# Patient Record
Sex: Male | Born: 1935 | Race: White | Hispanic: No | Smoking: Never smoker
Health system: Southern US, Community
[De-identification: ages and names within clinical notes are randomized; demographics above are authoritative.]

## PROBLEM LIST (undated history)

## (undated) DIAGNOSIS — R0600 Dyspnea, unspecified: Secondary | ICD-10-CM

## (undated) DIAGNOSIS — M169 Osteoarthritis of hip, unspecified: Secondary | ICD-10-CM

## (undated) DIAGNOSIS — N200 Calculus of kidney: Secondary | ICD-10-CM

## (undated) DIAGNOSIS — Z9289 Personal history of other medical treatment: Secondary | ICD-10-CM

## (undated) DIAGNOSIS — B029 Zoster without complications: Secondary | ICD-10-CM

## (undated) DIAGNOSIS — T8859XA Other complications of anesthesia, initial encounter: Secondary | ICD-10-CM

## (undated) DIAGNOSIS — M545 Low back pain, unspecified: Secondary | ICD-10-CM

## (undated) DIAGNOSIS — S32010A Wedge compression fracture of first lumbar vertebra, initial encounter for closed fracture: Secondary | ICD-10-CM

## (undated) DIAGNOSIS — E119 Type 2 diabetes mellitus without complications: Secondary | ICD-10-CM

## (undated) DIAGNOSIS — K56600 Partial intestinal obstruction, unspecified as to cause: Secondary | ICD-10-CM

## (undated) DIAGNOSIS — K5190 Ulcerative colitis, unspecified, without complications: Secondary | ICD-10-CM

## (undated) DIAGNOSIS — Z933 Colostomy status: Secondary | ICD-10-CM

## (undated) DIAGNOSIS — I480 Paroxysmal atrial fibrillation: Secondary | ICD-10-CM

## (undated) DIAGNOSIS — I5032 Chronic diastolic (congestive) heart failure: Secondary | ICD-10-CM

## (undated) DIAGNOSIS — M8580 Other specified disorders of bone density and structure, unspecified site: Secondary | ICD-10-CM

## (undated) DIAGNOSIS — N289 Disorder of kidney and ureter, unspecified: Secondary | ICD-10-CM

## (undated) DIAGNOSIS — K259 Gastric ulcer, unspecified as acute or chronic, without hemorrhage or perforation: Secondary | ICD-10-CM

## (undated) DIAGNOSIS — D649 Anemia, unspecified: Secondary | ICD-10-CM

## (undated) DIAGNOSIS — N183 Chronic kidney disease, stage 3 unspecified: Secondary | ICD-10-CM

## (undated) DIAGNOSIS — E669 Obesity, unspecified: Secondary | ICD-10-CM

## (undated) DIAGNOSIS — T7840XA Allergy, unspecified, initial encounter: Secondary | ICD-10-CM

## (undated) DIAGNOSIS — I10 Essential (primary) hypertension: Secondary | ICD-10-CM

## (undated) DIAGNOSIS — I4892 Unspecified atrial flutter: Secondary | ICD-10-CM

## (undated) DIAGNOSIS — Z87448 Personal history of other diseases of urinary system: Secondary | ICD-10-CM

## (undated) DIAGNOSIS — T4145XA Adverse effect of unspecified anesthetic, initial encounter: Secondary | ICD-10-CM

## (undated) DIAGNOSIS — K219 Gastro-esophageal reflux disease without esophagitis: Secondary | ICD-10-CM

## (undated) DIAGNOSIS — I493 Ventricular premature depolarization: Secondary | ICD-10-CM

## (undated) DIAGNOSIS — E785 Hyperlipidemia, unspecified: Secondary | ICD-10-CM

## (undated) DIAGNOSIS — Z87442 Personal history of urinary calculi: Secondary | ICD-10-CM

## (undated) DIAGNOSIS — I420 Dilated cardiomyopathy: Secondary | ICD-10-CM

## (undated) DIAGNOSIS — Z8711 Personal history of peptic ulcer disease: Secondary | ICD-10-CM

## (undated) DIAGNOSIS — H3530 Unspecified macular degeneration: Secondary | ICD-10-CM

## (undated) DIAGNOSIS — I2510 Atherosclerotic heart disease of native coronary artery without angina pectoris: Secondary | ICD-10-CM

## (undated) HISTORY — DX: Atherosclerotic heart disease of native coronary artery without angina pectoris: I25.10

## (undated) HISTORY — PX: TONSILLECTOMY: SUR1361

## (undated) HISTORY — DX: Ulcerative colitis, unspecified, without complications: K51.90

## (undated) HISTORY — DX: Anemia, unspecified: D64.9

## (undated) HISTORY — PX: COLON RESECTION: SHX5231

## (undated) HISTORY — DX: Calculus of kidney: N20.0

## (undated) HISTORY — DX: Personal history of other diseases of urinary system: Z87.448

## (undated) HISTORY — DX: Gastric ulcer, unspecified as acute or chronic, without hemorrhage or perforation: K25.9

## (undated) HISTORY — DX: Obesity, unspecified: E66.9

## (undated) HISTORY — DX: Low back pain, unspecified: M54.50

## (undated) HISTORY — PX: BACK SURGERY: SHX140

## (undated) HISTORY — DX: Paroxysmal atrial fibrillation: I48.0

## (undated) HISTORY — DX: Dilated cardiomyopathy: I42.0

## (undated) HISTORY — DX: Gastro-esophageal reflux disease without esophagitis: K21.9

## (undated) HISTORY — DX: Type 2 diabetes mellitus without complications: E11.9

## (undated) HISTORY — DX: Chronic kidney disease, stage 3 unspecified: N18.30

## (undated) HISTORY — DX: Unspecified macular degeneration: H35.30

## (undated) HISTORY — DX: Osteoarthritis of hip, unspecified: M16.9

## (undated) HISTORY — DX: Wedge compression fracture of first lumbar vertebra, initial encounter for closed fracture: S32.010A

## (undated) HISTORY — DX: Partial intestinal obstruction, unspecified as to cause: K56.600

## (undated) HISTORY — PX: COLOSTOMY: SHX63

## (undated) HISTORY — DX: Personal history of peptic ulcer disease: Z87.11

## (undated) HISTORY — PX: CORONARY ANGIOPLASTY: SHX604

## (undated) HISTORY — DX: Unspecified atrial flutter: I48.92

## (undated) HISTORY — PX: HERNIA REPAIR: SHX51

## (undated) HISTORY — DX: Zoster without complications: B02.9

## (undated) HISTORY — DX: Low back pain: M54.5

## (undated) HISTORY — DX: Other specified disorders of bone density and structure, unspecified site: M85.80

## (undated) HISTORY — DX: Chronic kidney disease, stage 3 (moderate): N18.3

## (undated) HISTORY — DX: Hyperlipidemia, unspecified: E78.5

## (undated) HISTORY — DX: Essential (primary) hypertension: I10

## (undated) HISTORY — DX: Ventricular premature depolarization: I49.3

## (undated) HISTORY — DX: Chronic diastolic (congestive) heart failure: I50.32

## (undated) HISTORY — DX: Disorder of kidney and ureter, unspecified: N28.9

---

## 2000-05-14 ENCOUNTER — Inpatient Hospital Stay (HOSPITAL_COMMUNITY): Admission: EM | Admit: 2000-05-14 | Discharge: 2000-05-16 | Payer: Self-pay | Admitting: *Deleted

## 2003-10-28 ENCOUNTER — Emergency Department (HOSPITAL_COMMUNITY): Admission: AD | Admit: 2003-10-28 | Discharge: 2003-10-28 | Payer: Self-pay | Admitting: Family Medicine

## 2007-10-18 DIAGNOSIS — K56600 Partial intestinal obstruction, unspecified as to cause: Secondary | ICD-10-CM

## 2007-10-18 HISTORY — DX: Partial intestinal obstruction, unspecified as to cause: K56.600

## 2008-02-15 DIAGNOSIS — N200 Calculus of kidney: Secondary | ICD-10-CM

## 2008-02-15 HISTORY — DX: Calculus of kidney: N20.0

## 2008-03-02 ENCOUNTER — Ambulatory Visit: Payer: Self-pay | Admitting: Internal Medicine

## 2008-03-02 ENCOUNTER — Inpatient Hospital Stay (HOSPITAL_COMMUNITY): Admission: EM | Admit: 2008-03-02 | Discharge: 2008-03-05 | Payer: Self-pay | Admitting: Emergency Medicine

## 2008-03-04 ENCOUNTER — Encounter: Payer: Self-pay | Admitting: Cardiology

## 2008-03-05 ENCOUNTER — Encounter (INDEPENDENT_AMBULATORY_CARE_PROVIDER_SITE_OTHER): Payer: Self-pay | Admitting: Urology

## 2008-05-22 ENCOUNTER — Inpatient Hospital Stay (HOSPITAL_COMMUNITY): Admission: EM | Admit: 2008-05-22 | Discharge: 2008-05-28 | Payer: Self-pay | Admitting: *Deleted

## 2008-05-22 ENCOUNTER — Ambulatory Visit: Payer: Self-pay | Admitting: Internal Medicine

## 2008-05-28 ENCOUNTER — Encounter: Payer: Self-pay | Admitting: Internal Medicine

## 2008-06-18 ENCOUNTER — Ambulatory Visit: Payer: Self-pay | Admitting: Internal Medicine

## 2008-06-18 LAB — CONVERTED CEMR LAB
BUN: 31 mg/dL — ABNORMAL HIGH (ref 6–23)
Basophils Absolute: 0.1 10*3/uL (ref 0.0–0.1)
Basophils Relative: 1.5 % (ref 0.0–3.0)
CO2: 26 meq/L (ref 19–32)
Calcium: 9.2 mg/dL (ref 8.4–10.5)
Chloride: 110 meq/L (ref 96–112)
Creatinine, Ser: 1.4 mg/dL (ref 0.4–1.5)
Eosinophils Absolute: 0.5 10*3/uL (ref 0.0–0.7)
Eosinophils Relative: 5.9 % — ABNORMAL HIGH (ref 0.0–5.0)
GFR calc Af Amer: 64 mL/min
GFR calc non Af Amer: 53 mL/min
Glucose, Bld: 78 mg/dL (ref 70–99)
HCT: 34.3 % — ABNORMAL LOW (ref 39.0–52.0)
Hemoglobin: 11.8 g/dL — ABNORMAL LOW (ref 13.0–17.0)
INR: 2 — ABNORMAL HIGH (ref 0.8–1.0)
Lymphocytes Relative: 17.7 % (ref 12.0–46.0)
MCHC: 34.3 g/dL (ref 30.0–36.0)
MCV: 88.6 fL (ref 78.0–100.0)
Monocytes Absolute: 0.7 10*3/uL (ref 0.1–1.0)
Monocytes Relative: 8.1 % (ref 3.0–12.0)
Neutro Abs: 5.4 10*3/uL (ref 1.4–7.7)
Neutrophils Relative %: 66.8 % (ref 43.0–77.0)
Platelets: 339 10*3/uL (ref 150–400)
Potassium: 4.7 meq/L (ref 3.5–5.1)
Prothrombin Time: 22.4 s — ABNORMAL HIGH (ref 10.9–13.3)
RBC: 3.87 M/uL — ABNORMAL LOW (ref 4.22–5.81)
RDW: 15.1 % — ABNORMAL HIGH (ref 11.5–14.6)
Sodium: 138 meq/L (ref 135–145)
WBC: 8.2 10*3/uL (ref 4.5–10.5)
aPTT: 48.6 s — ABNORMAL HIGH (ref 21.7–29.8)

## 2008-06-26 ENCOUNTER — Ambulatory Visit: Payer: Self-pay | Admitting: Internal Medicine

## 2008-06-26 ENCOUNTER — Inpatient Hospital Stay (HOSPITAL_COMMUNITY): Admission: AD | Admit: 2008-06-26 | Discharge: 2008-06-27 | Payer: Self-pay | Admitting: Internal Medicine

## 2008-08-07 ENCOUNTER — Ambulatory Visit: Payer: Self-pay | Admitting: Internal Medicine

## 2008-10-05 ENCOUNTER — Encounter: Admission: RE | Admit: 2008-10-05 | Discharge: 2008-10-05 | Payer: Self-pay | Admitting: Internal Medicine

## 2008-10-15 ENCOUNTER — Encounter: Admission: RE | Admit: 2008-10-15 | Discharge: 2008-10-15 | Payer: Self-pay | Admitting: Internal Medicine

## 2008-10-23 ENCOUNTER — Encounter: Admission: RE | Admit: 2008-10-23 | Discharge: 2008-10-23 | Payer: Self-pay | Admitting: Internal Medicine

## 2010-01-02 ENCOUNTER — Inpatient Hospital Stay (HOSPITAL_COMMUNITY): Admission: EM | Admit: 2010-01-02 | Discharge: 2010-01-07 | Payer: Self-pay | Admitting: Pulmonary Disease

## 2010-01-02 ENCOUNTER — Encounter: Payer: Self-pay | Admitting: Emergency Medicine

## 2010-01-02 ENCOUNTER — Ambulatory Visit: Payer: Self-pay | Admitting: Emergency Medicine

## 2010-04-05 ENCOUNTER — Encounter: Admission: RE | Admit: 2010-04-05 | Discharge: 2010-04-05 | Payer: Self-pay | Admitting: Internal Medicine

## 2010-11-16 NOTE — Procedures (Signed)
Summary: Endoscopy   EGD  Procedure date:  05/27/2008  Findings:      Location: [facility]    Patient Name: [name] [name], Study MRN: [id number] Procedure Procedures: Panendoscopy (EGD) CPT: 07615.  Personnel: Endoscopist: Gatha Mayer, MD, [facility].  Exam Location: Exam performed in Endoscopy Suite.  Patient Consent: Procedure, Alternatives, Risks and Benefits discussed, consent obtained, from patient. Consent was obtained by the RN.  Indications  Evaluation of: Anemia,  with low iron saturation. Positive fecal occult blood test  History  Current Medications: Patient is currently taking Coumadin. AC Plan: Continue Coumadin.  Comments: Heparin held Is being anticoagulated for Afib/Flutter Pre-Exam Physical: Performed May 27, 2008  Cardio-pulmonary exam abnormal. HEENT exam, Abdominal exam, Mental status exam WNL. Abnormal PE findings include: Afib/fluter.  Comments: Pt. history reviewed/updated, physical exam performed prior to initiation of sedation? YES Exam Exam Info: Maximum depth of insertion Duodenum, intended Duodenum. Patient position: on left side. Gastric retroflexion performed. Images taken. ASA Classification: III.  Sedation Meds: Patient assessed and found to be appropriate for moderate (conscious) sedation. Fentanyl 50 mcg. given IV. Versed 4 mg. given IV. Cetacaine Spray 2 sprays given aerosolized.  Monitoring: BP and pulse monitoring done. Oximetry used. Supplemental O2 given  Fluoroscopy: Fluoroscopy was not used.  Findings - Normal: Proximal Esophagus to Duodenal 2nd Portion.   Assessment  Comments: NORMAL EGD NO CAUSE OF HEME + STOOL OR ANEMIA SEEN SUSPECT ANEMIA MULTIFACTORIAL, CHRONIC DISEASE AND IRON DEFICIENCY Events  Unplanned Intervention: No unplanned interventions were required.  Plans Comments: IRON SUPPLEMENTS Disposition: After procedure patient sent to recovery.  Scheduling: Follow-up prn.  Comments: IF THERE  IS EVIDENCE OF GI HEMORRHAGE OR FAILS TO RESPOND TO IRON A CAPSULE ENDOSCOPY OF SMALL BOWEL COULD BE NECESSARY BUT WOULD NOT DO AT THIS TIME   cc: [name], MD [facility]     [name], MD   This report was created from the original endoscopy report, which was reviewed and signed by the above listed endoscopist.

## 2011-01-07 LAB — CULTURE, BLOOD (ROUTINE X 2)
Culture: NO GROWTH
Culture: NO GROWTH

## 2011-01-07 LAB — DIFFERENTIAL
Basophils Absolute: 0 10*3/uL (ref 0.0–0.1)
Basophils Relative: 0 % (ref 0–1)
Eosinophils Absolute: 1.1 10*3/uL — ABNORMAL HIGH (ref 0.0–0.7)
Eosinophils Relative: 7 % — ABNORMAL HIGH (ref 0–5)
Lymphocytes Relative: 10 % — ABNORMAL LOW (ref 12–46)
Lymphs Abs: 1.7 10*3/uL (ref 0.7–4.0)
Monocytes Absolute: 1.2 10*3/uL — ABNORMAL HIGH (ref 0.1–1.0)
Monocytes Relative: 7 % (ref 3–12)
Neutro Abs: 12.4 10*3/uL — ABNORMAL HIGH (ref 1.7–7.7)
Neutrophils Relative %: 76 % (ref 43–77)

## 2011-01-07 LAB — BLOOD GAS, ARTERIAL
Acid-base deficit: 12.7 mmol/L — ABNORMAL HIGH (ref 0.0–2.0)
Bicarbonate: 12.9 mEq/L — ABNORMAL LOW (ref 20.0–24.0)
O2 Content: 2 L/min
O2 Saturation: 98 %
Patient temperature: 98.6
TCO2: 12.2 mmol/L (ref 0–100)
pCO2 arterial: 29.8 mmHg — ABNORMAL LOW (ref 35.0–45.0)
pH, Arterial: 7.259 — ABNORMAL LOW (ref 7.350–7.450)
pO2, Arterial: 115 mmHg — ABNORMAL HIGH (ref 80.0–100.0)

## 2011-01-07 LAB — POCT CARDIAC MARKERS
CKMB, poc: 1.1 ng/mL (ref 1.0–8.0)
CKMB, poc: 1.2 ng/mL (ref 1.0–8.0)
Myoglobin, poc: >500 ng/mL (ref 12–200)
Myoglobin, poc: >500 ng/mL (ref 12–200)
Troponin i, poc: <0.05 ng/mL (ref 0.00–0.09)
Troponin i, poc: <0.05 ng/mL (ref 0.00–0.09)

## 2011-01-07 LAB — BASIC METABOLIC PANEL
BUN: 83 mg/dL — ABNORMAL HIGH (ref 6–23)
CO2: 16 mEq/L — ABNORMAL LOW (ref 19–32)
Calcium: 9 mg/dL (ref 8.4–10.5)
Chloride: 109 mEq/L (ref 96–112)
Creatinine, Ser: 8.53 mg/dL — ABNORMAL HIGH (ref 0.4–1.5)
GFR calc Af Amer: 7 mL/min — ABNORMAL LOW (ref >60)
GFR calc non Af Amer: 6 mL/min — ABNORMAL LOW (ref >60)
Glucose, Bld: 103 mg/dL — ABNORMAL HIGH (ref 70–99)
Potassium: 5.9 mEq/L — ABNORMAL HIGH (ref 3.5–5.1)
Sodium: 135 mEq/L (ref 135–145)

## 2011-01-07 LAB — URINE CULTURE
Colony Count: NO GROWTH
Culture: NO GROWTH

## 2011-01-07 LAB — CBC
HCT: 38.1 % — ABNORMAL LOW (ref 39.0–52.0)
Hemoglobin: 12.5 g/dL — ABNORMAL LOW (ref 13.0–17.0)
MCHC: 32.9 g/dL (ref 30.0–36.0)
MCV: 88.2 fL (ref 78.0–100.0)
Platelets: 404 10*3/uL — ABNORMAL HIGH (ref 150–400)
RBC: 4.32 MIL/uL (ref 4.22–5.81)
RDW: 14.3 % (ref 11.5–15.5)
WBC: 16.4 10*3/uL — ABNORMAL HIGH (ref 4.0–10.5)

## 2011-01-07 LAB — POCT I-STAT, CHEM 8
BUN: 92 mg/dL — ABNORMAL HIGH (ref 6–23)
Calcium, Ion: 1.25 mmol/L (ref 1.12–1.32)
Chloride: 112 mEq/L (ref 96–112)
Creatinine, Ser: 9.5 mg/dL — ABNORMAL HIGH (ref 0.4–1.5)
Glucose, Bld: 212 mg/dL — ABNORMAL HIGH (ref 70–99)
HCT: 41 % (ref 39.0–52.0)
Hemoglobin: 13.9 g/dL (ref 13.0–17.0)
Potassium: 6.2 mEq/L — ABNORMAL HIGH (ref 3.5–5.1)
Sodium: 132 mEq/L — ABNORMAL LOW (ref 135–145)
TCO2: 15 mmol/L (ref 0–100)

## 2011-01-07 LAB — HEPATIC FUNCTION PANEL
ALT: 12 U/L (ref 0–53)
AST: 17 U/L (ref 0–37)
Albumin: 3.4 g/dL — ABNORMAL LOW (ref 3.5–5.2)
Alkaline Phosphatase: 102 U/L (ref 39–117)
Bilirubin, Direct: 0.1 mg/dL (ref 0.0–0.3)
Indirect Bilirubin: 0.4 mg/dL (ref 0.3–0.9)
Total Bilirubin: 0.5 mg/dL (ref 0.3–1.2)
Total Protein: 8.1 g/dL (ref 6.0–8.3)

## 2011-01-07 LAB — URINALYSIS, ROUTINE W REFLEX MICROSCOPIC
Glucose, UA: 100 mg/dL — AB
Nitrite: NEGATIVE
Protein, ur: >300 mg/dL — AB
Specific Gravity, Urine: 1.023 (ref 1.005–1.030)
Urobilinogen, UA: 0.2 mg/dL (ref 0.0–1.0)
pH: 5.5 (ref 5.0–8.0)

## 2011-01-07 LAB — TYPE AND SCREEN
ABO/RH(D): A POS
Antibody Screen: NEGATIVE

## 2011-01-07 LAB — GLUCOSE, CAPILLARY
Glucose-Capillary: 104 mg/dL — ABNORMAL HIGH (ref 70–99)
Glucose-Capillary: 223 mg/dL — ABNORMAL HIGH (ref 70–99)

## 2011-01-07 LAB — PROTIME-INR
INR: 1.18 (ref 0.00–1.49)
Prothrombin Time: 14.9 seconds (ref 11.6–15.2)

## 2011-01-07 LAB — LACTIC ACID, PLASMA
Lactic Acid, Venous: 3.2 mmol/L — ABNORMAL HIGH (ref 0.5–2.2)
Lactic Acid, Venous: 3.3 mmol/L — ABNORMAL HIGH (ref 0.5–2.2)

## 2011-01-07 LAB — MAGNESIUM: Magnesium: 2.5 mg/dL (ref 1.5–2.5)

## 2011-01-07 LAB — URINE MICROSCOPIC-ADD ON

## 2011-01-07 LAB — ABO/RH: ABO/RH(D): A POS

## 2011-01-07 LAB — POTASSIUM: Potassium: 6.3 mEq/L (ref 3.5–5.1)

## 2011-01-09 LAB — GLUCOSE, CAPILLARY
Glucose-Capillary: 103 mg/dL — ABNORMAL HIGH (ref 70–99)
Glucose-Capillary: 103 mg/dL — ABNORMAL HIGH (ref 70–99)
Glucose-Capillary: 105 mg/dL — ABNORMAL HIGH (ref 70–99)
Glucose-Capillary: 111 mg/dL — ABNORMAL HIGH (ref 70–99)
Glucose-Capillary: 123 mg/dL — ABNORMAL HIGH (ref 70–99)
Glucose-Capillary: 133 mg/dL — ABNORMAL HIGH (ref 70–99)
Glucose-Capillary: 134 mg/dL — ABNORMAL HIGH (ref 70–99)
Glucose-Capillary: 142 mg/dL — ABNORMAL HIGH (ref 70–99)
Glucose-Capillary: 150 mg/dL — ABNORMAL HIGH (ref 70–99)
Glucose-Capillary: 156 mg/dL — ABNORMAL HIGH (ref 70–99)
Glucose-Capillary: 173 mg/dL — ABNORMAL HIGH (ref 70–99)
Glucose-Capillary: 173 mg/dL — ABNORMAL HIGH (ref 70–99)
Glucose-Capillary: 182 mg/dL — ABNORMAL HIGH (ref 70–99)
Glucose-Capillary: 190 mg/dL — ABNORMAL HIGH (ref 70–99)
Glucose-Capillary: 192 mg/dL — ABNORMAL HIGH (ref 70–99)
Glucose-Capillary: 211 mg/dL — ABNORMAL HIGH (ref 70–99)
Glucose-Capillary: 214 mg/dL — ABNORMAL HIGH (ref 70–99)
Glucose-Capillary: 242 mg/dL — ABNORMAL HIGH (ref 70–99)
Glucose-Capillary: 353 mg/dL — ABNORMAL HIGH (ref 70–99)
Glucose-Capillary: 50 mg/dL — ABNORMAL LOW (ref 70–99)
Glucose-Capillary: 83 mg/dL (ref 70–99)

## 2011-01-09 LAB — POCT I-STAT 3, ART BLOOD GAS (G3+)
Acid-Base Excess: 4 mmol/L — ABNORMAL HIGH (ref 0.0–2.0)
Acid-Base Excess: 7 mmol/L — ABNORMAL HIGH (ref 0.0–2.0)
Acid-base deficit: 12 mmol/L — ABNORMAL HIGH (ref 0.0–2.0)
Bicarbonate: 14.4 mEq/L — ABNORMAL LOW (ref 20.0–24.0)
Bicarbonate: 24 mEq/L (ref 20.0–24.0)
Bicarbonate: 28 mEq/L — ABNORMAL HIGH (ref 20.0–24.0)
Bicarbonate: 30.2 mEq/L — ABNORMAL HIGH (ref 20.0–24.0)
O2 Saturation: 96 %
O2 Saturation: 96 %
O2 Saturation: 96 %
O2 Saturation: 98 %
Patient temperature: 97.5
Patient temperature: 98.1
Patient temperature: 98.6
TCO2: 15 mmol/L (ref 0–100)
TCO2: 25 mmol/L (ref 0–100)
TCO2: 29 mmol/L (ref 0–100)
TCO2: 31 mmol/L (ref 0–100)
pCO2 arterial: 33.5 mmHg — ABNORMAL LOW (ref 35.0–45.0)
pCO2 arterial: 35.1 mmHg (ref 35.0–45.0)
pCO2 arterial: 35.3 mmHg (ref 35.0–45.0)
pCO2 arterial: 36.9 mmHg (ref 35.0–45.0)
pH, Arterial: 7.242 — ABNORMAL LOW (ref 7.350–7.450)
pH, Arterial: 7.44 (ref 7.350–7.450)
pH, Arterial: 7.487 — ABNORMAL HIGH (ref 7.350–7.450)
pH, Arterial: 7.54 — ABNORMAL HIGH (ref 7.350–7.450)
pO2, Arterial: 125 mmHg — ABNORMAL HIGH (ref 80.0–100.0)
pO2, Arterial: 72 mmHg — ABNORMAL LOW (ref 80.0–100.0)
pO2, Arterial: 75 mmHg — ABNORMAL LOW (ref 80.0–100.0)
pO2, Arterial: 78 mmHg — ABNORMAL LOW (ref 80.0–100.0)

## 2011-01-09 LAB — CARDIAC PANEL(CRET KIN+CKTOT+MB+TROPI)
CK, MB: 1.4 ng/mL (ref 0.3–4.0)
Relative Index: INVALID (ref 0.0–2.5)
Total CK: 72 U/L (ref 7–232)
Troponin I: 0.02 ng/mL (ref 0.00–0.06)

## 2011-01-09 LAB — CBC
HCT: 27.2 % — ABNORMAL LOW (ref 39.0–52.0)
HCT: 28 % — ABNORMAL LOW (ref 39.0–52.0)
HCT: 29.9 % — ABNORMAL LOW (ref 39.0–52.0)
HCT: 30.9 % — ABNORMAL LOW (ref 39.0–52.0)
HCT: 32.6 % — ABNORMAL LOW (ref 39.0–52.0)
Hemoglobin: 10.3 g/dL — ABNORMAL LOW (ref 13.0–17.0)
Hemoglobin: 10.6 g/dL — ABNORMAL LOW (ref 13.0–17.0)
Hemoglobin: 11 g/dL — ABNORMAL LOW (ref 13.0–17.0)
Hemoglobin: 9.1 g/dL — ABNORMAL LOW (ref 13.0–17.0)
Hemoglobin: 9.5 g/dL — ABNORMAL LOW (ref 13.0–17.0)
MCHC: 33.5 g/dL (ref 30.0–36.0)
MCHC: 33.7 g/dL (ref 30.0–36.0)
MCHC: 34 g/dL (ref 30.0–36.0)
MCHC: 34.2 g/dL (ref 30.0–36.0)
MCHC: 34.4 g/dL (ref 30.0–36.0)
MCV: 87.4 fL (ref 78.0–100.0)
MCV: 87.4 fL (ref 78.0–100.0)
MCV: 87.4 fL (ref 78.0–100.0)
MCV: 88.3 fL (ref 78.0–100.0)
MCV: 88.5 fL (ref 78.0–100.0)
Platelets: 169 10*3/uL (ref 150–400)
Platelets: 186 10*3/uL (ref 150–400)
Platelets: 198 10*3/uL (ref 150–400)
Platelets: 211 10*3/uL (ref 150–400)
Platelets: 228 10*3/uL (ref 150–400)
RBC: 3.11 MIL/uL — ABNORMAL LOW (ref 4.22–5.81)
RBC: 3.21 MIL/uL — ABNORMAL LOW (ref 4.22–5.81)
RBC: 3.43 MIL/uL — ABNORMAL LOW (ref 4.22–5.81)
RBC: 3.5 MIL/uL — ABNORMAL LOW (ref 4.22–5.81)
RBC: 3.69 MIL/uL — ABNORMAL LOW (ref 4.22–5.81)
RDW: 13.6 % (ref 11.5–15.5)
RDW: 13.8 % (ref 11.5–15.5)
RDW: 13.9 % (ref 11.5–15.5)
RDW: 13.9 % (ref 11.5–15.5)
RDW: 14 % (ref 11.5–15.5)
WBC: 11.6 10*3/uL — ABNORMAL HIGH (ref 4.0–10.5)
WBC: 12.3 10*3/uL — ABNORMAL HIGH (ref 4.0–10.5)
WBC: 14.2 10*3/uL — ABNORMAL HIGH (ref 4.0–10.5)
WBC: 14.6 10*3/uL — ABNORMAL HIGH (ref 4.0–10.5)
WBC: 8.8 10*3/uL (ref 4.0–10.5)

## 2011-01-09 LAB — RENAL FUNCTION PANEL
Albumin: 2 g/dL — ABNORMAL LOW (ref 3.5–5.2)
Albumin: 2.1 g/dL — ABNORMAL LOW (ref 3.5–5.2)
Albumin: 2.3 g/dL — ABNORMAL LOW (ref 3.5–5.2)
Albumin: 2.4 g/dL — ABNORMAL LOW (ref 3.5–5.2)
Albumin: 2.5 g/dL — ABNORMAL LOW (ref 3.5–5.2)
BUN: 19 mg/dL (ref 6–23)
BUN: 23 mg/dL (ref 6–23)
BUN: 36 mg/dL — ABNORMAL HIGH (ref 6–23)
BUN: 48 mg/dL — ABNORMAL HIGH (ref 6–23)
BUN: 51 mg/dL — ABNORMAL HIGH (ref 6–23)
CO2: 22 mEq/L (ref 19–32)
CO2: 22 mEq/L (ref 19–32)
CO2: 25 mEq/L (ref 19–32)
CO2: 29 mEq/L (ref 19–32)
CO2: 30 mEq/L (ref 19–32)
Calcium: 6.7 mg/dL — ABNORMAL LOW (ref 8.4–10.5)
Calcium: 6.7 mg/dL — ABNORMAL LOW (ref 8.4–10.5)
Calcium: 6.8 mg/dL — ABNORMAL LOW (ref 8.4–10.5)
Calcium: 7 mg/dL — ABNORMAL LOW (ref 8.4–10.5)
Calcium: 7.2 mg/dL — ABNORMAL LOW (ref 8.4–10.5)
Chloride: 104 mEq/L (ref 96–112)
Chloride: 106 mEq/L (ref 96–112)
Chloride: 108 mEq/L (ref 96–112)
Chloride: 93 mEq/L — ABNORMAL LOW (ref 96–112)
Chloride: 97 mEq/L (ref 96–112)
Creatinine, Ser: 1.89 mg/dL — ABNORMAL HIGH (ref 0.4–1.5)
Creatinine, Ser: 2.26 mg/dL — ABNORMAL HIGH (ref 0.4–1.5)
Creatinine, Ser: 3.67 mg/dL — ABNORMAL HIGH (ref 0.4–1.5)
Creatinine, Ser: 5.92 mg/dL — ABNORMAL HIGH (ref 0.4–1.5)
Creatinine, Ser: 6.26 mg/dL — ABNORMAL HIGH (ref 0.4–1.5)
GFR calc Af Amer: 11 mL/min — ABNORMAL LOW (ref >60)
GFR calc Af Amer: 11 mL/min — ABNORMAL LOW (ref >60)
GFR calc Af Amer: 20 mL/min — ABNORMAL LOW (ref >60)
GFR calc Af Amer: 34 mL/min — ABNORMAL LOW (ref >60)
GFR calc Af Amer: 42 mL/min — ABNORMAL LOW (ref >60)
GFR calc non Af Amer: 16 mL/min — ABNORMAL LOW (ref >60)
GFR calc non Af Amer: 29 mL/min — ABNORMAL LOW (ref >60)
GFR calc non Af Amer: 35 mL/min — ABNORMAL LOW (ref >60)
GFR calc non Af Amer: 9 mL/min — ABNORMAL LOW (ref >60)
GFR calc non Af Amer: 9 mL/min — ABNORMAL LOW (ref >60)
Glucose, Bld: 126 mg/dL — ABNORMAL HIGH (ref 70–99)
Glucose, Bld: 127 mg/dL — ABNORMAL HIGH (ref 70–99)
Glucose, Bld: 137 mg/dL — ABNORMAL HIGH (ref 70–99)
Glucose, Bld: 143 mg/dL — ABNORMAL HIGH (ref 70–99)
Glucose, Bld: 205 mg/dL — ABNORMAL HIGH (ref 70–99)
Phosphorus: 2.8 mg/dL (ref 2.3–4.6)
Phosphorus: 2.8 mg/dL (ref 2.3–4.6)
Phosphorus: 3 mg/dL (ref 2.3–4.6)
Phosphorus: 3.7 mg/dL (ref 2.3–4.6)
Phosphorus: 3.8 mg/dL (ref 2.3–4.6)
Potassium: 3.9 mEq/L (ref 3.5–5.1)
Potassium: 4 mEq/L (ref 3.5–5.1)
Potassium: 4.1 mEq/L (ref 3.5–5.1)
Potassium: 4.3 mEq/L (ref 3.5–5.1)
Potassium: 4.5 mEq/L (ref 3.5–5.1)
Sodium: 134 mEq/L — ABNORMAL LOW (ref 135–145)
Sodium: 136 mEq/L (ref 135–145)
Sodium: 137 mEq/L (ref 135–145)
Sodium: 138 mEq/L (ref 135–145)
Sodium: 139 mEq/L (ref 135–145)

## 2011-01-09 LAB — BASIC METABOLIC PANEL
BUN: 43 mg/dL — ABNORMAL HIGH (ref 6–23)
BUN: 48 mg/dL — ABNORMAL HIGH (ref 6–23)
BUN: 59 mg/dL — ABNORMAL HIGH (ref 6–23)
BUN: 77 mg/dL — ABNORMAL HIGH (ref 6–23)
CO2: 16 mEq/L — ABNORMAL LOW (ref 19–32)
CO2: 24 mEq/L (ref 19–32)
CO2: 27 mEq/L (ref 19–32)
CO2: 32 mEq/L (ref 19–32)
Calcium: 6.8 mg/dL — ABNORMAL LOW (ref 8.4–10.5)
Calcium: 6.9 mg/dL — ABNORMAL LOW (ref 8.4–10.5)
Calcium: 7.2 mg/dL — ABNORMAL LOW (ref 8.4–10.5)
Calcium: 8.4 mg/dL (ref 8.4–10.5)
Chloride: 103 mEq/L (ref 96–112)
Chloride: 108 mEq/L (ref 96–112)
Chloride: 93 mEq/L — ABNORMAL LOW (ref 96–112)
Chloride: 97 mEq/L (ref 96–112)
Creatinine, Ser: 5.06 mg/dL — ABNORMAL HIGH (ref 0.4–1.5)
Creatinine, Ser: 6 mg/dL — ABNORMAL HIGH (ref 0.4–1.5)
Creatinine, Ser: 6.66 mg/dL — ABNORMAL HIGH (ref 0.4–1.5)
Creatinine, Ser: 8.58 mg/dL — ABNORMAL HIGH (ref 0.4–1.5)
GFR calc Af Amer: 10 mL/min — ABNORMAL LOW (ref >60)
GFR calc Af Amer: 11 mL/min — ABNORMAL LOW (ref >60)
GFR calc Af Amer: 14 mL/min — ABNORMAL LOW (ref >60)
GFR calc Af Amer: 7 mL/min — ABNORMAL LOW (ref >60)
GFR calc non Af Amer: 11 mL/min — ABNORMAL LOW (ref >60)
GFR calc non Af Amer: 6 mL/min — ABNORMAL LOW (ref >60)
GFR calc non Af Amer: 8 mL/min — ABNORMAL LOW (ref >60)
GFR calc non Af Amer: 9 mL/min — ABNORMAL LOW (ref >60)
Glucose, Bld: 105 mg/dL — ABNORMAL HIGH (ref 70–99)
Glucose, Bld: 172 mg/dL — ABNORMAL HIGH (ref 70–99)
Glucose, Bld: 81 mg/dL (ref 70–99)
Glucose, Bld: 92 mg/dL (ref 70–99)
Potassium: 3.6 mEq/L (ref 3.5–5.1)
Potassium: 4 mEq/L (ref 3.5–5.1)
Potassium: 4.1 mEq/L (ref 3.5–5.1)
Potassium: 6 mEq/L — ABNORMAL HIGH (ref 3.5–5.1)
Sodium: 136 mEq/L (ref 135–145)
Sodium: 137 mEq/L (ref 135–145)
Sodium: 137 mEq/L (ref 135–145)
Sodium: 139 mEq/L (ref 135–145)

## 2011-01-09 LAB — BRAIN NATRIURETIC PEPTIDE: Pro B Natriuretic peptide (BNP): 583 pg/mL — ABNORMAL HIGH (ref 0.0–100.0)

## 2011-01-09 LAB — MAGNESIUM
Magnesium: 1.2 mg/dL — ABNORMAL LOW (ref 1.5–2.5)
Magnesium: 1.4 mg/dL — ABNORMAL LOW (ref 1.5–2.5)
Magnesium: 1.5 mg/dL (ref 1.5–2.5)

## 2011-01-09 LAB — CLOSTRIDIUM DIFFICILE EIA: C difficile Toxins A+B, EIA: NEGATIVE

## 2011-01-09 LAB — BLOOD GAS, ARTERIAL
Acid-base deficit: 12.5 mmol/L — ABNORMAL HIGH (ref 0.0–2.0)
Bicarbonate: 13.8 mEq/L — ABNORMAL LOW (ref 20.0–24.0)
O2 Content: 2 L/min
O2 Saturation: 97.2 %
Patient temperature: 98.6
TCO2: 14.9 mmol/L (ref 0–100)
pCO2 arterial: 35.8 mmHg (ref 35.0–45.0)
pH, Arterial: 7.211 — ABNORMAL LOW (ref 7.350–7.450)
pO2, Arterial: 101 mmHg — ABNORMAL HIGH (ref 80.0–100.0)

## 2011-01-09 LAB — HEPATIC FUNCTION PANEL
ALT: 9 U/L (ref 0–53)
AST: 13 U/L (ref 0–37)
Albumin: 2.2 g/dL — ABNORMAL LOW (ref 3.5–5.2)
Alkaline Phosphatase: 76 U/L (ref 39–117)
Bilirubin, Direct: 0.2 mg/dL (ref 0.0–0.3)
Indirect Bilirubin: 0.3 mg/dL (ref 0.3–0.9)
Total Bilirubin: 0.5 mg/dL (ref 0.3–1.2)
Total Protein: 5.2 g/dL — ABNORMAL LOW (ref 6.0–8.3)

## 2011-01-09 LAB — APTT
aPTT: 100 seconds — ABNORMAL HIGH (ref 24–37)
aPTT: 48 seconds — ABNORMAL HIGH (ref 24–37)
aPTT: 57 seconds — ABNORMAL HIGH (ref 24–37)

## 2011-01-09 LAB — LACTIC ACID, PLASMA
Lactic Acid, Venous: 1.5 mmol/L (ref 0.5–2.2)
Lactic Acid, Venous: 3.1 mmol/L — ABNORMAL HIGH (ref 0.5–2.2)
Lactic Acid, Venous: 3.6 mmol/L — ABNORMAL HIGH (ref 0.5–2.2)

## 2011-01-09 LAB — MRSA PCR SCREENING: MRSA by PCR: POSITIVE — AB

## 2011-03-01 NOTE — Consult Note (Signed)
NAME:  [name] [name], [name] [name] NO.:  [id number]   MEDICAL RECORD NO.:  [id number]          PATIENT TYPE:  EMS   LOCATION:  MAJO                         FACILITY:  [place]   PHYSICIAN:  Mare Loan, MD      DATE OF BIRTH:  Jun 26, 1933   DATE OF CONSULTATION:  03/01/2008  DATE OF DISCHARGE:                                 CONSULTATION   REQUESTING PHYSICIAN:  [name] [name], [place]:  Abdominal pain, partial small-bowel  obstruction.   HISTORY OF PRESENT ILLNESS:  [name] [name] is a 77 year old male who has had  onset of generalized abdominal pain around 11 o'clock today.  He states  this is over his entire abdomen.  He had no aggravating or alleviating  factors.  He has been receiving medicines in the emergency room.  He had  no increase of pain with ambulation.  He did eat lunch today, and then  began having abdominal discomfort.  No emesis, but he did have nausea at  home.  He describes having one previous episode of obstructive-type  symptoms in the past; however, they alleviated themselves over on their  own accord.  He also is noted by his sister, who is present in the room,  to have some abdominal distention.  He also is stating to have pain in  the suprapubic region, in his bilateral groin area.  A KUB was done in  the emergency department, which revealed several small bowel loops,  which were dilated with air-fluid levels.  He reported not having any  gas or melanotic stools in ostomy bag from earlier today.   PAST MEDICAL HISTORY:  Significant for diabetes, gastroesophageal reflux  disease, coronary artery disease, history of gastritis, and history of  ulcerative colitis.   SURGICAL HISTORY:  He has had 2 surgeries due to his colon in the past.  Last one was in 1995 by Dr. Johnathan Hausen.   MEDICATIONS:  Include Actos, lisinopril, simvastatin, glyburide, and  metformin.   ALLERGIES:  Include PENICILLIN and SULFA.   REVIEW OF SYSTEMS:  Per  the patient's chart, no history of recent chest  pain, chest discomfort, or shortness of breath.  He is having no more  difficulty with urination, although he has a slow stream.  Neurological  system is negative.  No known chronic kidney problems.   SOCIAL HISTORY:  No alcohol, no drug abuse, and no tobacco use.  He  lives and takes care of his wife who is dependent on his care.   PHYSICAL EXAMINATION:  GENERAL:  He is obese, appears in his stated age  lying in bed.  VITAL SIGNS:  Temperature 97.1, blood pressure 148/73, and pulse 57.  HEENT:  Extraocular muscles are intact.  No scleral icterus is evident.  CHEST:  Clear to auscultation bilaterally.  CARDIOVASCULAR:  Regular rate and rhythm.  ABDOMEN:  Severely obese and soft.  It is difficult to ascertain,  whether or not there is distention.  His incision is without evidence of  a hernia.  The ostomy is pink and viable in  the right side of the  abdomen.  I did digitally manipulate the ostomy at the bedside with my  fifth digit.  This was rather tight, I was unable to reach the passage  due to the patient's body habitus.  No peritoneal signs on palpation and  he has had mild amount of stool in the ostomy bag.  There is abundant  adipose tissues in the groin region, but no evident hernia.   LABORATORY DATA:  White count 11.0, hemoglobin and hematocrit 11.9 and  75, BUN and creatinine 33 and 1.7, and potassium is 4.8.   ASSESSMENT/PLAN:  Partial small bowel obstruction with some air-fluid  level as noted on KUB.  Currently, there is no acute surgical issue.  I  will recommend medicine and admission with continuation of a surgical  consult.  If he does continue to have emesis or nausea, I will recommend  NG-tube for decompression.  Ambulate as much as possible.  His  hyperglycemia and diabetes needs to be controlled.  There is an issue of  possible chronic renal insufficiency due to increase in creatinine  level.  He is not aware of  any problems in the past with this.  We will  continue to follow with abdominal exam, and hopefully this will resolve  of its own accord.      Mare Loan, MD  Electronically Signed     ALA/MEDQ  D:  03/01/2008  T:  03/02/2008  Job:  [id number]   cc:   [name], MD  Isabel Caprice Hassell Done, MD

## 2011-03-01 NOTE — Assessment & Plan Note (Signed)
[name] [name]   [name] [name], [name] [name]                        MRN:          [id number]  DATE:08/07/2008                            DOB:          1933-03-01    [name] is seen following catheter ablation of atrial flutter.  Since  that time, he has had much improved sleeping and much less dyspnea on  exertion, although this is still somewhat limited.   His medications are notable for the intercurrent discontinuation of  Cardizem as well as warfarin.   PHYSICAL EXAMINATION:  VITAL SIGNS:  Today, his blood pressure is  100/60.  His pulse is 63.  His weight is 248.  LUNGS:  Clear.  HEART:  Sounds were regular.  EXTREMITIES:  Without edema.   Electrocardiogram demonstrated sinus rhythm with a rare PVC.  There is  an incomplete right bundle-branch block with 0.17/0.10/0.44.  The axis  was leftward at -20.   IMPRESSION:  1. Atrial flutter, status post ablation.  2. Normal left ventricular function with diastolic heart failure -      improved.  3. Diabetes.   [name] is stable.  I will plan to see him again at Dr. Theodosia Blender  request.     Deboraha Sprang, MD, [facility]  Electronically Signed    SCK/MedQ  DD: 08/07/2008  DT: 08/08/2008  Job #: [id number]

## 2011-03-01 NOTE — H&P (Signed)
NAME:  [name] [name], [name] [name] NO.:  [id number]   MEDICAL RECORD NO.:  [id number]          PATIENT TYPE:  EMS   LOCATION:  MAJO                         FACILITY:  [facility]   PHYSICIAN:  Cindie Laroche, MD      DATE OF BIRTH:  June 24, 1933   DATE OF ADMISSION:  03/01/2008  DATE OF DISCHARGE:                              HISTORY & PHYSICAL   PRIMARY CARE PHYSICIAN:  [name], MD   CHIEF COMPLAINT:  Abdominal pain, nausea.   HISTORY OF PRESENT ILLNESS:  Mr. Bonsall is 77 year old gentleman.  He  lives with his wife.  He has a history of ulcerative colitis status post  colectomy and colostomy bag placement about 25 years ago.  He started  having abdominal pain diffusely located over the upper and lower part of  the abdomen since this morning after he ate popcorn for breakfast.  His  abdominal pain is deep and feels like bloating in the abdomen.  Abdominal pain does not get worse or relieved with any factors like food  intake or bowel movements.  He also feels like he does not have any  bowel movements or any fluid collection in colostomy bag since this  morning.  He did not feel passing gas since this morning.  He has  associated nausea since this morning with abdominal pain.  He did not  have any vomiting or diarrhea.  He denied any fever, chills, shortness  of breath, chest pain, sputum or cough production and urinary  complaints.  He has a history of her small bowel obstruction many years  ago, about three to four episodes but no recent episodes in the last few  years.   PAST MEDICAL HISTORY:  1. Hypertension.  2. Diabetes mellitus type 2.  3. Gastroesophageal reflux disease.  4. Gastritis.  5. Coronary artery disease.  6. Colectomy  7. Colostomy bag.  8. Herniorrhaphy x3.  9. Back surgery.  10.Obesity.  11.Tonsillectomy.   SURGICAL HISTORY:  As per past medical history above.   SOCIAL HISTORY:  The patient lives in [place] with his wife.  No  recent alcohol,  drug, tobacco use.   FAMILY HISTORY:  Positive for coronary artery disease and diabetes  mellitus.   HOME MEDICATIONS:  1. Glyburide 10 mg b.i.d.  2. Metformin 1000 mg b.i.d.  3 . Simvastatin 20 mg daily.  1. Lisinopril 5 mg daily.  2. Actos 45 mg daily.   ALLERGIES:  UNKNOWN ALLERGY TO PENICILLIN, SULFUR DRUGS.   REVIEW OF SYSTEMS:  Positive as per HPI, otherwise negative.  Review of  systems done for 14 systems.   PHYSICAL EXAMINATION:  VITAL SIGNS:  Blood pressure 160/82, pulse 71,  respirations 16, temperature 97.4, oxygen saturation 97% on room air.  GENERAL:  Alert, oriented x3, laying in bed, not in acute distress.  HEENT:  Head normocephalic, nontraumatic.  Eyes: Pupils equally reactive  to light and accommodation.  Oral cavity, oral mucosa dry.  No thrush  noted.  NECK:  No thyromegaly or JVD.  CARDIOVASCULAR:  S1-S2 regular.  No murmur, rub, gallop.  RESPIRATORY SYSTEM:  Clear to auscultation bilaterally.  No wheezing,  rhonchi, crackles.  ABDOMEN:  Reduced bowel sounds present all over the abdomen.  Deep  tenderness present.  No superficial tenderness present.  No guarding, no  rigidity, no organomegaly noted.  Colostomy bag in the center which is  empty without any fluid or gas distention.  EXTREMITIES: No clubbing, cyanosis or edema.  Pulses palpable in all  four extremities.  Soft incisional hernia in the right lower quadrant  present.  NEUROLOGICAL:  Exam shows intact cranial nerves.  Muscular strength,  sensation.  SKIN:  No rash or bruits.  PSYCHIATRIC:  Exam shows alert, oriented person.  No depression noted.   LABORATORY DATA:  Comprehensive metabolic panel showing glucose 156, BUN  29, creatinine 1.66, albumin 3.20, GFR 41.  Abdominal x-ray with chest x-  ray shows increased caliber of small bowel loops with air-fluid levels.  Cannot rule out partial small bowel obstruction.  CBC with differential  shows WBC 11.1, hemoglobin 11.2, platelets 281.    ASSESSMENT AND PLAN:  1. Partial small-bowel obstruction.  2. Uncontrolled hypertension.  3. Diabetes mellitus uncontrolled.  4. Obesity.  5. Gastroesophageal reflux disease.  6. History of coronary artery disease.  7. History of colectomy and colostomy bag.  8. History of hernia.   PLAN:  Will admit the patient on medicine bed under Dr. [name]  with diagnosis of partial small-bowel obstruction and acute renal  insufficiency.  The patient is full code at this time.  The patient will  be n.p.o. except medications, ice chips and water.  Will check CBC, CMP,  magnesium, phosphate and abdominal x-ray two views in the morning.  Will  start IV fluid normal saline at 75 cc/hour.  We will check urine lytes,  urine creatinine and urinalysis now.  Will start IV sliding scale  insulin with a.c/h.s. scale one.  Will start IV Nexium and Lovenox for  GI and DVT prophylaxis, respectively.  With will start IV morphine 2  grams q. 2 h p.r.n. for pain along with Zofran 4 mg IV q. 4 h for nausea  and vomiting, along with Phenergan.  Will consult General Surgery, Dr.  Luetta Nutting Allen's  group in the morning.  ER physician, Dr. Rita Ohara, has consulted  General Surgery.  In the ER, Dr. Ronnald Collum has evaluated the patient  and thinks that the patient will be appropriately managed by the  medicine service.  If needed, we will put NG tube in the morning.  Further plan according to the workup and consult pending.      Cindie Laroche, MD  Electronically Signed     TVP/MEDQ  D:  03/02/2008  T:  03/02/2008  Job:  [id number]

## 2011-03-01 NOTE — Op Note (Signed)
NAME:  [name] [name], [name] [name] NO.:  [id number]   MEDICAL RECORD NO.:  [id number]          PATIENT TYPE:  AMB   LOCATION:  DAY                          FACILITY:  [facility]   PHYSICIAN:  Raynelle Bring, MD      DATE OF BIRTH:  11/18/1932   DATE OF PROCEDURE:  03/04/2008  DATE OF DISCHARGE:                               OPERATIVE REPORT   PREOPERATIVE DIAGNOSES:  1. Right hydronephrosis.  2. Hematuria.   POSTOPERATIVE DIAGNOSES:  1. Right hydronephrosis.  2. Hematuria.   PROCEDURES:  1. Cystoscopy.  2. Bilateral retrograde pyelography with interpretation.  3. Right ureteroscopy.  4. Right ureteral stent placement.   SURGEON:  Raynelle Bring, M.D.   ANESTHESIA:  General.   COMPLICATIONS:  None.   INDICATIONS:  Mr. Bonebrake is a 77 year old gentleman who presented to the  hospital 2 days ago with diffuse abdominal pain.  He underwent a CT scan  which demonstrated incidentally detected right-sided hydronephrosis  along with significant retroperitoneal and perinephric stranding.  He  also was noted to have significant microscopic hematuria on urinalysis.  In addition, he was noted to have an elevated creatinine of 1.7.  After  discussion regarding these findings with the patient, it was decided to  proceed with the above procedures.  Potential risks, complications, and  alternative options were discussed in detail, and informed consent was  obtained.   DESCRIPTION OF PROCEDURE:  The patient was taken to the operating room,  and a general anesthetic was administered.  At the time of anesthesia  induction, the patient was noted to be tachycardic, with a heart rate in  the 140s.  His heart rate was irregular and consistent with probable  atrial fibrillation.  This was managed medically, and he was  administered preoperative antibiotics, he was placed in the dorsal  lithotomy position, and prepped and draped in the usual sterile fashion.  A preoperative time-out was  performed.  Next, cystourethroscopy was  performed which demonstrated a normal anterior and posterior urethra.  The bladder was entered and examined systematically.  There was noted to  be some flecks of stone material within the bladder.  The ureteral  orifices were in their normal anatomic position.  The remainder of the  bladder was free of any bladder tumors, stones, or other mucosal  pathology.  Attention was then turned to the left ureteral orifice which  was intubated with a 6-French ureteral catheter.  Contrast was injected,  and there was no evidence of any ureteral or renal pelvic filling  defects.  No dilation or other abnormalities were noted.  Attention was  then turned to the right ureteral orifice which was intubated with a 6-  [place] ureteral catheter, and contrast was again injected.  The ureter  appeared of normal caliber.  There was some mild dilation of the  proximal ureter and renal pelvis.  No filling defects or extravasation  was noted.  A 0.038 sensor guidewire was then advanced into the renal  pelvis under fluoroscopic guidance.  A second 0.038 Glidewire was then  inserted up into the renal  pelvis as well.  The Flex-X Storz  ureteroscope was advanced over the Glidewire into the renal pelvis  without difficulty, and ureterorenoscopy was performed.  No  abnormalities were noted within the renal pelvis, and no abnormalities  were noted within the entire length of the ureter.  The ureteroscope was  then removed, and the remaining working sensor guidewire was backloaded  over the cystoscope.  A 6 x 24 double-J ureteral stent was advanced over  the wire and up into the renal pelvis under fluoroscopic guidance.  The  wire was removed, with a good curl noted in the renal pelvis as well as  in the bladder.  The patient's bladder was then emptied, and the  procedure was ended.  The patient remained hemodynamically stable  throughout the procedure, although did remain  tachycardic, which was  managed medically.  The patient was subsequently transferred to the  recovery unit in stable condition.  He was placed on a Cardizem drip,  and a cardiology consultation was obtained.  In addition, Dr. Lysle Rubens was  notified of the patient's status, and it was decided to have the patient  admitted to the Step-Down Intensive Care Unit at [facility].      Raynelle Bring, MD  Electronically Signed     LB/MEDQ  D:  03/04/2008  T:  03/04/2008  Job:  [id number]

## 2011-03-01 NOTE — Discharge Summary (Signed)
NAMEIZAYA, NETHERTON NO.:  [id number]   MEDICAL RECORD NO.:  [id number]          PATIENT TYPE:  INP   LOCATION:  3846                         FACILITY:  [facility]   PHYSICIAN:  Deboraha Sprang, MD, FACCDATE OF BIRTH:  04-Apr-1933   DATE OF ADMISSION:  06/26/2008  DATE OF DISCHARGE:  06/27/2008                               DISCHARGE SUMMARY   ALLERGIES:  PENICILLIN and SULFA.   TIME OF THIS DICTATION:  Greater than 35 minutes.   FINAL DIAGNOSES:  1. Atrial fibrillation/flutter with flutter predominance.      a.     Symptoms, dyspnea.  2. Discharged in day 1 status post electrophysiology study with      creation of bidirectional cavo-tricuspid isthmus block.   SECONDARY DIAGNOSES:  1. History of small-bowel obstruction/colostomy.  2. Diabetes.  3. Question of obstructive sleep apnea.   PROCEDURE:  June 26, 2008 electrophysiology study, radiofrequency  catheter ablation of counterclockwise cavo-tricuspid isthmus dependent  atrial flutter, Dr. Thompson Grayer.   BRIEF HISTORY:  Mr. Crevier is a 77 year old male referred by Dr. [name] for atrial flutter.  He had a history of both atrial fib and  flutter.  This occurred postoperatively after a small bowel obstruction.  It is thought that if the atrial flutter is ablated, atrial fibrillation  would be recessive.   HOSPITAL COURSE:  The patient presents electively, June 26, 2008.  He underwent electrophysiology study with radiofrequency catheter  ablation of a cavo-tricuspid isthmus atrial flutter with creation of  bidirectional block, Dr. Thompson Grayer.  The patient had no  postprocedural complications.  Discharged on postprocedure day #1.   Medications at discharge have been adjusted:  1. Fenex  150 mg twice daily.  2. Metoprolol 50 mg 1-1-1/2 tablet twice daily.  3. Lisinopril 5 mg daily.  4. Potassium citrate 5 mEq daily.  5. Glyburide/metformin 5/500 two tablets twice daily.  6. Enteric-coated  aspirin 81 mg daily.  7. Simvastatin 20 mg daily at bedtime.  8. Actos 45 mg daily.   The patient was asked to stop Coumadin and to stop diltiazem.  He  follows up with [facility], [address], Thursday,  August 07, 2008 at 1:45 p.m.   Laboratory studies this admission were drawn on June 18, 2008.  White cells 8.2, hemoglobin 11.8, hematocrit 34.3, and platelets of 339.  The protime is 22.4, INR is 2.  Sodium was 138, potassium 4.7, chloride  110, carbonate 26, glucose 78, BUN is 31, creatinine 1.4.      Sueanne Margarita, [place]      Deboraha Sprang, MD, [facility]  Electronically Signed    GM/MEDQ  D:  06/27/2008  T:  06/28/2008  Job:  [id number]   cc:   Deboraha Sprang, MD, [facility]  [name], M.D.

## 2011-03-01 NOTE — Consult Note (Signed)
NAMEDAVY, WESTMORELAND NO.:  [id number]   MEDICAL RECORD NO.:  [id number]          PATIENT TYPE:  INP   LOCATION:  2010                         FACILITY:  [place]   PHYSICIAN:  Champ Mungo. Lovena Le, MD    DATE OF BIRTH:  1933/03/15   DATE OF CONSULTATION:  05/23/2008  DATE OF DISCHARGE:                                 CONSULTATION   The consultation is requested by Dr. [name].   INDICATION FOR CONSULTATION:  Evaluation of atrial flutter.   HISTORY OF PRESENT ILLNESS:  The patient is a 77 year old man with a  history of atrial flutter who was admitted to the hospital with  shortness of breath and a rapid ventricular response.  He does not feel  palpitations.  His history of atrial arrhythmias actually began several  months ago when he was undergoing a urologic procedure and developed  what is described as atrial fibrillation, though I do not have strips on  this.  This apparently converted on its own and the patient has been  stable until representation 2 days ago with shortness of breath.  He did  not feel palpitations.  He denied chest pain.  On EKG, he was found to  be in a narrow QRS tachycardia at 154 beats per minute.  Adenosine was  given and demonstrated clear-cut atrial flutter.  The patient was  started on IV Cardizem.  He spontaneously returned to sinus rhythm.  He  is referred now for additional evaluation.  He has never had frank  syncope.   MEDICATIONS:  Include:  1. Aspirin 81 daily.  2. Metoprolol 50 mg twice daily.  3. Insulin as directed.  4. Warfarin as directed.  5. Furosemide 40 mg daily.  6. Actos 45 a day.  7. Simvastatin 20 a day.  8. Metformin 1 g daily.  9. Lisinopril 5 mg daily.  10.Diltiazem 60 mg every 6 hours.  11.He is also on IV heparin.  12.He was started on Coumadin.   FAMILY HISTORY:  Notable for mother died of colon cancer and father of  complications of the abdominal surgery and spleen rupture.   ADDITIONAL PAST  MEDICAL HISTORY:  Notable for ulcerative colitis dating  back to the 1980s with colectomy in 1985 with revision 2 years following  this.  The patient has diabetes dating back 20 years.  It has been  relatively well controlled despite his obesity.   ALLERGIES:  He has a history of allergy to sulfa and penicillin.   SOCIAL HISTORY:  The patient is married.  He gives primary care for his  wife who has lateral sclerosis.  He has a history of tobacco abuse, but  denies smoking cigarettes now and denies drinking alcohol.  He drives a  dump truck.   REVIEW OF SYSTEMS:  Negative for any obvious bright red blood in his  stools or black tarry stools in his colostomy bag.  He denies cough or  upper respiratory symptoms.  His weight has not changed particularly.   OBJECTIVE:  Rest of his review of systems was negative.   PHYSICAL  EXAMINATION:  GENERAL:  He is a pleasant, obese, and middle-  aged man in no acute distress.  VITAL SIGNS:  Blood pressure is 95/65, the pulse is 60 and regular,  respirations 20, and the temperature is 98.  HEENT:  Normocephalic and atraumatic.  Pupils are equal and round.  Oropharynx is moist.  Sclerae anicteric.  NECK:  Revealed no jugular distention.  There are no thyromegaly.  Trachea is midline.  The carotids are 2+ and symmetric.  LUNGS:  Clear bilaterally to auscultation.  No wheezes, rales, or  rhonchi are present.  There is no increased work of breathing.  CARDIOVASCULAR:  Exam is distant with a regular rate and rhythm with  normal S1-S2.  I do not appreciate any murmurs.  ABDOMEN:  Exam is obese, nontender, and nondistended.  There is no  organomegaly.  Bowel sounds are present.  There is no rebound or  guarding.  EXTREMITIES:  Demonstrated no cyanosis, clubbing, or edema.  The pulses  are 2+ and symmetric.  NEUROLOGIC:  Alert and oriented x2.  His cranial nerves are intact.   IMAGING:  His EKG demonstrates probable typical atrial flutter with a  rapid  ventricular response.  There are no obvious atrial fibrillation on  the present set of EKGs.   IMPRESSION:  1. Symptomatic atrial flutter.  2. History of atrial fibrillation.  3. Ulcerative colitis.  4. Anemia with heme-positive stools   DISCUSSION:  The patient has a clear indication for long-term Coumadin  therapy, although if he is actively GI bleeding, this might be called  into question.  The patient has both atrial flutter and fibrillation  period for now because he has reverted back to sinus rhythm  spontaneously.  I would recommend calcium channel blockers and beta-  blockers in an attempt to control his atrial arrhythmias with  medications.  If he had recurrence of atrial flutter however, catheter  ablation would be recommended, though his indication for Coumadin (I am  not sure he is a Coumadin candidate) would still be present long-  standing.  I would like to see the patient back in the office in several  weeks for additional followup.        Champ Mungo. Lovena Le, MD  Electronically Signed     GWT/MEDQ  D:  05/23/2008  T:  05/24/2008  Job:  [id number]   cc:   [name], MD

## 2011-03-01 NOTE — Op Note (Signed)
NAME:  [name] [name], [name] [name] NO.:  [id number]   MEDICAL RECORD NO.:  [id number]          PATIENT TYPE:  OIB   LOCATION:  3742                         FACILITY:  [place]   PHYSICIAN:  Thompson Grayer, MD       DATE OF BIRTH:  27-Jun-1933   DATE OF PROCEDURE:  DATE OF DISCHARGE:                               OPERATIVE REPORT   EP STUDY NOTE   PRIMARY SURGEON:  Deboraha Sprang, MD, [facility]   FIRST ASSISTANT:  Thompson Grayer, MD   PREPROCEDURE DIAGNOSIS:  Isthmus-dependent right atrial flutter.   POSTPROCEDURE DIAGNOSIS:  Isthmus-dependent right atrial flutter.   PROCEDURES:  1. Diagnostic electrophysiology study.  2. Coronary sinus pacing and recording.  3. Radiofrequency ablation of supraventricular tachycardia.   DESCRIPTION OF THE PROCEDURE:  Informed written consent was obtained and  the patient was brought to the electrophysiology lab in a fasting state.  He was adequately sedated with intravenous midazolam and fentanyl as  outlined in the nursing report.  The patient's right and left groins  were prepped and draped in the usual sterile fashion by the EP lab  staff.  Using a percutaneous Seldinger technique one 7-French and two 8-  [place] hemostasis sheaths were placed in the right common femoral vein.  A 6-French hemostasis sheath was placed in the left common femoral vein.  A [employer]. [employer] medical 5-French CRD catheter was introduced through the left  common femoral vein and advanced into the His bundle location.  A 6-  French decapolar Pleurx catheter was introduced in the right common  femoral vein and advanced into the coronary sinus for pacing and  recording from this location.  A 7-French [employer] duodecapolar  Halo catheter was introduced in the right common femoral vein and was  positioned around the tricuspid valve annulus.  A 7-French [employer] II XP 8 mm ablation catheter was introduced into the  right common femoral vein and advanced into  the right atrium.  The  patient presented to the electrophysiology lab in normal sinus rhythm.  The RR interval measured 1033 msec with an PR interval of 178 msec, a  QRS with 119 msec, and QT interval of 443 msec.  The AH interval  measured 82 msec with an HV interval of 41 msec.  Pacing was performed  from the distal coronary sinus and the activation pattern recorded along  the tricuspid valve annulus suggested conduction through the  cavotricuspid isthmus at baseline.  The ablation catheter therefore  positioned along the usual cavotricuspid isthmus and a series of  radiofrequency applications were delivered between the tricuspid valve  annulus and the inferior vena cava along the usual flutter isthmus.  The  total RF time was 5 minutes 19 seconds with a target temperature of 60  degrees at 60 volts power.  During ablation, the activation sequence  along the tricuspid valve annulus converted suggesting bidirectional  isthmus block.  When pacing from the distal coronary sinus the earliest  atrial electrogram recorded across the cavotricuspid isthmus measured  183 msec.  Differential atrial pacing was then  performed from the low  lateral right atrium, which confirmed complete bidirectional isthmus  block.  The patient was observed for 15 minutes without return of  conduction through the usual flutter isthmus.  Rapid atrial pacing was  then performed, which revealed an AV Wenckebach cycle length 350 msec  with no inducible SVT or evidence of PR great than RR.  Atrial  extrastimulus testing was performed which revealed an AV nodal ERP of  600/260 msec with no AH jumps, echo beats, or tachycardiac induced.  Rapid ventricular pacing was performed which revealed VA dissociation  when pacing at 600 msec from the right ventricular apex.  The procedure  was therefore considered completed.  All catheter were removed and the  sheaths were aspirated and flushed.  The sheaths were removed and   hemostasis was assured.  There were no early apparent complications.   CONCLUSIONS:  1. Sinus rhythm upon presentation.  2. History of isthmus-dependent right atrial flutter.  3. Successful radiofrequency ablation of atrial flutter along the      usual cavotricuspid isthmus with complete bidirectional isthmus      block achieved.  4. No early apparent complications.  5. No evidence of dual AV nodal physiology or accessory pathways.      Thompson Grayer, MD  Electronically Signed     JA/MEDQ  D:  06/26/2008  T:  06/27/2008  Job:  [id number]   cc:   [name], M.D.  [name], MD

## 2011-03-01 NOTE — Discharge Summary (Signed)
NAMEROWAN, [name] NO.:  [id number]   MEDICAL RECORD NO.:  [id number]          PATIENT TYPE:  INP   LOCATION:  1237                         FACILITY:  [facility]   PHYSICIAN:  [name], MD      DATE OF BIRTH:  26-Jan-1933   DATE OF ADMISSION:  03/04/2008  DATE OF DISCHARGE:  03/05/2008                               DISCHARGE SUMMARY   DISCHARGE DIAGNOSIS:  1. Partial small-bowel obstruction, resolved, with conservative      management.  2. History of ostomy with total colectomy secondary to ulcerative      colitis.  3. Small peristomal hernia.  4. Right hydronephrosis and hydroureter secondary to multiple kidney      stones.  5. Status post stent placement.  6. Acute renal insufficiency resolved.  7. Atrial fibrillation with rapid ventricular response postop,      resolved.  8. Type 2 diabetes.  9. Hypertension.   MEDICATION ON DISCHARGE:  1. Actos 45 mg daily.  2. Glucovance 5/500 two tablets twice a day.  3. Lisinopril 5 mg daily.  4. Simvastatin 20 mg daily.  5. Lopressor 25 mg b.i.d.  6. Cipro 500 b.i.d. for 10 days.   FOLLOWUP:  With the urology, Dr. Alinda Money.  Follow with [facility].  The patient will see me in 1 week at the office.   CONSULTATIONS:  General surgery, urology, cardiology, and radiology.   CT SCAN:  Of the abdomen and pelvis showed right hydronephrosis and  right hydroureter with multiple kidney stones, bilateral nonobstructing  kidney stones.  No evidence of any mechanical obstruction.   CHEST X-RAY:  Mild cardiomegaly.   LAB DATA:  Initial creatinine 1.66, creatinine at discharge 1.09,  potassium 3.8, BUN of 9 with sodium 136.  Cardiac markers x3 negative.  White count 10.1, hemoglobin 10.7.  LFTs were normal.  TSH 1.2.   PROCEDURES:  Status post right stent placement with cystoscopy.   HOSPITAL COURSE:  2. A 77 year old male with a history of ulcerative colitis with total      colectomy, many years ago, had ostomy.   Admitted with abdominal      pain and found to have, on the x-rays, early partial small-bowel      obstruction symptoms.  The patient was admitted to [facility], initially, n.p.o. and IV fluids and pain medications.      Surgery was consulted.  Continued to have conservative management.      He started having improvement, but abdominal pain continued.  The      eventually had a CT scan of the abdomen and pelvis done which      showed right hydronephrosis and hydroureter with multiple stones      and urology was consulted.  The patient underwent a stent      placement, under cystoscopy, at [facility].  His pain      improved significantly.  He continues to have good ostomy output.      Postop after stent placement, the patient went into afebrile with  rapid ventricular response, hemodynamically stable.  Ruled out for      an MI.  Cardiology was consulted.  He converted back to normal      sinus rhythm within a few hours, and remained in normal sinus      rhythm.  He had no underlying cardiac disease.  The patient will      have an outpatient follow with cardiology.   1. History of afebrile __________ response.  Postop resolved      spontaneously converted to normal sinus rhythm.  Echocardiogram was      performed.  His lab work including cardiac markers was negative.      TSH was normal.  Chest x-ray mild cardiomegaly.  No evidence of      CHF.  The patient will have cardiac monitor as an outpatient to      evaluate for any recurrent arrhythmias.  He does not need long-term      Coumadin at this point, and follow up on the echo as an outpatient      with [facility].   1. As far as his right obstructive uropathy, his renal functions      improved after stent placement on the right ureter.  He will follow      up with urologist, Dr. Alinda Money, to have that removed.  I will put      him on Cipro to prevent infection for 10 days.  Continue IV fluids.       Continue p.o. hydration and encourage fluids.   1. As far as diabetes, his metformin and Glucotrol was held initially.      We will restart that back.  His blood sugar has been stable.   1. Hypertension.  Continue on lisinopril, Lopressor was added 25 mg      b.i.d. for his rate control.  Follow up as an outpatient and see      whether he will need that after he has the monitor placement and      cardiology follow up.  I will see him back in 1 week.      [name], MD  Electronically Signed     KH/MEDQ  D:  03/05/2008  T:  03/05/2008  Job:  [id number]

## 2011-03-01 NOTE — Consult Note (Signed)
NAMEBRITON, SELLMAN NO.:  [id number]   MEDICAL RECORD NO.:  [id number]          PATIENT TYPE:  INP   LOCATION:  1237                         FACILITY:  [facility]   PHYSICIAN:  [name], M.D.     DATE OF BIRTH:  Oct 18, 1932   DATE OF CONSULTATION:  03/04/2008  DATE OF DISCHARGE:                                 CONSULTATION   CHIEF COMPLAINT:  New onset rapid atrial fibrillation.   HISTORY OF PRESENT ILLNESS:  This is a 77 year old white male with no  known history of coronary disease, who is just postop from genitourinary  surgery and was found be in rapid AFib.  Preop EKG showed sinus rhythm  at 85 with an interventricular conduction delay.  Apparently it was  stated that the patient was in normal sinus rhythm when he came in and  then converted to atrial fibrillation, but was taken to the OR.  The  surgery went well.  The patient was completely asymptomatic with  systolic blood pressures in the 130s.  Postop, he is still in AFib with  RVR and Cardizem 20 mg per hour was started.  Initially the patient was  admitted to [facility] with a partial small-bowel obstruction with  renal insufficiency.  The CT showed a right-sided obstructive uropathy  with hydronephrosis, perinephric and  periureteral fluid stranding, thus prompting the transfer to [facility].  The patient states he had a stress test years ago.  It was  reported to him to be negative in 2004.  He states that he has had an  irregular heartbeat his whole life, but knows nothing about ever having  atrial fibrillation and has never been on Coumadin.  He denies any chest  pain, shortness of breath, PND, orthopnea, lower extremity edema.  No  dizziness, presyncope or syncope.  He is currently asymptomatic and  cannot tell he is in AFib.   ALLERGIES:  1. PENICILLIN.  2. SULFA.   MEDICATIONS:  1. Cipro IV 400 mg a day.  2. Indigo carmine.  3. Zofran.  4. Cardizem IV at 20 mg per hour.   SOCIAL  HISTORY:  He denies any tobacco or alcohol use.  No IV drugs.  He  is married.   FAMILY HISTORY:  Significant for coronary disease and CHF with his  father.   PAST MEDICAL HISTORY:  1. Hypertension.  2. Diabetes.  3. Gastroesophageal reflux.  4. Gastritis.  5. Obesity.   PAST SURGICAL HISTORY:  1. History of colectomy with colostomy.  2. Back surgery.   REVIEW OF SYSTEMS:  Other than what is stated in the HPI.  He denies any  nausea, vomiting, cough, wheezing, depression, headaches, chest pain,  shortness of breath or lower extremity edema.   PHYSICAL EXAMINATION:  VITAL SIGNS:  Stable.  HEENT:  Benign.  NECK:  Supple without lymphadenopathy.  Carotid upstrokes are +2  bilaterally, no bruits.  LUNGS:  Clear to auscultation throughout anteriorly.  HEART:  Tachycardic and irregular.  No murmurs, rubs or gallops.  ABDOMEN:  Benign.  EXTREMITIES:  No edema.  NEUROLOGIC:  He is alert and oriented x3.   LABORATORY DATA:  Sodium 137, potassium 3.9, chloride 109, bicarb 22,  BUN 10, creatinine 1.1, glucose 138.  LFTs are normal.  Phosphorus 4.6,  magnesium 1.6, white cell count 11.4, hemoglobin 10.3, hematocrit 30.3,  platelet count 239.   DIAGNOSTICS:  1. Chest x-ray shows stable cardiomegaly, mild bibasilar atelectasis      and mild stable bibasilar interstitial accentuation.  2. EKG this morning before surgery showed sinus rhythm with      interventricular conduction delay 85.  This evening during consult,      atrial fibrillation with rapid ventricular response at 153 beats      per minute.   ASSESSMENT:  1. Atrial fibrillation with rapid ventricular response.  2. Hypertension.  3. Diabetes mellitus.  4. Right hydronephrosis with hematuria, status post stenting.   PLAN:  We will check cardiac enzymes x3.  Check a TSH, magnesium,  electrolytes including LFTs.  We will check a 2-D echocardiogram once  rate controlled and also get an EKG if the patient converts back to   sinus rhythm.  We will continue IV Cardizem at 20 mg per hour until rate  controlled.  We will also give 5 mg IV Lopressor now and start on 25 mg  of Lopressor b.i.d.  Given that the patient has never had any known AFib  prior and was in sinus rhythm when he came in, if patient converts while  in the hospital, we will discharge home not on any anticoagulation, but  will get an outpatient CardioNet monitor to evaluate for sign of  arrhythmias over the next 30 days.  If he has no other evidence of AFib,  we will not anticoagulate long-term.   FOLLOW UP:  He will follow up with me in the office after discharge.      [name], M.D.  Electronically Signed     TT/MEDQ  D:  03/05/2008  T:  03/05/2008  Job:  [id number]   cc:   [name], MD  Fax: [phone]

## 2011-03-01 NOTE — Letter (Signed)
June 18, 2008    [name], M.D.  [address] [place]  [place], [place] [address]   RE:  [name], [name]  MRN:  [id number]  /  DOB:  1933/02/11   Dear Tressia Miners,   [name] [name] comes in, having been seen in the hospital for his atrial  flutter.  As you recall, the deteriorated atrial fibrillation and his  presentation was with dyspnea.   His only other arrhythmia occurred when you saw him in May  postoperatively for his small bowel obstruction.   His thromboembolic risk factors are notable for diabetes, given the  chest score of one and he is on Coumadin.   His other medications include metoprolol 50 b.i.d. and diltiazem 240 for  rate control, lisinopril 5 for renal protection, warfarin, Actos, fish  oil, and iron supplementation.   On examination, his blood pressure was 130/62, his pulse was 64.  His  neck veins were flat.  His lungs were clear.  His heart sounds were  regular with an S4.  The abdomen was soft with colostomy bag.  Extremities had no edema.  His weight was 245 pounds.   Electrocardiogram today demonstrated sinus rhythm at 66 with intervals  of 0.17/0.10/0.42 with an axis of zero.  There was an occasional PVC.   IMPRESSION:  1. Atrial flutter with subsequent atrial fibrillation, as well as an      episode of postoperative atrial fibrillation.  2. Dyspnea associated with atrial flutter and rapid ventricular      response.  3. Thromboembolic risk factors notable for diabetes with the chest      score of 1.  4. Obesity, question sleep apnea.   [name] [name], [name] [name], has atrial flutter with postoperative atrial  fibrillation which may have been related simply to that and atrial  fibrillation deteriorating from his atrial flutter.  I think it is a  reasonable undertaking to consider ablation of his atrial flutter with  the hopes that atrial fibrillation does not recur for some number of  years and hence his anticoagulation will be recently discontinued.   We  would also ask to discontinue his rate controlling medications.   I have discussed with him the potential benefits, as well as the  potential risks including, but not limited to death, perforation, heart  block requiring pacemaker implantation, as well as possibility of atrial  flutter recurring.  He understands these risks and would like to  proceed.    Sincerely,      Deboraha Sprang, MD, [facility]  Electronically Signed    SCK/MedQ  DD: 06/18/2008  DT: 06/19/2008  Job #: [id number]   CC:    [name], MD

## 2011-03-01 NOTE — H&P (Signed)
[name] [name], [name] [name] NO.:  [id number]   MEDICAL RECORD NO.:  [id number]          PATIENT TYPE:  INP   LOCATION:  2010                         FACILITY:  [facility]   PHYSICIAN:  Darlin Coco, M.D. DATE OF BIRTH:  02/21/1933   DATE OF ADMISSION:  05/22/2008  DATE OF DISCHARGE:                              HISTORY & PHYSICAL   CHIEF COMPLAINT:  Tachycardia and dyspnea.   HISTORY:  This is a 77 year old Caucasian male admitted to the emergency  room with atrial flutter and rapid ventricular response.  The patient  has had recent increased dyspnea worse last night and came to the  emergency room.  He has been experiencing orthopnea.  He denies any  chest pain.  On arrival in the emergency room, he was found to be in  tachycardia with ventricular response of 154 and narrow QRS.  Trial of  Benicar resulted in visualization of atrial flutter waves, but he did  not convert.  The patient states that in April 2009, he was hospitalized  for kidney stones by Dr. Alinda Money and underwent urologic surgery.  Apparently, intraoperatively or postoperatively he developed tachycardia  and Dr. [name] saw him.  He had a subsequent adenosine Cardiolite  at Dr. Theodosia Blender office and was told that they found no blockage but that  his heart muscle was weak.   HOME MEDICATIONS:  1. Actos 45 mg daily.  2. Lisinopril 5 mg daily.  3. Simvastatin 20 mg daily.  4. Metformin 500 mg two in the morning and one at night.  5. Metoprolol 50 mg b.i.d.  6. Aspirin 81 mg daily.  7. Zantac 75 mg b.i.d.   He has had a recent trial of Ambien, but it caused him to be confused  and is presently on hold.   PAST MEDICAL HISTORY:  He has had a history of ulcerative colitis since  the 33s and underwent a colectomy in 1985.  He has a history of a  ureteral stone in April 2009 requiring stenting by Dr. Alinda Money.  He has  been diabetic for about 20 years.  He reports that his A1cs have been  reasonably  good.   FAMILY HISTORY:  Mother died of colon cancer.  Father died  postoperatively of complications following abdominal surgery and  inadvertent rupture of the spleen with hemorrhage.   SOCIAL HISTORY:  The patient is married.  His wife is an involute and  has primary lateral sclerosis and the patient is a caregiver for his  wife at night and they have a hired caregiver during the day from 9-5.  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  The patient does not smoke cigarettes and does not drink alcohol.   ALLERGIES:  He is allergic to SULFA and PENICILLIN.   REVIEW OF SYSTEMS:  History of remote ulcers which were not bleeding in  1962.  He denies any dysuria.  He denies any cough or sputum production.  Remainder of review of systems negative in detail.   PHYSICAL EXAMINATION:  VITAL SIGNS:  His blood pressure is  108/76, pulse  is 156, and rhythm is atrial flutter with 2:1 block.  HEAD AND NECK:  Pupils equal and reactive.  Color is sallow.  The  sclerae are nonicteric.  The mouth and pharynx normal.  NECK:  Carotids normal.  Jugular venous pressure normal.  Thyroid  normal.  CHEST:  Clear.  HEART:  No murmur, gallop, rub or click.  ABDOMEN:  Obese.  There is a stoma from his colostomy or ileostomy in  the right lower quadrant.  The abdomen is nontender.  EXTREMITIES:  No phlebitis and trace edema.  NEUROLOGIC:  Physiologic.  The patient is alert and is in no acute  distress.   PERTINENT ADMISSION LABORATORY DATA:  Sodium 135, potassium 4.5, BUN 38,  and creatinine 1.42.  B-natriuretic peptide 392, CK-MB 1.3, troponin  0.05.  Hemoglobin 9.0 and white count normal.  Chest x-ray shows  cardiomegaly and CHF.  EKG shows atrial flutter with variable block and  incomplete right bundle branch block with left anterior hemiblock.   IMPRESSION:  1. New onset of atrial flutter with 2:1 block.  2. Congestive heart failure.  3. Diabetes mellitus.  4. Status  post colectomy for ulcerative colitis.  5. Anemia.  6. Renal insufficiency.   DISPOSITION:  We are admitting to Dr. [name] to telemetry.  We  will treat with IV heparin and Coumadin.  We will use IV Cardizem and  beta blockers for rate control.  We will monitor his diabetes and use  sliding scale.  We will evaluate his anemia.  We will treat heart  failure with oxygen and Lasix.  Further measures as per Dr. [name].           ______________________________  Darlin Coco, M.D.     TB/MEDQ  D:  05/22/2008  T:  05/22/2008  Job:  [id number]   cc:   [name], MD

## 2011-03-01 NOTE — Consult Note (Signed)
NAMEMarland Kitchen  [name], [name] NO.:  [id number]   MEDICAL RECORD NO.:  [id number]          PATIENT TYPE:  INP   LOCATION:  1237                         FACILITY:  [facility]   PHYSICIAN:  Raynelle Bring, MD      DATE OF BIRTH:  29-Nov-1932   DATE OF CONSULTATION:  03/04/2008  DATE OF DISCHARGE:                                 CONSULTATION   REASON FOR CONSULTATION:  Right hydronephrosis.   HISTORY:  [name] [name] is a 77 year old gentleman with a history of  ulcerative colitis status post a colectomy and creation of a right  abdominal colostomy approximately 25 years ago.  He presented about 2  days ago with diffuse abdominal pain.  He denies significant nausea or  vomiting.  He has had decreased output from his ostomy.  He no longer  has a rectum.  The patient was admitted to the hospital and was being  managed conservatively for a partial small-bowel obstruction.  He  underwent a CT scan yesterday, which demonstrated right-sided  hydronephrosis with significant perinephric and periureteral stranding  in the retroperitoneum.  He was noted to have an 8-mm nonobstructing  left renal calculus and a small nonobstructing lower pole right renal  calculus, but no ureteral calculi that would be causing obstruction.  He  states that he has been voiding well and denies any significant voiding  or storage urinary symptoms except for occasional nocturia.  He denies a  history of gross hematuria, history of urinary tract infections,  urolithiasis, GU malignancy, trauma, or surgery.  He has also been found  to have an elevated serum creatinine.  It was 1.66 on admission and is  currently 1.7, indicating that it is not necessarily rising.  It is  unsure what his baseline creatinine is.   PAST MEDICAL HISTORY:  1. Hypertension.  2. Diabetes.  3. Gastroesophageal reflux disease.  4. Coronary artery disease.  5. Ulcerative colitis   PAST SURGICAL HISTORY:  1. Total colectomy.  2. Creation of  colostomy.  3. Hernia repair x3.  4. Back surgery.  5. Tonsillectomy.   HOME MEDICATIONS:  1. Glyburide.  2. Metformin.  3. Simvastatin.  4. Lisinopril.  5. Actos.   ALLERGIES:  The patient reportedly has an allergy to PENICILLIN and  SULFA.   FAMILY HISTORY:  Positive for coronary artery disease and diabetes.  There is no history of GU malignancy.   SOCIAL HISTORY:  The patient currently lives in [place] with his  wife.  There is no history of alcohol or tobacco use.   REVIEW OF SYSTEMS:  A complete review of systems was performed.  Pertinent positives include abdominal pain and decreased output from his  colostomy.  Pertinent negatives include no fever, nausea or vomiting, or  flank pain.   PHYSICAL EXAMINATION:  VITALS:  Temperature 97.9, heart rate 88,  respirations 18, blood pressure 118/58, blood glucose 168.  CONSTITUTIONAL:  Alert and oriented in no acute distress.  HEENT:  Normocephalic, atraumatic, oropharynx is clear.  NECK:  No JVD, supple, no lymphadenopathy.  CARDIOVASCULAR:  Pulse is regular.  RESPIRATORY:  Normal respiratory effort.  ABDOMEN:  Obese with a right-sided abdominal colostomy with no output  within the bag.  His ostomy appears pink and patent.  His abdominal exam  is soft and nondistended.  He is not particularly tender and certainly  has no peritoneal signs including no rebound or guarding on exam this  morning.  He does have mild tenderness in the left upper quadrant.  BACK:  No left CVA tenderness.  He does have mild right CVA tenderness  on palpation.  GU:  Normal male phallus with a normal urethral meatus and normal penile  shaft.  He has a solitary right testis.  DRE unable to be performed as the patient does not have a rectum.  EXTREMITIES:  No edema.  NEUROLOGIC:  Grossly intact.  PSYCHIATRIC:  Normal mood and affect   LABORATORY DATA:  Serum creatinine 1.74.  Serum electrolytes are for the  most part within normal limits.   Urinalysis demonstrates 0-2 white blood  cells and too numerous to count red blood cells.   RADIOLOGIC IMAGING:  The patient's abdominal and pelvic CT scan was  independently reviewed.  This demonstrates findings as dictated above.   IMPRESSION:  1. Right hydronephrosis.  2. Microscopic hematuria.  3. Renal insufficiency.   RECOMMENDATION:  The patient does have right-sided hydronephrosis along  with a mildly elevated creatinine.  It is unsure whether his renal  insufficiency is chronic and it certainly does not appear to be  increasing over the last few days.  I am unsure how much his ureteral  obstruction may be contributing to either his abdominal pain or his  serum creatinine, but I have informed the patient that I think the most  prudent approach would be to consider cystoscopy with retrograde  pyelography, possible ureteroscopy, and right ureteral stent placement.  This can be performed on a non-urgent basis based on the patient's  current clinical status.  We have discussed the risks, potential  benefits, and alternative options.  I have also recommended that he  undergo bilateral retrograde pyelography  considering his history of microscopic hematuria for further evaluation,  although this may be explained by his nonobstructing nephrolithiasis.   Thank you very much for this consultation.      Raynelle Bring, MD  Electronically Signed     LB/MEDQ  D:  03/04/2008  T:  03/04/2008  Job:  [id number]   cc:   [name], MD

## 2011-03-04 NOTE — Discharge Summary (Signed)
NAMEALIKA, SALADIN NO.:  [id number]   MEDICAL RECORD NO.:  [id number]          PATIENT TYPE:  INP   LOCATION:  2010                         FACILITY:  [facility]   PHYSICIAN:  [name], M.D.     DATE OF BIRTH:  1933-02-04   DATE OF ADMISSION:  05/22/2008  DATE OF DISCHARGE:  05/28/2008                               DISCHARGE SUMMARY   DISCHARGE DIAGNOSES:  1. Paroxysmal atrial flutter in sinus rhythm on discharge while on      calcium channel blocker and beta-blocker.  2. Diabetes mellitus.  3. Chronic renal insufficiency.  4. Acute diastolic congestive heart failure secondary to rapid atrial      flutter, resolved.  5. Chronic anemia with no obvious gastrointestinal sores.  6. Long-term medication use.   HOSPITAL COURSE:  Mr. Mazur is a 77 year old male patient who was  admitted to [facility] on May 22, 2008, with atrial flutter.  Prior  to that time, he noticed dyspnea that had worsened.  Recent orthopnea.  He decided to come to the emergency room where he was found to be in  atrial flutter with ventricular response at 154 beats per minute.   Of note, he was hospitalized in April 2009 and had tachycardia during  urologic surgery.  He apparently saw Dr. Radford Pax postoperatively and had  an adenosine Cardiolite at her office and the patient tells [place] that  there was no blockage.  He also said that he was told his heart muscle  was weak.  Please note that the patient had a 2D echo in May 2009 at  [facility].  This showed his EF was 55%.   The patient was admitted to [facility] and was started on IV heparin,  Coumadin, and IV Cardizem.  Ultimately, an EP consult was obtained.  Dr.  Cristopher Peru saw the patient and felt that he would try calcium channel  blockers and Coumadin for now.  He had recurrent atrial flutter.  He  felt that flutter ablation would be warranted.  Ultimately, the patient  converted back to normal sinus rhythm on calcium channel  blockers and  beta blockers and was discharged to home.   The patient was anemic and ultimately during this hospitalization  required an EGD that was normal.  There was no source of anemia noted.  No obvious source.  Apparently, during the patient's hospitalization, he  had hit his head secondary to what seems to be a fall from what I can  tell.  A CT of the head was performed that showed no acute intracranial  findings and no skull fracture.   Ultimately, the patient was discharged to home in stable condition.   Lab work is as follows:  Sodium 138, potassium 4.7, BUN 31, creatinine  1.4, hemoglobin 11.8, hematocrit 34.3, white count 8.2, platelets 339.  PT 22.4, INR 2.0.   DISCHARGE MEDICATIONS:  1. Actos 45 mg a day.  2. Simvastatin 20 mg a day.  3. Metformin 500 mg 2 tablets in the morning and 1 tablet in the  evening.  4. Baby aspirin daily.  5. Zantac 75 mg twice a day.  6. Metoprolol 50 mg 1-1/2 tablets twice a day.  7. Diltiazem CD 240 mg a day.  8. Niferex 150 mg one p.o. b.i.d.   Stop Ambien.   Increase activity slowly.  Follow up with Dr. Cristopher Peru in 2-3 weeks.  The patient is to call for an appointment.  The patient has some point  with Dr. Radford Pax on May 30, 2008, at 2:30 p.m.      Joesphine Bare, P.A.      [name], M.D.  Electronically Signed    LB/MEDQ  D:  07/12/2008  T:  07/13/2008  Job:  [id number]   cc:   Champ Mungo. Lovena Le, MD

## 2011-03-04 NOTE — H&P (Signed)
[facility]  Patient:    [name] [name], [name] [name]                        MRN: [id number] Adm. Date:  [id number] Attending:  Charisse Klinefelter                         History and Physical  CHIEF COMPLAINT:  Leg pain and swelling and rash.  HISTORY OF PRESENT ILLNESS:  This is a 77 year old diabetic with worsening pain and swelling in left lower extremity.  Patient seen in my office four days prior to this day of admission and he was begun on outpatient therapy including intramuscular ceftriaxone.  Despite this management he worsened and for this he presents to the emergency room this day.  The patient does endorse fever, chills, nausea, vomiting, diarrhea, dysuria, light head, headache. Today patient developed a rash on his left lower extremity.  Patient denies syncope, chest pain, shortness of breath, dysuria, bowel abnormalities.  PAST MEDICAL HISTORY:  Non-insulin dependent diabetes mellitus, GERD, gastritis, CAD.  PAST SURGICAL HISTORY:  Colectomy and colostomy placement, hernia repair, herniorrhaphy x 3, back surgery.  FAMILY HISTORY:  Diabetes mellitus, brother with myocardial infarction.  SOCIAL HISTORY:  No ethanol, no tobacco.  ALLERGIES:  PENICILLIN and SULFA.  MEDICATIONS: 1. _________ 5 plus 500 b.i.d. 2. ECASA 81 mg p.o. q.d. 3. Zantac 75 mg p.o. q.d.  REVIEW OF SYSTEMS:  No blurred vision, no visual loss, no skin breakdown, no history of trauma.  See HPI.  DATA:  CBC, BMET pending.  PHYSICAL EXAMINATION:  GENERAL:  Nontoxic and no acute distress.  HEENT:  Pupils equal, round, and reactive to light.  Extraocular movements intact.  Mucous membranes are moist.  Oropharynx is without lesions.  NECK:  Supple.  Lymph node surveys negative.  HEART:  Regular rate and rhythm with no murmur, rub, or gallop.  LUNGS:  Clear to auscultation, good symmetric air movement.  ABDOMEN:  Soft, no hepatosplenomegaly, no mass, no rebound, no  guarding, normal bowel sounds.  EXTREMITIES:  No clubbing, no cyanosis.  Left lower extremity is with pitting edema to knee.  Popliteal tenderness, no cords or Homans noted.  NEUROLOGIC:  Distal sensation is normal to pinprick and light touch. Vibratory sense is diminished with symmetric ecchymosis and intact to distribution over left lower extremity.  VITAL SIGNS:  Blood pressure 130/70, temperature 98, heart rate 96, respiratory rate 12.  ASSESSMENT/PLAN: 1. Ciprofloxacin 400 IV b.i.d. 2. Diabetes mellitus:  Hold Glucophage, use Amaryl 8 mg q.d. and sliding scale    Regular insulin. 3. Gastroesophageal reflux disease:  Zantac 75 q.d. 4. Coronary artery disease:  Aspirin 81 one p.o. q.d. DD:  05/14/00 TD:  05/15/00 Job: [id number]

## 2011-03-04 NOTE — Discharge Summary (Signed)
[facility]  Patient:    [name] [name]                         MRN: [id number] Adm. Date:  [id number] Disc. Date: [id number] Attending:  Charisse Klinefelter                           Discharge Summary  DISCHARGE DIAGNOSES: 1. Cellulitis of leg. 2. Non-insulin-dependent diabetes mellitus, 250.00. 3. Gastroesophageal reflux disease. 4. Coronary artery disease. 5. Gastritis.  REASON FOR HOSPITALIZATION:  Cellulitis that was worsening in the face of maximal outpatient therapy.  HOSPITAL COURSE:  The patient was admitted to a general medical bed and was begun on intravenous ciprofloxacin antibiotic therapy.  The patients Glucophage was held, and he was begun on Amaryl and sliding scale insulin for diabetic control.  The patient responded rapidly to intravenous therapy and by July 31, the patient was felt ready for discharge home.  CONDITION ON DISCHARGE:  The patient was well appearing, ambulating with ease. His leg was less swollen.  The redness had retreated.  DISCHARGE MEDICATIONS: 1. Tequin 400 q.d. for 5 days. 2. GlucoVance 5/500 b.i.d. 3. Aspirin 81 mg q.d. 4. Zantac 75 mg q.d.  FOLLOWUP:  With Dr. Huey Bienenstock in two weeks following discharge. DD:  05/25/00 TD:  05/27/00 Job: [id number]

## 2011-07-13 LAB — COMPREHENSIVE METABOLIC PANEL
ALT: 11
ALT: 13
ALT: 14
AST: 15
AST: 18
AST: 20
Albumin: 2.4 — ABNORMAL LOW
Albumin: 2.8 — ABNORMAL LOW
Albumin: 3.2 — ABNORMAL LOW
Alkaline Phosphatase: 58
Alkaline Phosphatase: 53
Alkaline Phosphatase: 61
BUN: 9
BUN: 26 — ABNORMAL HIGH
BUN: 29 — ABNORMAL HIGH
CO2: 25
CO2: 19
CO2: 21
Calcium: 8 — ABNORMAL LOW
Calcium: 8.8
Calcium: 9.3
Chloride: 105
Chloride: 109
Chloride: 111
Creatinine, Ser: 1.09
Creatinine, Ser: 1.66 — ABNORMAL HIGH
Creatinine, Ser: 1.68 — ABNORMAL HIGH
GFR calc Af Amer: >60
GFR calc Af Amer: 49 — ABNORMAL LOW
GFR calc Af Amer: 50 — ABNORMAL LOW
GFR calc non Af Amer: >60
GFR calc non Af Amer: 40 — ABNORMAL LOW
GFR calc non Af Amer: 41 — ABNORMAL LOW
Glucose, Bld: 151 — ABNORMAL HIGH
Glucose, Bld: 121 — ABNORMAL HIGH
Glucose, Bld: 156 — ABNORMAL HIGH
Potassium: 3.8
Potassium: 4.8
Potassium: 4.8
Sodium: 136
Sodium: 139
Sodium: 139
Total Bilirubin: 1
Total Bilirubin: 0.5
Total Bilirubin: 0.7
Total Protein: 6
Total Protein: 6.2
Total Protein: 6.7

## 2011-07-13 LAB — MAGNESIUM
Magnesium: 1.3 — ABNORMAL LOW
Magnesium: 1.6

## 2011-07-13 LAB — CBC
HCT: 31.3 — ABNORMAL LOW
HCT: 30.3 — ABNORMAL LOW
HCT: 33 — ABNORMAL LOW
Hemoglobin: 10.7 — ABNORMAL LOW
Hemoglobin: 10.3 — ABNORMAL LOW
Hemoglobin: 11.2 — ABNORMAL LOW
MCHC: 34.2
MCHC: 33.9
MCHC: 34
MCV: 86.2
MCV: 87.6
MCV: 88
Platelets: 202
Platelets: 239
Platelets: 281
RBC: 3.64 — ABNORMAL LOW
RBC: 3.46 — ABNORMAL LOW
RBC: 3.74 — ABNORMAL LOW
RDW: 14.6
RDW: 14.3
RDW: 14.4
WBC: 10.1
WBC: 11 — ABNORMAL HIGH
WBC: 11.4 — ABNORMAL HIGH

## 2011-07-13 LAB — POCT I-STAT, CHEM 8
BUN: 33 — ABNORMAL HIGH
Calcium, Ion: 1.19
Chloride: 109
Creatinine, Ser: 1.7 — ABNORMAL HIGH
Glucose, Bld: 158 — ABNORMAL HIGH
HCT: 35 — ABNORMAL LOW
Hemoglobin: 11.9 — ABNORMAL LOW
Potassium: 4.8
Sodium: 139
TCO2: 19

## 2011-07-13 LAB — DIFFERENTIAL
Basophils Absolute: 0
Basophils Relative: 0
Eosinophils Absolute: 0.1
Eosinophils Relative: 1
Lymphocytes Relative: 11 — ABNORMAL LOW
Lymphs Abs: 1.2
Monocytes Absolute: 0.6
Monocytes Relative: 5
Neutro Abs: 9.1 — ABNORMAL HIGH
Neutrophils Relative %: 83 — ABNORMAL HIGH

## 2011-07-13 LAB — URINALYSIS, ROUTINE W REFLEX MICROSCOPIC
Bilirubin Urine: NEGATIVE
Glucose, UA: 250 — AB
Ketones, ur: NEGATIVE
Nitrite: NEGATIVE
Protein, ur: NEGATIVE
Specific Gravity, Urine: 1.021
Urobilinogen, UA: 0.2
pH: 5

## 2011-07-13 LAB — SODIUM, URINE, RANDOM: Sodium, Ur: 172

## 2011-07-13 LAB — BASIC METABOLIC PANEL
BUN: 10
BUN: 16
CO2: 22
CO2: 22
Calcium: 8.5
Calcium: 8.6
Chloride: 106
Chloride: 109
Creatinine, Ser: 1.1
Creatinine, Ser: 1.74 — ABNORMAL HIGH
GFR calc Af Amer: 47 — ABNORMAL LOW
GFR calc Af Amer: >60
GFR calc non Af Amer: 39 — ABNORMAL LOW
GFR calc non Af Amer: >60
Glucose, Bld: 138 — ABNORMAL HIGH
Glucose, Bld: 153 — ABNORMAL HIGH
Potassium: 3.9
Potassium: 4.4
Sodium: 134 — ABNORMAL LOW
Sodium: 137

## 2011-07-13 LAB — CARDIAC PANEL(CRET KIN+CKTOT+MB+TROPI)
CK, MB: 1.4
CK, MB: 1.5
CK, MB: 1.6
Relative Index: INVALID
Relative Index: INVALID
Relative Index: INVALID
Total CK: 53
Total CK: 53
Total CK: 60
Troponin I: 0.02
Troponin I: 0.03
Troponin I: 0.03

## 2011-07-13 LAB — CREATININE, SERUM
Creatinine, Ser: 1.66 — ABNORMAL HIGH
GFR calc Af Amer: 50 — ABNORMAL LOW
GFR calc non Af Amer: 41 — ABNORMAL LOW

## 2011-07-13 LAB — URINE MICROSCOPIC-ADD ON

## 2011-07-13 LAB — TSH: TSH: 1.284

## 2011-07-13 LAB — PHOSPHORUS: Phosphorus: 4.6

## 2011-07-15 LAB — BASIC METABOLIC PANEL
BUN: 25 — ABNORMAL HIGH
BUN: 26 — ABNORMAL HIGH
BUN: 27 — ABNORMAL HIGH
BUN: 38 — ABNORMAL HIGH
BUN: 44 — ABNORMAL HIGH
BUN: 46 — ABNORMAL HIGH
CO2: 20
CO2: 20
CO2: 21
CO2: 22
CO2: 22
CO2: 23
Calcium: 8.6
Calcium: 8.7
Calcium: 8.8
Calcium: 8.8
Calcium: 8.8
Calcium: 9.9
Chloride: 104
Chloride: 105
Chloride: 105
Chloride: 106
Chloride: 107
Chloride: 107
Creatinine, Ser: 1.33
Creatinine, Ser: 1.37
Creatinine, Ser: 1.42
Creatinine, Ser: 1.47
Creatinine, Ser: 1.94 — ABNORMAL HIGH
Creatinine, Ser: 2.3 — ABNORMAL HIGH
GFR calc Af Amer: 34 — ABNORMAL LOW
GFR calc Af Amer: 41 — ABNORMAL LOW
GFR calc Af Amer: 57 — ABNORMAL LOW
GFR calc Af Amer: 59 — ABNORMAL LOW
GFR calc Af Amer: >60
GFR calc Af Amer: >60
GFR calc non Af Amer: 28 — ABNORMAL LOW
GFR calc non Af Amer: 34 — ABNORMAL LOW
GFR calc non Af Amer: 47 — ABNORMAL LOW
GFR calc non Af Amer: 49 — ABNORMAL LOW
GFR calc non Af Amer: 51 — ABNORMAL LOW
GFR calc non Af Amer: 53 — ABNORMAL LOW
Glucose, Bld: 110 — ABNORMAL HIGH
Glucose, Bld: 113 — ABNORMAL HIGH
Glucose, Bld: 114 — ABNORMAL HIGH
Glucose, Bld: 133 — ABNORMAL HIGH
Glucose, Bld: 136 — ABNORMAL HIGH
Glucose, Bld: 147 — ABNORMAL HIGH
Potassium: 4.3
Potassium: 4.4
Potassium: 4.5
Potassium: 4.5
Potassium: 4.8
Potassium: 4.9
Sodium: 134 — ABNORMAL LOW
Sodium: 135
Sodium: 135
Sodium: 136
Sodium: 137
Sodium: 137

## 2011-07-15 LAB — PROTIME-INR
INR: 1.1
INR: 1.2
INR: 1.3
INR: 1.3
INR: 1.4
INR: 1.6 — ABNORMAL HIGH
INR: 1.8 — ABNORMAL HIGH
Prothrombin Time: 15
Prothrombin Time: 15.8 — ABNORMAL HIGH
Prothrombin Time: 16.6 — ABNORMAL HIGH
Prothrombin Time: 16.7 — ABNORMAL HIGH
Prothrombin Time: 18.2 — ABNORMAL HIGH
Prothrombin Time: 20.1 — ABNORMAL HIGH
Prothrombin Time: 21.8 — ABNORMAL HIGH

## 2011-07-15 LAB — POCT I-STAT, CHEM 8
BUN: 37 — ABNORMAL HIGH
Calcium, Ion: 1.28
Chloride: 108
Creatinine, Ser: 1.5
Glucose, Bld: 145 — ABNORMAL HIGH
HCT: 51
Hemoglobin: 17.3 — ABNORMAL HIGH
Potassium: 4.6
Sodium: 137
TCO2: 21

## 2011-07-15 LAB — URINE MICROSCOPIC-ADD ON

## 2011-07-15 LAB — COMPREHENSIVE METABOLIC PANEL
ALT: 18
AST: 18
Albumin: 3.4 — ABNORMAL LOW
Alkaline Phosphatase: 88
BUN: 36 — ABNORMAL HIGH
CO2: 22
Calcium: 9.6
Chloride: 104
Creatinine, Ser: 1.5
GFR calc Af Amer: 56 — ABNORMAL LOW
GFR calc non Af Amer: 46 — ABNORMAL LOW
Glucose, Bld: 145 — ABNORMAL HIGH
Potassium: 4.8
Sodium: 135
Total Bilirubin: 0.9
Total Protein: 7.1

## 2011-07-15 LAB — CBC
HCT: 26.7 — ABNORMAL LOW
HCT: 29.1 — ABNORMAL LOW
HCT: 29.4 — ABNORMAL LOW
HCT: 29.7 — ABNORMAL LOW
HCT: 30.3 — ABNORMAL LOW
HCT: 31.1 — ABNORMAL LOW
HCT: 31.5 — ABNORMAL LOW
Hemoglobin: 10 — ABNORMAL LOW
Hemoglobin: 10.2 — ABNORMAL LOW
Hemoglobin: 10.4 — ABNORMAL LOW
Hemoglobin: 10.5 — ABNORMAL LOW
Hemoglobin: 9 — ABNORMAL LOW
Hemoglobin: 9.6 — ABNORMAL LOW
Hemoglobin: 9.8 — ABNORMAL LOW
MCHC: 33.1
MCHC: 33.3
MCHC: 33.4
MCHC: 33.5
MCHC: 33.8
MCHC: 33.8
MCHC: 33.8
MCV: 87.7
MCV: 87.9
MCV: 88
MCV: 88.3
MCV: 88.6
MCV: 89.4
MCV: 89.6
Platelets: 226
Platelets: 229
Platelets: 234
Platelets: 246
Platelets: 252
Platelets: 260
Platelets: 320
RBC: 3.02 — ABNORMAL LOW
RBC: 3.25 — ABNORMAL LOW
RBC: 3.29 — ABNORMAL LOW
RBC: 3.39 — ABNORMAL LOW
RBC: 3.44 — ABNORMAL LOW
RBC: 3.52 — ABNORMAL LOW
RBC: 3.58 — ABNORMAL LOW
RDW: 15
RDW: 15
RDW: 15.1
RDW: 15.1
RDW: 15.1
RDW: 15.3
RDW: 15.5
WBC: 10.1
WBC: 10.1
WBC: 8.6
WBC: 9.1
WBC: 9.3
WBC: 9.3
WBC: 9.6

## 2011-07-15 LAB — FERRITIN: Ferritin: 75 (ref 22–322)

## 2011-07-15 LAB — GLUCOSE, CAPILLARY
Glucose-Capillary: 100 — ABNORMAL HIGH
Glucose-Capillary: 120 — ABNORMAL HIGH
Glucose-Capillary: 123 — ABNORMAL HIGH
Glucose-Capillary: 137 — ABNORMAL HIGH
Glucose-Capillary: 160 — ABNORMAL HIGH
Glucose-Capillary: 174 — ABNORMAL HIGH
Glucose-Capillary: 215 — ABNORMAL HIGH
Glucose-Capillary: 89

## 2011-07-15 LAB — HEPARIN LEVEL (UNFRACTIONATED)
Heparin Unfractionated: 0.24 — ABNORMAL LOW
Heparin Unfractionated: 0.4
Heparin Unfractionated: 0.45
Heparin Unfractionated: 0.45
Heparin Unfractionated: 0.59
Heparin Unfractionated: 0.91 — ABNORMAL HIGH
Heparin Unfractionated: 1.03 — ABNORMAL HIGH
Heparin Unfractionated: 1.37 — ABNORMAL HIGH
Heparin Unfractionated: <0.1 — ABNORMAL LOW

## 2011-07-15 LAB — DIFFERENTIAL
Basophils Absolute: 0.1
Basophils Relative: 1
Eosinophils Absolute: 0.1
Eosinophils Relative: 1
Lymphocytes Relative: 16
Lymphs Abs: 1.6
Monocytes Absolute: 0.8
Monocytes Relative: 8
Neutro Abs: 7.5
Neutrophils Relative %: 74

## 2011-07-15 LAB — LIPID PANEL
Cholesterol: 135
HDL: 38 — ABNORMAL LOW
LDL Cholesterol: 72
Total CHOL/HDL Ratio: 3.6
Triglycerides: 127
VLDL: 25

## 2011-07-15 LAB — B-NATRIURETIC PEPTIDE (CONVERTED LAB)
Pro B Natriuretic peptide (BNP): 1060 — ABNORMAL HIGH
Pro B Natriuretic peptide (BNP): 203 — ABNORMAL HIGH
Pro B Natriuretic peptide (BNP): 246 — ABNORMAL HIGH
Pro B Natriuretic peptide (BNP): 392 — ABNORMAL HIGH
Pro B Natriuretic peptide (BNP): 476 — ABNORMAL HIGH
Pro B Natriuretic peptide (BNP): 641 — ABNORMAL HIGH

## 2011-07-15 LAB — APTT: aPTT: 36

## 2011-07-15 LAB — IRON AND TIBC
Iron: 51
Saturation Ratios: 14 — ABNORMAL LOW
TIBC: 359
UIBC: 308

## 2011-07-15 LAB — OCCULT BLOOD X 1 CARD TO LAB, STOOL
Fecal Occult Bld: NEGATIVE
Fecal Occult Bld: POSITIVE
Fecal Occult Bld: POSITIVE
Fecal Occult Bld: POSITIVE
Fecal Occult Bld: POSITIVE
Fecal Occult Bld: POSITIVE
Fecal Occult Bld: POSITIVE
Fecal Occult Bld: POSITIVE

## 2011-07-15 LAB — URINALYSIS, ROUTINE W REFLEX MICROSCOPIC
Bilirubin Urine: NEGATIVE
Glucose, UA: NEGATIVE
Ketones, ur: NEGATIVE
Leukocytes, UA: NEGATIVE
Nitrite: NEGATIVE
Protein, ur: NEGATIVE
Specific Gravity, Urine: 1.024
Urobilinogen, UA: 0.2
pH: 5

## 2011-07-15 LAB — POCT CARDIAC MARKERS
CKMB, poc: 1.3
Myoglobin, poc: 78
Troponin i, poc: <0.05

## 2011-07-15 LAB — RETICULOCYTES
RBC.: 3.81 — ABNORMAL LOW
Retic Count, Absolute: 68.6
Retic Ct Pct: 1.8

## 2011-07-15 LAB — FOLATE: Folate: 10.4

## 2011-07-15 LAB — VITAMIN B12: Vitamin B-12: 228 (ref 211–911)

## 2011-07-20 LAB — GLUCOSE, CAPILLARY
Glucose-Capillary: 116 — ABNORMAL HIGH
Glucose-Capillary: 181 — ABNORMAL HIGH
Glucose-Capillary: 193 — ABNORMAL HIGH
Glucose-Capillary: 35 — CL
Glucose-Capillary: 82
Glucose-Capillary: 94

## 2011-10-24 ENCOUNTER — Other Ambulatory Visit: Payer: Self-pay | Admitting: Internal Medicine

## 2011-10-27 DIAGNOSIS — N182 Chronic kidney disease, stage 2 (mild): Secondary | ICD-10-CM | POA: Diagnosis not present

## 2011-10-27 DIAGNOSIS — K5190 Ulcerative colitis, unspecified, without complications: Secondary | ICD-10-CM | POA: Diagnosis not present

## 2011-10-27 DIAGNOSIS — Z125 Encounter for screening for malignant neoplasm of prostate: Secondary | ICD-10-CM | POA: Diagnosis not present

## 2011-10-27 DIAGNOSIS — D509 Iron deficiency anemia, unspecified: Secondary | ICD-10-CM | POA: Diagnosis not present

## 2011-10-27 DIAGNOSIS — E782 Mixed hyperlipidemia: Secondary | ICD-10-CM | POA: Diagnosis not present

## 2011-10-27 DIAGNOSIS — I10 Essential (primary) hypertension: Secondary | ICD-10-CM | POA: Diagnosis not present

## 2011-10-27 DIAGNOSIS — Z0000 Encounter for general adult medical examination without abnormal findings: Secondary | ICD-10-CM | POA: Diagnosis not present

## 2011-10-27 DIAGNOSIS — E1129 Type 2 diabetes mellitus with other diabetic kidney complication: Secondary | ICD-10-CM | POA: Diagnosis not present

## 2012-01-09 DIAGNOSIS — H35369 Drusen (degenerative) of macula, unspecified eye: Secondary | ICD-10-CM | POA: Diagnosis not present

## 2012-01-09 DIAGNOSIS — E1139 Type 2 diabetes mellitus with other diabetic ophthalmic complication: Secondary | ICD-10-CM | POA: Diagnosis not present

## 2012-01-09 DIAGNOSIS — E11329 Type 2 diabetes mellitus with mild nonproliferative diabetic retinopathy without macular edema: Secondary | ICD-10-CM | POA: Diagnosis not present

## 2012-01-09 DIAGNOSIS — H35049 Retinal micro-aneurysms, unspecified, unspecified eye: Secondary | ICD-10-CM | POA: Diagnosis not present

## 2012-03-05 DIAGNOSIS — E782 Mixed hyperlipidemia: Secondary | ICD-10-CM | POA: Diagnosis not present

## 2012-03-05 DIAGNOSIS — I10 Essential (primary) hypertension: Secondary | ICD-10-CM | POA: Diagnosis not present

## 2012-03-05 DIAGNOSIS — E1129 Type 2 diabetes mellitus with other diabetic kidney complication: Secondary | ICD-10-CM | POA: Diagnosis not present

## 2012-03-05 DIAGNOSIS — K219 Gastro-esophageal reflux disease without esophagitis: Secondary | ICD-10-CM | POA: Diagnosis not present

## 2012-03-05 DIAGNOSIS — N183 Chronic kidney disease, stage 3 unspecified: Secondary | ICD-10-CM | POA: Diagnosis not present

## 2012-03-05 DIAGNOSIS — E669 Obesity, unspecified: Secondary | ICD-10-CM | POA: Diagnosis not present

## 2012-07-09 DIAGNOSIS — I10 Essential (primary) hypertension: Secondary | ICD-10-CM | POA: Diagnosis not present

## 2012-07-09 DIAGNOSIS — K5190 Ulcerative colitis, unspecified, without complications: Secondary | ICD-10-CM | POA: Diagnosis not present

## 2012-07-09 DIAGNOSIS — E782 Mixed hyperlipidemia: Secondary | ICD-10-CM | POA: Diagnosis not present

## 2012-07-09 DIAGNOSIS — E669 Obesity, unspecified: Secondary | ICD-10-CM | POA: Diagnosis not present

## 2012-07-09 DIAGNOSIS — N183 Chronic kidney disease, stage 3 unspecified: Secondary | ICD-10-CM | POA: Diagnosis not present

## 2012-07-09 DIAGNOSIS — E1129 Type 2 diabetes mellitus with other diabetic kidney complication: Secondary | ICD-10-CM | POA: Diagnosis not present

## 2012-07-09 DIAGNOSIS — Z23 Encounter for immunization: Secondary | ICD-10-CM | POA: Diagnosis not present

## 2012-10-08 DIAGNOSIS — I119 Hypertensive heart disease without heart failure: Secondary | ICD-10-CM | POA: Diagnosis not present

## 2012-10-08 DIAGNOSIS — R0602 Shortness of breath: Secondary | ICD-10-CM | POA: Diagnosis not present

## 2012-10-08 DIAGNOSIS — I4891 Unspecified atrial fibrillation: Secondary | ICD-10-CM | POA: Diagnosis not present

## 2012-10-16 DIAGNOSIS — I119 Hypertensive heart disease without heart failure: Secondary | ICD-10-CM | POA: Diagnosis not present

## 2012-11-16 DIAGNOSIS — I10 Essential (primary) hypertension: Secondary | ICD-10-CM | POA: Diagnosis not present

## 2012-11-16 DIAGNOSIS — Z0000 Encounter for general adult medical examination without abnormal findings: Secondary | ICD-10-CM | POA: Diagnosis not present

## 2012-11-16 DIAGNOSIS — Z1331 Encounter for screening for depression: Secondary | ICD-10-CM | POA: Diagnosis not present

## 2012-11-16 DIAGNOSIS — Z125 Encounter for screening for malignant neoplasm of prostate: Secondary | ICD-10-CM | POA: Diagnosis not present

## 2012-11-16 DIAGNOSIS — K5190 Ulcerative colitis, unspecified, without complications: Secondary | ICD-10-CM | POA: Diagnosis not present

## 2012-11-16 DIAGNOSIS — N183 Chronic kidney disease, stage 3 unspecified: Secondary | ICD-10-CM | POA: Diagnosis not present

## 2012-11-16 DIAGNOSIS — E782 Mixed hyperlipidemia: Secondary | ICD-10-CM | POA: Diagnosis not present

## 2012-11-16 DIAGNOSIS — E669 Obesity, unspecified: Secondary | ICD-10-CM | POA: Diagnosis not present

## 2012-11-16 DIAGNOSIS — E1129 Type 2 diabetes mellitus with other diabetic kidney complication: Secondary | ICD-10-CM | POA: Diagnosis not present

## 2013-01-24 DIAGNOSIS — H353190 Nonexudative age-related macular degeneration, unspecified eye, stage unspecified: Secondary | ICD-10-CM | POA: Diagnosis not present

## 2013-01-24 DIAGNOSIS — E11329 Type 2 diabetes mellitus with mild nonproliferative diabetic retinopathy without macular edema: Secondary | ICD-10-CM | POA: Diagnosis not present

## 2013-01-24 DIAGNOSIS — E1139 Type 2 diabetes mellitus with other diabetic ophthalmic complication: Secondary | ICD-10-CM | POA: Diagnosis not present

## 2013-03-08 DIAGNOSIS — I10 Essential (primary) hypertension: Secondary | ICD-10-CM | POA: Diagnosis not present

## 2013-03-08 DIAGNOSIS — K219 Gastro-esophageal reflux disease without esophagitis: Secondary | ICD-10-CM | POA: Diagnosis not present

## 2013-03-08 DIAGNOSIS — E782 Mixed hyperlipidemia: Secondary | ICD-10-CM | POA: Diagnosis not present

## 2013-03-08 DIAGNOSIS — N183 Chronic kidney disease, stage 3 unspecified: Secondary | ICD-10-CM | POA: Diagnosis not present

## 2013-03-08 DIAGNOSIS — E669 Obesity, unspecified: Secondary | ICD-10-CM | POA: Diagnosis not present

## 2013-03-08 DIAGNOSIS — E1129 Type 2 diabetes mellitus with other diabetic kidney complication: Secondary | ICD-10-CM | POA: Diagnosis not present

## 2013-06-05 ENCOUNTER — Other Ambulatory Visit: Payer: Self-pay | Admitting: Internal Medicine

## 2013-06-05 ENCOUNTER — Ambulatory Visit
Admission: RE | Admit: 2013-06-05 | Discharge: 2013-06-05 | Disposition: A | Discharge status: Home / Self Care | Payer: Medicare Other | Source: Ambulatory Visit | Attending: Internal Medicine | Admitting: Internal Medicine

## 2013-06-05 DIAGNOSIS — M79609 Pain in unspecified limb: Secondary | ICD-10-CM | POA: Diagnosis not present

## 2013-06-05 DIAGNOSIS — M79605 Pain in left leg: Secondary | ICD-10-CM

## 2013-06-05 DIAGNOSIS — M7989 Other specified soft tissue disorders: Secondary | ICD-10-CM | POA: Diagnosis not present

## 2013-06-14 DIAGNOSIS — M25579 Pain in unspecified ankle and joints of unspecified foot: Secondary | ICD-10-CM | POA: Diagnosis not present

## 2013-06-18 DIAGNOSIS — M25569 Pain in unspecified knee: Secondary | ICD-10-CM | POA: Diagnosis not present

## 2013-06-18 DIAGNOSIS — M25579 Pain in unspecified ankle and joints of unspecified foot: Secondary | ICD-10-CM | POA: Diagnosis not present

## 2013-06-18 DIAGNOSIS — M25559 Pain in unspecified hip: Secondary | ICD-10-CM | POA: Diagnosis not present

## 2013-06-20 ENCOUNTER — Other Ambulatory Visit (HOSPITAL_COMMUNITY): Payer: Self-pay | Admitting: Orthopedic Surgery

## 2013-06-20 DIAGNOSIS — M7989 Other specified soft tissue disorders: Secondary | ICD-10-CM

## 2013-06-20 DIAGNOSIS — M25562 Pain in left knee: Secondary | ICD-10-CM

## 2013-06-24 ENCOUNTER — Ambulatory Visit (HOSPITAL_COMMUNITY)
Admission: RE | Admit: 2013-06-24 | Discharge: 2013-06-24 | Disposition: A | Discharge status: Home / Self Care | Payer: Medicare Other | Source: Ambulatory Visit | Attending: Orthopedic Surgery | Admitting: Orthopedic Surgery

## 2013-06-24 DIAGNOSIS — R29898 Other symptoms and signs involving the musculoskeletal system: Secondary | ICD-10-CM | POA: Diagnosis not present

## 2013-06-24 DIAGNOSIS — N289 Disorder of kidney and ureter, unspecified: Secondary | ICD-10-CM | POA: Insufficient documentation

## 2013-06-24 DIAGNOSIS — E119 Type 2 diabetes mellitus without complications: Secondary | ICD-10-CM | POA: Insufficient documentation

## 2013-06-24 DIAGNOSIS — M7989 Other specified soft tissue disorders: Secondary | ICD-10-CM

## 2013-06-24 DIAGNOSIS — M25562 Pain in left knee: Secondary | ICD-10-CM

## 2013-06-24 DIAGNOSIS — M79609 Pain in unspecified limb: Secondary | ICD-10-CM | POA: Insufficient documentation

## 2013-06-24 NOTE — Progress Notes (Signed)
VASCULAR LAB PRELIMINARY  ARTERIAL  ABI completed:    RIGHT    LEFT    PRESSURE WAVEFORM  PRESSURE WAVEFORM  BRACHIAL 149 Triphasic BRACHIAL 147 Biphasic  DP 165 Biphasic DP 182 Biphasic  PT 177 Biphasic PT 198 Biphasic    RIGHT LEFT  ABI 1.19 1.33   ABI on the right indicates normal arterial flow. Left ABI indicates a very early evidence of calcification. Doppler waveforms are within normal limits bilaterally   [name] [name], RVS 06/24/2013, 11:27 AM

## 2013-07-04 DIAGNOSIS — M818 Other osteoporosis without current pathological fracture: Secondary | ICD-10-CM | POA: Diagnosis not present

## 2013-07-04 DIAGNOSIS — M48061 Spinal stenosis, lumbar region without neurogenic claudication: Secondary | ICD-10-CM | POA: Diagnosis not present

## 2013-07-10 DIAGNOSIS — E782 Mixed hyperlipidemia: Secondary | ICD-10-CM | POA: Diagnosis not present

## 2013-07-10 DIAGNOSIS — E1129 Type 2 diabetes mellitus with other diabetic kidney complication: Secondary | ICD-10-CM | POA: Diagnosis not present

## 2013-07-10 DIAGNOSIS — I10 Essential (primary) hypertension: Secondary | ICD-10-CM | POA: Diagnosis not present

## 2013-07-10 DIAGNOSIS — K5190 Ulcerative colitis, unspecified, without complications: Secondary | ICD-10-CM | POA: Diagnosis not present

## 2013-07-10 DIAGNOSIS — N183 Chronic kidney disease, stage 3 unspecified: Secondary | ICD-10-CM | POA: Diagnosis not present

## 2013-07-10 DIAGNOSIS — R29898 Other symptoms and signs involving the musculoskeletal system: Secondary | ICD-10-CM | POA: Diagnosis not present

## 2013-07-18 DIAGNOSIS — M47817 Spondylosis without myelopathy or radiculopathy, lumbosacral region: Secondary | ICD-10-CM | POA: Diagnosis not present

## 2013-07-23 DIAGNOSIS — M48061 Spinal stenosis, lumbar region without neurogenic claudication: Secondary | ICD-10-CM | POA: Diagnosis not present

## 2013-07-23 DIAGNOSIS — IMO0002 Reserved for concepts with insufficient information to code with codable children: Secondary | ICD-10-CM | POA: Diagnosis not present

## 2013-07-29 DIAGNOSIS — M48061 Spinal stenosis, lumbar region without neurogenic claudication: Secondary | ICD-10-CM | POA: Diagnosis not present

## 2013-07-29 DIAGNOSIS — IMO0002 Reserved for concepts with insufficient information to code with codable children: Secondary | ICD-10-CM | POA: Diagnosis not present

## 2013-08-05 DIAGNOSIS — IMO0002 Reserved for concepts with insufficient information to code with codable children: Secondary | ICD-10-CM | POA: Diagnosis not present

## 2013-08-05 DIAGNOSIS — M48061 Spinal stenosis, lumbar region without neurogenic claudication: Secondary | ICD-10-CM | POA: Diagnosis not present

## 2013-08-07 DIAGNOSIS — Z23 Encounter for immunization: Secondary | ICD-10-CM | POA: Diagnosis not present

## 2013-08-07 DIAGNOSIS — M48061 Spinal stenosis, lumbar region without neurogenic claudication: Secondary | ICD-10-CM | POA: Diagnosis not present

## 2013-08-07 DIAGNOSIS — IMO0002 Reserved for concepts with insufficient information to code with codable children: Secondary | ICD-10-CM | POA: Diagnosis not present

## 2013-08-12 DIAGNOSIS — IMO0002 Reserved for concepts with insufficient information to code with codable children: Secondary | ICD-10-CM | POA: Diagnosis not present

## 2013-08-12 DIAGNOSIS — M48061 Spinal stenosis, lumbar region without neurogenic claudication: Secondary | ICD-10-CM | POA: Diagnosis not present

## 2013-08-14 DIAGNOSIS — M48061 Spinal stenosis, lumbar region without neurogenic claudication: Secondary | ICD-10-CM | POA: Diagnosis not present

## 2013-08-14 DIAGNOSIS — IMO0002 Reserved for concepts with insufficient information to code with codable children: Secondary | ICD-10-CM | POA: Diagnosis not present

## 2013-08-20 DIAGNOSIS — M48061 Spinal stenosis, lumbar region without neurogenic claudication: Secondary | ICD-10-CM | POA: Diagnosis not present

## 2013-08-22 DIAGNOSIS — IMO0002 Reserved for concepts with insufficient information to code with codable children: Secondary | ICD-10-CM | POA: Diagnosis not present

## 2013-08-22 DIAGNOSIS — M48061 Spinal stenosis, lumbar region without neurogenic claudication: Secondary | ICD-10-CM | POA: Diagnosis not present

## 2013-08-27 DIAGNOSIS — IMO0002 Reserved for concepts with insufficient information to code with codable children: Secondary | ICD-10-CM | POA: Diagnosis not present

## 2013-08-27 DIAGNOSIS — M48061 Spinal stenosis, lumbar region without neurogenic claudication: Secondary | ICD-10-CM | POA: Diagnosis not present

## 2013-08-29 DIAGNOSIS — IMO0002 Reserved for concepts with insufficient information to code with codable children: Secondary | ICD-10-CM | POA: Diagnosis not present

## 2013-08-29 DIAGNOSIS — M48061 Spinal stenosis, lumbar region without neurogenic claudication: Secondary | ICD-10-CM | POA: Diagnosis not present

## 2013-09-02 DIAGNOSIS — M48061 Spinal stenosis, lumbar region without neurogenic claudication: Secondary | ICD-10-CM | POA: Diagnosis not present

## 2013-09-02 DIAGNOSIS — IMO0002 Reserved for concepts with insufficient information to code with codable children: Secondary | ICD-10-CM | POA: Diagnosis not present

## 2013-09-03 DIAGNOSIS — M48061 Spinal stenosis, lumbar region without neurogenic claudication: Secondary | ICD-10-CM | POA: Diagnosis not present

## 2013-10-02 ENCOUNTER — Encounter: Payer: Self-pay | Admitting: Cardiology

## 2013-10-02 ENCOUNTER — Encounter: Payer: Self-pay | Admitting: *Deleted

## 2013-10-02 ENCOUNTER — Encounter: Payer: Self-pay | Admitting: General Surgery

## 2013-10-02 DIAGNOSIS — I4891 Unspecified atrial fibrillation: Secondary | ICD-10-CM

## 2013-10-02 DIAGNOSIS — I119 Hypertensive heart disease without heart failure: Secondary | ICD-10-CM

## 2013-10-02 DIAGNOSIS — R0602 Shortness of breath: Secondary | ICD-10-CM | POA: Insufficient documentation

## 2013-10-02 DIAGNOSIS — K5190 Ulcerative colitis, unspecified, without complications: Secondary | ICD-10-CM | POA: Insufficient documentation

## 2013-10-03 ENCOUNTER — Encounter (INDEPENDENT_AMBULATORY_CARE_PROVIDER_SITE_OTHER): Payer: Self-pay

## 2013-10-03 ENCOUNTER — Ambulatory Visit (INDEPENDENT_AMBULATORY_CARE_PROVIDER_SITE_OTHER): Payer: Medicare Other | Admitting: Cardiology

## 2013-10-03 ENCOUNTER — Encounter: Payer: Self-pay | Admitting: Cardiology

## 2013-10-03 VITALS — BP 151/61 | HR 62 | Ht 66.0 in | Wt 267.0 lb

## 2013-10-03 DIAGNOSIS — I119 Hypertensive heart disease without heart failure: Secondary | ICD-10-CM

## 2013-10-03 DIAGNOSIS — I4891 Unspecified atrial fibrillation: Secondary | ICD-10-CM

## 2013-10-03 NOTE — Patient Instructions (Signed)
Your physician recommends that you continue on your current medications as directed. Please refer to the Current Medication list given to you today.  Your physician wants you to follow-up in: 6 Months with Dr Turner You will receive a reminder letter in the mail two months in advance. If you don't receive a letter, please call our office to schedule the follow-up appointment.  

## 2013-10-03 NOTE — Progress Notes (Signed)
[facility], [place], [place]  [address] Phone: [phone] Fax:  [phone]  Date:  10/03/2013   ID:  [name] [name], DOB 02-01-1933, MRN [id number]  PCP:  [name] Low, MD  Cardiologist:  [name] Him, MD     History of Present Illness: [name] [name] is a 79 y.o. male with a history of PAF, HTN and chronic SOB.  He is doing well.  He denies any chest pain, LE edema, dizziness, palpitations or syncope.  He has chronic SOB which is stable.   Wt Readings from Last 3 Encounters:  10/03/13 267 lb (121.11 kg)  10/02/13 263 lb 6.4 oz (119.477 kg)     Past Medical History  Diagnosis Date  . Diabetes   . Obesity   . CAD (coronary artery disease)   . Ulcerative colitis   . HTN (hypertension)   . Dyslipidemia   . GERD (gastroesophageal reflux disease)   . History of peptic ulcer disease   . Ulcerative colitis     s/p total colectomy  . Anemia   . Macular degeneration   . Kidney stone may 2009    Right hydronephrosis, S/P stone removal  . Small bowel obstruction, partial 2009    Episode  . Shingles     episode  . Compression fracture of L1 lumbar vertebra   . Osteopenia   . Lower back pain   . OA (osteoarthritis) of hip   . CKD (chronic kidney disease), stage III   . Hx of acute renal failure 01/02/10-3/24-11    due to hypovolemic shock, gastroenteritis and dehydration,hospitalized . Did requre a few days of dialysis. Cr at discharge was 1.8  . A-fib     s/p ablation, NSR  . Asymptomatic PVCs     Current Outpatient Prescriptions  Medication Sig Dispense Refill  . aspirin 81 MG tablet Take 81 mg by mouth daily.      . calcium carbonate (OS-CAL) 600 MG TABS tablet Take 600 mg by mouth daily with breakfast.      . metoprolol (LOPRESSOR) 50 MG tablet Take 75 mg by mouth 2 (two) times daily.      . Omega-3 Fatty Acids (FISH OIL) 1000 MG CAPS Take 1 capsule by mouth daily.      . pioglitazone (ACTOS) 30 MG tablet Take 30 mg by mouth daily.      . ranitidine  (ZANTAC) 75 MG tablet Take 75 mg by mouth daily.      . simvastatin (ZOCOR) 20 MG tablet Take 20 mg by mouth daily.      . sitaGLIPtin (JANUVIA) 100 MG tablet Take 100 mg by mouth daily.       No current facility-administered medications for this visit.    Allergies:    Allergies  Allergen Reactions  . Penicillins Rash  . Sulfa Antibiotics Rash    Social History:  The patient  reports that he has never smoked. He does not have any smokeless tobacco history on file. He reports that he does not drink alcohol or use illicit drugs.   Family History:  The patient's family history includes Colon cancer in his mother.   ROS:  Please see the history of present illness.      All other systems reviewed and negative.   PHYSICAL EXAM: VS:  BP 151/61  Pulse 62  Ht 5' 6"  (1.676 m)  Wt 267 lb (121.11 kg)  BMI 43.12 kg/m2 Well nourished, well developed, in no acute distress HEENT: normal  Neck: no JVD Cardiac:  normal S1, S2; RRR; no murmur Lungs:  clear to auscultation bilaterally, no wheezing, rhonchi or rales Abd: soft, nontender, no hepatomegaly Ext: trace edema Skin: warm and dry Neuro:  CNs 2-12 intact, no focal abnormalities noted  EKG:  NSR with PVC's , LAFB, LVH   ASSESSMENT AND PLAN:  1. PAF maintaining NSR s/p ablation  - continue ASA/metoprolol 2. HTN - mildly elevated today - he has not taken his meds yet today  - I have asked [name] to check his BP a few times at the drug store and call with results 3. Obesity 4. Chronic SOB  Followup with me in 6 months  Signed, [name] Him, MD 10/03/2013 9:37 AM

## 2013-11-07 DIAGNOSIS — E1129 Type 2 diabetes mellitus with other diabetic kidney complication: Secondary | ICD-10-CM | POA: Diagnosis not present

## 2013-11-07 DIAGNOSIS — N183 Chronic kidney disease, stage 3 unspecified: Secondary | ICD-10-CM | POA: Diagnosis not present

## 2013-11-07 DIAGNOSIS — E782 Mixed hyperlipidemia: Secondary | ICD-10-CM | POA: Diagnosis not present

## 2013-11-07 DIAGNOSIS — Z0000 Encounter for general adult medical examination without abnormal findings: Secondary | ICD-10-CM | POA: Diagnosis not present

## 2013-11-07 DIAGNOSIS — K219 Gastro-esophageal reflux disease without esophagitis: Secondary | ICD-10-CM | POA: Diagnosis not present

## 2013-11-07 DIAGNOSIS — Z1331 Encounter for screening for depression: Secondary | ICD-10-CM | POA: Diagnosis not present

## 2013-11-07 DIAGNOSIS — I10 Essential (primary) hypertension: Secondary | ICD-10-CM | POA: Diagnosis not present

## 2013-12-02 DIAGNOSIS — E785 Hyperlipidemia, unspecified: Secondary | ICD-10-CM | POA: Diagnosis not present

## 2013-12-02 DIAGNOSIS — Z79899 Other long term (current) drug therapy: Secondary | ICD-10-CM | POA: Diagnosis not present

## 2014-01-23 DIAGNOSIS — E11329 Type 2 diabetes mellitus with mild nonproliferative diabetic retinopathy without macular edema: Secondary | ICD-10-CM | POA: Diagnosis not present

## 2014-01-23 DIAGNOSIS — H353190 Nonexudative age-related macular degeneration, unspecified eye, stage unspecified: Secondary | ICD-10-CM | POA: Diagnosis not present

## 2014-01-23 DIAGNOSIS — E1139 Type 2 diabetes mellitus with other diabetic ophthalmic complication: Secondary | ICD-10-CM | POA: Diagnosis not present

## 2014-05-02 ENCOUNTER — Telehealth: Payer: Self-pay

## 2014-05-02 NOTE — Telephone Encounter (Signed)
Not on pts med list. PCP has filled in the past.

## 2014-05-06 ENCOUNTER — Other Ambulatory Visit: Payer: Self-pay

## 2014-05-06 MED ORDER — METOPROLOL TARTRATE 50 MG PO TABS: 75.0000 mg | ORAL_TABLET | Freq: Two times a day (BID) | ORAL | Status: DC

## 2014-05-08 ENCOUNTER — Telehealth: Payer: Self-pay

## 2014-05-08 ENCOUNTER — Other Ambulatory Visit: Payer: Self-pay

## 2014-05-08 MED ORDER — AMLODIPINE BESYLATE 5 MG PO TABS: 5.0000 mg | ORAL_TABLET | Freq: Every day | ORAL | Status: DC

## 2014-05-08 NOTE — Telephone Encounter (Signed)
Pt should be on amlodipine 5 MG ok to refill 90 day supply with 3 refills.

## 2014-05-09 ENCOUNTER — Other Ambulatory Visit: Payer: Self-pay

## 2014-05-09 MED ORDER — AMLODIPINE BESYLATE 5 MG PO TABS: 5.0000 mg | ORAL_TABLET | Freq: Every day | ORAL | Status: DC

## 2014-05-15 ENCOUNTER — Encounter: Payer: Self-pay | Admitting: Cardiology

## 2014-05-15 ENCOUNTER — Ambulatory Visit (INDEPENDENT_AMBULATORY_CARE_PROVIDER_SITE_OTHER): Payer: Medicare Other | Admitting: Cardiology

## 2014-05-15 VITALS — BP 140/74 | HR 68 | Ht 70.0 in | Wt 262.0 lb

## 2014-05-15 DIAGNOSIS — R0602 Shortness of breath: Secondary | ICD-10-CM

## 2014-05-15 DIAGNOSIS — I4891 Unspecified atrial fibrillation: Secondary | ICD-10-CM | POA: Diagnosis not present

## 2014-05-15 DIAGNOSIS — E669 Obesity, unspecified: Secondary | ICD-10-CM

## 2014-05-15 DIAGNOSIS — I480 Paroxysmal atrial fibrillation: Secondary | ICD-10-CM

## 2014-05-15 DIAGNOSIS — I10 Essential (primary) hypertension: Secondary | ICD-10-CM | POA: Diagnosis not present

## 2014-05-15 NOTE — Patient Instructions (Signed)
Your physician recommends that you continue on your current medications as directed. Please refer to the Current Medication list given to you today.  Your physician has requested that you have a lexiscan myoview. For further information please visit [email or web address]. Please follow instruction sheet, as given.  Your physician has requested that you have an echocardiogram. Echocardiography is a painless test that uses sound waves to create images of your heart. It provides your doctor with information about the size and shape of your heart and how well your heart's chambers and valves are working. This procedure takes approximately one hour. There are no restrictions for this procedure.  Your physician wants you to follow-up in: 6 months with Dr Mallie Snooks will receive a reminder letter in the mail two months in advance. If you don't receive a letter, please call our office to schedule the follow-up appointment.

## 2014-05-15 NOTE — Progress Notes (Signed)
[place], [address] [place], [place]  [address] Phone: [phone] Fax:  [phone]  Date:  05/15/2014   ID:  [name] [name], DOB Jan 22, 1933, MRN [id number]  PCP:  [name], MD  Cardiologist:  [name], MD     History of Present Illness: [name] [name] is a 80 y.o. male with a history of PAF, HTN and chronic SOB. He is doing well. He denies any chest pain, LE edema, dizziness, palpitations or syncope. He has chronic SOB which significantly limits is activities.    Wt Readings from Last 3 Encounters:  05/15/14 262 lb (118.842 kg)  10/03/13 267 lb (121.11 kg)  10/02/13 263 lb 6.4 oz (119.477 kg)     Past Medical History  Diagnosis Date  . Diabetes   . Obesity   . CAD (coronary artery disease)   . Ulcerative colitis   . HTN (hypertension)   . Dyslipidemia   . GERD (gastroesophageal reflux disease)   . History of peptic ulcer disease   . Ulcerative colitis     s/p total colectomy  . Anemia   . Macular degeneration   . Kidney stone may 2009    Right hydronephrosis, S/P stone removal  . Small bowel obstruction, partial 2009    Episode  . Shingles     episode  . Compression fracture of L1 lumbar vertebra   . Osteopenia   . Lower back pain   . OA (osteoarthritis) of hip   . CKD (chronic kidney disease), stage III   . Hx of acute renal failure 01/02/10-3/24-11    due to hypovolemic shock, gastroenteritis and dehydration,hospitalized . Did requre a few days of dialysis. Cr at discharge was 1.8  . A-fib     s/p ablation, NSR  . Asymptomatic PVCs     Current Outpatient Prescriptions  Medication Sig Dispense Refill  . amLODipine (NORVASC) 5 MG tablet Take 1 tablet (5 mg total) by mouth daily.  90 tablet  3  . aspirin 81 MG tablet Take 81 mg by mouth daily.      . calcium carbonate (OS-CAL) 600 MG TABS tablet Take 600 mg by mouth daily with breakfast.      . glyBURIDE-metformin (GLUCOVANCE) 5-500 MG per tablet       . metoprolol (LOPRESSOR) 50 MG tablet  Take 1.5 tablets (75 mg total) by mouth 2 (two) times daily.  90 tablet  6  . Omega-3 Fatty Acids (FISH OIL) 1000 MG CAPS Take 1 capsule by mouth daily.      . ONGLYZA 5 MG TABS tablet       . pioglitazone (ACTOS) 30 MG tablet Take 30 mg by mouth daily.      . ranitidine (ZANTAC) 75 MG tablet Take 75 mg by mouth daily.      . simvastatin (ZOCOR) 20 MG tablet Take 20 mg by mouth daily.      . sitaGLIPtin (JANUVIA) 100 MG tablet Take 100 mg by mouth daily.       No current facility-administered medications for this visit.    Allergies:    Allergies  Allergen Reactions  . Penicillins Rash  . Sulfa Antibiotics Rash    Social History:  The patient  reports that he has never smoked. He does not have any smokeless tobacco history on file. He reports that he does not drink alcohol or use illicit drugs.   Family History:  The patient's family history includes Colon cancer in his mother.   ROS:  Please see the history of present illness.      All other systems reviewed and negative.   PHYSICAL EXAM: VS:  BP 140/74  Pulse 68  Ht 5' 10"  (1.778 m)  Wt 262 lb (118.842 kg)  BMI 37.59 kg/m2 Well nourished, well developed, in no acute distress HEENT: normal Neck: no JVD Cardiac:  normal S1, S2; RRR; no murmur Lungs:  clear to auscultation bilaterally, no wheezing, rhonchi or rales Abd: soft, nontender, no hepatomegaly Ext: no edema Skin: warm and dry Neuro:  CNs 2-12 intact, no focal abnormalities noted       ASSESSMENT AND PLAN:  1. PAF maintaining NSR s/p ablation - continue ASA/metoprolol  3. HTN - controlled - continue Amlodipine/metoprolol 4. Obesity 5. Chronic SOB - he is not able to do a lot of things he wants to do.  I will get 2D echo to assess LVF and Lexiscan myoview to assess for ischemia.    Followup with me in 6 months  Signed, [name], MD 05/15/2014 9:05 AM

## 2014-05-28 ENCOUNTER — Ambulatory Visit (HOSPITAL_COMMUNITY): Payer: Medicare Other | Attending: Cardiovascular Disease

## 2014-05-28 ENCOUNTER — Ambulatory Visit (HOSPITAL_BASED_OUTPATIENT_CLINIC_OR_DEPARTMENT_OTHER): Payer: Medicare Other | Admitting: Radiology

## 2014-05-28 VITALS — BP 143/70 | HR 60 | Ht 66.0 in | Wt 258.0 lb

## 2014-05-28 DIAGNOSIS — I2510 Atherosclerotic heart disease of native coronary artery without angina pectoris: Secondary | ICD-10-CM | POA: Diagnosis not present

## 2014-05-28 DIAGNOSIS — R079 Chest pain, unspecified: Secondary | ICD-10-CM

## 2014-05-28 DIAGNOSIS — R0602 Shortness of breath: Secondary | ICD-10-CM | POA: Insufficient documentation

## 2014-05-28 MED ORDER — TECHNETIUM TC 99M SESTAMIBI GENERIC - CARDIOLITE
33.0000 | Freq: Once | INTRAVENOUS | Status: AC | PRN
  Administered 2014-05-28: 33 via INTRAVENOUS

## 2014-05-28 MED ORDER — REGADENOSON 0.4 MG/5ML IV SOLN
0.4000 mg | Freq: Once | INTRAVENOUS | Status: AC
  Administered 2014-05-28: 0.4 mg via INTRAVENOUS

## 2014-05-28 MED ORDER — TECHNETIUM TC 99M SESTAMIBI GENERIC - CARDIOLITE
11.0000 | Freq: Once | INTRAVENOUS | Status: AC | PRN
  Administered 2014-05-28: 11 via INTRAVENOUS

## 2014-05-28 NOTE — Progress Notes (Signed)
2D Echo completed. 05/28/2014

## 2014-05-28 NOTE — Progress Notes (Signed)
[place] [facility] [address] [place], [place] [address] [phone]    Cardiology Nuclear Med Study  [name] [name] is a 80 y.o. male     MRN : [id number]     DOB: [birth year]  Procedure Date: 05/28/2014  Nuclear Med Background Indication for Stress Test:  Evaluation for Ischemia History:  CAD, Afib, TEE 2009 EF 50-55% Cardiac Risk Factors: Hypertension, Lipids and NIDDM  Symptoms:  DOE and SOB   Nuclear Pre-Procedure Caffeine/Decaff Intake:  None NPO After: 6 pm   Lungs:  clear O2 Sat: 94% on room air. IV 0.9% NS with Angio Cath:  22g  IV Site: L Hand  IV Started by:  Crissie Figures, RN  Chest Size (in):  50+ Cup Size: n/a  Height: 5' 6"  (1.676 m)  Weight:  258 lb (117.028 kg)  BMI:  Body mass index is 41.66 kg/(m^2). Tech Comments:  N/A    Nuclear Med Study 1 or 2 day study: 1 day  Stress Test Type:  Lexiscan  Reading MD: N/A  Order Authorizing Provider:  [name], MD  Resting Radionuclide: Technetium 18mSestamibi  Resting Radionuclide Dose: 11.0 mCi   Stress Radionuclide:  Technetium 912mestamibi  Stress Radionuclide Dose: 33.0 mCi           Stress Protocol Rest HR: 60 Stress HR: 71  Rest BP: 143/70 Stress BP: 131/64  Exercise Time (min): n/a METS: n/a           Dose of Adenosine (mg):  n/a Dose of Lexiscan: 0.4 mg  Dose of Atropine (mg): n/a Dose of Dobutamine: n/a mcg/kg/min (at max HR)  Stress Test Technologist: ShGlade LloydBS-ES  Nuclear Technologist:  ToAnnye RuskCNMT     Rest Procedure:  Myocardial perfusion imaging was performed at rest 45 minutes following the intravenous administration of Technetium 9943mstamibi. Rest ECG:NSR, LAFB  Stress Procedure:  The patient received IV Lexiscan 0.4 mg over 15-seconds.  Technetium 86m62mtamibi injected at 30-seconds.  Quantitative spect images were obtained after a 45 minute delay.  During the infusion of Lexiscan the patient complained of SOB that resolved in  recovery.  Stress ECG: No significant change from baseline ECG  QPS Raw Data Images:  There is interference from nuclear activity from structures below the diaphragm. This does not affect the ability to read the study. Stress Images:  There is decreased uptake in the basal to apical anterior and inefrior walls.  Rest Images:  There is decreased uptake in the basal to apical inferior wall.  Subtraction (SDS):  There are two defects - medium size, mild severity reversible defect in the anterior wall (SDS 3) and a small fixed defect in the apical inferior wall with reversible defect in the basal and mid inferior walls (SDS 3).  Transient Ischemic Dilatation (Normal <1.22):  1.11 Lung/Heart Ratio (Normal <0.45):  0.37  Quantitative Gated Spect Images QGS EDV:  183 ml QGS ESV:  96 ml  Impression Exercise Capacity:  Lexiscan with no exercise. BP Response:  Normal blood pressure response. Clinical Symptoms:  There is dyspnea. ECG Impression:  No significant ST segment change suggestive of ischemia. Comparison with Prior Nuclear Study: No previous nuclear study performed  Overall Impression:  Intermediate risk stress nuclear study with medium size mild severity ischemia in the LAD territory and a small scar with periinfarct ischemia in the PDA territory (total SDS 6). .  LV Ejection Fraction: 48%.  LV Wall Motion:  Mild  global hypokinesis.    Dorothy Spark 05/28/2014

## 2014-05-30 ENCOUNTER — Telehealth: Payer: Self-pay | Admitting: Cardiology

## 2014-05-30 DIAGNOSIS — I272 Pulmonary hypertension, unspecified: Secondary | ICD-10-CM

## 2014-05-30 DIAGNOSIS — J810 Acute pulmonary edema: Secondary | ICD-10-CM

## 2014-05-30 NOTE — Telephone Encounter (Signed)
New message     Want stress test results

## 2014-05-30 NOTE — Telephone Encounter (Signed)
Informed pt of echo results and Dr Landis Gandy recommendations.   Per Dr Radford Pax echo showed normal LVF with mildly thickened heart muscle and mildly elevated PA pressures - repeat echo in 1 year to follow pulmonary HTN.  Also per Dr Radford Pax, informed the pt that he needs PFTs and chest CT angio to rule out PE due to pulmonary HTN.   Also per Dr Radford Pax the pt needs to be set up for a split night PSG to rule out OSA as an etiology of pulmonary HTN.   Pt states he would like a call back from [facility], Dr Landis Gandy CMA to go over in depth Dr Theodosia Blender orders.   Pt states he's not wanting to do a split night study unless its absolutely necessary.   Informed pt that with the chest CT angio he will need to come in for lab work (bmet)  Informed pt that I will route this message to [facility] CMA to follow-up with the pt and have tests arranged.   I will place orders in epic accordingly.   Will send [facility] a message to set up and arrange.

## 2014-05-30 NOTE — Telephone Encounter (Signed)
F/u    Pt calling previous message

## 2014-06-02 ENCOUNTER — Ambulatory Visit (INDEPENDENT_AMBULATORY_CARE_PROVIDER_SITE_OTHER): Payer: Medicare Other | Admitting: *Deleted

## 2014-06-02 DIAGNOSIS — I4891 Unspecified atrial fibrillation: Secondary | ICD-10-CM | POA: Diagnosis not present

## 2014-06-02 DIAGNOSIS — I10 Essential (primary) hypertension: Secondary | ICD-10-CM

## 2014-06-02 DIAGNOSIS — I480 Paroxysmal atrial fibrillation: Secondary | ICD-10-CM

## 2014-06-02 LAB — BASIC METABOLIC PANEL
BUN: 25 mg/dL — ABNORMAL HIGH (ref 6–23)
CO2: 23 mEq/L (ref 19–32)
Calcium: 9 mg/dL (ref 8.4–10.5)
Chloride: 104 mEq/L (ref 96–112)
Creatinine, Ser: 1.3 mg/dL (ref 0.4–1.5)
GFR: 57.08 mL/min — ABNORMAL LOW (ref >60.00)
Glucose, Bld: 217 mg/dL — ABNORMAL HIGH (ref 70–99)
Potassium: 4.3 mEq/L (ref 3.5–5.1)
Sodium: 137 mEq/L (ref 135–145)

## 2014-06-02 NOTE — Telephone Encounter (Signed)
Please let patient know that he may not have any symptoms but because he has high blood pressure in the blood vessels in his lungs we need to do the study.  If he has sleep apnea that is untreated this high BP can lead to right sided CHF-

## 2014-06-02 NOTE — Telephone Encounter (Signed)
Dr Radford Pax pt is very confused. I keep trying to explain why we are doing these test and that he has to have these test done, but he does not seem to understand. He keeps calling back and asking what's wrong with his lungs. He once again explained how he does not think he needs the sleep study. Pt walked in after labs today to talk to me about testing again. I am with Dr Acie Fredrickson today and was unable to speak to him , but he refused and said he needs someone to explain what's wrong with his lungs. He wants to talk to Dr Radford Pax so he can understand why all of this testing needs to be done. Winifred Olive and I have attempted to make him understand. Please contact pt. Also if possible let me know once you have to let me know if he will allow this sleep study to be scheduled. To Dr Radford Pax

## 2014-06-02 NOTE — Telephone Encounter (Signed)
Pt is aware and ok to scheduled PFT. CT ango has already been scheduled for tomorrow the 18th. Pt will come in for BMET today prior to study. Pt declines Sleep study he said he does not have any problems with snoring, pauses in his sleep, or going to sleep. He says he does wake up but only because he has to go to the bathroom  To Dr Radford Pax to make aware.

## 2014-06-03 ENCOUNTER — Ambulatory Visit (INDEPENDENT_AMBULATORY_CARE_PROVIDER_SITE_OTHER)
Admission: RE | Admit: 2014-06-03 | Discharge: 2014-06-03 | Disposition: A | Discharge status: Home / Self Care | Payer: Medicare Other | Source: Ambulatory Visit | Attending: Cardiology | Admitting: Cardiology

## 2014-06-03 ENCOUNTER — Encounter: Payer: Self-pay | Admitting: General Surgery

## 2014-06-03 DIAGNOSIS — J810 Acute pulmonary edema: Secondary | ICD-10-CM

## 2014-06-03 DIAGNOSIS — R0602 Shortness of breath: Secondary | ICD-10-CM | POA: Diagnosis not present

## 2014-06-03 DIAGNOSIS — I2789 Other specified pulmonary heart diseases: Secondary | ICD-10-CM

## 2014-06-03 DIAGNOSIS — I272 Pulmonary hypertension, unspecified: Secondary | ICD-10-CM

## 2014-06-03 MED ORDER — IOHEXOL 350 MG/ML SOLN
80.0000 mL | Freq: Once | INTRAVENOUS | Status: AC | PRN
  Administered 2014-06-03: 80 mL via INTRAVENOUS

## 2014-06-03 NOTE — Telephone Encounter (Signed)
Pt agreed to Sleep Study. Will put order in For sleep study to be done at [facility] sleep center.

## 2014-06-03 NOTE — Telephone Encounter (Signed)
Explained to the patient the importance of getting the sleep study to rule out OSA as a cause of pulmonary HTN.  He agrees to proceed with the test. Please set up for split night PSG

## 2014-06-05 ENCOUNTER — Ambulatory Visit (INDEPENDENT_AMBULATORY_CARE_PROVIDER_SITE_OTHER): Payer: Medicare Other | Admitting: Internal Medicine

## 2014-06-05 ENCOUNTER — Telehealth: Payer: Self-pay | Admitting: Cardiology

## 2014-06-05 DIAGNOSIS — E782 Mixed hyperlipidemia: Secondary | ICD-10-CM | POA: Diagnosis not present

## 2014-06-05 DIAGNOSIS — K219 Gastro-esophageal reflux disease without esophagitis: Secondary | ICD-10-CM | POA: Diagnosis not present

## 2014-06-05 DIAGNOSIS — Z1331 Encounter for screening for depression: Secondary | ICD-10-CM | POA: Diagnosis not present

## 2014-06-05 DIAGNOSIS — E1129 Type 2 diabetes mellitus with other diabetic kidney complication: Secondary | ICD-10-CM | POA: Diagnosis not present

## 2014-06-05 DIAGNOSIS — N182 Chronic kidney disease, stage 2 (mild): Secondary | ICD-10-CM | POA: Diagnosis not present

## 2014-06-05 DIAGNOSIS — I272 Pulmonary hypertension, unspecified: Secondary | ICD-10-CM

## 2014-06-05 DIAGNOSIS — E669 Obesity, unspecified: Secondary | ICD-10-CM | POA: Diagnosis not present

## 2014-06-05 DIAGNOSIS — I2789 Other specified pulmonary heart diseases: Secondary | ICD-10-CM | POA: Diagnosis not present

## 2014-06-05 DIAGNOSIS — Z6841 Body Mass Index (BMI) 40.0 and over, adult: Secondary | ICD-10-CM | POA: Diagnosis not present

## 2014-06-05 DIAGNOSIS — I10 Essential (primary) hypertension: Secondary | ICD-10-CM | POA: Diagnosis not present

## 2014-06-05 DIAGNOSIS — J810 Acute pulmonary edema: Secondary | ICD-10-CM

## 2014-06-05 LAB — PULMONARY FUNCTION TEST
DL/VA % pred: 107 %
DL/VA: 4.68 ml/min/mmHg/L
DLCO unc % pred: 51 %
DLCO unc: 14.26 ml/min/mmHg
FEF 25-75 Post: 3.2 L/sec
FEF 25-75 Pre: 2.62 L/sec
FEF2575-%Change-Post: 21 %
FEF2575-%Pred-Post: 188 %
FEF2575-%Pred-Pre: 154 %
FEV1-%Change-Post: 3 %
FEV1-%Pred-Post: 80 %
FEV1-%Pred-Pre: 77 %
FEV1-Post: 1.99 L
FEV1-Pre: 1.92 L
FEV1FVC-%Change-Post: 2 %
FEV1FVC-%Pred-Pre: 121 %
FEV6-%Change-Post: 1 %
FEV6-%Pred-Post: 68 %
FEV6-%Pred-Pre: 67 %
FEV6-Post: 2.22 L
FEV6-Pre: 2.19 L
FEV6FVC-%Pred-Post: 107 %
FEV6FVC-%Pred-Pre: 107 %
FVC-%Change-Post: 1 %
FVC-%Pred-Post: 63 %
FVC-%Pred-Pre: 62 %
FVC-Post: 2.22 L
FVC-Pre: 2.19 L
Post FEV1/FVC ratio: 90 %
Post FEV6/FVC ratio: 100 %
Pre FEV1/FVC ratio: 87 %
Pre FEV6/FVC Ratio: 100 %
RV % pred: 58 %
RV: 1.43 L
TLC % pred: 55 %
TLC: 3.49 L

## 2014-06-05 NOTE — Telephone Encounter (Signed)
Please let patient know that his stress test was abnormal showing an Intermediate risk stress nuclear study with medium size mild severity ischemia in the LAD territory and a small scar with periinfarct ischemia in the PDA territory (total SDS 6). LV Ejection Fraction: 48%. LV Wall Motion: Mild global hypokinesis. Given evidence of coronary artery calcifications on chest CT I recommend that we proceed with cardiac catheterization to further evaluate coronary anatomy.  Please set him up for heart cath with me when I am Reader A.  He will need to be seen by PA prior to cath for H&P and labs

## 2014-06-05 NOTE — Progress Notes (Signed)
PFT done today. 

## 2014-06-06 ENCOUNTER — Telehealth: Payer: Self-pay | Admitting: Cardiology

## 2014-06-06 ENCOUNTER — Other Ambulatory Visit: Payer: Self-pay | Admitting: General Surgery

## 2014-06-06 DIAGNOSIS — R942 Abnormal results of pulmonary function studies: Secondary | ICD-10-CM

## 2014-06-06 NOTE — Telephone Encounter (Signed)
Also is pt aware of what a Heart cath would be? Or would I be explaining to the pt why we would be doing the cath?

## 2014-06-06 NOTE — Telephone Encounter (Signed)
New message ° ° ° ° ° °Returning a nurses call °

## 2014-06-06 NOTE — Telephone Encounter (Signed)
Dr Radford Pax I looked thru the schedule with the NP and PAs. No one has availability until 9/1 is it ok to wait until September to cath?

## 2014-06-06 NOTE — Telephone Encounter (Signed)
Spoke with pt and gave results of CT and PFT

## 2014-06-10 NOTE — Telephone Encounter (Signed)
You have a opening on 9/1 do you want me to have him come back in to discuss with you the cath and also set up cath then?

## 2014-06-16 NOTE — Telephone Encounter (Signed)
Pt set up to see [place] on 9/2... Will call pt to make aware of appt.

## 2014-06-16 NOTE — Telephone Encounter (Signed)
Pt is aware and agreed to appt time.

## 2014-06-16 NOTE — Telephone Encounter (Signed)
yes

## 2014-06-17 ENCOUNTER — Ambulatory Visit (INDEPENDENT_AMBULATORY_CARE_PROVIDER_SITE_OTHER): Payer: Medicare Other | Admitting: Internal Medicine

## 2014-06-17 ENCOUNTER — Encounter: Payer: Self-pay | Admitting: Internal Medicine

## 2014-06-17 ENCOUNTER — Other Ambulatory Visit (INDEPENDENT_AMBULATORY_CARE_PROVIDER_SITE_OTHER): Payer: Medicare Other

## 2014-06-17 VITALS — BP 120/70 | HR 71 | Temp 97.0°F | Ht 66.0 in | Wt 263.0 lb

## 2014-06-17 DIAGNOSIS — I2510 Atherosclerotic heart disease of native coronary artery without angina pectoris: Secondary | ICD-10-CM

## 2014-06-17 DIAGNOSIS — I272 Pulmonary hypertension, unspecified: Secondary | ICD-10-CM

## 2014-06-17 DIAGNOSIS — R0602 Shortness of breath: Secondary | ICD-10-CM | POA: Diagnosis not present

## 2014-06-17 DIAGNOSIS — I2789 Other specified pulmonary heart diseases: Secondary | ICD-10-CM | POA: Diagnosis not present

## 2014-06-17 DIAGNOSIS — I4891 Unspecified atrial fibrillation: Secondary | ICD-10-CM | POA: Diagnosis not present

## 2014-06-17 DIAGNOSIS — I482 Chronic atrial fibrillation, unspecified: Secondary | ICD-10-CM

## 2014-06-17 HISTORY — DX: Atherosclerotic heart disease of native coronary artery without angina pectoris: I25.10

## 2014-06-17 LAB — CBC WITH DIFFERENTIAL/PLATELET
Basophils Absolute: 0 10*3/uL (ref 0.0–0.1)
Basophils Relative: 0.5 % (ref 0.0–3.0)
Eosinophils Absolute: 0.2 10*3/uL (ref 0.0–0.7)
Eosinophils Relative: 1.9 % (ref 0.0–5.0)
HCT: 38.1 % — ABNORMAL LOW (ref 39.0–52.0)
Hemoglobin: 12.7 g/dL — ABNORMAL LOW (ref 13.0–17.0)
Lymphocytes Relative: 15.6 % (ref 12.0–46.0)
Lymphs Abs: 1.4 10*3/uL (ref 0.7–4.0)
MCHC: 33.3 g/dL (ref 30.0–36.0)
MCV: 87.4 fl (ref 78.0–100.0)
Monocytes Absolute: 0.7 10*3/uL (ref 0.1–1.0)
Monocytes Relative: 7.4 % (ref 3.0–12.0)
Neutro Abs: 6.9 10*3/uL (ref 1.4–7.7)
Neutrophils Relative %: 74.6 % (ref 43.0–77.0)
Platelets: 284 10*3/uL (ref 150.0–400.0)
RBC: 4.36 Mil/uL (ref 4.22–5.81)
RDW: 14.3 % (ref 11.5–15.5)
WBC: 9.3 10*3/uL (ref 4.0–10.5)

## 2014-06-17 LAB — BASIC METABOLIC PANEL
BUN: 26 mg/dL — ABNORMAL HIGH (ref 6–23)
CO2: 26 mEq/L (ref 19–32)
Calcium: 9.2 mg/dL (ref 8.4–10.5)
Chloride: 103 mEq/L (ref 96–112)
Creatinine, Ser: 1.4 mg/dL (ref 0.4–1.5)
GFR: 51.09 mL/min — ABNORMAL LOW (ref >60.00)
Glucose, Bld: 199 mg/dL — ABNORMAL HIGH (ref 70–99)
Potassium: 4.7 mEq/L (ref 3.5–5.1)
Sodium: 135 mEq/L (ref 135–145)

## 2014-06-17 LAB — BRAIN NATRIURETIC PEPTIDE: Pro B Natriuretic peptide (BNP): 315 pg/mL — ABNORMAL HIGH (ref 0.0–100.0)

## 2014-06-17 LAB — TSH: TSH: 1.63 u[IU]/mL (ref 0.35–4.50)

## 2014-06-17 MED ORDER — PANTOPRAZOLE SODIUM 40 MG PO TBEC: 40.0000 mg | DELAYED_RELEASE_TABLET | Freq: Every day | ORAL | Status: DC

## 2014-06-17 NOTE — Assessment & Plan Note (Addendum)
-   pfts 05/2014 no airflow obst/ mild restriction, with insp truncation  - 06/17/2014   Walked RA x one lap @ 185 stopped due to sob s desat    Symptoms are markedly disproportionate to objective findings and not clear this is a lung problem but pt does appear to have difficult airway management issues. DDX of  difficult airways management all start with A and  include Adherence, Ace Inhibitors, Acid Reflux, Active Sinus Disease, Alpha 1 Antitripsin deficiency, Anxiety masquerading as Airways dz,  ABPA,  allergy(esp in young), Aspiration (esp in elderly), Adverse effects of DPI,  Active smokers, plus two Bs  = Bronchiectasis and Beta blocker use..and one C= CHF  Adherence is always the initial "prime suspect" and is a multilayered concern that requires a "trust but verify" approach in every patient - starting with knowing how to use medications, especially inhalers, correctly, keeping up with refills and understanding the fundamental difference between maintenance and prns vs those medications only taken for a very short course and then stopped and not refilled.   ? Acid (or non-acid) GERD > always difficult to exclude as up to 75% of pts in some series report no assoc GI/ Heartburn symptoms> rec max (24h)  acid suppression and diet restrictions/ reviewed and instructions given in writing.   ? chf > bnp intermediate/ f/u by Dr Radford Pax planned   ? Anxiety/ obesity/ deconditioning dx of exclusion, may need cpst to complete the w/u

## 2014-06-17 NOTE — Patient Instructions (Addendum)
Please see patient coordinator before you leave today  to schedule overnight oxygen test   Pantoprazole (protonix) 40 mg   Take 30-60 min before first meal of the day and ranitidine 150 mg  one bedtime until return to office - this is the best way to tell whether stomach acid is contributing to your problem.    GERD (REFLUX)  is an extremely common cause of respiratory symptoms, many times with no significant heartburn at all.    It can be treated with medication, but also with lifestyle changes including avoidance of late meals, excessive alcohol, smoking cessation, and avoid fatty foods, chocolate, peppermint, colas, red wine, and acidic juices such as orange juice.  NO MINT OR MENTHOL PRODUCTS SO NO COUGH DROPS  USE SUGARLESS CANDY INSTEAD (jolley ranchers or Stover's)  NO OIL BASED VITAMINS - use powdered substitutes.    Please remember to go to the lab  department downstairs for your tests - we will call you with the results when they are available.  Please schedule a follow up office visit in 4 weeks, sooner if needed

## 2014-06-17 NOTE — Progress Notes (Signed)
Quick Note:  LMTCB ______ 

## 2014-06-17 NOTE — Progress Notes (Signed)
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 Follow-up    SOB cough nonproductive  no real variability in ex tol :  walks downhill to mailbox but walking back moderately uphill has to stop about half way, does not remember last time could do this s stopping but it was probably years ago. Walking in grocery leaning on cart doe x one aisle  Cough is variable so  does not correlate with breathing issues, not productive or worse in ams or related to eating   No   Assoc  cp or chest tightness, subjective wheeze overt sinus or hb symptoms. No unusual exp hx or h/o childhood pna/ asthma or knowledge of premature birth.  Sleeping ok without nocturnal  or early am exacerbation  of respiratory  c/o's or need for noct saba. Also denies any obvious fluctuation of symptoms with weather or environmental changes or other aggravating or alleviating factors except as outlined above   Current Medications, Allergies, Complete Past Medical History, Past Surgical History, Family History, and Social History were reviewed in [employer] record.  ROS  The following are not active complaints unless bolded sore throat, dysphagia, dental problems, itching, sneezing,  nasal congestion or excess/ purulent secretions, ear ache,   fever, chills, sweats, unintended wt loss, pleuritic or exertional cp, hemoptysis,  orthopnea pnd or leg swelling, presyncope, palpitations, heartburn, abdominal pain, anorexia, nausea, vomiting, diarrhea  or change in bowel or urinary habits, change in stools or urine, dysuria,hematuria,  rash, arthralgias, visual complaints, headache,  numbness weakness or ataxia or problems with walking or coordination,  change in mood/affect or memory.         Review of Systems     Objective:   Physical Exam    obese amb wm nad   Wt Readings from Last 3 Encounters:  06/17/14 263 lb (119.296 kg)  05/28/14 258 lb (117.028 kg)  05/15/14 262 lb (118.842 kg)      HEENT: nl dentition, turbinates, and orophanx. Nl external ear canals without cough reflex   NECK :  without JVD/Nodes/TM/ nl carotid upstrokes bilaterally   LUNGS: no acc muscle use, clear to A and P bilaterally without cough on insp or exp maneuvers   CV:  RRR  no s3 or murmur or increase in P2, no edema   ABD:  soft and nontender with nl excursion in the supine position. No bruits or organomegaly, bowel sounds nl  MS:  warm without deformities, calf tenderness, cyanosis or clubbing  SKIN: warm and dry without lesions    NEURO:  alert, approp, no deficits    CTa 06/03/14 1. No evidence of pulmonary embolism. Mild degradation secondary to  respiratory motion.  2. Pulmonary artery enlargement suggests pulmonary arterial  hypertension.  3. Cardiomegaly with lipomatous hypertrophy of the interatrial  septum. Atherosclerosis, including within the coronary arteries.  4. Upper normal size thoracic nodes are similar and favored to be  reactive.    Recent Labs Lab 06/17/14 1237  NA 135  K 4.7  CL 103  CO2 26  BUN 26*  CREATININE 1.4  GLUCOSE  199*    Recent Labs Lab 06/17/14 1237  HGB 12.7*  HCT 38.1*  WBC 9.3  PLT 284.0      Lab Results  Component Value Date   PROBNP 315.0* 06/17/2014     Lab Results  Component Value Date   TSH 1.63 06/17/2014      Assessment:

## 2014-06-18 ENCOUNTER — Ambulatory Visit (INDEPENDENT_AMBULATORY_CARE_PROVIDER_SITE_OTHER): Payer: Medicare Other | Admitting: Cardiology

## 2014-06-18 ENCOUNTER — Telehealth: Payer: Self-pay | Admitting: Internal Medicine

## 2014-06-18 ENCOUNTER — Encounter: Payer: Self-pay | Admitting: General Surgery

## 2014-06-18 ENCOUNTER — Other Ambulatory Visit: Payer: Self-pay | Admitting: Cardiology

## 2014-06-18 VITALS — BP 122/68 | HR 70 | Ht 70.0 in | Wt 259.0 lb

## 2014-06-18 DIAGNOSIS — I10 Essential (primary) hypertension: Secondary | ICD-10-CM | POA: Diagnosis not present

## 2014-06-18 DIAGNOSIS — I4891 Unspecified atrial fibrillation: Secondary | ICD-10-CM | POA: Diagnosis not present

## 2014-06-18 DIAGNOSIS — I480 Paroxysmal atrial fibrillation: Secondary | ICD-10-CM

## 2014-06-18 DIAGNOSIS — I272 Pulmonary hypertension, unspecified: Secondary | ICD-10-CM

## 2014-06-18 DIAGNOSIS — R0602 Shortness of breath: Secondary | ICD-10-CM | POA: Diagnosis not present

## 2014-06-18 DIAGNOSIS — I2510 Atherosclerotic heart disease of native coronary artery without angina pectoris: Secondary | ICD-10-CM | POA: Diagnosis not present

## 2014-06-18 DIAGNOSIS — I2789 Other specified pulmonary heart diseases: Secondary | ICD-10-CM

## 2014-06-18 LAB — CBC WITH DIFFERENTIAL/PLATELET
Basophils Absolute: 0 10*3/uL (ref 0.0–0.1)
Basophils Relative: 0.2 % (ref 0.0–3.0)
Eosinophils Absolute: 0.2 10*3/uL (ref 0.0–0.7)
Eosinophils Relative: 2.7 % (ref 0.0–5.0)
HCT: 38.5 % — ABNORMAL LOW (ref 39.0–52.0)
Hemoglobin: 12.7 g/dL — ABNORMAL LOW (ref 13.0–17.0)
Lymphocytes Relative: 15.5 % (ref 12.0–46.0)
Lymphs Abs: 1.3 10*3/uL (ref 0.7–4.0)
MCHC: 33 g/dL (ref 30.0–36.0)
MCV: 87.8 fl (ref 78.0–100.0)
Monocytes Absolute: 0.7 10*3/uL (ref 0.1–1.0)
Monocytes Relative: 8.3 % (ref 3.0–12.0)
Neutro Abs: 6.2 10*3/uL (ref 1.4–7.7)
Neutrophils Relative %: 73.3 % (ref 43.0–77.0)
Platelets: 279 10*3/uL (ref 150.0–400.0)
RBC: 4.38 Mil/uL (ref 4.22–5.81)
RDW: 14.4 % (ref 11.5–15.5)
WBC: 8.4 10*3/uL (ref 4.0–10.5)

## 2014-06-18 LAB — BASIC METABOLIC PANEL
BUN: 27 mg/dL — ABNORMAL HIGH (ref 6–23)
CO2: 25 mEq/L (ref 19–32)
Calcium: 9.4 mg/dL (ref 8.4–10.5)
Chloride: 102 mEq/L (ref 96–112)
Creatinine, Ser: 1.5 mg/dL (ref 0.4–1.5)
GFR: 49.47 mL/min — ABNORMAL LOW (ref >60.00)
Glucose, Bld: 158 mg/dL — ABNORMAL HIGH (ref 70–99)
Potassium: 4.8 mEq/L (ref 3.5–5.1)
Sodium: 134 mEq/L — ABNORMAL LOW (ref 135–145)

## 2014-06-18 LAB — PROTIME-INR
INR: 1.1 ratio — ABNORMAL HIGH (ref 0.8–1.0)
Prothrombin Time: 11.7 s (ref 9.6–13.1)

## 2014-06-18 NOTE — Assessment & Plan Note (Addendum)
Echo 05/28/14 - Left ventricle: The cavity size was normal. Wall thickness was increased in a pattern of mild LVH. Systolic function was normal. The estimated ejection fraction was in the range of 50% to 55%. Doppler parameters are consistent with both elevated ventricular end-diastolic filling pressure and elevated left atrial filling pressure. - Mitral valve: Calcified annulus. - Left atrium: The atrium was moderately dilated. - Atrial septum: No defect or patent foramen ovale was identified. - Pulmonary arteries: PA peak pressure: 53 mm Hg   Most likely this is secondary to Left heart pressures > would need RHC before considering rx other than 02 if indicated  06/18/2014 rec ono RA next step

## 2014-06-18 NOTE — Progress Notes (Signed)
[facility] [place], [place], [place]  [address] Phone: [phone] Fax:  [phone]  Date:  06/18/2014   ID:  [name] [name], DOB Jan 18, 1933, MRN [id number]  PCP:  [name], MD  Cardiologist:  [name], MD     History of Present Illness: [name] [name] is a 80 y.o. male with a history of PAF, HTN and chronic SOB. He denies any chest pain, LE edema, dizziness, palpitations or syncope. He has chronic SOB which significantly limits is activities. When I last saw him he expressed that his SOB had gotten worse.  2D echo showed normal LVF and moderate pulmonary HTN with PASP 43mHg.  A sleep study was ordered which is pending.  Chest CT angio showed enlarged pulmonary arteries and atherosclerosis of the coronary arteries.  Nuclear stress test showed an intermediate risk stress nuclear study with medium size mild severity ischemia in the LAD territory and a small scar with periinfarct ischemia in the PDA territory (total SDS 6). . LV Ejection Fraction: 48%. He is now here for followup of his results.    Wt Readings from Last 3 Encounters:  06/18/14 259 lb (117.482 kg)  06/17/14 263 lb (119.296 kg)  05/28/14 258 lb (117.028 kg)     Past Medical History  Diagnosis Date  . Diabetes   . Obesity   . CAD (coronary artery disease)   . Ulcerative colitis   . HTN (hypertension)   . Dyslipidemia   . GERD (gastroesophageal reflux disease)   . History of peptic ulcer disease   . Ulcerative colitis     s/p total colectomy  . Anemia   . Macular degeneration   . Kidney stone may 2009    Right hydronephrosis, S/P stone removal  . Small bowel obstruction, partial 2009    Episode  . Shingles     episode  . Compression fracture of L1 lumbar vertebra   . Osteopenia   . Lower back pain   . OA (osteoarthritis) of hip   . CKD (chronic kidney disease), stage III   . Hx of acute renal failure 01/02/10-3/24-11    due to hypovolemic shock, gastroenteritis and dehydration,hospitalized . Did  requre a few days of dialysis. Cr at discharge was 1.8  . A-fib     s/p ablation, NSR  . Asymptomatic PVCs     Current Outpatient Prescriptions  Medication Sig Dispense Refill  . amLODipine (NORVASC) 5 MG tablet Take 1 tablet (5 mg total) by mouth daily.  90 tablet  3  . aspirin 81 MG tablet Take 81 mg by mouth daily.      . calcium carbonate (OS-CAL) 600 MG TABS tablet Take 600 mg by mouth daily with breakfast.      . glyBURIDE-metformin (GLUCOVANCE) 5-500 MG per tablet       . metoprolol (LOPRESSOR) 50 MG tablet Take 1.5 tablets (75 mg total) by mouth 2 (two) times daily.  90 tablet  6  . ONGLYZA 5 MG TABS tablet       . pantoprazole (PROTONIX) 40 MG tablet Take 1 tablet (40 mg total) by mouth daily before breakfast.  30 tablet  5  . pioglitazone (ACTOS) 30 MG tablet Take 30 mg by mouth daily.      . simvastatin (ZOCOR) 20 MG tablet Take 20 mg by mouth daily.      . sitaGLIPtin (JANUVIA) 100 MG tablet Take 100 mg by mouth daily.       No current facility-administered medications  for this visit.    Allergies:    Allergies  Allergen Reactions  . Penicillins Rash  . Sulfa Antibiotics Rash    Social History:  The patient  reports that he has never smoked. He does not have any smokeless tobacco history on file. He reports that he does not drink alcohol or use illicit drugs.   Family History:  The patient's family history includes Colon cancer in his mother.   ROS:  Please see the history of present illness.      All other systems reviewed and negative.   PHYSICAL EXAM: VS:  BP 122/68  Pulse 70  Ht 5' 10"  (1.778 m)  Wt 259 lb (117.482 kg)  BMI 37.16 kg/m2 Well nourished, well developed, in no acute distress HEENT: normal Neck: no JVD Cardiac:  normal S1, S2; RRR; no murmur Lungs:  clear to auscultation bilaterally, no wheezing, rhonchi or rales Abd: soft, nontender, no hepatomegaly Ext: no edema Skin: warm and dry Neuro:  CNs 2-12 intact, no focal abnormalities  noted  EKG:  NSR with nonspecific IVCD and PVC's, LVH     ASSESSMENT AND PLAN:  1. PAF maintaining NSR s/p ablation - continue ASA/metoprolol  3. HTN - controlled - continue Amlodipine/metoprolol  4. Obesity       5.  Chronic SOB - this is limiting his daily activities.  2D echo showed moderate pulmonary HTN PASP 47mHg with normal LVF.  Chest CT did not show any evidence of chronic PE's but did show atherosclerosis of the coronary arteries.  Nuclear stress test showed ischemia in the LAD and PDA territories.  He is set up for sleep study to rule out OSA as etiology of pulmonary HTN.  Given his abnormal nuclear stress test and coronary artery calcifications on chest CT I have recommended that we proceed with right and left heart catheterization to assess PA pressures and evaluate for obstructive CAD. Cardiac catheterization was discussed with the patient fully including risks on myocardial infarction, death, stroke, bleeding, arrhythmia, dye allergy, renal insufficiency or bleeding.  All patient questions and concerns were discussed and the patient understands and is willing to proceed.      Signed, TFransico Him MD 06/18/2014 8:27 AM

## 2014-06-18 NOTE — Telephone Encounter (Signed)
Spoke with the pt and notified him of lab results  Pt verbalized understanding  Nothing further needed

## 2014-06-18 NOTE — Progress Notes (Signed)
Quick Note:  Spoke with pt and notified of results per Dr. Wert. Pt verbalized understanding and denied any questions.  ______ 

## 2014-06-18 NOTE — Assessment & Plan Note (Signed)
Adequate control on present rx, reviewed > f/u cards

## 2014-06-18 NOTE — H&P (Signed)
[place], [place], [place]  [address] Phone: [phone] Fax:  [phone]  Date:  06/18/2014   ID:  [name] [name], DOB [birth year], MRN [id number]  PCP:  [name], MD  Cardiologist:  [name], MD     History of Present Illness: [name] [name] is a 80 y.o. male with a history of PAF, HTN and chronic SOB. He denies any chest pain, LE edema, dizziness, palpitations or syncope. He has chronic SOB which significantly limits is activities. When I last saw him he expressed that his SOB had gotten worse.  2D echo showed normal LVF and moderate pulmonary HTN with PASP 58mHg.  A sleep study was ordered which is pending.  Chest CT angio showed enlarged pulmonary arteries and atherosclerosis of the coronary arteries.  Nuclear stress test showed an intermediate risk stress nuclear study with medium size mild severity ischemia in the LAD territory and a small scar with periinfarct ischemia in the PDA territory (total SDS 6). . LV Ejection Fraction: 48%. He is now here for followup of his results.    Wt Readings from Last 3 Encounters:  06/18/14 259 lb (117.482 kg)  06/17/14 263 lb (119.296 kg)  05/28/14 258 lb (117.028 kg)     Past Medical History  Diagnosis Date  . Diabetes   . Obesity   . CAD (coronary artery disease)   . Ulcerative colitis   . HTN (hypertension)   . Dyslipidemia   . GERD (gastroesophageal reflux disease)   . History of peptic ulcer disease   . Ulcerative colitis     s/p total colectomy  . Anemia   . Macular degeneration   . Kidney stone may 2009    Right hydronephrosis, S/P stone removal  . Small bowel obstruction, partial 2009    Episode  . Shingles     episode  . Compression fracture of L1 lumbar vertebra   . Osteopenia   . Lower back pain   . OA (osteoarthritis) of hip   . CKD (chronic kidney disease), stage III   . Hx of acute renal failure 01/02/10-3/24-11    due to hypovolemic shock, gastroenteritis and dehydration,hospitalized . Did  requre a few days of dialysis. Cr at discharge was 1.8  . A-fib     s/p ablation, NSR  . Asymptomatic PVCs     Current Outpatient Prescriptions  Medication Sig Dispense Refill  . amLODipine (NORVASC) 5 MG tablet Take 1 tablet (5 mg total) by mouth daily.  90 tablet  3  . aspirin 81 MG tablet Take 81 mg by mouth daily.      . calcium carbonate (OS-CAL) 600 MG TABS tablet Take 600 mg by mouth daily with breakfast.      . glyBURIDE-metformin (GLUCOVANCE) 5-500 MG per tablet       . metoprolol (LOPRESSOR) 50 MG tablet Take 1.5 tablets (75 mg total) by mouth 2 (two) times daily.  90 tablet  6  . ONGLYZA 5 MG TABS tablet       . pantoprazole (PROTONIX) 40 MG tablet Take 1 tablet (40 mg total) by mouth daily before breakfast.  30 tablet  5  . pioglitazone (ACTOS) 30 MG tablet Take 30 mg by mouth daily.      . simvastatin (ZOCOR) 20 MG tablet Take 20 mg by mouth daily.      . sitaGLIPtin (JANUVIA) 100 MG tablet Take 100 mg by mouth daily.       No current facility-administered medications  for this visit.    Allergies:    Allergies  Allergen Reactions  . Penicillins Rash  . Sulfa Antibiotics Rash    Social History:  The patient  reports that he has never smoked. He does not have any smokeless tobacco history on file. He reports that he does not drink alcohol or use illicit drugs.   Family History:  The patient's family history includes Colon cancer in his mother.   ROS:  Please see the history of present illness.      All other systems reviewed and negative.   PHYSICAL EXAM: VS:  BP 122/68  Pulse 70  Ht 5' 10"  (1.778 m)  Wt 259 lb (117.482 kg)  BMI 37.16 kg/m2 Well nourished, well developed, in no acute distress HEENT: normal Neck: no JVD Cardiac:  normal S1, S2; RRR; no murmur Lungs:  clear to auscultation bilaterally, no wheezing, rhonchi or rales Abd: soft, nontender, no hepatomegaly Ext: no edema Skin: warm and dry Neuro:  CNs 2-12 intact, no focal abnormalities  noted  EKG:  NSR with nonspecific IVCD and PVC's, LVH     ASSESSMENT AND PLAN:  1. PAF maintaining NSR s/p ablation - continue ASA/metoprolol  3. HTN - controlled - continue Amlodipine/metoprolol  4. Obesity       5.  Chronic SOB - this is limiting his daily activities.  2D echo showed moderate pulmonary HTN PASP 23mHg with normal LVF.  Chest CT did not show any evidence of chronic PE's but did show atherosclerosis of the coronary arteries.  Nuclear stress test showed ischemia in the LAD and PDA territories.  He is set up for sleep study to rule out OSA as etiology of pulmonary HTN.  Given his abnormal nuclear stress test and coronary artery calcifications on chest CT I have recommended that we proceed with right and left heart catheterization to assess PA pressures and evaluate for obstructive CAD. Cardiac catheterization was discussed with the patient fully including risks on myocardial infarction, death, stroke, bleeding, arrhythmia, dye allergy, renal insufficiency or bleeding.  All patient questions and concerns were discussed and the patient understands and is willing to proceed.      Signed, TFransico Him MD 06/18/2014 8:27 AM

## 2014-06-18 NOTE — Patient Instructions (Addendum)
Your physician recommends that you continue on your current medications as directed. Please refer to the Current Medication list given to you today.  Your physician recommends that you go to the lab today for a BMET, PT/INR, and CBC w/diff  Your physician has requested that you have a cardiac catheterization. Cardiac catheterization is used to diagnose and/or treat various heart conditions. Doctors may recommend this procedure for a number of different reasons. The most common reason is to evaluate chest pain. Chest pain can be a symptom of coronary artery disease (CAD), and cardiac catheterization can show whether plaque is narrowing or blocking your heart's arteries. This procedure is also used to evaluate the valves, as well as measure the blood flow and oxygen levels in different parts of your heart. For further information please visit [email or web address]. Please follow instruction sheet, as given. (Scheduled with Dr Martinique on 06/19/14 at 12:00)  Your physician wants you to follow-up in: 6 months with Dr Mallie Snooks will receive a reminder letter in the mail two months in advance. If you don't receive a letter, please call our office to schedule the follow-up appointment.

## 2014-06-19 ENCOUNTER — Encounter (HOSPITAL_COMMUNITY)
Admission: RE | Disposition: A | Discharge status: Home / Self Care | Payer: Self-pay | Source: Ambulatory Visit | Attending: Cardiology

## 2014-06-19 ENCOUNTER — Encounter (HOSPITAL_COMMUNITY): Payer: Self-pay | Admitting: General Practice

## 2014-06-19 ENCOUNTER — Ambulatory Visit (HOSPITAL_COMMUNITY)
Admission: RE | Admit: 2014-06-19 | Discharge: 2014-06-20 | Disposition: A | Discharge status: Home / Self Care | Payer: Medicare Other | Source: Ambulatory Visit | Attending: Cardiology | Admitting: Cardiology

## 2014-06-19 DIAGNOSIS — I129 Hypertensive chronic kidney disease with stage 1 through stage 4 chronic kidney disease, or unspecified chronic kidney disease: Secondary | ICD-10-CM | POA: Insufficient documentation

## 2014-06-19 DIAGNOSIS — I2510 Atherosclerotic heart disease of native coronary artery without angina pectoris: Secondary | ICD-10-CM | POA: Diagnosis not present

## 2014-06-19 DIAGNOSIS — I2789 Other specified pulmonary heart diseases: Secondary | ICD-10-CM | POA: Diagnosis not present

## 2014-06-19 DIAGNOSIS — E785 Hyperlipidemia, unspecified: Secondary | ICD-10-CM | POA: Diagnosis not present

## 2014-06-19 DIAGNOSIS — N183 Chronic kidney disease, stage 3 unspecified: Secondary | ICD-10-CM | POA: Diagnosis not present

## 2014-06-19 DIAGNOSIS — E669 Obesity, unspecified: Secondary | ICD-10-CM | POA: Diagnosis not present

## 2014-06-19 DIAGNOSIS — I2582 Chronic total occlusion of coronary artery: Secondary | ICD-10-CM | POA: Insufficient documentation

## 2014-06-19 DIAGNOSIS — I209 Angina pectoris, unspecified: Secondary | ICD-10-CM | POA: Insufficient documentation

## 2014-06-19 DIAGNOSIS — M169 Osteoarthritis of hip, unspecified: Secondary | ICD-10-CM | POA: Insufficient documentation

## 2014-06-19 DIAGNOSIS — K219 Gastro-esophageal reflux disease without esophagitis: Secondary | ICD-10-CM | POA: Diagnosis not present

## 2014-06-19 DIAGNOSIS — E119 Type 2 diabetes mellitus without complications: Secondary | ICD-10-CM

## 2014-06-19 DIAGNOSIS — I480 Paroxysmal atrial fibrillation: Secondary | ICD-10-CM

## 2014-06-19 DIAGNOSIS — M1610 Unilateral primary osteoarthritis, unspecified hip: Secondary | ICD-10-CM | POA: Insufficient documentation

## 2014-06-19 DIAGNOSIS — I4891 Unspecified atrial fibrillation: Secondary | ICD-10-CM | POA: Insufficient documentation

## 2014-06-19 DIAGNOSIS — I10 Essential (primary) hypertension: Secondary | ICD-10-CM

## 2014-06-19 DIAGNOSIS — Z6837 Body mass index (BMI) 37.0-37.9, adult: Secondary | ICD-10-CM | POA: Diagnosis not present

## 2014-06-19 DIAGNOSIS — I272 Pulmonary hypertension, unspecified: Secondary | ICD-10-CM

## 2014-06-19 DIAGNOSIS — E1159 Type 2 diabetes mellitus with other circulatory complications: Secondary | ICD-10-CM

## 2014-06-19 DIAGNOSIS — R0602 Shortness of breath: Secondary | ICD-10-CM

## 2014-06-19 DIAGNOSIS — Z955 Presence of coronary angioplasty implant and graft: Secondary | ICD-10-CM

## 2014-06-19 HISTORY — DX: Adverse effect of unspecified anesthetic, initial encounter: T41.45XA

## 2014-06-19 HISTORY — PX: CARDIAC CATHETERIZATION: SHX172

## 2014-06-19 HISTORY — PX: CORONARY STENT PLACEMENT: SHX1402

## 2014-06-19 HISTORY — DX: Other complications of anesthesia, initial encounter: T88.59XA

## 2014-06-19 HISTORY — PX: LEFT AND RIGHT HEART CATHETERIZATION WITH CORONARY ANGIOGRAM: SHX5449

## 2014-06-19 LAB — POCT I-STAT 3, VENOUS BLOOD GAS (G3P V)
Acid-base deficit: 3 mmol/L — ABNORMAL HIGH (ref 0.0–2.0)
Bicarbonate: 21.4 mEq/L (ref 20.0–24.0)
O2 Saturation: 69 %
TCO2: 23 mmol/L (ref 0–100)
pCO2, Ven: 36.5 mmHg — ABNORMAL LOW (ref 45.0–50.0)
pH, Ven: 7.376 — ABNORMAL HIGH (ref 7.250–7.300)
pO2, Ven: 36 mmHg (ref 30.0–45.0)

## 2014-06-19 LAB — POCT ACTIVATED CLOTTING TIME
Activated Clotting Time: 208 seconds
Activated Clotting Time: 264 seconds

## 2014-06-19 LAB — POCT I-STAT 3, ART BLOOD GAS (G3+)
Acid-base deficit: 2 mmol/L (ref 0.0–2.0)
Bicarbonate: 22.5 mEq/L (ref 20.0–24.0)
O2 Saturation: 95 %
TCO2: 24 mmol/L (ref 0–100)
pCO2 arterial: 35.9 mmHg (ref 35.0–45.0)
pH, Arterial: 7.404 (ref 7.350–7.450)
pO2, Arterial: 75 mmHg — ABNORMAL LOW (ref 80.0–100.0)

## 2014-06-19 LAB — GLUCOSE, CAPILLARY
Glucose-Capillary: 150 mg/dL — ABNORMAL HIGH (ref 70–99)
Glucose-Capillary: 92 mg/dL (ref 70–99)
Glucose-Capillary: 263 mg/dL — ABNORMAL HIGH (ref 70–99)

## 2014-06-19 LAB — MRSA PCR SCREENING: MRSA by PCR: NEGATIVE

## 2014-06-19 SURGERY — LEFT AND RIGHT HEART CATHETERIZATION WITH CORONARY ANGIOGRAM
Anesthesia: LOCAL

## 2014-06-19 MED ORDER — LINAGLIPTIN 5 MG PO TABS
5.0000 mg | ORAL_TABLET | Freq: Every day | ORAL | Status: DC
  Administered 2014-06-19 – 2014-06-20 (×2): 5 mg via ORAL
  Filled 2014-06-19 (×2): qty 1

## 2014-06-19 MED ORDER — ASPIRIN 81 MG PO CHEW
CHEWABLE_TABLET | ORAL | Status: AC
  Administered 2014-06-19: 81 mg via ORAL
  Filled 2014-06-19: qty 1

## 2014-06-19 MED ORDER — PANTOPRAZOLE SODIUM 40 MG PO TBEC
40.0000 mg | DELAYED_RELEASE_TABLET | Freq: Every day | ORAL | Status: DC
  Administered 2014-06-20: 40 mg via ORAL
  Filled 2014-06-19: qty 1

## 2014-06-19 MED ORDER — MIDAZOLAM HCL 2 MG/2ML IJ SOLN
INTRAMUSCULAR | Status: AC
  Filled 2014-06-19: qty 2

## 2014-06-19 MED ORDER — FENTANYL CITRATE 0.05 MG/ML IJ SOLN
INTRAMUSCULAR | Status: AC
  Filled 2014-06-19: qty 2

## 2014-06-19 MED ORDER — SODIUM CHLORIDE 0.9 % IV SOLN: 250.0000 mL | INTRAVENOUS | Status: DC | PRN

## 2014-06-19 MED ORDER — SODIUM CHLORIDE 0.9 % IV SOLN
1.0000 mL/kg/h | INTRAVENOUS | Status: AC
  Administered 2014-06-19: 1 mL/kg/h via INTRAVENOUS

## 2014-06-19 MED ORDER — CALCIUM CARBONATE 600 MG PO TABS
600.0000 mg | ORAL_TABLET | Freq: Every day | ORAL | Status: DC
  Administered 2014-06-20: 600 mg via ORAL
  Filled 2014-06-19 (×2): qty 1

## 2014-06-19 MED ORDER — LIDOCAINE HCL (PF) 1 % IJ SOLN
INTRAMUSCULAR | Status: AC
  Filled 2014-06-19: qty 30

## 2014-06-19 MED ORDER — SIMVASTATIN 20 MG PO TABS
20.0000 mg | ORAL_TABLET | Freq: Every day | ORAL | Status: DC
  Filled 2014-06-19 (×2): qty 1

## 2014-06-19 MED ORDER — PIOGLITAZONE HCL 30 MG PO TABS
30.0000 mg | ORAL_TABLET | Freq: Every day | ORAL | Status: DC
  Administered 2014-06-20: 09:00:00 30 mg via ORAL
  Filled 2014-06-19: qty 1

## 2014-06-19 MED ORDER — ONDANSETRON HCL 4 MG/2ML IJ SOLN: 4.0000 mg | Freq: Four times a day (QID) | INTRAMUSCULAR | Status: DC | PRN

## 2014-06-19 MED ORDER — METOPROLOL TARTRATE 50 MG PO TABS
75.0000 mg | ORAL_TABLET | Freq: Two times a day (BID) | ORAL | Status: DC
  Administered 2014-06-19 – 2014-06-20 (×2): 75 mg via ORAL
  Filled 2014-06-19 (×3): qty 1

## 2014-06-19 MED ORDER — HEPARIN (PORCINE) IN NACL 2-0.9 UNIT/ML-% IJ SOLN
INTRAMUSCULAR | Status: AC
  Filled 2014-06-19: qty 1000

## 2014-06-19 MED ORDER — HEPARIN SODIUM (PORCINE) 1000 UNIT/ML IJ SOLN
INTRAMUSCULAR | Status: AC
  Filled 2014-06-19: qty 1

## 2014-06-19 MED ORDER — AMLODIPINE BESYLATE 5 MG PO TABS
5.0000 mg | ORAL_TABLET | Freq: Every day | ORAL | Status: DC
  Administered 2014-06-20: 5 mg via ORAL
  Filled 2014-06-19 (×2): qty 1

## 2014-06-19 MED ORDER — SODIUM CHLORIDE 0.9 % IJ SOLN: 3.0000 mL | INTRAMUSCULAR | Status: DC | PRN

## 2014-06-19 MED ORDER — SODIUM CHLORIDE 0.9 % IJ SOLN: 3.0000 mL | Freq: Two times a day (BID) | INTRAMUSCULAR | Status: DC

## 2014-06-19 MED ORDER — ASPIRIN 81 MG PO CHEW
81.0000 mg | CHEWABLE_TABLET | ORAL | Status: AC
  Administered 2014-06-19: 81 mg via ORAL

## 2014-06-19 MED ORDER — ACETAMINOPHEN 325 MG PO TABS: 650.0000 mg | ORAL_TABLET | ORAL | Status: DC | PRN

## 2014-06-19 MED ORDER — SODIUM CHLORIDE 0.9 % IV SOLN
INTRAVENOUS | Status: DC
  Administered 2014-06-19: 11:00:00 via INTRAVENOUS

## 2014-06-19 MED ORDER — TICAGRELOR 90 MG PO TABS
90.0000 mg | ORAL_TABLET | Freq: Two times a day (BID) | ORAL | Status: DC
  Administered 2014-06-19 – 2014-06-20 (×2): 90 mg via ORAL
  Filled 2014-06-19 (×3): qty 1

## 2014-06-19 MED ORDER — ASPIRIN 81 MG PO CHEW
81.0000 mg | CHEWABLE_TABLET | Freq: Every day | ORAL | Status: DC
  Administered 2014-06-20: 81 mg via ORAL
  Filled 2014-06-19: qty 1

## 2014-06-19 MED ORDER — TICAGRELOR 90 MG PO TABS
ORAL_TABLET | ORAL | Status: AC
  Filled 2014-06-19: qty 2

## 2014-06-19 MED ORDER — VERAPAMIL HCL 2.5 MG/ML IV SOLN
INTRAVENOUS | Status: AC
  Filled 2014-06-19: qty 2

## 2014-06-19 NOTE — Progress Notes (Signed)
Site area:  Right brachial venous sheath  Site Prior to Removal:  Level 0  Pressure Applied For 15 MINUTES    Minutes Beginning at 1700  Manual:   Yes.    Patient Status During Pull:  stable  Post Pull brachail Site:  Level 0 with brusing  Post Pull Instructions Given:  Yes.    Post Pull Pulses Present:  Yes.    Dressing Applied:  Yes.    Comments:  Hemostatis achieved to right brachial site. Pt denies any pain at this site

## 2014-06-19 NOTE — CV Procedure (Signed)
Cardiac Catheterization Procedure Note  Name: [name] [name] MRN: [id number] DOB: 1932/11/11  Procedure: Right Heart Cath, Left Heart Cath, Selective Coronary Angiography, LV angiography, PCI of the ramus intermediate vessel.   Indication: 80 yo WM with progressive dyspnea. Echo showed evidence of pulmonary HTN. Myoview showed ischemia in the LAD and inferior territory.   Procedural Details: The right wrist was prepped, draped, and anesthetized with 1% lidocaine. Using the modified Seldinger technique a 6 Fr slender sheath was placed in the right radial artery and a 5 French sheath was placed in the right brachial vein. A Swan-Ganz catheter was used for the right heart catheterization. Standard protocol was followed for recording of right heart pressures and sampling of oxygen saturations. Fick cardiac output was calculated. Standard Judkins catheters were used for selective coronary angiography and left ventriculography. There were no immediate procedural complications. The patient was transferred to the post catheterization recovery area for further monitoring.  Procedural Findings: Hemodynamics RA 7/6 mean 4 mm Hg RV 41/5 mm Hg PA 41/16 mean 27 mm Hg  PCWP 14/17 mean 11 mm Hg LV 152/11 mm Hg AO 150/66 mean 96 mm Hg  Oxygen saturations: PA 69% AO 95%  Cardiac Output (Fick) 6.9 L/min  Cardiac Index (Fick) 2.9 L/min/meter squared.   Coronary angiography: Coronary dominance: right  Left mainstem: Short, normal.  Left anterior descending (LAD): The LAD is occluded after a septal perforator. There are minimal right to left collaterals  There is a large ramus intermediate vessel that extends to the apex. There is an 80-90% proximal stenosis with a long segment of disease.   Left circumflex (LCx): The LCx gives off 3 OM branches. The first OM is very small in caliber and without obstructive disease. The second OM is small in caliber with 50-70% disease proximally. There third OM  has 30% stenosis.  Right coronary artery (RCA): The RCA is a large dominant vessel. There is a 40% stenosis in the mid vessel.   Left ventriculography: Left ventricular systolic function is normal, LVEF is estimated at 50-55%, there is no significant mitral regurgitation.   At the end of the diagnostic procedure we decided to proceed with PCI of the ramus intermediate vessel. The patient was anticoagulated with IV heparin. Brilinta 180 mg was given orally. Once a therapeutic ACT was obtained a 6 Fr XBLAD 3.5 guide was used to access the left coronary artery. A prowater wire was used to cross the lesion. The lesion was predilated with a 2.5 mm balloon. The lesion was then stented with a 3.0 x 38 mm Promus stent. The stent was post dilated with a 3.25 mm noncompliant balloon. Following PCI, there was 0% residual stenosis and TIMI 3 flow. Angiography confirmed an excellent result. The patient tolerated the procedure well without complications. He was transferred to the post procedure area for further monitoring.  Vessel: Ramus intermediate: proximal Percent stenosis pre- 80-90% TIMI flow pre- 3 Stent : 3.0 x 38 mm Promus Percent stenosis post- 0%. TIMI flow post- 3   Final Conclusions:   1. 2 vessel obstructive CAD. Chronic total occlusion of the LAD with minimal collaterals. Severe disease in a large ramus intermediate. 2. Good LV function 3. Normal right heart and LV filling pressures. 4. Successful stenting of the ramus intermediate with DES.  Recommendations: DAPT for one year. Treat other residual disease medically. He is not a candidate for CTO PCI. Anticipate DC in am.   [name] [name], [name] 06/19/2014, 3:32 PM

## 2014-06-19 NOTE — Interval H&P Note (Signed)
History and Physical Interval Note:  06/19/2014 2:30 PM  [name]  has presented today for surgery, with the diagnosis of shortness of breath  The various methods of treatment have been discussed with the patient and family. After consideration of risks, benefits and other options for treatment, the patient has consented to  Procedure(s): LEFT AND RIGHT HEART CATHETERIZATION WITH CORONARY ANGIOGRAM (N/A) as a surgical intervention .  The patient's history has been reviewed, patient examined, no change in status, stable for surgery.  I have reviewed the patient's chart and labs.  Questions were answered to the patient's satisfaction.     Col[facility] 06/19/2014 2:30 PM Cath Lab Visit (complete for each Cath Lab visit)  Clinical Evaluation Leading to the Procedure:   ACS: No.  Non-ACS:    Anginal Classification: CCS III  Anti-ischemic medical therapy: Maximal Therapy (2 or more classes of medications)  Non-Invasive Test Results: High-risk stress test findings: cardiac mortality >3%/year  Prior CABG: No previous CABG

## 2014-06-19 NOTE — H&P (View-Only) (Signed)
[place], [place], [place]  [address] Phone: [phone] Fax:  [phone]  Date:  06/18/2014   ID:  [name] [name], DOB 03/15/1933, MRN [id number]  PCP:  [name], MD  Cardiologist:  [name], MD     History of Present Illness: [name] [name] is a 80 y.o. male with a history of PAF, HTN and chronic SOB. He denies any chest pain, LE edema, dizziness, palpitations or syncope. He has chronic SOB which significantly limits is activities. When I last saw him he expressed that his SOB had gotten worse.  2D echo showed normal LVF and moderate pulmonary HTN with PASP 53mHg.  A sleep study was ordered which is pending.  Chest CT angio showed enlarged pulmonary arteries and atherosclerosis of the coronary arteries.  Nuclear stress test showed an intermediate risk stress nuclear study with medium size mild severity ischemia in the LAD territory and a small scar with periinfarct ischemia in the PDA territory (total SDS 6). . LV Ejection Fraction: 48%. He is now here for followup of his results.    Wt Readings from Last 3 Encounters:  06/18/14 259 lb (117.482 kg)  06/17/14 263 lb (119.296 kg)  05/28/14 258 lb (117.028 kg)     Past Medical History  Diagnosis Date  . Diabetes   . Obesity   . CAD (coronary artery disease)   . Ulcerative colitis   . HTN (hypertension)   . Dyslipidemia   . GERD (gastroesophageal reflux disease)   . History of peptic ulcer disease   . Ulcerative colitis     s/p total colectomy  . Anemia   . Macular degeneration   . Kidney stone may 2009    Right hydronephrosis, S/P stone removal  . Small bowel obstruction, partial 2009    Episode  . Shingles     episode  . Compression fracture of L1 lumbar vertebra   . Osteopenia   . Lower back pain   . OA (osteoarthritis) of hip   . CKD (chronic kidney disease), stage III   . Hx of acute renal failure 01/02/10-3/24-11    due to hypovolemic shock, gastroenteritis and dehydration,hospitalized . Did  requre a few days of dialysis. Cr at discharge was 1.8  . A-fib     s/p ablation, NSR  . Asymptomatic PVCs     Current Outpatient Prescriptions  Medication Sig Dispense Refill  . amLODipine (NORVASC) 5 MG tablet Take 1 tablet (5 mg total) by mouth daily.  90 tablet  3  . aspirin 81 MG tablet Take 81 mg by mouth daily.      . calcium carbonate (OS-CAL) 600 MG TABS tablet Take 600 mg by mouth daily with breakfast.      . glyBURIDE-metformin (GLUCOVANCE) 5-500 MG per tablet       . metoprolol (LOPRESSOR) 50 MG tablet Take 1.5 tablets (75 mg total) by mouth 2 (two) times daily.  90 tablet  6  . ONGLYZA 5 MG TABS tablet       . pantoprazole (PROTONIX) 40 MG tablet Take 1 tablet (40 mg total) by mouth daily before breakfast.  30 tablet  5  . pioglitazone (ACTOS) 30 MG tablet Take 30 mg by mouth daily.      . simvastatin (ZOCOR) 20 MG tablet Take 20 mg by mouth daily.      . sitaGLIPtin (JANUVIA) 100 MG tablet Take 100 mg by mouth daily.       No current facility-administered medications  for this visit.    Allergies:    Allergies  Allergen Reactions  . Penicillins Rash  . Sulfa Antibiotics Rash    Social History:  The patient  reports that he has never smoked. He does not have any smokeless tobacco history on file. He reports that he does not drink alcohol or use illicit drugs.   Family History:  The patient's family history includes Colon cancer in his mother.   ROS:  Please see the history of present illness.      All other systems reviewed and negative.   PHYSICAL EXAM: VS:  BP 122/68  Pulse 70  Ht 5' 10"  (1.778 m)  Wt 259 lb (117.482 kg)  BMI 37.16 kg/m2 Well nourished, well developed, in no acute distress HEENT: normal Neck: no JVD Cardiac:  normal S1, S2; RRR; no murmur Lungs:  clear to auscultation bilaterally, no wheezing, rhonchi or rales Abd: soft, nontender, no hepatomegaly Ext: no edema Skin: warm and dry Neuro:  CNs 2-12 intact, no focal abnormalities  noted  EKG:  NSR with nonspecific IVCD and PVC's, LVH     ASSESSMENT AND PLAN:  1. PAF maintaining NSR s/p ablation - continue ASA/metoprolol  3. HTN - controlled - continue Amlodipine/metoprolol  4. Obesity       5.  Chronic SOB - this is limiting his daily activities.  2D echo showed moderate pulmonary HTN PASP 71mHg with normal LVF.  Chest CT did not show any evidence of chronic PE's but did show atherosclerosis of the coronary arteries.  Nuclear stress test showed ischemia in the LAD and PDA territories.  He is set up for sleep study to rule out OSA as etiology of pulmonary HTN.  Given his abnormal nuclear stress test and coronary artery calcifications on chest CT I have recommended that we proceed with right and left heart catheterization to assess PA pressures and evaluate for obstructive CAD. Cardiac catheterization was discussed with the patient fully including risks on myocardial infarction, death, stroke, bleeding, arrhythmia, dye allergy, renal insufficiency or bleeding.  All patient questions and concerns were discussed and the patient understands and is willing to proceed.      Signed, TFransico Him MD 06/18/2014 8:27 AM

## 2014-06-20 ENCOUNTER — Other Ambulatory Visit: Payer: Self-pay | Admitting: Internal Medicine

## 2014-06-20 DIAGNOSIS — E669 Obesity, unspecified: Secondary | ICD-10-CM

## 2014-06-20 DIAGNOSIS — I2510 Atherosclerotic heart disease of native coronary artery without angina pectoris: Secondary | ICD-10-CM | POA: Diagnosis not present

## 2014-06-20 DIAGNOSIS — E785 Hyperlipidemia, unspecified: Secondary | ICD-10-CM | POA: Diagnosis not present

## 2014-06-20 DIAGNOSIS — R0602 Shortness of breath: Secondary | ICD-10-CM

## 2014-06-20 DIAGNOSIS — I4891 Unspecified atrial fibrillation: Secondary | ICD-10-CM | POA: Diagnosis not present

## 2014-06-20 DIAGNOSIS — I10 Essential (primary) hypertension: Secondary | ICD-10-CM | POA: Diagnosis not present

## 2014-06-20 DIAGNOSIS — I209 Angina pectoris, unspecified: Secondary | ICD-10-CM

## 2014-06-20 DIAGNOSIS — I2582 Chronic total occlusion of coronary artery: Secondary | ICD-10-CM | POA: Diagnosis not present

## 2014-06-20 DIAGNOSIS — Z9861 Coronary angioplasty status: Secondary | ICD-10-CM

## 2014-06-20 DIAGNOSIS — E1159 Type 2 diabetes mellitus with other circulatory complications: Secondary | ICD-10-CM | POA: Diagnosis not present

## 2014-06-20 DIAGNOSIS — I2789 Other specified pulmonary heart diseases: Secondary | ICD-10-CM

## 2014-06-20 LAB — CBC
HCT: 36.2 % — ABNORMAL LOW (ref 39.0–52.0)
Hemoglobin: 11.9 g/dL — ABNORMAL LOW (ref 13.0–17.0)
MCH: 28.4 pg (ref 26.0–34.0)
MCHC: 32.9 g/dL (ref 30.0–36.0)
MCV: 86.4 fL (ref 78.0–100.0)
Platelets: 241 10*3/uL (ref 150–400)
RBC: 4.19 MIL/uL — ABNORMAL LOW (ref 4.22–5.81)
RDW: 14.4 % (ref 11.5–15.5)
WBC: 7.7 10*3/uL (ref 4.0–10.5)

## 2014-06-20 LAB — BASIC METABOLIC PANEL
Anion gap: 14 (ref 5–15)
BUN: 20 mg/dL (ref 6–23)
CO2: 20 mEq/L (ref 19–32)
Calcium: 8.7 mg/dL (ref 8.4–10.5)
Chloride: 102 mEq/L (ref 96–112)
Creatinine, Ser: 1.07 mg/dL (ref 0.50–1.35)
GFR calc Af Amer: 74 mL/min — ABNORMAL LOW (ref >90)
GFR calc non Af Amer: 64 mL/min — ABNORMAL LOW (ref >90)
Glucose, Bld: 148 mg/dL — ABNORMAL HIGH (ref 70–99)
Potassium: 4.3 mEq/L (ref 3.7–5.3)
Sodium: 136 mEq/L — ABNORMAL LOW (ref 137–147)

## 2014-06-20 LAB — GLUCOSE, CAPILLARY: Glucose-Capillary: 135 mg/dL — ABNORMAL HIGH (ref 70–99)

## 2014-06-20 MED ORDER — TICAGRELOR 90 MG PO TABS: 90.0000 mg | ORAL_TABLET | Freq: Two times a day (BID) | ORAL | Status: DC

## 2014-06-20 MED ORDER — LIVING WELL WITH DIABETES BOOK
Freq: Once | Status: AC
  Administered 2014-06-20
  Filled 2014-06-20: qty 1

## 2014-06-20 MED ORDER — GLYBURIDE-METFORMIN 5-500 MG PO TABS: 1.0000 | ORAL_TABLET | Freq: Two times a day (BID) | ORAL | Status: DC

## 2014-06-20 MED ORDER — SAXAGLIPTIN HCL 5 MG PO TABS
5.0000 mg | ORAL_TABLET | Freq: Every day | ORAL | Status: DC
Start: 2014-06-20 — End: 2017-12-13

## 2014-06-20 NOTE — Care Management Note (Signed)
    Page 1 of 1   06/20/2014     11:19:41 AM CARE MANAGEMENT NOTE 06/20/2014  Patient:  [name] [name], [name] [name]   Account Number:  [id number]  Date Initiated:  06/20/2014  Documentation initiated by:  [facility]  Subjective/Objective Assessment:   80 y.o. male with a history of PAF, HTN and chronic SOB.//Home alone.     Action/Plan:   Procedure: Right Heart Cath, Left Heart Cath, Selective Coronary Angiography, LV angiography, PCI.//Benefits check for Brilinta   Anticipated DC Date:  06/20/2014   Anticipated DC Plan:  [place]  Medication Assistance  CM consult      Choice offered to / List presented to:             Status of service:  Completed, signed off Medicare Important Message given?   (If response is "NO", the following Medicare IM given date fields will be blank) Date Medicare IM given:   Medicare IM given by:   Date Additional Medicare IM given:   Additional Medicare IM given by:    Discharge Disposition:  HOME/SELF CARE  Per UR Regulation:  Reviewed for med. necessity/level of care/duration of stay  If discussed at [place] of Stay Meetings, dates discussed:    Comments:  06/20/14 [place], RN, BSN, [place] [phone] Spoke with pt at bedside regarding benefits check for Brilinta.  Pt has brochure with 30 day free card and refill assistance card intact.  Pt utilizes [facility] on [address] for prescription needs.  NCM called pharmacy to confirm availability of medication.  Information relayed to pt.  Pt verbalizes importance of filling medication upon discharge.

## 2014-06-20 NOTE — Discharge Summary (Signed)
CARDIOLOGY DISCHARGE SUMMARY   Patient ID: [name] [name] MRN: [id number] DOB/AGE: 12/06/1932 80 y.o.  Admit date: 06/19/2014 Discharge date: 06/20/2014  PCP: [name], MD Primary Cardiologist: Dr. Radford Pax  Primary Discharge Diagnosis:  Dyspnea on exertion, anginal equivalent Secondary Discharge Diagnosis:    Hypertension   Hyperlipidemia  Procedures: Right Heart Cath, Left Heart Cath, Selective Coronary Angiography, LV angiography, PCI of the ramus intermediate vessel.   Hospital Course: [name] [name] is a 80 y.o. male with a history of PAF, HTN, DM and chronic SOB, but no CAD. An echocardiogram showed elevated PAS and a stress test was intermediate risk. When [name] [name] came in for a followup appointment, the decision was made to pursue right and left cardiac catheterization. He was admitted for the procedure on 09/02.  Cardiac catheterization results are below. He had normal right heart pressures. His ramus intermedius had a 90% stenosis that was treated with a drug-eluting stent. His LAD was occluded with minimal collateral circulation. The OM 2 had a 50-70% stenosis. There were other vessels with minor disease. His EF was 55%. He tolerated the procedure well.  On 09/04, he was seen by Dr. Beau Fanny and all data were reviewed. His blood sugars were slightly elevated but he is to resume his home medications. His blood pressure was under good control. He was seen by cardiac rehabilitation and educated on stent restrictions, heart-healthy lifestyle modifications and exercise guidelines. He was ambulating without chest pain or shortness of breath and no further inpatient workup is indicated. He is considered stable for discharge, to follow up as an outpatient.   Labs:  Lab Results  Component Value Date   WBC 7.7 06/20/2014   HGB 11.9* 06/20/2014   HCT 36.2* 06/20/2014   MCV 86.4 06/20/2014   PLT 241 06/20/2014     Recent Labs Lab 06/20/14 0243  NA 136*  K 4.3  CL 102  CO2 20  BUN  20  CREATININE 1.07  CALCIUM 8.7  GLUCOSE 148*    Recent Labs  06/18/14 0912  INR 1.1*      Cardiac Cath: 06/19/2014 Procedural Findings:  Hemodynamics  RA 7/6 mean 4 mm Hg  RV 41/5 mm Hg  PA 41/16 mean 27 mm Hg  PCWP 14/17 mean 11 mm Hg  LV 152/11 mm Hg  AO 150/66 mean 96 mm Hg  Oxygen saturations:  PA 69%  AO 95%  Cardiac Output (Fick) 6.9 L/min  Cardiac Index (Fick) 2.9 L/min/meter squared.  Coronary angiography:  Coronary dominance: right  Left mainstem: Short, normal.  Left anterior descending (LAD): The LAD is occluded after a septal perforator. There are minimal right to left collaterals  There is a large ramus intermediate vessel that extends to the apex. There is an 80-90% proximal stenosis with a long segment of disease.  Left circumflex (LCx): The LCx gives off 3 OM branches. The first OM is very small in caliber and without obstructive disease. The second OM is small in caliber with 50-70% disease proximally. There third OM has 30% stenosis.  Right coronary artery (RCA): The RCA is a large dominant vessel. There is a 40% stenosis in the mid vessel.  Left ventriculography: Left ventricular systolic function is normal, LVEF is estimated at 50-55%, there is no significant mitral regurgitation.  At the end of the diagnostic procedure we decided to proceed with PCI of the ramus intermediate vessel. The patient was anticoagulated with IV heparin. Brilinta 180 mg was given orally. Once a  therapeutic ACT was obtained a 6 Fr XBLAD 3.5 guide was used to access the left coronary artery. A prowater wire was used to cross the lesion. The lesion was predilated with a 2.5 mm balloon. The lesion was then stented with a 3.0 x 38 mm Promus stent. The stent was post dilated with a 3.25 mm noncompliant balloon. Following PCI, there was 0% residual stenosis and TIMI 3 flow. Angiography confirmed an excellent result. The patient tolerated the procedure well without complications. He was  transferred to the post procedure area for further monitoring.  Vessel: Ramus intermediate: proximal  Percent stenosis pre- 80-90%  TIMI flow pre- 3  Stent : 3.0 x 38 mm Promus  Percent stenosis post- 0%.  TIMI flow post- 3  Final Conclusions:  1. 2 vessel obstructive CAD. Chronic total occlusion of the LAD with minimal collaterals. Severe disease in a large ramus intermediate.  2. Good LV function  3. Normal right heart and LV filling pressures.  4. Successful stenting of the ramus intermediate with DES.  Recommendations: DAPT for one year. Treat other residual disease medically. He is not a candidate for CTO PCI.  ECG: 06/20/2014  SR, PVCs, No acute ischemic changes  Vent. rate 71 BPM  PR interval 202 ms  QRS duration 118 ms  QT/QTc 434/471 ms  P-R-T axes 60 -63 30   FOLLOW UP PLANS AND APPOINTMENTS Allergies  Allergen Reactions  . Penicillins Rash  . Sulfa Antibiotics Rash     Medication List         amLODipine 5 MG tablet  Commonly known as:  NORVASC  Take 1 tablet (5 mg total) by mouth daily.     aspirin 81 MG tablet  Take 81 mg by mouth daily.     calcium carbonate 600 MG Tabs tablet  Commonly known as:  OS-CAL  Take 600 mg by mouth daily with breakfast.     glyBURIDE-metformin 5-500 MG per tablet  Commonly known as:  GLUCOVANCE  Take 1 tablet by mouth 2 (two) times daily with a meal. Hold for 48 hours, restart on 06/22/2014     metoprolol 50 MG tablet  Commonly known as:  LOPRESSOR  Take 1.5 tablets (75 mg total) by mouth 2 (two) times daily.     ONGLYZA 5 MG Tabs tablet  Generic drug:  saxagliptin HCl  Take 5 mg by mouth daily.     pantoprazole 40 MG tablet  Commonly known as:  PROTONIX  Take 1 tablet (40 mg total) by mouth daily before breakfast.     pioglitazone 30 MG tablet  Commonly known as:  ACTOS  Take 30 mg by mouth daily.     simvastatin 20 MG tablet  Commonly known as:  ZOCOR  Take 20 mg by mouth daily.     ticagrelor 90 MG Tabs  tablet  Commonly known as:  BRILINTA  Take 1 tablet (90 mg total) by mouth 2 (two) times daily.        Discharge Instructions   Amb Referral to Cardiac Rehabilitation    Complete by:  As directed      Diet - low sodium heart healthy    Complete by:  As directed      Diet Carb Modified    Complete by:  As directed      Increase activity slowly    Complete by:  As directed           Follow-up Information   Follow up with [facility]  R, MD. (The office will call)    Specialty:  Cardiology   Contact information:   [address] [address] [phone]       BRING ALL MEDICATIONS WITH YOU TO FOLLOW UP APPOINTMENTS  Time spent with patient to include physician time: 38 min Signed: Rosaria Ferries, PA-C 06/20/2014, 9:22 AM Co-Sign MD  I have examined the patient and reviewed assessment and plan and discussed with patient.  Agree with above as stated.  Dual antiplatelet therapy for at least a year.  WOuld leave decision about anticoagulation for AFib up to primary cardiologist, Dr. Radford Pax.  Etan Vasudevan S.

## 2014-06-20 NOTE — Discharge Instructions (Signed)
PLEASE REMEMBER TO BRING ALL OF YOUR MEDICATIONS TO EACH OF YOUR FOLLOW-UP OFFICE VISITS. ° °PLEASE ATTEND ALL SCHEDULED FOLLOW-UP APPOINTMENTS.  ° °Activity: Increase activity slowly as tolerated. You may shower, but no soaking baths (or swimming) for 1 week. No driving for 2 days. No lifting over 5 lbs for 1 week. No sexual activity for 1 week.  ° °You May Return to Work: in 1 week (if applicable) ° °Wound Care: You may wash cath site gently with soap and water. Keep cath site clean and dry. If you notice pain, swelling, bleeding or pus at your cath site, please call [phone]. ° ° ° °Cardiac Cath Site Care °Refer to this sheet in the next few weeks. These instructions provide you with information on caring for yourself after your procedure. Your caregiver may also give you more specific instructions. Your treatment has been planned according to current medical practices, but problems sometimes occur. Call your caregiver if you have any problems or questions after your procedure. °HOME CARE INSTRUCTIONS °· You may shower 24 hours after the procedure. Remove the bandage (dressing) and gently wash the site with plain soap and water. Gently pat the site dry.  °· Do not apply powder or lotion to the site.  °· Do not sit in a bathtub, swimming pool, or whirlpool for 5 to 7 days.  °· No bending, squatting, or lifting anything over 10 pounds (4.5 kg) as directed by your caregiver.  °· Inspect the site at least twice daily.  °· Do not drive home if you are discharged the same day of the procedure. Have someone else drive you.  °· You may drive 24 hours after the procedure unless otherwise instructed by your caregiver.  °What to expect: °· Any bruising will usually fade within 1 to 2 weeks.  °· Blood that collects in the tissue (hematoma) may be painful to the touch. It should usually decrease in size and tenderness within 1 to 2 weeks.  °SEEK IMMEDIATE MEDICAL CARE IF: °· You have unusual pain at the site or down the  affected limb.  °· You have redness, warmth, swelling, or pain at the site.  °· You have drainage (other than a small amount of blood on the dressing).  °· You have chills.  °· You have a fever or persistent symptoms for more than 72 hours.  °· You have a fever and your symptoms suddenly get worse.  °· Your leg becomes pale, cool, tingly, or numb.  °· You have heavy bleeding from the site. Hold pressure on the site.  °Document Released: 11/05/2010 Document Revised: 09/22/2011 Document Reviewed: 11/05/2010 °ExitCare® Patient Information ©2012 [employer]. ° °

## 2014-06-20 NOTE — Plan of Care (Signed)
Problem: Food- and Nutrition-Related Knowledge Deficit (NB-1.1) Goal: Nutrition education Formal process to instruct or train a patient/client in a skill or to impart knowledge to help patients/clients voluntarily manage or modify food choices and eating behavior to maintain or improve health. Outcome: Completed/Met Date Met:  06/20/14 Nutrition Education Note  RD consulted for nutrition education.  Pt lives alone and does not cook. He eats a egg, bacon, and tomato sandwich in the am which is typically later. 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 Pt reports having no family, does admit to brother/sisters but no one that cooks for him.    RD provided "Low Sodium Nutrition Therapy" handout from the Academy of [employer]. Reviewed patient's dietary recall. Provided examples on ways to decrease sodium intake in diet. Discouraged intake of processed foods and use of salt shaker. Encouraged fresh fruits and vegetables as well as whole grain sources of carbohydrates to maximize fiber intake.   RD discussed why it is important for patient to adhere to diet recommendations. Teach back method used.  Expect poor compliance due to limitations.  Body mass index is 36.98 kg/(m^2). Pt meets criteria for obesity class II based on current BMI.  Current diet order is CHO Modified/Heart Healthy, patient is consuming approximately 100% of meals at this time. Labs and medications reviewed. No further nutrition interventions warranted at this time. RD contact information provided. If additional nutrition issues arise, please re-consult RD.   [place], [place], [place] Pager [phone] After Hours Pager

## 2014-06-20 NOTE — Progress Notes (Signed)
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 An echocardiogram showed elevated PAS and a stress test was intermediate risk. When [name] came in for a followup appointment, the decision was made to pursue right and left cardiac catheterization. He was admitted for the procedure on 09/02.   SUBJECTIVE: Did not rest well. Had some SOB but was able to rest well in a recliner. Could not get comfortable in the bed.   OBJECTIVE Filed Vitals:   06/19/14 2000 06/19/14 2359 06/20/14 0000 06/20/14 0456  BP: 142/45 126/59 120/50 145/111  Pulse: 91 90 73 64  Temp:  97.5 F (36.4 C)  97.2 F (36.2 C)  TempSrc:  Oral  Oral  Resp: 20 20 20 20   Height:      Weight:  257 lb 11.5 oz (116.9 kg)    SpO2: 98% 97% 98% 100%    Intake/Output Summary (Last 24 hours) at 06/20/14 6160 Last data filed at 06/20/14 0400  Gross per 24 hour  Intake 1279.5 ml  Output   1000 ml  Net  279.5 ml   Filed Weights   06/19/14 1006 06/19/14 2359  Weight: 262 lb (118.842 kg) 257 lb 11.5 oz (116.9 kg)    PHYSICAL EXAM General: Well developed, well nourished, male in no acute distress. Head: Normocephalic, atraumatic.  Neck: Supple without bruits, JVD not obviously elevated but difficult to assess due to body habitus. Lungs:  Resp regular and unlabored, few basilar rales, good air exchange. Heart: RRR, S1, S2, no S3, S4, or murmur; no rub. Abdomen: Soft, non-tender, non-distended, BS + x 4.  Extremities: No clubbing, cyanosis, no edema.  Right radial cath site without ecchymosis or hematoma Neuro: Alert and oriented X 3. Moves all extremities spontaneously. Psych: Normal affect.  LABS: CBC: Recent Labs  06/17/14 1237 06/18/14 0912 06/20/14 0243  WBC 9.3 8.4 7.7  NEUTROABS 6.9 6.2  --   HGB 12.7* 12.7* 11.9*  HCT 38.1* 38.5* 36.2*  MCV 87.4 87.8 86.4  PLT  284.0 279.0 241   INR: Recent Labs  06/18/14 0912  INR 1.1*   Basic Metabolic Panel: Recent Labs  06/18/14 0912 06/20/14 0243  NA 134* 136*  K 4.8 4.3  CL 102 102  CO2 25 20  GLUCOSE 158* 148*  BUN 27* 20  CREATININE 1.5 1.07  CALCIUM 9.4 8.7   Thyroid Function Tests: Recent Labs  06/17/14 1237  TSH 1.63   TELE:   SR, frequent PVCs and rare pairs     ECG: 06/20/2014 SR, PVCs, No acute ischemic changes Vent. rate 71 BPM PR interval 202 ms QRS duration 118 ms QT/QTc 434/471 ms P-R-T axes 60 -63 30  Current Medications:  . amLODipine  5 mg Oral Daily  . aspirin  81 mg Oral Daily  . calcium carbonate  600 mg Oral Q breakfast  . linagliptin  5 mg Oral Daily  . metoprolol  75 mg Oral BID  . pantoprazole  40 mg Oral QAC breakfast  . pioglitazone  30 mg Oral Daily  . simvastatin  20 mg Oral q1800  . ticagrelor  90 mg Oral BID      ASSESSMENT AND PLAN: Active Problems:   Shortness of breath, Angina pectoris equivalent - cath on 09/02 showed 90% ramus intermedius that was treated with a 3.0 x 38 mm  Promus stent. He also had an occluded LAD with minimal R-L collaterals, and OM 2 with 50-70% disease. Other vessels had minor disease. His EF was 50-55%. His right heart pressures were normal. He is not a candidate for CTO PCI.   Plan: Ambulate and cardiac rehabilitation to see. Discharge today if does well.  Signed, Rosaria Ferries , PA-C 6:27 AM 06/20/2014  I have examined the patient and reviewed assessment and plan and discussed with patient.  Agree with above as stated.  [place]otin[place] dual antiplatelet therapy.  Storm Sovine S.

## 2014-06-20 NOTE — Progress Notes (Signed)
CARDIAC REHAB PHASE I   PRE:  Rate/Rhythm: 67 SR  BP:  Supine:   Sitting: 99/50  Standing:    SaO2: 96%RA  MODE:  Ambulation: 164 ft   POST:  Rate/Rhythm: 87 SR PVCs  BP:  Supine:   Sitting: 118/65  Standing:    SaO2: 94%RA 0805-0903 Pt walked 164 ft on RA with rolling walker and asst x 1. Some DOE noted. Pt stated walking is limited by his SOB. Pt stated he knew he needed to lose weight but has not been able to exercise. Strongly recommended CRP 2 for pt as they could work with him and if he needed to be switched to pulmonary rehab at later date, they could discuss with him. Pt agreed to referral to [facility]. Gave modified ex ed as pt does not walk much but needs to increase slowly. Pt lives alone and has no children. Gave heart healthy and diabetic diets but feel pt is doing best he can. Keeping HGA1C about 6.9 per pt. Pt stated he is not going to take NTG so he is going to call 911 if any CP. Has brilinta booklet, stressed importance of taking twice a day.    [name] Good, RN BSN  06/20/2014 8:59 AM

## 2014-06-24 LAB — POCT ACTIVATED CLOTTING TIME
Activated Clotting Time: 197 seconds
Activated Clotting Time: 180 seconds
Activated Clotting Time: 191 seconds

## 2014-06-26 ENCOUNTER — Other Ambulatory Visit: Payer: Self-pay | Admitting: Internal Medicine

## 2014-06-26 MED ORDER — PANTOPRAZOLE SODIUM 40 MG PO TBEC: 40.0000 mg | DELAYED_RELEASE_TABLET | Freq: Every day | ORAL | Status: DC

## 2014-06-30 ENCOUNTER — Telehealth: Payer: Self-pay | Admitting: Cardiology

## 2014-06-30 NOTE — Telephone Encounter (Signed)
New Message  Pt called to discuss if the sleep study test is needed. Requests a call back to discuss//sr

## 2014-06-30 NOTE — Telephone Encounter (Signed)
To Dr Radford Pax to advise

## 2014-06-30 NOTE — Telephone Encounter (Signed)
Cath showed normal right heart pressures so ok to cancel sleep study

## 2014-07-01 NOTE — Telephone Encounter (Signed)
Pt is aware and cancelled study for pt.

## 2014-07-10 ENCOUNTER — Encounter (HOSPITAL_BASED_OUTPATIENT_CLINIC_OR_DEPARTMENT_OTHER): Payer: Medicare Other

## 2014-07-11 ENCOUNTER — Encounter: Payer: Self-pay | Admitting: Cardiology

## 2014-07-11 ENCOUNTER — Ambulatory Visit (INDEPENDENT_AMBULATORY_CARE_PROVIDER_SITE_OTHER): Payer: Medicare Other | Admitting: Cardiology

## 2014-07-11 VITALS — BP 128/76 | HR 68 | Ht 66.0 in | Wt 255.0 lb

## 2014-07-11 DIAGNOSIS — E669 Obesity, unspecified: Secondary | ICD-10-CM

## 2014-07-11 DIAGNOSIS — E119 Type 2 diabetes mellitus without complications: Secondary | ICD-10-CM

## 2014-07-11 DIAGNOSIS — I2510 Atherosclerotic heart disease of native coronary artery without angina pectoris: Secondary | ICD-10-CM | POA: Insufficient documentation

## 2014-07-11 DIAGNOSIS — I4949 Other premature depolarization: Secondary | ICD-10-CM

## 2014-07-11 DIAGNOSIS — I493 Ventricular premature depolarization: Secondary | ICD-10-CM

## 2014-07-11 DIAGNOSIS — I272 Pulmonary hypertension, unspecified: Secondary | ICD-10-CM

## 2014-07-11 DIAGNOSIS — I2789 Other specified pulmonary heart diseases: Secondary | ICD-10-CM | POA: Diagnosis not present

## 2014-07-11 DIAGNOSIS — K5190 Ulcerative colitis, unspecified, without complications: Secondary | ICD-10-CM

## 2014-07-11 DIAGNOSIS — I10 Essential (primary) hypertension: Secondary | ICD-10-CM

## 2014-07-11 DIAGNOSIS — K51918 Ulcerative colitis, unspecified with other complication: Secondary | ICD-10-CM

## 2014-07-11 NOTE — Assessment & Plan Note (Signed)
Controlled.  

## 2014-07-11 NOTE — Assessment & Plan Note (Signed)
Sleep study was cancelled

## 2014-07-11 NOTE — Assessment & Plan Note (Signed)
Type 2 NIDDM

## 2014-07-11 NOTE — Assessment & Plan Note (Signed)
He has a follow up with Dr Melvyn Novas next week.

## 2014-07-11 NOTE — Patient Instructions (Addendum)
Your physician recommends that you continue on your current medications as directed. Please refer to the Current Medication list given to you today.  I gave you some Samples of Brilinta and also the number for the Patient Support Service call that number and they will help you.   You are set up to return to see Dr Radford Pax on 10/14/14 at 10:15 am

## 2014-07-11 NOTE — Assessment & Plan Note (Signed)
S/P remote colostomy

## 2014-07-11 NOTE — Assessment & Plan Note (Signed)
Recent work up for [place] included an abnormal Myoview. Subsequent elective cath showed total LAD and RI disease. He underwent RI DES.

## 2014-07-11 NOTE — Progress Notes (Signed)
07/11/2014 [name] [name]   11-24-1932  [id number]  Primary Physicia [name], MD Primary Cardiologist: Dr Radford Pax  HPI:  80 y/o male followed by Dr Radford Pax. He complained of increasing DOE this summer and underwent extensive pulmonary and cardiac testing. His Myoview was positive for LAD ischemia and he had coronary Ca++ on CTA of his chest. Elective cath done 06/19/14 revealed 2 vessel obstructive CAD with chronic total occlusion of the LAD with minimal collaterals. He also had severe disease in a large ramus intermediate. He had preserved LV function and normal right heart and LV filling pressures. He under went DES placement to his RI with good results. He is in the office today for follow up. He says he his exertional dyspnea has improved. He is concerned about affording his Brilinta as he is in the "doughnut hole".     Current Outpatient Prescriptions  Medication Sig Dispense Refill  . amLODipine (NORVASC) 5 MG tablet Take 1 tablet (5 mg total) by mouth daily.  90 tablet  3  . aspirin 81 MG tablet Take 81 mg by mouth daily.      . calcium carbonate (OS-CAL) 600 MG TABS tablet Take 600 mg by mouth daily with breakfast.      . glyBURIDE-metformin (GLUCOVANCE) 5-500 MG per tablet Take 1 tablet by mouth 2 (two) times daily with a meal. Hold for 48 hours, restart on 06/22/2014  60 tablet  1  . metoprolol (LOPRESSOR) 50 MG tablet Take 1.5 tablets (75 mg total) by mouth 2 (two) times daily.  90 tablet  6  . pantoprazole (PROTONIX) 40 MG tablet Take 1 tablet (40 mg total) by mouth daily before breakfast.  90 tablet  1  . pioglitazone (ACTOS) 30 MG tablet Take 30 mg by mouth daily.      . saxagliptin HCl (ONGLYZA) 5 MG TABS tablet Take 1 tablet (5 mg total) by mouth daily.  90 tablet  1  . simvastatin (ZOCOR) 20 MG tablet Take 20 mg by mouth daily.      . ticagrelor (BRILINTA) 90 MG TABS tablet Take 1 tablet (90 mg total) by mouth 2 (two) times daily.  60 tablet  11   No current  facility-administered medications for this visit.    Allergies  Allergen Reactions  . Penicillins Rash  . Sulfa Antibiotics Rash    History   Social History  . Marital Status: Widowed    Spouse Name: N/A    Number of Children: N/A  . Years of Education: N/A   Occupational History  . Not on file.   Social History Main Topics  . Smoking status: Never Smoker   . Smokeless tobacco: Never Used  . Alcohol Use: No  . Drug Use: No  . Sexual Activity: Not on file   Other Topics Concern  . Not on file   Social History Narrative  . No narrative on file     Review of Systems: General: negative for chills, fever, night sweats or weight changes.  Cardiovascular: negative for chest pain, dyspnea on exertion, edema, orthopnea, palpitations, paroxysmal nocturnal dyspnea or shortness of breath Dermatological: negative for rash Respiratory: negative for cough or wheezing Urologic: negative for hematuria Abdominal: negative for nausea, vomiting, diarrhea, bright red blood per rectum, melena, or hematemesis Neurologic: negative for visual changes, syncope, or dizziness All other systems reviewed and are otherwise negative except as noted above.    Blood pressure 128/76, pulse 68, height 5' 6"  (1.676 m), weight 255 lb (  115.667 kg).  General appearance: alert, cooperative, no distress and moderately obese Lungs: clear to auscultation bilaterally and kyphosis Heart: regular rate and rhythm and extra systole noted Extremities: no hematoma Rt radial site   ASSESSMENT AND PLAN:   CAD in native artery- total LAD, s/p RI DES 06/19/14 Recent work up for DOE included an abnormal Myoview. Subsequent elective cath showed total LAD and RI disease. He underwent RI DES.   Pulmonary HTN He has a follow up with Dr Melvyn Novas next week.  Diabetes Type 2 NIDDM  Obesity Sleep study was cancelled  Ulcerative colitis S/P remote colostomy  Benign essential HTN Controlled  Frequent  PVCs .    PLAN  He has a follow up next week with Dr Melvyn Novas and I suggested he keep this. He did have pulmonary HTN on his echo and his PFTs were abnormal, though his Rt heart pressures were normal at cath. I provided the pt Brilinta samples and instructed him to use the company contact number for assistance. He can f/u with Dr Radford Pax in 3 months.   [name] [name] KPA-C 07/11/2014 10:41 AM

## 2014-07-15 ENCOUNTER — Ambulatory Visit (INDEPENDENT_AMBULATORY_CARE_PROVIDER_SITE_OTHER): Payer: Medicare Other | Admitting: Internal Medicine

## 2014-07-15 ENCOUNTER — Encounter: Payer: Self-pay | Admitting: Internal Medicine

## 2014-07-15 VITALS — BP 102/58 | HR 60 | Temp 98.0°F | Ht 67.5 in | Wt 255.0 lb

## 2014-07-15 DIAGNOSIS — R0602 Shortness of breath: Secondary | ICD-10-CM | POA: Diagnosis not present

## 2014-07-15 DIAGNOSIS — R05 Cough: Secondary | ICD-10-CM | POA: Diagnosis not present

## 2014-07-15 DIAGNOSIS — I2789 Other specified pulmonary heart diseases: Secondary | ICD-10-CM | POA: Diagnosis not present

## 2014-07-15 DIAGNOSIS — R059 Cough, unspecified: Secondary | ICD-10-CM | POA: Diagnosis not present

## 2014-07-15 DIAGNOSIS — Z23 Encounter for immunization: Secondary | ICD-10-CM | POA: Diagnosis not present

## 2014-07-15 DIAGNOSIS — I272 Pulmonary hypertension, unspecified: Secondary | ICD-10-CM

## 2014-07-15 DIAGNOSIS — I2510 Atherosclerotic heart disease of native coronary artery without angina pectoris: Secondary | ICD-10-CM

## 2014-07-15 DIAGNOSIS — R058 Other specified cough: Secondary | ICD-10-CM

## 2014-07-15 NOTE — Progress Notes (Signed)
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 Follow-up    SOB cough nonproductive  no real variability in ex tol :  walks downhill to mailbox but walking back moderately uphill has to stop about half way, does not remember last time could do this s stopping but it was probably years ago. Walking in grocery leaning on cart doe x one aisle  Cough is variable so  does not correlate with breathing issues, not productive or worse in ams or related to eating  rec Please see patient coordinator before you leave today  to schedule overnight oxygen test  Pantoprazole (protonix) 40 mg   Take 30-60 min before first meal of the day and ranitidine 150 mg  one bedtime until return to office - this is the best way to tell whether stomach acid is contributing to your problem.   GERD diet ONO RA > canceled  By Dr Radford Pax    07/15/2014 f/u ov/[name] [name] re: doe  Chief Complaint  Patient presents with  . Follow-up    Pt states that his breathing has improved some since last visit, but not back to his normal baseline. Cough is much better,still clears his throat occ.  no longer walking to mb / does ex bike x 5 min and stops and rests but denies this is due to sob  Cough is day not noct, no better on ppi qd ac     No obvious day to day or daytime variabilty or assoc excess or purulent sputum production or  cp or chest tightness, subjective wheeze overt sinus or hb symptoms. No unusual exp hx or h/o childhood pna/ asthma or knowledge of premature birth.  Sleeping ok without nocturnal  or early am exacerbation  of respiratory  c/o's or need for noct saba.  Also denies any obvious fluctuation of symptoms with weather or environmental changes or other aggravating or alleviating factors except as outlined above   Current Medications, Allergies, Complete Past Medical History, Past Surgical History, Family History, and Social History were reviewed in [employer] record.  ROS  The following are not active complaints unless bolded sore throat, dysphagia, dental problems, itching, sneezing,  nasal congestion or excess/ purulent secretions, ear ache,   fever, chills, sweats, unintended wt loss, pleuritic or exertional cp, hemoptysis,  orthopnea pnd or leg swelling, presyncope, palpitations, heartburn, abdominal pain, anorexia, nausea, vomiting, diarrhea  or change in bowel or urinary habits, change in stools or urine, dysuria,hematuria,  rash, arthralgias, visual complaints, headache, numbness weakness or ataxia or problems with walking or coordination,  change in mood/affect or memory.              Objective:   Physical Exam    obese amb wm nad   07/15/2014        255 Wt Readings from Last 3 Encounters:  06/17/14 263 lb (119.296 kg)  05/28/14 258 lb (117.028 kg)  05/15/14 262 lb (118.842 kg)      HEENT: nl dentition, turbinates, and orophanx. Nl external ear canals without cough reflex   NECK :  without JVD/Nodes/TM/ nl carotid upstrokes bilaterally   LUNGS: no acc muscle use, clear to A and P bilaterally without cough on insp or exp maneuvers   CV:  RRR  no s3 or murmur or increase in P2, no edema   ABD:  soft and nontender with nl excursion in the supine position. No bruits or organomegaly, bowel sounds nl  MS:  warm without deformities, calf tenderness, cyanosis or clubbing  SKIN: warm and dry without lesions    NEURO:  alert, approp, no deficits    CTa 06/03/14 1. No evidence of pulmonary embolism. Mild degradation secondary to  respiratory motion.  2. Pulmonary artery enlargement suggests pulmonary  arterial  hypertension.  3. Cardiomegaly with lipomatous hypertrophy of the interatrial  septum. Atherosclerosis, including within the coronary arteries.  4. Upper normal size thoracic nodes are similar and favored to be  reactive.    Recent Labs Lab 06/17/14 1237  NA 135  K 4.7  CL 103  CO2 26  BUN 26*  CREATININE 1.4  GLUCOSE 199*    Recent Labs Lab 06/17/14 1237  HGB 12.7*  HCT 38.1*  WBC 9.3  PLT 284.0      Lab Results  Component Value Date   PROBNP 315.0* 06/17/2014     Lab Results  Component Value Date   TSH 1.63 06/17/2014      Assessment:

## 2014-07-15 NOTE — Assessment & Plan Note (Addendum)
-   pfts 05/2014 no airflow obst/ mild restriction, with insp truncation  - 06/17/2014   Walked RA x one lap @ 185 stopped due to sob s desat  - 07/15/2014  Walked RA @ nl pace x 3 laps @ 185 ft each stopped due to mild sob/ no desat  Clearly better p stent but still  has obesity / deconditioning as a limiting problem> rec wt loss / 30 min of bike exercise daily   Pulmonary f/u prn

## 2014-07-15 NOTE — Assessment & Plan Note (Signed)
Echo 05/28/14 - Left ventricle: The cavity size was normal. Wall thickness was increased in a pattern of mild LVH. Systolic function was normal. The estimated ejection fraction was in the range of 50% to 55%. Doppler parameters are consistent with both elevated ventricular end-diastolic filling pressure and elevated left atrial filling pressure. - Mitral valve: Calcified annulus. - Left atrium: The atrium was moderately dilated. - Atrial septum: No defect or patent foramen ovale was identified. - Pulmonary arteries: PA peak pressure: 53 mm Hg  06/18/2014 rec ono RA > canceled by Dr Radford Pax in lieu of split night study> canceled by Dr Radford Pax  - [place] 06/19/14 pcwp 11   PVR 185 (wnl)  F/u per Dr Radford Pax, main issue is obesity

## 2014-07-15 NOTE — Patient Instructions (Signed)
Ok to stop protonix and see what effect if any it has on the throat clearing   If there are activities you want to do but you feel you your breathing holds you back   Pulmonary follow up is as needed

## 2014-07-15 NOTE — Assessment & Plan Note (Signed)
No response to ppi 07/15/2014 > ok to d/c > ent f/u prn

## 2014-08-04 ENCOUNTER — Telehealth: Payer: Self-pay | Admitting: Cardiology

## 2014-08-04 NOTE — Telephone Encounter (Signed)
New message    Dentist office calling    How many days does patient need to come off blood thinner.

## 2014-08-04 NOTE — Telephone Encounter (Signed)
Advised patient may not come off/stop blood thinner.

## 2014-09-25 ENCOUNTER — Encounter (HOSPITAL_COMMUNITY): Payer: Self-pay | Admitting: Cardiology

## 2014-10-14 ENCOUNTER — Encounter: Payer: Self-pay | Admitting: Cardiology

## 2014-10-14 ENCOUNTER — Ambulatory Visit (INDEPENDENT_AMBULATORY_CARE_PROVIDER_SITE_OTHER): Payer: Medicare Other | Admitting: Cardiology

## 2014-10-14 VITALS — BP 102/60 | HR 67 | Ht 67.5 in | Wt 254.8 lb

## 2014-10-14 DIAGNOSIS — E785 Hyperlipidemia, unspecified: Secondary | ICD-10-CM

## 2014-10-14 DIAGNOSIS — E669 Obesity, unspecified: Secondary | ICD-10-CM | POA: Diagnosis not present

## 2014-10-14 DIAGNOSIS — I10 Essential (primary) hypertension: Secondary | ICD-10-CM | POA: Diagnosis not present

## 2014-10-14 DIAGNOSIS — I2510 Atherosclerotic heart disease of native coronary artery without angina pectoris: Secondary | ICD-10-CM

## 2014-10-14 DIAGNOSIS — K5190 Ulcerative colitis, unspecified, without complications: Secondary | ICD-10-CM | POA: Diagnosis not present

## 2014-10-14 DIAGNOSIS — I493 Ventricular premature depolarization: Secondary | ICD-10-CM

## 2014-10-14 DIAGNOSIS — R0602 Shortness of breath: Secondary | ICD-10-CM

## 2014-10-14 DIAGNOSIS — Z6839 Body mass index (BMI) 39.0-39.9, adult: Secondary | ICD-10-CM

## 2014-10-14 DIAGNOSIS — E119 Type 2 diabetes mellitus without complications: Secondary | ICD-10-CM

## 2014-10-14 LAB — BRAIN NATRIURETIC PEPTIDE: Pro B Natriuretic peptide (BNP): 459 pg/mL — ABNORMAL HIGH (ref 0.0–100.0)

## 2014-10-14 MED ORDER — CLOPIDOGREL BISULFATE 75 MG PO TABS: 75.0000 mg | ORAL_TABLET | Freq: Every day | ORAL | Status: DC

## 2014-10-14 NOTE — Progress Notes (Signed)
[place], [address] [place], [place]  [address] Phone: [phone] Fax:  [phone]  Date:  10/14/2014   ID:  [name] [name], DOB 16-Aug-1933, MRN [id number]  PCP:  [name], MD  Cardiologist:  [name], MD    History of Present Illness: [name] [name] is a 80 y.o. male with a history of PAF,and chronic SOB. Chest CT angio, done for w/u of pulmonary HTN, showed enlarged pulmonary arteries and atherosclerosis of the coronary arteries. Nuclear stress test showed an intermediate risk stress nuclear study with medium size mild severity ischemia in the LAD territory and a small scar with periinfarct ischemia in the PDA territory (total SDS 6). He underwent cardiac cath showing 2 vessel ASCAD with chronic total occlusion of the LAD with minimal collaterals and severe disease of the large ramus and normal LVF.  He underwent DES to the ramus.  He is on DAPT.  Right heart cath demonstrated no evidence of pulmonary HTN.  He denies any chest pain, LE edema, dizziness, palpitations or syncope. He has chronic SOB which significantly limits is activities. He thinks the SOB may be worse since going on the Brilinta.    Wt Readings from Last 3 Encounters:  10/14/14 254 lb 12.8 oz (115.577 kg)  07/15/14 255 lb (115.667 kg)  07/11/14 255 lb (115.667 kg)     Past Medical History  Diagnosis Date  . Diabetes   . Obesity   . Ulcerative colitis   . HTN (hypertension)   . Dyslipidemia   . GERD (gastroesophageal reflux disease)   . History of peptic ulcer disease   . Ulcerative colitis     s/p total colectomy  . Anemia   . Macular degeneration   . Kidney stone may 2009    Right hydronephrosis, S/P stone removal  . Small bowel obstruction, partial 2009    Episode  . Shingles     episode  . Compression fracture of L1 lumbar vertebra   . Osteopenia   . Lower back pain   . OA (osteoarthritis) of hip   . CKD (chronic kidney disease), stage III   . Hx of acute renal failure  01/02/10-3/24-11    due to hypovolemic shock, gastroenteritis and dehydration,hospitalized . Did requre a few days of dialysis. Cr at discharge was 1.8  . A-fib     s/p ablation, NSR  . Asymptomatic PVCs   . Complication of anesthesia     " during my kidney stone surgery my heart went out of rtyhm  . CAD (coronary artery disease) 06/2014    Occluded LAD with faint collaterals, 90% ramus s/p DES    Current Outpatient Prescriptions  Medication Sig Dispense Refill  . amLODipine (NORVASC) 5 MG tablet Take 1 tablet (5 mg total) by mouth daily. 90 tablet 3  . aspirin 81 MG tablet Take 81 mg by mouth daily.    . calcium carbonate (OS-CAL) 600 MG TABS tablet Take 600 mg by mouth daily with breakfast.    . glyBURIDE-metformin (GLUCOVANCE) 5-500 MG per tablet Take 1 tablet by mouth 2 (two) times daily with a meal. Hold for 48 hours, restart on 06/22/2014 60 tablet 1  . metoprolol (LOPRESSOR) 50 MG tablet Take 1.5 tablets (75 mg total) by mouth 2 (two) times daily. 90 tablet 6  . pioglitazone (ACTOS) 30 MG tablet Take 30 mg by mouth daily.    . ranitidine (ZANTAC) 75 MG tablet Take 150 mg by mouth 2 (two) times daily. Patient unsure  of exact dosage    . saxagliptin HCl (ONGLYZA) 5 MG TABS tablet Take 1 tablet (5 mg total) by mouth daily. 90 tablet 1  . simvastatin (ZOCOR) 20 MG tablet Take 20 mg by mouth daily.     No current facility-administered medications for this visit.    Allergies:    Allergies  Allergen Reactions  . Penicillins Rash  . Sulfa Antibiotics Rash    Social History:  The patient  reports that he has never smoked. He has never used smokeless tobacco. He reports that he does not drink alcohol or use illicit drugs.   Family History:  The patient's family history includes Colon cancer in his mother.   ROS:  Please see the history of present illness.      All other systems reviewed and negative.   PHYSICAL EXAM: VS:  BP 102/60 mmHg  Pulse 67  Ht 5' 7.5" (1.715 m)  Wt 254  lb 12.8 oz (115.577 kg)  BMI 39.30 kg/m2  SpO2 98% Well nourished, well developed, in no acute distress HEENT: normal Neck: no JVD Cardiac:  normal S1, S2; RRR; no murmur Lungs:  clear to auscultation bilaterally, no wheezing, rhonchi or rales Abd: soft, nontender, no hepatomegaly Ext: no edema Skin: warm and dry Neuro:  CNs 2-12 intact, no focal abnormalities noted   ASSESSMENT AND PLAN:   CAD in native artery- total LAD, s/p RI DES 06/19/14 Recent work up for DOE included an abnormal Myoview. Subsequent elective cath showed total LAD and RI disease. He underwent RI DES. Continue DAPT with ASA and change Brilinta to Plavix due to SOB.    Pulmonary HTN - no evidence of pulmonary HTN on recent right heart cath   Diabetes Type 2 NIDDM  Obesity Sleep study was cancelled since no evidence of pulmonary HTN on cath  Ulcerative colitis S/P remote colostomy  Benign essential HTN Controlled.  Continue amlodipine/metoprolol  Frequent PVCs  Dyslipidemia - continue Zocor.  Check FLP and ALT  SOB - he seems to think the SOB may be worse since going on Brilinta so I will change him to Plavix.  I will also check a BNP.  He does not appear volume overloaded.  Followup with PA in 4 weeks to see if SOB has improved  Followup with me in  6 months   Signed, [name], MD [facility] 10/14/2014 10:55 AM

## 2014-10-14 NOTE — Patient Instructions (Addendum)
Your physician recommends that you return for lab work in: TODAY (BNP)  Your physician recommends that you return for [place] lab work in: 10/20/2014 (Fasting Lipids and ALT)  Your physician has recommended you make the following change in your medication:  1) STOP Brilinta 2) START Plavix 75 mg daily  Your physician recommends that you schedule a follow-up appointment in 4 Weeks with the Physicians Assistant   Your physician wants you to follow-up in: 6 months with Dr. Radford Pax. You will receive a reminder letter in the mail two months in advance. If you don't receive a letter, please call our office to schedule the follow-up appointment.

## 2014-10-15 ENCOUNTER — Telehealth: Payer: Self-pay

## 2014-10-15 DIAGNOSIS — R0602 Shortness of breath: Secondary | ICD-10-CM

## 2014-10-15 DIAGNOSIS — I10 Essential (primary) hypertension: Secondary | ICD-10-CM

## 2014-10-15 MED ORDER — FUROSEMIDE 20 MG PO TABS: 20.0000 mg | ORAL_TABLET | Freq: Every day | ORAL | Status: DC

## 2014-10-15 NOTE — Telephone Encounter (Signed)
-----   Message from Sueanne Margarita, MD sent at 10/14/2014  1:28 PM EST ----- BNP is mildly elevated with SOB.  Please add Lasix 34m and have him take 2 tablets daily for 2 days then 1 tablet daily and check BMET in 1 week

## 2014-10-15 NOTE — Telephone Encounter (Signed)
Patient informed of lab results and verbal understanding expressed.  Instructed patient to START lasix 20 mg daily with an extra dose (40 mg total) the first two days. Repeat BMET next Thursday.  Patient agrees with treatment plan.

## 2014-10-20 ENCOUNTER — Other Ambulatory Visit: Payer: Medicare Other

## 2014-10-22 ENCOUNTER — Encounter (HOSPITAL_COMMUNITY): Payer: Self-pay

## 2014-10-22 ENCOUNTER — Emergency Department (HOSPITAL_COMMUNITY): Payer: Medicare Other

## 2014-10-22 ENCOUNTER — Inpatient Hospital Stay (HOSPITAL_COMMUNITY)
Admission: EM | Admit: 2014-10-22 | Discharge: 2014-10-27 | DRG: 812 | Disposition: A | Discharge status: Home / Self Care | Payer: Medicare Other | Attending: Internal Medicine | Admitting: Internal Medicine

## 2014-10-22 DIAGNOSIS — Z955 Presence of coronary angioplasty implant and graft: Secondary | ICD-10-CM | POA: Diagnosis not present

## 2014-10-22 DIAGNOSIS — R0602 Shortness of breath: Secondary | ICD-10-CM | POA: Diagnosis not present

## 2014-10-22 DIAGNOSIS — E860 Dehydration: Secondary | ICD-10-CM | POA: Diagnosis present

## 2014-10-22 DIAGNOSIS — M8580 Other specified disorders of bone density and structure, unspecified site: Secondary | ICD-10-CM | POA: Diagnosis present

## 2014-10-22 DIAGNOSIS — R1084 Generalized abdominal pain: Secondary | ICD-10-CM

## 2014-10-22 DIAGNOSIS — R1030 Lower abdominal pain, unspecified: Secondary | ICD-10-CM | POA: Diagnosis not present

## 2014-10-22 DIAGNOSIS — I2510 Atherosclerotic heart disease of native coronary artery without angina pectoris: Secondary | ICD-10-CM | POA: Diagnosis present

## 2014-10-22 DIAGNOSIS — Z6839 Body mass index (BMI) 39.0-39.9, adult: Secondary | ICD-10-CM | POA: Diagnosis not present

## 2014-10-22 DIAGNOSIS — K51918 Ulcerative colitis, unspecified with other complication: Secondary | ICD-10-CM | POA: Diagnosis not present

## 2014-10-22 DIAGNOSIS — I4891 Unspecified atrial fibrillation: Secondary | ICD-10-CM | POA: Diagnosis present

## 2014-10-22 DIAGNOSIS — E1165 Type 2 diabetes mellitus with hyperglycemia: Secondary | ICD-10-CM | POA: Diagnosis present

## 2014-10-22 DIAGNOSIS — Z882 Allergy status to sulfonamides status: Secondary | ICD-10-CM | POA: Diagnosis not present

## 2014-10-22 DIAGNOSIS — I480 Paroxysmal atrial fibrillation: Secondary | ICD-10-CM | POA: Diagnosis not present

## 2014-10-22 DIAGNOSIS — Z7982 Long term (current) use of aspirin: Secondary | ICD-10-CM | POA: Diagnosis not present

## 2014-10-22 DIAGNOSIS — D509 Iron deficiency anemia, unspecified: Principal | ICD-10-CM | POA: Diagnosis present

## 2014-10-22 DIAGNOSIS — N132 Hydronephrosis with renal and ureteral calculous obstruction: Secondary | ICD-10-CM | POA: Diagnosis not present

## 2014-10-22 DIAGNOSIS — E785 Hyperlipidemia, unspecified: Secondary | ICD-10-CM | POA: Diagnosis present

## 2014-10-22 DIAGNOSIS — I517 Cardiomegaly: Secondary | ICD-10-CM | POA: Diagnosis not present

## 2014-10-22 DIAGNOSIS — Z8711 Personal history of peptic ulcer disease: Secondary | ICD-10-CM

## 2014-10-22 DIAGNOSIS — I129 Hypertensive chronic kidney disease with stage 1 through stage 4 chronic kidney disease, or unspecified chronic kidney disease: Secondary | ICD-10-CM | POA: Diagnosis present

## 2014-10-22 DIAGNOSIS — R627 Adult failure to thrive: Secondary | ICD-10-CM | POA: Diagnosis present

## 2014-10-22 DIAGNOSIS — Z933 Colostomy status: Secondary | ICD-10-CM

## 2014-10-22 DIAGNOSIS — K5190 Ulcerative colitis, unspecified, without complications: Secondary | ICD-10-CM | POA: Diagnosis present

## 2014-10-22 DIAGNOSIS — H3530 Unspecified macular degeneration: Secondary | ICD-10-CM | POA: Diagnosis present

## 2014-10-22 DIAGNOSIS — Z9049 Acquired absence of other specified parts of digestive tract: Secondary | ICD-10-CM | POA: Diagnosis present

## 2014-10-22 DIAGNOSIS — N200 Calculus of kidney: Secondary | ICD-10-CM | POA: Diagnosis not present

## 2014-10-22 DIAGNOSIS — N183 Chronic kidney disease, stage 3 (moderate): Secondary | ICD-10-CM | POA: Diagnosis present

## 2014-10-22 DIAGNOSIS — E119 Type 2 diabetes mellitus without complications: Secondary | ICD-10-CM | POA: Diagnosis not present

## 2014-10-22 DIAGNOSIS — I959 Hypotension, unspecified: Secondary | ICD-10-CM

## 2014-10-22 DIAGNOSIS — K922 Gastrointestinal hemorrhage, unspecified: Secondary | ICD-10-CM

## 2014-10-22 DIAGNOSIS — E669 Obesity, unspecified: Secondary | ICD-10-CM | POA: Diagnosis present

## 2014-10-22 DIAGNOSIS — K51911 Ulcerative colitis, unspecified with rectal bleeding: Secondary | ICD-10-CM | POA: Diagnosis present

## 2014-10-22 DIAGNOSIS — D649 Anemia, unspecified: Secondary | ICD-10-CM

## 2014-10-22 DIAGNOSIS — E861 Hypovolemia: Secondary | ICD-10-CM | POA: Diagnosis present

## 2014-10-22 DIAGNOSIS — N201 Calculus of ureter: Secondary | ICD-10-CM | POA: Diagnosis not present

## 2014-10-22 DIAGNOSIS — K219 Gastro-esophageal reflux disease without esophagitis: Secondary | ICD-10-CM | POA: Diagnosis present

## 2014-10-22 DIAGNOSIS — Z880 Allergy status to penicillin: Secondary | ICD-10-CM | POA: Diagnosis not present

## 2014-10-22 DIAGNOSIS — I059 Rheumatic mitral valve disease, unspecified: Secondary | ICD-10-CM | POA: Diagnosis not present

## 2014-10-22 DIAGNOSIS — K2961 Other gastritis with bleeding: Secondary | ICD-10-CM | POA: Diagnosis not present

## 2014-10-22 DIAGNOSIS — R109 Unspecified abdominal pain: Secondary | ICD-10-CM | POA: Diagnosis not present

## 2014-10-22 DIAGNOSIS — N179 Acute kidney failure, unspecified: Secondary | ICD-10-CM | POA: Diagnosis present

## 2014-10-22 DIAGNOSIS — Z7902 Long term (current) use of antithrombotics/antiplatelets: Secondary | ICD-10-CM

## 2014-10-22 DIAGNOSIS — R195 Other fecal abnormalities: Secondary | ICD-10-CM | POA: Diagnosis not present

## 2014-10-22 DIAGNOSIS — I4819 Other persistent atrial fibrillation: Secondary | ICD-10-CM | POA: Insufficient documentation

## 2014-10-22 DIAGNOSIS — N1330 Unspecified hydronephrosis: Secondary | ICD-10-CM | POA: Diagnosis not present

## 2014-10-22 HISTORY — DX: Personal history of other medical treatment: Z92.89

## 2014-10-22 LAB — CBC WITH DIFFERENTIAL/PLATELET
Basophils Absolute: 0 10*3/uL (ref 0.0–0.1)
Basophils Relative: 0 % (ref 0–1)
Eosinophils Absolute: 0.1 10*3/uL (ref 0.0–0.7)
Eosinophils Relative: 1 % (ref 0–5)
HCT: 29 % — ABNORMAL LOW (ref 39.0–52.0)
Hemoglobin: 8.8 g/dL — ABNORMAL LOW (ref 13.0–17.0)
Lymphocytes Relative: 10 % — ABNORMAL LOW (ref 12–46)
Lymphs Abs: 1.2 10*3/uL (ref 0.7–4.0)
MCH: 24.2 pg — ABNORMAL LOW (ref 26.0–34.0)
MCHC: 30.3 g/dL (ref 30.0–36.0)
MCV: 79.9 fL (ref 78.0–100.0)
Monocytes Absolute: 1.1 10*3/uL — ABNORMAL HIGH (ref 0.1–1.0)
Monocytes Relative: 9 % (ref 3–12)
Neutro Abs: 9.9 10*3/uL — ABNORMAL HIGH (ref 1.7–7.7)
Neutrophils Relative %: 80 % — ABNORMAL HIGH (ref 43–77)
Platelets: 414 10*3/uL — ABNORMAL HIGH (ref 150–400)
RBC: 3.63 MIL/uL — ABNORMAL LOW (ref 4.22–5.81)
RDW: 15.1 % (ref 11.5–15.5)
WBC: 12.3 10*3/uL — ABNORMAL HIGH (ref 4.0–10.5)

## 2014-10-22 LAB — URINALYSIS, ROUTINE W REFLEX MICROSCOPIC
Bilirubin Urine: NEGATIVE
Glucose, UA: NEGATIVE mg/dL
Ketones, ur: NEGATIVE mg/dL
Leukocytes, UA: NEGATIVE
Nitrite: NEGATIVE
Protein, ur: NEGATIVE mg/dL
Specific Gravity, Urine: 1.021 (ref 1.005–1.030)
Urobilinogen, UA: 0.2 mg/dL (ref 0.0–1.0)
pH: 5 (ref 5.0–8.0)

## 2014-10-22 LAB — COMPREHENSIVE METABOLIC PANEL
ALT: 9 U/L (ref 0–53)
AST: 21 U/L (ref 0–37)
Albumin: 3.2 g/dL — ABNORMAL LOW (ref 3.5–5.2)
Alkaline Phosphatase: 133 U/L — ABNORMAL HIGH (ref 39–117)
Anion gap: 11 (ref 5–15)
BUN: 46 mg/dL — ABNORMAL HIGH (ref 6–23)
CO2: 19 mmol/L (ref 19–32)
Calcium: 9 mg/dL (ref 8.4–10.5)
Chloride: 106 mEq/L (ref 96–112)
Creatinine, Ser: 2.27 mg/dL — ABNORMAL HIGH (ref 0.50–1.35)
GFR calc Af Amer: 30 mL/min — ABNORMAL LOW (ref >90)
GFR calc non Af Amer: 26 mL/min — ABNORMAL LOW (ref >90)
Glucose, Bld: 165 mg/dL — ABNORMAL HIGH (ref 70–99)
Potassium: 5 mmol/L (ref 3.5–5.1)
Sodium: 136 mmol/L (ref 135–145)
Total Bilirubin: 0.7 mg/dL (ref 0.3–1.2)
Total Protein: 7.2 g/dL (ref 6.0–8.3)

## 2014-10-22 LAB — CBG MONITORING, ED: Glucose-Capillary: 152 mg/dL — ABNORMAL HIGH (ref 70–99)

## 2014-10-22 LAB — I-STAT CG4 LACTIC ACID, ED: Lactic Acid, Venous: 2.09 mmol/L (ref 0.5–2.2)

## 2014-10-22 LAB — PREPARE RBC (CROSSMATCH)

## 2014-10-22 LAB — URINE MICROSCOPIC-ADD ON

## 2014-10-22 LAB — RETICULOCYTES
RBC.: 3.54 MIL/uL — ABNORMAL LOW (ref 4.22–5.81)
Retic Count, Absolute: 77.9 10*3/uL (ref 19.0–186.0)
Retic Ct Pct: 2.2 % (ref 0.4–3.1)

## 2014-10-22 LAB — ABO/RH: ABO/RH(D): A POS

## 2014-10-22 LAB — PROTIME-INR
INR: 1.19 (ref 0.00–1.49)
Prothrombin Time: 15.2 seconds (ref 11.6–15.2)

## 2014-10-22 LAB — BRAIN NATRIURETIC PEPTIDE: B Natriuretic Peptide: 384.1 pg/mL — ABNORMAL HIGH (ref 0.0–100.0)

## 2014-10-22 LAB — TROPONIN I: Troponin I: <0.03 ng/mL (ref <0.031)

## 2014-10-22 LAB — APTT: aPTT: 34 seconds (ref 24–37)

## 2014-10-22 LAB — GLUCOSE, CAPILLARY: Glucose-Capillary: 185 mg/dL — ABNORMAL HIGH (ref 70–99)

## 2014-10-22 LAB — POC OCCULT BLOOD, ED: Fecal Occult Bld: POSITIVE — AB

## 2014-10-22 MED ORDER — ONDANSETRON HCL 4 MG/2ML IJ SOLN: 4.0000 mg | Freq: Four times a day (QID) | INTRAMUSCULAR | Status: DC | PRN

## 2014-10-22 MED ORDER — MORPHINE SULFATE 2 MG/ML IJ SOLN: 1.0000 mg | INTRAMUSCULAR | Status: DC | PRN

## 2014-10-22 MED ORDER — HEPARIN SODIUM (PORCINE) 5000 UNIT/ML IJ SOLN
5000.0000 [IU] | Freq: Three times a day (TID) | INTRAMUSCULAR | Status: DC
  Administered 2014-10-23: 5000 [IU] via SUBCUTANEOUS
  Filled 2014-10-22 (×4): qty 1

## 2014-10-22 MED ORDER — ONDANSETRON HCL 4 MG PO TABS: 4.0000 mg | ORAL_TABLET | Freq: Four times a day (QID) | ORAL | Status: DC | PRN

## 2014-10-22 MED ORDER — SODIUM CHLORIDE 0.9 % IJ SOLN
3.0000 mL | Freq: Two times a day (BID) | INTRAMUSCULAR | Status: DC
  Administered 2014-10-22 – 2014-10-27 (×7): 3 mL via INTRAVENOUS

## 2014-10-22 MED ORDER — ACETAMINOPHEN 325 MG PO TABS: 650.0000 mg | ORAL_TABLET | Freq: Four times a day (QID) | ORAL | Status: DC | PRN

## 2014-10-22 MED ORDER — INSULIN ASPART 100 UNIT/ML ~~LOC~~ SOLN
0.0000 [IU] | Freq: Three times a day (TID) | SUBCUTANEOUS | Status: DC
  Administered 2014-10-23: 1 [IU] via SUBCUTANEOUS
  Administered 2014-10-23: 2 [IU] via SUBCUTANEOUS
  Administered 2014-10-24 (×2): 1 [IU] via SUBCUTANEOUS
  Administered 2014-10-25: 2 [IU] via SUBCUTANEOUS
  Administered 2014-10-25: 3 [IU] via SUBCUTANEOUS
  Administered 2014-10-25 – 2014-10-26 (×2): 2 [IU] via SUBCUTANEOUS
  Administered 2014-10-26 (×2): 1 [IU] via SUBCUTANEOUS
  Administered 2014-10-27: 2 [IU] via SUBCUTANEOUS
  Administered 2014-10-27: 1 [IU] via SUBCUTANEOUS

## 2014-10-22 MED ORDER — LINAGLIPTIN 5 MG PO TABS
5.0000 mg | ORAL_TABLET | Freq: Every day | ORAL | Status: DC
  Administered 2014-10-22 – 2014-10-27 (×6): 5 mg via ORAL
  Filled 2014-10-22 (×6): qty 1

## 2014-10-22 MED ORDER — SODIUM CHLORIDE 0.9 % IV SOLN
INTRAVENOUS | Status: DC
  Administered 2014-10-23 (×2): via INTRAVENOUS

## 2014-10-22 MED ORDER — ASPIRIN 81 MG PO CHEW
81.0000 mg | CHEWABLE_TABLET | Freq: Every day | ORAL | Status: DC
  Administered 2014-10-22 – 2014-10-27 (×6): 81 mg via ORAL
  Filled 2014-10-22 (×6): qty 1

## 2014-10-22 MED ORDER — HYDROCODONE-ACETAMINOPHEN 5-325 MG PO TABS: 1.0000 | ORAL_TABLET | ORAL | Status: DC | PRN

## 2014-10-22 MED ORDER — IOHEXOL 300 MG/ML  SOLN: 25.0000 mL | INTRAMUSCULAR | Status: DC | PRN

## 2014-10-22 MED ORDER — METOPROLOL TARTRATE 50 MG PO TABS
75.0000 mg | ORAL_TABLET | Freq: Two times a day (BID) | ORAL | Status: DC
  Administered 2014-10-22 – 2014-10-23 (×2): 75 mg via ORAL
  Filled 2014-10-22 (×3): qty 1

## 2014-10-22 MED ORDER — SODIUM CHLORIDE 0.9 % IV SOLN
INTRAVENOUS | Status: AC
  Administered 2014-10-22: 16:00:00 via INTRAVENOUS

## 2014-10-22 MED ORDER — CLOPIDOGREL BISULFATE 75 MG PO TABS
75.0000 mg | ORAL_TABLET | Freq: Every day | ORAL | Status: DC
  Administered 2014-10-22 – 2014-10-27 (×6): 75 mg via ORAL
  Filled 2014-10-22 (×6): qty 1

## 2014-10-22 MED ORDER — SODIUM CHLORIDE 0.9 % IV SOLN
Freq: Once | INTRAVENOUS | Status: AC
  Administered 2014-10-22: 19:00:00 via INTRAVENOUS

## 2014-10-22 MED ORDER — AMLODIPINE BESYLATE 5 MG PO TABS
5.0000 mg | ORAL_TABLET | Freq: Every day | ORAL | Status: DC
  Administered 2014-10-22 – 2014-10-23 (×2): 5 mg via ORAL
  Filled 2014-10-22 (×2): qty 1

## 2014-10-22 MED ORDER — ACETAMINOPHEN 650 MG RE SUPP: 650.0000 mg | Freq: Four times a day (QID) | RECTAL | Status: DC | PRN

## 2014-10-22 MED ORDER — SIMVASTATIN 20 MG PO TABS
20.0000 mg | ORAL_TABLET | Freq: Every day | ORAL | Status: DC
  Administered 2014-10-22 – 2014-10-26 (×5): 20 mg via ORAL
  Filled 2014-10-22 (×6): qty 1

## 2014-10-22 NOTE — ED Provider Notes (Signed)
CSN: [id number]     Arrival date & time 10/22/14  3154 History   First MD Initiated Contact with Patient 10/22/14 1000     Chief Complaint  Patient presents with  . Dizziness  . Hyperglycemia     (Consider location/radiation/quality/duration/timing/severity/associated sxs/prior Treatment) HPI Comments: Patient presents to the ER for evaluation of weakness and dizziness. Patient has been experiencing ongoing shortness of breath for several years. He sees a cardiologist and pulmonology for this. Patient has had significant worsening of dyspnea on exertion recently. He was placed on diuretics last week by his cardiologist, Dr. Radford Pax. Patient reports that he has been experiencing increased weakness, dizziness and difficulty with walking. He called the office this morning and was told to come to the ER. Patient is not expressing any chest pain. No fever or shortness of breath. He does report his blood sugars have been running high since he started the diuretic. He also felt a knot in his right groin last night and had pain through the night in the area of the groin. No nausea, vomiting, diarrhea or constipation.  Patient is a 81 y.o. male presenting with dizziness and hyperglycemia.  Dizziness Associated symptoms: shortness of breath   Associated symptoms: no chest pain   Hyperglycemia Associated symptoms: abdominal pain, dizziness, fatigue and shortness of breath   Associated symptoms: no chest pain     Past Medical History  Diagnosis Date  . Diabetes   . Obesity   . Ulcerative colitis   . HTN (hypertension)   . Dyslipidemia   . GERD (gastroesophageal reflux disease)   . History of peptic ulcer disease   . Ulcerative colitis     s/p total colectomy  . Anemia   . Macular degeneration   . Kidney stone may 2009    Right hydronephrosis, S/P stone removal  . Small bowel obstruction, partial 2009    Episode  . Shingles     episode  . Compression fracture of L1 lumbar vertebra   .  Osteopenia   . Lower back pain   . OA (osteoarthritis) of hip   . CKD (chronic kidney disease), stage III   . Hx of acute renal failure 01/02/10-3/24-11    due to hypovolemic shock, gastroenteritis and dehydration,hospitalized . Did requre a few days of dialysis. Cr at discharge was 1.8  . A-fib     s/p ablation, NSR  . Asymptomatic PVCs   . Complication of anesthesia     " during my kidney stone surgery my heart went out of rtyhm  . CAD (coronary artery disease) 06/2014    Occluded LAD with faint collaterals, 90% ramus s/p DES   Past Surgical History  Procedure Laterality Date  . Back surgery    . Colon resection    . Hernia repair    . Tonsillectomy    . Cardiac catheterization  06/19/2014  . Coronary angioplasty    . Coronary stent placement  06/19/2014    DES       dr Martinique  . Left and right heart catheterization with coronary angiogram N/A 06/19/2014    Procedure: LEFT AND RIGHT HEART CATHETERIZATION WITH CORONARY ANGIOGRAM;  Surgeon: Peter M Martinique, MD;  Location: [facility] CATH LAB;  Service: Cardiovascular;  Laterality: N/A;   Family History  Problem Relation Age of Onset  . Colon cancer Mother    History  Substance Use Topics  . Smoking status: Never Smoker   . Smokeless tobacco: Never Used  . Alcohol Use: No  Review of Systems  Constitutional: Positive for fatigue.  Respiratory: Positive for shortness of breath.   Cardiovascular: Negative for chest pain.  Gastrointestinal: Positive for abdominal pain.  Neurological: Positive for dizziness.  All other systems reviewed and are negative.     Allergies  Penicillins and Sulfa antibiotics  Home Medications   Prior to Admission medications   Medication Sig Start Date End Date Taking? Authorizing Provider  amLODipine (NORVASC) 5 MG tablet Take 1 tablet (5 mg total) by mouth daily. 05/09/14  Yes Sueanne Margarita, MD  aspirin 81 MG tablet Take 81 mg by mouth daily.   Yes Historical Provider, MD  calcium carbonate  (OS-CAL) 600 MG TABS tablet Take 600 mg by mouth daily with breakfast.   Yes Historical Provider, MD  clopidogrel (PLAVIX) 75 MG tablet Take 1 tablet (75 mg total) by mouth daily. 10/14/14  Yes Sueanne Margarita, MD  furosemide (LASIX) 20 MG tablet Take 1 tablet (20 mg total) by mouth daily. Take 40 mg your first two days. 10/15/14  Yes Sueanne Margarita, MD  glyBURIDE-metformin (GLUCOVANCE) 5-500 MG per tablet Take 1 tablet by mouth 2 (two) times daily with a meal. Hold for 48 hours, restart on 06/22/2014 06/20/14  Yes Rhonda G Barrett, PA-C  metoprolol (LOPRESSOR) 50 MG tablet Take 1.5 tablets (75 mg total) by mouth 2 (two) times daily. 05/06/14  Yes Sueanne Margarita, MD  pioglitazone (ACTOS) 30 MG tablet Take 30 mg by mouth daily.   Yes Historical Provider, MD  ranitidine (ZANTAC) 75 MG tablet Take 150 mg by mouth 2 (two) times daily. Patient unsure of exact dosage   Yes Historical Provider, MD  saxagliptin HCl (ONGLYZA) 5 MG TABS tablet Take 1 tablet (5 mg total) by mouth daily. 06/20/14  Yes Tanda Rockers, MD  simvastatin (ZOCOR) 20 MG tablet Take 20 mg by mouth daily.   Yes Historical Provider, MD   BP 84/63 mmHg  Pulse 64  Temp(Src) 97.6 F (36.4 C) (Oral)  Resp 17  SpO2 100% Physical Exam  Constitutional: He is oriented to person, place, and time. He appears well-developed and well-nourished. No distress.  HENT:  Head: Normocephalic and atraumatic.  Right Ear: Hearing normal.  Left Ear: Hearing normal.  Nose: Nose normal.  Mouth/Throat: Oropharynx is clear and moist and mucous membranes are normal.  Eyes: Conjunctivae and EOM are normal. Pupils are equal, round, and reactive to light.  Neck: Normal range of motion. Neck supple.  Cardiovascular: Regular rhythm, S1 normal and S2 normal.  Exam reveals no gallop and no friction rub.   No murmur heard. Pulmonary/Chest: Effort normal and breath sounds normal. No respiratory distress. He exhibits no tenderness.  Abdominal: Soft. Normal appearance  and bowel sounds are normal. There is no hepatosplenomegaly. There is no tenderness. There is no rebound, no guarding, no tenderness at McBurney's point and negative Murphy's sign. No hernia.  Musculoskeletal: Normal range of motion.  Neurological: He is alert and oriented to person, place, and time. He has normal strength. No cranial nerve deficit or sensory deficit. Coordination normal. GCS eye subscore is 4. GCS verbal subscore is 5. GCS motor subscore is 6.  Skin: Skin is warm, dry and intact. No rash noted. No cyanosis.  Psychiatric: He has a normal mood and affect. His speech is normal and behavior is normal. Thought content normal.  Nursing note and vitals reviewed.   ED Course  Procedures (including critical care time) Labs Review Labs Reviewed  CBC WITH DIFFERENTIAL -  Abnormal; Notable for the following:    WBC 12.3 (*)    RBC 3.63 (*)    Hemoglobin 8.8 (*)    HCT 29.0 (*)    MCH 24.2 (*)    Platelets 414 (*)    Neutrophils Relative % 80 (*)    Neutro Abs 9.9 (*)    Lymphocytes Relative 10 (*)    Monocytes Absolute 1.1 (*)    All other components within normal limits  COMPREHENSIVE METABOLIC PANEL - Abnormal; Notable for the following:    Glucose, Bld 165 (*)    BUN 46 (*)    Creatinine, Ser 2.27 (*)    Albumin 3.2 (*)    Alkaline Phosphatase 133 (*)    GFR calc non Af Amer 26 (*)    GFR calc Af Amer 30 (*)    All other components within normal limits  URINALYSIS, ROUTINE W REFLEX MICROSCOPIC - Abnormal; Notable for the following:    APPearance CLOUDY (*)    Hgb urine dipstick LARGE (*)    All other components within normal limits  BRAIN NATRIURETIC PEPTIDE - Abnormal; Notable for the following:    B Natriuretic Peptide 384.1 (*)    All other components within normal limits  URINE MICROSCOPIC-ADD ON - Abnormal; Notable for the following:    Bacteria, UA FEW (*)    Crystals CA OXALATE CRYSTALS (*)    All other components within normal limits  CBG MONITORING, ED -  Abnormal; Notable for the following:    Glucose-Capillary 152 (*)    All other components within normal limits  POC OCCULT BLOOD, ED - Abnormal; Notable for the following:    Fecal Occult Bld POSITIVE (*)    All other components within normal limits  URINE CULTURE  TROPONIN I  I-STAT CG4 LACTIC ACID, ED    Imaging Review Dg Chest 2 View  10/22/2014   CLINICAL DATA:  Chronic shortness of breath.  EXAM: CHEST  2 VIEW  COMPARISON:  CT 06/03/2014.  Chest x-ray 01/05/2014.  FINDINGS: Bilateral of the lines noted in good anatomic position. Cardiomegaly with normal pulmonary vascularity. Mild bibasilar subsegmental atelectasis and/or scarring. No pleural effusion or pneumothorax. Stable apical pleural thickening consistent scarring. No acute bony abnormality. Stable prominent compression fracture upper lumbar vertebral body. Prior vertebroplasty .  IMPRESSION: 1. Stable cardiomegaly, no CHF. 2. Mild bibasilar subsegmental atelectasis and/or scar.   Electronically Signed   By: Marcello Moores  Register   On: 10/22/2014 12:01     EKG Interpretation   Date/Time:  Wednesday October 22 2014 10:39:24 EST Ventricular Rate:  62 PR Interval:  189 QRS Duration: 124 QT Interval:  451 QTC Calculation: 458 R Axis:   -58 Text Interpretation:  Sinus rhythm Multiple ventricular premature  complexes RBBB and LAFB No significant change since last tracing Confirmed  by Zackaria Burkey  MD, Lane Kjos ([id number]) on 10/22/2014 10:58:04 AM      MDM   Final diagnoses:  Shortness of breath  Abdominal pain, acute  Symptomatic anemia  AKI (acute kidney injury)  Gastrointestinal hemorrhage, unspecified gastritis, unspecified gastrointestinal hemorrhage type    Patient brought to the emergency department for evaluation of progressively worsening difficulty breathing. Patient has a long history of shortness of breath, but in the last few weeks he has significantly worsened. Patient reports significant dyspnea on exertion and  severely diminished exercise tolerance. Family reports that he can only walk a few feet now without getting severely short of breath. He has not been experiencing any chest  pain with the symptoms.  Symptoms are likely multifactorial. Patient does have known coronary artery disease. Dyspnea on exertion might be in part be secondary to CAD, but patient is found to be anemic today. As far below his baseline. This is likely adding to his shortness of breath and dyspnea on exertion. He was recently started on Lasix, does have evidence of acute kidney injury. He is likely somewhat dry, and therefore the level of his anemia is likely worse than indicated by his labs.  Patient does have a colostomy secondary to total colectomy from ulcerative colitis. A stool sample taken from his colostomy was heme positive. CT scan of abdomen and pelvis was performed. There is no evidence of pathology with the small or large intestine. No inflammatory changes. Patient does have a right mid ureter calculus that is 3 mm in diameter. This explains the patient's right groin pain.  Patient will require hospitalization for further workup of the multiple medical problems listed above.    Orpah Greek, MD 10/22/14 [phone]

## 2014-10-22 NOTE — ED Notes (Signed)
Pt stated that he got up this morning and walked a few feet and felt dizzy. Pt stated that he was put on diuretics last week and has lost 8 lbs since last Thursday. Pt stated that his sugar was 300 yesterday evening and was fluctuating.

## 2014-10-22 NOTE — H&P (Signed)
[facility] History and Physical  [name] EZM:[id number] DOB: [birth year] DOA: 10/22/2014  Referring physician: Dr. Mariane Baumgarten PCP: [name], MD   Chief Complaint: Abdominal pain  HPI: [name] [name] is a 81 y.o. male with past medical history including diabetes, ulcerative colitis status post remote partial colectomy, atrial fibrillation and CAD with recent stents. Patient came to the hospital because of abdominal pain. Patient reported since he had his stents in September 2015 he was having shortness of breath, patient was taken per Vaughan Basta and he was asked to stop taking it and switched to Plavix. Patient also seen by his cardiologist Dr. Radford Pax about a week ago and his BNP was in the high side so prescribed low dose of Lasix but he did not feel better with that. In the ED CT scan of abdomen/pelvis was done and showed small 3 mm stone which probably causing the abdominal pain which resolved by now. Patient has acute renal failure with creatinine of 2.27, his baseline about 1.0. Has acute anemia, his hemoglobin of 8.8 and last hemoglobin he had was 11.9, please note that he probably hemoconcentrated now with the dehydration from the Lasix. Fecal occult blood is positive but no overt bleeding, please note that patient has history of ulcerative colitis and remote colostomy. Patient seen with his sister at bedside.  Review of Systems:  Constitutional: negative for anorexia, fevers and sweats Eyes: negative for irritation, redness and visual disturbance Ears, nose, mouth, throat, and face: negative for earaches, epistaxis, nasal congestion and sore throat Respiratory: negative for cough, dyspnea on exertion, sputum and wheezing Cardiovascular: negative for chest pain, dyspnea, lower extremity edema, orthopnea, palpitations and syncope Gastrointestinal: negative for abdominal pain, constipation, diarrhea, melena, nausea and vomiting Genitourinary:negative for dysuria, frequency and  hematuria Hematologic/lymphatic: negative for bleeding, easy bruising and lymphadenopathy Musculoskeletal:negative for arthralgias, muscle weakness and stiff joints Neurological: negative for coordination problems, gait problems, headaches and weakness Endocrine: negative for diabetic symptoms including polydipsia, polyuria and weight loss Allergic/Immunologic: negative for anaphylaxis, hay fever and urticaria  Past Medical History  Diagnosis Date  . Diabetes   . Obesity   . Ulcerative colitis   . HTN (hypertension)   . Dyslipidemia   . GERD (gastroesophageal reflux disease)   . History of peptic ulcer disease   . Ulcerative colitis     s/p total colectomy  . Anemia   . Macular degeneration   . Kidney stone may 2009    Right hydronephrosis, S/P stone removal  . Small bowel obstruction, partial 2009    Episode  . Shingles     episode  . Compression fracture of L1 lumbar vertebra   . Osteopenia   . Lower back pain   . OA (osteoarthritis) of hip   . CKD (chronic kidney disease), stage III   . Hx of acute renal failure 01/02/10-3/24-11    due to hypovolemic shock, gastroenteritis and dehydration,hospitalized . Did requre a few days of dialysis. Cr at discharge was 1.8  . A-fib     s/p ablation, NSR  . Asymptomatic PVCs   . Complication of anesthesia     " during my kidney stone surgery my heart went out of rtyhm  . CAD (coronary artery disease) 06/2014    Occluded LAD with faint collaterals, 90% ramus s/p DES   Past Surgical History  Procedure Laterality Date  . Back surgery    . Colon resection    . Hernia repair    . Tonsillectomy    .  Cardiac catheterization  06/19/2014  . Coronary angioplasty    . Coronary stent placement  06/19/2014    DES       dr Martinique  . Left and right heart catheterization with coronary angiogram N/A 06/19/2014    Procedure: LEFT AND RIGHT HEART CATHETERIZATION WITH CORONARY ANGIOGRAM;  Surgeon: Peter M Martinique, MD;  Location: [facility] CATH LAB;   Service: Cardiovascular;  Laterality: N/A;   Social History:   reports that he has never smoked. He has never used smokeless tobacco. He reports that he does not drink alcohol or use illicit drugs.  Allergies  Allergen Reactions  . Penicillins Rash  . Sulfa Antibiotics Rash    Family History  Problem Relation Age of Onset  . Colon cancer Mother      Prior to Admission medications   Medication Sig Start Date End Date Taking? Authorizing Provider  amLODipine (NORVASC) 5 MG tablet Take 1 tablet (5 mg total) by mouth daily. 05/09/14  Yes Sueanne Margarita, MD  aspirin 81 MG tablet Take 81 mg by mouth daily.   Yes Historical Provider, MD  calcium carbonate (OS-CAL) 600 MG TABS tablet Take 600 mg by mouth daily with breakfast.   Yes Historical Provider, MD  clopidogrel (PLAVIX) 75 MG tablet Take 1 tablet (75 mg total) by mouth daily. 10/14/14  Yes Sueanne Margarita, MD  furosemide (LASIX) 20 MG tablet Take 1 tablet (20 mg total) by mouth daily. Take 40 mg your first two days. 10/15/14  Yes Sueanne Margarita, MD  glyBURIDE-metformin (GLUCOVANCE) 5-500 MG per tablet Take 1 tablet by mouth 2 (two) times daily with a meal. Hold for 48 hours, restart on 06/22/2014 06/20/14  Yes Rhonda G Barrett, PA-C  metoprolol (LOPRESSOR) 50 MG tablet Take 1.5 tablets (75 mg total) by mouth 2 (two) times daily. 05/06/14  Yes Sueanne Margarita, MD  pioglitazone (ACTOS) 30 MG tablet Take 30 mg by mouth daily.   Yes Historical Provider, MD  ranitidine (ZANTAC) 75 MG tablet Take 150 mg by mouth 2 (two) times daily. Patient unsure of exact dosage   Yes Historical Provider, MD  saxagliptin HCl (ONGLYZA) 5 MG TABS tablet Take 1 tablet (5 mg total) by mouth daily. 06/20/14  Yes Tanda Rockers, MD  simvastatin (ZOCOR) 20 MG tablet Take 20 mg by mouth daily.   Yes Historical Provider, MD   Physical Exam: Filed Vitals:   10/22/14 1600  BP: 122/89  Pulse: 63  Temp:   Resp: 14   Constitutional: Oriented to person, place, and time.  Well-developed and well-nourished. Cooperative.  Head: Normocephalic and atraumatic.  Nose: Nose normal.  Mouth/Throat: Uvula is midline, oropharynx is clear and moist and mucous membranes are normal.  Eyes: Conjunctivae and EOM are normal. Pupils are equal, round, and reactive to light.  Neck: Trachea normal and normal range of motion. Neck supple.  Cardiovascular: Normal rate, regular rhythm, S1 normal, S2 normal, normal heart sounds and intact distal pulses.   Pulmonary/Chest: Effort normal and breath sounds normal.  Abdominal: Soft. Bowel sounds are normal. There is no hepatosplenomegaly. There is no tenderness.  Musculoskeletal: Normal range of motion.  Neurological: Alert and oriented to person, place, and time. Has normal strength. No cranial nerve deficit or sensory deficit.  Skin: Skin is warm, dry and intact.  Psychiatric: Has a normal mood and affect. Speech is normal and behavior is normal.   Labs on Admission:  Basic Metabolic Panel:  Recent Labs Lab 10/22/14 1029  NA  136  K 5.0  CL 106  CO2 19  GLUCOSE 165*  BUN 46*  CREATININE 2.27*  CALCIUM 9.0   Liver Function Tests:  Recent Labs Lab 10/22/14 1029  AST 21  ALT 9  ALKPHOS 133*  BILITOT 0.7  PROT 7.2  ALBUMIN 3.2*   No results for input(s): LIPASE, AMYLASE in the last 168 hours. No results for input(s): AMMONIA in the last 168 hours. CBC:  Recent Labs Lab 10/22/14 1029  WBC 12.3*  NEUTROABS 9.9*  HGB 8.8*  HCT 29.0*  MCV 79.9  PLT 414*   Cardiac Enzymes:  Recent Labs Lab 10/22/14 1029  TROPONINI <0.03    BNP (last 3 results)  Recent Labs  06/17/14 1237 10/14/14 1111  PROBNP 315.0* 459.0*   CBG:  Recent Labs Lab 10/22/14 1014  GLUCAP 152*    Radiological Exams on Admission: Ct Abdomen Pelvis Wo Contrast  10/22/2014   CLINICAL DATA:  Acute abdominal pain. Dizziness and hyperglycemia. History of ulcerative colitis, small-bowel obstruction, peptic ulcer disease, kidney stone,  and chronic kidney disease.  EXAM: CT ABDOMEN AND PELVIS WITHOUT CONTRAST  TECHNIQUE: Multidetector CT imaging of the abdomen and pelvis was performed following the standard protocol without IV contrast.  COMPARISON:  06/01/2010  FINDINGS: Subsegmental atelectasis is present in the visualized lung bases. Coronary artery calcification is partially visualized.  The liver, gallbladder, spleen, and adrenal glands have an unremarkable unenhanced appearance. There is fatty infiltration of the pancreas. There is a 3 mm calculus in the proximal right ureter with mild right hydroureteronephrosis and perinephric stranding. Both kidneys demonstrate atrophy. A nonobstructing stone is again seen in the lower pole of the left kidney measuring 6 mm, slightly larger than on the prior study. No left ureteral stones are seen.  Bladder is grossly unremarkable. Sequelae of prior colectomy are again identified with a right lower quadrant ileostomy remaining in place. Oral contrast is present in loops of nondilated small bowel to the level of the ileostomy without evidence of obstruction. A fat containing ventral abdominal hernia is again seen to the right of midline medial to the ileostomy. Moderate atherosclerotic vascular calcification is noted. No free fluid or enlarged lymph nodes are identified. Chronic L1 compression fracture is noted status post vertebral augmentation.  IMPRESSION: 1. 3 mm proximal right ureteral stone with mild right hydroureteronephrosis. 2. Nonobstructing left nephrolithiasis.   Electronically Signed   By: Logan Bores   On: 10/22/2014 14:43   Dg Chest 2 View  10/22/2014   CLINICAL DATA:  Chronic shortness of breath.  EXAM: CHEST  2 VIEW  COMPARISON:  CT 06/03/2014.  Chest x-ray 01/05/2014.  FINDINGS: Bilateral of the lines noted in good anatomic position. Cardiomegaly with normal pulmonary vascularity. Mild bibasilar subsegmental atelectasis and/or scarring. No pleural effusion or pneumothorax. Stable  apical pleural thickening consistent scarring. No acute bony abnormality. Stable prominent compression fracture upper lumbar vertebral body. Prior vertebroplasty .  IMPRESSION: 1. Stable cardiomegaly, no CHF. 2. Mild bibasilar subsegmental atelectasis and/or scar.   Electronically Signed   By: Marcello Moores  Register   On: 10/22/2014 12:01    EKG: Independently reviewed.   Assessment/Plan Principal Problem:   Symptomatic anemia Active Problems:   Diabetes   Ulcerative colitis   A-fib   SOB (shortness of breath)   CAD in native artery- total LAD, s/p RI DES 06/19/14   Dyslipidemia   Abdominal pain, generalized    Abdominal pain According to the patient this is resolved by now, pain started last  night and resolved after patient came in the hospital. CT scan of abdomen and pelvis showed a 3 mm ureteric stone probably causing the pain. Patient also has mild right-sided hydronephrosis, this is need follow-up.  Acute renal failure Baseline creatinine is 1.0, presented with creatinine of 2.27 likely secondary to dehydration from the Lasix. Started patient on very gentle IV fluid hydration. Check BMP in a.m.   Anemia Baseline creatinine is 11.9 from September 2015, presented with hemoglobin of 8.8. Please note that true hemoglobin values might be lower because of hemoconcentration. We'll transfuse 1 unit of packed RBCs. Check CBC in a.m. FOBT is positive, without overt bleeding, we'll continue to monitor patient. If the patient hemoglobin continued to drop we might need to consider GI consultation.  Diabetes mellitus type 2 Carbohydrate modified diet, insulin sliding scale.  CAD with history of stent placed in September 2015 Patient used to be on aspirin and Brilinta,  Switched to aspirin and Plavix. Cannot stop antiplatelets because of the positive FOBT, please note that patient has fresh a stent from about 4 months ago.  Ulcerative colitis Has remote partial colectomy with colostomy in  place (about 30 years ago) FOBT was positive in the ED, but no overt bleeding, the stools are not dark. We'll hold on any gastrointestinal evaluation for now, transfuse blood and monitor the patient. If anemia continues consider GI evaluation. Per patient abdominal pain resolved and he think this is has nothing to do with his UC.   Code Status: Full code Family Communication: Plan discussed with the patient in the presence of his sister at bedside. Disposition Plan: Inpatient  Time spent: 70 minutes  [facility] Pager [phone]

## 2014-10-22 NOTE — ED Notes (Signed)
I gave the patient a happy meal and a diet coke.

## 2014-10-22 NOTE — ED Notes (Signed)
Contacted bed control regarding delay in bed assignment. Pt and family updated.

## 2014-10-22 NOTE — ED Notes (Signed)
Attempted to call report to 5W and 5W RN stated that she would have to call me back.

## 2014-10-22 NOTE — Progress Notes (Signed)
Admitted to room 5w14 from ED, ambulated from stretcher to bed- gait slightly unsteady, oriented to room and surroundings, sister at bedside

## 2014-10-22 NOTE — ED Notes (Signed)
Pt returned from CT °

## 2014-10-22 NOTE — ED Notes (Signed)
Admitting MD at bedside.

## 2014-10-22 NOTE — ED Notes (Signed)
CBG is 152. Notified nurse Raquel Sarna.

## 2014-10-23 ENCOUNTER — Other Ambulatory Visit: Payer: Medicare Other

## 2014-10-23 DIAGNOSIS — N132 Hydronephrosis with renal and ureteral calculous obstruction: Secondary | ICD-10-CM

## 2014-10-23 DIAGNOSIS — I059 Rheumatic mitral valve disease, unspecified: Secondary | ICD-10-CM

## 2014-10-23 DIAGNOSIS — N179 Acute kidney failure, unspecified: Secondary | ICD-10-CM

## 2014-10-23 LAB — IRON AND TIBC
Iron: 15 ug/dL — ABNORMAL LOW (ref 42–165)
Saturation Ratios: 4 % — ABNORMAL LOW (ref 20–55)
TIBC: 384 ug/dL (ref 215–435)
UIBC: 369 ug/dL (ref 125–400)

## 2014-10-23 LAB — BASIC METABOLIC PANEL
Anion gap: 8 (ref 5–15)
BUN: 47 mg/dL — ABNORMAL HIGH (ref 6–23)
CO2: 20 mmol/L (ref 19–32)
Calcium: 8.5 mg/dL (ref 8.4–10.5)
Chloride: 108 mEq/L (ref 96–112)
Creatinine, Ser: 2.56 mg/dL — ABNORMAL HIGH (ref 0.50–1.35)
GFR calc Af Amer: 26 mL/min — ABNORMAL LOW (ref >90)
GFR calc non Af Amer: 22 mL/min — ABNORMAL LOW (ref >90)
Glucose, Bld: 126 mg/dL — ABNORMAL HIGH (ref 70–99)
Potassium: 4.7 mmol/L (ref 3.5–5.1)
Sodium: 136 mmol/L (ref 135–145)

## 2014-10-23 LAB — CBC
HCT: 26 % — ABNORMAL LOW (ref 39.0–52.0)
HCT: 26.5 % — ABNORMAL LOW (ref 39.0–52.0)
Hemoglobin: 8.1 g/dL — ABNORMAL LOW (ref 13.0–17.0)
Hemoglobin: 8.3 g/dL — ABNORMAL LOW (ref 13.0–17.0)
MCH: 25.1 pg — ABNORMAL LOW (ref 26.0–34.0)
MCH: 25.2 pg — ABNORMAL LOW (ref 26.0–34.0)
MCHC: 31.2 g/dL (ref 30.0–36.0)
MCHC: 31.3 g/dL (ref 30.0–36.0)
MCV: 80.5 fL (ref 78.0–100.0)
MCV: 80.5 fL (ref 78.0–100.0)
Platelets: 293 10*3/uL (ref 150–400)
Platelets: 336 10*3/uL (ref 150–400)
RBC: 3.23 MIL/uL — ABNORMAL LOW (ref 4.22–5.81)
RBC: 3.29 MIL/uL — ABNORMAL LOW (ref 4.22–5.81)
RDW: 14.7 % (ref 11.5–15.5)
RDW: 14.9 % (ref 11.5–15.5)
WBC: 8.7 10*3/uL (ref 4.0–10.5)
WBC: 9.1 10*3/uL (ref 4.0–10.5)

## 2014-10-23 LAB — TSH: TSH: 0.71 u[IU]/mL (ref 0.350–4.500)

## 2014-10-23 LAB — URINE CULTURE: Colony Count: 7000

## 2014-10-23 LAB — FOLATE: Folate: 9.5 ng/mL

## 2014-10-23 LAB — HEMOGLOBIN A1C
Hgb A1c MFr Bld: 6.7 % — ABNORMAL HIGH (ref <5.7)
Mean Plasma Glucose: 146 mg/dL — ABNORMAL HIGH (ref <117)

## 2014-10-23 LAB — GLUCOSE, CAPILLARY
Glucose-Capillary: 115 mg/dL — ABNORMAL HIGH (ref 70–99)
Glucose-Capillary: 141 mg/dL — ABNORMAL HIGH (ref 70–99)
Glucose-Capillary: 120 mg/dL — ABNORMAL HIGH (ref 70–99)

## 2014-10-23 LAB — PROTIME-INR
INR: 1.29 (ref 0.00–1.49)
Prothrombin Time: 16.2 seconds — ABNORMAL HIGH (ref 11.6–15.2)

## 2014-10-23 LAB — FERRITIN: Ferritin: 12 ng/mL — ABNORMAL LOW (ref 22–322)

## 2014-10-23 LAB — VITAMIN B12: Vitamin B-12: 265 pg/mL (ref 211–911)

## 2014-10-23 MED ORDER — SODIUM CHLORIDE 0.9 % IV BOLUS (SEPSIS)
500.0000 mL | Freq: Once | INTRAVENOUS | Status: AC
  Administered 2014-10-23: 500 mL via INTRAVENOUS

## 2014-10-23 MED ORDER — TAMSULOSIN HCL 0.4 MG PO CAPS
0.4000 mg | ORAL_CAPSULE | Freq: Every day | ORAL | Status: DC
  Administered 2014-10-23 – 2014-10-26 (×4): 0.4 mg via ORAL
  Filled 2014-10-23 (×5): qty 1

## 2014-10-23 MED ORDER — SODIUM CHLORIDE 0.9 % IV BOLUS (SEPSIS)
1000.0000 mL | Freq: Once | INTRAVENOUS | Status: AC
  Administered 2014-10-23: 1000 mL via INTRAVENOUS

## 2014-10-23 MED ORDER — METOPROLOL TARTRATE 12.5 MG HALF TABLET
12.5000 mg | ORAL_TABLET | Freq: Two times a day (BID) | ORAL | Status: DC
  Administered 2014-10-24 – 2014-10-27 (×6): 12.5 mg via ORAL
  Filled 2014-10-23 (×9): qty 1

## 2014-10-23 NOTE — Progress Notes (Signed)
Utilization Review Completed.Donne Anon T1/04/2015

## 2014-10-23 NOTE — Progress Notes (Signed)
Pt BP 94/30, pt received 500cc bolus of normal saline. Paged MD awaiting call back, will make day RN aware. Will continue to monitor.

## 2014-10-23 NOTE — Progress Notes (Signed)
  Echocardiogram 2D Echocardiogram has been performed.  [name] [name] 10/23/2014, 11:25 AM

## 2014-10-23 NOTE — Progress Notes (Signed)
Pt bp 92/57, and pt states he feels dizzy when standing. Pt received 1 unit of blood, hemoglobin 8.1. MD made aware. MD gave orders for bolus of 500cc of Normal Saline. Will continue to monitor pt.

## 2014-10-23 NOTE — Consult Note (Signed)
Urology Consult   Physician requesting consult: Dr. Erlinda Hong, [place]. [phone]  Reason for consult: nephrolithiasis  History of Present Illness: [name] [name] is a 81 y.o. patient of Dr Alinda Money who was admitted earlier with symptomatic anemia/dyspnea on exertion and right abdominal pain, who had a CT scan which revealed a 12m R mid ureteral stone with proximal mild hydronephrosis. He has had ureteroscopy on the right in the past with Dr. BAlinda Moneyin 2012. He has a history of uric acid stones. He was placed on UroCitK, and last saw Dr. BAlinda Moneyin 2012. He subsequently stopped this medication in 2012 for reasons he can't remember. He thinks he was supposed to follow up with Dr. BAlinda MoneyPRN.  He states that this health episode started a few months ago with worsening dyspnea on exertion. This led to a heart catherization and placement of stents and initiation of antiplatelet therapy. This did not improve the symptoms. About a week ago, he was prescribed lasix, and promptly lost 8 lbs. He had mild nausea but no vomiting. No high ostomy output. His DOE persisted, and he finally came to the ED for evaluation.  As for his R abdominal pain, this lasted only about 12-24 hrs. It was not associated with fevers or vomiting. It has not recurred. He has not been straining his urine. He has not seen the stone pass.    He denies a history of voiding or storage urinary symptoms, gross hematuria, UTIs, STDs, GU malignancy/trauma/surgery.  Past Medical History  Diagnosis Date  . Diabetes   . Obesity   . Ulcerative colitis   . HTN (hypertension)   . Dyslipidemia   . GERD (gastroesophageal reflux disease)   . History of peptic ulcer disease   . Ulcerative colitis     s/p total colectomy  . Anemia   . Macular degeneration   . Kidney stone may 2009    Right hydronephrosis, S/P stone removal  . Small bowel obstruction, partial 2009    Episode  . Shingles     episode  . Compression fracture of L1 lumbar  vertebra   . Osteopenia   . Lower back pain   . OA (osteoarthritis) of hip   . CKD (chronic kidney disease), stage III   . Hx of acute renal failure 01/02/10-3/24-11    due to hypovolemic shock, gastroenteritis and dehydration,hospitalized . Did requre a few days of dialysis. Cr at discharge was 1.8  . A-fib     s/p ablation, NSR  . Asymptomatic PVCs   . CAD (coronary artery disease) 06/2014    Occluded LAD with faint collaterals, 90% ramus s/p DES  . Complication of anesthesia     " during my kidney stone surgery my heart went out of rhythm  . History of blood transfusion ? date; 10/22/2014    "from my ulcerative colitis; don't know where it's coming from today" (10/22/2014)    Past Surgical History  Procedure Laterality Date  . Back surgery    . Colon resection    . Hernia repair    . Tonsillectomy    . Cardiac catheterization  06/19/2014  . Coronary angioplasty    . Coronary stent placement  06/19/2014    DES       dr jMartinique . Left and right heart catheterization with coronary angiogram N/A 06/19/2014    Procedure: LEFT AND RIGHT HEART CATHETERIZATION WITH CORONARY ANGIOGRAM;  Surgeon: Peter M JMartinique MD;  [facility];  Service: Cardiovascular;  Laterality:  N/A;    Current Hospital Medications:  Home Meds:    Medication List    ASK your doctor about these medications        amLODipine 5 MG tablet  Commonly known as:  NORVASC  Take 1 tablet (5 mg total) by mouth daily.     aspirin 81 MG tablet  Take 81 mg by mouth daily.     calcium carbonate 600 MG Tabs tablet  Commonly known as:  OS-CAL  Take 600 mg by mouth daily with breakfast.     clopidogrel 75 MG tablet  Commonly known as:  PLAVIX  Take 1 tablet (75 mg total) by mouth daily.     furosemide 20 MG tablet  Commonly known as:  LASIX  Take 1 tablet (20 mg total) by mouth daily. Take 40 mg your first two days.     glyBURIDE-metformin 5-500 MG per tablet  Commonly known as:  GLUCOVANCE  Take 1 tablet  by mouth 2 (two) times daily with a meal. Hold for 48 hours, restart on 06/22/2014     metoprolol 50 MG tablet  Commonly known as:  LOPRESSOR  Take 1.5 tablets (75 mg total) by mouth 2 (two) times daily.     pioglitazone 30 MG tablet  Commonly known as:  ACTOS  Take 30 mg by mouth daily.     ranitidine 75 MG tablet  Commonly known as:  ZANTAC  Take 150 mg by mouth 2 (two) times daily. Patient unsure of exact dosage     saxagliptin HCl 5 MG Tabs tablet  Commonly known as:  ONGLYZA  Take 1 tablet (5 mg total) by mouth daily.     simvastatin 20 MG tablet  Commonly known as:  ZOCOR  Take 20 mg by mouth daily.        Scheduled Meds: . aspirin  81 mg Oral Daily  . clopidogrel  75 mg Oral Daily  . insulin aspart  0-9 Units Subcutaneous TID WC  . linagliptin  5 mg Oral Daily  . metoprolol  12.5 mg Oral BID  . simvastatin  20 mg Oral q1800  . sodium chloride  3 mL Intravenous Q12H   Continuous Infusions: . sodium chloride 75 mL/hr at 10/23/14 0348   PRN Meds:.acetaminophen **OR** acetaminophen, HYDROcodone-acetaminophen, morphine injection, ondansetron **OR** ondansetron (ZOFRAN) IV  Allergies:  Allergies  Allergen Reactions  . Penicillins Rash  . Sulfa Antibiotics Rash    Family History  Problem Relation Age of Onset  . Colon cancer Mother     Social History:  reports that he has never smoked. He has never used smokeless tobacco. He reports that he does not drink alcohol or use illicit drugs.  ROS: A complete review of systems was performed.  All systems are negative except for pertinent findings as noted.  Physical Exam:  Vital signs in last 24 hours: Temp:  [97.4 F (36.3 C)-98.4 F (36.9 C)] 97.8 F (36.6 C) (01/07 0518) Pulse Rate:  [49-71] 56 (01/07 1332) Resp:  [16-20] 16 (01/07 1332) BP: (70-134)/(30-98) 108/49 mmHg (01/07 1332) SpO2:  [95 %-100 %] 97 % (01/07 1332) Weight:  [111.358 kg (245 lb 8 oz)] 111.358 kg (245 lb 8 oz) (01/07  0700) Constitutional:  Alert and oriented, No acute distress. Obese. Cardiovascular: Regular rate and rhythm, No JVD Respiratory: Normal respiratory effort, Lungs clear bilaterally GI: Abdomen is soft, nontender, nondistended, no abdominal masses GU: No CVA tenderness Lymphatic: No lymphadenopathy Neurologic: Grossly intact, no focal deficits Psychiatric: Normal mood  and affect  Laboratory Data:   Recent Labs  10/22/14 1029 10/23/14 0028  WBC 12.3* 9.1  8.7  HGB 8.8* 8.1*  8.3*  HCT 29.0* 26.0*  26.5*  PLT 414* 336  293     Recent Labs  10/22/14 1029 10/23/14 0028  NA 136 136  K 5.0 4.7  CL 106 108  GLUCOSE 165* 126*  BUN 46* 47*  CALCIUM 9.0 8.5  CREATININE 2.27* 2.56*     Results for orders placed or performed during the hospital encounter of 10/22/14 (from the past 24 hour(s))  Glucose, capillary     Status: Abnormal   Collection Time: 10/22/14  6:12 PM  Result Value Ref Range   Glucose-Capillary 185 (H) 70 - 99 mg/dL  Prepare RBC     Status: None   Collection Time: 10/22/14  6:30 PM  Result Value Ref Range   Order Confirmation ORDER PROCESSED BY BLOOD BANK   Protime-INR     Status: Abnormal   Collection Time: 10/23/14 12:28 AM  Result Value Ref Range   Prothrombin Time 16.2 (H) 11.6 - 15.2 seconds   INR 1.29 0.00 - 1.49  TSH     Status: None   Collection Time: 10/23/14 12:28 AM  Result Value Ref Range   TSH 0.710 0.350 - 4.500 uIU/mL  Basic metabolic panel     Status: Abnormal   Collection Time: 10/23/14 12:28 AM  Result Value Ref Range   Sodium 136 135 - 145 mmol/L   Potassium 4.7 3.5 - 5.1 mmol/L   Chloride 108 96 - 112 mEq/L   CO2 20 19 - 32 mmol/L   Glucose, Bld 126 (H) 70 - 99 mg/dL   BUN 47 (H) 6 - 23 mg/dL   Creatinine, Ser 2.56 (H) 0.50 - 1.35 mg/dL   Calcium 8.5 8.4 - 10.5 mg/dL   GFR calc non Af Amer 22 (L) >90 mL/min   GFR calc Af Amer 26 (L) >90 mL/min   Anion gap 8 5 - 15  CBC     Status: Abnormal   Collection Time: 10/23/14  12:28 AM  Result Value Ref Range   WBC 9.1 4.0 - 10.5 K/uL   RBC 3.23 (L) 4.22 - 5.81 MIL/uL   Hemoglobin 8.1 (L) 13.0 - 17.0 g/dL   HCT 26.0 (L) 39.0 - 52.0 %   MCV 80.5 78.0 - 100.0 fL   MCH 25.1 (L) 26.0 - 34.0 pg   MCHC 31.2 30.0 - 36.0 g/dL   RDW 14.7 11.5 - 15.5 %   Platelets 336 150 - 400 K/uL  CBC     Status: Abnormal   Collection Time: 10/23/14 12:28 AM  Result Value Ref Range   WBC 8.7 4.0 - 10.5 K/uL   RBC 3.29 (L) 4.22 - 5.81 MIL/uL   Hemoglobin 8.3 (L) 13.0 - 17.0 g/dL   HCT 26.5 (L) 39.0 - 52.0 %   MCV 80.5 78.0 - 100.0 fL   MCH 25.2 (L) 26.0 - 34.0 pg   MCHC 31.3 30.0 - 36.0 g/dL   RDW 14.9 11.5 - 15.5 %   Platelets 293 150 - 400 K/uL  Glucose, capillary     Status: Abnormal   Collection Time: 10/23/14  1:23 PM  Result Value Ref Range   Glucose-Capillary 115 (H) 70 - 99 mg/dL   Recent Results (from the past 240 hour(s))  Urine culture     Status: None   Collection Time: 10/22/14 10:52 AM  Result Value Ref Range  Status   Specimen Description URINE, CLEAN CATCH  Final   Special Requests NONE  Final   Colony Count   Final    7,000 COLONIES/ML Performed at [facility]    Culture   Final    INSIGNIFICANT GROWTH Performed at [facility]    Report Status 10/23/2014 FINAL  Final    Renal Function:  Recent Labs  10/22/14 1029 10/23/14 0028  CREATININE 2.27* 2.56*   Estimated Creatinine Clearance: 29.3 mL/min (by C-G formula based on Cr of 2.56).   U/A:+ blood. Neg nitrite/LE. UCx 1.6: "insignificant growth".  Radiologic Imaging: Ct Abdomen Pelvis Wo Contrast  10/22/2014   CLINICAL DATA:  Acute abdominal pain. Dizziness and hyperglycemia. History of ulcerative colitis, small-bowel obstruction, peptic ulcer disease, kidney stone, and chronic kidney disease.  EXAM: CT ABDOMEN AND PELVIS WITHOUT CONTRAST  TECHNIQUE: Multidetector CT imaging of the abdomen and pelvis was performed following the standard protocol without IV contrast.   COMPARISON:  06/01/2010  FINDINGS: Subsegmental atelectasis is present in the visualized lung bases. Coronary artery calcification is partially visualized.  The liver, gallbladder, spleen, and adrenal glands have an unremarkable unenhanced appearance. There is fatty infiltration of the pancreas. There is a 3 mm calculus in the proximal right ureter with mild right hydroureteronephrosis and perinephric stranding. Both kidneys demonstrate atrophy. A nonobstructing stone is again seen in the lower pole of the left kidney measuring 6 mm, slightly larger than on the prior study. No left ureteral stones are seen.  Bladder is grossly unremarkable. Sequelae of prior colectomy are again identified with a right lower quadrant ileostomy remaining in place. Oral contrast is present in loops of nondilated small bowel to the level of the ileostomy without evidence of obstruction. A fat containing ventral abdominal hernia is again seen to the right of midline medial to the ileostomy. Moderate atherosclerotic vascular calcification is noted. No free fluid or enlarged lymph nodes are identified. Chronic L1 compression fracture is noted status post vertebral augmentation.  IMPRESSION: 1. 3 mm proximal right ureteral stone with mild right hydroureteronephrosis. 2. Nonobstructing left nephrolithiasis.   Electronically Signed   By: Logan Bores   On: 10/22/2014 14:43   Dg Chest 2 View  10/22/2014   CLINICAL DATA:  Chronic shortness of breath.  EXAM: CHEST  2 VIEW  COMPARISON:  CT 06/03/2014.  Chest x-ray 01/05/2014.  FINDINGS: Bilateral of the lines noted in good anatomic position. Cardiomegaly with normal pulmonary vascularity. Mild bibasilar subsegmental atelectasis and/or scarring. No pleural effusion or pneumothorax. Stable apical pleural thickening consistent scarring. No acute bony abnormality. Stable prominent compression fracture upper lumbar vertebral body. Prior vertebroplasty .  IMPRESSION: 1. Stable cardiomegaly, no CHF.  2. Mild bibasilar subsegmental atelectasis and/or scar.   Electronically Signed   By: Marcello Moores  Register   On: 10/22/2014 12:01    I independently reviewed the above imaging studies.  Impression/Recommendation 81yM with multiple medical problems and history of uric acid nephrolithiasis requiring ureteroscopy with Dr Alinda Money, who is admitted with diffuse abdominal pain (now resolved), R 61m mid ureteral stone with proximal mild hydro, Cre to 2.5 from baseline of 1.5 (as well as anemia, FOBT positive stool in setting of UC s/p diverting colostomy). His kidneys appear bilaterally atrophic likely from medicorenal disease.  His AKI is likely a combination of obstruction on the right, and dehydration from diuretics.   He has a high liklihood of passing this stone with flomax 0.428mqday, hydration, and symptom control. Given his prior arrythymia  with anesthesia, would like to defer surgery if at all possible. If the medical team could please comment on his fitness for surgery, we would great appreciate it.  Given his CHF, and suspicion for volume overload, as well as borderline hyperkalemia, would defer any medical treatment with NaHCO3 or UroCitK for now.  Please continue to strain urine and call urology if stone is retrieved. If patient has sudden onset of fevers and right flank pain suggestive of obstructive pyelonephritis, please make NPO and page urology emergently.  Discussed with Dr. Karsten Ro  We will continue to follow.     Margo Aye 10/23/2014, 5:14 PM   Addendum: He has a very small stone that is not causing significant hydronephrosis. His probability of spontaneous passage is excellent and he is not having pain. He may eventually require ureteroscopic management if he develops intractable pain or does not have improvement in his creatinine. Otherwise pursuing a conservative course with medical expulsive therapy and observation is my current recommendation.   Cc: Raynelle Bring, MD

## 2014-10-23 NOTE — Progress Notes (Signed)
Rechecked pt bp manually 118/58, will continue to monitor and make day RN aware.

## 2014-10-23 NOTE — Progress Notes (Signed)
PROGRESS NOTE  [name] [name] ZES:[id number] DOB: 1933/05/20 DOA: 10/22/2014 PCP: [name], MD  HPI/Recap of past 24 hours: bp borderline low, denies chest pain, no dizziness, no ab pain  Assessment/Plan: Principal Problem:   Symptomatic anemia Active Problems:   Diabetes   Ulcerative colitis   A-fib   SOB (shortness of breath)   CAD in native artery- total LAD, s/p RI DES 06/19/14   Dyslipidemia   Abdominal pain, generalized  1. Symptomatic anemia with DOE/weakenss. Iron deficiency. Likely chronic GI and GU loss. S/p prbc transfusion, will check b12, provide iron supplement. plavix continued due to recent cardiac stent placement. (06/2014). 2. Hypotension: from dehydration? No sign of infection. IVF. Hold lasix. 3. ARF/right ureteral stone/mild right sided hydronephrosis: likely combination of prerenal and obstruction due to renal stone. On ivf. Urology consulted. flomax started. 4. H/o UC s/p colectomy/diverting colostomy. Stool occult blood positive but not overt bleeding. GI consulted. 5. FTT, PT/discharge planning  Code Status: full code  Family Communication: patient and patient sister  Disposition Plan: remain inpatient   Consultants:  GI/urology  Procedures:  none  Antibiotics:  none   Objective: BP 94/48 mmHg  Pulse 62  Temp(Src) 97.7 F (36.5 C) (Oral)  Resp 24  Ht 5' 10"  (1.778 m)  Wt 111.358 kg (245 lb 8 oz)  BMI 35.23 kg/m2  SpO2 95%  Intake/Output Summary (Last 24 hours) at 10/23/14 2240 Last data filed at 10/23/14 2200  Gross per 24 hour  Intake   3230 ml  Output   1900 ml  Net   1330 ml   Filed Weights   10/23/14 0700  Weight: 111.358 kg (245 lb 8 oz)    Exam:   General:  Frail, nad  Cardiovascular: RRR  Respiratory: CTABL  Abdomen: well healed midline surgical scar. Diverting colostomy with brown color fecal matters.  Musculoskeletal: no edema  Data Reviewed: Basic Metabolic Panel:  Recent Labs Lab 10/22/14 1029  10/23/14 0028  NA 136 136  K 5.0 4.7  CL 106 108  CO2 19 20  GLUCOSE 165* 126*  BUN 46* 47*  CREATININE 2.27* 2.56*  CALCIUM 9.0 8.5   Liver Function Tests:  Recent Labs Lab 10/22/14 1029  AST 21  ALT 9  ALKPHOS 133*  BILITOT 0.7  PROT 7.2  ALBUMIN 3.2*   No results for input(s): LIPASE, AMYLASE in the last 168 hours. No results for input(s): AMMONIA in the last 168 hours. CBC:  Recent Labs Lab 10/22/14 1029 10/23/14 0028  WBC 12.3* 9.1  8.7  NEUTROABS 9.9*  --   HGB 8.8* 8.1*  8.3*  HCT 29.0* 26.0*  26.5*  MCV 79.9 80.5  80.5  PLT 414* 336  293   Cardiac Enzymes:    Recent Labs Lab 10/22/14 1029  TROPONINI <0.03   BNP (last 3 results)  Recent Labs  06/17/14 1237 10/14/14 1111  PROBNP 315.0* 459.0*   CBG:  Recent Labs Lab 10/22/14 1014 10/22/14 1812 10/23/14 1323 10/23/14 1759 10/23/14 2149  GLUCAP 152* 185* 115* 141* 120*    Recent Results (from the past 240 hour(s))  Urine culture     Status: None   Collection Time: 10/22/14 10:52 AM  Result Value Ref Range Status   Specimen Description URINE, CLEAN CATCH  Final   Special Requests NONE  Final   Colony Count   Final    7,000 COLONIES/ML Performed at [facility]    Culture   Final    INSIGNIFICANT GROWTH  Performed at [facility]    Report Status 10/23/2014 FINAL  Final     Studies: Ct Abdomen Pelvis Wo Contrast  10/22/2014   CLINICAL DATA:  Acute abdominal pain. Dizziness and hyperglycemia. History of ulcerative colitis, small-bowel obstruction, peptic ulcer disease, kidney stone, and chronic kidney disease.  EXAM: CT ABDOMEN AND PELVIS WITHOUT CONTRAST  TECHNIQUE: Multidetector CT imaging of the abdomen and pelvis was performed following the standard protocol without IV contrast.  COMPARISON:  06/01/2010  FINDINGS: Subsegmental atelectasis is present in the visualized lung bases. Coronary artery calcification is partially visualized.  The liver, gallbladder,  spleen, and adrenal glands have an unremarkable unenhanced appearance. There is fatty infiltration of the pancreas. There is a 3 mm calculus in the proximal right ureter with mild right hydroureteronephrosis and perinephric stranding. Both kidneys demonstrate atrophy. A nonobstructing stone is again seen in the lower pole of the left kidney measuring 6 mm, slightly larger than on the prior study. No left ureteral stones are seen.  Bladder is grossly unremarkable. Sequelae of prior colectomy are again identified with a right lower quadrant ileostomy remaining in place. Oral contrast is present in loops of nondilated small bowel to the level of the ileostomy without evidence of obstruction. A fat containing ventral abdominal hernia is again seen to the right of midline medial to the ileostomy. Moderate atherosclerotic vascular calcification is noted. No free fluid or enlarged lymph nodes are identified. Chronic L1 compression fracture is noted status post vertebral augmentation.  IMPRESSION: 1. 3 mm proximal right ureteral stone with mild right hydroureteronephrosis. 2. Nonobstructing left nephrolithiasis.   Electronically Signed   By: Logan Bores   On: 10/22/2014 14:43   Dg Chest 2 View  10/22/2014   CLINICAL DATA:  Chronic shortness of breath.  EXAM: CHEST  2 VIEW  COMPARISON:  CT 06/03/2014.  Chest x-ray 01/05/2014.  FINDINGS: Bilateral of the lines noted in good anatomic position. Cardiomegaly with normal pulmonary vascularity. Mild bibasilar subsegmental atelectasis and/or scarring. No pleural effusion or pneumothorax. Stable apical pleural thickening consistent scarring. No acute bony abnormality. Stable prominent compression fracture upper lumbar vertebral body. Prior vertebroplasty .  IMPRESSION: 1. Stable cardiomegaly, no CHF. 2. Mild bibasilar subsegmental atelectasis and/or scar.   Electronically Signed   By: [place]   On: 10/22/2014 12:01    Scheduled Meds: . aspirin  81 mg Oral Daily  .  clopidogrel  75 mg Oral Daily  . insulin aspart  0-9 Units Subcutaneous TID WC  . linagliptin  5 mg Oral Daily  . metoprolol  12.5 mg Oral BID  . simvastatin  20 mg Oral q1800  . sodium chloride  3 mL Intravenous Q12H  . tamsulosin  0.4 mg Oral QPC supper    Continuous Infusions: . sodium chloride 75 mL/hr at 10/23/14 2211       Mlissa Tamayo  [facility] Pager [phone]. If 7PM-7AM, please contact night-coverage at [email or web address], password [facility] 10/23/2014, 10:40 PM  LOS: 1 day

## 2014-10-24 DIAGNOSIS — N179 Acute kidney failure, unspecified: Secondary | ICD-10-CM

## 2014-10-24 DIAGNOSIS — R195 Other fecal abnormalities: Secondary | ICD-10-CM

## 2014-10-24 DIAGNOSIS — I959 Hypotension, unspecified: Secondary | ICD-10-CM

## 2014-10-24 DIAGNOSIS — N201 Calculus of ureter: Secondary | ICD-10-CM

## 2014-10-24 DIAGNOSIS — N1330 Unspecified hydronephrosis: Secondary | ICD-10-CM

## 2014-10-24 DIAGNOSIS — D509 Iron deficiency anemia, unspecified: Principal | ICD-10-CM

## 2014-10-24 LAB — CBC
HCT: 25.2 % — ABNORMAL LOW (ref 39.0–52.0)
Hemoglobin: 7.6 g/dL — ABNORMAL LOW (ref 13.0–17.0)
MCH: 24.1 pg — ABNORMAL LOW (ref 26.0–34.0)
MCHC: 30.2 g/dL (ref 30.0–36.0)
MCV: 80 fL (ref 78.0–100.0)
Platelets: 288 10*3/uL (ref 150–400)
RBC: 3.15 MIL/uL — ABNORMAL LOW (ref 4.22–5.81)
RDW: 15.2 % (ref 11.5–15.5)
WBC: 7.9 10*3/uL (ref 4.0–10.5)

## 2014-10-24 LAB — GLUCOSE, CAPILLARY
Glucose-Capillary: 136 mg/dL — ABNORMAL HIGH (ref 70–99)
Glucose-Capillary: 149 mg/dL — ABNORMAL HIGH (ref 70–99)
Glucose-Capillary: 101 mg/dL — ABNORMAL HIGH (ref 70–99)
Glucose-Capillary: 169 mg/dL — ABNORMAL HIGH (ref 70–99)

## 2014-10-24 LAB — BASIC METABOLIC PANEL
Anion gap: 4 — ABNORMAL LOW (ref 5–15)
BUN: 30 mg/dL — ABNORMAL HIGH (ref 6–23)
CO2: 20 mmol/L (ref 19–32)
Calcium: 7.9 mg/dL — ABNORMAL LOW (ref 8.4–10.5)
Chloride: 111 mEq/L (ref 96–112)
Creatinine, Ser: 1.44 mg/dL — ABNORMAL HIGH (ref 0.50–1.35)
GFR calc Af Amer: 52 mL/min — ABNORMAL LOW (ref >90)
GFR calc non Af Amer: 45 mL/min — ABNORMAL LOW (ref >90)
Glucose, Bld: 146 mg/dL — ABNORMAL HIGH (ref 70–99)
Potassium: 4.5 mmol/L (ref 3.5–5.1)
Sodium: 135 mmol/L (ref 135–145)

## 2014-10-24 LAB — LACTIC ACID, PLASMA: Lactic Acid, Venous: 1.7 mmol/L (ref 0.5–2.2)

## 2014-10-24 LAB — TSH: TSH: 1.419 u[IU]/mL (ref 0.350–4.500)

## 2014-10-24 LAB — VITAMIN B12: Vitamin B-12: 216 pg/mL (ref 211–911)

## 2014-10-24 LAB — PREPARE RBC (CROSSMATCH)

## 2014-10-24 MED ORDER — FERROUS SULFATE 325 (65 FE) MG PO TABS
325.0000 mg | ORAL_TABLET | Freq: Every day | ORAL | Status: DC
  Filled 2014-10-24: qty 1

## 2014-10-24 MED ORDER — PANTOPRAZOLE SODIUM 40 MG PO TBEC
40.0000 mg | DELAYED_RELEASE_TABLET | Freq: Two times a day (BID) | ORAL | Status: DC
  Administered 2014-10-24 – 2014-10-27 (×6): 40 mg via ORAL
  Filled 2014-10-24 (×5): qty 1

## 2014-10-24 MED ORDER — PANTOPRAZOLE SODIUM 40 MG PO TBEC: 40.0000 mg | DELAYED_RELEASE_TABLET | Freq: Every day | ORAL | Status: DC

## 2014-10-24 MED ORDER — FERROUS SULFATE 325 (65 FE) MG PO TABS
325.0000 mg | ORAL_TABLET | Freq: Two times a day (BID) | ORAL | Status: DC
Start: 2014-10-24 — End: 2014-10-27
  Administered 2014-10-24 – 2014-10-27 (×6): 325 mg via ORAL
  Filled 2014-10-24 (×8): qty 1

## 2014-10-24 MED ORDER — SODIUM CHLORIDE 0.9 % IV SOLN
Freq: Once | INTRAVENOUS | Status: AC
Start: 2014-10-24 — End: 2014-10-24
  Administered 2014-10-24: 10 mL/h via INTRAVENOUS

## 2014-10-24 NOTE — Care Management Note (Signed)
    Page 1 of 1   10/24/2014     3:58:00 PM CARE MANAGEMENT NOTE 10/24/2014  Patient:  DONYALE, BERTHOLD   Account Number:  [id number]  Date Initiated:  10/24/2014  Documentation initiated by:  Tomi Bamberger  Subjective/Objective Assessment:   dx symptomatic anemia,  admit- lives alone.     Action/Plan:   pt eval- rec hhpt   Anticipated DC Date:  10/25/2014   Anticipated DC Plan:  [place]  CM consult      Choice offered to / List presented to:          [facility] arranged  HH-1 RN  [place].   Status of service:  Completed, signed off Medicare Important Message given?  YES (If response is "NO", the following Medicare IM given date fields will be blank) Date Medicare IM given:  10/24/2014 Medicare IM given by:  Tomi Bamberger Date Additional Medicare IM given:   Additional Medicare IM given by:    Discharge Disposition:  [place]  Per UR Regulation:  Reviewed for med. necessity/level of care/duration of stay  If discussed at [place] of Stay Meetings, dates discussed:    Comments:  10/24/14 [place], BSN [phone] patient for dc  tomorrow, patient chose [facility] for hhpt and hhrn, referral made to [facility] , [place] notified.  Soc will begin 24-48 hrs post dc.

## 2014-10-24 NOTE — Progress Notes (Signed)
Subjective: Patient reports no flank pain. He passed a stone last night.  Objective: Vital signs in last 24 hours: Temp:  [97.7 F (36.5 C)-98.1 F (36.7 C)] 98.1 F (36.7 C) (01/08 0512) Pulse Rate:  [56-68] 68 (01/08 0512) Resp:  [16-24] 20 (01/08 0512) BP: (94-121)/(35-62) 102/46 mmHg (01/08 0512) SpO2:  [94 %-97 %] 94 % (01/08 0512)  Intake/Output from previous day: 01/07 0701 - 01/08 0700 In: 4091.3 [P.O.:1320; I.V.:1771.3; IV Piggyback:1000] Out: 2260 [Urine:2260] Intake/Output this shift:    Physical Exam:  He is awake, alert and oriented. No CVAT.  Lab Results:  Recent Labs  10/22/14 1029 10/23/14 0028 10/24/14 0525  HGB 8.8* 8.1*  8.3* 7.6*  HCT 29.0* 26.0*  26.5* 25.2*   BMET  Recent Labs  10/23/14 0028 10/24/14 0525  NA 136 135  K 4.7 4.5  CL 108 111  CO2 20 20  GLUCOSE 126* 146*  BUN 47* 30*  CREATININE 2.56* 1.44*  CALCIUM 8.5 7.9*    Recent Labs  10/22/14 1502 10/23/14 0028  INR 1.19 1.29   No results for input(s): LABURIN in the last 72 hours. Results for orders placed or performed during the hospital encounter of 10/22/14  Urine culture     Status: None   Collection Time: 10/22/14 10:52 AM  Result Value Ref Range Status   Specimen Description URINE, CLEAN CATCH  Final   Special Requests NONE  Final   Colony Count   Final    7,000 COLONIES/ML Performed at [facility]    Culture   Final    INSIGNIFICANT GROWTH Performed at [facility]    Report Status 10/23/2014 FINAL  Final    Studies/Results: Ct Abdomen Pelvis Wo Contrast  10/22/2014   CLINICAL DATA:  Acute abdominal pain. Dizziness and hyperglycemia. History of ulcerative colitis, small-bowel obstruction, peptic ulcer disease, kidney stone, and chronic kidney disease.  EXAM: CT ABDOMEN AND PELVIS WITHOUT CONTRAST  TECHNIQUE: Multidetector CT imaging of the abdomen and pelvis was performed following the standard protocol without IV contrast.   COMPARISON:  06/01/2010  FINDINGS: Subsegmental atelectasis is present in the visualized lung bases. Coronary artery calcification is partially visualized.  The liver, gallbladder, spleen, and adrenal glands have an unremarkable unenhanced appearance. There is fatty infiltration of the pancreas. There is a 3 mm calculus in the proximal right ureter with mild right hydroureteronephrosis and perinephric stranding. Both kidneys demonstrate atrophy. A nonobstructing stone is again seen in the lower pole of the left kidney measuring 6 mm, slightly larger than on the prior study. No left ureteral stones are seen.  Bladder is grossly unremarkable. Sequelae of prior colectomy are again identified with a right lower quadrant ileostomy remaining in place. Oral contrast is present in loops of nondilated small bowel to the level of the ileostomy without evidence of obstruction. A fat containing ventral abdominal hernia is again seen to the right of midline medial to the ileostomy. Moderate atherosclerotic vascular calcification is noted. No free fluid or enlarged lymph nodes are identified. Chronic L1 compression fracture is noted status post vertebral augmentation.  IMPRESSION: 1. 3 mm proximal right ureteral stone with mild right hydroureteronephrosis. 2. Nonobstructing left nephrolithiasis.   Electronically Signed   By: Logan Bores   On: 10/22/2014 14:43   Dg Chest 2 View  10/22/2014   CLINICAL DATA:  Chronic shortness of breath.  EXAM: CHEST  2 VIEW  COMPARISON:  CT 06/03/2014.  Chest x-ray 01/05/2014.  FINDINGS: Bilateral of the lines  noted in good anatomic position. Cardiomegaly with normal pulmonary vascularity. Mild bibasilar subsegmental atelectasis and/or scarring. No pleural effusion or pneumothorax. Stable apical pleural thickening consistent scarring. No acute bony abnormality. Stable prominent compression fracture upper lumbar vertebral body. Prior vertebroplasty .  IMPRESSION: 1. Stable cardiomegaly, no CHF.  2. Mild bibasilar subsegmental atelectasis and/or scar.   Electronically Signed   By: Marcello Moores  Register   On: 10/22/2014 12:01    Assessment/Plan: He passed his stone last night. His creatinine has improved and currently is 1.44.  Stone sent for [employer].  He will follow-up with Dr. Alinda Money as an outpatient.   LOS: 2 days   Brithney Bensen C 10/24/2014, 8:59 AM

## 2014-10-24 NOTE — Clinical Documentation Improvement (Signed)
Supporting Information: CHF and suspicion for volume overload per 01/08 progress notes.  Labs: 01/06: BNP: 384.1.    Possible Clinical Condition: . Document acuity --Acute --Chronic --Acute on Chronic . Document type --Diastolic --Systolic --Combined systolic and diastolic . Due to or associated with --Cardiac or other surgery --Hypertension --Valvular disease --Rheumatic heart disease Endocarditis (valvitis) Pericarditis Myocarditis --Other (specify)   Thank Sherian Maroon Documentation Specialist [phone] [email or web address]

## 2014-10-24 NOTE — Evaluation (Signed)
Physical Therapy Evaluation Patient Details Name: FURQAN GOSSELIN MRN: [id number] DOB: 11-20-1932 Today's Date: 10/24/2014   History of Present Illness   [name] [name] is a 81 y.o. male with past medical history including diabetes, ulcerative colitis status post remote partial colectomy, atrial fibrillation and CAD with recent stents. Patient came to the hospital because of abdominal pain. Patient reported since he had his stents in September 2015 he was having shortness of breath, patient was taken per Vaughan Basta and he was asked to stop taking it and switched to Plavix. Patient also seen by his cardiologist Dr. Radford Pax about a week ago and his BNP was in the high side so prescribed low dose of Lasix but he did not feel better with that.  Clinical Impression  Pt admitted with/for abdominal pain which was likely a kidney stone and SOB which is being treated as symptomatic anemia.  Pt currently limited functionally due to the problems listed below.  (see problems list.)  Pt will benefit from PT to maximize function and safety to be able to get home safely with available assist of family..     Follow Up Recommendations Home health PT (transition to OPPT to improve balance and gait.)    Equipment Recommendations  None recommended by PT    Recommendations for Other Services       Precautions / Restrictions Precautions Precautions: Fall Restrictions Weight Bearing Restrictions: No      Mobility  Bed Mobility Overal bed mobility: Needs Assistance Bed Mobility: Supine to Sit     Supine to sit: Min guard     General bed mobility comments: used rail to "haul" himself up to EOB  Transfers Overall transfer level: Needs assistance   Transfers: Sit to/from Stand Sit to Stand: Supervision         General transfer comment: appropriately safe transfer  Ambulation/Gait Ambulation/Gait assistance: Supervision Ambulation Distance (Feet): 240 Feet Assistive device: None (cane) Gait  Pattern/deviations: Step-through pattern;Decreased step length - right;Decreased step length - left;Scissoring;Wide base of support Gait velocity: slower   General Gait Details: gait steady, but atypical with large lateral w/shifts and little evidence of heel/toe, but more straight-legged gait  Stairs Stairs: Yes Stairs assistance: Min guard Stair Management: One rail Right;Alternating pattern;Forwards;Step to pattern Number of Stairs: 2 General stair comments: heavy use of the rail.  I suspect use of cane to be difficult for him.  Wheelchair Mobility    Modified Rankin (Stroke Patients Only)       Balance Overall balance assessment: Needs assistance Sitting-balance support: Feet supported;No upper extremity supported Sitting balance-[name]: Fair       Standing balance-[name]: Fair                               Pertinent Vitals/Pain Pain Assessment: No/denies pain    Home Living Family/patient expects to be discharged to:: Private residence Living Arrangements: Alone Available Help at Discharge: Family;Available PRN/intermittently Type of Home: House Home Access: Stairs to enter Entrance Stairs-Rails: None (uses cane on steps) Entrance Stairs-Number of Steps: 2 Home Layout: One level Home Equipment: Cane - single point      Prior Function Level of Independence: Independent;Independent with assistive device(s)         Comments: usually does not use assistive device except on the steps     Hand Dominance        Extremity/Trunk Assessment  Lower Extremity Assessment: Overall WFL for tasks assessed;Generalized weakness (L weaker both proximally and distally)         Communication      Cognition Arousal/Alertness: Awake/alert Behavior During Therapy: WFL for tasks assessed/performed Overall Cognitive Status: Within Functional Limits for tasks assessed                      General Comments       Exercises        Assessment/Plan    PT Assessment Patient needs continued PT services  PT Diagnosis Abnormality of gait;Generalized weakness   PT Problem List Decreased strength;Decreased activity tolerance;Decreased mobility;Decreased knowledge of use of DME  PT Treatment Interventions DME instruction;Gait training;Functional mobility training;Stair training;Therapeutic activities;Therapeutic exercise;Patient/family education   PT Goals (Current goals can be found in the Care Plan section) Acute Rehab PT Goals Patient Stated Goal: Get home and improve my breathing PT Goal Formulation: With patient Time For Goal Achievement: 10/31/14 Potential to Achieve Goals: Good    Frequency Min 3X/week   Barriers to discharge        Co-evaluation               End of Session   Activity Tolerance: Patient tolerated treatment well Patient left: Other (comment);with nursing/sitter in room;with call bell/phone within reach (sitting EOB) Nurse Communication: Mobility status         Time: 7371-0626 PT Time Calculation (min) (ACUTE ONLY): 32 min   Charges:   PT Evaluation $Initial PT Evaluation Tier I: 1 Procedure PT Treatments $Gait Training: 8-22 mins $Therapeutic Activity: 8-22 mins   PT G Codes:        Oneill Bais, Tessie Fass 10/24/2014, 12:30 PM 10/24/2014  Donnella Sham, [place] [phone]  (pager)

## 2014-10-24 NOTE — Consult Note (Signed)
[name] [name]: 11:57 AM 10/24/2014  LOS: 2 days    Referring Provider: Dr Erlinda Hong Primary Care Physician:  [name], MD Primary Gastroenterologist:  Althia Forts. Greater than 15 years ago was a patient of Dr. Velora Heckler and then Dr. Henrene Pastor    Reason for Consultation:  Anemia and FOBT +   HPI: [name] [name] is a 81 y.o. male.  Patient is a morbidly obese diabetic. Status post remote partial colectomy for history of ulcerative colitis.  He had subtotal colectomy initially in 1985 but in 1992 he underwent complete resection and closure of the rectal stump. History of atrial fibrillation.  On 06/19/2014 he underwent cardiac cath with placement of drug-eluting stent.  His med list includes Plavix, 81 mg aspirin, Zantac. He had previously been on brilinta but was switched to Plavix in September 2015.  Patient says that in the 1960s he was told he had an ulcer. He also had some sort of GI bleeding that was attributed to aspirin, this was 15 or more years ago. He may have had upper endoscopy at that time but doesn't recall it specifically.  Patient seen by his cardiologist about a week ago. BNP was elevated and Lasix was added but this did not improve his dyspnea.  Patient's complaint coming to the emergency room is shortness of breath. He also has some acute, limited nonfocal abdominal pain on the day of admission. It resolved by the time he was examined in the ED. Hemoglobin baseline is about 12-12.7. 2 days ago, on January 6, hemoglobin was 8.8 it has now drifted to 7.6, 2 days later. 2 days ago his fecal occult blood test was positive.  He has received 2 units of packed red blood cells today CT scan shows a 3 mm right ureteral stone with some associated mild hydroureteronephosis. Also there is failed nonobstructing left  kidney stone. He passed the stone during the evening of January 7.  He had not seen any hematuria.  Patient has a ileostomy and all the stool has been brown. He has never seen bloody or melenic material. His appetite is good. He hasn't had weight fluctuation. There is no no nausea or vomiting.  He has not seen blood in the urine.     Past Medical History  Diagnosis Date  . Diabetes   . Obesity   . Ulcerative colitis   . HTN (hypertension)   . Dyslipidemia   . GERD (gastroesophageal reflux disease)   . History of peptic ulcer disease   . Ulcerative colitis     s/p total colectomy  . Anemia   . Macular degeneration   . Kidney stone may 2009    Right hydronephrosis, S/P stone removal  . Small bowel obstruction, partial 2009    Episode  . Shingles     episode  . Compression fracture of L1 lumbar vertebra   . Osteopenia   . Lower back pain   . OA (osteoarthritis) of hip   . CKD (chronic kidney disease), stage III   . Hx of acute renal failure 01/02/10-3/24-11  due to hypovolemic shock, gastroenteritis and dehydration,hospitalized . Did requre a few days of dialysis. Cr at discharge was 1.8  . A-fib     s/p ablation, NSR  . Asymptomatic PVCs   . CAD (coronary artery disease) 06/2014    Occluded LAD with faint collaterals, 90% ramus s/p DES  . Complication of anesthesia     " during my kidney stone surgery my heart went out of rhythm  . History of blood transfusion ? date; 10/22/2014    "from my ulcerative colitis; don't know where it's coming from today" (10/22/2014)    Past Surgical History  Procedure Laterality Date  . Back surgery    . Colon resection    . Hernia repair    . Tonsillectomy    . Cardiac catheterization  06/19/2014  . Coronary angioplasty    . Coronary stent placement  06/19/2014    DES       dr Martinique  . Left and right heart catheterization with coronary angiogram N/A 06/19/2014    Procedure: LEFT AND RIGHT HEART CATHETERIZATION WITH CORONARY ANGIOGRAM;   Surgeon: Peter M Martinique, MD;  Location: [facility] CATH LAB;  Service: Cardiovascular;  Laterality: N/A;    Prior to Admission medications   Medication Sig Start Date End Date Taking? Authorizing Provider  amLODipine (NORVASC) 5 MG tablet Take 1 tablet (5 mg total) by mouth daily. 05/09/14  Yes Sueanne Margarita, MD  aspirin 81 MG tablet Take 81 mg by mouth daily.   Yes Historical Provider, MD  calcium carbonate (OS-CAL) 600 MG TABS tablet Take 600 mg by mouth daily with breakfast.   Yes Historical Provider, MD  clopidogrel (PLAVIX) 75 MG tablet Take 1 tablet (75 mg total) by mouth daily. 10/14/14  Yes Sueanne Margarita, MD  furosemide (LASIX) 20 MG tablet Take 1 tablet (20 mg total) by mouth daily. Take 40 mg your first two days. 10/15/14  Yes Sueanne Margarita, MD  glyBURIDE-metformin (GLUCOVANCE) 5-500 MG per tablet Take 1 tablet by mouth 2 (two) times daily with a meal. Hold for 48 hours, restart on 06/22/2014 06/20/14  Yes Rhonda G Barrett, PA-C  metoprolol (LOPRESSOR) 50 MG tablet Take 1.5 tablets (75 mg total) by mouth 2 (two) times daily. 05/06/14  Yes Sueanne Margarita, MD  pioglitazone (ACTOS) 30 MG tablet Take 30 mg by mouth daily.   Yes Historical Provider, MD  ranitidine (ZANTAC) 75 MG tablet Take 150 mg by mouth 2 (two) times daily. Patient unsure of exact dosage   Yes Historical Provider, MD  saxagliptin HCl (ONGLYZA) 5 MG TABS tablet Take 1 tablet (5 mg total) by mouth daily. 06/20/14  Yes Tanda Rockers, MD  simvastatin (ZOCOR) 20 MG tablet Take 20 mg by mouth daily.   Yes Historical Provider, MD    Scheduled Meds: . aspirin  81 mg Oral Daily  . clopidogrel  75 mg Oral Daily  . insulin aspart  0-9 Units Subcutaneous TID WC  . linagliptin  5 mg Oral Daily  . metoprolol  12.5 mg Oral BID  . simvastatin  20 mg Oral q1800  . sodium chloride  3 mL Intravenous Q12H  . tamsulosin  0.4 mg Oral QPC supper   Infusions:   PRN Meds: acetaminophen **OR** acetaminophen, HYDROcodone-acetaminophen, morphine  injection, ondansetron **OR** ondansetron (ZOFRAN) IV   Allergies as of 10/22/2014 - Review Complete 10/22/2014  Allergen Reaction Noted  . Penicillins Rash 10/02/2013  . Sulfa antibiotics Rash 10/02/2013    Family History  Problem Relation Age of Onset  . Colon cancer Mother     History   Social History  . Marital Status: Widowed    Spouse Name: N/A    Number of Children: N/A  . Years of Education: N/A   Occupational History  . Not on file.   Social History Main Topics  . Smoking status: Never Smoker   . Smokeless tobacco: Never Used  . Alcohol Use: No  . Drug Use: No  . Sexual Activity: Not on file   Other Topics Concern  . Not on file   Social History Narrative    REVIEW OF SYSTEMS: Constitutional:  No weight loss. No profound fatigue. ENT:  No nose bleeds Pulm:  Shortness of breath, no productive cough. CV:  No palpitations, no LE edema. No chest pain GU:  No hematuria, no frequency.   GI:  Per HPI Heme:  Gen. he has not had issues with anemia, no excessive bleeding or bruising.   Transfusions:  Has gotten 2 units during this admission but previous to that no transfusions. Neuro:  No headaches, no peripheral tingling or numbness Derm:  No itching, no rash or sores.  Endocrine:  No sweats or chills.  No polyuria or dysuria Immunization:  Flu shot in September 2015 Travel:  None beyond local counties in last few months.    PHYSICAL EXAM: Vital signs in last 24 hours: Filed Vitals:   10/24/14 1103  BP: 103/43  Pulse: 68  Temp: 98.1 F (36.7 C)  Resp: 22   Wt Readings from Last 3 Encounters:  10/23/14 245 lb 8 oz (111.358 kg)  10/14/14 254 lb 12.8 oz (115.577 kg)  07/15/14 255 lb (115.667 kg)    General: Obese, pale, chronically unwell-appearing WM. He is comfortable. Head:  No swelling, no signs of head trauma. No facial asymmetry  Eyes:  No icterus, no conjunctival pallor. Ears:  Hearing intact  Nose:  No congestion or discharge Mouth:   Moist, pink and clear oral mucosa Neck:  No mass, no JVD, no TMG. Lungs:  Greatly reduced breath sounds throughout. No adventitious sounds. No dyspnea at rest. No cough Heart: RRR. No MRG. S1-S2 audible Abdomen:  Obese, soft, NT, ND. Bowel sounds active. No HSM appreciated. No masses. No bruits.  Ostomy bag in place, it is empty. Rectal: Did not perform   Musc/Skeltl: No joint swelling, no erythema. Spine is kyphotic.  Extremities:  No CCE.  Neurologic:  Alert, oriented 3. Speech somewhat slow but accurate and easily understood. Able to ambulate on his own without assistance of others or of appliances.  Gait is wide and lurching in pattern. Skin:  No telangiectasia, no sores, no rashes Tattoos:  None Nodes:  No cervical adenopathy   Psych:  Pleasant, relaxed, normal affect.  Intake/Output from previous day: 01/07 0701 - 01/08 0700 In: 4091.3 [P.O.:1320; I.V.:1771.3; IV Piggyback:1000] Out: 2260 [Urine:2260] Intake/Output this shift: Total I/O In: -  Out: 100 [Urine:100]  LAB RESULTS:  Recent Labs  10/22/14 1029 10/23/14 0028 10/24/14 0525  WBC 12.3* 9.1  8.7 7.9  HGB 8.8* 8.1*  8.3* 7.6*  HCT 29.0* 26.0*  26.5* 25.2*  PLT 414* 336  293 288   BMET Lab Results  Component Value Date   NA 135 10/24/2014   NA 136 10/23/2014   NA 136 10/22/2014   K 4.5 10/24/2014   K 4.7 10/23/2014   K 5.0 10/22/2014   CL 111 10/24/2014   CL 108 10/23/2014   CL  106 10/22/2014   CO2 20 10/24/2014   CO2 20 10/23/2014   CO2 19 10/22/2014   GLUCOSE 146* 10/24/2014   GLUCOSE 126* 10/23/2014   GLUCOSE 165* 10/22/2014   BUN 30* 10/24/2014   BUN 47* 10/23/2014   BUN 46* 10/22/2014   CREATININE 1.44* 10/24/2014   CREATININE 2.56* 10/23/2014   CREATININE 2.27* 10/22/2014   CALCIUM 7.9* 10/24/2014   CALCIUM 8.5 10/23/2014   CALCIUM 9.0 10/22/2014   LFT  Recent Labs  10/22/14 1029  PROT 7.2  ALBUMIN 3.2*  AST 21  ALT 9  ALKPHOS 133*  BILITOT 0.7   PT/INR Lab Results    Component Value Date   INR 1.29 10/23/2014   INR 1.19 10/22/2014   INR 1.1* 06/18/2014   Hepatitis Panel No results for input(s): HEPBSAG, HCVAB, HEPAIGM, HEPBIGM in the last 72 hours. C-Diff No components found for: CDIFF Lipase  No results found for: LIPASE  Drugs of Abuse  No results found for: LABOPIA, COCAINSCRNUR, LABBENZ, AMPHETMU, THCU, LABBARB   RADIOLOGY STUDIES: Ct Abdomen Pelvis Wo Contrast  10/22/2014   CLINICAL DATA:  Acute abdominal pain. Dizziness and hyperglycemia. History of ulcerative colitis, small-bowel obstruction, peptic ulcer disease, kidney stone, and chronic kidney disease.  EXAM: CT ABDOMEN AND PELVIS WITHOUT CONTRAST  TECHNIQUE: Multidetector CT imaging of the abdomen and pelvis was performed following the standard protocol without IV contrast.  COMPARISON:  06/01/2010  FINDINGS: Subsegmental atelectasis is present in the visualized lung bases. Coronary artery calcification is partially visualized.  The liver, gallbladder, spleen, and adrenal glands have an unremarkable unenhanced appearance. There is fatty infiltration of the pancreas. There is a 3 mm calculus in the proximal right ureter with mild right hydroureteronephrosis and perinephric stranding. Both kidneys demonstrate atrophy. A nonobstructing stone is again seen in the lower pole of the left kidney measuring 6 mm, slightly larger than on the prior study. No left ureteral stones are seen.  Bladder is grossly unremarkable. Sequelae of prior colectomy are again identified with a right lower quadrant ileostomy remaining in place. Oral contrast is present in loops of nondilated small bowel to the level of the ileostomy without evidence of obstruction. A fat containing ventral abdominal hernia is again seen to the right of midline medial to the ileostomy. Moderate atherosclerotic vascular calcification is noted. No free fluid or enlarged lymph nodes are identified. Chronic L1 compression fracture is noted status  post vertebral augmentation.  IMPRESSION: 1. 3 mm proximal right ureteral stone with mild right hydroureteronephrosis. 2. Nonobstructing left nephrolithiasis.   Electronically Signed   By: Logan Bores   On: 10/22/2014 14:43    ENDOSCOPIC STUDIES: There are none in Epic.  IMPRESSION:   *  Anemia with FOBT positive stool.  Status post transfusion with 2 units PRBCs.  His iron and iron saturation are low. His ferritin is 12 so this is iron deficiency anemia.  He has orders to start oral iron tomorrow.  *  Status post total colectomy for ulcerative colitis. He has a long-term well-functioning ostomy in place. He has no active GI issue  *  Nephrolithiasis. Was able to spontaneously pass the stone.  *  Status post drug-eluting stent placement 06/19/2014.  *  Remote colectomy for history of ulcerative colitis.  *  ARF associated with obstructing nephrolithiasis/hydronephrosis. BUN and creatinine are improving.    PLAN:     *  Ideally patient should undergo EGD. However he is on Plavix which she has received throughout his hospitalization, last  dose was given this morning. M.D. will need to discuss whether or not this medication can be held for the required five-day interval which would allow for endoscopy. Another option would be to perform a diagnostic only endoscopy and reserve discontinuation of Plavix if there is something encountered which would require intervention.  *  Wonder if he should be initiated on erythropoietin etc?  *  Doing need to ask cardiology for their opinion regarding temporary cessation of Plavix?  *  Given possible history of ulcer disease and his current heme positivity, it's prudent to replace his home ranitidine with once daily Protonix.   Azucena Freed  10/24/2014, 11:57 AM Pager: [phone]       Attending physician's note   I have taken a history, examined the patient and reviewed the chart. I agree with the Advanced Practitioner's note, impression and  recommendations. Heme + stool and Fe deficiency anemia on Plavix with DES. Remote history of colectomy for UC. Transfusions to keep Hb > 8. Fe replacement. PPI bid for now and qd at discharge. Since he is not actively bleeding would favor an outpatient EGD and ileoscopy per ostomy off Plavix for 5 days.   Ladene Artist, MD [facility]

## 2014-10-24 NOTE — Progress Notes (Signed)
PROGRESS NOTE  QUANTARIUS GENRICH PYK:[id number] DOB: 1933/02/10 DOA: 10/22/2014 PCP: [name], MD  HPI/Recap of past 24 hours: bp improving, but still borderline low, denies chest pain, no dizziness, no ab pain, reported urinary stone passed last night  Assessment/Plan: Principal Problem:   Symptomatic anemia Active Problems:   Diabetes   Ulcerative colitis   A-fib   SOB (shortness of breath)   CAD in native artery- total LAD, s/p RI DES 06/19/14   Dyslipidemia   Abdominal pain, generalized   Ureteral stone with hydronephrosis   ARF (acute renal failure)  1. Symptomatic anemia with DOE/weakenss. Hgb7.6. Transfuse another unit prbc today. On overt blood loss, bp remain borderline low.   Iron deficiency. Start ferrous sulfate. b12 result pending.  plavix continued due to recent cardiac stent placement. (06/2014). 2. Hypotension: from dehydration? No sign of infection. IVF. Hold lasix. Hold norvasc, decrease betablocker. Check blood culture/am cortisol level/tsh level/lactic acid. 3. ARF/right ureteral stone/mild right sided hydronephrosis:stone passed, cr back to baseline. ivf stopped per patinent's request, continue hold lasix. appreciate Urology consult. flomax started. 4. H/o UC s/p colectomy/diverting colostomy. Stool occult blood positive but not overt bleeding. GI consult pending. 5. FTT, PT/discharge planning  Code Status: full code  Family Communication: patient  Disposition Plan: remain inpatient   Consultants:  GI/urology  Procedures:  none  Antibiotics:  none   Objective: BP 103/43 mmHg  Pulse 68  Temp(Src) 98.1 F (36.7 C) (Oral)  Resp 22  Ht 5' 10"  (1.778 m)  Wt 111.358 kg (245 lb 8 oz)  BMI 35.23 kg/m2  SpO2 93%  Intake/Output Summary (Last 24 hours) at 10/24/14 1210 Last data filed at 10/24/14 1032  Gross per 24 hour  Intake 2611.25 ml  Output   2360 ml  Net 251.25 ml   Filed Weights   10/23/14 0700  Weight: 111.358 kg (245 lb 8 oz)     Exam:   General:  Frail, nad  Cardiovascular: RRR  Respiratory: CTABL  Abdomen: well healed midline surgical scar. Diverting colostomy with brown color fecal matters.  Musculoskeletal: no edema  Data Reviewed: Basic Metabolic Panel:  Recent Labs Lab 10/22/14 1029 10/23/14 0028 10/24/14 0525  NA 136 136 135  K 5.0 4.7 4.5  CL 106 108 111  CO2 19 20 20   GLUCOSE 165* 126* 146*  BUN 46* 47* 30*  CREATININE 2.27* 2.56* 1.44*  CALCIUM 9.0 8.5 7.9*   Liver Function Tests:  Recent Labs Lab 10/22/14 1029  AST 21  ALT 9  ALKPHOS 133*  BILITOT 0.7  PROT 7.2  ALBUMIN 3.2*   No results for input(s): LIPASE, AMYLASE in the last 168 hours. No results for input(s): AMMONIA in the last 168 hours. CBC:  Recent Labs Lab 10/22/14 1029 10/23/14 0028 10/24/14 0525  WBC 12.3* 9.1  8.7 7.9  NEUTROABS 9.9*  --   --   HGB 8.8* 8.1*  8.3* 7.6*  HCT 29.0* 26.0*  26.5* 25.2*  MCV 79.9 80.5  80.5 80.0  PLT 414* 336  293 288   Cardiac Enzymes:    Recent Labs Lab 10/22/14 1029  TROPONINI <0.03   BNP (last 3 results)  Recent Labs  06/17/14 1237 10/14/14 1111  PROBNP 315.0* 459.0*   CBG:  Recent Labs Lab 10/22/14 1812 10/23/14 1323 10/23/14 1759 10/23/14 2149 10/24/14 0752  GLUCAP 185* 115* 141* 120* 136*    Recent Results (from the past 240 hour(s))  Urine culture     Status: None  Collection Time: 10/22/14 10:52 AM  Result Value Ref Range Status   Specimen Description URINE, CLEAN CATCH  Final   Special Requests NONE  Final   Colony Count   Final    7,000 COLONIES/ML Performed at [facility]    Culture   Final    INSIGNIFICANT GROWTH Performed at [facility]    Report Status 10/23/2014 FINAL  Final     Studies: No results found.  Scheduled Meds: . aspirin  81 mg Oral Daily  . clopidogrel  75 mg Oral Daily  . insulin aspart  0-9 Units Subcutaneous TID WC  . linagliptin  5 mg Oral Daily  . metoprolol  12.5 mg  Oral BID  . simvastatin  20 mg Oral q1800  . sodium chloride  3 mL Intravenous Q12H  . tamsulosin  0.4 mg Oral QPC supper    Continuous Infusions: stopped       Joan Avetisyan  [facility] Pager [phone]. If 7PM-7AM, please contact night-coverage at [email or web address], password [facility] 10/24/2014, 12:10 PM  LOS: 2 days

## 2014-10-25 DIAGNOSIS — K922 Gastrointestinal hemorrhage, unspecified: Secondary | ICD-10-CM | POA: Insufficient documentation

## 2014-10-25 LAB — TYPE AND SCREEN
ABO/RH(D): A POS
Antibody Screen: NEGATIVE
Unit division: 0
Unit division: 0

## 2014-10-25 LAB — BASIC METABOLIC PANEL
Anion gap: 6 (ref 5–15)
BUN: 21 mg/dL (ref 6–23)
CO2: 18 mmol/L — ABNORMAL LOW (ref 19–32)
Calcium: 8 mg/dL — ABNORMAL LOW (ref 8.4–10.5)
Chloride: 108 mEq/L (ref 96–112)
Creatinine, Ser: 1.23 mg/dL (ref 0.50–1.35)
GFR calc Af Amer: 63 mL/min — ABNORMAL LOW (ref >90)
GFR calc non Af Amer: 54 mL/min — ABNORMAL LOW (ref >90)
Glucose, Bld: 157 mg/dL — ABNORMAL HIGH (ref 70–99)
Potassium: 4.2 mmol/L (ref 3.5–5.1)
Sodium: 132 mmol/L — ABNORMAL LOW (ref 135–145)

## 2014-10-25 LAB — GLUCOSE, CAPILLARY
Glucose-Capillary: 165 mg/dL — ABNORMAL HIGH (ref 70–99)
Glucose-Capillary: 172 mg/dL — ABNORMAL HIGH (ref 70–99)
Glucose-Capillary: 203 mg/dL — ABNORMAL HIGH (ref 70–99)
Glucose-Capillary: 167 mg/dL — ABNORMAL HIGH (ref 70–99)

## 2014-10-25 LAB — CBC
HCT: 27.9 % — ABNORMAL LOW (ref 39.0–52.0)
Hemoglobin: 8.7 g/dL — ABNORMAL LOW (ref 13.0–17.0)
MCH: 24.9 pg — ABNORMAL LOW (ref 26.0–34.0)
MCHC: 31.2 g/dL (ref 30.0–36.0)
MCV: 79.7 fL (ref 78.0–100.0)
Platelets: 268 10*3/uL (ref 150–400)
RBC: 3.5 MIL/uL — ABNORMAL LOW (ref 4.22–5.81)
RDW: 15.1 % (ref 11.5–15.5)
WBC: 9.5 10*3/uL (ref 4.0–10.5)

## 2014-10-25 LAB — CORTISOL-AM, BLOOD: Cortisol - AM: 13.7 ug/dL (ref 4.3–22.4)

## 2014-10-25 MED ORDER — AMIODARONE LOAD VIA INFUSION
150.0000 mg | Freq: Once | INTRAVENOUS | Status: AC
  Administered 2014-10-25: 150 mg via INTRAVENOUS
  Filled 2014-10-25: qty 83.34

## 2014-10-25 MED ORDER — AMIODARONE HCL IN DEXTROSE 360-4.14 MG/200ML-% IV SOLN
60.0000 mg/h | INTRAVENOUS | Status: AC
  Administered 2014-10-25 (×2): 60 mg/h via INTRAVENOUS
  Filled 2014-10-25: qty 200

## 2014-10-25 MED ORDER — AMIODARONE HCL IN DEXTROSE 360-4.14 MG/200ML-% IV SOLN
30.0000 mg/h | INTRAVENOUS | Status: DC
Start: 2014-10-25 — End: 2014-10-26
  Administered 2014-10-25 – 2014-10-26 (×2): 30 mg/h via INTRAVENOUS
  Filled 2014-10-25 (×6): qty 200

## 2014-10-25 MED ORDER — METOPROLOL TARTRATE 1 MG/ML IV SOLN
2.5000 mg | INTRAVENOUS | Status: DC | PRN
  Filled 2014-10-25: qty 5

## 2014-10-25 NOTE — Progress Notes (Signed)
Pt with onset of SVT 197, AFib.  MD notified, EKG obtained.  No new orders at this time.  Will continue to monitor. Cori Razor, RN

## 2014-10-25 NOTE — Progress Notes (Signed)
Pt transported to 3E15 via bed.

## 2014-10-25 NOTE — Progress Notes (Signed)
Pt has given permission for the Doctor to talk to his sisters Wallis Bamberg, Azucena Freed) regarding his medical care.  Both sisters can be reached at the numbers listed in the patient's chart.  Will continue to monitor. Cori Razor, RN

## 2014-10-25 NOTE — Progress Notes (Signed)
Pt arrived tot he unit AAOx3 hr 140s non sustained oriented to the room and explained the plan of care. Started amiodarone drips stared

## 2014-10-25 NOTE — Consult Note (Signed)
CARDIOLOGY CONSULT NOTE   Patient ID: [name] [name] MRN: [id number], DOB/AGE: Jun 04, 1933   Admit date: 10/22/2014 Date of Consult: 10/25/2014  Primary Physician: [name], MD Primary Cardiologist: Dr [name]  Reason for consult:  Microcytic anemia, GIB on DAPT, recent DES  Problem List  Past Medical History  Diagnosis Date  . Diabetes   . Obesity   . Ulcerative colitis   . HTN (hypertension)   . Dyslipidemia   . GERD (gastroesophageal reflux disease)   . History of peptic ulcer disease   . Ulcerative colitis     s/p total colectomy  . Anemia   . Macular degeneration   . Kidney stone may 2009    Right hydronephrosis, S/P stone removal  . Small bowel obstruction, partial 2009    Episode  . Shingles     episode  . Compression fracture of L1 lumbar vertebra   . Osteopenia   . Lower back pain   . OA (osteoarthritis) of hip   . CKD (chronic kidney disease), stage III   . Hx of acute renal failure 01/02/10-3/24-11    due to hypovolemic shock, gastroenteritis and dehydration,hospitalized . Did requre a few days of dialysis. Cr at discharge was 1.8  . A-fib     s/p ablation, NSR  . Asymptomatic PVCs   . CAD (coronary artery disease) 06/2014    Occluded LAD with faint collaterals, 90% ramus s/p DES  . Complication of anesthesia     " during my kidney stone surgery my heart went out of rhythm  . History of blood transfusion ? date; 10/22/2014    "from my ulcerative colitis; don't know where it's coming from today" (10/22/2014)    Past Surgical History  Procedure Laterality Date  . Back surgery    . Colon resection    . Hernia repair    . Tonsillectomy    . Cardiac catheterization  06/19/2014  . Coronary angioplasty    . Coronary stent placement  06/19/2014    DES       dr Martinique  . Left and right heart catheterization with coronary angiogram N/A 06/19/2014    Procedure: LEFT AND RIGHT HEART CATHETERIZATION WITH CORONARY ANGIOGRAM;  Surgeon: Peter M Martinique, MD;   Location[facility] CATH LAB;  Service: Cardiovascular;  Laterality: N/A;    Allergies  Allergies  Allergen Reactions  . Penicillins Rash  . Sulfa Antibiotics Rash   HPI   [name] [name] is a 81 y.o. male with a history of PAF,and chronic SOB. Chest CT angio, done for w/u of pulmonary HTN, showed enlarged pulmonary arteries and atherosclerosis of the coronary arteries. Nuclear stress test showed an intermediate risk stress nuclear study with medium size mild severity ischemia in the LAD territory and a small scar with periinfarct ischemia in the PDA territory (total SDS 6). He underwent cardiac cath showing 2 vessel ASCAD with chronic total occlusion of the LAD with minimal collaterals and severe disease of the large ramus and normal LVEF. He underwent DES to the ramus in September 2015. He is on DAPT. Right heart cath demonstrated no evidence of pulmonary HTN. He denies any chest pain, LE edema, dizziness, palpitations or syncope. He has chronic SOB which significantly limits is activities. He thinks the SOB may be worse since going on the Brilinta.He is followed in the clinic by Dr Radford Pax, the last seen just 2 weeks ago, when his Brilinta was changed o Plavix as he felt SOB.Marland Kitchen  He has h/o  ulcerative colitis and is s/p remote partial colectomy. He presented on 10/22/2014 with abdominal pain and was found to be anemic, with Heme + stool and Fe deficiency anemia. He received transfusion of PRBC, Hb increased to 8.7. Fe replacement. Recommend IV Fe infusion in hospital.   The patient went into a-fib with RVR this morning. He is not aware of it. He has prior h/o a-fib in 2011, in [place] on metoprolol since then.   Inpatient Medications  . aspirin  81 mg Oral Daily  . clopidogrel  75 mg Oral Daily  . ferrous sulfate  325 mg Oral BID WC  . insulin aspart  0-9 Units Subcutaneous TID WC  . linagliptin  5 mg Oral Daily  . metoprolol  12.5 mg Oral BID  . pantoprazole  40 mg Oral BID  . simvastatin  20 mg  Oral q1800  . sodium chloride  3 mL Intravenous Q12H  . tamsulosin  0.4 mg Oral QPC supper   Family History Family History  Problem Relation Age of Onset  . Colon cancer Mother     Social History History   Social History  . Marital Status: Widowed    Spouse Name: N/A    Number of Children: N/A  . Years of Education: N/A   Occupational History  . Not on file.   Social History Main Topics  . Smoking status: Never Smoker   . Smokeless tobacco: Never Used  . Alcohol Use: No  . Drug Use: No  . Sexual Activity: Not on file   Other Topics Concern  . Not on file   Social History Narrative     Review of Systems  General:  No chills, fever, night sweats or weight changes.  Cardiovascular:  No chest pain, dyspnea on exertion, edema, orthopnea, palpitations, paroxysmal nocturnal dyspnea. Dermatological: No rash, lesions/masses Respiratory: No cough, dyspnea Urologic: No hematuria, dysuria Abdominal:   No nausea, vomiting, diarrhea, bright red blood per rectum, melena, or hematemesis Neurologic:  No visual changes, wkns, changes in mental status. All other systems reviewed and are otherwise negative except as noted above.  Physical Exam  Blood pressure 92/53, pulse 81, temperature 97.7 F (36.5 C), temperature source Oral, resp. rate 20, height 5' 10"  (1.778 m), weight 245 lb 8 oz (111.358 kg), SpO2 97 %.  General: Pleasant, NAD Psych: Normal affect. Neuro: Alert and oriented X 3. Moves all extremities spontaneously. HEENT: Normal  Neck: Supple without bruits or JVD. Lungs:  Resp regular and unlabored, CTA. Heart: RRR no s3, s4, or murmurs. Abdomen: Soft, non-tender, non-distended, BS + x 4.  Extremities: No clubbing, cyanosis or edema. DP/PT/Radials 2+ and equal bilaterally.  Labs  No results for input(s): CKTOTAL, CKMB, TROPONINI in the last 72 hours. Lab Results  Component Value Date   WBC 9.5 10/25/2014   HGB 8.7* 10/25/2014   HCT 27.9* 10/25/2014   MCV 79.7  10/25/2014   PLT 268 10/25/2014    Recent Labs Lab 10/22/14 1029  10/25/14 0450  NA 136  < > 132*  K 5.0  < > 4.2  CL 106  < > 108  CO2 19  < > 18*  BUN 46*  < > 21  CREATININE 2.27*  < > 1.23  CALCIUM 9.0  < > 8.0*  PROT 7.2  --   --   BILITOT 0.7  --   --   ALKPHOS 133*  --   --   ALT 9  --   --  AST 21  --   --   GLUCOSE 165*  < > 157*  < > = values in this interval not displayed. Lab Results  Component Value Date   CHOL  05/22/2008    135        ATP III CLASSIFICATION:  <200     mg/dL   Desirable  200-239  mg/dL   Borderline High  >=240    mg/dL   High   HDL 38* 05/22/2008   LDLCALC  05/22/2008    72        Total Cholesterol/HDL:CHD Risk Coronary Heart Disease Risk Table                     Men   Women  1/2 Average Risk   3.4   3.3   TRIG 127 05/22/2008   Radiology/Studies  Ct Abdomen Pelvis Wo Contrast  10/22/2014   CLINICAL DATA:  Acute abdominal pain. Dizziness and hyperglycemia. History of ulcerative colitis, small-bowel obstruction, peptic ulcer disease, kidney stone, and chronic kidney disease. IMPRESSION: 1. 3 mm proximal right ureteral stone with mild right hydroureteronephrosis. 2. Nonobstructing left nephrolithiasis.   Electronically Signed   By: Logan Bores   On: 10/22/2014 14:43   Dg Chest 2 View  10/22/2014   CLINICAL DATA:  Chronic shortness of breath.  EXAM: CHEST  2 VIEW  COMPARISON:   IMPRESSION: 1. Stable cardiomegaly, no CHF. 2. Mild bibasilar subsegmental atelectasis and/or scar.     Echocardiogram -  10/23/2013 - Left ventricle: The cavity size was normal. Systolic function was normal. The estimated ejection fraction was in the range of 50% to 55%. There is hypokinesis of the basal-midinferior myocardium. Features are consistent with a pseudonormal left ventricular filling pattern, with concomitant abnormal relaxation and increased filling pressure (grade 2 diastolic dysfunction). - Mitral valve: Calcified annulus. There was mild  regurgitation. - Left atrium: The atrium was moderately dilated. - Right ventricle: The cavity size was mildly dilated. Wall thickness was normal. Impressions: - Compared to the prior study, there has been no significant interval change.  ECG: SR, RBBB, LAFB, unchanged from prior,no new ischemic changes  Telemetry: SR --> a-fib with RVR this am at 8   ASSESSMENT AND PLAN  1. Microcytic anemia, GIB - on dual antiplatelet therapy, Heme + stool and Fe deficiency anemia. He received transfusion of PRBC, Hb increased from 7.6 to 8.7. Fe replacement. Recommend IV Fe infusion in hospital.  GI recommended PPI bid for now and PPI qd at discharge and since he is not actively bleeding they are planning for an outpatient EGD and ileoscopy per ostomy off Plavix for 5 days with Dr. Scarlette Shorts.  - the patient is only 3 months after DES placement, the risk of in stent restenosis is very high and we don't recommend to stop plavix if at all possible, but rather wait another 3 months. If necessary hold aspirin and continue plavix as monotherapy. - since patient had a recent cath we would prefer Hb > 10  2. New onset a-fib with RVR - soft BP, we will start amiodarone drip, unable to start anticoagulation as he has GIB.   3. CAD in native artery- total LAD, s/p RI DES 06/19/14 Recent work up for [facility] included an abnormal Myoview. Subsequent elective cath showed total LAD and RI disease. He underwent RI DES. Continue DAPT with ASA and Plavix.  Continue metoprolol (low dose as BP soft) and simvastatin  4. Obesity Sleep study was  cancelled since no evidence of pulmonary HTN on cath  5. Benign essential HTN BP soft with acute blood loss, amlodipine was discontinued, continue low dose metoprolol  6. Dyslipidemia - continue Zocor. Check LFTs and ALT   Signed, Dorothy Spark, MD, [facility] 10/25/2014, 1:36 PM

## 2014-10-25 NOTE — Progress Notes (Signed)
    Progress Note   Subjective  Feels better post transfusion. No GI complaints.    Objective  Vital signs in last 24 hours: Temp:  [97.8 F (36.6 C)-98.1 F (36.7 C)] 98.1 F (36.7 C) (01/09 0535) Pulse Rate:  [67-76] 73 (01/09 0535) Resp:  [18-22] 20 (01/09 0535) BP: (102-136)/(31-59) 103/31 mmHg (01/09 0535) SpO2:  [93 %-98 %] 97 % (01/09 0535) Last BM Date: 10/25/14   General:   Alert, well-developed, obese  in NAD Heart:  Regular rate and rhythm; no murmurs Abdomen:  Soft, large, nontender and nondistended. Ostomy intact, normal appearing with brown stool. Normal bowel sounds, without guarding, and without rebound.   Extremities:  Without edema. Neurologic:  Alert and  oriented x4;  grossly normal neurologically. Psych:  Alert and cooperative. Normal mood and affect.  Intake/Output from previous day: 01/08 0701 - 01/09 0700 In: 812 [P.O.:480; I.V.:3; Blood:329] Out: 1150 [Urine:1150] Intake/Output this shift: Total I/O In: 360 [P.O.:360] Out: 600 [Urine:600]  Lab Results:  Recent Labs  10/23/14 0028 10/24/14 0525 10/25/14 0450  WBC 9.1  8.7 7.9 9.5  HGB 8.1*  8.3* 7.6* 8.7*  HCT 26.0*  26.5* 25.2* 27.9*  PLT 336  293 288 268   BMET  Recent Labs  10/23/14 0028 10/24/14 0525 10/25/14 0450  NA 136 135 132*  K 4.7 4.5 4.2  CL 108 111 108  CO2 20 20 18*  GLUCOSE 126* 146* 157*  BUN 47* 30* 21  CREATININE 2.56* 1.44* 1.23  CALCIUM 8.5 7.9* 8.0*   LFT  Recent Labs  10/22/14 1029  PROT 7.2  ALBUMIN 3.2*  AST 21  ALT 9  ALKPHOS 133*  BILITOT 0.7   PT/INR  Recent Labs  10/22/14 1502 10/23/14 0028  LABPROT 15.2 16.2*  INR 1.19 1.29      Assessment & Plan   Heme + stool and Fe deficiency anemia on Plavix with DES. Remote history of colectomy for UC. Hb increased to 8.7. Fe replacement. Recommend IV Fe infusion in hospital. PPI bid for now and PPI qd at discharge. Since he is not actively bleeding will plan for an outpatient EGD and  ileoscopy per ostomy off Plavix for 5 days with Dr. Scarlette Shorts. Our office will contact him on Monday to arrange appt. GI signing off.    Principal Problem:   Symptomatic anemia Active Problems:   Diabetes   Ulcerative colitis   A-fib   SOB (shortness of breath)   CAD in native artery- total LAD, s/p RI DES 06/19/14   Dyslipidemia   Abdominal pain, generalized   Ureteral stone with hydronephrosis   ARF (acute renal failure)   Hypotension    LOS: 3 days   Malcolm T. Fuller Plan MD 10/25/2014, 9:10 AM

## 2014-10-25 NOTE — Progress Notes (Signed)
PROGRESS NOTE  [name] [name] XBL:[id number] DOB: [birth year] DOA: 10/22/2014 PCP: [name], MD  HPI/Recap of past 24 hours:  Patient developed afib/rvr, with low normal bp, denies chest pain, no dizziness. Assessment/Plan: Principal Problem:   Symptomatic anemia Active Problems:   Diabetes   Ulcerative colitis   A-fib   SOB (shortness of breath)   CAD in native artery- total LAD, s/p RI DES 06/19/14   Dyslipidemia   Abdominal pain, generalized   Ureteral stone with hydronephrosis   ARF (acute renal failure)   Hypotension  --afib/rvr: cardiology consult appreciated, amio drip started, patient is to transfer to different floor for drip. Not a candidate for anticoagulation due to gi bleed/symptomatic anemia.  --Symptomatic anemia with DOE/weakenss. s/p prbcx2. On overt blood loss, bp remain borderline low.   Iron deficiency. Start ferrous sulfate. b12 low normal, will start b12 supplement as well. plavix continued due to recent cardiac stent placement. (06/2014). GI consult appreciated.  -- Hypotension: from hypovolemia/aifb? Hold norvasc, decrease betablocker. blood culture no growth. ua no infection. am cortisol level/tsh level/lactic acid wnl.  -- ARF/right ureteral stone/mild right sided hydronephrosis:stone passed, cr back to baseline. ivf stopped per patinent's request, continue hold lasix. appreciate Urology consult. flomax started.  -- H/o UC s/p colectomy/diverting colostomy. Stool occult blood positive but not overt bleeding. GI consult appreciated.  -- FTT, PT/discharge planning  Code Status: full code  Family Communication: patient and sister  Disposition Plan: transfer to floor that can administrate amiodarone drip   Consultants:  GI/urology/cardiology  Procedures:  none  Antibiotics:  none   Objective: BP 102/40 mmHg  Pulse 101  Temp(Src) 97.7 F (36.5 C) (Oral)  Resp 16  Ht 5' 10"  (1.778 m)  Wt 111.358 kg (245 lb 8 oz)  BMI 35.23 kg/m2   SpO2 99%  Intake/Output Summary (Last 24 hours) at 10/25/14 1511 Last data filed at 10/25/14 1232  Gross per 24 hour  Intake    603 ml  Output   2025 ml  Net  -1422 ml   Filed Weights   10/23/14 0700  Weight: 111.358 kg (245 lb 8 oz)    Exam:   General:  Frail, nad  Cardiovascular: irregular  Respiratory: CTABL  Abdomen: well healed midline surgical scar. Diverting colostomy with brown color fecal matters.  Musculoskeletal: no edema  Data Reviewed: Basic Metabolic Panel:  Recent Labs Lab 10/22/14 1029 10/23/14 0028 10/24/14 0525 10/25/14 0450  NA 136 136 135 132*  K 5.0 4.7 4.5 4.2  CL 106 108 111 108  CO2 19 20 20  18*  GLUCOSE 165* 126* 146* 157*  BUN 46* 47* 30* 21  CREATININE 2.27* 2.56* 1.44* 1.23  CALCIUM 9.0 8.5 7.9* 8.0*   Liver Function Tests:  Recent Labs Lab 10/22/14 1029  AST 21  ALT 9  ALKPHOS 133*  BILITOT 0.7  PROT 7.2  ALBUMIN 3.2*   No results for input(s): LIPASE, AMYLASE in the last 168 hours. No results for input(s): AMMONIA in the last 168 hours. CBC:  Recent Labs Lab 10/22/14 1029 10/23/14 0028 10/24/14 0525 10/25/14 0450  WBC 12.3* 9.1  8.7 7.9 9.5  NEUTROABS 9.9*  --   --   --   HGB 8.8* 8.1*  8.3* 7.6* 8.7*  HCT 29.0* 26.0*  26.5* 25.2* 27.9*  MCV 79.9 80.5  80.5 80.0 79.7  PLT 414* 336  293 288 268   Cardiac Enzymes:    Recent Labs Lab 10/22/14 1029  TROPONINI <0.03   BNP (  last 3 results)  Recent Labs  06/17/14 1237 10/14/14 1111  PROBNP 315.0* 459.0*   CBG:  Recent Labs Lab 10/24/14 1207 10/24/14 1657 10/24/14 2053 10/25/14 0741 10/25/14 1151  GLUCAP 149* 101* 169* 165* 172*    Recent Results (from the past 240 hour(s))  Urine culture     Status: None   Collection Time: 10/22/14 10:52 AM  Result Value Ref Range Status   Specimen Description URINE, CLEAN CATCH  Final   Special Requests NONE  Final   Colony Count   Final    7,000 COLONIES/ML Performed at [facility]     Culture   Final    INSIGNIFICANT GROWTH Performed at [facility]    Report Status 10/23/2014 FINAL  Final     Studies: No results found.  Scheduled Meds: . amiodarone  150 mg Intravenous Once  . aspirin  81 mg Oral Daily  . clopidogrel  75 mg Oral Daily  . ferrous sulfate  325 mg Oral BID WC  . insulin aspart  0-9 Units Subcutaneous TID WC  . linagliptin  5 mg Oral Daily  . metoprolol  12.5 mg Oral BID  . pantoprazole  40 mg Oral BID  . simvastatin  20 mg Oral q1800  . sodium chloride  3 mL Intravenous Q12H  . tamsulosin  0.4 mg Oral QPC supper    Continuous Infusions: stopped . amiodarone     Followed by  . amiodarone         Noor Witte  [facility] Pager [phone]. If 7PM-7AM, please contact night-coverage at [email or web address], password [facility] 10/25/2014, 3:11 PM  LOS: 3 days

## 2014-10-26 LAB — CBC
HCT: 30.7 % — ABNORMAL LOW (ref 39.0–52.0)
Hemoglobin: 9.5 g/dL — ABNORMAL LOW (ref 13.0–17.0)
MCH: 25 pg — ABNORMAL LOW (ref 26.0–34.0)
MCHC: 30.9 g/dL (ref 30.0–36.0)
MCV: 80.8 fL (ref 78.0–100.0)
Platelets: 286 10*3/uL (ref 150–400)
RBC: 3.8 MIL/uL — ABNORMAL LOW (ref 4.22–5.81)
RDW: 15.4 % (ref 11.5–15.5)
WBC: 9.4 10*3/uL (ref 4.0–10.5)

## 2014-10-26 LAB — GLUCOSE, CAPILLARY
Glucose-Capillary: 146 mg/dL — ABNORMAL HIGH (ref 70–99)
Glucose-Capillary: 165 mg/dL — ABNORMAL HIGH (ref 70–99)
Glucose-Capillary: 134 mg/dL — ABNORMAL HIGH (ref 70–99)
Glucose-Capillary: 164 mg/dL — ABNORMAL HIGH (ref 70–99)

## 2014-10-26 LAB — BASIC METABOLIC PANEL
Anion gap: 10 (ref 5–15)
BUN: 20 mg/dL (ref 6–23)
CO2: 15 mmol/L — ABNORMAL LOW (ref 19–32)
Calcium: 8.3 mg/dL — ABNORMAL LOW (ref 8.4–10.5)
Chloride: 110 mEq/L (ref 96–112)
Creatinine, Ser: 1.51 mg/dL — ABNORMAL HIGH (ref 0.50–1.35)
GFR calc Af Amer: 49 mL/min — ABNORMAL LOW (ref >90)
GFR calc non Af Amer: 42 mL/min — ABNORMAL LOW (ref >90)
Glucose, Bld: 167 mg/dL — ABNORMAL HIGH (ref 70–99)
Potassium: 4.6 mmol/L (ref 3.5–5.1)
Sodium: 135 mmol/L (ref 135–145)

## 2014-10-26 MED ORDER — SODIUM CHLORIDE 0.9 % IV SOLN
Freq: Once | INTRAVENOUS | Status: AC
  Administered 2014-10-26: 03:00:00 via INTRAVENOUS

## 2014-10-26 MED ORDER — AMIODARONE HCL 200 MG PO TABS
200.0000 mg | ORAL_TABLET | Freq: Every day | ORAL | Status: DC
  Administered 2014-10-26 – 2014-10-27 (×2): 200 mg via ORAL
  Filled 2014-10-26 (×2): qty 1

## 2014-10-26 NOTE — Progress Notes (Signed)
Patient Name: [name] [name] Date of Encounter: 10/26/2014  Principal Problem:   Symptomatic anemia Active Problems:   Diabetes   Ulcerative colitis   A-fib   SOB (shortness of breath)   CAD in native artery- total LAD, s/p RI DES 06/19/14   Dyslipidemia   Abdominal pain, generalized   Ureteral stone with hydronephrosis   ARF (acute renal failure)   Hypotension   Bleeding gastrointestinal   Shortness of breath   Length of Stay: 4  SUBJECTIVE  The patient feels tired.   CURRENT MEDS . aspirin  81 mg Oral Daily  . clopidogrel  75 mg Oral Daily  . ferrous sulfate  325 mg Oral BID WC  . insulin aspart  0-9 Units Subcutaneous TID WC  . linagliptin  5 mg Oral Daily  . metoprolol  12.5 mg Oral BID  . pantoprazole  40 mg Oral BID  . simvastatin  20 mg Oral q1800  . sodium chloride  3 mL Intravenous Q12H  . tamsulosin  0.4 mg Oral QPC supper   OBJECTIVE  Filed Vitals:   10/26/14 0156 10/26/14 0200 10/26/14 0500 10/26/14 1004  BP: 90/55 88/52 127/68 106/42  Pulse: 109 112 98 72  Temp:   98 F (36.7 C)   TempSrc:   Oral   Resp:   18 18  Height:      Weight:   243 lb 13.3 oz (110.6 kg)   SpO2:   100% 100%    Intake/Output Summary (Last 24 hours) at 10/26/14 1338 Last data filed at 10/26/14 0820  Gross per 24 hour  Intake   1460 ml  Output   1650 ml  Net   -190 ml   Filed Weights   10/23/14 0700 10/25/14 1633 10/26/14 0500  Weight: 245 lb 8 oz (111.358 kg) 246 lb (111.585 kg) 243 lb 13.3 oz (110.6 kg)   PHYSICAL EXAM  General: Pleasant, NAD. Neuro: Alert and oriented X 3. Moves all extremities spontaneously. Psych: Normal affect. HEENT:  Normal  Neck: Supple without bruits or JVD. Lungs:  Resp regular and unlabored, CTA. Heart: RRR no s3, s4, or murmurs. Abdomen: Soft, non-tender, non-distended, BS + x 4.  Extremities: No clubbing, cyanosis or edema. DP/PT/Radials 2+ and equal bilaterally.  Accessory Clinical Findings  CBC  Recent Labs   10/25/14 0450 10/26/14 0504  WBC 9.5 9.4  HGB 8.7* 9.5*  HCT 27.9* 30.7*  MCV 79.7 80.8  PLT 268 127   Basic Metabolic Panel  Recent Labs  10/25/14 0450 10/26/14 0504  NA 132* 135  K 4.2 4.6  CL 108 110  CO2 18* 15*  GLUCOSE 157* 167*  BUN 21 20  CREATININE 1.23 1.51*  CALCIUM 8.0* 8.3*    Recent Labs  10/24/14 1930  TSH 1.419   Radiology/Studies  Dg Chest 2 View  10/22/2014   CLINICAL DATA:  Chronic shortness of breath.  EXAM: CHEST  2 VIEW  COMPARISON:  CT 06/03/2014.  Chest x-ray 01/05/2014.  FINDINGS: Bilateral of the lines noted in good anatomic position. Cardiomegaly with normal pulmonary vascularity. Mild bibasilar subsegmental atelectasis and/or scarring. No pleural effusion or pneumothorax. Stable apical pleural thickening consistent scarring. No acute bony abnormality. Stable prominent compression fracture upper lumbar vertebral body. Prior vertebroplasty .  IMPRESSION: 1. Stable cardiomegaly, no CHF. 2. Mild bibasilar subsegmental atelectasis and/or scar.     TELE: SR    ASSESSMENT AND PLAN  1. Microcytic anemia, GIB - on dual antiplatelet therapy, Heme + stool  and Fe deficiency anemia. He received transfusion of PRBC, Hb increased from 7.6 to 8.7 and today to 9.5. Fe replacement. Recommend IV Fe infusion in hospital.  GI recommended PPI bid for now and PPI qd at discharge and since he is not actively bleeding they are planning for an outpatient EGD and ileoscopy per ostomy off Plavix for 5 days with Dr. Scarlette Shorts.  - the patient is only 3 months after DES placement, the risk of in stent restenosis is very high and we don't recommend to stop plavix if at all possible, but rather wait another 3 months. If necessary hold aspirin and continue plavix as monotherapy. - since patient had a recent cath we would prefer Hb > 10  2. New onset a-fib with RVR - soft BP, we started amiodarone drip yesterday, unable to start anticoagulation as he has GIB. He cardioverted  overnight, we will switch to PO amiodarone.   3. CAD in native artery- total LAD, s/p RI DES 06/19/14 Recent work up for [place] included an abnormal Myoview. Subsequent elective cath showed total LAD and RI disease. He underwent RI DES. Continue DAPT with ASA and Plavix.  Continue metoprolol (low dose as BP soft) and simvastatin  4. Obesity Sleep study was cancelled since no evidence of pulmonary HTN on cath  5. Benign essential HTN BP soft with acute blood loss, amlodipine was discontinued, continue low dose metoprolol  6. Dyslipidemia - continue Zocor. Check LFTs and ALT  At the discharge we will arrange a follow up appointment with Dr Radford Pax.   Signed, Dorothy Spark MD, [facility] 10/26/2014

## 2014-10-26 NOTE — Progress Notes (Signed)
PROGRESS NOTE  [name] [name] PNT:[id number] DOB: 1933/01/30 DOA: 10/22/2014 PCP: [name], MD  HPI/Recap of past 24 hours:  Converted to sinus rhythm around 5am while on amio drip, feeling better, multiple family members in room.  Assessment/Plan: Principal Problem:   Symptomatic anemia Active Problems:   Diabetes   Ulcerative colitis   A-fib   SOB (shortness of breath)   CAD in native artery- total LAD, s/p RI DES 06/19/14   Dyslipidemia   Abdominal pain, generalized   Ureteral stone with hydronephrosis   ARF (acute renal failure)   Hypotension   Bleeding gastrointestinal   Shortness of breath  --afib/rvr: cardiology consult appreciated, amio drip started, converted to sinus rhythm around 5am. Not a candidate for anticoagulation due to gi bleed/symptomatic anemia.  --Symptomatic anemia with DOE/weakenss. s/p prbcx2. On overt blood loss, h/h stable.  Iron deficiency. Start ferrous sulfate. b12 low normal, will start b12 supplement as well. plavix continued due to recent cardiac stent placement. (06/2014). GI consult appreciated.  -- Hypotension: from hypovolemia/aifb? Improving. Continue Hold norvasc,on decreased dose of betablocker. blood culture no growth. ua no infection. am cortisol level/tsh level/lactic acid wnl.  -- ARF/right ureteral stone/mild right sided hydronephrosis:stone passed, cr back to baseline. ivf stopped per patinent's request, continue hold lasix. appreciate Urology consult. flomax started.  -- H/o UC s/p colectomy/diverting colostomy. Stool occult blood positive but not overt bleeding. GI consult appreciated.  -- FTT, PT/discharge planning  Code Status: full code  Family Communication: patient and sisters  Disposition Plan: possible discharge on monday   Consultants:  GI/urology/cardiology  Procedures:  none  Antibiotics:  none   Objective: BP 100/35 mmHg  Pulse 64  Temp(Src) 97.2 F (36.2 C) (Oral)  Resp 18  Ht 5' 6"  (1.676  m)  Wt 110.6 kg (243 lb 13.3 oz)  BMI 39.37 kg/m2  SpO2 99%  Intake/Output Summary (Last 24 hours) at 10/26/14 1937 Last data filed at 10/26/14 1732  Gross per 24 hour  Intake 2201.95 ml  Output    900 ml  Net 1301.95 ml   Filed Weights   10/23/14 0700 10/25/14 1633 10/26/14 0500  Weight: 111.358 kg (245 lb 8 oz) 111.585 kg (246 lb) 110.6 kg (243 lb 13.3 oz)    Exam:   General:  Frail, nad  Cardiovascular: RRR  Respiratory: CTABL  Abdomen: well healed midline surgical scar. Diverting colostomy with brown color fecal matters.  Musculoskeletal: no edema  Data Reviewed: Basic Metabolic Panel:  Recent Labs Lab 10/22/14 1029 10/23/14 0028 10/24/14 0525 10/25/14 0450 10/26/14 0504  NA 136 136 135 132* 135  K 5.0 4.7 4.5 4.2 4.6  CL 106 108 111 108 110  CO2 19 20 20  18* 15*  GLUCOSE 165* 126* 146* 157* 167*  BUN 46* 47* 30* 21 20  CREATININE 2.27* 2.56* 1.44* 1.23 1.51*  CALCIUM 9.0 8.5 7.9* 8.0* 8.3*   Liver Function Tests:  Recent Labs Lab 10/22/14 1029  AST 21  ALT 9  ALKPHOS 133*  BILITOT 0.7  PROT 7.2  ALBUMIN 3.2*   No results for input(s): LIPASE, AMYLASE in the last 168 hours. No results for input(s): AMMONIA in the last 168 hours. CBC:  Recent Labs Lab 10/22/14 1029 10/23/14 0028 10/24/14 0525 10/25/14 0450 10/26/14 0504  WBC 12.3* 9.1  8.7 7.9 9.5 9.4  NEUTROABS 9.9*  --   --   --   --   HGB 8.8* 8.1*  8.3* 7.6* 8.7* 9.5*  HCT 29.0* 26.0*  26.5* 25.2* 27.9* 30.7*  MCV 79.9 80.5  80.5 80.0 79.7 80.8  PLT 414* 336  293 288 268 286   Cardiac Enzymes:    Recent Labs Lab 10/22/14 1029  TROPONINI <0.03   BNP (last 3 results)  Recent Labs  06/17/14 1237 10/14/14 1111  PROBNP 315.0* 459.0*   CBG:  Recent Labs Lab 10/25/14 1715 10/25/14 2055 10/26/14 0750 10/26/14 1207 10/26/14 1639  GLUCAP 203* 167* 146* 165* 134*    Recent Results (from the past 240 hour(s))  Urine culture     Status: None   Collection Time:  10/22/14 10:52 AM  Result Value Ref Range Status   Specimen Description URINE, CLEAN CATCH  Final   Special Requests NONE  Final   Colony Count   Final    7,000 COLONIES/ML Performed at [facility]    Culture   Final    INSIGNIFICANT GROWTH Performed at [facility]    Report Status 10/23/2014 FINAL  Final  Culture, blood (routine x 2)     Status: None (Preliminary result)   Collection Time: 10/24/14  7:20 PM  Result Value Ref Range Status   Specimen Description BLOOD LEFT ARM  Final   Special Requests BOTTLES DRAWN AEROBIC AND ANAEROBIC 5CC  Final   Culture   Final           BLOOD CULTURE RECEIVED NO GROWTH TO DATE CULTURE WILL BE HELD FOR 5 DAYS BEFORE ISSUING A FINAL NEGATIVE REPORT Performed at [facility]    Report Status PENDING  Incomplete  Culture, blood (routine x 2)     Status: None (Preliminary result)   Collection Time: 10/24/14  7:30 PM  Result Value Ref Range Status   Specimen Description BLOOD RIGHT ARM  Final   Special Requests BOTTLES DRAWN AEROBIC AND ANAEROBIC 5CC  Final   Culture   Final           BLOOD CULTURE RECEIVED NO GROWTH TO DATE CULTURE WILL BE HELD FOR 5 DAYS BEFORE ISSUING A FINAL NEGATIVE REPORT Performed at [facility]    Report Status PENDING  Incomplete     Studies: No results found.  Scheduled Meds: . amiodarone  200 mg Oral Daily  . aspirin  81 mg Oral Daily  . clopidogrel  75 mg Oral Daily  . ferrous sulfate  325 mg Oral BID WC  . insulin aspart  0-9 Units Subcutaneous TID WC  . linagliptin  5 mg Oral Daily  . metoprolol  12.5 mg Oral BID  . pantoprazole  40 mg Oral BID  . simvastatin  20 mg Oral q1800  . sodium chloride  3 mL Intravenous Q12H  . tamsulosin  0.4 mg Oral QPC supper    Continuous Infusions: stopped       [facility] MD/PhD  [facility] Pager [phone]. If 7PM-7AM, please contact night-coverage at [email or web address], password [facility] 10/26/2014, 7:37 PM  LOS: 4 days

## 2014-10-27 DIAGNOSIS — K2961 Other gastritis with bleeding: Secondary | ICD-10-CM

## 2014-10-27 LAB — BASIC METABOLIC PANEL
Anion gap: 6 (ref 5–15)
BUN: 20 mg/dL (ref 6–23)
CO2: 21 mmol/L (ref 19–32)
Calcium: 8.2 mg/dL — ABNORMAL LOW (ref 8.4–10.5)
Chloride: 107 mEq/L (ref 96–112)
Creatinine, Ser: 1.34 mg/dL (ref 0.50–1.35)
GFR calc Af Amer: 56 mL/min — ABNORMAL LOW (ref >90)
GFR calc non Af Amer: 49 mL/min — ABNORMAL LOW (ref >90)
Glucose, Bld: 166 mg/dL — ABNORMAL HIGH (ref 70–99)
Potassium: 4.8 mmol/L (ref 3.5–5.1)
Sodium: 134 mmol/L — ABNORMAL LOW (ref 135–145)

## 2014-10-27 LAB — CBC
HCT: 28.7 % — ABNORMAL LOW (ref 39.0–52.0)
Hemoglobin: 8.9 g/dL — ABNORMAL LOW (ref 13.0–17.0)
MCH: 25.1 pg — ABNORMAL LOW (ref 26.0–34.0)
MCHC: 31 g/dL (ref 30.0–36.0)
MCV: 81.1 fL (ref 78.0–100.0)
Platelets: 269 10*3/uL (ref 150–400)
RBC: 3.54 MIL/uL — ABNORMAL LOW (ref 4.22–5.81)
RDW: 15.9 % — ABNORMAL HIGH (ref 11.5–15.5)
WBC: 8.4 10*3/uL (ref 4.0–10.5)

## 2014-10-27 LAB — GLUCOSE, CAPILLARY
Glucose-Capillary: 147 mg/dL — ABNORMAL HIGH (ref 70–99)
Glucose-Capillary: 177 mg/dL — ABNORMAL HIGH (ref 70–99)

## 2014-10-27 MED ORDER — FUROSEMIDE 20 MG PO TABS: 20.0000 mg | ORAL_TABLET | ORAL | Status: DC | PRN

## 2014-10-27 MED ORDER — TAMSULOSIN HCL 0.4 MG PO CAPS: 0.4000 mg | ORAL_CAPSULE | Freq: Every day | ORAL | Status: DC

## 2014-10-27 MED ORDER — FERROUS SULFATE 325 (65 FE) MG PO TABS: 325.0000 mg | ORAL_TABLET | Freq: Two times a day (BID) | ORAL | Status: DC

## 2014-10-27 MED ORDER — METOPROLOL TARTRATE 25 MG PO TABS
12.5000 mg | ORAL_TABLET | Freq: Two times a day (BID) | ORAL | Status: DC
Start: 2014-10-27 — End: 2014-12-26

## 2014-10-27 MED ORDER — AMIODARONE HCL 200 MG PO TABS: 200.0000 mg | ORAL_TABLET | Freq: Every day | ORAL | Status: DC

## 2014-10-27 NOTE — Progress Notes (Signed)
DC IV, DC Tele, DC Home. Discharge instructions and home medications discussed with patient and patient's family members. Patient and family denied any questions or concerns at this time. Patient leaving unit via wheelchair and appears in no acute distress.

## 2014-10-27 NOTE — Progress Notes (Signed)
Patient Name: [name] [name] Date of Encounter: 10/27/2014  Principal Problem:   Symptomatic anemia Active Problems:   Diabetes   Ulcerative colitis   A-fib   SOB (shortness of breath)   CAD in native artery- total LAD, s/p RI DES 06/19/14   Dyslipidemia   Abdominal pain, generalized   Ureteral stone with hydronephrosis   ARF (acute renal failure)   Hypotension   Bleeding gastrointestinal   Shortness of breath   Length of Stay: 5  SUBJECTIVE  The patient feels good. Wants to go home.    CURRENT MEDS . amiodarone  200 mg Oral Daily  . aspirin  81 mg Oral Daily  . clopidogrel  75 mg Oral Daily  . ferrous sulfate  325 mg Oral BID WC  . insulin aspart  0-9 Units Subcutaneous TID WC  . linagliptin  5 mg Oral Daily  . metoprolol  12.5 mg Oral BID  . pantoprazole  40 mg Oral BID  . simvastatin  20 mg Oral q1800  . sodium chloride  3 mL Intravenous Q12H  . tamsulosin  0.4 mg Oral QPC supper   OBJECTIVE  Filed Vitals:   10/26/14 1511 10/26/14 2100 10/27/14 0647 10/27/14 1050  BP: 100/35 113/49 126/64 125/69  Pulse: 64 68 73 76  Temp: 97.2 F (36.2 C) 98.9 F (37.2 C) 97.5 F (36.4 C)   TempSrc: Oral Oral Oral   Resp:   19   Height:      Weight:   246 lb 7.6 oz (111.8 kg)   SpO2: 99% 98% 97%     Intake/Output Summary (Last 24 hours) at 10/27/14 1243 Last data filed at 10/27/14 1110  Gross per 24 hour  Intake 1071.85 ml  Output   2025 ml  Net -953.15 ml   Filed Weights   10/25/14 1633 10/26/14 0500 10/27/14 0647  Weight: 246 lb (111.585 kg) 243 lb 13.3 oz (110.6 kg) 246 lb 7.6 oz (111.8 kg)   PHYSICAL EXAM  General: Pleasant, NAD. Neuro: Alert and oriented X 3. Moves all extremities spontaneously. Psych: Normal affect. HEENT:  Normal  Neck: Supple without bruits or JVD. Lungs:  Resp regular and unlabored, CTA. Heart: RRR no s3, s4, or murmurs. Abdomen: Soft, non-tender, non-distended, BS + x 4.  obese Extremities: No clubbing, cyanosis or edema.  DP/PT/Radials 2+ and equal bilaterally.  Accessory Clinical Findings  CBC  Recent Labs  10/26/14 0504 10/27/14 0340  WBC 9.4 8.4  HGB 9.5* 8.9*  HCT 30.7* 28.7*  MCV 80.8 81.1  PLT 286 944   Basic Metabolic Panel  Recent Labs  10/26/14 0504 10/27/14 0340  NA 135 134*  K 4.6 4.8  CL 110 107  CO2 15* 21  GLUCOSE 167* 166*  BUN 20 20  CREATININE 1.51* 1.34  CALCIUM 8.3* 8.2*    Recent Labs  10/24/14 1930  TSH 1.419   Radiology/Studies  Dg Chest 2 View  10/22/2014   CLINICAL DATA:  Chronic shortness of breath.  EXAM: CHEST  2 VIEW  COMPARISON:  CT 06/03/2014.  Chest x-ray 01/05/2014.  FINDINGS: Bilateral of the lines noted in good anatomic position. Cardiomegaly with normal pulmonary vascularity. Mild bibasilar subsegmental atelectasis and/or scarring. No pleural effusion or pneumothorax. Stable apical pleural thickening consistent scarring. No acute bony abnormality. Stable prominent compression fracture upper lumbar vertebral body. Prior vertebroplasty .  IMPRESSION: 1. Stable cardiomegaly, no CHF. 2. Mild bibasilar subsegmental atelectasis and/or scar.     TELE: SR  ASSESSMENT AND PLAN  1. Microcytic anemia, GIB - on dual antiplatelet therapy, Heme + stool and Fe deficiency anemia. He received transfusion of PRBC, Hb increased from 7.6 to 8.9. GI recommended PPI bid for now and PPI qd at discharge and since he is not actively bleeding they are planning for an outpatient EGD and ileoscopy per ostomy off Plavix for 5 days with Dr. Scarlette Shorts.  - the patient is only 3 months after DES placement, the risk of in stent restenosis is very high and we don't recommend to stop plavix if at all possible, but rather wait another 3 months. If necessary hold aspirin and continue plavix as monotherapy.   2. New onset a-fib with RVR - soft BP, unable to start anticoagulation as he has GIB. He cardioverted on amiodarone. Continue for now, 286m QD. PVC noted on monitor.  3. CAD  in native artery- total LAD, s/p RI DES 06/19/14 Recent work up for [place] an abnormal Myoview. Subsequent elective cath showed total LAD and RI disease. He underwent RI DES. Continue DAPT with ASA and Plavix.  Continue metoprolol (low dose as BP soft) and simvastatin  4. Obesity Sleep study was cancelled since no evidence of pulmonary HTN on cath. Contributing to AFIB.   5. Benign essential HTN BP soft with acute blood loss, amlodipine was discontinued, continue low dose metoprolol  6. Dyslipidemia - continue Zocor. Check LFTs and ALT  At the discharge we will arrange a follow up appointment with Dr TRadford Pax One week. [facility]. Called Trish to make appt.   OK to DC home.   SBobby RumpfMD, FOrthoarkansas Surgery Center LLC1/08/2015

## 2014-10-27 NOTE — Discharge Summary (Signed)
Discharge Summary  [name] [name]:[id number] DOB: [birth year]  PCP: [name], MD  Admit date: 10/22/2014 Discharge date: 10/27/2014  Time spent: >79mns  Recommendations for Outpatient Follow-up:  1. Cardiology/GI/urology/pmd 2. Home health/pt/rn  Discharge Diagnoses:  Active Hospital Problems   Diagnosis Date Noted  . Symptomatic anemia 10/22/2014  . Bleeding gastrointestinal   . Shortness of breath   . Hypotension 10/24/2014  . Ureteral stone with hydronephrosis 10/23/2014  . ARF (acute renal failure) 10/23/2014  . Abdominal pain, generalized 10/22/2014  . Dyslipidemia 10/14/2014  . CAD in native artery- total LAD, s/p RI DES 06/19/14 07/11/2014  . SOB (shortness of breath) 10/02/2013  . A-fib 10/02/2013  . Diabetes   . Ulcerative colitis     Resolved Hospital Problems   Diagnosis Date Noted Date Resolved  No resolved problems to display.    Discharge Condition: stable, improved  Diet recommendation: cardiac/diabetic diet  Filed Weights   10/25/14 1633 10/26/14 0500 10/27/14 0647  Weight: 111.585 kg (246 lb) 110.6 kg (243 lb 13.3 oz) 111.8 kg (246 lb 7.6 oz)    History of present illness:  [name] ENSMINGERis a 81y.o. male with past medical history including diabetes, ulcerative colitis status post remote partial colectomy, atrial fibrillation s/p ablation and CAD with recent stents on plavix/asa. Patient came to the hospital because of abdominal pain. Patient reported since he had his stents in September 2015 he was having shortness of breath, patient was taken Brillinta and he was asked to stop taking it and switched to Plavix. Patient also seen by his cardiologist Dr. TRadford Paxabout a week ago prior to admission and his BNP was in the high side so prescribed low dose of Lasix but he did not feel better with that. In the ED CT scan of abdomen/pelvis was done and showed small 3 mm stone with mild hydronephrosis on the right. Patient has acute renal failure with  creatinine of 2.27, his baseline about 1.0. Has acute anemia, his hemoglobin of 8.8 and last hemoglobin he had was 11.9. Fecal occult blood is positive but no overt bleeding. He was admitted to the hospitalist service for further management.  Hospital Course:  Principal Problem:   Symptomatic anemia Active Problems:   Diabetes   Ulcerative colitis   A-fib   SOB (shortness of breath)   CAD in native artery- total LAD, s/p RI DES 06/19/14   Dyslipidemia   Abdominal pain, generalized   Ureteral stone with hydronephrosis   ARF (acute renal failure)   Hypotension   Bleeding gastrointestinal   Shortness of breath  --afib/rvr: during hospitalization he developed afib/RVR with borderline blood pressure, cardiology consulted, he was started on  amio drip with conversion to sinus rhythm around 5am next morning. Not a candidate for anticoagulation due to gi bleed/symptomatic anemia. He is discharged lopressor 12.5bid and amiodarone 2063mpo qd and cardiology f/u in 1wk.  --Symptomatic anemia with DOE/weakenss. s/p prbcx2. On overt blood loss, h/h stable. Iron deficiency. Start ferrous sulfate. b12 low normal. plavix continued due to recent cardiac stent placement. (06/2014). GI consult appreciated. With h/o ulcerative colitis s/p colostomy. Will need to f/u with GI, consider further work up once off plavix.  -- Hypotension: improved at time of discharge, likely combination of hypovolemia/aifb. D/ced norvasc,on decreased dose of betablocker. blood culture no growth. ua no infection. am cortisol level/tsh level/lactic acid wnl.  -- ARF/right ureteral stone/mild right sided hydronephrosis:stone passed, cr back to baseline. Received ivf.lasix was on hold during hosptalization, changed  to prn for edema at discharge. appreciate Urology consult. flomax started.  -- H/o UC s/p colectomy/diverting colostomy. Stool occult blood positive but not overt bleeding. GI consult appreciated.  -- FTT, PT/discharge  planning  Procedures:  none  Consultations:  Cardiology/gi/urology  Discharge Exam: BP 125/69 mmHg  Pulse 76  Temp(Src) 97.5 F (36.4 C) (Oral)  Resp 19  Ht 5' 6"  (1.676 m)  Wt 111.8 kg (246 lb 7.6 oz)  BMI 39.80 kg/m2  SpO2 97%  General: AAOx3 Cardiovascular: back to nsr with ectopic beats Respiratory: CTABl Ab: no pain, functioning colostomy.  Discharge Instructions You were cared for by a hospitalist during your hospital stay. If you have any questions about your discharge medications or the care you received while you were in the hospital after you are discharged, you can call the unit and asked to speak with the hospitalist on call if the hospitalist that took care of you is not available. Once you are discharged, your primary care physician will handle any further medical issues. Please note that NO REFILLS for any discharge medications will be authorized once you are discharged, as it is imperative that you return to your primary care physician (or establish a relationship with a primary care physician if you do not have one) for your aftercare needs so that they can reassess your need for medications and monitor your lab values.  Discharge Instructions    Diet - low sodium heart healthy    Complete by:  As directed      Face-to-face encounter (required for Medicare/Medicaid patients)    Complete by:  As directed   I Hadar Elgersma certify that this patient is under my care and that I, or a nurse practitioner or physician's assistant working with me, had a face-to-face encounter that meets the physician face-to-face encounter requirements with this patient on 10/27/2014. The encounter with the patient was in whole, or in part for the following medical condition(s) which is the primary reason for home health care (List medical condition):symptomatic anemia/arf/afib/rvr  The encounter with the patient was in whole, or in part, for the following medical condition, which is the primary  reason for home health care:  symptomatic anemia/arf/afib/rvr  I certify that, based on my findings, the following services are medically necessary home health services:   NursingPhysical therapy    Reason for Medically Necessary Home Health Services:   Skilled Nursing- Change/Decline in Patient StatusTherapy- Home Adaptation to Facilitate Safety    My clinical findings support the need for the above services:  Shortness of breath with activity  Further, I certify that my clinical findings support that this patient is homebound due to:  Shortness of Breath with activity     Home Health    Complete by:  As directed   To provide the following care/treatments:   PTRN       Increase activity slowly    Complete by:  As directed             Medication List    STOP taking these medications        amLODipine 5 MG tablet  Commonly known as:  NORVASC     pioglitazone 30 MG tablet  Commonly known as:  ACTOS      TAKE these medications        amiodarone 200 MG tablet  Commonly known as:  PACERONE  Take 1 tablet (200 mg total) by mouth daily.     aspirin 81 MG tablet  Take  81 mg by mouth daily.     calcium carbonate 600 MG Tabs tablet  Commonly known as:  OS-CAL  Take 600 mg by mouth daily with breakfast.     clopidogrel 75 MG tablet  Commonly known as:  PLAVIX  Take 1 tablet (75 mg total) by mouth daily.     ferrous sulfate 325 (65 FE) MG tablet  Take 1 tablet (325 mg total) by mouth 2 (two) times daily with a meal.     furosemide 20 MG tablet  Commonly known as:  LASIX  Take 1 tablet (20 mg total) by mouth every other day as needed for fluid or edema. Take 40 mg your first two days.     glyBURIDE-metformin 5-500 MG per tablet  Commonly known as:  GLUCOVANCE  Take 1 tablet by mouth 2 (two) times daily with a meal. Hold for 48 hours, restart on 06/22/2014     metoprolol tartrate 25 MG tablet  Commonly known as:  LOPRESSOR  Take 0.5 tablets (12.5 mg total) by mouth 2  (two) times daily.     ranitidine 75 MG tablet  Commonly known as:  ZANTAC  Take 150 mg by mouth 2 (two) times daily. Patient unsure of exact dosage     saxagliptin HCl 5 MG Tabs tablet  Commonly known as:  ONGLYZA  Take 1 tablet (5 mg total) by mouth daily.     simvastatin 20 MG tablet  Commonly known as:  ZOCOR  Take 20 mg by mouth daily.     tamsulosin 0.4 MG Caps capsule  Commonly known as:  FLOMAX  Take 1 capsule (0.4 mg total) by mouth daily after supper.       Allergies  Allergen Reactions  . Penicillins Rash  . Sulfa Antibiotics Rash       Follow-up Information    Follow up with [facility].   Why:  hhpt, hhrn   Contact information:   [address] [place] [address] [phone]       Follow up with [name], PA-C On 11/05/2014.   Specialty:  Cardiology   Why:  8:30am. Transition of Care followup   Contact information:   [address] [place] [address] [phone]       Follow up with Sueanne Margarita, MD In 1 week.   Specialty:  Cardiology   Contact information:   [address] [place] [address] [phone]       Follow up with Dutch Gray, MD In 3 weeks.   Specialty:  Urology   Contact information:   [place] [address] [phone]       Follow up with Norberto Sorenson T. Fuller Plan, MD In 2 weeks.   Specialty:  Gastroenterology   Contact information:   [address] [place] [address] [phone]        The results of significant diagnostics from this hospitalization (including imaging, microbiology, ancillary and laboratory) are listed below for reference.    Significant Diagnostic Studies: Ct Abdomen Pelvis Wo Contrast  10/22/2014   CLINICAL DATA:  Acute abdominal pain. Dizziness and hyperglycemia. History of ulcerative colitis, small-bowel obstruction, peptic ulcer disease, kidney stone, and chronic kidney disease.  EXAM: CT ABDOMEN AND PELVIS WITHOUT  CONTRAST  TECHNIQUE: Multidetector CT imaging of the abdomen and pelvis was performed following the standard protocol without IV contrast.  COMPARISON:  06/01/2010  FINDINGS: Subsegmental atelectasis is present in the visualized lung bases.  Coronary artery calcification is partially visualized.  The liver, gallbladder, spleen, and adrenal glands have an unremarkable unenhanced appearance. There is fatty infiltration of the pancreas. There is a 3 mm calculus in the proximal right ureter with mild right hydroureteronephrosis and perinephric stranding. Both kidneys demonstrate atrophy. A nonobstructing stone is again seen in the lower pole of the left kidney measuring 6 mm, slightly larger than on the prior study. No left ureteral stones are seen.  Bladder is grossly unremarkable. Sequelae of prior colectomy are again identified with a right lower quadrant ileostomy remaining in place. Oral contrast is present in loops of nondilated small bowel to the level of the ileostomy without evidence of obstruction. A fat containing ventral abdominal hernia is again seen to the right of midline medial to the ileostomy. Moderate atherosclerotic vascular calcification is noted. No free fluid or enlarged lymph nodes are identified. Chronic L1 compression fracture is noted status post vertebral augmentation.  IMPRESSION: 1. 3 mm proximal right ureteral stone with mild right hydroureteronephrosis. 2. Nonobstructing left nephrolithiasis.   Electronically Signed   By: Logan Bores   On: 10/22/2014 14:43   Dg Chest 2 View  10/22/2014   CLINICAL DATA:  Chronic shortness of breath.  EXAM: CHEST  2 VIEW  COMPARISON:  CT 06/03/2014.  Chest x-ray 01/05/2014.  FINDINGS: Bilateral of the lines noted in good anatomic position. Cardiomegaly with normal pulmonary vascularity. Mild bibasilar subsegmental atelectasis and/or scarring. No pleural effusion or pneumothorax. Stable apical pleural thickening consistent scarring. No acute bony  abnormality. Stable prominent compression fracture upper lumbar vertebral body. Prior vertebroplasty .  IMPRESSION: 1. Stable cardiomegaly, no CHF. 2. Mild bibasilar subsegmental atelectasis and/or scar.   Electronically Signed   By: Marcello Moores  Register   On: 10/22/2014 12:01    Microbiology: Recent Results (from the past 240 hour(s))  Urine culture     Status: None   Collection Time: 10/22/14 10:52 AM  Result Value Ref Range Status   Specimen Description URINE, CLEAN CATCH  Final   Special Requests NONE  Final   Colony Count   Final    7,000 COLONIES/ML Performed at [facility]    Culture   Final    INSIGNIFICANT GROWTH Performed at [facility]    Report Status 10/23/2014 FINAL  Final  Culture, blood (routine x 2)     Status: None (Preliminary result)   Collection Time: 10/24/14  7:20 PM  Result Value Ref Range Status   Specimen Description BLOOD LEFT ARM  Final   Special Requests BOTTLES DRAWN AEROBIC AND ANAEROBIC 5CC  Final   Culture   Final           BLOOD CULTURE RECEIVED NO GROWTH TO DATE CULTURE WILL BE HELD FOR 5 DAYS BEFORE ISSUING A FINAL NEGATIVE REPORT Performed at [facility]    Report Status PENDING  Incomplete  Culture, blood (routine x 2)     Status: None (Preliminary result)   Collection Time: 10/24/14  7:30 PM  Result Value Ref Range Status   Specimen Description BLOOD RIGHT ARM  Final   Special Requests BOTTLES DRAWN AEROBIC AND ANAEROBIC 5CC  Final   Culture   Final           BLOOD CULTURE RECEIVED NO GROWTH TO DATE CULTURE WILL BE HELD FOR 5 DAYS BEFORE ISSUING A FINAL NEGATIVE REPORT Performed at [facility]    Report Status PENDING  Incomplete     Labs: Basic Metabolic Panel:  Recent Labs Lab 10/23/14 0028 10/24/14 0525 10/25/14 0450 10/26/14 0504 10/27/14 0340  NA 136 135 132* 135 134*  K 4.7 4.5 4.2 4.6 4.8  CL 108 111 108 110 107  CO2 20 20 18* 15* 21  GLUCOSE 126* 146* 157* 167* 166*  BUN 47* 30* 21  20 20   CREATININE 2.56* 1.44* 1.23 1.51* 1.34  CALCIUM 8.5 7.9* 8.0* 8.3* 8.2*   Liver Function Tests:  Recent Labs Lab 10/22/14 1029  AST 21  ALT 9  ALKPHOS 133*  BILITOT 0.7  PROT 7.2  ALBUMIN 3.2*   No results for input(s): LIPASE, AMYLASE in the last 168 hours. No results for input(s): AMMONIA in the last 168 hours. CBC:  Recent Labs Lab 10/22/14 1029 10/23/14 0028 10/24/14 0525 10/25/14 0450 10/26/14 0504 10/27/14 0340  WBC 12.3* 9.1  8.7 7.9 9.5 9.4 8.4  NEUTROABS 9.9*  --   --   --   --   --   HGB 8.8* 8.1*  8.3* 7.6* 8.7* 9.5* 8.9*  HCT 29.0* 26.0*  26.5* 25.2* 27.9* 30.7* 28.7*  MCV 79.9 80.5  80.5 80.0 79.7 80.8 81.1  PLT 414* 336  293 288 268 286 269   Cardiac Enzymes:  Recent Labs Lab 10/22/14 1029  TROPONINI <0.03   BNP: BNP (last 3 results)  Recent Labs  06/17/14 1237 10/14/14 1111  PROBNP 315.0* 459.0*   CBG:  Recent Labs Lab 10/26/14 1207 10/26/14 1639 10/26/14 2156 10/27/14 0550 10/27/14 1112  GLUCAP 165* 134* 164* 147* 177*       Signed:  Shauntavia Brackin  [facility] 10/27/2014, 2:06 PM

## 2014-10-28 ENCOUNTER — Telehealth: Payer: Self-pay

## 2014-10-28 DIAGNOSIS — I2510 Atherosclerotic heart disease of native coronary artery without angina pectoris: Secondary | ICD-10-CM | POA: Diagnosis not present

## 2014-10-28 DIAGNOSIS — I4891 Unspecified atrial fibrillation: Secondary | ICD-10-CM | POA: Diagnosis not present

## 2014-10-28 DIAGNOSIS — K922 Gastrointestinal hemorrhage, unspecified: Secondary | ICD-10-CM | POA: Diagnosis not present

## 2014-10-28 DIAGNOSIS — N132 Hydronephrosis with renal and ureteral calculous obstruction: Secondary | ICD-10-CM | POA: Diagnosis not present

## 2014-10-28 DIAGNOSIS — R627 Adult failure to thrive: Secondary | ICD-10-CM | POA: Diagnosis not present

## 2014-10-28 DIAGNOSIS — E119 Type 2 diabetes mellitus without complications: Secondary | ICD-10-CM | POA: Diagnosis not present

## 2014-10-28 DIAGNOSIS — D649 Anemia, unspecified: Secondary | ICD-10-CM | POA: Diagnosis not present

## 2014-10-28 DIAGNOSIS — K5190 Ulcerative colitis, unspecified, without complications: Secondary | ICD-10-CM | POA: Diagnosis not present

## 2014-10-28 DIAGNOSIS — Z933 Colostomy status: Secondary | ICD-10-CM | POA: Diagnosis not present

## 2014-10-28 LAB — STONE ANALYSIS: Stone Weight KSTONE: 0.042 g

## 2014-10-28 NOTE — Telephone Encounter (Signed)
Pt scheduled to see Dr. Henrene Pastor 11/27/14@9am . Pt aware of appt.

## 2014-10-28 NOTE — Telephone Encounter (Signed)
Instructed patient to see his PCP.  Informed him TED stockings are being ordered for him to pick up. Instructed him to hold his Lasix for 2 days and then only use PRN for edema.  He is to call the office next week to F/U.  Nurse Cyndi Bender also notified of new instructions.

## 2014-10-28 NOTE — Telephone Encounter (Signed)
He needs to see his PCP - he was just discharged from hospital with GI bleed

## 2014-10-28 NOTE — Telephone Encounter (Signed)
-----   Message from Ladene Artist, MD sent at 10/25/2014  9:15 AM EST ----- This is a patient of Dr. Blanch Media who needs an office appt very soon following discharge from [facility] to arrange EGD/ileoscopy. Has iron deficiency anemia and heme + stool on Plavix.

## 2014-10-28 NOTE — Telephone Encounter (Signed)
Nurse Cyndi Bender (from [facility]) calling from patient's home.  She st the patient c/o chronic mild to moderate SOB and intermittent dizziness (this was a problem at a recent hospitalization that has not resolved). She said this morning, she took the patient's BP and HR:  Sitting: 149/69 HR 75  Standing: 82/59 HR 87 She st patient was completely asymptomatic at this time.  Informed Nurse Cyndi Bender that Dr. Radford Pax will be notified and both Nurse Cyndi Bender and the patient will be called with further instruction.  Instructed both Nurse Cyndi Bender and [name] [name] to make slow movements and to drink a Gatorade in the meantime.  To Dr. Radford Pax for review.

## 2014-10-28 NOTE — Telephone Encounter (Signed)
Please order TED hose stockings and also hold Lasix for 2 days and then use PRN for edema.  If symptoms do not improve call and let us know.

## 2014-10-29 DIAGNOSIS — N132 Hydronephrosis with renal and ureteral calculous obstruction: Secondary | ICD-10-CM | POA: Diagnosis not present

## 2014-10-29 DIAGNOSIS — D649 Anemia, unspecified: Secondary | ICD-10-CM | POA: Diagnosis not present

## 2014-10-29 DIAGNOSIS — K922 Gastrointestinal hemorrhage, unspecified: Secondary | ICD-10-CM | POA: Diagnosis not present

## 2014-10-29 DIAGNOSIS — E119 Type 2 diabetes mellitus without complications: Secondary | ICD-10-CM | POA: Diagnosis not present

## 2014-10-29 DIAGNOSIS — I2510 Atherosclerotic heart disease of native coronary artery without angina pectoris: Secondary | ICD-10-CM | POA: Diagnosis not present

## 2014-10-29 DIAGNOSIS — K5190 Ulcerative colitis, unspecified, without complications: Secondary | ICD-10-CM | POA: Diagnosis not present

## 2014-10-31 DIAGNOSIS — E119 Type 2 diabetes mellitus without complications: Secondary | ICD-10-CM | POA: Diagnosis not present

## 2014-10-31 DIAGNOSIS — K5190 Ulcerative colitis, unspecified, without complications: Secondary | ICD-10-CM | POA: Diagnosis not present

## 2014-10-31 DIAGNOSIS — N132 Hydronephrosis with renal and ureteral calculous obstruction: Secondary | ICD-10-CM | POA: Diagnosis not present

## 2014-10-31 DIAGNOSIS — I2510 Atherosclerotic heart disease of native coronary artery without angina pectoris: Secondary | ICD-10-CM | POA: Diagnosis not present

## 2014-10-31 DIAGNOSIS — D649 Anemia, unspecified: Secondary | ICD-10-CM | POA: Diagnosis not present

## 2014-10-31 DIAGNOSIS — K922 Gastrointestinal hemorrhage, unspecified: Secondary | ICD-10-CM | POA: Diagnosis not present

## 2014-10-31 LAB — CULTURE, BLOOD (ROUTINE X 2)
Culture: NO GROWTH
Culture: NO GROWTH

## 2014-11-03 DIAGNOSIS — E1122 Type 2 diabetes mellitus with diabetic chronic kidney disease: Secondary | ICD-10-CM | POA: Diagnosis not present

## 2014-11-03 DIAGNOSIS — N200 Calculus of kidney: Secondary | ICD-10-CM | POA: Diagnosis not present

## 2014-11-03 DIAGNOSIS — I4891 Unspecified atrial fibrillation: Secondary | ICD-10-CM | POA: Diagnosis not present

## 2014-11-03 DIAGNOSIS — K922 Gastrointestinal hemorrhage, unspecified: Secondary | ICD-10-CM | POA: Diagnosis not present

## 2014-11-03 DIAGNOSIS — N183 Chronic kidney disease, stage 3 (moderate): Secondary | ICD-10-CM | POA: Diagnosis not present

## 2014-11-04 DIAGNOSIS — I2510 Atherosclerotic heart disease of native coronary artery without angina pectoris: Secondary | ICD-10-CM | POA: Diagnosis not present

## 2014-11-04 DIAGNOSIS — D649 Anemia, unspecified: Secondary | ICD-10-CM | POA: Diagnosis not present

## 2014-11-04 DIAGNOSIS — N132 Hydronephrosis with renal and ureteral calculous obstruction: Secondary | ICD-10-CM | POA: Diagnosis not present

## 2014-11-04 DIAGNOSIS — K922 Gastrointestinal hemorrhage, unspecified: Secondary | ICD-10-CM | POA: Diagnosis not present

## 2014-11-04 DIAGNOSIS — E119 Type 2 diabetes mellitus without complications: Secondary | ICD-10-CM | POA: Diagnosis not present

## 2014-11-04 DIAGNOSIS — K5190 Ulcerative colitis, unspecified, without complications: Secondary | ICD-10-CM | POA: Diagnosis not present

## 2014-11-05 ENCOUNTER — Ambulatory Visit (INDEPENDENT_AMBULATORY_CARE_PROVIDER_SITE_OTHER): Payer: Medicare Other | Admitting: Physician Assistant

## 2014-11-05 ENCOUNTER — Encounter: Payer: Self-pay | Admitting: Physician Assistant

## 2014-11-05 VITALS — BP 124/66 | HR 91 | Ht 68.0 in | Wt 247.0 lb

## 2014-11-05 DIAGNOSIS — N183 Chronic kidney disease, stage 3 unspecified: Secondary | ICD-10-CM

## 2014-11-05 DIAGNOSIS — K922 Gastrointestinal hemorrhage, unspecified: Secondary | ICD-10-CM | POA: Diagnosis not present

## 2014-11-05 DIAGNOSIS — I5032 Chronic diastolic (congestive) heart failure: Secondary | ICD-10-CM | POA: Diagnosis not present

## 2014-11-05 DIAGNOSIS — I2510 Atherosclerotic heart disease of native coronary artery without angina pectoris: Secondary | ICD-10-CM

## 2014-11-05 DIAGNOSIS — I10 Essential (primary) hypertension: Secondary | ICD-10-CM

## 2014-11-05 DIAGNOSIS — E119 Type 2 diabetes mellitus without complications: Secondary | ICD-10-CM | POA: Diagnosis not present

## 2014-11-05 DIAGNOSIS — I951 Orthostatic hypotension: Secondary | ICD-10-CM

## 2014-11-05 DIAGNOSIS — I480 Paroxysmal atrial fibrillation: Secondary | ICD-10-CM

## 2014-11-05 DIAGNOSIS — D649 Anemia, unspecified: Secondary | ICD-10-CM | POA: Diagnosis not present

## 2014-11-05 DIAGNOSIS — N132 Hydronephrosis with renal and ureteral calculous obstruction: Secondary | ICD-10-CM | POA: Diagnosis not present

## 2014-11-05 DIAGNOSIS — I5043 Acute on chronic combined systolic (congestive) and diastolic (congestive) heart failure: Secondary | ICD-10-CM | POA: Insufficient documentation

## 2014-11-05 DIAGNOSIS — K5190 Ulcerative colitis, unspecified, without complications: Secondary | ICD-10-CM | POA: Diagnosis not present

## 2014-11-05 DIAGNOSIS — I4819 Other persistent atrial fibrillation: Secondary | ICD-10-CM | POA: Insufficient documentation

## 2014-11-05 MED ORDER — PIOGLITAZONE HCL 30 MG PO TABS: 30.0000 mg | ORAL_TABLET | Freq: Every day | ORAL | Status: DC

## 2014-11-05 MED ORDER — FUROSEMIDE 20 MG PO TABS: 20.0000 mg | ORAL_TABLET | ORAL | Status: DC | PRN

## 2014-11-05 NOTE — Progress Notes (Signed)
Cardiology Office Note  Date:  11/05/2014   Patient ID:  [name] [name], [name] [name] [birth year], MRN [id number]  PCP:  [name], MD  Cardiologist:  Radford Pax   Chief Complaint: here for post-hospital follow-up with recent SOB  History of Present Illness: [name] [name] is a 81 y.o. male with history of CAD (06/2014: abnormal nuc, subsequent cath s/p DES to ramus, totally occ LAD not a candidate for CTO PCI, 50-70% OM2), atrial flutter s/p remote RFCA 2009, PAF, CKD stage III, frequent PVCs, dyslipidemia, DM, GERD, HTN, UC s/p remote colectomy who presents for post-hospital followup. Last seen by Dr. Radford Pax 10/14/14 for SOB with mildly elevated BNP thus Lasix was started. At time of cath 06/2014 he had normal right and left heart pressures.  He was admitted 1/6-1/11 with DOE and weakness and found to have acute anemia 8.8 (prev 11.9) with +FOBT. Required 2 U PRBC and was started on ferrous sulfate for iron deficiency. Also seen by urology due to small 59m stone seen on CT abd/pelvis and AKI with Cr 2.27 - he eventually passed this. Plavix was continued per cardiology recs due to recent stent (DAPT recommended for 1 year in 06/2014). GI plans eventual outpatient EGD and ileoscopy per ostomy once off Plavix for 5 days and plans to follow-up with him as an outpatient in February. He was also found to be back in AF (?first time since 2011) with soft BPs and was discharged on Lopressor 12.544mBID and amiodarone 20057maily. D/c Cr 1.34. TSH wnl and baseline AST/ALT OK. The cardiology team that saw him as an inpatient did not recommend anticoagulation due to his GIB. Per Dr. SkaKingsley Plante regarding Plavix, "the patient is only 3 months after DES placement, the risk of in stent restenosis is very high and we don't recommend to stop Plavix if at all possible, but rather wait another 3 months. If necessary hold aspirin and continue Plavix as monotherapy." 2D Echo 10/23/13: EF 50-55%, basal mid-inf HK, grade 2 DD, mild MR,  mod dilated LA, no sig change from prior. By phone note on 10/28/13 the patient was noting SOB and intermittent dizziness with orthostatic drop in BP from 149/69 to 82/59, pulse 75-87 however he was asymptomatic at that time. Dr. TurRadford Paxdered TED hose as well as a hold in Lasix for 2 days than to only use PRN for edema.  He comes in feeling well today. No complaints. No further SOB, chest pain, palpitations. He says the one episode of BP dropping was the only one he'd had. He does not get dizzy upon standing. Denies BRBPR, melena, hematemesis. He saw his PCP earlier this week stand states his Hgb was above 10. PCP also follows renal function. He has not had to use any further Lasix.  Past Medical History  Diagnosis Date  . Diabetes   . Obesity   . Ulcerative colitis   . HTN (hypertension)   . Dyslipidemia   . GERD (gastroesophageal reflux disease)   . History of peptic ulcer disease   . Ulcerative colitis     a. s/p total colectomy remotely.  . Anemia   . Macular degeneration   . Kidney stone may 2009    Right hydronephrosis, S/P stone removal  . Small bowel obstruction, partial 2009    Episode  . Shingles     episode  . Compression fracture of L1 lumbar vertebra   . Osteopenia   . Lower back pain   . OA (osteoarthritis) of hip   .  CKD (chronic kidney disease), stage III   . Hx of acute renal failure 01/02/10-3/24-11    due to hypovolemic shock, gastroenteritis and dehydration,hospitalized . Did requre a few days of dialysis. Cr at discharge was 1.8  . PAF (paroxysmal atrial fibrillation)   . Frequent PVCs   . CAD (coronary artery disease) 06/2014    a. 06/2014: abnl nuc. Cath: Totally occluded LAD with faint collaterals (not a candidate for CTO PCI), 90% ramus s/p DES, 50-70% OM2.  . Complication of anesthesia     " during my kidney stone surgery my heart went out of rhythm  . History of blood transfusion   . Atrial flutter     a. s/p RFCA of counterclockwise cavo-tricuspid  isthmus dependent atrial flutter 2009 by Dr. Rayann Heman.  . Chronic diastolic CHF (congestive heart failure)     a. 2D Echo 10/23/13: EF 50-55%, basal mid-inf HK, grade 2 DD, mild MR, mod dilated LA, no sig change from prior.    Past Surgical History  Procedure Laterality Date  . Back surgery    . Colon resection    . Hernia repair    . Tonsillectomy    . Cardiac catheterization  06/19/2014  . Coronary angioplasty    . Coronary stent placement  06/19/2014    DES       dr Martinique  . Left and right heart catheterization with coronary angiogram N/A 06/19/2014    Procedure: LEFT AND RIGHT HEART CATHETERIZATION WITH CORONARY ANGIOGRAM;  Surgeon: Peter M Martinique, MD;  Location: [facility] CATH LAB;  Service: Cardiovascular;  Laterality: N/A;    Current Outpatient Prescriptions  Medication Sig Dispense Refill  . amiodarone (PACERONE) 200 MG tablet Take 1 tablet (200 mg total) by mouth daily. 30 tablet 0  . aspirin 81 MG tablet Take 81 mg by mouth daily.    . calcium carbonate (OS-CAL) 600 MG TABS tablet Take 600 mg by mouth daily with breakfast.    . clopidogrel (PLAVIX) 75 MG tablet Take 1 tablet (75 mg total) by mouth daily. 30 tablet 6  . ferrous sulfate 325 (65 FE) MG tablet Take 1 tablet (325 mg total) by mouth 2 (two) times daily with a meal. 30 tablet 3  . furosemide (LASIX) 20 MG tablet Take 1 tablet (20 mg total) by mouth every other day as needed for fluid or edema. 30 tablet 3  . GLYBURIDE PO Take 2 mg by mouth daily.    . metoprolol tartrate (LOPRESSOR) 25 MG tablet Take 0.5 tablets (12.5 mg total) by mouth 2 (two) times daily. 60 tablet 0  . ranitidine (ZANTAC) 75 MG tablet Take 150 mg by mouth 2 (two) times daily. Patient unsure of exact dosage    . saxagliptin HCl (ONGLYZA) 5 MG TABS tablet Take 1 tablet (5 mg total) by mouth daily. 90 tablet 1  . simvastatin (ZOCOR) 20 MG tablet Take 20 mg by mouth daily.    . tamsulosin (FLOMAX) 0.4 MG CAPS capsule Take 1 capsule (0.4 mg total) by mouth daily  after supper. 30 capsule 3   No current facility-administered medications for this visit.    Allergies:   Brilinta; Penicillins; and Sulfa antibiotics   Social History:  The patient  reports that he has never smoked. He has never used smokeless tobacco. He reports that he does not drink alcohol or use illicit drugs.   Family History:  The patient's family history includes Colon cancer in his mother.  ROS:  Please see the history  of present illness. All other systems are reviewed and otherwise negative.   PHYSICAL EXAM:  VS:  BP 124/66 mmHg  Pulse 91  Ht 5' 8"  (1.727 m)  Wt 247 lb (112.038 kg)  BMI 37.56 kg/m2 BMI: Body mass index is 37.56 kg/(m^2). Well nourished, obese WM, in no acute distress, kyphotic posture HEENT: normocephalic, atraumatic Neck: no JVD Cardiac:  normal S1, S2; RRR; no murmur Lungs:  clear to auscultation bilaterally, no wheezing, rhonchi or rales Abd: soft, nontender, no hepatomegaly Ext: no edema Skin: warm and dry Neuro:  moves all extremities spontaneously, no focal abnormalities noted  EKG:  NSR 91bpm, incomplete RBBB, LAFB, nonspeciifc T wave changes. No significant change from prior except now in sinus. (This was ordered due to recent AF)  Recent Labs: 10/14/2014: Pro B Natriuretic peptide (BNP) 459.0* 10/22/2014: ALT 9; B Natriuretic Peptide 384.1* 10/24/2014: TSH 1.419 10/27/2014: BUN 20; Creatinine 1.34; Hemoglobin 8.9*; Platelets 269; Potassium 4.8; Sodium 134*  No results found for requested labs within last 365 days.   Estimated Creatinine Clearance: 54.2 mL/min (by C-G formula based on Cr of 1.34).   Wt Readings from Last 3 Encounters:  11/05/14 247 lb (112.038 kg)  10/27/14 246 lb 7.6 oz (111.8 kg)  10/14/14 254 lb 12.8 oz (115.577 kg)     Other studies reviewed: Additional studies/records reviewed today include: all hospital records as summarized above.  ASSESSMENT AND PLAN:  1. PAF - maintaining NSR on amiodarone. Will continue low  dose. Further monitoring of organ systems while on amiodarone will be at discretion of primary cardiologist. As above, not currently a candidate for anticoagulation due to recent acute anemia and possible GI bleeding given +FOBT. He also requires ongoing DAPT for his recent PCI, thus would not be a candidate for triple anticoag therapy. 2. Recent symptomatic anemia with + FOBT - f/u GI as planned. As above, given recent PCI in 06/2014, we do not recommend to stop Plavix if at all possible, but rather wait another 3 months. I will have the patient f/u with Dr. Radford Pax in early March to discuss this as a possibility.  3. CAD s/p PCI with DES 06/2014 - continue ASA, Plavix, BB, statin. See above. 4. Recent AKI on CKD stage III - followed by PCP. 5. Essential HTN with recent orthostatic HYPOtension - renal function recently followed by PCP. No further episodes of orthostasis per patient. 6. Chronic diastolic CHF (previously required Lasix in 09/2014) - appears euvolemic. Now only taking Lasix PRN and hasn't had to use it recently. He says his PCP put him back on Actos for DM and I asked him to discuss further with PCP since this can cause fluid retention. The nurse will be calling to clarify this dose.  Disposition: F/u with Dr. Radford Pax early March 2016.  Current medicines are reviewed at length with the patient today.  The patient did not have any concerns regarding medicines.  Raechel Ache PA-C 11/05/2014 9:21 AM     [address] [id number] [phone] (office)  [phone] (fax)

## 2014-11-05 NOTE — Addendum Note (Signed)
Addended by: Charlie Pitter on: 11/05/2014 05:40 PM   Modules accepted: Orders, Medications

## 2014-11-05 NOTE — Patient Instructions (Addendum)
Your physician recommends that you continue on your current medications as directed. Please refer to the Current Medication list given to you today.   FOLLOW UP WITH DR TURNER IN EARLY MARCH    PLEASE  DISCUSS ACTOS WITH YOUR PCP   THIS CAN CAUSE FLUID RETENTION  ISSUES WITH YOUR HISTORY OF VOLUME OVERLOAD

## 2014-11-05 NOTE — Addendum Note (Signed)
Addended by: Claude Manges on: 11/05/2014 03:24 PM   Modules accepted: Orders

## 2014-11-06 DIAGNOSIS — D649 Anemia, unspecified: Secondary | ICD-10-CM | POA: Diagnosis not present

## 2014-11-06 DIAGNOSIS — E119 Type 2 diabetes mellitus without complications: Secondary | ICD-10-CM | POA: Diagnosis not present

## 2014-11-06 DIAGNOSIS — K922 Gastrointestinal hemorrhage, unspecified: Secondary | ICD-10-CM | POA: Diagnosis not present

## 2014-11-06 DIAGNOSIS — K5190 Ulcerative colitis, unspecified, without complications: Secondary | ICD-10-CM | POA: Diagnosis not present

## 2014-11-06 DIAGNOSIS — I2510 Atherosclerotic heart disease of native coronary artery without angina pectoris: Secondary | ICD-10-CM | POA: Diagnosis not present

## 2014-11-06 DIAGNOSIS — N132 Hydronephrosis with renal and ureteral calculous obstruction: Secondary | ICD-10-CM | POA: Diagnosis not present

## 2014-11-11 DIAGNOSIS — N183 Chronic kidney disease, stage 3 (moderate): Secondary | ICD-10-CM | POA: Diagnosis not present

## 2014-11-11 DIAGNOSIS — E1122 Type 2 diabetes mellitus with diabetic chronic kidney disease: Secondary | ICD-10-CM | POA: Diagnosis not present

## 2014-11-11 DIAGNOSIS — K279 Peptic ulcer, site unspecified, unspecified as acute or chronic, without hemorrhage or perforation: Secondary | ICD-10-CM | POA: Diagnosis not present

## 2014-11-11 DIAGNOSIS — Z0001 Encounter for general adult medical examination with abnormal findings: Secondary | ICD-10-CM | POA: Diagnosis not present

## 2014-11-11 DIAGNOSIS — Z1389 Encounter for screening for other disorder: Secondary | ICD-10-CM | POA: Diagnosis not present

## 2014-11-11 DIAGNOSIS — Z125 Encounter for screening for malignant neoplasm of prostate: Secondary | ICD-10-CM | POA: Diagnosis not present

## 2014-11-11 DIAGNOSIS — I10 Essential (primary) hypertension: Secondary | ICD-10-CM | POA: Diagnosis not present

## 2014-11-11 DIAGNOSIS — I4891 Unspecified atrial fibrillation: Secondary | ICD-10-CM | POA: Diagnosis not present

## 2014-11-11 DIAGNOSIS — E782 Mixed hyperlipidemia: Secondary | ICD-10-CM | POA: Diagnosis not present

## 2014-11-11 DIAGNOSIS — K5190 Ulcerative colitis, unspecified, without complications: Secondary | ICD-10-CM | POA: Diagnosis not present

## 2014-11-11 DIAGNOSIS — Z23 Encounter for immunization: Secondary | ICD-10-CM | POA: Diagnosis not present

## 2014-11-11 DIAGNOSIS — D649 Anemia, unspecified: Secondary | ICD-10-CM | POA: Diagnosis not present

## 2014-11-12 ENCOUNTER — Ambulatory Visit: Payer: Medicare Other | Admitting: Physician Assistant

## 2014-11-12 DIAGNOSIS — K5190 Ulcerative colitis, unspecified, without complications: Secondary | ICD-10-CM | POA: Diagnosis not present

## 2014-11-12 DIAGNOSIS — E119 Type 2 diabetes mellitus without complications: Secondary | ICD-10-CM | POA: Diagnosis not present

## 2014-11-12 DIAGNOSIS — I2510 Atherosclerotic heart disease of native coronary artery without angina pectoris: Secondary | ICD-10-CM | POA: Diagnosis not present

## 2014-11-12 DIAGNOSIS — K922 Gastrointestinal hemorrhage, unspecified: Secondary | ICD-10-CM | POA: Diagnosis not present

## 2014-11-12 DIAGNOSIS — N132 Hydronephrosis with renal and ureteral calculous obstruction: Secondary | ICD-10-CM | POA: Diagnosis not present

## 2014-11-12 DIAGNOSIS — D649 Anemia, unspecified: Secondary | ICD-10-CM | POA: Diagnosis not present

## 2014-11-25 ENCOUNTER — Telehealth: Payer: Self-pay | Admitting: Cardiology

## 2014-11-25 NOTE — Telephone Encounter (Signed)
New message     Please refill amiodarone 266m at [facility] [address]-----pt was presc medication while in hosp.  Pt has 1 pill left.

## 2014-11-26 ENCOUNTER — Telehealth: Payer: Self-pay

## 2014-11-26 NOTE — Telephone Encounter (Signed)
LMTCO.

## 2014-11-27 ENCOUNTER — Encounter: Payer: Self-pay | Admitting: Internal Medicine

## 2014-11-27 ENCOUNTER — Other Ambulatory Visit: Payer: Self-pay | Admitting: *Deleted

## 2014-11-27 ENCOUNTER — Other Ambulatory Visit (INDEPENDENT_AMBULATORY_CARE_PROVIDER_SITE_OTHER): Payer: Medicare Other

## 2014-11-27 ENCOUNTER — Other Ambulatory Visit: Payer: Self-pay

## 2014-11-27 ENCOUNTER — Ambulatory Visit (INDEPENDENT_AMBULATORY_CARE_PROVIDER_SITE_OTHER): Payer: Medicare Other | Admitting: Internal Medicine

## 2014-11-27 VITALS — BP 120/64 | HR 60 | Ht 67.5 in | Wt 253.4 lb

## 2014-11-27 DIAGNOSIS — D649 Anemia, unspecified: Secondary | ICD-10-CM

## 2014-11-27 DIAGNOSIS — N200 Calculus of kidney: Secondary | ICD-10-CM | POA: Diagnosis not present

## 2014-11-27 DIAGNOSIS — I25110 Atherosclerotic heart disease of native coronary artery with unstable angina pectoris: Secondary | ICD-10-CM | POA: Diagnosis not present

## 2014-11-27 DIAGNOSIS — R195 Other fecal abnormalities: Secondary | ICD-10-CM

## 2014-11-27 DIAGNOSIS — I4891 Unspecified atrial fibrillation: Secondary | ICD-10-CM | POA: Diagnosis not present

## 2014-11-27 DIAGNOSIS — I2510 Atherosclerotic heart disease of native coronary artery without angina pectoris: Secondary | ICD-10-CM

## 2014-11-27 DIAGNOSIS — N183 Chronic kidney disease, stage 3 (moderate): Secondary | ICD-10-CM | POA: Diagnosis not present

## 2014-11-27 DIAGNOSIS — E0829 Diabetes mellitus due to underlying condition with other diabetic kidney complication: Secondary | ICD-10-CM | POA: Diagnosis not present

## 2014-11-27 DIAGNOSIS — K922 Gastrointestinal hemorrhage, unspecified: Secondary | ICD-10-CM | POA: Diagnosis not present

## 2014-11-27 DIAGNOSIS — E1122 Type 2 diabetes mellitus with diabetic chronic kidney disease: Secondary | ICD-10-CM | POA: Diagnosis not present

## 2014-11-27 LAB — CBC WITH DIFFERENTIAL/PLATELET
Basophils Absolute: 0 10*3/uL (ref 0.0–0.1)
Basophils Relative: 0.2 % (ref 0.0–3.0)
Eosinophils Absolute: 0.3 10*3/uL (ref 0.0–0.7)
Eosinophils Relative: 3.2 % (ref 0.0–5.0)
HCT: 33.9 % — ABNORMAL LOW (ref 39.0–52.0)
Hemoglobin: 11.2 g/dL — ABNORMAL LOW (ref 13.0–17.0)
Lymphocytes Relative: 11.7 % — ABNORMAL LOW (ref 12.0–46.0)
Lymphs Abs: 0.9 10*3/uL (ref 0.7–4.0)
MCHC: 33.1 g/dL (ref 30.0–36.0)
MCV: 81 fl (ref 78.0–100.0)
Monocytes Absolute: 0.6 10*3/uL (ref 0.1–1.0)
Monocytes Relative: 7.8 % (ref 3.0–12.0)
Neutro Abs: 6 10*3/uL (ref 1.4–7.7)
Neutrophils Relative %: 77.1 % — ABNORMAL HIGH (ref 43.0–77.0)
Platelets: 281 10*3/uL (ref 150.0–400.0)
RBC: 4.19 Mil/uL — ABNORMAL LOW (ref 4.22–5.81)
RDW: 23.1 % — ABNORMAL HIGH (ref 11.5–15.5)
WBC: 7.7 10*3/uL (ref 4.0–10.5)

## 2014-11-27 MED ORDER — AMIODARONE HCL 200 MG PO TABS: 200.0000 mg | ORAL_TABLET | Freq: Every day | ORAL | Status: DC

## 2014-11-27 NOTE — Patient Instructions (Signed)
  Your physician has requested that you go to the basement for the following lab work before leaving today:  [place] have been scheduled for an endoscopy. Please follow written instructions given to you at your visit today. If you use inhalers (even only as needed), please bring them with you on the day of your procedure. Your physician has requested that you go to www.startemmi.com and enter the access code given to you at your visit today. This web site gives a general overview about your procedure. However, you should still follow specific instructions given to you by our office regarding your preparation for the procedure.

## 2014-11-27 NOTE — Progress Notes (Signed)
HISTORY OF PRESENT ILLNESS:  [name] [name] is a 81 y.o. male with multiple medical problems including obesity, diabetes mellitus, hypertension, dyslipidemia, history of peptic ulcer disease with bleeding, history of iron deficiency anemia, history of coronary artery disease with coronary artery intervention about 3 months ago on aspirin and Plavix, chronic renal insufficiency, and a remote history of ulcerative colitis for which she is status post total colectomy with ileostomy in 1985. The patient is sent this office today after being evaluated in the hospital for symptomatic iron deficiency anemia and Hemoccult-positive stool. He received blood transfusion and felt better. No GI intervention was performed. He was sent home on oral iron therapy. The patient denies melena or hematochezia. He tolerates iron. He states that his shortness of breath has resolved and that he feels "100% better". His cardiologist feels that he needs to stay on aspirin and Plavix for at least another 3 months. Of interest, the patient was in the hospital August 2009 for iron deficiency anemia and Hemoccult-positive stool. Upper endoscopy was performed with Dr. Carlean Purl and reported as normal. Apparently the patient had subsequently been on iron, but discontinued thereafter at some point. His hemoglobin last year was 11.9. His admission hemoglobin was 7.1 in January. Discharge hemoglobin 8.9 posttransfusion  REVIEW OF SYSTEMS:  All non-GI ROS negative except for fatigue (improved)  Past Medical History  Diagnosis Date  . Diabetes   . Obesity   . Ulcerative colitis   . HTN (hypertension)   . Dyslipidemia   . GERD (gastroesophageal reflux disease)   . History of peptic ulcer disease   . Ulcerative colitis     a. s/p total colectomy remotely.  . Anemia   . Macular degeneration   . Kidney stone may 2009    Right hydronephrosis, S/P stone removal  . Small bowel obstruction, partial 2009    Episode  . Shingles     episode   . Compression fracture of L1 lumbar vertebra   . Osteopenia   . Lower back pain   . OA (osteoarthritis) of hip   . CKD (chronic kidney disease), stage III   . Hx of acute renal failure 01/02/10-3/24-11    due to hypovolemic shock, gastroenteritis and dehydration,hospitalized . Did requre a few days of dialysis. Cr at discharge was 1.8  . PAF (paroxysmal atrial fibrillation)   . Frequent PVCs   . CAD (coronary artery disease) 06/2014    a. 06/2014: abnl nuc. Cath: Totally occluded LAD with faint collaterals (not a candidate for CTO PCI), 90% ramus s/p DES, 50-70% OM2.  . Complication of anesthesia     " during my kidney stone surgery my heart went out of rhythm  . History of blood transfusion   . Atrial flutter     a. s/p RFCA of counterclockwise cavo-tricuspid isthmus dependent atrial flutter 2009 by Dr. Rayann Heman.  . Chronic diastolic CHF (congestive heart failure)     a. 2D Echo 10/23/13: EF 50-55%, basal mid-inf HK, grade 2 DD, mild MR, mod dilated LA, no sig change from prior.    Past Surgical History  Procedure Laterality Date  . Back surgery    . Colon resection    . Hernia repair    . Tonsillectomy    . Cardiac catheterization  06/19/2014  . Coronary angioplasty    . Coronary stent placement  06/19/2014    DES       dr Martinique  . Left and right heart catheterization with coronary angiogram N/A 06/19/2014  Procedure: LEFT AND RIGHT HEART CATHETERIZATION WITH CORONARY ANGIOGRAM;  Surgeon: Peter M Martinique, MD;  Location: [facility] CATH LAB;  Service: Cardiovascular;  Laterality: N/A;    Social History [name]  reports that he has never smoked. He has never used smokeless tobacco. He reports that he does not drink alcohol or use illicit drugs.  family history includes Colon cancer in his mother.  Allergies  Allergen Reactions  . Brilinta [Ticagrelor]     Changed to Plavix due to SOB  . Penicillins Rash  . Sulfa Antibiotics Rash       PHYSICAL EXAMINATION: Vital signs: BP  120/64 mmHg  Pulse 60  Ht 5' 7.5" (1.715 m)  Wt 253 lb 6.4 oz (114.941 kg)  BMI 39.08 kg/m2  Constitutional: Chronically ill-appearing, no acute distress. Obese Psychiatric: alert and oriented x3, cooperative Eyes: extraocular movements intact, anicteric, conjunctiva pink Mouth: oral pharynx moist, no lesions Neck: supple no lymphadenopathy Cardiovascular: heart regular rate and rhythm, no murmur Lungs: clear to auscultation bilaterally Abdomen: soft, obese, nontender, nondistended, no obvious ascites, no peritoneal signs, normal bowel sounds, no organomegaly. Unremarkable ileostomy with bag in right midabdomen Rectal: Omitted Extremities: Trace lower extremity edema bilaterally Skin: no clinically relevant lesions on visible extremities Neuro: No focal deficits.   ASSESSMENT:  #1. Symptomatic anemia requiring hospitalization. Feeling better post transfusion and on iron therapy. Last hemoglobin at discharge 8.9 #2. Hemoccult-positive stool. Suspect either GI mucosal lesion from aspirin or nonspecific mucosal oozing on Plavix or small bowel AVMs. #3. MULTIPLE SIGNIFICANT medical problems   PLAN:  #1. Continue iron therapy #2. Follow-up CBC today #3. Schedule diagnostic upper endoscopy on aspirin and Plavix. High risk patient given age and multiple significant comorbidities.The nature of the procedure, as well as the risks, benefits, and alternatives were carefully and thoroughly reviewed with the patient. Ample time for discussion and questions allowed. The patient understood, was satisfied, and agreed to proceed. #4. May need PPI depending on findings #5. Hold diabetic medications on the day of the procedure to avoid hypoglycemia  Sent copies to Dr. Deforest Hoyles and Dr. Radford Pax

## 2014-12-01 ENCOUNTER — Encounter: Payer: Self-pay | Admitting: Internal Medicine

## 2014-12-01 ENCOUNTER — Ambulatory Visit (AMBULATORY_SURGERY_CENTER): Payer: Medicare Other | Admitting: Internal Medicine

## 2014-12-01 VITALS — BP 104/61 | HR 68 | Temp 96.6°F | Resp 27 | Ht 67.5 in | Wt 253.0 lb

## 2014-12-01 DIAGNOSIS — K219 Gastro-esophageal reflux disease without esophagitis: Secondary | ICD-10-CM | POA: Diagnosis not present

## 2014-12-01 DIAGNOSIS — D649 Anemia, unspecified: Secondary | ICD-10-CM | POA: Diagnosis not present

## 2014-12-01 DIAGNOSIS — E119 Type 2 diabetes mellitus without complications: Secondary | ICD-10-CM | POA: Diagnosis not present

## 2014-12-01 DIAGNOSIS — R195 Other fecal abnormalities: Secondary | ICD-10-CM

## 2014-12-01 DIAGNOSIS — I4891 Unspecified atrial fibrillation: Secondary | ICD-10-CM | POA: Diagnosis not present

## 2014-12-01 DIAGNOSIS — K253 Acute gastric ulcer without hemorrhage or perforation: Secondary | ICD-10-CM | POA: Diagnosis not present

## 2014-12-01 LAB — GLUCOSE, CAPILLARY
Glucose-Capillary: 124 mg/dL — ABNORMAL HIGH (ref 70–99)
Glucose-Capillary: 114 mg/dL — ABNORMAL HIGH (ref 70–99)

## 2014-12-01 MED ORDER — SODIUM CHLORIDE 0.9 % IV SOLN: 500.0000 mL | INTRAVENOUS | Status: DC

## 2014-12-01 MED ORDER — OMEPRAZOLE 20 MG PO CPDR: 20.0000 mg | DELAYED_RELEASE_CAPSULE | Freq: Every day | ORAL | Status: DC

## 2014-12-01 NOTE — Patient Instructions (Signed)
Discharge instructions given. Resume previous medications. Biopsies taken. YOU HAD AN ENDOSCOPIC PROCEDURE TODAY AT [facility]: Refer to the procedure report that was given to you for any specific questions about what was found during the examination.  If the procedure report does not answer your questions, please call your gastroenterologist to clarify.  If you requested that your care partner not be given the details of your procedure findings, then the procedure report has been included in a sealed envelope for you to review at your convenience later.  YOU SHOULD EXPECT: Some feelings of bloating in the abdomen. Passage of more gas than usual.  Walking can help get rid of the air that was put into your GI tract during the procedure and reduce the bloating. If you had a lower endoscopy (such as a colonoscopy or flexible sigmoidoscopy) you may notice spotting of blood in your stool or on the toilet paper. If you underwent a bowel prep for your procedure, then you may not have a normal bowel movement for a few days.  DIET: Your first meal following the procedure should be a light meal and then it is ok to progress to your normal diet.  A half-sandwich or bowl of soup is an example of a good first meal.  Heavy or fried foods are harder to digest and may make you feel nauseous or bloated.  Likewise meals heavy in dairy and vegetables can cause extra gas to form and this can also increase the bloating.  Drink plenty of fluids but you should avoid alcoholic beverages for 24 hours.  ACTIVITY: Your care partner should take you home directly after the procedure.  You should plan to take it easy, moving slowly for the rest of the day.  You can resume normal activity the day after the procedure however you should NOT DRIVE or use heavy machinery for 24 hours (because of the sedation medicines used during the test).    SYMPTOMS TO REPORT IMMEDIATELY: A gastroenterologist can be reached at any  hour.  During normal business hours, 8:30 AM to 5:00 PM Monday through Friday, call [phone].  After hours and on weekends, please call the GI answering service at [phone] who will take a message and have the physician on call contact you.   Following upper endoscopy (EGD)  Vomiting of blood or coffee ground material  New chest pain or pain under the shoulder blades  Painful or persistently difficult swallowing  New shortness of breath  Fever of 100F or higher  Black, tarry-looking stools  FOLLOW UP: If any biopsies were taken you will be contacted by phone or by letter within the next 1-3 weeks.  Call your gastroenterologist if you have not heard about the biopsies in 3 weeks.  Our staff will call the home number listed on your records the next business day following your procedure to check on you and address any questions or concerns that you may have at that time regarding the information given to you following your procedure. This is a courtesy call and so if there is no answer at the home number and we have not heard from you through the emergency physician on call, we will assume that you have returned to your regular daily activities without incident.  SIGNATURES/CONFIDENTIALITY: You and/or your care partner have signed paperwork which will be entered into your electronic medical record.  These signatures attest to the fact that that the information above on your After Visit Summary has  been reviewed and is understood.  Full responsibility of the confidentiality of this discharge information lies with you and/or your care-partner.

## 2014-12-01 NOTE — Progress Notes (Signed)
Called to room to assist during endoscopic procedure.  Patient ID and intended procedure confirmed with present staff. Received instructions for my participation in the procedure from the performing physician.  

## 2014-12-01 NOTE — Op Note (Signed)
[facility]  [address] [place], [id number]   ENDOSCOPY PROCEDURE REPORT  PATIENT: [name] [name], [name] [name]  MR#: [id number] BIRTHDATE: 31-Jan-1933 , 70  yrs. old GENDER: male ENDOSCOPIST: Eustace Quail, MD REFERRED BY:  .  Self / Office PROCEDURE DATE:  12/01/2014 PROCEDURE:  EGD w/ biopsy ASA CLASS:     Class III INDICATIONS:  hemocult positive stool and iron deficiency anemia. NOTE hemoglobin 11/27/2014 improved to 11.2 MEDICATIONS: Monitored anesthesia care and Propofol 100 mg IV TOPICAL ANESTHETIC: none  DESCRIPTION OF PROCEDURE: After the risks benefits and alternatives of the procedure were thoroughly explained, informed consent was obtained.  The [facility] [id number] endoscope was introduced through the mouth and advanced to the second portion of the duodenum , Without limitations.  The instrument was slowly withdrawn as the mucosa was fully examined.   EXAM:The esophagus was normal.  The stomach revealed multiple superficial erosions.  The duodenal bulb and post bulbar duodenum were normal.  CLO biopsy taken.  Retroflexed views revealed no abnormalities.     The scope was then withdrawn from the patient and the procedure completed.  COMPLICATIONS: There were no immediate complications.  ENDOSCOPIC IMPRESSION: 1. Multiple gastric erosions. Likely explains anemia and Hemoccult-positive stool in the face of aspirin and Plavix therapy 2. Otherwise normal EGD  RECOMMENDATIONS: 1.  Continue iron therapy daily indefinitely 2.  Prescribe omeprazole 20 mg daily; #30; 1 by mouth daily. He should stay on this indefinitely to protect his upper GI mucosa against further erosive change and ulcer formation 3. Follow-up CLO and treat for H. pylori if positive 4. Return to the care of Dr. Lysle Rubens. He will monitor your blood counts periodically. GI follow-up as needed  REPEAT EXAM:  eSigned:  Eustace Quail, MD 12/01/2014 11:48 AM    DH:DIXBOE, Denton Ar MD,  [name] MD, and The Patient

## 2014-12-02 ENCOUNTER — Telehealth: Payer: Self-pay | Admitting: *Deleted

## 2014-12-02 LAB — HELICOBACTER PYLORI SCREEN-BIOPSY: UREASE: NEGATIVE

## 2014-12-02 NOTE — Telephone Encounter (Signed)
  Follow up Call-  Call back number 12/01/2014  Post procedure Call Back phone  # [phone]  Permission to leave phone message Yes     Patient questions:  Do you have a fever, pain , or abdominal swelling? No. Pain Score  0 *  Have you tolerated food without any problems? Yes.    Have you been able to return to your normal activities? Yes.    Do you have any questions about your discharge instructions: Diet   No. Medications  No. Follow up visit  No.  Do you have questions or concerns about your Care? No.  Actions: * If pain score is 4 or above: No action needed, pain <4.

## 2014-12-04 DIAGNOSIS — I10 Essential (primary) hypertension: Secondary | ICD-10-CM | POA: Diagnosis not present

## 2014-12-04 DIAGNOSIS — D509 Iron deficiency anemia, unspecified: Secondary | ICD-10-CM | POA: Diagnosis not present

## 2014-12-04 DIAGNOSIS — K259 Gastric ulcer, unspecified as acute or chronic, without hemorrhage or perforation: Secondary | ICD-10-CM | POA: Diagnosis not present

## 2014-12-25 ENCOUNTER — Encounter: Payer: Self-pay | Admitting: Cardiology

## 2014-12-25 ENCOUNTER — Ambulatory Visit (INDEPENDENT_AMBULATORY_CARE_PROVIDER_SITE_OTHER): Payer: Medicare Other | Admitting: Cardiology

## 2014-12-25 VITALS — BP 120/66 | HR 61 | Ht 66.0 in | Wt 247.1 lb

## 2014-12-25 DIAGNOSIS — I2510 Atherosclerotic heart disease of native coronary artery without angina pectoris: Secondary | ICD-10-CM | POA: Diagnosis not present

## 2014-12-25 DIAGNOSIS — I10 Essential (primary) hypertension: Secondary | ICD-10-CM

## 2014-12-25 DIAGNOSIS — E785 Hyperlipidemia, unspecified: Secondary | ICD-10-CM

## 2014-12-25 DIAGNOSIS — I480 Paroxysmal atrial fibrillation: Secondary | ICD-10-CM

## 2014-12-25 NOTE — Progress Notes (Signed)
Cardiology Office Note   Date:  12/25/2014   ID:  [name] [name], DOB April 23, 1933, MRN [id number]  PCP:  [name], MD  Cardiologist:   Sueanne Margarita, MD   Chief Complaint  Patient presents with  . Coronary Artery Disease  . Hypertension  . Hyperlipidemia      History of Present Illness: [name] [name] is a 81 y.o. male with a history of PAF,and chronic SOB. Chest CT angio, done for w/u of pulmonary HTN, showed enlarged pulmonary arteries and atherosclerosis of the coronary arteries. Nuclear stress test showed an intermediate risk stress nuclear study with medium size mild severity ischemia in the LAD territory and a small scar with periinfarct ischemia in the PDA territory (total SDS 6). He underwent cardiac cath showing 2 vessel ASCAD with chronic total occlusion of the LAD with minimal collaterals and severe disease of the large ramus and normal LVF. He underwent DES to the ramus. He is on DAPT. Right heart cath demonstrated no evidence of pulmonary HTN. He denies any chest pain, LE edema, dizziness, palpitations or syncope. He has chronic SOB which significantly limits is activities. Brilinta was changed to ASA due to SOB. He was recently in [facility] with abdominal pain and SOB.  He had been placed on Lasix prior to that for elevated BNP and SOB but breathing did not improve on Lasix.  His Hbg was found to be 8.8 and stool positive for blood.  He was started on Iron and EGD was done showing esophageal erosions. He did have a bout of PAF with RVR and was discharged on Amio and lopressor.   He breathing has significantly improved after changing him from Brilinta to Plavix and getting his anemia resolved.  His main complaint is getting fatigued easily and dizziness if he stands for too long.  He denies any chest pain or palpitations or LE edema.       Past Medical History  Diagnosis Date  . Diabetes   . Obesity   . Ulcerative colitis   . HTN (hypertension)   . Dyslipidemia   .  GERD (gastroesophageal reflux disease)   . History of peptic ulcer disease   . Ulcerative colitis     a. s/p total colectomy remotely.  . Anemia   . Macular degeneration   . Kidney stone may 2009    Right hydronephrosis, S/P stone removal  . Small bowel obstruction, partial 2009    Episode  . Shingles     episode  . Compression fracture of L1 lumbar vertebra   . Osteopenia   . Lower back pain   . OA (osteoarthritis) of hip   . CKD (chronic kidney disease), stage III   . Hx of acute renal failure 01/02/10-3/24-11    due to hypovolemic shock, gastroenteritis and dehydration,hospitalized . Did requre a few days of dialysis. Cr at discharge was 1.8  . PAF (paroxysmal atrial fibrillation)     not on long term anticoagulation due to history of heme positive stools and anemia  . Frequent PVCs   . CAD (coronary artery disease) 06/2014    a. 06/2014: abnl nuc. Cath: Totally occluded LAD with faint collaterals (not a candidate for CTO PCI), 90% ramus s/p DES, 50-70% OM2.  . Complication of anesthesia     " during my kidney stone surgery my heart went out of rhythm  . History of blood transfusion   . Atrial flutter     a. s/p RFCA of counterclockwise cavo-tricuspid isthmus  dependent atrial flutter 2009 by Dr. Rayann Heman.  . Chronic diastolic CHF (congestive heart failure)     a. 2D Echo 10/23/13: EF 50-55%, basal mid-inf HK, grade 2 DD, mild MR, mod dilated LA, no sig change from prior.    Past Surgical History  Procedure Laterality Date  . Back surgery    . Colon resection    . Hernia repair    . Tonsillectomy    . Cardiac catheterization  06/19/2014  . Coronary angioplasty    . Coronary stent placement  06/19/2014    DES       dr Martinique  . Left and right heart catheterization with coronary angiogram N/A 06/19/2014    Procedure: LEFT AND RIGHT HEART CATHETERIZATION WITH CORONARY ANGIOGRAM;  Surgeon: Peter M Martinique, MD;  Location: [facility] CATH LAB;  Service: Cardiovascular;  Laterality: N/A;      Current Outpatient Prescriptions  Medication Sig Dispense Refill  . amiodarone (PACERONE) 200 MG tablet Take 1 tablet (200 mg total) by mouth daily. 30 tablet 10  . aspirin 81 MG tablet Take 81 mg by mouth daily.    . calcium carbonate (OS-CAL) 600 MG TABS tablet Take 600 mg by mouth daily with breakfast.    . clopidogrel (PLAVIX) 75 MG tablet Take 1 tablet (75 mg total) by mouth daily. 30 tablet 6  . ferrous sulfate 325 (65 FE) MG tablet Take 1 tablet (325 mg total) by mouth 2 (two) times daily with a meal. 30 tablet 3  . GLYBURIDE PO Take 2 mg by mouth daily.    . metoprolol tartrate (LOPRESSOR) 25 MG tablet Take 0.5 tablets (12.5 mg total) by mouth 2 (two) times daily. 60 tablet 0  . omeprazole (PRILOSEC) 20 MG capsule Take 1 capsule (20 mg total) by mouth daily. 30 capsule 11  . pioglitazone (ACTOS) 30 MG tablet Take 1 tablet (30 mg total) by mouth daily.    . ranitidine (ZANTAC) 75 MG tablet Take 150 mg by mouth every morning. Patient unsure of exact dosage    . saxagliptin HCl (ONGLYZA) 5 MG TABS tablet Take 1 tablet (5 mg total) by mouth daily. 90 tablet 1  . simvastatin (ZOCOR) 20 MG tablet Take 20 mg by mouth daily.    . tamsulosin (FLOMAX) 0.4 MG CAPS capsule Take 1 capsule (0.4 mg total) by mouth daily after supper. 30 capsule 3   No current facility-administered medications for this visit.    Allergies:   Brilinta; Penicillins; and Sulfa antibiotics    Social History:  The patient  reports that he has never smoked. He has never used smokeless tobacco. He reports that he does not drink alcohol or use illicit drugs.   Family History:  The patient's family history includes Colon cancer in his mother.    ROS:  Please see the history of present illness.   Otherwise, review of systems are positive for none.   All other systems are reviewed and negative.    PHYSICAL EXAM: VS:  BP 120/66 mmHg  Pulse 61  Ht 5' 6"  (1.676 m)  Wt 247 lb 1.9 oz (112.093 kg)  BMI 39.91 kg/m2  , BMI Body mass index is 39.91 kg/(m^2). GEN: Well nourished, well developed, in no acute distress HEENT: normal Neck: no JVD, carotid bruits, or masses Cardiac: RRR; no murmurs, rubs, or gallops,no edema  Respiratory:  clear to auscultation bilaterally, normal work of breathing GI: soft, nontender, nondistended, + BS MS: no deformity or atrophy Skin: warm and dry, no  rash Neuro:  Strength and sensation are intact Psych: euthymic mood, full affect   EKG:  EKG was ordered today and showed NSR with LAFB, IRBBB and QTc 410mec    Recent Labs: 10/14/2014: Pro B Natriuretic peptide (BNP) 459.0* 10/22/2014: ALT 9; B Natriuretic Peptide 384.1* 10/24/2014: TSH 1.419 10/27/2014: BUN 20; Creatinine 1.34; Potassium 4.8; Sodium 134* 11/27/2014: Hemoglobin 11.2*; Platelets 281.0    Lipid Panel    Component Value Date/Time   CHOL  05/22/2008 0910    135        ATP III CLASSIFICATION:  <200     mg/dL   Desirable  200-239  mg/dL   Borderline High  >=240    mg/dL   High   TRIG 127 05/22/2008 0910   HDL 38* 05/22/2008 0910   CHOLHDL 3.6 05/22/2008 0910   VLDL 25 05/22/2008 0910   LDLCALC  05/22/2008 0910    72        Total Cholesterol/HDL:CHD Risk Coronary Heart Disease Risk Table                     Men   Women  1/2 Average Risk   3.4   3.3      Wt Readings from Last 3 Encounters:  12/25/14 247 lb 1.9 oz (112.093 kg)  12/01/14 253 lb (114.76 kg)  11/27/14 253 lb 6.4 oz (114.941 kg)        ASSESSMENT AND PLAN:   CAD in native artery- total LAD, s/p RI DES 06/19/14 Recent work up [place] abnormal Myov[place]. Subsequent elective cath showed total LAD and RI disease. He underwent RI DES. Continue DAPT with ASA and Plavix.   Pulmonary HTN - no evidence of pulmonary HTN on recent right heart cath  Diabetes Type 2 NIDDM  Benign essential HTN Controlled. Continue amlodipine/metoprolol  Frequent PVCs  Dyslipidemia - continue Zocor. Check FLP and ALT  PAF -  maintaining NSR on Amio/BB.  He is not a long term anticoagluation candidate due to needing DAPT as well as history of anemia and heme positive stools    Current medicines are reviewed at length with the patient today.  The patient does not have concerns regarding medicines.  The following changes have been made:  no change  Labs/ tests ordered today include: FLP and ALT     Disposition:   FU with me in 6 months   Signed, TSueanne Margarita MD  12/25/2014 12:32 PM    [facility] [address] [place]  [id number]: [phone] Fax: [phone]

## 2014-12-25 NOTE — Patient Instructions (Signed)
Your physician recommends that you return for FASTING lab work in: 6 weeks.  You may come ANY TIME between 7:30 AM and 5:00 PM.  Your physician wants you to follow-up in: 6 months with Dr. Radford Pax. You will receive a reminder letter in the mail two months in advance. If you don't receive a letter, please call our office to schedule the follow-up appointment.

## 2014-12-26 ENCOUNTER — Other Ambulatory Visit: Payer: Self-pay | Admitting: *Deleted

## 2014-12-26 MED ORDER — METOPROLOL TARTRATE 25 MG PO TABS: 12.5000 mg | ORAL_TABLET | Freq: Two times a day (BID) | ORAL | Status: DC

## 2015-01-29 DIAGNOSIS — E11329 Type 2 diabetes mellitus with mild nonproliferative diabetic retinopathy without macular edema: Secondary | ICD-10-CM | POA: Diagnosis not present

## 2015-01-29 DIAGNOSIS — H43813 Vitreous degeneration, bilateral: Secondary | ICD-10-CM | POA: Diagnosis not present

## 2015-01-29 DIAGNOSIS — H3531 Nonexudative age-related macular degeneration: Secondary | ICD-10-CM | POA: Diagnosis not present

## 2015-02-05 ENCOUNTER — Other Ambulatory Visit (INDEPENDENT_AMBULATORY_CARE_PROVIDER_SITE_OTHER): Payer: Medicare Other | Admitting: *Deleted

## 2015-02-05 DIAGNOSIS — I480 Paroxysmal atrial fibrillation: Secondary | ICD-10-CM

## 2015-02-05 DIAGNOSIS — E785 Hyperlipidemia, unspecified: Secondary | ICD-10-CM | POA: Diagnosis not present

## 2015-02-05 LAB — LIPID PANEL
Cholesterol: 130 mg/dL (ref 0–200)
HDL: 33.3 mg/dL — ABNORMAL LOW (ref >39.00)
LDL Cholesterol: 67 mg/dL (ref 0–99)
NonHDL: 96.7
Total CHOL/HDL Ratio: 4
Triglycerides: 151 mg/dL — ABNORMAL HIGH (ref 0.0–149.0)
VLDL: 30.2 mg/dL (ref 0.0–40.0)

## 2015-02-05 LAB — HEPATIC FUNCTION PANEL
ALT: 10 U/L (ref 0–53)
AST: 14 U/L (ref 0–37)
Albumin: 3.1 g/dL — ABNORMAL LOW (ref 3.5–5.2)
Alkaline Phosphatase: 124 U/L — ABNORMAL HIGH (ref 39–117)
Bilirubin, Direct: 0.1 mg/dL (ref 0.0–0.3)
Total Bilirubin: 0.4 mg/dL (ref 0.2–1.2)
Total Protein: 6.6 g/dL (ref 6.0–8.3)

## 2015-02-05 LAB — TSH: TSH: 2.12 u[IU]/mL (ref 0.35–4.50)

## 2015-02-05 NOTE — Addendum Note (Signed)
Addended by: Eulis Foster on: 02/05/2015 08:28 AM   Modules accepted: Orders

## 2015-03-23 DIAGNOSIS — I10 Essential (primary) hypertension: Secondary | ICD-10-CM | POA: Diagnosis not present

## 2015-03-23 DIAGNOSIS — E1122 Type 2 diabetes mellitus with diabetic chronic kidney disease: Secondary | ICD-10-CM | POA: Diagnosis not present

## 2015-03-23 DIAGNOSIS — M519 Unspecified thoracic, thoracolumbar and lumbosacral intervertebral disc disorder: Secondary | ICD-10-CM | POA: Diagnosis not present

## 2015-03-23 DIAGNOSIS — N183 Chronic kidney disease, stage 3 (moderate): Secondary | ICD-10-CM | POA: Diagnosis not present

## 2015-03-23 DIAGNOSIS — K5190 Ulcerative colitis, unspecified, without complications: Secondary | ICD-10-CM | POA: Diagnosis not present

## 2015-03-23 DIAGNOSIS — E782 Mixed hyperlipidemia: Secondary | ICD-10-CM | POA: Diagnosis not present

## 2015-03-27 ENCOUNTER — Encounter: Payer: Self-pay | Admitting: Cardiology

## 2015-04-13 ENCOUNTER — Telehealth: Payer: Self-pay | Admitting: Cardiology

## 2015-04-13 NOTE — Telephone Encounter (Signed)
Per Gay Filler, [facility], informed [name] that Plavix does not need to be held if only one tooth is being pulled.

## 2015-04-13 NOTE — Telephone Encounter (Signed)
1. What dental office are you calling from? Dr Gae Bon  2. What is your office phone and fax number? [phone]    3. What type of procedure is the patient having performed? Tooth extracted   4. What date is procedure scheduled? Now   5. What is your question (ex. Antibiotics prior to procedure, holding medication-we need to know how long dentist wants pt to hold med)? Blood thinner. Does he need to come off and if so how long.

## 2015-04-13 NOTE — Telephone Encounter (Signed)
F/u    [name] [name] is calling back from Dr Gae Bon office and need an response concerning pt's medication before extraction.

## 2015-05-23 ENCOUNTER — Other Ambulatory Visit: Payer: Self-pay | Admitting: Cardiology

## 2015-06-02 ENCOUNTER — Ambulatory Visit (HOSPITAL_COMMUNITY): Payer: Medicare Other

## 2015-07-08 ENCOUNTER — Encounter: Payer: Self-pay | Admitting: Cardiology

## 2015-07-08 ENCOUNTER — Ambulatory Visit (INDEPENDENT_AMBULATORY_CARE_PROVIDER_SITE_OTHER): Payer: Medicare Other | Admitting: Cardiology

## 2015-07-08 VITALS — BP 124/68 | HR 71 | Ht 66.0 in | Wt 255.1 lb

## 2015-07-08 DIAGNOSIS — I5032 Chronic diastolic (congestive) heart failure: Secondary | ICD-10-CM

## 2015-07-08 DIAGNOSIS — I2510 Atherosclerotic heart disease of native coronary artery without angina pectoris: Secondary | ICD-10-CM | POA: Diagnosis not present

## 2015-07-08 DIAGNOSIS — E785 Hyperlipidemia, unspecified: Secondary | ICD-10-CM

## 2015-07-08 DIAGNOSIS — I480 Paroxysmal atrial fibrillation: Secondary | ICD-10-CM | POA: Diagnosis not present

## 2015-07-08 LAB — BASIC METABOLIC PANEL
BUN: 44 mg/dL — ABNORMAL HIGH (ref 6–23)
CO2: 24 mEq/L (ref 19–32)
Calcium: 8.4 mg/dL (ref 8.4–10.5)
Chloride: 109 mEq/L (ref 96–112)
Creatinine, Ser: 1.77 mg/dL — ABNORMAL HIGH (ref 0.40–1.50)
GFR: 39.51 mL/min — ABNORMAL LOW (ref >60.00)
Glucose, Bld: 123 mg/dL — ABNORMAL HIGH (ref 70–99)
Potassium: 4.6 mEq/L (ref 3.5–5.1)
Sodium: 137 mEq/L (ref 135–145)

## 2015-07-08 LAB — HEPATIC FUNCTION PANEL
ALT: 13 U/L (ref 0–53)
AST: 15 U/L (ref 0–37)
Albumin: 3 g/dL — ABNORMAL LOW (ref 3.5–5.2)
Alkaline Phosphatase: 98 U/L (ref 39–117)
Bilirubin, Direct: 0.1 mg/dL (ref 0.0–0.3)
Total Bilirubin: 0.4 mg/dL (ref 0.2–1.2)
Total Protein: 6.3 g/dL (ref 6.0–8.3)

## 2015-07-08 LAB — BRAIN NATRIURETIC PEPTIDE: Pro B Natriuretic peptide (BNP): 219 pg/mL — ABNORMAL HIGH (ref 0.0–100.0)

## 2015-07-08 LAB — TSH: TSH: 1.89 u[IU]/mL (ref 0.35–4.50)

## 2015-07-08 NOTE — Patient Instructions (Addendum)
Medication Instructions:  Your physician has recommended you make the following change in your medication: 1) STOP PLAVIX  Labwork: TODAY: BMET, TSH, LFTs, BNP  Testing/Procedures: None  Follow-Up: Your physician wants you to follow-up in: 6 months with Dr. Radford Pax. You will receive a reminder letter in the mail two months in advance. If you don't receive a letter, please call our office to schedule the follow-up appointment.   Any Other Special Instructions Will Be Listed Below (If Applicable).

## 2015-07-08 NOTE — Progress Notes (Signed)
Cardiology Office Note   Date:  07/08/2015   ID:  [name] [name], DOB [birth year], MRN [id number]  PCP:  [name], MD    Chief Complaint  Patient presents with  . CAD    pt c/o SOB      History of Present Illness: [name] [name] is a 81 y.o. male with a history of PAF,and chronic SOB. Chest CT angio, done for w/u of pulmonary HTN, showed enlarged pulmonary arteries and atherosclerosis of the coronary arteries. Nuclear stress test showed an intermediate risk stress nuclear study with medium size mild severity ischemia in the LAD territory and a small scar with periinfarct ischemia in the PDA territory (total SDS 6). He underwent cardiac cath showing 2 vessel ASCAD with chronic total occlusion of the LAD with minimal collaterals and severe disease of the large ramus and normal LVF. He underwent DES to the ramus. He is on DAPT. Right heart cath demonstrated no evidence of pulmonary HTN. He denies any chest pain, LE edema, dizziness, palpitations or syncope. He has chronic SOB which significantly limits is activities. Brilinta was changed to Plavix due to SOB. He was recently in [facility] with abdominal pain and SOB. He had been placed on Lasix prior to that for elevated BNP and SOB but breathing did not improve on Lasix. His Hbg was found to be 8.8 and stool positive for blood. He was started on Iron and EGD was done showing esophageal erosions. He did have a bout of PAF with RVR and was discharged on Amio and lopressor. He still complains of chronic SOB but now thinks it is worse.  His SOB was from anemia in the past but has not had it checked recently.     Past Medical History  Diagnosis Date  . Diabetes   . Obesity   . Ulcerative colitis   . HTN (hypertension)   . Dyslipidemia   . GERD (gastroesophageal reflux disease)   . History of peptic ulcer disease   . Ulcerative colitis     a. s/p total colectomy remotely.  . Anemia   . Macular degeneration   .  Kidney stone may 2009    Right hydronephrosis, S/P stone removal  . Small bowel obstruction, partial 2009    Episode  . Shingles     episode  . Compression fracture of L1 lumbar vertebra   . Osteopenia   . Lower back pain   . OA (osteoarthritis) of hip   . CKD (chronic kidney disease), stage III   . Hx of acute renal failure 01/02/10-3/24-11    due to hypovolemic shock, gastroenteritis and dehydration,hospitalized . Did requre a few days of dialysis. Cr at discharge was 1.8  . PAF (paroxysmal atrial fibrillation)     not on long term anticoagulation due to history of heme positive stools and anemia  . Frequent PVCs   . CAD (coronary artery disease) 06/2014    a. 06/2014: abnl nuc. Cath: Totally occluded LAD with faint collaterals (not a candidate for CTO PCI), 90% ramus s/p DES, 50-70% OM2.  . Complication of anesthesia     " during my kidney stone surgery my heart went out of rhythm  . History of blood transfusion   . Atrial flutter     a. s/p RFCA of counterclockwise cavo-tricuspid isthmus dependent atrial flutter 2009 by Dr. Rayann Heman.  . Chronic diastolic CHF (congestive heart failure)  a. 2D Echo 10/23/13: EF 50-55%, basal mid-inf HK, grade 2 DD, mild MR, mod dilated LA, no sig change from prior.    Past Surgical History  Procedure Laterality Date  . Back surgery    . Colon resection    . Hernia repair    . Tonsillectomy    . Cardiac catheterization  06/19/2014  . Coronary angioplasty    . Coronary stent placement  06/19/2014    DES       dr Martinique  . Left and right heart catheterization with coronary angiogram N/A 06/19/2014    Procedure: LEFT AND RIGHT HEART CATHETERIZATION WITH CORONARY ANGIOGRAM;  Surgeon: Peter M Martinique, MD;  Location: [facility] CATH LAB;  Service: Cardiovascular;  Laterality: N/A;     Current Outpatient Prescriptions  Medication Sig Dispense Refill  . amiodarone (PACERONE) 200 MG tablet Take 1 tablet (200 mg total) by mouth daily. 30 tablet 10  . aspirin 81  MG tablet Take 81 mg by mouth daily.    . calcium carbonate (OS-CAL) 600 MG TABS tablet Take 600 mg by mouth daily with breakfast.    . clopidogrel (PLAVIX) 75 MG tablet TAKE 1 TABLET (75 MG TOTAL) BY MOUTH DAILY. 30 tablet 1  . ferrous sulfate 325 (65 FE) MG tablet Take 1 tablet (325 mg total) by mouth 2 (two) times daily with a meal. 30 tablet 3  . GLYBURIDE PO Take 2 mg by mouth daily.    . metoprolol tartrate (LOPRESSOR) 25 MG tablet Take 0.5 tablets (12.5 mg total) by mouth 2 (two) times daily. 30 tablet 5  . omeprazole (PRILOSEC) 20 MG capsule Take 20 mg by mouth daily.    . pioglitazone (ACTOS) 30 MG tablet Take 1 tablet (30 mg total) by mouth daily.    . ranitidine (ZANTAC) 75 MG tablet Take 150 mg by mouth every morning. Patient unsure of exact dosage    . saxagliptin HCl (ONGLYZA) 5 MG TABS tablet Take 1 tablet (5 mg total) by mouth daily. 90 tablet 1  . simvastatin (ZOCOR) 20 MG tablet Take 20 mg by mouth daily.    . tamsulosin (FLOMAX) 0.4 MG CAPS capsule Take 1 capsule (0.4 mg total) by mouth daily after supper. 30 capsule 3   No current facility-administered medications for this visit.    Allergies:   Brilinta; Penicillins; and Sulfa antibiotics    Social History:  The patient  reports that he has never smoked. He has never used smokeless tobacco. He reports that he does not drink alcohol or use illicit drugs.   Family History:  The patient's family history includes Colon cancer in his mother.    ROS:  Please see the history of present illness.   Otherwise, review of systems are positive for none.   All other systems are reviewed and negative.    PHYSICAL EXAM: VS:  BP 124/68 mmHg  Pulse 71  Ht 5' 6"  (1.676 m)  Wt 255 lb 1.9 oz (115.722 kg)  BMI 41.20 kg/m2  SpO2 96% , BMI Body mass index is 41.2 kg/(m^2). GEN: Well nourished, well developed, in no acute distress HEENT: normal Neck: no JVD, carotid bruits, or masses Cardiac: RRR; no murmurs, rubs, or gallops,no  edema  Respiratory:  clear to auscultation bilaterally, normal work of breathing GI: soft, nontender, nondistended, + BS MS: no deformity or atrophy Skin: warm and dry, no rash Neuro:  Strength and sensation are intact Psych: euthymic mood, full affect   EKG:  EKG is not  ordered today.    Recent Labs: 10/14/2014: Pro B Natriuretic peptide (BNP) 459.0* 10/22/2014: B Natriuretic Peptide 384.1* 10/27/2014: BUN 20; Creatinine, Ser 1.34; Potassium 4.8; Sodium 134* 11/27/2014: Hemoglobin 11.2*; Platelets 281.0 02/05/2015: ALT 10; TSH 2.12    Lipid Panel    Component Value Date/Time   CHOL 130 02/05/2015 0829   TRIG 151.0* 02/05/2015 0829   HDL 33.30* 02/05/2015 0829   CHOLHDL 4 02/05/2015 0829   VLDL 30.2 02/05/2015 0829   LDLCALC 67 02/05/2015 0829      Wt Readings from Last 3 Encounters:  07/08/15 255 lb 1.9 oz (115.722 kg)  12/25/14 247 lb 1.9 oz (112.093 kg)  12/01/14 253 lb (114.76 kg)    ASSESSMENT AND PLAN:   CAD in native artery- total LAD, s/p RI DES 06/19/14  Continue DAPT with ASA.  It has been a year since his PCI so I have told him it is ok to stop the Plaivx.  Pulmonary HTN - no evidence of pulmonary HTN on recent right heart cath  Diabetes Type 2 NIDDM  Benign essential HTN Controlled. Continue amlodipine/metoprolol  Frequent PVCs  Dyslipidemia - continue Zocor. LDL at goal.  PAF - maintaining NSR on Amio/BB. He is not a long term anticoagluation candidate due to history of anemia with heme positive stools.  Check LFTs and TSH today.  Chronic SOB which he thinks has gotten worse.  He has gained 8lbs since I saw him last.  I will check a BNP , CBC and TSH.       Current medicines are reviewed at length with the patient today.  The patient does not have concerns regarding medicines.  The following changes have been made:  no change  Labs/ tests ordered today: See above Assessment and Plan No orders of the defined types were placed in this  encounter.     Disposition:   FU with me in 6 months  Signed, Sueanne Margarita, MD  07/08/2015 8:11 AM    [facility] [place], [place], [place]  [id number]

## 2015-07-09 ENCOUNTER — Telehealth: Payer: Self-pay

## 2015-07-09 DIAGNOSIS — I10 Essential (primary) hypertension: Secondary | ICD-10-CM

## 2015-07-09 DIAGNOSIS — I480 Paroxysmal atrial fibrillation: Secondary | ICD-10-CM

## 2015-07-09 MED ORDER — FUROSEMIDE 20 MG PO TABS: 20.0000 mg | ORAL_TABLET | Freq: Every day | ORAL | Status: DC

## 2015-07-09 NOTE — Telephone Encounter (Signed)
Informed patient of results and verbal understanding expressed.  Instructed patient to START LASIX 20 mg daily. Patient understands to take 40 mg total today. Patient to come in for BMET Monday - he cannot come Friday. Patient agrees with treatment plan.

## 2015-07-09 NOTE — Telephone Encounter (Signed)
-----   Message from Sueanne Margarita, MD sent at 07/08/2015  6:53 PM EDT ----- BNP elevated.  Creatinine increased but may be due to CHF.  Please have patient start Lasix 55m tomorrow for 1 dose  then 272mdaily and check BMET on Friday

## 2015-07-13 ENCOUNTER — Other Ambulatory Visit (INDEPENDENT_AMBULATORY_CARE_PROVIDER_SITE_OTHER): Payer: Medicare Other | Admitting: *Deleted

## 2015-07-13 DIAGNOSIS — I10 Essential (primary) hypertension: Secondary | ICD-10-CM | POA: Diagnosis not present

## 2015-07-13 DIAGNOSIS — I480 Paroxysmal atrial fibrillation: Secondary | ICD-10-CM

## 2015-07-13 LAB — BASIC METABOLIC PANEL
BUN: 41 mg/dL — ABNORMAL HIGH (ref 6–23)
CO2: 22 mEq/L (ref 19–32)
Calcium: 8.6 mg/dL (ref 8.4–10.5)
Chloride: 105 mEq/L (ref 96–112)
Creatinine, Ser: 1.64 mg/dL — ABNORMAL HIGH (ref 0.40–1.50)
GFR: 43.14 mL/min — ABNORMAL LOW (ref >60.00)
Glucose, Bld: 138 mg/dL — ABNORMAL HIGH (ref 70–99)
Potassium: 4.3 mEq/L (ref 3.5–5.1)
Sodium: 137 mEq/L (ref 135–145)

## 2015-07-21 ENCOUNTER — Other Ambulatory Visit: Payer: Self-pay | Admitting: Cardiology

## 2015-07-23 DIAGNOSIS — D649 Anemia, unspecified: Secondary | ICD-10-CM | POA: Diagnosis not present

## 2015-07-23 DIAGNOSIS — Z6841 Body Mass Index (BMI) 40.0 and over, adult: Secondary | ICD-10-CM | POA: Diagnosis not present

## 2015-07-23 DIAGNOSIS — I519 Heart disease, unspecified: Secondary | ICD-10-CM | POA: Diagnosis not present

## 2015-07-23 DIAGNOSIS — I10 Essential (primary) hypertension: Secondary | ICD-10-CM | POA: Diagnosis not present

## 2015-07-23 DIAGNOSIS — N183 Chronic kidney disease, stage 3 (moderate): Secondary | ICD-10-CM | POA: Diagnosis not present

## 2015-07-23 DIAGNOSIS — K5190 Ulcerative colitis, unspecified, without complications: Secondary | ICD-10-CM | POA: Diagnosis not present

## 2015-07-23 DIAGNOSIS — I5030 Unspecified diastolic (congestive) heart failure: Secondary | ICD-10-CM | POA: Diagnosis not present

## 2015-07-23 DIAGNOSIS — Z23 Encounter for immunization: Secondary | ICD-10-CM | POA: Diagnosis not present

## 2015-07-23 DIAGNOSIS — K279 Peptic ulcer, site unspecified, unspecified as acute or chronic, without hemorrhage or perforation: Secondary | ICD-10-CM | POA: Diagnosis not present

## 2015-07-23 DIAGNOSIS — I4891 Unspecified atrial fibrillation: Secondary | ICD-10-CM | POA: Diagnosis not present

## 2015-07-23 DIAGNOSIS — E1122 Type 2 diabetes mellitus with diabetic chronic kidney disease: Secondary | ICD-10-CM | POA: Diagnosis not present

## 2015-10-30 DIAGNOSIS — K5190 Ulcerative colitis, unspecified, without complications: Secondary | ICD-10-CM | POA: Diagnosis not present

## 2015-10-30 DIAGNOSIS — Z7984 Long term (current) use of oral hypoglycemic drugs: Secondary | ICD-10-CM | POA: Diagnosis not present

## 2015-10-30 DIAGNOSIS — I2510 Atherosclerotic heart disease of native coronary artery without angina pectoris: Secondary | ICD-10-CM | POA: Diagnosis not present

## 2015-10-30 DIAGNOSIS — Z0000 Encounter for general adult medical examination without abnormal findings: Secondary | ICD-10-CM | POA: Diagnosis not present

## 2015-10-30 DIAGNOSIS — N183 Chronic kidney disease, stage 3 (moderate): Secondary | ICD-10-CM | POA: Diagnosis not present

## 2015-10-30 DIAGNOSIS — K279 Peptic ulcer, site unspecified, unspecified as acute or chronic, without hemorrhage or perforation: Secondary | ICD-10-CM | POA: Diagnosis not present

## 2015-10-30 DIAGNOSIS — Z1389 Encounter for screening for other disorder: Secondary | ICD-10-CM | POA: Diagnosis not present

## 2015-10-30 DIAGNOSIS — E782 Mixed hyperlipidemia: Secondary | ICD-10-CM | POA: Diagnosis not present

## 2015-10-30 DIAGNOSIS — N400 Enlarged prostate without lower urinary tract symptoms: Secondary | ICD-10-CM | POA: Diagnosis not present

## 2015-10-30 DIAGNOSIS — M8580 Other specified disorders of bone density and structure, unspecified site: Secondary | ICD-10-CM | POA: Diagnosis not present

## 2015-10-30 DIAGNOSIS — E1122 Type 2 diabetes mellitus with diabetic chronic kidney disease: Secondary | ICD-10-CM | POA: Diagnosis not present

## 2015-10-30 DIAGNOSIS — I10 Essential (primary) hypertension: Secondary | ICD-10-CM | POA: Diagnosis not present

## 2015-11-13 ENCOUNTER — Ambulatory Visit (INDEPENDENT_AMBULATORY_CARE_PROVIDER_SITE_OTHER): Payer: Medicare Other | Admitting: Podiatry

## 2015-11-13 ENCOUNTER — Encounter: Payer: Self-pay | Admitting: Podiatry

## 2015-11-13 VITALS — BP 149/80 | HR 66 | Resp 16

## 2015-11-13 DIAGNOSIS — M6789 Other specified disorders of synovium and tendon, multiple sites: Secondary | ICD-10-CM | POA: Diagnosis not present

## 2015-11-13 DIAGNOSIS — M76829 Posterior tibial tendinitis, unspecified leg: Secondary | ICD-10-CM

## 2015-11-13 DIAGNOSIS — M722 Plantar fascial fibromatosis: Secondary | ICD-10-CM

## 2015-11-13 NOTE — Progress Notes (Signed)
   Subjective:    Patient ID: [name] [name], male    DOB: Mar 08, 1933, 82 y.o.   MRN: [id number]  HPI this patient presents to the office with chief complaint of painful feet for the last 4 months. He states he experiences sharp, shooting pains noted through the bottom of both of his feet when he is barefoot walking at home. He says that when he is wearing his regular footgear he experiences no pain or discomfort through his feet.  He says for years his feet have been very flat, which has caused him to buy good footgear.  Patient is a diabetic and presents the office for an evaluation of his painful feet    Review of Systems  Hematological: Bruises/bleeds easily.  All other systems reviewed and are negative.      Objective:   Physical Exam GENERAL APPEARANCE: Alert, conversant. Appropriately groomed. No acute distress.  VASCULAR: Pedal pulses palpable at  [facility] and PT bilateral.  Capillary refill time is immediate to all digits,  Normal temperature gradient.  Digital hair growth is present bilateral  NEUROLOGIC: sensation is normal to 5.07 monofilament at 5/5 sites bilateral.  Light touch is intact bilateral, Muscle strength normal.  MUSCULOSKELETAL: acceptable muscle strength, tone and stability bilateral.  Intrinsic muscluature intact bilateral.  Rectus appearance of foot and digits noted bilateral. DJD 1st MCJ left foot.  Plantar fascitis B/L.  PTTD  Noted upon weight bearing.  DERMATOLOGIC: skin color, texture, and turgor are within normal limits.  No preulcerative lesions or ulcers  are seen, no interdigital maceration noted.  No open lesions present.  Digital nails are asymptomatic. No drainage noted.Callus noted plantar aspect left foot under MCJ.         Assessment & Plan:  Plantar fascitis B/L  PTTD B/L  IE  Dispense diabetic insoles for his shoes.   Told him he has severe flatfeet with arthritic changes.  He needs to wear footgear since there is no treatment for his pain when  barefoot.  RTC prn

## 2015-11-16 ENCOUNTER — Other Ambulatory Visit: Payer: Self-pay | Admitting: Internal Medicine

## 2015-12-01 ENCOUNTER — Other Ambulatory Visit: Payer: Self-pay

## 2015-12-01 MED ORDER — OMEPRAZOLE 20 MG PO CPDR
DELAYED_RELEASE_CAPSULE | ORAL | Status: DC
Start: 2015-12-01 — End: 2017-10-27

## 2015-12-08 DIAGNOSIS — M810 Age-related osteoporosis without current pathological fracture: Secondary | ICD-10-CM | POA: Diagnosis not present

## 2015-12-15 DIAGNOSIS — M810 Age-related osteoporosis without current pathological fracture: Secondary | ICD-10-CM | POA: Diagnosis not present

## 2015-12-23 DIAGNOSIS — S46011A Strain of muscle(s) and tendon(s) of the rotator cuff of right shoulder, initial encounter: Secondary | ICD-10-CM | POA: Diagnosis not present

## 2016-01-05 DIAGNOSIS — M75121 Complete rotator cuff tear or rupture of right shoulder, not specified as traumatic: Secondary | ICD-10-CM | POA: Diagnosis not present

## 2016-01-11 NOTE — Progress Notes (Signed)
Cardiology Office Note   Date:  01/12/2016   ID:  [name] [name], DOB February 13, 1933, MRN [id number]  PCP:  [name], MD    Chief Complaint  Patient presents with  . Coronary Artery Disease  . Hypertension  . Congestive Heart Failure      History of Present Illness: [name] [name] is a 82 y.o. male with a history of PAF, 2 vessel ASCAD with chronic total occlusion of the LAD with minimal collaterals and severe disease of the large ramus and normal LVF s/p DES to the ramus. He is on DAPT. He also has PAF, chronic diastolic CHF and he is here today for followup.  He recently took a fall after tripping on the pavement at the orthopedist office.  He has a black eye.  From a cardiac standpoint he is doing well.  He denies any chest pain, LE edema, dizziness, palpitations or syncope. He has chronic SOB which has improved and only occurs when going to the bathroom. Brilinta was changed to Plavix due to SOB.   Past Medical History  Diagnosis Date  . Diabetes ([place])   . Obesity   . Ulcerative colitis ([place])   . HTN (hypertension)   . Dyslipidemia   . GERD (gastroesophageal reflux disease)   . History of peptic ulcer disease   . Ulcerative colitis ([place])     a. s/p total colectomy remotely.  . Anemia   . Macular degeneration   . Kidney stone may 2009    Right hydronephrosis, S/P stone removal  . Small bowel obstruction, partial ([place]) 2009    Episode  . Shingles     episode  . Compression fracture of L1 lumbar vertebra ([name])   . Osteopenia   . Lower back pain   . OA (osteoarthritis) of hip   . CKD (chronic kidney disease), stage III   . Hx of acute renal failure 01/02/10-3/24-11    due to hypovolemic shock, gastroenteritis and dehydration,hospitalized . Did requre a few days of dialysis. Cr at discharge was 1.8  . PAF (paroxysmal atrial fibrillation) ([name])     not on long term anticoagulation due to history of heme positive stools and anemia  . Frequent PVCs    . CAD (coronary artery disease) 06/2014    a. 06/2014: abnl nuc. Cath: Totally occluded LAD with faint collaterals (not a candidate for CTO PCI), 90% ramus s/p DES, 50-70% OM2.  . Complication of anesthesia     " during my kidney stone surgery my heart went out of rhythm  . History of blood transfusion   . Atrial flutter ([place])     a. s/p RFCA of counterclockwise cavo-tricuspid isthmus dependent atrial flutter 2009 by Dr. Rayann Heman.  . Chronic diastolic CHF (congestive heart failure) ([place])     a. 2D Echo 10/23/13: EF 50-55%, basal mid-inf HK, grade 2 DD, mild MR, mod dilated LA, no sig change from prior.  . Kidney disease     Past Surgical History  Procedure Laterality Date  . Back surgery    . Colon resection    . Hernia repair    . Tonsillectomy    . Cardiac catheterization  06/19/2014  . Coronary angioplasty    . Coronary stent placement  06/19/2014    DES       dr Martinique  . Left and right heart catheterization with coronary angiogram N/A 06/19/2014  Procedure: LEFT AND RIGHT HEART CATHETERIZATION WITH CORONARY ANGIOGRAM;  Surgeon: Peter M Martinique, MD;  Location: [facility] CATH LAB;  Service: Cardiovascular;  Laterality: N/A;     Current Outpatient Prescriptions  Medication Sig Dispense Refill  . amiodarone (PACERONE) 200 MG tablet Take 1 tablet (200 mg total) by mouth daily. 30 tablet 10  . aspirin 81 MG tablet Take 81 mg by mouth daily.    . calcium carbonate (OS-CAL) 600 MG TABS tablet Take 600 mg by mouth daily with breakfast.    . ferrous sulfate 325 (65 FE) MG tablet Take 1 tablet (325 mg total) by mouth 2 (two) times daily with a meal. 30 tablet 3  . furosemide (LASIX) 20 MG tablet Take 1 tablet (20 mg total) by mouth daily. 30 tablet 6  . GLYBURIDE PO Take 2 mg by mouth daily.    . metoprolol tartrate (LOPRESSOR) 25 MG tablet Take 0.5 tablets (12.5 mg total) by mouth 2 (two) times daily. 30 tablet 5  . omeprazole (PRILOSEC) 20 MG capsule TAKE 1 CAPSULE (20 MG TOTAL) BY MOUTH DAILY.  30 capsule 6  . pioglitazone (ACTOS) 30 MG tablet Take 1 tablet (30 mg total) by mouth daily.    . ranitidine (ZANTAC) 75 MG tablet Take 150 mg by mouth every morning. Patient unsure of exact dosage    . saxagliptin HCl (ONGLYZA) 5 MG TABS tablet Take 1 tablet (5 mg total) by mouth daily. 90 tablet 1  . simvastatin (ZOCOR) 20 MG tablet Take 20 mg by mouth daily.    . tamsulosin (FLOMAX) 0.4 MG CAPS capsule Take 1 capsule (0.4 mg total) by mouth daily after supper. 30 capsule 3   No current facility-administered medications for this visit.    Allergies:   Brilinta; Penicillins; and Sulfa antibiotics    Social History:  The patient  reports that he has never smoked. He has never used smokeless tobacco. He reports that he does not drink alcohol or use illicit drugs.   Family History:  The patient's family history includes Colon cancer in his mother.    ROS:  Please see the history of present illness.   Otherwise, review of systems are positive for none.   All other systems are reviewed and negative.    PHYSICAL EXAM: VS:  BP 138/78 mmHg  Pulse 74  Ht 5' 6"  (1.676 m)  Wt 248 lb (112.492 kg)  BMI 40.05 kg/m2 , BMI Body mass index is 40.05 kg/(m^2). GEN: Well nourished, well developed, in no acute distress HEENT: normal Neck: no JVD, carotid bruits, or masses Cardiac: RRR; no murmurs, rubs, or gallops,no edema  Respiratory:  clear to auscultation bilaterally, normal work of breathing GI: soft, nontender, nondistended, + BS MS: no deformity or atrophy Skin: warm and dry, no rash Neuro:  Strength and sensation are intact Psych: euthymic mood, full affect   EKG:  EKG was ordered today and showed NSR at 74bpm with IVCD and anterior infarct    Recent Labs: 07/08/2015: ALT 13; Pro B Natriuretic peptide (BNP) 219.0*; TSH 1.89 07/13/2015: BUN 41*; Creatinine, Ser 1.64*; Potassium 4.3; Sodium 137    Lipid Panel    Component Value Date/Time   CHOL 130 02/05/2015 0829   TRIG 151.0*  02/05/2015 0829   HDL 33.30* 02/05/2015 0829   CHOLHDL 4 02/05/2015 0829   VLDL 30.2 02/05/2015 0829   LDLCALC 67 02/05/2015 0829      Wt Readings from Last 3 Encounters:  01/12/16 248 lb (112.492 kg)  07/08/15 255 lb 1.9 oz (115.722 kg)  12/25/14 247 lb 1.9 oz (112.093 kg)    ASSESSMENT AND PLAN:   CAD in native artery- total LAD, s/p RI DES 06/19/14 Continue  ASA  Diabetes Type 2 NIDDM - per PCP  Benign essential HTN Controlled. Continue metoprolol  Frequent PVCs - asymptomatic on BB.  Dyslipidemia - continue Zocor. LDL at goal 01/2015.  Check FLP and ALT.    PAF - maintaining NSR on Amio/BB. He is not a long term anticoagluation candidate due to history of anemia with heme positive stools and esophageal erosions. Check PFTs with DLCO.  Chronic diastolic CHF - appears euvolemic on exam.  Continue BB/Lasix.  Check BMET.  Current medicines are reviewed at length with the patient today.  The patient does not have concerns regarding medicines.  The following changes have been made:  no change  Labs/ tests ordered today: See above Assessment and Plan No orders of the defined types were placed in this encounter.     Disposition:   FU with me in 6 months  Signed, Sueanne Margarita, MD  01/12/2016 8:24 AM    [facility] [place], [place], [place]  [address] Phone: [phone]; Fax: [phone]

## 2016-01-12 ENCOUNTER — Encounter: Payer: Self-pay | Admitting: Cardiology

## 2016-01-12 ENCOUNTER — Ambulatory Visit (INDEPENDENT_AMBULATORY_CARE_PROVIDER_SITE_OTHER): Payer: Medicare Other | Admitting: Cardiology

## 2016-01-12 VITALS — BP 138/78 | HR 74 | Ht 66.0 in | Wt 248.0 lb

## 2016-01-12 DIAGNOSIS — I5032 Chronic diastolic (congestive) heart failure: Secondary | ICD-10-CM | POA: Diagnosis not present

## 2016-01-12 DIAGNOSIS — E785 Hyperlipidemia, unspecified: Secondary | ICD-10-CM

## 2016-01-12 DIAGNOSIS — I10 Essential (primary) hypertension: Secondary | ICD-10-CM | POA: Diagnosis not present

## 2016-01-12 DIAGNOSIS — I2510 Atherosclerotic heart disease of native coronary artery without angina pectoris: Secondary | ICD-10-CM | POA: Diagnosis not present

## 2016-01-12 DIAGNOSIS — I493 Ventricular premature depolarization: Secondary | ICD-10-CM

## 2016-01-12 DIAGNOSIS — I480 Paroxysmal atrial fibrillation: Secondary | ICD-10-CM

## 2016-01-12 DIAGNOSIS — I4891 Unspecified atrial fibrillation: Secondary | ICD-10-CM | POA: Diagnosis not present

## 2016-01-12 LAB — BASIC METABOLIC PANEL WITH GFR
BUN: 47 mg/dL — ABNORMAL HIGH (ref 7–25)
CO2: 21 mmol/L (ref 20–31)
Calcium: 8.6 mg/dL (ref 8.6–10.3)
Chloride: 104 mmol/L (ref 98–110)
Creat: 2.04 mg/dL — ABNORMAL HIGH (ref 0.70–1.11)
Glucose, Bld: 148 mg/dL — ABNORMAL HIGH (ref 65–99)
Potassium: 5.1 mmol/L (ref 3.5–5.3)
Sodium: 135 mmol/L (ref 135–146)

## 2016-01-12 LAB — LIPID PANEL
Cholesterol: 142 mg/dL (ref 125–200)
HDL: 51 mg/dL (ref >=40)
LDL Cholesterol: 70 mg/dL (ref <130)
Total CHOL/HDL Ratio: 2.8 Ratio (ref <=5.0)
Triglycerides: 104 mg/dL (ref <150)
VLDL: 21 mg/dL (ref <30)

## 2016-01-12 LAB — HEPATIC FUNCTION PANEL
ALT: 14 U/L (ref 9–46)
AST: 16 U/L (ref 10–35)
Albumin: 3.2 g/dL — ABNORMAL LOW (ref 3.6–5.1)
Alkaline Phosphatase: 113 U/L (ref 40–115)
Bilirubin, Direct: 0.2 mg/dL (ref <=0.2)
Indirect Bilirubin: 0.5 mg/dL (ref 0.2–1.2)
Total Bilirubin: 0.7 mg/dL (ref 0.2–1.2)
Total Protein: 6.1 g/dL (ref 6.1–8.1)

## 2016-01-12 NOTE — Patient Instructions (Signed)
Medication Instructions:  Your physician recommends that you continue on your current medications as directed. Please refer to the Current Medication list given to you today.   Labwork: TODAY: BMET, LFTs, Lipids  Testing/Procedures: Your physician has recommended that you have a pulmonary function test. Pulmonary Function Tests are a group of tests that measure how well air moves in and out of your lungs.  Dr. Radford Pax recommends you have a CHEST X-RAY.  Follow-Up: Your physician wants you to follow-up in: 6 months with Dr. Radford Pax. You will receive a reminder letter in the mail two months in advance. If you don't receive a letter, please call our office to schedule the follow-up appointment.   Any Other Special Instructions Will Be Listed Below (If Applicable).     If you need a refill on your cardiac medications before your next appointment, please call your pharmacy.

## 2016-01-13 ENCOUNTER — Telehealth: Payer: Self-pay | Admitting: Cardiology

## 2016-01-13 ENCOUNTER — Telehealth: Payer: Self-pay | Admitting: *Deleted

## 2016-01-13 DIAGNOSIS — I10 Essential (primary) hypertension: Secondary | ICD-10-CM

## 2016-01-13 DIAGNOSIS — M25522 Pain in left elbow: Secondary | ICD-10-CM | POA: Diagnosis not present

## 2016-01-13 DIAGNOSIS — M25532 Pain in left wrist: Secondary | ICD-10-CM | POA: Diagnosis not present

## 2016-01-13 DIAGNOSIS — M67912 Unspecified disorder of synovium and tendon, left shoulder: Secondary | ICD-10-CM | POA: Diagnosis not present

## 2016-01-13 MED ORDER — FUROSEMIDE 20 MG PO TABS: 20.0000 mg | ORAL_TABLET | ORAL | Status: DC

## 2016-01-13 NOTE — Telephone Encounter (Signed)
Notes Recorded by Sueanne Margarita, MD on 01/12/2016 at 3:23 PM LFTs and lipids are normal but renal function has worsened - please cut Lasix back to qod and repeat BMET in 1 week   Patient's daughter called to report patient is already taking Lasix every other day. She understands she will be called with Dr. Theodosia Blender medication recommendations.

## 2016-01-13 NOTE — Telephone Encounter (Signed)
[name] is calling to let you know that [name] is already doing what you told him to do ( taking diuretic every other day ). Wants to know if his fluid level is up would the diuretic would need to be up in dosage .  Has been extremely weak and has fallen twice in the last month . Please call   Thanks

## 2016-01-13 NOTE — Telephone Encounter (Signed)
Called pt and had to speak with pt's brother n law, per verbally ok by pt.  They have been made aware of pts labs and that pt needed to cut the Lasix back to qod and repeat bmet 01/20/16.  They verbalized understanding. Medication change made in EPIC Order for BMET and appt scheduled for lab Pt and brother n law verbalized understanding.

## 2016-01-13 NOTE — Telephone Encounter (Signed)
-----   Message from Sueanne Margarita, MD sent at 01/12/2016  3:23 PM EDT ----- LFTs and lipids are normal but renal function has worsened - please cut Lasix back to qod and repeat BMET in 1 week

## 2016-01-14 ENCOUNTER — Encounter (HOSPITAL_COMMUNITY): Payer: Self-pay

## 2016-01-14 ENCOUNTER — Observation Stay (HOSPITAL_COMMUNITY)
Admission: EM | Admit: 2016-01-14 | Discharge: 2016-01-17 | Disposition: A | Discharge status: Home / Self Care | Payer: Medicare Other | Attending: Internal Medicine | Admitting: Internal Medicine

## 2016-01-14 ENCOUNTER — Emergency Department (HOSPITAL_COMMUNITY): Payer: Medicare Other

## 2016-01-14 ENCOUNTER — Observation Stay (HOSPITAL_COMMUNITY): Payer: Medicare Other

## 2016-01-14 DIAGNOSIS — S0083XA Contusion of other part of head, initial encounter: Secondary | ICD-10-CM | POA: Diagnosis not present

## 2016-01-14 DIAGNOSIS — S52125A Nondisplaced fracture of head of left radius, initial encounter for closed fracture: Secondary | ICD-10-CM | POA: Insufficient documentation

## 2016-01-14 DIAGNOSIS — R05 Cough: Secondary | ICD-10-CM

## 2016-01-14 DIAGNOSIS — M25532 Pain in left wrist: Secondary | ICD-10-CM | POA: Diagnosis not present

## 2016-01-14 DIAGNOSIS — Z79899 Other long term (current) drug therapy: Secondary | ICD-10-CM | POA: Diagnosis not present

## 2016-01-14 DIAGNOSIS — I2582 Chronic total occlusion of coronary artery: Secondary | ICD-10-CM | POA: Insufficient documentation

## 2016-01-14 DIAGNOSIS — M542 Cervicalgia: Secondary | ICD-10-CM | POA: Insufficient documentation

## 2016-01-14 DIAGNOSIS — I480 Paroxysmal atrial fibrillation: Secondary | ICD-10-CM | POA: Diagnosis not present

## 2016-01-14 DIAGNOSIS — I517 Cardiomegaly: Secondary | ICD-10-CM | POA: Diagnosis not present

## 2016-01-14 DIAGNOSIS — N183 Chronic kidney disease, stage 3 unspecified: Secondary | ICD-10-CM

## 2016-01-14 DIAGNOSIS — Z87442 Personal history of urinary calculi: Secondary | ICD-10-CM | POA: Insufficient documentation

## 2016-01-14 DIAGNOSIS — M25512 Pain in left shoulder: Secondary | ICD-10-CM | POA: Insufficient documentation

## 2016-01-14 DIAGNOSIS — M25519 Pain in unspecified shoulder: Secondary | ICD-10-CM

## 2016-01-14 DIAGNOSIS — M47812 Spondylosis without myelopathy or radiculopathy, cervical region: Secondary | ICD-10-CM | POA: Insufficient documentation

## 2016-01-14 DIAGNOSIS — R52 Pain, unspecified: Secondary | ICD-10-CM

## 2016-01-14 DIAGNOSIS — M25522 Pain in left elbow: Secondary | ICD-10-CM | POA: Insufficient documentation

## 2016-01-14 DIAGNOSIS — E1121 Type 2 diabetes mellitus with diabetic nephropathy: Secondary | ICD-10-CM

## 2016-01-14 DIAGNOSIS — I2510 Atherosclerotic heart disease of native coronary artery without angina pectoris: Secondary | ICD-10-CM | POA: Diagnosis not present

## 2016-01-14 DIAGNOSIS — I493 Ventricular premature depolarization: Secondary | ICD-10-CM | POA: Diagnosis not present

## 2016-01-14 DIAGNOSIS — I951 Orthostatic hypotension: Secondary | ICD-10-CM

## 2016-01-14 DIAGNOSIS — S022XXA Fracture of nasal bones, initial encounter for closed fracture: Secondary | ICD-10-CM | POA: Diagnosis not present

## 2016-01-14 DIAGNOSIS — M8580 Other specified disorders of bone density and structure, unspecified site: Secondary | ICD-10-CM | POA: Diagnosis not present

## 2016-01-14 DIAGNOSIS — M25511 Pain in right shoulder: Secondary | ICD-10-CM | POA: Diagnosis not present

## 2016-01-14 DIAGNOSIS — E785 Hyperlipidemia, unspecified: Secondary | ICD-10-CM | POA: Insufficient documentation

## 2016-01-14 DIAGNOSIS — W19XXXA Unspecified fall, initial encounter: Secondary | ICD-10-CM

## 2016-01-14 DIAGNOSIS — Z9049 Acquired absence of other specified parts of digestive tract: Secondary | ICD-10-CM | POA: Insufficient documentation

## 2016-01-14 DIAGNOSIS — R29898 Other symptoms and signs involving the musculoskeletal system: Secondary | ICD-10-CM | POA: Diagnosis not present

## 2016-01-14 DIAGNOSIS — R51 Headache: Secondary | ICD-10-CM | POA: Diagnosis not present

## 2016-01-14 DIAGNOSIS — H3530 Unspecified macular degeneration: Secondary | ICD-10-CM | POA: Diagnosis not present

## 2016-01-14 DIAGNOSIS — R531 Weakness: Secondary | ICD-10-CM | POA: Diagnosis not present

## 2016-01-14 DIAGNOSIS — I130 Hypertensive heart and chronic kidney disease with heart failure and stage 1 through stage 4 chronic kidney disease, or unspecified chronic kidney disease: Secondary | ICD-10-CM | POA: Insufficient documentation

## 2016-01-14 DIAGNOSIS — Z880 Allergy status to penicillin: Secondary | ICD-10-CM | POA: Insufficient documentation

## 2016-01-14 DIAGNOSIS — I083 Combined rheumatic disorders of mitral, aortic and tricuspid valves: Secondary | ICD-10-CM | POA: Diagnosis not present

## 2016-01-14 DIAGNOSIS — D649 Anemia, unspecified: Secondary | ICD-10-CM | POA: Insufficient documentation

## 2016-01-14 DIAGNOSIS — Z794 Long term (current) use of insulin: Secondary | ICD-10-CM | POA: Diagnosis not present

## 2016-01-14 DIAGNOSIS — K219 Gastro-esophageal reflux disease without esophagitis: Secondary | ICD-10-CM | POA: Diagnosis not present

## 2016-01-14 DIAGNOSIS — E1122 Type 2 diabetes mellitus with diabetic chronic kidney disease: Secondary | ICD-10-CM | POA: Insufficient documentation

## 2016-01-14 DIAGNOSIS — I447 Left bundle-branch block, unspecified: Secondary | ICD-10-CM | POA: Diagnosis not present

## 2016-01-14 DIAGNOSIS — R296 Repeated falls: Secondary | ICD-10-CM | POA: Insufficient documentation

## 2016-01-14 DIAGNOSIS — Z800 Family history of malignant neoplasm of digestive organs: Secondary | ICD-10-CM | POA: Insufficient documentation

## 2016-01-14 DIAGNOSIS — R55 Syncope and collapse: Principal | ICD-10-CM | POA: Insufficient documentation

## 2016-01-14 DIAGNOSIS — M75102 Unspecified rotator cuff tear or rupture of left shoulder, not specified as traumatic: Secondary | ICD-10-CM | POA: Insufficient documentation

## 2016-01-14 DIAGNOSIS — Z8673 Personal history of transient ischemic attack (TIA), and cerebral infarction without residual deficits: Secondary | ICD-10-CM | POA: Insufficient documentation

## 2016-01-14 DIAGNOSIS — Z8711 Personal history of peptic ulcer disease: Secondary | ICD-10-CM | POA: Diagnosis not present

## 2016-01-14 DIAGNOSIS — Z955 Presence of coronary angioplasty implant and graft: Secondary | ICD-10-CM | POA: Insufficient documentation

## 2016-01-14 DIAGNOSIS — R1084 Generalized abdominal pain: Secondary | ICD-10-CM

## 2016-01-14 DIAGNOSIS — Z8669 Personal history of other diseases of the nervous system and sense organs: Secondary | ICD-10-CM | POA: Diagnosis not present

## 2016-01-14 DIAGNOSIS — Z888 Allergy status to other drugs, medicaments and biological substances status: Secondary | ICD-10-CM | POA: Diagnosis not present

## 2016-01-14 DIAGNOSIS — I509 Heart failure, unspecified: Secondary | ICD-10-CM | POA: Diagnosis not present

## 2016-01-14 DIAGNOSIS — I10 Essential (primary) hypertension: Secondary | ICD-10-CM

## 2016-01-14 DIAGNOSIS — S0003XA Contusion of scalp, initial encounter: Secondary | ICD-10-CM | POA: Insufficient documentation

## 2016-01-14 DIAGNOSIS — E669 Obesity, unspecified: Secondary | ICD-10-CM | POA: Insufficient documentation

## 2016-01-14 DIAGNOSIS — Z933 Colostomy status: Secondary | ICD-10-CM | POA: Insufficient documentation

## 2016-01-14 DIAGNOSIS — K5190 Ulcerative colitis, unspecified, without complications: Secondary | ICD-10-CM | POA: Insufficient documentation

## 2016-01-14 DIAGNOSIS — S0990XA Unspecified injury of head, initial encounter: Secondary | ICD-10-CM | POA: Diagnosis not present

## 2016-01-14 DIAGNOSIS — Z7982 Long term (current) use of aspirin: Secondary | ICD-10-CM | POA: Diagnosis not present

## 2016-01-14 DIAGNOSIS — K922 Gastrointestinal hemorrhage, unspecified: Secondary | ICD-10-CM

## 2016-01-14 DIAGNOSIS — I4819 Other persistent atrial fibrillation: Secondary | ICD-10-CM | POA: Diagnosis present

## 2016-01-14 DIAGNOSIS — E041 Nontoxic single thyroid nodule: Secondary | ICD-10-CM | POA: Diagnosis not present

## 2016-01-14 DIAGNOSIS — R058 Other specified cough: Secondary | ICD-10-CM

## 2016-01-14 DIAGNOSIS — N132 Hydronephrosis with renal and ureteral calculous obstruction: Secondary | ICD-10-CM

## 2016-01-14 DIAGNOSIS — I5032 Chronic diastolic (congestive) heart failure: Secondary | ICD-10-CM

## 2016-01-14 DIAGNOSIS — J320 Chronic maxillary sinusitis: Secondary | ICD-10-CM | POA: Insufficient documentation

## 2016-01-14 DIAGNOSIS — I4892 Unspecified atrial flutter: Secondary | ICD-10-CM | POA: Diagnosis not present

## 2016-01-14 DIAGNOSIS — E0829 Diabetes mellitus due to underlying condition with other diabetic kidney complication: Secondary | ICD-10-CM

## 2016-01-14 DIAGNOSIS — S199XXA Unspecified injury of neck, initial encounter: Secondary | ICD-10-CM | POA: Diagnosis not present

## 2016-01-14 DIAGNOSIS — Z6834 Body mass index (BMI) 34.0-34.9, adult: Secondary | ICD-10-CM | POA: Diagnosis not present

## 2016-01-14 DIAGNOSIS — W19XXXD Unspecified fall, subsequent encounter: Secondary | ICD-10-CM | POA: Diagnosis not present

## 2016-01-14 DIAGNOSIS — W010XXA Fall on same level from slipping, tripping and stumbling without subsequent striking against object, initial encounter: Secondary | ICD-10-CM | POA: Diagnosis not present

## 2016-01-14 DIAGNOSIS — S3993XA Unspecified injury of pelvis, initial encounter: Secondary | ICD-10-CM | POA: Diagnosis not present

## 2016-01-14 DIAGNOSIS — S0083XD Contusion of other part of head, subsequent encounter: Secondary | ICD-10-CM | POA: Diagnosis not present

## 2016-01-14 LAB — CBC WITH DIFFERENTIAL/PLATELET
Band Neutrophils: 0 %
Basophils Absolute: 0 10*3/uL (ref 0.0–0.1)
Basophils Relative: 0 %
Blasts: 0 %
Eosinophils Absolute: 0 10*3/uL (ref 0.0–0.7)
Eosinophils Relative: 0 %
HCT: 31.5 % — ABNORMAL LOW (ref 39.0–52.0)
Hemoglobin: 10.2 g/dL — ABNORMAL LOW (ref 13.0–17.0)
Lymphocytes Relative: 11 %
Lymphs Abs: 1 10*3/uL (ref 0.7–4.0)
MCH: 30.9 pg (ref 26.0–34.0)
MCHC: 32.4 g/dL (ref 30.0–36.0)
MCV: 95.5 fL (ref 78.0–100.0)
Metamyelocytes Relative: 0 %
Monocytes Absolute: 0.7 10*3/uL (ref 0.1–1.0)
Monocytes Relative: 7 %
Myelocytes: 0 %
Neutro Abs: 7.8 10*3/uL — ABNORMAL HIGH (ref 1.7–7.7)
Neutrophils Relative %: 82 %
Other: 0 %
Platelets: 219 10*3/uL (ref 150–400)
Promyelocytes Absolute: 0 %
RBC: 3.3 MIL/uL — ABNORMAL LOW (ref 4.22–5.81)
RDW: 14.5 % (ref 11.5–15.5)
WBC: 9.5 10*3/uL (ref 4.0–10.5)
nRBC: 0 /100 WBC

## 2016-01-14 LAB — TROPONIN I: Troponin I: 0.03 ng/mL (ref <0.031)

## 2016-01-14 LAB — BASIC METABOLIC PANEL
Anion gap: 10 (ref 5–15)
BUN: 41 mg/dL — ABNORMAL HIGH (ref 6–20)
CO2: 20 mmol/L — ABNORMAL LOW (ref 22–32)
Calcium: 8.4 mg/dL — ABNORMAL LOW (ref 8.9–10.3)
Chloride: 107 mmol/L (ref 101–111)
Creatinine, Ser: 1.87 mg/dL — ABNORMAL HIGH (ref 0.61–1.24)
GFR calc Af Amer: 37 mL/min — ABNORMAL LOW (ref >60)
GFR calc non Af Amer: 32 mL/min — ABNORMAL LOW (ref >60)
Glucose, Bld: 163 mg/dL — ABNORMAL HIGH (ref 65–99)
Potassium: 4.5 mmol/L (ref 3.5–5.1)
Sodium: 137 mmol/L (ref 135–145)

## 2016-01-14 LAB — BRAIN NATRIURETIC PEPTIDE: B Natriuretic Peptide: 186.8 pg/mL — ABNORMAL HIGH (ref 0.0–100.0)

## 2016-01-14 MED ORDER — CALCIUM CARBONATE 1250 (500 CA) MG PO TABS
1250.0000 mg | ORAL_TABLET | Freq: Every day | ORAL | Status: DC
Start: 2016-01-15 — End: 2016-01-17
  Administered 2016-01-15 – 2016-01-17 (×3): 1250 mg via ORAL
  Filled 2016-01-14 (×3): qty 1

## 2016-01-14 MED ORDER — LINAGLIPTIN 5 MG PO TABS
5.0000 mg | ORAL_TABLET | Freq: Every day | ORAL | Status: DC
  Administered 2016-01-15 – 2016-01-17 (×3): 5 mg via ORAL
  Filled 2016-01-14 (×3): qty 1

## 2016-01-14 MED ORDER — ASPIRIN EC 81 MG PO TBEC
81.0000 mg | DELAYED_RELEASE_TABLET | Freq: Every day | ORAL | Status: DC
  Administered 2016-01-15 – 2016-01-17 (×3): 81 mg via ORAL
  Filled 2016-01-14 (×3): qty 1

## 2016-01-14 MED ORDER — PANTOPRAZOLE SODIUM 40 MG PO TBEC
40.0000 mg | DELAYED_RELEASE_TABLET | Freq: Every day | ORAL | Status: DC
Start: 2016-01-15 — End: 2016-01-17
  Administered 2016-01-15 – 2016-01-17 (×3): 40 mg via ORAL
  Filled 2016-01-14 (×3): qty 1

## 2016-01-14 MED ORDER — AMIODARONE HCL 200 MG PO TABS
200.0000 mg | ORAL_TABLET | Freq: Every day | ORAL | Status: DC
  Administered 2016-01-15 – 2016-01-17 (×3): 200 mg via ORAL
  Filled 2016-01-14 (×3): qty 1

## 2016-01-14 MED ORDER — FERROUS SULFATE 325 (65 FE) MG PO TABS
325.0000 mg | ORAL_TABLET | Freq: Two times a day (BID) | ORAL | Status: DC
  Administered 2016-01-15 – 2016-01-17 (×5): 325 mg via ORAL
  Filled 2016-01-14 (×5): qty 1

## 2016-01-14 MED ORDER — GLYBURIDE 1.25 MG PO TABS
2.0000 mg | ORAL_TABLET | Freq: Two times a day (BID) | ORAL | Status: DC
  Filled 2016-01-14: qty 1.5

## 2016-01-14 MED ORDER — ENOXAPARIN SODIUM 40 MG/0.4ML ~~LOC~~ SOLN
40.0000 mg | Freq: Every day | SUBCUTANEOUS | Status: DC
  Administered 2016-01-15 – 2016-01-17 (×3): 40 mg via SUBCUTANEOUS
  Filled 2016-01-14 (×3): qty 0.4

## 2016-01-14 MED ORDER — SIMVASTATIN 20 MG PO TABS
20.0000 mg | ORAL_TABLET | Freq: Every day | ORAL | Status: DC
  Administered 2016-01-15 – 2016-01-17 (×3): 20 mg via ORAL
  Filled 2016-01-14 (×3): qty 1

## 2016-01-14 MED ORDER — PIOGLITAZONE HCL 30 MG PO TABS
30.0000 mg | ORAL_TABLET | Freq: Every day | ORAL | Status: DC
  Administered 2016-01-15 – 2016-01-17 (×3): 30 mg via ORAL
  Filled 2016-01-14 (×4): qty 1

## 2016-01-14 MED ORDER — FUROSEMIDE 20 MG PO TABS
20.0000 mg | ORAL_TABLET | ORAL | Status: DC
  Administered 2016-01-15 – 2016-01-17 (×2): 20 mg via ORAL
  Filled 2016-01-14 (×3): qty 1

## 2016-01-14 MED ORDER — TAMSULOSIN HCL 0.4 MG PO CAPS
0.4000 mg | ORAL_CAPSULE | Freq: Every day | ORAL | Status: DC
  Administered 2016-01-15 – 2016-01-16 (×2): 0.4 mg via ORAL
  Filled 2016-01-14 (×2): qty 1

## 2016-01-14 MED ORDER — INSULIN ASPART 100 UNIT/ML ~~LOC~~ SOLN
0.0000 [IU] | Freq: Three times a day (TID) | SUBCUTANEOUS | Status: DC
  Administered 2016-01-15: 2 [IU] via SUBCUTANEOUS
  Administered 2016-01-15 – 2016-01-16 (×2): 1 [IU] via SUBCUTANEOUS

## 2016-01-14 MED ORDER — FAMOTIDINE 20 MG PO TABS
20.0000 mg | ORAL_TABLET | Freq: Two times a day (BID) | ORAL | Status: DC
  Administered 2016-01-15 – 2016-01-17 (×5): 20 mg via ORAL
  Filled 2016-01-14 (×5): qty 1

## 2016-01-14 NOTE — ED Notes (Signed)
MD at the bedside  

## 2016-01-14 NOTE — ED Notes (Signed)
Patient here with multiple bruises to face with left arm pain and head pain after falling face first on Monday onto concrete. No loc. MD concerned that patient has neck injury, c-collar placed on arrival

## 2016-01-14 NOTE — ED Notes (Signed)
Ambulated pt out to the hallway and back with minimal assistance, Pt states "It is just when I stand up I feel wobbly" Pts sister stating he cant take a shower or empty his ostomy bag without feeling dizzy and falling, also stated he cant use his arms. Pt states that he uses a walker at home.

## 2016-01-14 NOTE — Telephone Encounter (Signed)
Patient's DPR st she does not think patient has any swelling, other than his face and arm from when he fell. She also st patient is currently at [facility] ED getting treatment for a fall (the ED notes report mild LE edema).  She reports they may do a head scan and other testing. She is upset because the doctor said [name] [name] will probably have to go to a nursing home when he is discharged.   To Dr. Radford Pax for Lasix recommendations.

## 2016-01-14 NOTE — H&P (Signed)
[facility] History and Physical  [name] [name] NGE:[id number] DOB: 1933/06/22 DOA: 01/14/2016  Referring physician: Dr. Regenia Skeeter. PCP: [name], MD  Specialists: [facility] cardiology.  Chief Complaint: Falls.  HPI: [name] [name] is a 82 y.o. male with history of diastolic CHF, CAD, diabetes mellitus, chronic disease chronic anemia and ulcerative colitis and paroxysmal atrial fibrillation who was referred to the ER by patient's primary care physician as patient has been having recurrent falls and has bruised his face after his last fall. On exam patient has large echo medic 80 on his face surrounding his periorbital area and maxillary area. Patient also has sustained an injury to his right shoulder which he has followed up with his primary orthopedic surgeon last week. Patient was referred to the physical therapy but patient had a fall during the process. On exam patient also has left forearm and wrist swelling which is new. Patient denies any pelvic pain. Denies any chest pain palpitation has shortness of breath which patient states is chronic. Chest x-ray does show congestion. CAT scan of the head and neck and C-spine are unremarkable. Patient is more able to move all extremities except for the limitation due to the shoulder pain. Patient probably will need rehabilitation placement. Patient states couple of times he fell when his legs gave away. Last time he fell when he tripped on something. Denies losing consciousness at anytime.   Review of Systems: As presented in the history of presenting illness, rest negative.  Past Medical History  Diagnosis Date  . Diabetes ([place])   . Obesity   . Ulcerative colitis ([place])   . HTN (hypertension)   . Dyslipidemia   . GERD (gastroesophageal reflux disease)   . History of peptic ulcer disease   . Ulcerative colitis ([place])     a. s/p total colectomy remotely.  . Anemia   . Macular degeneration   . Kidney stone may 2009    Right hydronephrosis,  S/P stone removal  . Small bowel obstruction, partial ([place]) 2009    Episode  . Shingles     episode  . Compression fracture of L1 lumbar vertebra ([name])   . Osteopenia   . Lower back pain   . OA (osteoarthritis) of hip   . CKD (chronic kidney disease), stage III   . Hx of acute renal failure 01/02/10-3/24-11    due to hypovolemic shock, gastroenteritis and dehydration,hospitalized . Did requre a few days of dialysis. Cr at discharge was 1.8  . PAF (paroxysmal atrial fibrillation) ([name])     not on long term anticoagulation due to history of heme positive stools and anemia  . Frequent PVCs   . CAD (coronary artery disease) 06/2014    a. 06/2014: abnl nuc. Cath: Totally occluded LAD with faint collaterals (not a candidate for CTO PCI), 90% ramus s/p DES, 50-70% OM2.  . Complication of anesthesia     " during my kidney stone surgery my heart went out of rhythm  . History of blood transfusion   . Atrial flutter ([place])     a. s/p RFCA of counterclockwise cavo-tricuspid isthmus dependent atrial flutter 2009 by Dr. Rayann Heman.  . Chronic diastolic CHF (congestive heart failure) ([place])     a. 2D Echo 10/23/13: EF 50-55%, basal mid-inf HK, grade 2 DD, mild MR, mod dilated LA, no sig change from prior.  . Kidney disease    Past Surgical History  Procedure Laterality Date  . Back surgery    . Colon resection    .  Hernia repair    . Tonsillectomy    . Cardiac catheterization  06/19/2014  . Coronary angioplasty    . Coronary stent placement  06/19/2014    DES       dr Martinique  . Left and right heart catheterization with coronary angiogram N/A 06/19/2014    Procedure: LEFT AND RIGHT HEART CATHETERIZATION WITH CORONARY ANGIOGRAM;  Surgeon: Peter M Martinique, MD;  Location: [facility] CATH LAB;  Service: Cardiovascular;  Laterality: N/A;   Social History:  reports that he has never smoked. He has never used smokeless tobacco. He reports that he does not drink alcohol or use illicit drugs. Where does patient live At  home. Can patient participate in ADLs? Yes.  Allergies  Allergen Reactions  . Brilinta [Ticagrelor]     Changed to Plavix due to SOB  . Penicillins Rash    Has patient had a PCN reaction causing immediate rash, facial/tongue/throat swelling, SOB or lightheadedness with hypotension:YES Has patient had a PCN reaction causing severe rash involving mucus membranes or skin necrosis: NO Has patient had a PCN reaction that required hospitalization No Has patient had a PCN reaction occurring within the last 10 years: NO If all of the above answers are "NO", then may proceed with Cephalosporin use.  . Sulfa Antibiotics Rash    Family History:  Family History  Problem Relation Age of Onset  . Colon cancer Mother       Prior to Admission medications   Medication Sig Start Date End Date Taking? Authorizing Provider  amiodarone (PACERONE) 200 MG tablet Take 1 tablet (200 mg total) by mouth daily. 11/27/14  Yes Sueanne Margarita, MD  aspirin 81 MG tablet Take 81 mg by mouth daily.   Yes Historical Provider, MD  calcium carbonate (OS-CAL) 600 MG TABS tablet Take 600 mg by mouth daily with breakfast.   Yes Historical Provider, MD  ferrous sulfate 325 (65 FE) MG tablet Take 1 tablet (325 mg total) by mouth 2 (two) times daily with a meal. 10/27/14  Yes Florencia Reasons, MD  furosemide (LASIX) 20 MG tablet Take 1 tablet (20 mg total) by mouth every other day. 01/13/16  Yes Sueanne Margarita, MD  GLYBURIDE PO Take 2 mg by mouth 2 (two) times daily. Verified with [facility] it is 6m   Yes Historical Provider, MD  omeprazole (PRILOSEC) 20 MG capsule TAKE 1 CAPSULE (20 MG TOTAL) BY MOUTH DAILY. 12/01/15  Yes JIrene Shipper MD  pioglitazone (ACTOS) 30 MG tablet Take 1 tablet (30 mg total) by mouth daily. 11/05/14  Yes Dayna N Dunn, PA-C  ranitidine (ZANTAC) 75 MG tablet Take 150 mg by mouth every morning. Patient unsure of exact dosage   Yes Historical Provider, MD  saxagliptin HCl (ONGLYZA) 5 MG TABS tablet Take 1 tablet (5 mg  total) by mouth daily. 06/20/14  Yes MTanda Rockers MD  simvastatin (ZOCOR) 20 MG tablet Take 20 mg by mouth daily.   Yes Historical Provider, MD  tamsulosin (FLOMAX) 0.4 MG CAPS capsule Take 1 capsule (0.4 mg total) by mouth daily after supper. 10/27/14  Yes FFlorencia Reasons MD  metoprolol tartrate (LOPRESSOR) 25 MG tablet Take 0.5 tablets (12.5 mg total) by mouth 2 (two) times daily. Patient not taking: Reported on 01/14/2016 12/26/14   TSueanne Margarita MD    Physical Exam: Filed Vitals:   01/14/16 2000 01/14/16 2100 01/14/16 2130 01/14/16 2200  BP: 104/49 131/64 109/55 123/99  Pulse: 71 73 80 74  Temp:  TempSrc:      Resp: 21 21 21 15   SpO2: 96% 96% 97% 95%     General:  Moderately built and nourished.  Eyes: Anicteric no pallor.  ENT: Ecchymotic area on the last part of the upper face.  Neck: No neck rigidity or mass felt. No JVD appreciated.  Cardiovascular: S1-S2 heard.  Respiratory: No rhonchi or crepitations.  Abdomen: Soft nontender bowel sounds present.  Skin: Ecchymotic area on the face.  Musculoskeletal: Unable to move his right shoulder because of pain. There is swelling of the left wrist and forearm.  Psychiatric: Appears normal.  Neurologic: Alert awake oriented to time place and person. Moves all extremities.  Labs on Admission:  Basic Metabolic Panel:  Recent Labs Lab 01/12/16 0853 01/14/16 1742  NA 135 137  K 5.1 4.5  CL 104 107  CO2 21 20*  GLUCOSE 148* 163*  BUN 47* 41*  CREATININE 2.04* 1.87*  CALCIUM 8.6 8.4*   Liver Function Tests:  Recent Labs Lab 01/12/16 0853  AST 16  ALT 14  ALKPHOS 113  BILITOT 0.7  PROT 6.1  ALBUMIN 3.2*   No results for input(s): LIPASE, AMYLASE in the last 168 hours. No results for input(s): AMMONIA in the last 168 hours. CBC:  Recent Labs Lab 01/14/16 1742  WBC 9.5  NEUTROABS 7.8*  HGB 10.2*  HCT 31.5*  MCV 95.5  PLT 219   Cardiac Enzymes:  Recent Labs Lab 01/14/16 1742  TROPONINI 0.03     BNP (last 3 results)  Recent Labs  01/14/16 1742  BNP 186.8*    ProBNP (last 3 results)  Recent Labs  07/08/15 0846  PROBNP 219.0*    CBG: No results for input(s): GLUCAP in the last 168 hours.  Radiological Exams on Admission: Dg Chest 2 View  01/14/2016  CLINICAL DATA:  Weakness EXAM: CHEST  2 VIEW COMPARISON:  10/22/2014 chest radiograph. FINDINGS: Stable cardiomediastinal silhouette with mild cardiomegaly. No pneumothorax. No pleural effusion. Mild pulmonary edema. Mild bibasilar atelectasis. IMPRESSION: 1. Mild congestive heart failure. 2. Mild bibasilar atelectasis. Electronically Signed   By: Ilona Sorrel M.D.   On: 01/14/2016 18:06   Ct Head Wo Contrast  01/14/2016  CLINICAL DATA:  PT FELL FLAT ON FACE THIS AFTERNOON LARGE HEMATOMAS LT SIDE OF CHEEK, TEMPORAL, PARIETAL, FRONTAL PT COLLARED NECK PAIN MOSTLY ON LEFT EXAM: CT HEAD WITHOUT CONTRAST CT MAXILLOFACIAL WITHOUT CONTRAST CT CERVICAL SPINE WITHOUT CONTRAST TECHNIQUE: Multidetector CT imaging of the head, cervical spine, and maxillofacial structures were performed using the standard protocol without intravenous contrast. Multiplanar CT image reconstructions of the cervical spine and maxillofacial structures were also generated. COMPARISON:  Head CT, 05/26/2008 FINDINGS: CT HEAD FINDINGS Left frontal scalp hematoma.  No skull fracture. The ventricles are normal in size, for this patient's age, and normal in configuration. There are no parenchymal masses or mass effect. There is no evidence of an infarct. There are no extra-axial masses or abnormal fluid collections. There is no intracranial hemorrhage. CT MAXILLOFACIAL FINDINGS There are nondisplaced fractures at the base of the nasal bridge. These may not be acute, but this appears be a change from the prior CT. No other evidence of an acute fracture. There is an old left medial orbital wall fracture. Right maxillary sinuses chronically opacified as it was on the prior  study. Mild mucosal thickening noted along several of the right ethmoid air cells. The mastoid air cells and middle ear cavities are clear. Status post bilateral cataract surgery. Globes and  orbits are otherwise unremarkable. No soft tissue masses or adenopathy. No hematoma. There are degenerative changes of both temporomandibular joints. CT CERVICAL SPINE FINDINGS No fracture.  No spondylolisthesis. Mild loss of disc height at C5-C6 and C6-C7. There are small endplate osteophytes at these levels. There is facet degenerative change most evident on the left at C2-C3 and C3-C4 and on the right at C4-C5. Soft tissues are unremarkable other than 12 mm left thyroid nodule and carotid vascular calcifications. Lung apices are clear. IMPRESSION: HEAD CT: No acute intracranial abnormalities. No skull fracture. Left frontal scalp hematoma. MAXILLOFACIAL CT: Nondisplaced fractures at the base of the nasal bridge. These may not be acute but should be considered acute if they correlate with the site of injury/tenderness. No other evidence of a fracture. CERVICAL CT:  No fracture or acute finding. Electronically Signed   By: Lajean Manes M.D.   On: 01/14/2016 19:01   Ct Cervical Spine Wo Contrast  01/14/2016  CLINICAL DATA:  PT FELL FLAT ON FACE THIS AFTERNOON LARGE HEMATOMAS LT SIDE OF CHEEK, TEMPORAL, PARIETAL, FRONTAL PT COLLARED NECK PAIN MOSTLY ON LEFT EXAM: CT HEAD WITHOUT CONTRAST CT MAXILLOFACIAL WITHOUT CONTRAST CT CERVICAL SPINE WITHOUT CONTRAST TECHNIQUE: Multidetector CT imaging of the head, cervical spine, and maxillofacial structures were performed using the standard protocol without intravenous contrast. Multiplanar CT image reconstructions of the cervical spine and maxillofacial structures were also generated. COMPARISON:  Head CT, 05/26/2008 FINDINGS: CT HEAD FINDINGS Left frontal scalp hematoma.  No skull fracture. The ventricles are normal in size, for this patient's age, and normal in configuration. There  are no parenchymal masses or mass effect. There is no evidence of an infarct. There are no extra-axial masses or abnormal fluid collections. There is no intracranial hemorrhage. CT MAXILLOFACIAL FINDINGS There are nondisplaced fractures at the base of the nasal bridge. These may not be acute, but this appears be a change from the prior CT. No other evidence of an acute fracture. There is an old left medial orbital wall fracture. Right maxillary sinuses chronically opacified as it was on the prior study. Mild mucosal thickening noted along several of the right ethmoid air cells. The mastoid air cells and middle ear cavities are clear. Status post bilateral cataract surgery. Globes and orbits are otherwise unremarkable. No soft tissue masses or adenopathy. No hematoma. There are degenerative changes of both temporomandibular joints. CT CERVICAL SPINE FINDINGS No fracture.  No spondylolisthesis. Mild loss of disc height at C5-C6 and C6-C7. There are small endplate osteophytes at these levels. There is facet degenerative change most evident on the left at C2-C3 and C3-C4 and on the right at C4-C5. Soft tissues are unremarkable other than 12 mm left thyroid nodule and carotid vascular calcifications. Lung apices are clear. IMPRESSION: HEAD CT: No acute intracranial abnormalities. No skull fracture. Left frontal scalp hematoma. MAXILLOFACIAL CT: Nondisplaced fractures at the base of the nasal bridge. These may not be acute but should be considered acute if they correlate with the site of injury/tenderness. No other evidence of a fracture. CERVICAL CT:  No fracture or acute finding. Electronically Signed   By: Lajean Manes M.D.   On: 01/14/2016 19:01   Ct Maxillofacial Wo Cm  01/14/2016  CLINICAL DATA:  PT FELL FLAT ON FACE THIS AFTERNOON LARGE HEMATOMAS LT SIDE OF CHEEK, TEMPORAL, PARIETAL, FRONTAL PT COLLARED NECK PAIN MOSTLY ON LEFT EXAM: CT HEAD WITHOUT CONTRAST CT MAXILLOFACIAL WITHOUT CONTRAST CT CERVICAL SPINE  WITHOUT CONTRAST TECHNIQUE: Multidetector CT imaging of the  head, cervical spine, and maxillofacial structures were performed using the standard protocol without intravenous contrast. Multiplanar CT image reconstructions of the cervical spine and maxillofacial structures were also generated. COMPARISON:  Head CT, 05/26/2008 FINDINGS: CT HEAD FINDINGS Left frontal scalp hematoma.  No skull fracture. The ventricles are normal in size, for this patient's age, and normal in configuration. There are no parenchymal masses or mass effect. There is no evidence of an infarct. There are no extra-axial masses or abnormal fluid collections. There is no intracranial hemorrhage. CT MAXILLOFACIAL FINDINGS There are nondisplaced fractures at the base of the nasal bridge. These may not be acute, but this appears be a change from the prior CT. No other evidence of an acute fracture. There is an old left medial orbital wall fracture. Right maxillary sinuses chronically opacified as it was on the prior study. Mild mucosal thickening noted along several of the right ethmoid air cells. The mastoid air cells and middle ear cavities are clear. Status post bilateral cataract surgery. Globes and orbits are otherwise unremarkable. No soft tissue masses or adenopathy. No hematoma. There are degenerative changes of both temporomandibular joints. CT CERVICAL SPINE FINDINGS No fracture.  No spondylolisthesis. Mild loss of disc height at C5-C6 and C6-C7. There are small endplate osteophytes at these levels. There is facet degenerative change most evident on the left at C2-C3 and C3-C4 and on the right at C4-C5. Soft tissues are unremarkable other than 12 mm left thyroid nodule and carotid vascular calcifications. Lung apices are clear. IMPRESSION: HEAD CT: No acute intracranial abnormalities. No skull fracture. Left frontal scalp hematoma. MAXILLOFACIAL CT: Nondisplaced fractures at the base of the nasal bridge. These may not be acute but should  be considered acute if they correlate with the site of injury/tenderness. No other evidence of a fracture. CERVICAL CT:  No fracture or acute finding. Electronically Signed   By: Lajean Manes M.D.   On: 01/14/2016 19:01    EKG: Independently reviewed. Normal sinus rhythm with LBBB.  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 Near syncope/fall - at this time we will closely monitor telemetry. MRI of the C-spine and brain are pending. Get physical therapy consult social work consult for placement. If workup was largely unremarkable then patient probably will need rehabilitation placement. 2. History of diastolic CHF with last EF measured 50-50% with grade 2 diastolic dysfunction echo was done in January 2016 - patient's chest x-ray does show congestion and patient looks mildly fluid overloaded, which I have ordered 1 dose of IV Lasix and continue with home dose of Lasix 20 mg by mouth every other day. 3. CAD status post drug-eluting stent in September 2015 - denies any chest pain. Continue aspirin and beta blockers and statins. 4. Paroxysmal atrial fibrillation - presently in sinus rhythm and on amiodarone and beta blockers. Patient's chads 2 vasC score is 5. Patient is not on anticoagulants and second history of GI bleed and now has falls. 5. Chronic disease stage III - closely follow metabolic panel. 6. Diabetes mellitus type 2 on glyburide and Januvia. Follow CBGs. 7. History of GI bleed - follow CBC closely patient does  have anemia. Hemoglobin appears to be at baseline.   DVT Prophylaxis Lovenox.  Code Status: DO NOT RESUSCITATE. This was requested by patient at  this time.  Family Communication: Discussed with patient.  Disposition Plan: Admit for observation.    Gara Kincade N. [facility] Pager [phone].  If 7PM-7AM, please contact night-coverage [email or web address] Password [facility] 01/14/2016, 10:48 PM

## 2016-01-14 NOTE — ED Notes (Signed)
[name] [name], Transporter came to take patient to CT. Pt in Xray.

## 2016-01-14 NOTE — ED Notes (Signed)
Patient still in MRI.

## 2016-01-14 NOTE — ED Notes (Signed)
Received call from xray, patient refused

## 2016-01-14 NOTE — ED Notes (Signed)
Phlebotomy at the bedside  

## 2016-01-14 NOTE — ED Provider Notes (Signed)
CSN: [id number]     Arrival date & time 01/14/16  1524 History   First MD Initiated Contact with Patient 01/14/16 1651     Chief Complaint  Patient presents with  . multiple falls      (Consider location/radiation/quality/duration/timing/severity/associated sxs/prior Treatment) HPI  82 year old male presents with his sisters. Sent from [place] for weakness and a fall 3 days ago. Patient states he tripped and fell on a brick going to the orthopedist office. 1 month prior he fell and injured his bilateral arms. No fractures per family. Mild headache currently. Significant bruising to face/forehead. Placed in C-col[facility] because Dr. Delfina Redwood was worried about a neck injury or spinal cord problem causing his weakness. Per sisters patient was having weakness on neurologic testing in his left leg at the office. Patient has had trouble ambulating and unsteadiness for months if not years. Lives at home by himself. Multiple chronic problems such as diabetes, colostomy, CHF, afib. Sisters feel like he can't go home because he can't care for himself. The would like him to go to a rehab facility.   Past Medical History  Diagnosis Date  . Diabetes ([place])   . Obesity   . Ulcerative colitis ([place])   . HTN (hypertension)   . Dyslipidemia   . GERD (gastroesophageal reflux disease)   . History of peptic ulcer disease   . Ulcerative colitis ([place])     a. s/p total colectomy remotely.  . Anemia   . Macular degeneration   . Kidney stone may 2009    Right hydronephrosis, S/P stone removal  . Small bowel obstruction, partial ([place]) 2009    Episode  . Shingles     episode  . Compression fracture of L1 lumbar vertebra ([name])   . Osteopenia   . Lower back pain   . OA (osteoarthritis) of hip   . CKD (chronic kidney disease), stage III   . Hx of acute renal failure 01/02/10-3/24-11    due to hypovolemic shock, gastroenteritis and dehydration,hospitalized . Did requre a few days of dialysis. Cr at discharge was 1.8  .  PAF (paroxysmal atrial fibrillation) ([name])     not on long term anticoagulation due to history of heme positive stools and anemia  . Frequent PVCs   . CAD (coronary artery disease) 06/2014    a. 06/2014: abnl nuc. Cath: Totally occluded LAD with faint collaterals (not a candidate for CTO PCI), 90% ramus s/p DES, 50-70% OM2.  . Complication of anesthesia     " during my kidney stone surgery my heart went out of rhythm  . History of blood transfusion   . Atrial flutter ([place])     a. s/p RFCA of counterclockwise cavo-tricuspid isthmus dependent atrial flutter 2009 by Dr. Rayann Heman.  . Chronic diastolic CHF (congestive heart failure) ([place])     a. 2D Echo 10/23/13: EF 50-55%, basal mid-inf HK, grade 2 DD, mild MR, mod dilated LA, no sig change from prior.  . Kidney disease    Past Surgical History  Procedure Laterality Date  . Back surgery    . Colon resection    . Hernia repair    . Tonsillectomy    . Cardiac catheterization  06/19/2014  . Coronary angioplasty    . Coronary stent placement  06/19/2014    DES       dr Martinique  . Left and right heart catheterization with coronary angiogram N/A 06/19/2014    Procedure: LEFT AND RIGHT HEART CATHETERIZATION WITH CORONARY ANGIOGRAM;  Surgeon: Peter M Martinique, MD;  Location: [facility] CATH LAB;  Service: Cardiovascular;  Laterality: N/A;   Family History  Problem Relation Age of Onset  . Colon cancer Mother    Social History  Substance Use Topics  . Smoking status: Never Smoker   . Smokeless tobacco: Never Used  . Alcohol Use: No    Review of Systems  Constitutional: Negative for fever.  Eyes: Negative for visual disturbance.  Respiratory: Positive for shortness of breath (with walking and when he stands).   Cardiovascular: Negative for leg swelling.  Gastrointestinal: Negative for vomiting, abdominal pain and diarrhea.  Musculoskeletal: Positive for gait problem. Negative for neck pain.  Neurological: Positive for weakness and headaches. Negative for  dizziness.  All other systems reviewed and are negative.     Allergies  Brilinta; Penicillins; and Sulfa antibiotics  Home Medications   Prior to Admission medications   Medication Sig Start Date End Date Taking? Authorizing Provider  amiodarone (PACERONE) 200 MG tablet Take 1 tablet (200 mg total) by mouth daily. 11/27/14   Sueanne Margarita, MD  aspirin 81 MG tablet Take 81 mg by mouth daily.    Historical Provider, MD  calcium carbonate (OS-CAL) 600 MG TABS tablet Take 600 mg by mouth daily with breakfast.    Historical Provider, MD  ferrous sulfate 325 (65 FE) MG tablet Take 1 tablet (325 mg total) by mouth 2 (two) times daily with a meal. 10/27/14   Florencia Reasons, MD  furosemide (LASIX) 20 MG tablet Take 1 tablet (20 mg total) by mouth every other day. 01/13/16   Sueanne Margarita, MD  GLYBURIDE PO Take 2 mg by mouth daily.    Historical Provider, MD  metoprolol tartrate (LOPRESSOR) 25 MG tablet Take 0.5 tablets (12.5 mg total) by mouth 2 (two) times daily. 12/26/14   Sueanne Margarita, MD  omeprazole (PRILOSEC) 20 MG capsule TAKE 1 CAPSULE (20 MG TOTAL) BY MOUTH DAILY. 12/01/15   Irene Shipper, MD  pioglitazone (ACTOS) 30 MG tablet Take 1 tablet (30 mg total) by mouth daily. 11/05/14   Dayna N Dunn, PA-C  ranitidine (ZANTAC) 75 MG tablet Take 150 mg by mouth every morning. Patient unsure of exact dosage    Historical Provider, MD  saxagliptin HCl (ONGLYZA) 5 MG TABS tablet Take 1 tablet (5 mg total) by mouth daily. 06/20/14   Tanda Rockers, MD  simvastatin (ZOCOR) 20 MG tablet Take 20 mg by mouth daily.    Historical Provider, MD  tamsulosin (FLOMAX) 0.4 MG CAPS capsule Take 1 capsule (0.4 mg total) by mouth daily after supper. 10/27/14   Florencia Reasons, MD   BP 128/66 mmHg  Pulse 78  Temp(Src) 98.8 F (37.1 C) (Oral)  Resp 20  SpO2 98% Physical Exam  Constitutional: He is oriented to person, place, and time. He appears well-developed and well-nourished.  HENT:  Head: Normocephalic.    Right Ear:  External ear normal.  Left Ear: External ear normal.  Nose: Nose normal.  Eyes: EOM are normal. Pupils are equal, round, and reactive to light. Right eye exhibits no discharge. Left eye exhibits no discharge.  Neck: Neck supple. No spinous process tenderness and no muscular tenderness present.  Cardiovascular: Normal rate, regular rhythm, normal heart sounds and intact distal pulses.   Pulmonary/Chest: Effort normal and breath sounds normal.  Abdominal: Soft. He exhibits no distension. There is no tenderness.  Musculoskeletal: He exhibits edema (mild LE edema).  Neurological: He is alert and oriented to  person, place, and time.  CN 2-12 grossly intact. Diffusely weak. Bilateral upper extremities are weak but patient states this is pain related to his upper arms. Bilateral legs appear similar and unable to hold off bed more than a few seconds  Skin: Skin is warm and dry.  Nursing note and vitals reviewed.   ED Course  Procedures (including critical care time) Labs Review Labs Reviewed  BASIC METABOLIC PANEL - Abnormal; Notable for the following:    CO2 20 (*)    Glucose, Bld 163 (*)    BUN 41 (*)    Creatinine, Ser 1.87 (*)    Calcium 8.4 (*)    GFR calc non Af Amer 32 (*)    GFR calc Af Amer 37 (*)    All other components within normal limits  BRAIN NATRIURETIC PEPTIDE - Abnormal; Notable for the following:    B Natriuretic Peptide 186.8 (*)    All other components within normal limits  CBC WITH DIFFERENTIAL/PLATELET - Abnormal; Notable for the following:    RBC 3.30 (*)    Hemoglobin 10.2 (*)    HCT 31.5 (*)    Neutro Abs 7.8 (*)    All other components within normal limits  TROPONIN I  TROPONIN I  HEMOGLOBIN A1C  LIPID PANEL  CBC  CREATININE, SERUM    Imaging Review Dg Chest 2 View  01/14/2016  CLINICAL DATA:  Weakness EXAM: CHEST  2 VIEW COMPARISON:  10/22/2014 chest radiograph. FINDINGS: Stable cardiomediastinal silhouette with mild cardiomegaly. No pneumothorax. No  pleural effusion. Mild pulmonary edema. Mild bibasilar atelectasis. IMPRESSION: 1. Mild congestive heart failure. 2. Mild bibasilar atelectasis. Electronically Signed   By: Ilona Sorrel M.D.   On: 01/14/2016 18:06   Dg Pelvis 1-2 Views  01/14/2016  CLINICAL DATA:  Status post fall, with bilateral leg weakness. Initial encounter. EXAM: PELVIS - 1-2 VIEW COMPARISON:  CT of the abdomen and pelvis from 10/22/2014 FINDINGS: There is no evidence of fracture or dislocation. Both femoral heads are seated normally within their respective acetabula. Mild degenerative change is noted at the lower lumbar spine. The sacroiliac joints are unremarkable in appearance. The visualized bowel gas pattern is grossly unremarkable in appearance. Scattered clips and phleboliths are noted within the pelvis. IMPRESSION: No evidence of fracture or dislocation. Electronically Signed   By: Garald Balding M.D.   On: 01/14/2016 23:12   Ct Head Wo Contrast  01/14/2016  CLINICAL DATA:  PT FELL FLAT ON FACE THIS AFTERNOON LARGE HEMATOMAS LT SIDE OF CHEEK, TEMPORAL, PARIETAL, FRONTAL PT COLLARED NECK PAIN MOSTLY ON LEFT EXAM: CT HEAD WITHOUT CONTRAST CT MAXILLOFACIAL WITHOUT CONTRAST CT CERVICAL SPINE WITHOUT CONTRAST TECHNIQUE: Multidetector CT imaging of the head, cervical spine, and maxillofacial structures were performed using the standard protocol without intravenous contrast. Multiplanar CT image reconstructions of the cervical spine and maxillofacial structures were also generated. COMPARISON:  Head CT, 05/26/2008 FINDINGS: CT HEAD FINDINGS Left frontal scalp hematoma.  No skull fracture. The ventricles are normal in size, for this patient's age, and normal in configuration. There are no parenchymal masses or mass effect. There is no evidence of an infarct. There are no extra-axial masses or abnormal fluid collections. There is no intracranial hemorrhage. CT MAXILLOFACIAL FINDINGS There are nondisplaced fractures at the base of the nasal  bridge. These may not be acute, but this appears be a change from the prior CT. No other evidence of an acute fracture. There is an old left medial orbital wall fracture. Right maxillary  sinuses chronically opacified as it was on the prior study. Mild mucosal thickening noted along several of the right ethmoid air cells. The mastoid air cells and middle ear cavities are clear. Status post bilateral cataract surgery. Globes and orbits are otherwise unremarkable. No soft tissue masses or adenopathy. No hematoma. There are degenerative changes of both temporomandibular joints. CT CERVICAL SPINE FINDINGS No fracture.  No spondylolisthesis. Mild loss of disc height at C5-C6 and C6-C7. There are small endplate osteophytes at these levels. There is facet degenerative change most evident on the left at C2-C3 and C3-C4 and on the right at C4-C5. Soft tissues are unremarkable other than 12 mm left thyroid nodule and carotid vascular calcifications. Lung apices are clear. IMPRESSION: HEAD CT: No acute intracranial abnormalities. No skull fracture. Left frontal scalp hematoma. MAXILLOFACIAL CT: Nondisplaced fractures at the base of the nasal bridge. These may not be acute but should be considered acute if they correlate with the site of injury/tenderness. No other evidence of a fracture. CERVICAL CT:  No fracture or acute finding. Electronically Signed   By: Lajean Manes M.D.   On: 01/14/2016 19:01   Ct Cervical Spine Wo Contrast  01/14/2016  CLINICAL DATA:  PT FELL FLAT ON FACE THIS AFTERNOON LARGE HEMATOMAS LT SIDE OF CHEEK, TEMPORAL, PARIETAL, FRONTAL PT COLLARED NECK PAIN MOSTLY ON LEFT EXAM: CT HEAD WITHOUT CONTRAST CT MAXILLOFACIAL WITHOUT CONTRAST CT CERVICAL SPINE WITHOUT CONTRAST TECHNIQUE: Multidetector CT imaging of the head, cervical spine, and maxillofacial structures were performed using the standard protocol without intravenous contrast. Multiplanar CT image reconstructions of the cervical spine and  maxillofacial structures were also generated. COMPARISON:  Head CT, 05/26/2008 FINDINGS: CT HEAD FINDINGS Left frontal scalp hematoma.  No skull fracture. The ventricles are normal in size, for this patient's age, and normal in configuration. There are no parenchymal masses or mass effect. There is no evidence of an infarct. There are no extra-axial masses or abnormal fluid collections. There is no intracranial hemorrhage. CT MAXILLOFACIAL FINDINGS There are nondisplaced fractures at the base of the nasal bridge. These may not be acute, but this appears be a change from the prior CT. No other evidence of an acute fracture. There is an old left medial orbital wall fracture. Right maxillary sinuses chronically opacified as it was on the prior study. Mild mucosal thickening noted along several of the right ethmoid air cells. The mastoid air cells and middle ear cavities are clear. Status post bilateral cataract surgery. Globes and orbits are otherwise unremarkable. No soft tissue masses or adenopathy. No hematoma. There are degenerative changes of both temporomandibular joints. CT CERVICAL SPINE FINDINGS No fracture.  No spondylolisthesis. Mild loss of disc height at C5-C6 and C6-C7. There are small endplate osteophytes at these levels. There is facet degenerative change most evident on the left at C2-C3 and C3-C4 and on the right at C4-C5. Soft tissues are unremarkable other than 12 mm left thyroid nodule and carotid vascular calcifications. Lung apices are clear. IMPRESSION: HEAD CT: No acute intracranial abnormalities. No skull fracture. Left frontal scalp hematoma. MAXILLOFACIAL CT: Nondisplaced fractures at the base of the nasal bridge. These may not be acute but should be considered acute if they correlate with the site of injury/tenderness. No other evidence of a fracture. CERVICAL CT:  No fracture or acute finding. Electronically Signed   By: Lajean Manes M.D.   On: 01/14/2016 19:01   Ct Maxillofacial Wo  Cm  01/14/2016  CLINICAL DATA:  PT FELL FLAT ON  FACE THIS AFTERNOON LARGE HEMATOMAS LT SIDE OF CHEEK, TEMPORAL, PARIETAL, FRONTAL PT COLLARED NECK PAIN MOSTLY ON LEFT EXAM: CT HEAD WITHOUT CONTRAST CT MAXILLOFACIAL WITHOUT CONTRAST CT CERVICAL SPINE WITHOUT CONTRAST TECHNIQUE: Multidetector CT imaging of the head, cervical spine, and maxillofacial structures were performed using the standard protocol without intravenous contrast. Multiplanar CT image reconstructions of the cervical spine and maxillofacial structures were also generated. COMPARISON:  Head CT, 05/26/2008 FINDINGS: CT HEAD FINDINGS Left frontal scalp hematoma.  No skull fracture. The ventricles are normal in size, for this patient's age, and normal in configuration. There are no parenchymal masses or mass effect. There is no evidence of an infarct. There are no extra-axial masses or abnormal fluid collections. There is no intracranial hemorrhage. CT MAXILLOFACIAL FINDINGS There are nondisplaced fractures at the base of the nasal bridge. These may not be acute, but this appears be a change from the prior CT. No other evidence of an acute fracture. There is an old left medial orbital wall fracture. Right maxillary sinuses chronically opacified as it was on the prior study. Mild mucosal thickening noted along several of the right ethmoid air cells. The mastoid air cells and middle ear cavities are clear. Status post bilateral cataract surgery. Globes and orbits are otherwise unremarkable. No soft tissue masses or adenopathy. No hematoma. There are degenerative changes of both temporomandibular joints. CT CERVICAL SPINE FINDINGS No fracture.  No spondylolisthesis. Mild loss of disc height at C5-C6 and C6-C7. There are small endplate osteophytes at these levels. There is facet degenerative change most evident on the left at C2-C3 and C3-C4 and on the right at C4-C5. Soft tissues are unremarkable other than 12 mm left thyroid nodule and carotid vascular  calcifications. Lung apices are clear. IMPRESSION: HEAD CT: No acute intracranial abnormalities. No skull fracture. Left frontal scalp hematoma. MAXILLOFACIAL CT: Nondisplaced fractures at the base of the nasal bridge. These may not be acute but should be considered acute if they correlate with the site of injury/tenderness. No other evidence of a fracture. CERVICAL CT:  No fracture or acute finding. Electronically Signed   By: Lajean Manes M.D.   On: 01/14/2016 19:01   I have personally reviewed and evaluated these images and lab results as part of my medical decision-making.   EKG Interpretation   Date/Time:  Thursday January 14 2016 18:14:26 EDT Ventricular Rate:  72 PR Interval:  173 QRS Duration: 133 QT Interval:  428 QTC Calculation: 468 R Axis:   -62 Text Interpretation:  Age not entered, assumed to be  82 years old for  purpose of ECG interpretation Sinus rhythm Left bundle branch block afib  not present compared to Jan 2016 Confirmed by Regenia Skeeter  MD, [place] [phone])  on 01/14/2016 7:05:01 PM      MDM   Final diagnoses:  Weakness    Patient with progressive weakness and difficulty ambulating. Family is concerned he cannot take care of himself and lives at home alone. They cannot take care of him. Patient is able to ambulate here but still feels weak and off balance. Given safety concerns with progressive weakness, plan to observe patient overnight with the hospitalist. No acute findings on workup currently. His PCP was most concerned about a brain or cervical spine lesion causing increasing weakness. Due to this MRI has been ordered.    Sherwood Gambler, MD 01/14/16 [phone]

## 2016-01-14 NOTE — ED Notes (Signed)
Called xray, patient is currently in MRI.

## 2016-01-14 NOTE — ED Notes (Signed)
Patient's Sister requests that when talking about patient's plan of care to not speaking of nursing home. If patient is to be sent to a nursing facility, utilize the word rehab instead. Family made aware that this RN would pass on information.

## 2016-01-14 NOTE — ED Notes (Signed)
Sister Joycelyn Schmid would like to be called with room number. [phone]

## 2016-01-14 NOTE — Telephone Encounter (Signed)
Is he having any LE edema

## 2016-01-14 NOTE — ED Notes (Signed)
Attempted to call report. Receiving nurse to call back in 20 minutes.

## 2016-01-14 NOTE — ED Notes (Signed)
Patient going to xray

## 2016-01-14 NOTE — Progress Notes (Signed)
Attempted to get report from ED RN. RN in another patient room.

## 2016-01-14 NOTE — ED Notes (Signed)
Discussed plan to ambulate in hallway. Sister at the bedside, "yal know he can't walk", asked if patient uses walker or cane. "he can't use his arms or hands. Patient states "i can use a cane on the right hand".

## 2016-01-15 ENCOUNTER — Observation Stay (HOSPITAL_COMMUNITY): Payer: Medicare Other

## 2016-01-15 DIAGNOSIS — I10 Essential (primary) hypertension: Secondary | ICD-10-CM | POA: Diagnosis not present

## 2016-01-15 DIAGNOSIS — M25512 Pain in left shoulder: Secondary | ICD-10-CM | POA: Diagnosis not present

## 2016-01-15 DIAGNOSIS — K922 Gastrointestinal hemorrhage, unspecified: Secondary | ICD-10-CM | POA: Diagnosis not present

## 2016-01-15 DIAGNOSIS — S199XXA Unspecified injury of neck, initial encounter: Secondary | ICD-10-CM | POA: Diagnosis not present

## 2016-01-15 DIAGNOSIS — I2510 Atherosclerotic heart disease of native coronary artery without angina pectoris: Secondary | ICD-10-CM

## 2016-01-15 DIAGNOSIS — R55 Syncope and collapse: Secondary | ICD-10-CM | POA: Diagnosis not present

## 2016-01-15 DIAGNOSIS — S40011A Contusion of right shoulder, initial encounter: Secondary | ICD-10-CM | POA: Diagnosis not present

## 2016-01-15 DIAGNOSIS — S0990XA Unspecified injury of head, initial encounter: Secondary | ICD-10-CM | POA: Diagnosis not present

## 2016-01-15 DIAGNOSIS — N183 Chronic kidney disease, stage 3 (moderate): Secondary | ICD-10-CM

## 2016-01-15 DIAGNOSIS — S5011XA Contusion of right forearm, initial encounter: Secondary | ICD-10-CM | POA: Diagnosis not present

## 2016-01-15 DIAGNOSIS — I480 Paroxysmal atrial fibrillation: Secondary | ICD-10-CM

## 2016-01-15 DIAGNOSIS — I493 Ventricular premature depolarization: Secondary | ICD-10-CM

## 2016-01-15 LAB — CBC
HCT: 32.5 % — ABNORMAL LOW (ref 39.0–52.0)
Hemoglobin: 10.7 g/dL — ABNORMAL LOW (ref 13.0–17.0)
MCH: 31.5 pg (ref 26.0–34.0)
MCHC: 32.9 g/dL (ref 30.0–36.0)
MCV: 95.6 fL (ref 78.0–100.0)
Platelets: 146 10*3/uL — ABNORMAL LOW (ref 150–400)
RBC: 3.4 MIL/uL — ABNORMAL LOW (ref 4.22–5.81)
RDW: 14.6 % (ref 11.5–15.5)
WBC: 7.7 10*3/uL (ref 4.0–10.5)

## 2016-01-15 LAB — LIPID PANEL
Cholesterol: 122 mg/dL (ref 0–200)
HDL: 40 mg/dL — ABNORMAL LOW (ref >40)
LDL Cholesterol: 62 mg/dL (ref 0–99)
Total CHOL/HDL Ratio: 3.1 RATIO
Triglycerides: 99 mg/dL (ref <150)
VLDL: 20 mg/dL (ref 0–40)

## 2016-01-15 LAB — GLUCOSE, CAPILLARY
Glucose-Capillary: 114 mg/dL — ABNORMAL HIGH (ref 65–99)
Glucose-Capillary: 125 mg/dL — ABNORMAL HIGH (ref 65–99)
Glucose-Capillary: 171 mg/dL — ABNORMAL HIGH (ref 65–99)
Glucose-Capillary: 113 mg/dL — ABNORMAL HIGH (ref 65–99)
Glucose-Capillary: 88 mg/dL (ref 65–99)

## 2016-01-15 LAB — CREATININE, SERUM
Creatinine, Ser: 1.65 mg/dL — ABNORMAL HIGH (ref 0.61–1.24)
GFR calc Af Amer: 44 mL/min — ABNORMAL LOW (ref >60)
GFR calc non Af Amer: 38 mL/min — ABNORMAL LOW (ref >60)

## 2016-01-15 LAB — TROPONIN I: Troponin I: 0.03 ng/mL (ref <0.031)

## 2016-01-15 MED ORDER — GLIMEPIRIDE 2 MG PO TABS
2.0000 mg | ORAL_TABLET | Freq: Two times a day (BID) | ORAL | Status: DC
  Administered 2016-01-15 – 2016-01-17 (×4): 2 mg via ORAL
  Filled 2016-01-15 (×6): qty 1

## 2016-01-15 NOTE — Progress Notes (Signed)
[facility]                                                                              Patient Demographics  [name] [name], is a 82 y.o. male, DOB - 1933/09/11, [id number]  Admit date - 01/14/2016   Admitting Physician [name], MD  Outpatient Primary MD for the patient is [name], MD  LOS -   days    Chief Complaint  Patient presents with  . multiple falls        Brief HPI   [name] [name] is a 82 y.o. male with history of diastolic CHF, CAD, diabetes mellitus, chronic disease chronic anemia and ulcerative colitis and paroxysmal atrial fibrillation who was referred to the ER by patient's primary care physician as patient has been having recurrent falls and has bruised his face after his last fall. Patient also has sustained an injury to his right shoulder which he has followed up with his primary orthopedic surgeon last week. Patient was referred to the physical therapy but patient had a fall during the process. 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 - 2-D echo pending, follow-up PT evaluation , will likely need skilled nursing facility  Active Problems:   Benign essential HTN - Currently stable    CAD in native artery- total LAD, s/p RI DES 06/19/14 - Currently no chest pain, continue aspirin, beta blocker, statins  diastolic CHF, chronic - 2-D echo 50-55% with grade 2 diastolic dysfunction in 12/4915 - Follow 2-D echo, I/O's and daily weights, continue home dose of Lasix  History of GI bleed - Follow CBC, currently at baseline  Chronic diabetes mellitus type 2, with renal complications - Place on sliding scale insulin while inpatient  Chronic kidney  disease stage III - Currently creatinine function at baseline  Paroxysmal Atrial fibrillation - Currently normal sinus rhythm, continue amiodarone and beta blocker - CHADS vasc 5, not on" secondary to GI bleed and falls  Bilateral arms pain, right shoulder pain -  will obtain x-ray of the right shoulder to rule out any fracture  Code Status: DO NOT RESUSCITATE  Family Communication: Discussed in detail with the patient, all imaging results, lab results explained to the patient   Disposition Plan: Unable to take care of himself at home with falls, will need a skilled nursing facility  Time Spent in minutes   25 minutes  Procedures  MRI brain and C-spine  Consults   None  DVT Prophylaxis  Lovenox   Medications  Scheduled Meds: . amiodarone  200 mg Oral Daily  . aspirin EC  81 mg Oral Daily  . calcium carbonate  1,250 mg Oral Q breakfast  . enoxaparin (LOVENOX) injection  40 mg Subcutaneous Daily  . famotidine  20 mg Oral BID  . ferrous sulfate  325 mg Oral BID WC  . furosemide  20 mg  Oral QODAY  . glimepiride  2 mg Oral BID WC  . insulin aspart  0-9 Units Subcutaneous TID WC  . linagliptin  5 mg Oral Daily  . pantoprazole  40 mg Oral Daily  . pioglitazone  30 mg Oral Q breakfast  . simvastatin  20 mg Oral Daily  . tamsulosin  0.4 mg Oral QPC supper   Continuous Infusions:  PRN Meds:.   Antibiotics   Anti-infectives    None        Subjective:   [name] [name] was seen and examined today.  Patient denies dizziness, chest pain, shortness of breath, abdominal pain, N/V/D/C, new weakness, numbess, tingling. No acute events overnight.    Objective:   Filed Vitals:   01/14/16 2130 01/14/16 2200 01/15/16 0145 01/15/16 0634  BP: 109/55 123/99 129/62 131/48  Pulse: 80 74 77 73  Temp:   98.1 F (36.7 C) 97.8 F (36.6 C)  TempSrc:   Oral Oral  Resp: 21 15 17 18   Height:   5' 10"  (1.778 m)   Weight:   107.7 kg (237 lb 7 oz)   SpO2: 97% 95% 98% 97%     Intake/Output Summary (Last 24 hours) at 01/15/16 1249 Last data filed at 01/15/16 0900  Gross per 24 hour  Intake    236 ml  Output    600 ml  Net   -364 ml     Wt Readings from Last 3 Encounters:  01/15/16 107.7 kg (237 lb 7 oz)  01/12/16 112.492 kg (248 lb)  07/08/15 115.722 kg (255 lb 1.9 oz)     Exam  General: Alert and oriented x 3, NAD  HEENT:  PERRLA, EOMI, Anicteric Sclera, mucous membranes moist. Bruising on the face   Neck: Supple, no JVD, no masses  CVS: S1 S2 auscultated, no rubs, murmurs or gallops. Regular rate and rhythm.  Respiratory: Clear to auscultation bilaterally, no wheezing, rales or rhonchi  Abdomen: Soft, nontender, nondistended, + bowel sounds, +Ostomy   Ext: no cyanosis clubbing or edema, Right shoulder pain, left forearm pain   Neuro: AAOx3, Cr N's II- XII. Strength 5/5 upper and lower extremities bilaterally  Skin: No rashes  Psych: Normal affect and demeanor, alert and oriented x3    Data Reviewed:  I have personally reviewed following labs and imaging studies  Micro Results No results found for this or any previous visit (from the past 240 hour(s)).  Radiology Reports Dg Chest 2 View  01/14/2016  CLINICAL DATA:  Weakness EXAM: CHEST  2 VIEW COMPARISON:  10/22/2014 chest radiograph. FINDINGS: Stable cardiomediastinal silhouette with mild cardiomegaly. No pneumothorax. No pleural effusion. Mild pulmonary edema. Mild bibasilar atelectasis. IMPRESSION: 1. Mild congestive heart failure. 2. Mild bibasilar atelectasis. Electronically Signed   By: Ilona Sorrel M.D.   On: 01/14/2016 18:06   Dg Pelvis 1-2 Views  01/14/2016  CLINICAL DATA:  Status post fall, with bilateral leg weakness. Initial encounter. EXAM: PELVIS - 1-2 VIEW COMPARISON:  CT of the abdomen and pelvis from 10/22/2014 FINDINGS: There is no evidence of fracture or dislocation. Both femoral heads are seated normally within their respective acetabula. Mild degenerative change  is noted at the lower lumbar spine. The sacroiliac joints are unremarkable in appearance. The visualized bowel gas pattern is grossly unremarkable in appearance. Scattered clips and phleboliths are noted within the pelvis. IMPRESSION: No evidence of fracture or dislocation. Electronically Signed   By: Garald Balding M.D.   On: 01/14/2016 23:12   Ct Head Wo  Contrast  01/14/2016  CLINICAL DATA:  PT FELL FLAT ON FACE THIS AFTERNOON LARGE HEMATOMAS LT SIDE OF CHEEK, TEMPORAL, PARIETAL, FRONTAL PT COLLARED NECK PAIN MOSTLY ON LEFT EXAM: CT HEAD WITHOUT CONTRAST CT MAXILLOFACIAL WITHOUT CONTRAST CT CERVICAL SPINE WITHOUT CONTRAST TECHNIQUE: Multidetector CT imaging of the head, cervical spine, and maxillofacial structures were performed using the standard protocol without intravenous contrast. Multiplanar CT image reconstructions of the cervical spine and maxillofacial structures were also generated. COMPARISON:  Head CT, 05/26/2008 FINDINGS: CT HEAD FINDINGS Left frontal scalp hematoma.  No skull fracture. The ventricles are normal in size, for this patient's age, and normal in configuration. There are no parenchymal masses or mass effect. There is no evidence of an infarct. There are no extra-axial masses or abnormal fluid collections. There is no intracranial hemorrhage. CT MAXILLOFACIAL FINDINGS There are nondisplaced fractures at the base of the nasal bridge. These may not be acute, but this appears be a change from the prior CT. No other evidence of an acute fracture. There is an old left medial orbital wall fracture. Right maxillary sinuses chronically opacified as it was on the prior study. Mild mucosal thickening noted along several of the right ethmoid air cells. The mastoid air cells and middle ear cavities are clear. Status post bilateral cataract surgery. Globes and orbits are otherwise unremarkable. No soft tissue masses or adenopathy. No hematoma. There are degenerative changes of both temporomandibular  joints. CT CERVICAL SPINE FINDINGS No fracture.  No spondylolisthesis. Mild loss of disc height at C5-C6 and C6-C7. There are small endplate osteophytes at these levels. There is facet degenerative change most evident on the left at C2-C3 and C3-C4 and on the right at C4-C5. Soft tissues are unremarkable other than 12 mm left thyroid nodule and carotid vascular calcifications. Lung apices are clear. IMPRESSION: HEAD CT: No acute intracranial abnormalities. No skull fracture. Left frontal scalp hematoma. MAXILLOFACIAL CT: Nondisplaced fractures at the base of the nasal bridge. These may not be acute but should be considered acute if they correlate with the site of injury/tenderness. No other evidence of a fracture. CERVICAL CT:  No fracture or acute finding. Electronically Signed   By: Lajean Manes M.D.   On: 01/14/2016 19:01   Ct Cervical Spine Wo Contrast  01/14/2016  CLINICAL DATA:  PT FELL FLAT ON FACE THIS AFTERNOON LARGE HEMATOMAS LT SIDE OF CHEEK, TEMPORAL, PARIETAL, FRONTAL PT COLLARED NECK PAIN MOSTLY ON LEFT EXAM: CT HEAD WITHOUT CONTRAST CT MAXILLOFACIAL WITHOUT CONTRAST CT CERVICAL SPINE WITHOUT CONTRAST TECHNIQUE: Multidetector CT imaging of the head, cervical spine, and maxillofacial structures were performed using the standard protocol without intravenous contrast. Multiplanar CT image reconstructions of the cervical spine and maxillofacial structures were also generated. COMPARISON:  Head CT, 05/26/2008 FINDINGS: CT HEAD FINDINGS Left frontal scalp hematoma.  No skull fracture. The ventricles are normal in size, for this patient's age, and normal in configuration. There are no parenchymal masses or mass effect. There is no evidence of an infarct. There are no extra-axial masses or abnormal fluid collections. There is no intracranial hemorrhage. CT MAXILLOFACIAL FINDINGS There are nondisplaced fractures at the base of the nasal bridge. These may not be acute, but this appears be a change from the  prior CT. No other evidence of an acute fracture. There is an old left medial orbital wall fracture. Right maxillary sinuses chronically opacified as it was on the prior study. Mild mucosal thickening noted along several of the right ethmoid air cells. The mastoid air  cells and middle ear cavities are clear. Status post bilateral cataract surgery. Globes and orbits are otherwise unremarkable. No soft tissue masses or adenopathy. No hematoma. There are degenerative changes of both temporomandibular joints. CT CERVICAL SPINE FINDINGS No fracture.  No spondylolisthesis. Mild loss of disc height at C5-C6 and C6-C7. There are small endplate osteophytes at these levels. There is facet degenerative change most evident on the left at C2-C3 and C3-C4 and on the right at C4-C5. Soft tissues are unremarkable other than 12 mm left thyroid nodule and carotid vascular calcifications. Lung apices are clear. IMPRESSION: HEAD CT: No acute intracranial abnormalities. No skull fracture. Left frontal scalp hematoma. MAXILLOFACIAL CT: Nondisplaced fractures at the base of the nasal bridge. These may not be acute but should be considered acute if they correlate with the site of injury/tenderness. No other evidence of a fracture. CERVICAL CT:  No fracture or acute finding. Electronically Signed   By: Lajean Manes M.D.   On: 01/14/2016 19:01   Mr Brain Wo Contrast  01/15/2016  CLINICAL DATA:  Initial evaluation for acute near syncope, fall. EXAM: MRI HEAD WITHOUT CONTRAST TECHNIQUE: Multiplanar, multiecho pulse sequences of the brain and surrounding structures were obtained without intravenous contrast. COMPARISON:  Prior CT from 01/14/2016. FINDINGS: Age-related cerebral atrophy with mild chronic small vessel ischemic disease present. Small remote left cerebellar infarct noted. No abnormal foci of restricted diffusion to suggest acute infarct. Major intracranial vascular flow voids are maintained. Gray-white matter differentiation  preserved. No acute or chronic intracranial hemorrhage. No mass lesion, midline shift, or mass effect. No hydrocephalus. No extra-axial fluid collection. Major dural sinuses are grossly patent. Craniocervical junction within normal limits. Incidental note made of an empty sella. No acute abnormality about the orbits. Increase CSF density along the optic nerve sheaths bilaterally. Remote posttraumatic defect at the left lamina papyracea. Sequela prior bilateral lens extraction noted. Chronic right maxillary sinus disease noted. Mild mucosal thickening within the left sphenoid sinus. Paranasal sinuses are otherwise clear. No significant mastoid effusion. Inner ear structures normal. Fatty atrophy of the parotid glands noted. Bone marrow signal intensity within normal limits. Left frontal scalp contusion noted. IMPRESSION: 1. No acute intracranial process. 2. Left frontal scalp contusion. 3. Small remote left cerebellar infarct. 4. Empty sella with increased CSF fluid along the optic nerve sheaths. Findings are nonspecific, and often incidental, but can be seen in the setting of elevated intracranial pressures. 5. Age-related cerebral atrophy with mild chronic small vessel ischemic disease. 6. Chronic right maxillary sinus disease. Electronically Signed   By: Jeannine Boga M.D.   On: 01/15/2016 00:29   Mr Cervical Spine Wo Contrast  01/15/2016  CLINICAL DATA:  Initial evaluation for acute trauma, fall. EXAM: MRI CERVICAL SPINE WITHOUT CONTRAST TECHNIQUE: Multiplanar, multisequence MR imaging of the cervical spine was performed. No intravenous contrast was administered. COMPARISON:  Prior CT from earlier the same day. FINDINGS: Visualized portions of the brain and posterior fossa are unremarkable. Craniocervical junction widely patent. Vertebral bodies are normally aligned with preservation of the normal cervical lordosis paravertebral body heights are well preserved. No fracture or malalignment. Degenerative  thickening of the tectorial membrane noted. Degenerative geode at the anterior aspect of the dens. Signal intensity within the vertebral body bone marrow is normal. No focal osseous lesions. No marrow edema. Signal intensity within the cervical spinal cord is normal. Paraspinous soft tissues within normal limits. No prevertebral edema. C2-C3: Mild bilateral uncovertebral spurring and facet arthrosis. No significant canal or foraminal narrowing. C3-C4:  Mild bilateral uncovertebral hypertrophy. Left-sided facet arthrosis. Resultant mild to moderate left foraminal narrowing. No significant right foraminal or central canal stenosis. C4-C5: Mild bilateral uncovertebral hypertrophy and facet arthrosis. Resultant moderate right with mild left foraminal narrowing. No canal stenosis. C5-C6: Bilateral uncovertebral hypertrophy and facet arthrosis. Mild posterior disc bulge. Resultant mild to moderate bilateral foraminal stenosis. Bulging disc mildly flattens the ventral thecal sac without significant canal stenosis. C6-C7: Mild bilateral uncovertebral spurring, left greater than right. Broad-based posterior disc bulge mildly flattens the ventral thecal sac without significant stenosis. Resultant severe left with moderate right foraminal stenosis, left worse than right. C7-T1:  Mild bilateral uncovertebral hypertrophy without stenosis. Visualized upper thoracic spine within normal limits. No significant stenosis. IMPRESSION: 1. No acute traumatic injury within the cervical spine. 2. Relatively mild for age multilevel degenerative spondylolysis and facet arthrosis. Resultant multilevel foraminal narrowing as above, most severe at the C6-7 level were there is severe left and moderate right foraminal narrowing. No significant canal stenosis. Please see above report for a full description of these findings. Electronically Signed   By: Jeannine Boga M.D.   On: 01/15/2016 00:58   Ct Maxillofacial Wo Cm  01/14/2016   CLINICAL DATA:  PT FELL FLAT ON FACE THIS AFTERNOON LARGE HEMATOMAS LT SIDE OF CHEEK, TEMPORAL, PARIETAL, FRONTAL PT COLLARED NECK PAIN MOSTLY ON LEFT EXAM: CT HEAD WITHOUT CONTRAST CT MAXILLOFACIAL WITHOUT CONTRAST CT CERVICAL SPINE WITHOUT CONTRAST TECHNIQUE: Multidetector CT imaging of the head, cervical spine, and maxillofacial structures were performed using the standard protocol without intravenous contrast. Multiplanar CT image reconstructions of the cervical spine and maxillofacial structures were also generated. COMPARISON:  Head CT, 05/26/2008 FINDINGS: CT HEAD FINDINGS Left frontal scalp hematoma.  No skull fracture. The ventricles are normal in size, for this patient's age, and normal in configuration. There are no parenchymal masses or mass effect. There is no evidence of an infarct. There are no extra-axial masses or abnormal fluid collections. There is no intracranial hemorrhage. CT MAXILLOFACIAL FINDINGS There are nondisplaced fractures at the base of the nasal bridge. These may not be acute, but this appears be a change from the prior CT. No other evidence of an acute fracture. There is an old left medial orbital wall fracture. Right maxillary sinuses chronically opacified as it was on the prior study. Mild mucosal thickening noted along several of the right ethmoid air cells. The mastoid air cells and middle ear cavities are clear. Status post bilateral cataract surgery. Globes and orbits are otherwise unremarkable. No soft tissue masses or adenopathy. No hematoma. There are degenerative changes of both temporomandibular joints. CT CERVICAL SPINE FINDINGS No fracture.  No spondylolisthesis. Mild loss of disc height at C5-C6 and C6-C7. There are small endplate osteophytes at these levels. There is facet degenerative change most evident on the left at C2-C3 and C3-C4 and on the right at C4-C5. Soft tissues are unremarkable other than 12 mm left thyroid nodule and carotid vascular calcifications. Lung  apices are clear. IMPRESSION: HEAD CT: No acute intracranial abnormalities. No skull fracture. Left frontal scalp hematoma. MAXILLOFACIAL CT: Nondisplaced fractures at the base of the nasal bridge. These may not be acute but should be considered acute if they correlate with the site of injury/tenderness. No other evidence of a fracture. CERVICAL CT:  No fracture or acute finding. Electronically Signed   By: Lajean Manes M.D.   On: 01/14/2016 19:01    CBC  Recent Labs Lab 01/14/16 1742 01/15/16 0032  WBC 9.5 7.7  HGB 10.2* 10.7*  HCT 31.5* 32.5*  PLT 219 146*  MCV 95.5 95.6  MCH 30.9 31.5  MCHC 32.4 32.9  RDW 14.5 14.6  LYMPHSABS 1.0  --   MONOABS 0.7  --   EOSABS 0.0  --   BASOSABS 0.0  --     Chemistries   Recent Labs Lab 01/12/16 0853 01/14/16 1742 01/15/16 0032  NA 135 137  --   K 5.1 4.5  --   CL 104 107  --   CO2 21 20*  --   GLUCOSE 148* 163*  --   BUN 47* 41*  --   CREATININE 2.04* 1.87* 1.65*  CALCIUM 8.6 8.4*  --   AST 16  --   --   ALT 14  --   --   ALKPHOS 113  --   --   BILITOT 0.7  --   --    ------------------------------------------------------------------------------------------------------------------ estimated creatinine clearance is 43.9 mL/min (by C-G formula based on Cr of 1.65). ------------------------------------------------------------------------------------------------------------------ No results for input(s): HGBA1C in the last 72 hours. ------------------------------------------------------------------------------------------------------------------  Recent Labs  01/15/16 0525  CHOL 122  HDL 40*  LDLCALC 62  TRIG 99  CHOLHDL 3.1   ------------------------------------------------------------------------------------------------------------------ No results for input(s): TSH, T4TOTAL, T3FREE, THYROIDAB in the last 72 hours.  Invalid input(s):  FREET3 ------------------------------------------------------------------------------------------------------------------ No results for input(s): VITAMINB12, FOLATE, FERRITIN, TIBC, IRON, RETICCTPCT in the last 72 hours.  Coagulation profile No results for input(s): INR, PROTIME in the last 168 hours.  No results for input(s): DDIMER in the last 72 hours.  Cardiac Enzymes  Recent Labs Lab 01/14/16 1742 01/15/16 0032  TROPONINI 0.03 0.03   ------------------------------------------------------------------------------------------------------------------ Invalid input(s): POCBNP   Recent Labs  01/15/16 0136 01/15/16 0757 01/15/16 1145  GLUCAP 114* 125* 171*     RAI,RIPUDEEP M.D. [facility] 01/15/2016, 12:49 PM  Pager: [phone] Between 7am to 7pm - call Pager - [phone]  After 7pm go to [email or web address] - password [email or web address]  Call night coverage person covering after 7pm

## 2016-01-15 NOTE — Care Management Note (Signed)
Case Management Note  Patient Details  Name: [name] [name] MRN: [id number] Date of Birth: [birth year]  Subjective/Objective:                 Spoke with patient at the bedside. Patient fell at home. PT would rec SNF however pt does not qualify for SNF unless paid out of pocket. Spoke with Jolene Provost RNCM for Obs cases to re-review chart to see if pt qualified for inpatient; he does not. Explained this to patient, he seemed in understanding and signed his MOON. Patient stated that he would like [facility] for [facility] at discharge. Patient's sister in room very argumentative with RN CM, and interrupted each time CM attempted to explain MOON and demanding that we change pt to inpatient status. CM explained that patient could be set up with [facility] RN [facility] PT OT and SW and they had the option of SNF if they were will pay out of pocket.    Action/Plan:  Anticipate DC to home w/ HH in next day.  Expected Discharge Date:                  Expected Discharge Plan:  [place] (Does not qualify for SNF)  In-House Referral:     Discharge planning Services  CM Consult  Post Acute Care Choice:  Home Health Choice offered to:  Patient  DME Arranged:    DME Agency:     HH Arranged:    [place] Agency:  [place]  Status of Service:  In process, will continue to follow  Medicare Important Message Given:    Date Medicare IM Given:    Medicare IM give by:    Date Additional Medicare IM Given:    Additional Medicare Important Message give by:     If discussed at [place] of Stay Meetings, dates discussed:    Additional Comments:  Carles Collet, RN 01/15/2016, 11:06 AM

## 2016-01-15 NOTE — ED Notes (Signed)
Patient returned from [facility].

## 2016-01-15 NOTE — Telephone Encounter (Signed)
Stop Lasix

## 2016-01-15 NOTE — Care Management Obs Status (Signed)
[place] NOTIFICATION   Patient Details  Name: [name] [name] MRN: [id number] Date of Birth: 1933-08-17   Medicare Observation Status Notification Given:  Yes    Carles Collet, RN 01/15/2016, 10:59 AM

## 2016-01-15 NOTE — Evaluation (Addendum)
Physical Therapy Evaluation Patient Details Name: [name] [name] MRN: [id number] DOB: 1932/11/09 Today's Date: 01/15/2016   History of Present Illness  Pt adm with recurrent falls. PMH - chf, afib, CAD, DM, colostomy  Clinical Impression  Pt admitted with above diagnosis and presents to PT with functional limitations due to deficits listed below (See PT problem list). Pt needs skilled PT to maximize independence and safety to allow discharge to ST-SNF if possible. Pt may not have qualifying stay for SNF. Expect pt will continue to have falls at home.     Follow Up Recommendations SNF (pt may not have qualifying stay)    Equipment Recommendations  Rolling walker with 5" wheels (if not available at home)    Recommendations for Other Services OT consult     Precautions / Restrictions Precautions Precautions: Fall Precaution Comments: colostomy Restrictions Weight Bearing Restrictions: No      Mobility  Bed Mobility               General bed mobility comments: Pt sitting EOB.  Transfers Overall transfer level: Needs assistance Equipment used: Rolling walker (2 wheeled) Transfers: Sit to/from Stand Sit to Stand: Min assist         General transfer comment: Assist for balance  Ambulation/Gait Ambulation/Gait assistance: Min assist Ambulation Distance (Feet): 75 Feet Assistive device: Rolling walker (2 wheeled) Gait Pattern/deviations: Step-through pattern;Decreased step length - right;Decreased step length - left;Staggering left;Staggering right;Wide base of support Gait velocity: decr Gait velocity interpretation: <1.8 ft/sec, indicative of risk for recurrent falls General Gait Details: Unsteady gait. Verbal cues to stay closer to walker.  Stairs            Wheelchair Mobility    Modified Rankin (Stroke Patients Only)       Balance Overall balance assessment: Needs assistance Sitting-balance support: No upper extremity supported;Feet  supported Sitting balance-[name]: Good     Standing balance support: No upper extremity supported Standing balance-[name]: Fair                               Pertinent Vitals/Pain Pain Assessment: Faces Faces Pain Scale: Hurts a little bit Pain Location: lt wrist and forearm Pain Descriptors / Indicators: Sore Pain Intervention(s): Limited activity within patient's tolerance;Other (comment) (Pt declined pain medication)    Home Living Family/patient expects to be discharged to:: Private residence Living Arrangements: Alone Available Help at Discharge: Family;Available PRN/intermittently Type of Home: House Home Access: Stairs to enter Entrance Stairs-Rails: None Entrance Stairs-Number of Steps: 2 Home Layout: One level Home Equipment: Cane - single point      Prior Function Level of Independence: Independent with assistive device(s)         Comments: Uses cane out of the house. Pt with falls.     Hand Dominance        Extremity/Trunk Assessment   Upper Extremity Assessment: RUE deficits/detail;LUE deficits/detail RUE Deficits / Details: Very limited shoulder ROM and poor strength     LUE Deficits / Details: Very limited shoulder ROM and poor strength   Lower Extremity Assessment: Generalized weakness         Communication   Communication: HOH  Cognition Arousal/Alertness: Awake/alert Behavior During Therapy: WFL for tasks assessed/performed Overall Cognitive Status: Within Functional Limits for tasks assessed                      General Comments  Exercises        Assessment/Plan    PT Assessment Patient needs continued PT services  PT Diagnosis Difficulty walking;Generalized weakness   PT Problem List Decreased strength;Decreased balance;Decreased mobility;Decreased knowledge of use of DME;Decreased safety awareness;Obesity  PT Treatment Interventions DME instruction;Gait training;Stair training;Functional  mobility training;Therapeutic activities;Therapeutic exercise;Balance training;Patient/family education   PT Goals (Current goals can be found in the Care Plan section) Acute Rehab PT Goals Patient Stated Goal: Return home PT Goal Formulation: With patient Time For Goal Achievement: 01/29/16 Potential to Achieve Goals: Good    Frequency Min 3X/week   Barriers to discharge Decreased caregiver support Lives alone and family unable to provide much support    Co-evaluation               End of Session Equipment Utilized During Treatment: Gait belt Activity Tolerance: Patient tolerated treatment well Patient left: in chair;with call bell/phone within reach;with chair alarm set;with family/visitor present Nurse Communication: Mobility status    Functional Assessment Tool Used: clinical judgement Functional Limitation: Mobility: Walking and moving around Mobility: Walking and Moving Around Current Status [phone]): At least 20 percent but less than 40 percent impaired, limited or restricted Mobility: Walking and Moving Around Goal Status [phone]): At least 1 percent but less than 20 percent impaired, limited or restricted    Time: 0929-0950 PT Time Calculation (min) (ACUTE ONLY): 21 min   Charges:   PT Evaluation $PT Eval Moderate Complexity: 1 Procedure     PT G Codes:   PT G-Codes **NOT FOR INPATIENT CLASS** Functional Assessment Tool Used: clinical judgement Functional Limitation: Mobility: Walking and moving around Mobility: Walking and Moving Around Current Status ([id number]): At least 20 percent but less than 40 percent impaired, limited or restricted Mobility: Walking and Moving Around Goal Status [phone]): At least 1 percent but less than 20 percent impaired, limited or restricted    [facility] 01/15/2016, 10:41 AM [place]

## 2016-01-15 NOTE — Progress Notes (Signed)
NURSING PROGRESS NOTE  Drakkar Medeiros BlakeMRN: [id number] Admission Data: 01/15/16 at [place] Attending Provider: [name], MD PCP: [name], MD Code status: DNR  Allergies:  Allergies  Allergen Reactions  . Brilinta [Ticagrelor]     Changed to Plavix due to SOB  . Penicillins Rash    Has patient had a PCN reaction causing immediate rash, facial/tongue/throat swelling, SOB or lightheadedness with hypotension:YES Has patient had a PCN reaction causing severe rash involving mucus membranes or skin necrosis: NO Has patient had a PCN reaction that required hospitalization No Has patient had a PCN reaction occurring within the last 10 years: NO If all of the above answers are "NO", then may proceed with Cephalosporin use.  . Sulfa Antibiotics Rash    Past Medical History:  Past Medical History  Diagnosis Date  . Diabetes ([place])   . Obesity   . Ulcerative colitis ([place])   . HTN (hypertension)   . Dyslipidemia   . GERD (gastroesophageal reflux disease)   . History of peptic ulcer disease   . Ulcerative colitis ([place])     a. s/p total colectomy remotely.  . Anemia   . Macular degeneration   . Kidney stone may 2009    Right hydronephrosis, S/P stone removal  . Small bowel obstruction, partial ([place]) 2009    Episode  . Shingles     episode  . Compression fracture of L1 lumbar vertebra ([name])   . Osteopenia   . Lower back pain   . OA (osteoarthritis) of hip   . CKD (chronic kidney disease), stage III   . Hx of acute renal failure 01/02/10-3/24-11    due to hypovolemic shock, gastroenteritis and dehydration,hospitalized . Did requre a few days of dialysis. Cr at discharge was 1.8  . PAF (paroxysmal atrial fibrillation) ([name])     not on long term anticoagulation due to history of heme positive stools and anemia  . Frequent PVCs   . CAD (coronary artery disease) 06/2014    a. 06/2014: abnl nuc. Cath: Totally occluded LAD with faint collaterals (not a candidate for CTO  PCI), 90% ramus s/p DES, 50-70% OM2.  . Complication of anesthesia     " during my kidney stone surgery my heart went out of rhythm  . History of blood transfusion   . Atrial flutter ([place])     a. s/p RFCA of counterclockwise cavo-tricuspid isthmus dependent atrial flutter 2009 by Dr. Rayann Heman.  . Chronic diastolic CHF (congestive heart failure) ([place])     a. 2D Echo 10/23/13: EF 50-55%, basal mid-inf HK, grade 2 DD, mild MR, mod dilated LA, no sig change from prior.  . Kidney disease     Past Surgical History:  Past Surgical History  Procedure Laterality Date  . Back surgery    . Colon resection    . Hernia repair    . Tonsillectomy    . Cardiac catheterization  06/19/2014  . Coronary angioplasty    . Coronary stent placement  06/19/2014    DES       dr Martinique  . Left and right heart catheterization with coronary angiogram N/A 06/19/2014    Procedure: LEFT AND RIGHT HEART CATHETERIZATION WITH CORONARY ANGIOGRAM;  Surgeon: Peter M Martinique, MD;  Location: [facility] CATH LAB;  Service: Cardiovascular;  Laterality: N/A;    [name] [name] is a 82 y.o. male patient, arrived to floor in room 5W06 via stretcher, transferred from ED. Patient alert and oriented X 4. No acute distress noted.  Complains of pain 7/10 pain in left hand, but does not want any pain medicine at this time.   Vital signs: Oral temperature 98.1 F (36.7 C), Blood pressure 129/62, Pulse 77, RR 17, SpO2 98 % on room air. Height 5'10", weight 107.7 kg.   Cardiac monitoring: Telemetry box 5W # 14 in place.  IV access: Right forearm; condition patent and no redness. Dressing is clean, dry, and intact.  Skin: intact, no pressure ulcer noted in sacral area. Bruising noted on patient's face, arms, and knees.  Patient's ID armband verified with patient and in place. Information packet given to patient. Fall risk assessed, SR up X2, patient able to verbalize understanding of risks associated with falls and to call nurse or staff to assist  before getting out of bed. Patient oriented to room and equipment. Call bell within reach.

## 2016-01-16 ENCOUNTER — Observation Stay (HOSPITAL_BASED_OUTPATIENT_CLINIC_OR_DEPARTMENT_OTHER): Payer: Medicare Other

## 2016-01-16 DIAGNOSIS — K922 Gastrointestinal hemorrhage, unspecified: Secondary | ICD-10-CM | POA: Diagnosis not present

## 2016-01-16 DIAGNOSIS — R55 Syncope and collapse: Secondary | ICD-10-CM | POA: Diagnosis not present

## 2016-01-16 DIAGNOSIS — I10 Essential (primary) hypertension: Secondary | ICD-10-CM | POA: Diagnosis not present

## 2016-01-16 DIAGNOSIS — I2510 Atherosclerotic heart disease of native coronary artery without angina pectoris: Secondary | ICD-10-CM | POA: Diagnosis not present

## 2016-01-16 DIAGNOSIS — S52182A Other fracture of upper end of left radius, initial encounter for closed fracture: Secondary | ICD-10-CM | POA: Diagnosis not present

## 2016-01-16 LAB — CBC
HCT: 29.8 % — ABNORMAL LOW (ref 39.0–52.0)
Hemoglobin: 9.5 g/dL — ABNORMAL LOW (ref 13.0–17.0)
MCH: 30.3 pg (ref 26.0–34.0)
MCHC: 31.9 g/dL (ref 30.0–36.0)
MCV: 94.9 fL (ref 78.0–100.0)
Platelets: 213 10*3/uL (ref 150–400)
RBC: 3.14 MIL/uL — ABNORMAL LOW (ref 4.22–5.81)
RDW: 14.4 % (ref 11.5–15.5)
WBC: 6.7 10*3/uL (ref 4.0–10.5)

## 2016-01-16 LAB — ECHOCARDIOGRAM COMPLETE
Height: 70 in
Weight: 3827.19 oz

## 2016-01-16 LAB — GLUCOSE, CAPILLARY
Glucose-Capillary: 91 mg/dL (ref 65–99)
Glucose-Capillary: 132 mg/dL — ABNORMAL HIGH (ref 65–99)
Glucose-Capillary: 90 mg/dL (ref 65–99)
Glucose-Capillary: 174 mg/dL — ABNORMAL HIGH (ref 65–99)

## 2016-01-16 LAB — BASIC METABOLIC PANEL
Anion gap: 7 (ref 5–15)
BUN: 37 mg/dL — ABNORMAL HIGH (ref 6–20)
CO2: 24 mmol/L (ref 22–32)
Calcium: 8.4 mg/dL — ABNORMAL LOW (ref 8.9–10.3)
Chloride: 105 mmol/L (ref 101–111)
Creatinine, Ser: 1.64 mg/dL — ABNORMAL HIGH (ref 0.61–1.24)
GFR calc Af Amer: 44 mL/min — ABNORMAL LOW (ref >60)
GFR calc non Af Amer: 38 mL/min — ABNORMAL LOW (ref >60)
Glucose, Bld: 99 mg/dL (ref 65–99)
Potassium: 4.5 mmol/L (ref 3.5–5.1)
Sodium: 136 mmol/L (ref 135–145)

## 2016-01-16 LAB — HEMOGLOBIN A1C
Hgb A1c MFr Bld: 6.8 % — ABNORMAL HIGH (ref 4.8–5.6)
Mean Plasma Glucose: 148 mg/dL

## 2016-01-16 NOTE — Progress Notes (Signed)
Orthopedic Tech Progress Note Patient Details:  [name] [name] 04-20-1933 [id number]  Ortho Devices Type of Ortho Device: Ace wrap, Sugartong splint Ortho Device/Splint Location: lue Ortho Device/Splint Interventions: Application   [name] 01/16/2016, 10:52 AM

## 2016-01-16 NOTE — Consult Note (Signed)
ORTHOPAEDIC CONSULTATION HISTORY & PHYSICAL REQUESTING PHYSICIAN: [name], MD  Chief Complaint: left elbow pain  HPI: [name] [name] is a 82 y.o. male who was admitted yesterday for syncope work-up, having fallen again about a week after a previous fall.  He has been noted to have pain in the shoulders, with chronic rotator cuff deficiencies in the shoulders.  He reports elbow pain, but says he had elbow pain after his fall on 3-24, for which he saw [name].  The patient believes he had some kind of elbow fracture diagnosed at that time. Forearm x-rays from yesterday reveal a nondisplaced fracture of the radial neck.  Past Medical History  Diagnosis Date  . Diabetes ([place])   . Obesity   . Ulcerative colitis ([place])   . HTN (hypertension)   . Dyslipidemia   . GERD (gastroesophageal reflux disease)   . History of peptic ulcer disease   . Ulcerative colitis ([place])     a. s/p total colectomy remotely.  . Anemia   . Macular degeneration   . Kidney stone may 2009    Right hydronephrosis, S/P stone removal  . Small bowel obstruction, partial ([place]) 2009    Episode  . Shingles     episode  . Compression fracture of L1 lumbar vertebra ([name])   . Osteopenia   . Lower back pain   . OA (osteoarthritis) of hip   . CKD (chronic kidney disease), stage III   . Hx of acute renal failure 01/02/10-3/24-11    due to hypovolemic shock, gastroenteritis and dehydration,hospitalized . Did requre a few days of dialysis. Cr at discharge was 1.8  . PAF (paroxysmal atrial fibrillation) ([name])     not on long term anticoagulation due to history of heme positive stools and anemia  . Frequent PVCs   . CAD (coronary artery disease) 06/2014    a. 06/2014: abnl nuc. Cath: Totally occluded LAD with faint collaterals (not a candidate for CTO PCI), 90% ramus s/p DES, 50-70% OM2.  . Complication of anesthesia     " during my kidney stone surgery my heart went out of rhythm  . History of blood transfusion   .  Atrial flutter ([place])     a. s/p RFCA of counterclockwise cavo-tricuspid isthmus dependent atrial flutter 2009 by Dr. Rayann Heman.  . Chronic diastolic CHF (congestive heart failure) ([place])     a. 2D Echo 10/23/13: EF 50-55%, basal mid-inf HK, grade 2 DD, mild MR, mod dilated LA, no sig change from prior.  . Kidney disease    Past Surgical History  Procedure Laterality Date  . Back surgery    . Colon resection    . Hernia repair    . Tonsillectomy    . Cardiac catheterization  06/19/2014  . Coronary angioplasty    . Coronary stent placement  06/19/2014    DES       dr Martinique  . Left and right heart catheterization with coronary angiogram N/A 06/19/2014    Procedure: LEFT AND RIGHT HEART CATHETERIZATION WITH CORONARY ANGIOGRAM;  Surgeon: Peter M Martinique, MD;  Location: [facility] CATH LAB;  Service: Cardiovascular;  Laterality: N/A;   Social History   Social History  . Marital Status: Widowed    Spouse Name: N/A  . Number of Children: N/A  . Years of Education: N/A   Social History Main Topics  . Smoking status: Never Smoker   . Smokeless tobacco: Never Used  . Alcohol Use: No  . Drug Use:  No  . Sexual Activity: Not Asked   Other Topics Concern  . None   Social History Narrative   Family History  Problem Relation Age of Onset  . Colon cancer Mother    Allergies  Allergen Reactions  . Brilinta [Ticagrelor]     Changed to Plavix due to SOB  . Penicillins Rash    Has patient had a PCN reaction causing immediate rash, facial/tongue/throat swelling, SOB or lightheadedness with hypotension:YES Has patient had a PCN reaction causing severe rash involving mucus membranes or skin necrosis: NO Has patient had a PCN reaction that required hospitalization No Has patient had a PCN reaction occurring within the last 10 years: NO If all of the above answers are "NO", then may proceed with Cephalosporin use.  . Sulfa Antibiotics Rash   Prior to Admission medications   Medication Sig Start Date End  Date Taking? Authorizing Provider  amiodarone (PACERONE) 200 MG tablet Take 1 tablet (200 mg total) by mouth daily. 11/27/14  Yes Sueanne Margarita, MD  aspirin 81 MG tablet Take 81 mg by mouth daily.   Yes Historical Provider, MD  calcium carbonate (OS-CAL) 600 MG TABS tablet Take 600 mg by mouth daily with breakfast.   Yes Historical Provider, MD  ferrous sulfate 325 (65 FE) MG tablet Take 1 tablet (325 mg total) by mouth 2 (two) times daily with a meal. 10/27/14  Yes Florencia Reasons, MD  furosemide (LASIX) 20 MG tablet Take 1 tablet (20 mg total) by mouth every other day. 01/13/16  Yes Sueanne Margarita, MD  glimepiride (AMARYL) 2 MG tablet Take 2 mg by mouth 2 (two) times daily before a meal.   Yes Historical Provider, MD  omeprazole (PRILOSEC) 20 MG capsule TAKE 1 CAPSULE (20 MG TOTAL) BY MOUTH DAILY. 12/01/15  Yes Irene Shipper, MD  pioglitazone (ACTOS) 30 MG tablet Take 1 tablet (30 mg total) by mouth daily. 11/05/14  Yes Dayna N Dunn, PA-C  ranitidine (ZANTAC) 75 MG tablet Take 150 mg by mouth every morning. Patient unsure of exact dosage   Yes Historical Provider, MD  saxagliptin HCl (ONGLYZA) 5 MG TABS tablet Take 1 tablet (5 mg total) by mouth daily. 06/20/14  Yes Tanda Rockers, MD  simvastatin (ZOCOR) 20 MG tablet Take 20 mg by mouth daily.   Yes Historical Provider, MD  tamsulosin (FLOMAX) 0.4 MG CAPS capsule Take 1 capsule (0.4 mg total) by mouth daily after supper. 10/27/14  Yes Florencia Reasons, MD  metoprolol tartrate (LOPRESSOR) 25 MG tablet Take 0.5 tablets (12.5 mg total) by mouth 2 (two) times daily. Patient not taking: Reported on 01/14/2016 12/26/14   Sueanne Margarita, MD   Dg Chest 2 View  01/14/2016  CLINICAL DATA:  Weakness EXAM: CHEST  2 VIEW COMPARISON:  10/22/2014 chest radiograph. FINDINGS: Stable cardiomediastinal silhouette with mild cardiomegaly. No pneumothorax. No pleural effusion. Mild pulmonary edema. Mild bibasilar atelectasis. IMPRESSION: 1. Mild congestive heart failure. 2. Mild bibasilar  atelectasis. Electronically Signed   By: Ilona Sorrel M.D.   On: 01/14/2016 18:06   Dg Pelvis 1-2 Views  01/14/2016  CLINICAL DATA:  Status post fall, with bilateral leg weakness. Initial encounter. EXAM: PELVIS - 1-2 VIEW COMPARISON:  CT of the abdomen and pelvis from 10/22/2014 FINDINGS: There is no evidence of fracture or dislocation. Both femoral heads are seated normally within their respective acetabula. Mild degenerative change is noted at the lower lumbar spine. The sacroiliac joints are unremarkable in appearance. The visualized bowel  gas pattern is grossly unremarkable in appearance. Scattered clips and phleboliths are noted within the pelvis. IMPRESSION: No evidence of fracture or dislocation. Electronically Signed   By: Garald Balding M.D.   On: 01/14/2016 23:12   Dg Shoulder Right  01/15/2016  CLINICAL DATA:  Status post fall 5 days ago with bruising over both shoulders EXAM: RIGHT SHOULDER - 2+ VIEW COMPARISON:  Chest x-ray of October 22, 2014 which included a portion of the right shoulder. FINDINGS: The bones are subjectively osteopenic. There is marked narrowing of the subacromial subdeltoid space with a bone on bone appearance. The glenohumeral joint space is reasonably well maintained though the humeral head is superiorly positioned. The [facility] joint exhibits mild degenerative change. No acute fracture nor dislocation is observed. The observed portions of the right clavicle and upper right ribs are normal. IMPRESSION: No acute bony abnormality of the right shoulder is observed. There is marked narrowing of the subacromial subdeltoid space which likely reflects significant rotator cuff pathology. Electronically Signed   By: Hermilo Dutter  [place] M.D.   On: 01/15/2016 16:53   Dg Forearm Left  01/15/2016  CLINICAL DATA:  Status post fall 4 days ago with bruising of the anterior left forearm. EXAM: LEFT FOREARM - 2 VIEW COMPARISON:  None in PACs FINDINGS: AP and lateral views of the left forearm reveal the  radius and ulna to be subjectively osteopenic. There is a mildly impacted fracture of the radial head. The adjacent olecranon appears intact. Similarly limited visualization of the wrist reveals no fracture or other acute bony abnormality. The soft tissues are unremarkable. IMPRESSION: There is an acute mildly impacted fracture of the left radial head. No acute fracture is observed elsewhere. Electronically Signed   By: Leshea Jaggers  [place] M.D.   On: 01/15/2016 16:57   Ct Head Wo Contrast  01/14/2016  CLINICAL DATA:  PT FELL FLAT ON FACE THIS AFTERNOON LARGE HEMATOMAS LT SIDE OF CHEEK, TEMPORAL, PARIETAL, FRONTAL PT COLLARED NECK PAIN MOSTLY ON LEFT EXAM: CT HEAD WITHOUT CONTRAST CT MAXILLOFACIAL WITHOUT CONTRAST CT CERVICAL SPINE WITHOUT CONTRAST TECHNIQUE: Multidetector CT imaging of the head, cervical spine, and maxillofacial structures were performed using the standard protocol without intravenous contrast. Multiplanar CT image reconstructions of the cervical spine and maxillofacial structures were also generated. COMPARISON:  Head CT, 05/26/2008 FINDINGS: CT HEAD FINDINGS Left frontal scalp hematoma.  No skull fracture. The ventricles are normal in size, for this patient's age, and normal in configuration. There are no parenchymal masses or mass effect. There is no evidence of an infarct. There are no extra-axial masses or abnormal fluid collections. There is no intracranial hemorrhage. CT MAXILLOFACIAL FINDINGS There are nondisplaced fractures at the base of the nasal bridge. These may not be acute, but this appears be a change from the prior CT. No other evidence of an acute fracture. There is an old left medial orbital wall fracture. Right maxillary sinuses chronically opacified as it was on the prior study. Mild mucosal thickening noted along several of the right ethmoid air cells. The mastoid air cells and middle ear cavities are clear. Status post bilateral cataract surgery. Globes and orbits are otherwise  unremarkable. No soft tissue masses or adenopathy. No hematoma. There are degenerative changes of both temporomandibular joints. CT CERVICAL SPINE FINDINGS No fracture.  No spondylolisthesis. Mild loss of disc height at C5-C6 and C6-C7. There are small endplate osteophytes at these levels. There is facet degenerative change most evident on the left at C2-C3 and C3-C4 and on the  right at C4-C5. Soft tissues are unremarkable other than 12 mm left thyroid nodule and carotid vascular calcifications. Lung apices are clear. IMPRESSION: HEAD CT: No acute intracranial abnormalities. No skull fracture. Left frontal scalp hematoma. MAXILLOFACIAL CT: Nondisplaced fractures at the base of the nasal bridge. These may not be acute but should be considered acute if they correlate with the site of injury/tenderness. No other evidence of a fracture. CERVICAL CT:  No fracture or acute finding. Electronically Signed   By: Lajean Manes M.D.   On: 01/14/2016 19:01   Ct Cervical Spine Wo Contrast  01/14/2016  CLINICAL DATA:  PT FELL FLAT ON FACE THIS AFTERNOON LARGE HEMATOMAS LT SIDE OF CHEEK, TEMPORAL, PARIETAL, FRONTAL PT COLLARED NECK PAIN MOSTLY ON LEFT EXAM: CT HEAD WITHOUT CONTRAST CT MAXILLOFACIAL WITHOUT CONTRAST CT CERVICAL SPINE WITHOUT CONTRAST TECHNIQUE: Multidetector CT imaging of the head, cervical spine, and maxillofacial structures were performed using the standard protocol without intravenous contrast. Multiplanar CT image reconstructions of the cervical spine and maxillofacial structures were also generated. COMPARISON:  Head CT, 05/26/2008 FINDINGS: CT HEAD FINDINGS Left frontal scalp hematoma.  No skull fracture. The ventricles are normal in size, for this patient's age, and normal in configuration. There are no parenchymal masses or mass effect. There is no evidence of an infarct. There are no extra-axial masses or abnormal fluid collections. There is no intracranial hemorrhage. CT MAXILLOFACIAL FINDINGS There  are nondisplaced fractures at the base of the nasal bridge. These may not be acute, but this appears be a change from the prior CT. No other evidence of an acute fracture. There is an old left medial orbital wall fracture. Right maxillary sinuses chronically opacified as it was on the prior study. Mild mucosal thickening noted along several of the right ethmoid air cells. The mastoid air cells and middle ear cavities are clear. Status post bilateral cataract surgery. Globes and orbits are otherwise unremarkable. No soft tissue masses or adenopathy. No hematoma. There are degenerative changes of both temporomandibular joints. CT CERVICAL SPINE FINDINGS No fracture.  No spondylolisthesis. Mild loss of disc height at C5-C6 and C6-C7. There are small endplate osteophytes at these levels. There is facet degenerative change most evident on the left at C2-C3 and C3-C4 and on the right at C4-C5. Soft tissues are unremarkable other than 12 mm left thyroid nodule and carotid vascular calcifications. Lung apices are clear. IMPRESSION: HEAD CT: No acute intracranial abnormalities. No skull fracture. Left frontal scalp hematoma. MAXILLOFACIAL CT: Nondisplaced fractures at the base of the nasal bridge. These may not be acute but should be considered acute if they correlate with the site of injury/tenderness. No other evidence of a fracture. CERVICAL CT:  No fracture or acute finding. Electronically Signed   By: Lajean Manes M.D.   On: 01/14/2016 19:01   Mr Brain Wo Contrast  01/15/2016  CLINICAL DATA:  Initial evaluation for acute near syncope, fall. EXAM: MRI HEAD WITHOUT CONTRAST TECHNIQUE: Multiplanar, multiecho pulse sequences of the brain and surrounding structures were obtained without intravenous contrast. COMPARISON:  Prior CT from 01/14/2016. FINDINGS: Age-related cerebral atrophy with mild chronic small vessel ischemic disease present. Small remote left cerebellar infarct noted. No abnormal foci of restricted  diffusion to suggest acute infarct. Major intracranial vascular flow voids are maintained. Gray-white matter differentiation preserved. No acute or chronic intracranial hemorrhage. No mass lesion, midline shift, or mass effect. No hydrocephalus. No extra-axial fluid collection. Major dural sinuses are grossly patent. Craniocervical junction within normal limits. Incidental note  made of an empty sella. No acute abnormality about the orbits. Increase CSF density along the optic nerve sheaths bilaterally. Remote posttraumatic defect at the left lamina papyracea. Sequ[place] prior bilateral lens extraction noted. Chronic right maxillary sinus disease noted. Mild mucosal thickening within the left sphenoid sinus. Paranasal sinuses are otherwise clear. No significant mastoid effusion. Inner ear structures normal. Fatty atrophy of the parotid glands noted. Bone marrow signal intensity within normal limits. Left frontal scalp contusion noted. IMPRESSION: 1. No acute intracranial process. 2. Left frontal scalp contusion. 3. Small remote left cerebellar infarct. 4. Empty sella with increased CSF fluid along the optic nerve sheaths. Findings are nonspecific, and often incidental, but can be seen in the setting of elevated intracranial pressures. 5. Age-related cerebral atrophy with mild chronic small vessel ischemic disease. 6. Chronic right maxillary sinus disease. Electronically Signed   By: Jeannine Boga M.D.   On: 01/15/2016 00:29   Mr Cervical Spine Wo Contrast  01/15/2016  CLINICAL DATA:  Initial evaluation for acute trauma, fall. EXAM: MRI CERVICAL SPINE WITHOUT CONTRAST TECHNIQUE: Multiplanar, multisequence MR imaging of the cervical spine was performed. No intravenous contrast was administered. COMPARISON:  Prior CT from earlier the same day. FINDINGS: Visualized portions of the brain and posterior fossa are unremarkable. Craniocervical junction widely patent. Vertebral bodies are normally aligned with  preservation of the normal cervical lordosis paravertebral body heights are well preserved. No fracture or malalignment. Degenerative thickening of the tectorial membrane noted. Degenerative geode at the anterior aspect of the dens. Signal intensity within the vertebral body bone marrow is normal. No focal osseous lesions. No marrow edema. Signal intensity within the cervical spinal cord is normal. Paraspinous soft tissues within normal limits. No prevertebral edema. C2-C3: Mild bilateral uncovertebral spurring and facet arthrosis. No significant canal or foraminal narrowing. C3-C4: Mild bilateral uncovertebral hypertrophy. Left-sided facet arthrosis. Resultant mild to moderate left foraminal narrowing. No significant right foraminal or central canal stenosis. C4-C5: Mild bilateral uncovertebral hypertrophy and facet arthrosis. Resultant moderate right with mild left foraminal narrowing. No canal stenosis. C5-C6: Bilateral uncovertebral hypertrophy and facet arthrosis. Mild posterior disc bulge. Resultant mild to moderate bilateral foraminal stenosis. Bulging disc mildly flattens the ventral thecal sac without significant canal stenosis. C6-C7: Mild bilateral uncovertebral spurring, left greater than right. Broad-based posterior disc bulge mildly flattens the ventral thecal sac without significant stenosis. Resultant severe left with moderate right foraminal stenosis, left worse than right. C7-T1:  Mild bilateral uncovertebral hypertrophy without stenosis. Visualized upper thoracic spine within normal limits. No significant stenosis. IMPRESSION: 1. No acute traumatic injury within the cervical spine. 2. Relatively mild for age multilevel degenerative spondylolysis and facet arthrosis. Resultant multilevel foraminal narrowing as above, most severe at the C6-7 level were there is severe left and moderate right foraminal narrowing. No significant canal stenosis. Please see above report for a full description of these  findings. Electronically Signed   By: Jeannine Boga M.D.   On: 01/15/2016 00:58   Dg Shoulder Left  01/15/2016  CLINICAL DATA:  82 year old male with acute left shoulder pain following fall 4 days ago. Initial encounter. EXAM: LEFT SHOULDER - 2+ VIEW COMPARISON:  None. FINDINGS: There is no evidence of acute fracture, subluxation or dislocation. High-riding humeral head is compatible with rotator cuff tear -likely chronic. No focal bony lesions are identified. IMPRESSION: Rotator cuff tear - almost certainly chronic. No evidence of acute bony abnormality. Electronically Signed   By: Margarette Canada M.D.   On: 01/15/2016 16:57   Ct  Maxillofacial Wo Cm  01/14/2016  CLINICAL DATA:  PT FELL FLAT ON FACE THIS AFTERNOON LARGE HEMATOMAS LT SIDE OF CHEEK, TEMPORAL, PARIETAL, FRONTAL PT COLLARED NECK PAIN MOSTLY ON LEFT EXAM: CT HEAD WITHOUT CONTRAST CT MAXILLOFACIAL WITHOUT CONTRAST CT CERVICAL SPINE WITHOUT CONTRAST TECHNIQUE: Multidetector CT imaging of the head, cervical spine, and maxillofacial structures were performed using the standard protocol without intravenous contrast. Multiplanar CT image reconstructions of the cervical spine and maxillofacial structures were also generated. COMPARISON:  Head CT, 05/26/2008 FINDINGS: CT HEAD FINDINGS Left frontal scalp hematoma.  No skull fracture. The ventricles are normal in size, for this patient's age, and normal in configuration. There are no parenchymal masses or mass effect. There is no evidence of an infarct. There are no extra-axial masses or abnormal fluid collections. There is no intracranial hemorrhage. CT MAXILLOFACIAL FINDINGS There are nondisplaced fractures at the base of the nasal bridge. These may not be acute, but this appears be a change from the prior CT. No other evidence of an acute fracture. There is an old left medial orbital wall fracture. Right maxillary sinuses chronically opacified as it was on the prior study. Mild mucosal thickening noted  along several of the right ethmoid air cells. The mastoid air cells and middle ear cavities are clear. Status post bilateral cataract surgery. Globes and orbits are otherwise unremarkable. No soft tissue masses or adenopathy. No hematoma. There are degenerative changes of both temporomandibular joints. CT CERVICAL SPINE FINDINGS No fracture.  No spondylolisthesis. Mild loss of disc height at C5-C6 and C6-C7. There are small endplate osteophytes at these levels. There is facet degenerative change most evident on the left at C2-C3 and C3-C4 and on the right at C4-C5. Soft tissues are unremarkable other than 12 mm left thyroid nodule and carotid vascular calcifications. Lung apices are clear. IMPRESSION: HEAD CT: No acute intracranial abnormalities. No skull fracture. Left frontal scalp hematoma. MAXILLOFACIAL CT: Nondisplaced fractures at the base of the nasal bridge. These may not be acute but should be considered acute if they correlate with the site of injury/tenderness. No other evidence of a fracture. CERVICAL CT:  No fracture or acute finding. Electronically Signed   By: Lajean Manes M.D.   On: 01/14/2016 19:01    Positive ROS: All other systems have been reviewed and were otherwise negative with the exception of those mentioned in the HPI and as above.  Physical Exam: Vitals: Refer to EMR. Constitutional:  WD, WN, NAD HEENT:  NCAT, EOMI Neuro/Psych:  Alert & oriented; appropriate mood & affect Lymphatic: No generalized extremity edema or lymphadenopathy Extremities / MSK:  The extremities are normal with respect to appearance, ranges of motion, joint stability, muscle strength/tone, sensation, & perfusion except as otherwise noted:  TTP left proximal radius, increased pain with forearm pronation/supination with palpation at the proximal radius.  Otherwise good elbow motion, and VI distally.  Assessment: Mildly impacted and otherwise nondisplaced left proximal radius fracture  Plan: This  injury can be expected in most instances to heal uneventfully.  In order to minimize risk for stiffness and decreased range of motion of the elbow, I recommend early mobilization, resting it in a sling intermittently as he desires.  The sling is a comfort device, and he may remove it whenever he wishes.  Avoid weightbearing upon an outstretched hand until pain has subsided.  Follow-up at [facility] with me or [name] (if that was already the plan from last week)  Rayvon Char. Grandville Silos, MD  [place], [place]  [id number] Office: [phone] Mobile: [phone]  01/16/2016, 12:25 PM

## 2016-01-16 NOTE — Progress Notes (Signed)
Physical Therapy Treatment Patient Details Name: LON KLIPPEL MRN: [id number] DOB: 06-13-1933 Today's Date: 01/16/2016    History of Present Illness Pt adm with recurrent falls. PMH - chf, afib, CAD, DM, colostomy    PT Comments    Pt returned to bed for ECHO following ambulation in hallway. X ray revealed L elbow fx. Pt now has sling for comfort. Pt ambulated with RW due to WB restrictions LUE. Per MD note, no WB through outstretched arm.  Follow Up Recommendations  SNF;Supervision for mobility/OOB     Equipment Recommendations  None recommended by PT    Recommendations for Other Services OT consult     Precautions / Restrictions Precautions Precautions: Fall Precaution Comments: colostomy    Mobility  Bed Mobility Overal bed mobility: Needs Assistance Bed Mobility: Supine to Sit;Sit to Supine     Supine to sit: Min assist Sit to supine: Min guard   General bed mobility comments: increased assist with sup to sit due to inability to WB through L elbow  Transfers   Equipment used: None   Sit to Stand: Min assist         General transfer comment: Assist for balance  Ambulation/Gait Ambulation/Gait assistance: Min assist Ambulation Distance (Feet): 130 Feet Assistive device: None Gait Pattern/deviations: Wide base of support;Decreased stride length Gait velocity: verbal cues to slow down for safety Gait velocity interpretation: at or above normal speed for age/gender General Gait Details: unsteady gait, exagerated lateral weight shifting, no RW due to L elbow fx. Sling removed for ambulation to have arm swing assist with balance.   Stairs            Wheelchair Mobility    Modified Rankin (Stroke Patients Only)       Balance     Sitting balance-[name]: Good     Standing balance support: During functional activity;No upper extremity supported Standing balance-[name]: Fair                      Cognition Arousal/Alertness:  Awake/alert Behavior During Therapy: WFL for tasks assessed/performed Overall Cognitive Status: Within Functional Limits for tasks assessed                      Exercises      General Comments        Pertinent Vitals/Pain Pain Assessment: Faces Faces Pain Scale: Hurts a little bit Pain Location: LUE Pain Descriptors / Indicators: Sore Pain Intervention(s): Monitored during session    Home Living                      Prior Function            PT Goals (current goals can now be found in the care plan section) Acute Rehab PT Goals Patient Stated Goal: Return home PT Goal Formulation: With patient Time For Goal Achievement: 01/29/16 Potential to Achieve Goals: Good Progress towards PT goals: Progressing toward goals    Frequency  Min 3X/week    PT Plan Current plan remains appropriate;Equipment recommendations need to be updated    Co-evaluation             End of Session Equipment Utilized During Treatment: Gait belt Activity Tolerance: Patient tolerated treatment well Patient left: in bed;with call bell/phone within reach;Other (comment) (ECHO tech in room.)     Time: 0355-9741 PT Time Calculation (min) (ACUTE ONLY): 14 min  Charges:  $Gait Training: 8-22 mins  G Codes:  Functional Assessment Tool Used: clinical judgement Functional Limitation: Mobility: Walking and moving around Mobility: Walking and Moving Around Current Status [phone]): At least 20 percent but less than 40 percent impaired, limited or restricted Mobility: Walking and Moving Around Goal Status [phone]): At least 1 percent but less than 20 percent impaired, limited or restricted   Lorriane Shire 01/16/2016, 3:01 PM

## 2016-01-16 NOTE — Progress Notes (Signed)
Orthopedic Tech Progress Note Patient Details:  [name] [name] 03-06-1933 [id number]  Ortho Devices Type of Ortho Device: Arm sling Ortho Device/Splint Location: lue Ortho Device/Splint Interventions: Application Sugartong  Is cancelled by Dr. Stephannie Li, Ludmilla Mcgillis 01/16/2016, 2:41 PM

## 2016-01-16 NOTE — Progress Notes (Signed)
[facility]                                                                              Patient Demographics  [name] [name], is a 82 y.o. male, DOB - 09/09/1933, TRV:[id number]  Admit date - 01/14/2016   Admitting Physician [name] Patience, MD  Outpatient Primary MD for the patient is [name] Low, MD  LOS -   days    Chief Complaint  Patient presents with  . multiple falls        Brief HPI   [name] [name] is a 82 y.o. male with history of diastolic CHF, CAD, diabetes mellitus, chronic disease chronic anemia and ulcerative colitis and paroxysmal atrial fibrillation who was referred to the ER by patient's primary care physician as patient has been having recurrent falls and has bruised his face after his last fall. Patient also has sustained an injury to his right shoulder which he has followed up with his primary orthopedic surgeon last week. Patient was referred to the physical therapy but patient had a fall during the process. 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 - PT evaluation recommended skilled nursing facility - 2-D echo pending  Active Problems: Bilateral arms pain, right shoulder pain, Left radial fracture -  X-ray of the forearm left showed acute mildly impacted fracture of the left radial head - both shoulder Xray showed chronic rotator cuff tear - Patient reports that he has seen [name] [name] in the past, called consult, discussed with [name] [name], recommended shoulder sling, will see patient.     CAD in native artery- total LAD, s/p RI DES 06/19/14 - Currently no chest pain, continue aspirin, beta blocker, statins  diastolic CHF, chronic - 2-D echo 50-55%  with grade 2 diastolic dysfunction in 86/1683 - Negative balance of 1.47 L, continue home dose of Lasix  History of GI bleed - Follow CBC, currently at baseline  Chronic diabetes mellitus type 2, with renal complications - Place on sliding scale insulin while inpatient  Chronic kidney disease stage III - Currently creatinine function at baseline  Paroxysmal Atrial fibrillation - Currently normal sinus rhythm, continue amiodarone and beta blocker - CHADS vasc 5, not on anticoagulation secondary to GI bleed and falls   Code Status: DO NOT RESUSCITATE  Family Communication: Discussed in detail with the patient, all imaging results, lab results explained to the patient   Disposition Plan: Unable to take care of himself at home with falls, will need a skilled nursing facility  Time Spent in minutes   25 minutes  Procedures  MRI brain and C-spine  Consults   [name]  DVT Prophylaxis  Lovenox   Medications  Scheduled Meds: . amiodarone  200 mg Oral Daily  . aspirin EC  81 mg Oral Daily  . calcium carbonate  1,250 mg Oral Q breakfast  . enoxaparin (LOVENOX) injection  40 mg Subcutaneous Daily  . famotidine  20 mg Oral BID  . ferrous sulfate  325 mg Oral BID WC  . furosemide  20 mg Oral QODAY  . glimepiride  2 mg Oral BID WC  . insulin aspart  0-9 Units Subcutaneous TID WC  . linagliptin  5 mg Oral Daily  . pantoprazole  40 mg Oral Daily  . pioglitazone  30 mg Oral Q breakfast  . simvastatin  20 mg Oral Daily  . tamsulosin  0.4 mg Oral QPC supper   Continuous Infusions:  PRN Meds:.   Antibiotics   Anti-infectives    None        Subjective:   [name] [name] was seen and examined today.  Complaining of swelling in the left wrist area, tenderness, sore shoulders. Patient denies dizziness, chest pain, shortness of breath, abdominal pain, N/V/D/C, new weakness, numbess, tingling. No acute events overnight.    Objective:   Filed Vitals:   01/15/16 0634  01/15/16 1419 01/15/16 2157 01/16/16 0652  BP: 131/48 93/49 106/39 98/54  Pulse: 73 70 66 73  Temp: 97.8 F (36.6 C) 97.8 F (36.6 C) 97.7 F (36.5 C) 97.7 F (36.5 C)  TempSrc: Oral Oral  Oral  Resp: 18 18 18 18   Height:      Weight:    108.5 kg (239 lb 3.2 oz)  SpO2: 97% 96% 97% 98%    Intake/Output Summary (Last 24 hours) at 01/16/16 1119 Last data filed at 01/16/16 0650  Gross per 24 hour  Intake    240 ml  Output   1350 ml  Net  -1110 ml     Wt Readings from Last 3 Encounters:  01/16/16 108.5 kg (239 lb 3.2 oz)  01/12/16 112.492 kg (248 lb)  07/08/15 115.722 kg (255 lb 1.9 oz)     Exam  General: Alert and oriented x 3, NAD  HEENT:  Bruising on the face   Neck:   CVS: S1 S2 auscultated, no rubs, murmurs or gallops. Regular rate and rhythm.  Respiratory: Clear to auscultation bilaterally, no wheezing, rales or rhonchi  Abdomen: Soft, nontender, nondistended, + bowel sounds, +Ostomy   Ext: no cyanosis clubbing or edema, Right shoulder pain, left forearm pain, swelling left wrist   Neuro:  Skin: No rashes  Psych: Normal affect and demeanor, alert and oriented x3    Data Reviewed:  I have personally reviewed following labs and imaging studies  Micro Results No results found for this or any previous visit (from the past 240 hour(s)).  Radiology Reports Dg Chest 2 View  01/14/2016  CLINICAL DATA:  Weakness EXAM: CHEST  2 VIEW COMPARISON:  10/22/2014 chest radiograph. FINDINGS: Stable cardiomediastinal silhouette with mild cardiomegaly. No pneumothorax. No pleural effusion. Mild pulmonary edema. Mild bibasilar atelectasis. IMPRESSION: 1. Mild congestive heart failure. 2. Mild bibasilar atelectasis. Electronically Signed   By: Ilona Sorrel M.D.   On: 01/14/2016 18:06   Dg Pelvis 1-2 Views  01/14/2016  CLINICAL DATA:  Status post fall, with bilateral leg weakness. Initial encounter. EXAM: PELVIS - 1-2 VIEW COMPARISON:  CT of the abdomen and pelvis from  10/22/2014 FINDINGS: There is no evidence of fracture or dislocation. Both femoral heads are seated normally within their respective acetabula. Mild degenerative change is noted at the lower lumbar spine. The sacroiliac joints are unremarkable in appearance. The visualized bowel gas pattern is grossly unremarkable in appearance. Scattered clips and phleboliths are noted within the pelvis. IMPRESSION: No evidence of fracture or  dislocation. Electronically Signed   By: Garald Balding M.D.   On: 01/14/2016 23:12   Dg Shoulder Right  01/15/2016  CLINICAL DATA:  Status post fall 5 days ago with bruising over both shoulders EXAM: RIGHT SHOULDER - 2+ VIEW COMPARISON:  Chest x-ray of October 22, 2014 which included a portion of the right shoulder. FINDINGS: The bones are subjectively osteopenic. There is marked narrowing of the subacromial subdeltoid space with a bone on bone appearance. The glenohumeral joint space is reasonably well maintained though the humeral head is superiorly positioned. The [facility] joint exhibits mild degenerative change. No acute fracture nor dislocation is observed. The observed portions of the right clavicle and upper right ribs are normal. IMPRESSION: No acute bony abnormality of the right shoulder is observed. There is marked narrowing of the subacromial subdeltoid space which likely reflects significant rotator cuff pathology. Electronically Signed   By: David  Martinique M.D.   On: 01/15/2016 16:53   Dg Forearm Left  01/15/2016  CLINICAL DATA:  Status post fall 4 days ago with bruising of the anterior left forearm. EXAM: LEFT FOREARM - 2 VIEW COMPARISON:  None in PACs FINDINGS: AP and lateral views of the left forearm reveal the radius and ulna to be subjectively osteopenic. There is a mildly impacted fracture of the radial head. The adjacent olecranon appears intact. Similarly limited visualization of the wrist reveals no fracture or other acute bony abnormality. The soft tissues are  unremarkable. IMPRESSION: There is an acute mildly impacted fracture of the left radial head. No acute fracture is observed elsewhere. Electronically Signed   By: David  Martinique M.D.   On: 01/15/2016 16:57   Ct Head Wo Contrast  01/14/2016  CLINICAL DATA:  PT FELL FLAT ON FACE THIS AFTERNOON LARGE HEMATOMAS LT SIDE OF CHEEK, TEMPORAL, PARIETAL, FRONTAL PT COLLARED NECK PAIN MOSTLY ON LEFT EXAM: CT HEAD WITHOUT CONTRAST CT MAXILLOFACIAL WITHOUT CONTRAST CT CERVICAL SPINE WITHOUT CONTRAST TECHNIQUE: Multidetector CT imaging of the head, cervical spine, and maxillofacial structures were performed using the standard protocol without intravenous contrast. Multiplanar CT image reconstructions of the cervical spine and maxillofacial structures were also generated. COMPARISON:  Head CT, 05/26/2008 FINDINGS: CT HEAD FINDINGS Left frontal scalp hematoma.  No skull fracture. The ventricles are normal in size, for this patient's age, and normal in configuration. There are no parenchymal masses or mass effect. There is no evidence of an infarct. There are no extra-axial masses or abnormal fluid collections. There is no intracranial hemorrhage. CT MAXILLOFACIAL FINDINGS There are nondisplaced fractures at the base of the nasal bridge. These may not be acute, but this appears be a change from the prior CT. No other evidence of an acute fracture. There is an old left medial orbital wall fracture. Right maxillary sinuses chronically opacified as it was on the prior study. Mild mucosal thickening noted along several of the right ethmoid air cells. The mastoid air cells and middle ear cavities are clear. Status post bilateral cataract surgery. Globes and orbits are otherwise unremarkable. No soft tissue masses or adenopathy. No hematoma. There are degenerative changes of both temporomandibular joints. CT CERVICAL SPINE FINDINGS No fracture.  No spondylolisthesis. Mild loss of disc height at C5-C6 and C6-C7. There are small endplate  osteophytes at these levels. There is facet degenerative change most evident on the left at C2-C3 and C3-C4 and on the right at C4-C5. Soft tissues are unremarkable other than 12 mm left thyroid nodule and carotid vascular calcifications. Lung apices are clear.  IMPRESSION: HEAD CT: No acute intracranial abnormalities. No skull fracture. Left frontal scalp hematoma. MAXILLOFACIAL CT: Nondisplaced fractures at the base of the nasal bridge. These may not be acute but should be considered acute if they correlate with the site of injury/tenderness. No other evidence of a fracture. CERVICAL CT:  No fracture or acute finding. Electronically Signed   By: Lajean Manes M.D.   On: 01/14/2016 19:01   Ct Cervical Spine Wo Contrast  01/14/2016  CLINICAL DATA:  PT FELL FLAT ON FACE THIS AFTERNOON LARGE HEMATOMAS LT SIDE OF CHEEK, TEMPORAL, PARIETAL, FRONTAL PT COLLARED NECK PAIN MOSTLY ON LEFT EXAM: CT HEAD WITHOUT CONTRAST CT MAXILLOFACIAL WITHOUT CONTRAST CT CERVICAL SPINE WITHOUT CONTRAST TECHNIQUE: Multidetector CT imaging of the head, cervical spine, and maxillofacial structures were performed using the standard protocol without intravenous contrast. Multiplanar CT image reconstructions of the cervical spine and maxillofacial structures were also generated. COMPARISON:  Head CT, 05/26/2008 FINDINGS: CT HEAD FINDINGS Left frontal scalp hematoma.  No skull fracture. The ventricles are normal in size, for this patient's age, and normal in configuration. There are no parenchymal masses or mass effect. There is no evidence of an infarct. There are no extra-axial masses or abnormal fluid collections. There is no intracranial hemorrhage. CT MAXILLOFACIAL FINDINGS There are nondisplaced fractures at the base of the nasal bridge. These may not be acute, but this appears be a change from the prior CT. No other evidence of an acute fracture. There is an old left medial orbital wall fracture. Right maxillary sinuses chronically  opacified as it was on the prior study. Mild mucosal thickening noted along several of the right ethmoid air cells. The mastoid air cells and middle ear cavities are clear. Status post bilateral cataract surgery. Globes and orbits are otherwise unremarkable. No soft tissue masses or adenopathy. No hematoma. There are degenerative changes of both temporomandibular joints. CT CERVICAL SPINE FINDINGS No fracture.  No spondylolisthesis. Mild loss of disc height at C5-C6 and C6-C7. There are small endplate osteophytes at these levels. There is facet degenerative change most evident on the left at C2-C3 and C3-C4 and on the right at C4-C5. Soft tissues are unremarkable other than 12 mm left thyroid nodule and carotid vascular calcifications. Lung apices are clear. IMPRESSION: HEAD CT: No acute intracranial abnormalities. No skull fracture. Left frontal scalp hematoma. MAXILLOFACIAL CT: Nondisplaced fractures at the base of the nasal bridge. These may not be acute but should be considered acute if they correlate with the site of injury/tenderness. No other evidence of a fracture. CERVICAL CT:  No fracture or acute finding. Electronically Signed   By: Lajean Manes M.D.   On: 01/14/2016 19:01   Mr Brain Wo Contrast  01/15/2016  CLINICAL DATA:  Initial evaluation for acute near syncope, fall. EXAM: MRI HEAD WITHOUT CONTRAST TECHNIQUE: Multiplanar, multiecho pulse sequences of the brain and surrounding structures were obtained without intravenous contrast. COMPARISON:  Prior CT from 01/14/2016. FINDINGS: Age-related cerebral atrophy with mild chronic small vessel ischemic disease present. Small remote left cerebellar infarct noted. No abnormal foci of restricted diffusion to suggest acute infarct. Major intracranial vascular flow voids are maintained. Gray-white matter differentiation preserved. No acute or chronic intracranial hemorrhage. No mass lesion, midline shift, or mass effect. No hydrocephalus. No extra-axial  fluid collection. Major dural sinuses are grossly patent. Craniocervical junction within normal limits. Incidental note made of an empty sella. No acute abnormality about the orbits. Increase CSF density along the optic nerve sheaths bilaterally. Remote posttraumatic  defect at the left lamina papyracea. Sequela prior bilateral lens extraction noted. Chronic right maxillary sinus disease noted. Mild mucosal thickening within the left sphenoid sinus. Paranasal sinuses are otherwise clear. No significant mastoid effusion. Inner ear structures normal. Fatty atrophy of the parotid glands noted. Bone marrow signal intensity within normal limits. Left frontal scalp contusion noted. IMPRESSION: 1. No acute intracranial process. 2. Left frontal scalp contusion. 3. Small remote left cerebellar infarct. 4. Empty sella with increased CSF fluid along the optic nerve sheaths. Findings are nonspecific, and often incidental, but can be seen in the setting of elevated intracranial pressures. 5. Age-related cerebral atrophy with mild chronic small vessel ischemic disease. 6. Chronic right maxillary sinus disease. Electronically Signed   By: Jeannine Boga M.D.   On: 01/15/2016 00:29   Mr Cervical Spine Wo Contrast  01/15/2016  CLINICAL DATA:  Initial evaluation for acute trauma, fall. EXAM: MRI CERVICAL SPINE WITHOUT CONTRAST TECHNIQUE: Multiplanar, multisequence MR imaging of the cervical spine was performed. No intravenous contrast was administered. COMPARISON:  Prior CT from earlier the same day. FINDINGS: Visualized portions of the brain and posterior fossa are unremarkable. Craniocervical junction widely patent. Vertebral bodies are normally aligned with preservation of the normal cervical lordosis paravertebral body heights are well preserved. No fracture or malalignment. Degenerative thickening of the tectorial membrane noted. Degenerative geode at the anterior aspect of the dens. Signal intensity within the vertebral  body bone marrow is normal. No focal osseous lesions. No marrow edema. Signal intensity within the cervical spinal cord is normal. Paraspinous soft tissues within normal limits. No prevertebral edema. C2-C3: Mild bilateral uncovertebral spurring and facet arthrosis. No significant canal or foraminal narrowing. C3-C4: Mild bilateral uncovertebral hypertrophy. Left-sided facet arthrosis. Resultant mild to moderate left foraminal narrowing. No significant right foraminal or central canal stenosis. C4-C5: Mild bilateral uncovertebral hypertrophy and facet arthrosis. Resultant moderate right with mild left foraminal narrowing. No canal stenosis. C5-C6: Bilateral uncovertebral hypertrophy and facet arthrosis. Mild posterior disc bulge. Resultant mild to moderate bilateral foraminal stenosis. Bulging disc mildly flattens the ventral thecal sac without significant canal stenosis. C6-C7: Mild bilateral uncovertebral spurring, left greater than right. Broad-based posterior disc bulge mildly flattens the ventral thecal sac without significant stenosis. Resultant severe left with moderate right foraminal stenosis, left worse than right. C7-T1:  Mild bilateral uncovertebral hypertrophy without stenosis. Visualized upper thoracic spine within normal limits. No significant stenosis. IMPRESSION: 1. No acute traumatic injury within the cervical spine. 2. Relatively mild for age multilevel degenerative spondylolysis and facet arthrosis. Resultant multilevel foraminal narrowing as above, most severe at the C6-7 level were there is severe left and moderate right foraminal narrowing. No significant canal stenosis. Please see above report for a full description of these findings. Electronically Signed   By: Jeannine Boga M.D.   On: 01/15/2016 00:58   Dg Shoulder Left  01/15/2016  CLINICAL DATA:  82 year old male with acute left shoulder pain following fall 4 days ago. Initial encounter. EXAM: LEFT SHOULDER - 2+ VIEW COMPARISON:   None. FINDINGS: There is no evidence of acute fracture, subluxation or dislocation. High-riding humeral head is compatible with rotator cuff tear -likely chronic. No focal bony lesions are identified. IMPRESSION: Rotator cuff tear - almost certainly chronic. No evidence of acute bony abnormality. Electronically Signed   By: Margarette Canada M.D.   On: 01/15/2016 16:57   Ct Maxillofacial Wo Cm  01/14/2016  CLINICAL DATA:  PT FELL FLAT ON FACE THIS AFTERNOON LARGE HEMATOMAS LT SIDE OF CHEEK,  TEMPORAL, PARIETAL, FRONTAL PT COLLARED NECK PAIN MOSTLY ON LEFT EXAM: CT HEAD WITHOUT CONTRAST CT MAXILLOFACIAL WITHOUT CONTRAST CT CERVICAL SPINE WITHOUT CONTRAST TECHNIQUE: Multidetector CT imaging of the head, cervical spine, and maxillofacial structures were performed using the standard protocol without intravenous contrast. Multiplanar CT image reconstructions of the cervical spine and maxillofacial structures were also generated. COMPARISON:  Head CT, 05/26/2008 FINDINGS: CT HEAD FINDINGS Left frontal scalp hematoma.  No skull fracture. The ventricles are normal in size, for this patient's age, and normal in configuration. There are no parenchymal masses or mass effect. There is no evidence of an infarct. There are no extra-axial masses or abnormal fluid collections. There is no intracranial hemorrhage. CT MAXILLOFACIAL FINDINGS There are nondisplaced fractures at the base of the nasal bridge. These may not be acute, but this appears be a change from the prior CT. No other evidence of an acute fracture. There is an old left medial orbital wall fracture. Right maxillary sinuses chronically opacified as it was on the prior study. Mild mucosal thickening noted along several of the right ethmoid air cells. The mastoid air cells and middle ear cavities are clear. Status post bilateral cataract surgery. Globes and orbits are otherwise unremarkable. No soft tissue masses or adenopathy. No hematoma. There are degenerative changes of  both temporomandibular joints. CT CERVICAL SPINE FINDINGS No fracture.  No spondylolisthesis. Mild loss of disc height at C5-C6 and C6-C7. There are small endplate osteophytes at these levels. There is facet degenerative change most evident on the left at C2-C3 and C3-C4 and on the right at C4-C5. Soft tissues are unremarkable other than 12 mm left thyroid nodule and carotid vascular calcifications. Lung apices are clear. IMPRESSION: HEAD CT: No acute intracranial abnormalities. No skull fracture. Left frontal scalp hematoma. MAXILLOFACIAL CT: Nondisplaced fractures at the base of the nasal bridge. These may not be acute but should be considered acute if they correlate with the site of injury/tenderness. No other evidence of a fracture. CERVICAL CT:  No fracture or acute finding. Electronically Signed   By: Lajean Manes M.D.   On: 01/14/2016 19:01    CBC  Recent Labs Lab 01/14/16 1742 01/15/16 0032 01/16/16 0608  WBC 9.5 7.7 6.7  HGB 10.2* 10.7* 9.5*  HCT 31.5* 32.5* 29.8*  PLT 219 146* 213  MCV 95.5 95.6 94.9  MCH 30.9 31.5 30.3  MCHC 32.4 32.9 31.9  RDW 14.5 14.6 14.4  LYMPHSABS 1.0  --   --   MONOABS 0.7  --   --   EOSABS 0.0  --   --   BASOSABS 0.0  --   --     Chemistries   Recent Labs Lab 01/12/16 0853 01/14/16 1742 01/15/16 0032 01/16/16 0608  NA 135 137  --  136  K 5.1 4.5  --  4.5  CL 104 107  --  105  CO2 21 20*  --  24  GLUCOSE 148* 163*  --  99  BUN 47* 41*  --  37*  CREATININE 2.04* 1.87* 1.65* 1.64*  CALCIUM 8.6 8.4*  --  8.4*  AST 16  --   --   --   ALT 14  --   --   --   ALKPHOS 113  --   --   --   BILITOT 0.7  --   --   --    ------------------------------------------------------------------------------------------------------------------ estimated creatinine clearance is 44.3 mL/min (by C-G formula based on Cr of 1.64). ------------------------------------------------------------------------------------------------------------------  Recent Labs  01/15/16 0525  HGBA1C 6.8*   ------------------------------------------------------------------------------------------------------------------  Recent Labs  01/15/16 0525  CHOL 122  HDL 40*  LDLCALC 62  TRIG 99  CHOLHDL 3.1   ------------------------------------------------------------------------------------------------------------------ No results for input(s): TSH, T4TOTAL, T3FREE, THYROIDAB in the last 72 hours.  Invalid input(s): FREET3 ------------------------------------------------------------------------------------------------------------------ No results for input(s): VITAMINB12, FOLATE, FERRITIN, TIBC, IRON, RETICCTPCT in the last 72 hours.  Coagulation profile No results for input(s): INR, PROTIME in the last 168 hours.  No results for input(s): DDIMER in the last 72 hours.  Cardiac Enzymes  Recent Labs Lab 01/14/16 1742 01/15/16 0032  TROPONINI 0.03 0.03   ------------------------------------------------------------------------------------------------------------------ Invalid input(s): POCBNP   Recent Labs  01/15/16 0136 01/15/16 0757 01/15/16 1145 01/15/16 1715 01/15/16 2156 01/16/16 0808  GLUCAP 114* 125* 171* 113* 72 91     RAI,RIPUDEEP M.D. [facility] 01/16/2016, 11:19 AM  Pager: [phone] Between 7am to 7pm - call Pager - [phone]  After 7pm go to [email or web address] - password [email or web address]  Call night coverage person covering after 7pm

## 2016-01-16 NOTE — Progress Notes (Signed)
  Echocardiogram 2D Echocardiogram has been performed.  [name] [name] 01/16/2016, 3:42 PM

## 2016-01-17 ENCOUNTER — Other Ambulatory Visit (HOSPITAL_COMMUNITY): Payer: Medicare Other

## 2016-01-17 DIAGNOSIS — K922 Gastrointestinal hemorrhage, unspecified: Secondary | ICD-10-CM | POA: Diagnosis not present

## 2016-01-17 DIAGNOSIS — I2510 Atherosclerotic heart disease of native coronary artery without angina pectoris: Secondary | ICD-10-CM | POA: Diagnosis not present

## 2016-01-17 DIAGNOSIS — I10 Essential (primary) hypertension: Secondary | ICD-10-CM | POA: Diagnosis not present

## 2016-01-17 DIAGNOSIS — R55 Syncope and collapse: Secondary | ICD-10-CM | POA: Diagnosis not present

## 2016-01-17 LAB — BASIC METABOLIC PANEL
Anion gap: 11 (ref 5–15)
BUN: 39 mg/dL — ABNORMAL HIGH (ref 6–20)
CO2: 22 mmol/L (ref 22–32)
Calcium: 8.3 mg/dL — ABNORMAL LOW (ref 8.9–10.3)
Chloride: 102 mmol/L (ref 101–111)
Creatinine, Ser: 1.8 mg/dL — ABNORMAL HIGH (ref 0.61–1.24)
GFR calc Af Amer: 39 mL/min — ABNORMAL LOW (ref >60)
GFR calc non Af Amer: 34 mL/min — ABNORMAL LOW (ref >60)
Glucose, Bld: 71 mg/dL (ref 65–99)
Potassium: 4.5 mmol/L (ref 3.5–5.1)
Sodium: 135 mmol/L (ref 135–145)

## 2016-01-17 LAB — CBC
HCT: 28.6 % — ABNORMAL LOW (ref 39.0–52.0)
Hemoglobin: 9.4 g/dL — ABNORMAL LOW (ref 13.0–17.0)
MCH: 31.2 pg (ref 26.0–34.0)
MCHC: 32.9 g/dL (ref 30.0–36.0)
MCV: 95 fL (ref 78.0–100.0)
Platelets: 210 10*3/uL (ref 150–400)
RBC: 3.01 MIL/uL — ABNORMAL LOW (ref 4.22–5.81)
RDW: 14.4 % (ref 11.5–15.5)
WBC: 6.3 10*3/uL (ref 4.0–10.5)

## 2016-01-17 LAB — GLUCOSE, CAPILLARY
Glucose-Capillary: 69 mg/dL (ref 65–99)
Glucose-Capillary: 100 mg/dL — ABNORMAL HIGH (ref 65–99)
Glucose-Capillary: 150 mg/dL — ABNORMAL HIGH (ref 65–99)

## 2016-01-17 MED ORDER — TRAMADOL HCL 50 MG PO TABS
50.0000 mg | ORAL_TABLET | Freq: Four times a day (QID) | ORAL | Status: DC | PRN
  Administered 2016-01-17: 50 mg via ORAL
  Filled 2016-01-17: qty 1

## 2016-01-17 MED ORDER — ACETAMINOPHEN 500 MG PO TABS: 500.0000 mg | ORAL_TABLET | Freq: Three times a day (TID) | ORAL | Status: DC

## 2016-01-17 MED ORDER — ACETAMINOPHEN 325 MG PO TABS: 650.0000 mg | ORAL_TABLET | ORAL | Status: DC | PRN

## 2016-01-17 NOTE — Progress Notes (Addendum)
Pt says he has some pain in his left wrist. He has no pain medications ordered. Pt says he does not want any pain medication upon asked if he would like the MD paged for something for pain. Will continue to monitor.  Pt decided to take pain medicine. MD paged for instructions. Will continue to monitor.

## 2016-01-17 NOTE — Progress Notes (Signed)
[facility]                                                                              Patient Demographics  [name] [name], is a 82 y.o. male, DOB - 12-21-1932, HYI:[id number]  Admit date - 01/14/2016   Admitting Physician [name], MD  Outpatient Primary MD for the patient is [name], MD  LOS -   days    Chief Complaint  Patient presents with  . multiple falls        Brief HPI   [name] [name] is a 82 y.o. male with history of diastolic CHF, CAD, diabetes mellitus, chronic disease chronic anemia and ulcerative colitis and paroxysmal atrial fibrillation who was referred to the ER by patient's primary care physician as patient has been having recurrent falls and has bruised his face after his last fall. Patient also has sustained an injury to his right shoulder which he has followed up with his primary orthopedic surgeon last week. Patient was referred to the physical therapy but patient had a fall during the process. 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 - PT evaluation recommended skilled nursing facility - 2-D echo shows EF of 78-67%, grade 1 diastolic dysfunction   Active Problems: Bilateral arms pain, right shoulder pain, Left radial fracture -  X-ray of the forearm left showed acute mildly impacted fracture of the left radial head - both shoulder Xray showed chronic rotator cuff tear - Appreciate orthopedic recommendations, recommended sling for the right shoulder, however patient is not wearing, recommended patient to be at the sling for comfort and will help with the healing of his left wrist.     CAD in native artery- total LAD, s/p RI DES 06/19/14 - Currently  no chest pain, continue aspirin, beta blocker, statins  diastolic CHF, chronic - 2-D echo 50-55% with grade 2 diastolic dysfunction in 67/2094 - Negative balance of 3.3  L, continue home dose of Lasix  History of GI bleed - Follow CBC, currently at baseline  Chronic diabetes mellitus type 2, with renal complications - Place on sliding scale insulin while inpatient  Chronic kidney disease stage III - Currently creatinine function at baseline  Paroxysmal Atrial fibrillation - Currently normal sinus rhythm, continue amiodarone and beta blocker - CHADS vasc 5, not on anticoagulation secondary to GI bleed and falls   Code Status: DO NOT RESUSCITATE  Family Communication: Discussed in detail with the patient, all imaging results, lab results explained to the patient and sister at the bedside  Disposition Plan: Had a long discussion with patient and sister by the bedside. Unfortunate situation, patient is observation status and sister at the bedside reports that he is unable to take care of himself at home by himself. He has had multiple falls in the past, has left wrist fracture this admission from the fall. PT recommending skilled  nursing facility. Will have OT eval. Patient had a fall in his orthopedic surgeon's office prior to admission, was referred to physical therapy however he had a fall during the process. Family unable to provide 24/7 care.  Will DC home in am with [facility], I'm not sure will help tremendously if no supervision.   Time Spent in minutes   25 minutes  Procedures  MRI brain and C-spine  Consults   Orthopedics  DVT Prophylaxis  Lovenox   Medications  Scheduled Meds: . acetaminophen  500 mg Oral TID  . amiodarone  200 mg Oral Daily  . aspirin EC  81 mg Oral Daily  . calcium carbonate  1,250 mg Oral Q breakfast  . enoxaparin (LOVENOX) injection  40 mg Subcutaneous Daily  . famotidine  20 mg Oral BID  . ferrous sulfate  325 mg Oral BID WC  .  furosemide  20 mg Oral QODAY  . glimepiride  2 mg Oral BID WC  . insulin aspart  0-9 Units Subcutaneous TID WC  . linagliptin  5 mg Oral Daily  . pantoprazole  40 mg Oral Daily  . pioglitazone  30 mg Oral Q breakfast  . simvastatin  20 mg Oral Daily  . tamsulosin  0.4 mg Oral QPC supper   Continuous Infusions:  PRN Meds:.   Antibiotics   Anti-infectives    None        Subjective:   [name] [name] was seen and examined today. Still continues to complain of left wrist pain, pain in the shoulders. Sister at the bedside.  Patient denies dizziness, chest pain, shortness of breath, abdominal pain, N/V/D/C, new weakness, numbess, tingling. No acute events overnight.    Objective:   Filed Vitals:   01/16/16 0652 01/16/16 1437 01/16/16 2106 01/17/16 0557  BP: 98/54 110/43 102/46 109/40  Pulse: 73 65 70 64  Temp: 97.7 F (36.5 C) 98 F (36.7 C) 97.9 F (36.6 C) 97.5 F (36.4 C)  TempSrc: Oral Oral Oral Oral  Resp: 18 16 20 16   Height:      Weight: 108.5 kg (239 lb 3.2 oz)   108.41 kg (239 lb)  SpO2: 98% 99% 99% 98%    Intake/Output Summary (Last 24 hours) at 01/17/16 1252 Last data filed at 01/17/16 7026  Gross per 24 hour  Intake    360 ml  Output   1975 ml  Net  -1615 ml     Wt Readings from Last 3 Encounters:  01/17/16 108.41 kg (239 lb)  01/12/16 112.492 kg (248 lb)  07/08/15 115.722 kg (255 lb 1.9 oz)     Exam  General: Alert and oriented x 3, NAD  HEENT:  Bruising on the face   Neck:   CVS: S1 S2clear, RRR  Respiratory: CTAB  Abdomen: Soft, nontender, nondistended, + bowel sounds, +Ostomy   Ext: no cyanosis clubbing or edema, Right shoulder pain, left forearm pain, swelling left wrist   Neuro:  Skin: No rashes  Psych: Normal affect and demeanor, alert and oriented x3    Data Reviewed:  I have personally reviewed following labs and imaging studies  Micro Results No results found for this or any previous visit (from the past 240  hour(s)).  Radiology Reports Dg Chest 2 View  01/14/2016  CLINICAL DATA:  Weakness EXAM: CHEST  2 VIEW COMPARISON:  10/22/2014 chest radiograph. FINDINGS: Stable cardiomediastinal silhouette with mild cardiomegaly. No pneumothorax. No pleural effusion. Mild pulmonary edema. Mild bibasilar atelectasis. IMPRESSION: 1. Mild congestive  heart failure. 2. Mild bibasilar atelectasis. Electronically Signed   By: Ilona Sorrel M.D.   On: 01/14/2016 18:06   Dg Pelvis 1-2 Views  01/14/2016  CLINICAL DATA:  Status post fall, with bilateral leg weakness. Initial encounter. EXAM: PELVIS - 1-2 VIEW COMPARISON:  CT of the abdomen and pelvis from 10/22/2014 FINDINGS: There is no evidence of fracture or dislocation. Both femoral heads are seated normally within their respective acetabula. Mild degenerative change is noted at the lower lumbar spine. The sacroiliac joints are unremarkable in appearance. The visualized bowel gas pattern is grossly unremarkable in appearance. Scattered clips and phleboliths are noted within the pelvis. IMPRESSION: No evidence of fracture or dislocation. Electronically Signed   By: Garald Balding M.D.   On: 01/14/2016 23:12   Dg Shoulder Right  01/15/2016  CLINICAL DATA:  Status post fall 5 days ago with bruising over both shoulders EXAM: RIGHT SHOULDER - 2+ VIEW COMPARISON:  Chest x-ray of October 22, 2014 which included a portion of the right shoulder. FINDINGS: The bones are subjectively osteopenic. There is marked narrowing of the subacromial subdeltoid space with a bone on bone appearance. The glenohumeral joint space is reasonably well maintained though the humeral head is superiorly positioned. The [facility] joint exhibits mild degenerative change. No acute fracture nor dislocation is observed. The observed portions of the right clavicle and upper right ribs are normal. IMPRESSION: No acute bony abnormality of the right shoulder is observed. There is marked narrowing of the subacromial subdeltoid  space which likely reflects significant rotator cuff pathology. Electronically Signed   By: David  Martinique M.D.   On: 01/15/2016 16:53   Dg Forearm Left  01/15/2016  CLINICAL DATA:  Status post fall 4 days ago with bruising of the anterior left forearm. EXAM: LEFT FOREARM - 2 VIEW COMPARISON:  None in PACs FINDINGS: AP and lateral views of the left forearm reveal the radius and ulna to be subjectively osteopenic. There is a mildly impacted fracture of the radial head. The adjacent olecranon appears intact. Similarly limited visualization of the wrist reveals no fracture or other acute bony abnormality. The soft tissues are unremarkable. IMPRESSION: There is an acute mildly impacted fracture of the left radial head. No acute fracture is observed elsewhere. Electronically Signed   By: David  Martinique M.D.   On: 01/15/2016 16:57   Ct Head Wo Contrast  01/14/2016  CLINICAL DATA:  PT FELL FLAT ON FACE THIS AFTERNOON LARGE HEMATOMAS LT SIDE OF CHEEK, TEMPORAL, PARIETAL, FRONTAL PT COLLARED NECK PAIN MOSTLY ON LEFT EXAM: CT HEAD WITHOUT CONTRAST CT MAXILLOFACIAL WITHOUT CONTRAST CT CERVICAL SPINE WITHOUT CONTRAST TECHNIQUE: Multidetector CT imaging of the head, cervical spine, and maxillofacial structures were performed using the standard protocol without intravenous contrast. Multiplanar CT image reconstructions of the cervical spine and maxillofacial structures were also generated. COMPARISON:  Head CT, 05/26/2008 FINDINGS: CT HEAD FINDINGS Left frontal scalp hematoma.  No skull fracture. The ventricles are normal in size, for this patient's age, and normal in configuration. There are no parenchymal masses or mass effect. There is no evidence of an infarct. There are no extra-axial masses or abnormal fluid collections. There is no intracranial hemorrhage. CT MAXILLOFACIAL FINDINGS There are nondisplaced fractures at the base of the nasal bridge. These may not be acute, but this appears be a change from the prior CT.  No other evidence of an acute fracture. There is an old left medial orbital wall fracture. Right maxillary sinuses chronically opacified as it was on  the prior study. Mild mucosal thickening noted along several of the right ethmoid air cells. The mastoid air cells and middle ear cavities are clear. Status post bilateral cataract surgery. Globes and orbits are otherwise unremarkable. No soft tissue masses or adenopathy. No hematoma. There are degenerative changes of both temporomandibular joints. CT CERVICAL SPINE FINDINGS No fracture.  No spondylolisthesis. Mild loss of disc height at C5-C6 and C6-C7. There are small endplate osteophytes at these levels. There is facet degenerative change most evident on the left at C2-C3 and C3-C4 and on the right at C4-C5. Soft tissues are unremarkable other than 12 mm left thyroid nodule and carotid vascular calcifications. Lung apices are clear. IMPRESSION: HEAD CT: No acute intracranial abnormalities. No skull fracture. Left frontal scalp hematoma. MAXILLOFACIAL CT: Nondisplaced fractures at the base of the nasal bridge. These may not be acute but should be considered acute if they correlate with the site of injury/tenderness. No other evidence of a fracture. CERVICAL CT:  No fracture or acute finding. Electronically Signed   By: Lajean Manes M.D.   On: 01/14/2016 19:01   Ct Cervical Spine Wo Contrast  01/14/2016  CLINICAL DATA:  PT FELL FLAT ON FACE THIS AFTERNOON LARGE HEMATOMAS LT SIDE OF CHEEK, TEMPORAL, PARIETAL, FRONTAL PT COLLARED NECK PAIN MOSTLY ON LEFT EXAM: CT HEAD WITHOUT CONTRAST CT MAXILLOFACIAL WITHOUT CONTRAST CT CERVICAL SPINE WITHOUT CONTRAST TECHNIQUE: Multidetector CT imaging of the head, cervical spine, and maxillofacial structures were performed using the standard protocol without intravenous contrast. Multiplanar CT image reconstructions of the cervical spine and maxillofacial structures were also generated. COMPARISON:  Head CT, 05/26/2008 FINDINGS:  CT HEAD FINDINGS Left frontal scalp hematoma.  No skull fracture. The ventricles are normal in size, for this patient's age, and normal in configuration. There are no parenchymal masses or mass effect. There is no evidence of an infarct. There are no extra-axial masses or abnormal fluid collections. There is no intracranial hemorrhage. CT MAXILLOFACIAL FINDINGS There are nondisplaced fractures at the base of the nasal bridge. These may not be acute, but this appears be a change from the prior CT. No other evidence of an acute fracture. There is an old left medial orbital wall fracture. Right maxillary sinuses chronically opacified as it was on the prior study. Mild mucosal thickening noted along several of the right ethmoid air cells. The mastoid air cells and middle ear cavities are clear. Status post bilateral cataract surgery. Globes and orbits are otherwise unremarkable. No soft tissue masses or adenopathy. No hematoma. There are degenerative changes of both temporomandibular joints. CT CERVICAL SPINE FINDINGS No fracture.  No spondylolisthesis. Mild loss of disc height at C5-C6 and C6-C7. There are small endplate osteophytes at these levels. There is facet degenerative change most evident on the left at C2-C3 and C3-C4 and on the right at C4-C5. Soft tissues are unremarkable other than 12 mm left thyroid nodule and carotid vascular calcifications. Lung apices are clear. IMPRESSION: HEAD CT: No acute intracranial abnormalities. No skull fracture. Left frontal scalp hematoma. MAXILLOFACIAL CT: Nondisplaced fractures at the base of the nasal bridge. These may not be acute but should be considered acute if they correlate with the site of injury/tenderness. No other evidence of a fracture. CERVICAL CT:  No fracture or acute finding. Electronically Signed   By: Lajean Manes M.D.   On: 01/14/2016 19:01   Mr Brain Wo Contrast  01/15/2016  CLINICAL DATA:  Initial evaluation for acute near syncope, fall. EXAM: MRI  HEAD WITHOUT  CONTRAST TECHNIQUE: Multiplanar, multiecho pulse sequences of the brain and surrounding structures were obtained without intravenous contrast. COMPARISON:  Prior CT from 01/14/2016. FINDINGS: Age-related cerebral atrophy with mild chronic small vessel ischemic disease present. Small remote left cerebellar infarct noted. No abnormal foci of restricted diffusion to suggest acute infarct. Major intracranial vascular flow voids are maintained. Gray-white matter differentiation preserved. No acute or chronic intracranial hemorrhage. No mass lesion, midline shift, or mass effect. No hydrocephalus. No extra-axial fluid collection. Major dural sinuses are grossly patent. Craniocervical junction within normal limits. Incidental note made of an empty sella. No acute abnormality about the orbits. Increase CSF density along the optic nerve sheaths bilaterally. Remote posttraumatic defect at the left lamina papyracea. Sequela prior bilateral lens extraction noted. Chronic right maxillary sinus disease noted. Mild mucosal thickening within the left sphenoid sinus. Paranasal sinuses are otherwise clear. No significant mastoid effusion. Inner ear structures normal. Fatty atrophy of the parotid glands noted. Bone marrow signal intensity within normal limits. Left frontal scalp contusion noted. IMPRESSION: 1. No acute intracranial process. 2. Left frontal scalp contusion. 3. Small remote left cerebellar infarct. 4. Empty sella with increased CSF fluid along the optic nerve sheaths. Findings are nonspecific, and often incidental, but can be seen in the setting of elevated intracranial pressures. 5. Age-related cerebral atrophy with mild chronic small vessel ischemic disease. 6. Chronic right maxillary sinus disease. Electronically Signed   By: Jeannine Boga M.D.   On: 01/15/2016 00:29   Mr Cervical Spine Wo Contrast  01/15/2016  CLINICAL DATA:  Initial evaluation for acute trauma, fall. EXAM: MRI CERVICAL SPINE  WITHOUT CONTRAST TECHNIQUE: Multiplanar, multisequence MR imaging of the cervical spine was performed. No intravenous contrast was administered. COMPARISON:  Prior CT from earlier the same day. FINDINGS: Visualized portions of the brain and posterior fossa are unremarkable. Craniocervical junction widely patent. Vertebral bodies are normally aligned with preservation of the normal cervical lordosis paravertebral body heights are well preserved. No fracture or malalignment. Degenerative thickening of the tectorial membrane noted. Degenerative geode at the anterior aspect of the dens. Signal intensity within the vertebral body bone marrow is normal. No focal osseous lesions. No marrow edema. Signal intensity within the cervical spinal cord is normal. Paraspinous soft tissues within normal limits. No prevertebral edema. C2-C3: Mild bilateral uncovertebral spurring and facet arthrosis. No significant canal or foraminal narrowing. C3-C4: Mild bilateral uncovertebral hypertrophy. Left-sided facet arthrosis. Resultant mild to moderate left foraminal narrowing. No significant right foraminal or central canal stenosis. C4-C5: Mild bilateral uncovertebral hypertrophy and facet arthrosis. Resultant moderate right with mild left foraminal narrowing. No canal stenosis. C5-C6: Bilateral uncovertebral hypertrophy and facet arthrosis. Mild posterior disc bulge. Resultant mild to moderate bilateral foraminal stenosis. Bulging disc mildly flattens the ventral thecal sac without significant canal stenosis. C6-C7: Mild bilateral uncovertebral spurring, left greater than right. Broad-based posterior disc bulge mildly flattens the ventral thecal sac without significant stenosis. Resultant severe left with moderate right foraminal stenosis, left worse than right. C7-T1:  Mild bilateral uncovertebral hypertrophy without stenosis. Visualized upper thoracic spine within normal limits. No significant stenosis. IMPRESSION: 1. No acute  traumatic injury within the cervical spine. 2. Relatively mild for age multilevel degenerative spondylolysis and facet arthrosis. Resultant multilevel foraminal narrowing as above, most severe at the C6-7 level were there is severe left and moderate right foraminal narrowing. No significant canal stenosis. Please see above report for a full description of these findings. Electronically Signed   By: Jeannine Boga M.D.   On:  01/15/2016 00:58   Dg Shoulder Left  01/15/2016  CLINICAL DATA:  82 year old male with acute left shoulder pain following fall 4 days ago. Initial encounter. EXAM: LEFT SHOULDER - 2+ VIEW COMPARISON:  None. FINDINGS: There is no evidence of acute fracture, subluxation or dislocation. High-riding humeral head is compatible with rotator cuff tear -likely chronic. No focal bony lesions are identified. IMPRESSION: Rotator cuff tear - almost certainly chronic. No evidence of acute bony abnormality. Electronically Signed   By: Margarette Canada M.D.   On: 01/15/2016 16:57   Ct Maxillofacial Wo Cm  01/14/2016  CLINICAL DATA:  PT FELL FLAT ON FACE THIS AFTERNOON LARGE HEMATOMAS LT SIDE OF CHEEK, TEMPORAL, PARIETAL, FRONTAL PT COLLARED NECK PAIN MOSTLY ON LEFT EXAM: CT HEAD WITHOUT CONTRAST CT MAXILLOFACIAL WITHOUT CONTRAST CT CERVICAL SPINE WITHOUT CONTRAST TECHNIQUE: Multidetector CT imaging of the head, cervical spine, and maxillofacial structures were performed using the standard protocol without intravenous contrast. Multiplanar CT image reconstructions of the cervical spine and maxillofacial structures were also generated. COMPARISON:  Head CT, 05/26/2008 FINDINGS: CT HEAD FINDINGS Left frontal scalp hematoma.  No skull fracture. The ventricles are normal in size, for this patient's age, and normal in configuration. There are no parenchymal masses or mass effect. There is no evidence of an infarct. There are no extra-axial masses or abnormal fluid collections. There is no intracranial  hemorrhage. CT MAXILLOFACIAL FINDINGS There are nondisplaced fractures at the base of the nasal bridge. These may not be acute, but this appears be a change from the prior CT. No other evidence of an acute fracture. There is an old left medial orbital wall fracture. Right maxillary sinuses chronically opacified as it was on the prior study. Mild mucosal thickening noted along several of the right ethmoid air cells. The mastoid air cells and middle ear cavities are clear. Status post bilateral cataract surgery. Globes and orbits are otherwise unremarkable. No soft tissue masses or adenopathy. No hematoma. There are degenerative changes of both temporomandibular joints. CT CERVICAL SPINE FINDINGS No fracture.  No spondylolisthesis. Mild loss of disc height at C5-C6 and C6-C7. There are small endplate osteophytes at these levels. There is facet degenerative change most evident on the left at C2-C3 and C3-C4 and on the right at C4-C5. Soft tissues are unremarkable other than 12 mm left thyroid nodule and carotid vascular calcifications. Lung apices are clear. IMPRESSION: HEAD CT: No acute intracranial abnormalities. No skull fracture. Left frontal scalp hematoma. MAXILLOFACIAL CT: Nondisplaced fractures at the base of the nasal bridge. These may not be acute but should be considered acute if they correlate with the site of injury/tenderness. No other evidence of a fracture. CERVICAL CT:  No fracture or acute finding. Electronically Signed   By: Lajean Manes M.D.   On: 01/14/2016 19:01    CBC  Recent Labs Lab 01/14/16 1742 01/15/16 0032 01/16/16 0608 01/17/16 0718  WBC 9.5 7.7 6.7 6.3  HGB 10.2* 10.7* 9.5* 9.4*  HCT 31.5* 32.5* 29.8* 28.6*  PLT 219 146* 213 210  MCV 95.5 95.6 94.9 95.0  MCH 30.9 31.5 30.3 31.2  MCHC 32.4 32.9 31.9 32.9  RDW 14.5 14.6 14.4 14.4  LYMPHSABS 1.0  --   --   --   MONOABS 0.7  --   --   --   EOSABS 0.0  --   --   --   BASOSABS 0.0  --   --   --     Chemistries    Recent Labs  Lab 01/12/16 0853 01/14/16 1742 01/15/16 0032 01/16/16 0608 01/17/16 0718  NA 135 137  --  136 135  K 5.1 4.5  --  4.5 4.5  CL 104 107  --  105 102  CO2 21 20*  --  24 22  GLUCOSE 148* 163*  --  99 71  BUN 47* 41*  --  37* 39*  CREATININE 2.04* 1.87* 1.65* 1.64* 1.80*  CALCIUM 8.6 8.4*  --  8.4* 8.3*  AST 16  --   --   --   --   ALT 14  --   --   --   --   ALKPHOS 113  --   --   --   --   BILITOT 0.7  --   --   --   --    ------------------------------------------------------------------------------------------------------------------ estimated creatinine clearance is 40.4 mL/min (by C-G formula based on Cr of 1.8). ------------------------------------------------------------------------------------------------------------------  Recent Labs  01/15/16 0525  HGBA1C 6.8*   ------------------------------------------------------------------------------------------------------------------  Recent Labs  01/15/16 0525  CHOL 122  HDL 40*  LDLCALC 62  TRIG 99  CHOLHDL 3.1   ------------------------------------------------------------------------------------------------------------------ No results for input(s): TSH, T4TOTAL, T3FREE, THYROIDAB in the last 72 hours.  Invalid input(s): FREET3 ------------------------------------------------------------------------------------------------------------------ No results for input(s): VITAMINB12, FOLATE, FERRITIN, TIBC, IRON, RETICCTPCT in the last 72 hours.  Coagulation profile No results for input(s): INR, PROTIME in the last 168 hours.  No results for input(s): DDIMER in the last 72 hours.  Cardiac Enzymes  Recent Labs Lab 01/14/16 1742 01/15/16 0032  TROPONINI 0.03 0.03   ------------------------------------------------------------------------------------------------------------------ Invalid input(s): POCBNP   Recent Labs  01/16/16 1158 01/16/16 1757 01/16/16 2103 01/17/16 0812 01/17/16 0906  01/17/16 1158  GLUCAP 132* 90 174* 69 100* 150*     Isay Perleberg M.D. [facility] 01/17/2016, 12:52 PM  Pager: [phone] Between 7am to 7pm - call Pager - [phone]  After 7pm go to [email or web address] - password [email or web address]  Call night coverage person covering after 7pm

## 2016-01-17 NOTE — Discharge Summary (Signed)
Physician Discharge Summary   Patient ID: [name] [name] MRN: [id number] DOB/AGE: Sep 30, 1933 82 y.o.  Admit date: 01/14/2016 Discharge date: 01/17/2016  Primary Care Physician:  [name], MD  Discharge Diagnoses:     . Near syncope   Multiple falls    Left wrist fracture    Bilateral rotator cuff  . Ulcerative colitis ([place]) . Paroxysmal atrial fibrillation ([name]) . Frequent PVCs . CKD (chronic kidney disease), stage III . CAD in native artery- total LAD, s/p RI DES 06/19/14 . Benign essential HTN . Bleeding gastrointestinal . Type 2 diabetes mellitus with renal complication [facility])  Consults:  Orthopedics, Dr Grandville Silos   Recommendations for Outpatient Follow-up:  1. Home health PT, OT, home health RN, home health aide, social work to be arranged by the case management. Patient will benefit from skilled nursing facility/nursing home due to his multiple falls and the lack of 24/7 supervision at home in near future  2. Please repeat CBC/BMET at next visit   DIET: Carb modified diet    Allergies:   Allergies  Allergen Reactions  . Brilinta [Ticagrelor]     Changed to Plavix due to SOB  . Penicillins Rash    Has patient had a PCN reaction causing immediate rash, facial/tongue/throat swelling, SOB or lightheadedness with hypotension:YES Has patient had a PCN reaction causing severe rash involving mucus membranes or skin necrosis: NO Has patient had a PCN reaction that required hospitalization No Has patient had a PCN reaction occurring within the last 10 years: NO If all of the above answers are "NO", then may proceed with Cephalosporin use.  . Sulfa Antibiotics Rash     DISCHARGE MEDICATIONS: Current Discharge Medication List    START taking these medications   Details  acetaminophen (TYLENOL) 500 MG tablet Take 1 tablet (500 mg total) by mouth 3 (three) times daily. Available over the counter Qty: 90 tablet, Refills: 0      CONTINUE these medications which have  NOT CHANGED   Details  amiodarone (PACERONE) 200 MG tablet Take 1 tablet (200 mg total) by mouth daily. Qty: 30 tablet, Refills: 10    aspirin 81 MG tablet Take 81 mg by mouth daily.    calcium carbonate (OS-CAL) 600 MG TABS tablet Take 600 mg by mouth daily with breakfast.    ferrous sulfate 325 (65 FE) MG tablet Take 1 tablet (325 mg total) by mouth 2 (two) times daily with a meal. Qty: 30 tablet, Refills: 3    furosemide (LASIX) 20 MG tablet Take 1 tablet (20 mg total) by mouth every other day. Qty: 30 tablet, Refills: 6    glimepiride (AMARYL) 2 MG tablet Take 2 mg by mouth 2 (two) times daily before a meal.    omeprazole (PRILOSEC) 20 MG capsule TAKE 1 CAPSULE (20 MG TOTAL) BY MOUTH DAILY. Qty: 30 capsule, Refills: 6    pioglitazone (ACTOS) 30 MG tablet Take 1 tablet (30 mg total) by mouth daily.    ranitidine (ZANTAC) 75 MG tablet Take 150 mg by mouth every morning. Patient unsure of exact dosage    saxagliptin HCl (ONGLYZA) 5 MG TABS tablet Take 1 tablet (5 mg total) by mouth daily. Qty: 90 tablet, Refills: 1    simvastatin (ZOCOR) 20 MG tablet Take 20 mg by mouth daily.    tamsulosin (FLOMAX) 0.4 MG CAPS capsule Take 1 capsule (0.4 mg total) by mouth daily after supper. Qty: 30 capsule, Refills: 3    metoprolol tartrate (LOPRESSOR) 25 MG tablet Take 0.5  tablets (12.5 mg total) by mouth 2 (two) times daily. Qty: 30 tablet, Refills: 5         Brief H and P: For complete details please refer to admission H and P, but in brief [name] [name] is a 82 y.o. male with history of diastolic CHF, CAD, diabetes mellitus, chronic disease chronic anemia and ulcerative colitis and paroxysmal atrial fibrillation who was referred to the ER by patient's primary care physician as patient has been having recurrent falls and has bruised his face after his last fall. Patient also has sustained an injury to his right shoulder which he has followed up with his primary orthopedic surgeon last  week. Patient was referred to the physical therapy but patient had a fall during the process. 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 - PT evaluation recommended skilled nursing facility-however patient could not qualify for skilled nursing facility due to a observation status. Case management was consulted and home health PT, OT, home health nurse, HHA, social work evaluation was placed. - 2-D echo shows EF of 27-51%, grade 1 diastolic dysfunction . Continue Lasix however not a candidate for ACE/ARB due to renal insufficiency  Bilateral arms pain, right shoulder pain, Left radial fracture - X-ray of the forearm left showed acute mildly impacted fracture of the left radial head - both shoulder Xray showed chronic rotator cuff tear - Appreciate orthopedic recommendations, recommended sling for the right shoulder, however patient is not wearing, recommended patient to be at the sling for comfort and will help with the healing of his left wrist. Outpatient follow-up with [facility].    CAD in native artery- total LAD, s/p RI DES 06/19/14 - Currently no chest pain, continue aspirin, beta blocker, statins  diastolic CHF, chronic - 2-D echo 40-45%, diffuse hypokinesis with grade 1 diastolic dysfunction - Negative balance of 3.3 L, continue home dose of Lasix  History of GI bleed - Follow CBC, currently at baseline  Chronic diabetes mellitus type 2, with renal complications - Place on sliding scale insulin while inpatient  Chronic kidney disease stage III - Currently creatinine function at baseline  Paroxysmal Atrial fibrillation - Currently normal sinus rhythm, continue amiodarone and beta blocker - CHADS vasc 5, not on  anticoagulation secondary to GI bleed and falls   Day of Discharge BP 109/40 mmHg  Pulse 64  Temp(Src) 97.5 F (36.4 C) (Oral)  Resp 16  Ht 5' 10"  (1.778 m)  Wt 108.41 kg (239 lb)  BMI 34.29 kg/m2  SpO2 98%  Physical Exam: General: Alert and awake oriented x3 not in any acute distress. HEENT: anicteric sclera, pupils reactive to light and accommodation CVS: S1-S2 clear no murmur rubs or gallops Chest: clear to auscultation bilaterally, no wheezing rales or rhonchi Abdomen: soft nontender, nondistended, normal bowel sounds Extremities: no cyanosis, clubbing or edema noted bilaterally Neuro: Cranial nerves II-XII intact, no focal neurological deficits   The results of significant diagnostics from this hospitalization (including imaging, microbiology, ancillary and laboratory) are listed below for reference.    LAB RESULTS: Basic Metabolic Panel:  Recent Labs Lab 01/16/16 0608 01/17/16 0718  NA 136 135  K 4.5 4.5  CL 105 102  CO2 24 22  GLUCOSE 99 71  BUN 37* 39*  CREATININE 1.64* 1.80*  CALCIUM 8.4* 8.3*   Liver Function Tests:  Recent Labs Lab 01/12/16 0853  AST 16  ALT 14  ALKPHOS 113  BILITOT 0.7  PROT 6.1  ALBUMIN 3.2*   No results for input(s): LIPASE, AMYLASE in the last 168 hours. No results for input(s): AMMONIA in the last 168 hours. CBC:  Recent Labs Lab 01/14/16 1742  01/16/16 0608 01/17/16 0718  WBC 9.5  < > 6.7 6.3  NEUTROABS 7.8*  --   --   --   HGB 10.2*  < > 9.5* 9.4*  HCT 31.5*  < > 29.8* 28.6*  MCV 95.5  < > 94.9 95.0  PLT 219  < > 213 210  < > = values in this interval not displayed. Cardiac Enzymes:  Recent Labs Lab 01/14/16 1742 01/15/16 0032  TROPONINI 0.03 0.03   BNP: Invalid input(s): POCBNP CBG:  Recent Labs Lab 01/17/16 0906 01/17/16 1158  GLUCAP 100* 150*    Significant Diagnostic Studies:  Dg Chest 2 View  01/14/2016  CLINICAL DATA:  Weakness EXAM: CHEST  2 VIEW COMPARISON:  10/22/2014 chest  radiograph. FINDINGS: Stable cardiomediastinal silhouette with mild cardiomegaly. No pneumothorax. No pleural effusion. Mild pulmonary edema. Mild bibasilar atelectasis. IMPRESSION: 1. Mild congestive heart failure. 2. Mild bibasilar atelectasis. Electronically Signed   By: Ilona Sorrel M.D.   On: 01/14/2016 18:06   Dg Pelvis 1-2 Views  01/14/2016  CLINICAL DATA:  Status post fall, with bilateral leg weakness. Initial encounter. EXAM: PELVIS - 1-2 VIEW COMPARISON:  CT of the abdomen and pelvis from 10/22/2014 FINDINGS: There is no evidence of fracture or dislocation. Both femoral heads are seated normally within their respective acetabula. Mild degenerative change is noted at the lower lumbar spine. The sacroiliac joints are unremarkable in appearance. The visualized bowel gas pattern is grossly unremarkable in appearance. Scattered clips and phleboliths are noted within the pelvis. IMPRESSION: No evidence of fracture or dislocation. Electronically Signed   By: Garald Balding M.D.   On: 01/14/2016 23:12   Dg Shoulder Right  01/15/2016  CLINICAL DATA:  Status post fall 5 days ago with bruising over both shoulders EXAM: RIGHT SHOULDER - 2+ VIEW COMPARISON:  Chest x-ray of October 22, 2014 which included a portion of the right shoulder. FINDINGS: The bones are subjectively osteopenic. There is marked narrowing of the subacromial subdeltoid space with a bone on bone appearance. The glenohumeral joint space is reasonably well maintained though the humeral head is superiorly positioned. The [facility] joint exhibits mild degenerative change. No acute fracture nor dislocation is observed. The observed portions of the right clavicle and upper right ribs are normal. IMPRESSION: No acute bony abnormality of the right shoulder is observed. There is marked narrowing of the subacromial subdeltoid space which likely reflects significant rotator cuff pathology. Electronically Signed   By: David  Martinique M.D.   On: 01/15/2016 16:53    Dg Forearm Left  01/15/2016  CLINICAL DATA:  Status post fall 4 days ago with bruising of the anterior left forearm. EXAM: LEFT FOREARM - 2 VIEW COMPARISON:  None in PACs FINDINGS: AP and lateral views of the left forearm reveal the radius and ulna to be subjectively osteopenic. There is a mildly impacted fracture of the radial head. The adjacent olecranon appears intact. Similarly limited visualization of the wrist reveals no fracture or other acute bony abnormality. The soft tissues are unremarkable. IMPRESSION: There is an acute mildly impacted fracture of the left radial head. No acute  fracture is observed elsewhere. Electronically Signed   By: David  Martinique M.D.   On: 01/15/2016 16:57   Ct Head Wo Contrast  01/14/2016  CLINICAL DATA:  PT FELL FLAT ON FACE THIS AFTERNOON LARGE HEMATOMAS LT SIDE OF CHEEK, TEMPORAL, PARIETAL, FRONTAL PT COLLARED NECK PAIN MOSTLY ON LEFT EXAM: CT HEAD WITHOUT CONTRAST CT MAXILLOFACIAL WITHOUT CONTRAST CT CERVICAL SPINE WITHOUT CONTRAST TECHNIQUE: Multidetector CT imaging of the head, cervical spine, and maxillofacial structures were performed using the standard protocol without intravenous contrast. Multiplanar CT image reconstructions of the cervical spine and maxillofacial structures were also generated. COMPARISON:  Head CT, 05/26/2008 FINDINGS: CT HEAD FINDINGS Left frontal scalp hematoma.  No skull fracture. The ventricles are normal in size, for this patient's age, and normal in configuration. There are no parenchymal masses or mass effect. There is no evidence of an infarct. There are no extra-axial masses or abnormal fluid collections. There is no intracranial hemorrhage. CT MAXILLOFACIAL FINDINGS There are nondisplaced fractures at the base of the nasal bridge. These may not be acute, but this appears be a change from the prior CT. No other evidence of an acute fracture. There is an old left medial orbital wall fracture. Right maxillary sinuses chronically  opacified as it was on the prior study. Mild mucosal thickening noted along several of the right ethmoid air cells. The mastoid air cells and middle ear cavities are clear. Status post bilateral cataract surgery. Globes and orbits are otherwise unremarkable. No soft tissue masses or adenopathy. No hematoma. There are degenerative changes of both temporomandibular joints. CT CERVICAL SPINE FINDINGS No fracture.  No spondylolisthesis. Mild loss of disc height at C5-C6 and C6-C7. There are small endplate osteophytes at these levels. There is facet degenerative change most evident on the left at C2-C3 and C3-C4 and on the right at C4-C5. Soft tissues are unremarkable other than 12 mm left thyroid nodule and carotid vascular calcifications. Lung apices are clear. IMPRESSION: HEAD CT: No acute intracranial abnormalities. No skull fracture. Left frontal scalp hematoma. MAXILLOFACIAL CT: Nondisplaced fractures at the base of the nasal bridge. These may not be acute but should be considered acute if they correlate with the site of injury/tenderness. No other evidence of a fracture. CERVICAL CT:  No fracture or acute finding. Electronically Signed   By: Lajean Manes M.D.   On: 01/14/2016 19:01   Ct Cervical Spine Wo Contrast  01/14/2016  CLINICAL DATA:  PT FELL FLAT ON FACE THIS AFTERNOON LARGE HEMATOMAS LT SIDE OF CHEEK, TEMPORAL, PARIETAL, FRONTAL PT COLLARED NECK PAIN MOSTLY ON LEFT EXAM: CT HEAD WITHOUT CONTRAST CT MAXILLOFACIAL WITHOUT CONTRAST CT CERVICAL SPINE WITHOUT CONTRAST TECHNIQUE: Multidetector CT imaging of the head, cervical spine, and maxillofacial structures were performed using the standard protocol without intravenous contrast. Multiplanar CT image reconstructions of the cervical spine and maxillofacial structures were also generated. COMPARISON:  Head CT, 05/26/2008 FINDINGS: CT HEAD FINDINGS Left frontal scalp hematoma.  No skull fracture. The ventricles are normal in size, for this patient's age,  and normal in configuration. There are no parenchymal masses or mass effect. There is no evidence of an infarct. There are no extra-axial masses or abnormal fluid collections. There is no intracranial hemorrhage. CT MAXILLOFACIAL FINDINGS There are nondisplaced fractures at the base of the nasal bridge. These may not be acute, but this appears be a change from the prior CT. No other evidence of an acute fracture. There is an old left medial orbital wall fracture. Right maxillary sinuses chronically  opacified as it was on the prior study. Mild mucosal thickening noted along several of the right ethmoid air cells. The mastoid air cells and middle ear cavities are clear. Status post bilateral cataract surgery. Globes and orbits are otherwise unremarkable. No soft tissue masses or adenopathy. No hematoma. There are degenerative changes of both temporomandibular joints. CT CERVICAL SPINE FINDINGS No fracture.  No spondylolisthesis. Mild loss of disc height at C5-C6 and C6-C7. There are small endplate osteophytes at these levels. There is facet degenerative change most evident on the left at C2-C3 and C3-C4 and on the right at C4-C5. Soft tissues are unremarkable other than 12 mm left thyroid nodule and carotid vascular calcifications. Lung apices are clear. IMPRESSION: HEAD CT: No acute intracranial abnormalities. No skull fracture. Left frontal scalp hematoma. MAXILLOFACIAL CT: Nondisplaced fractures at the base of the nasal bridge. These may not be acute but should be considered acute if they correlate with the site of injury/tenderness. No other evidence of a fracture. CERVICAL CT:  No fracture or acute finding. Electronically Signed   By: Lajean Manes M.D.   On: 01/14/2016 19:01   Mr Brain Wo Contrast  01/15/2016  CLINICAL DATA:  Initial evaluation for acute near syncope, fall. EXAM: MRI HEAD WITHOUT CONTRAST TECHNIQUE: Multiplanar, multiecho pulse sequences of the brain and surrounding structures were obtained  without intravenous contrast. COMPARISON:  Prior CT from 01/14/2016. FINDINGS: Age-related cerebral atrophy with mild chronic small vessel ischemic disease present. Small remote left cerebellar infarct noted. No abnormal foci of restricted diffusion to suggest acute infarct. Major intracranial vascular flow voids are maintained. Gray-white matter differentiation preserved. No acute or chronic intracranial hemorrhage. No mass lesion, midline shift, or mass effect. No hydrocephalus. No extra-axial fluid collection. Major dural sinuses are grossly patent. Craniocervical junction within normal limits. Incidental note made of an empty sella. No acute abnormality about the orbits. Increase CSF density along the optic nerve sheaths bilaterally. Remote posttraumatic defect at the left lamina papyracea. Sequela prior bilateral lens extraction noted. Chronic right maxillary sinus disease noted. Mild mucosal thickening within the left sphenoid sinus. Paranasal sinuses are otherwise clear. No significant mastoid effusion. Inner ear structures normal. Fatty atrophy of the parotid glands noted. Bone marrow signal intensity within normal limits. Left frontal scalp contusion noted. IMPRESSION: 1. No acute intracranial process. 2. Left frontal scalp contusion. 3. Small remote left cerebellar infarct. 4. Empty sella with increased CSF fluid along the optic nerve sheaths. Findings are nonspecific, and often incidental, but can be seen in the setting of elevated intracranial pressures. 5. Age-related cerebral atrophy with mild chronic small vessel ischemic disease. 6. Chronic right maxillary sinus disease. Electronically Signed   By: Jeannine Boga M.D.   On: 01/15/2016 00:29   Mr Cervical Spine Wo Contrast  01/15/2016  CLINICAL DATA:  Initial evaluation for acute trauma, fall. EXAM: MRI CERVICAL SPINE WITHOUT CONTRAST TECHNIQUE: Multiplanar, multisequence MR imaging of the cervical spine was performed. No intravenous contrast  was administered. COMPARISON:  Prior CT from earlier the same day. FINDINGS: Visualized portions of the brain and posterior fossa are unremarkable. Craniocervical junction widely patent. Vertebral bodies are normally aligned with preservation of the normal cervical lordosis paravertebral body heights are well preserved. No fracture or malalignment. Degenerative thickening of the tectorial membrane noted. Degenerative geode at the anterior aspect of the dens. Signal intensity within the vertebral body bone marrow is normal. No focal osseous lesions. No marrow edema. Signal intensity within the cervical spinal cord is normal. Paraspinous  soft tissues within normal limits. No prevertebral edema. C2-C3: Mild bilateral uncovertebral spurring and facet arthrosis. No significant canal or foraminal narrowing. C3-C4: Mild bilateral uncovertebral hypertrophy. Left-sided facet arthrosis. Resultant mild to moderate left foraminal narrowing. No significant right foraminal or central canal stenosis. C4-C5: Mild bilateral uncovertebral hypertrophy and facet arthrosis. Resultant moderate right with mild left foraminal narrowing. No canal stenosis. C5-C6: Bilateral uncovertebral hypertrophy and facet arthrosis. Mild posterior disc bulge. Resultant mild to moderate bilateral foraminal stenosis. Bulging disc mildly flattens the ventral thecal sac without significant canal stenosis. C6-C7: Mild bilateral uncovertebral spurring, left greater than right. Broad-based posterior disc bulge mildly flattens the ventral thecal sac without significant stenosis. Resultant severe left with moderate right foraminal stenosis, left worse than right. C7-T1:  Mild bilateral uncovertebral hypertrophy without stenosis. Visualized upper thoracic spine within normal limits. No significant stenosis. IMPRESSION: 1. No acute traumatic injury within the cervical spine. 2. Relatively mild for age multilevel degenerative spondylolysis and facet arthrosis.  Resultant multilevel foraminal narrowing as above, most severe at the C6-7 level were there is severe left and moderate right foraminal narrowing. No significant canal stenosis. Please see above report for a full description of these findings. Electronically Signed   By: Jeannine Boga M.D.   On: 01/15/2016 00:58   Dg Shoulder Left  01/15/2016  CLINICAL DATA:  82 year old male with acute left shoulder pain following fall 4 days ago. Initial encounter. EXAM: LEFT SHOULDER - 2+ VIEW COMPARISON:  None. FINDINGS: There is no evidence of acute fracture, subluxation or dislocation. High-riding humeral head is compatible with rotator cuff tear -likely chronic. No focal bony lesions are identified. IMPRESSION: Rotator cuff tear - almost certainly chronic. No evidence of acute bony abnormality. Electronically Signed   By: Margarette Canada M.D.   On: 01/15/2016 16:57   Ct Maxillofacial Wo Cm  01/14/2016  CLINICAL DATA:  PT FELL FLAT ON FACE THIS AFTERNOON LARGE HEMATOMAS LT SIDE OF CHEEK, TEMPORAL, PARIETAL, FRONTAL PT COLLARED NECK PAIN MOSTLY ON LEFT EXAM: CT HEAD WITHOUT CONTRAST CT MAXILLOFACIAL WITHOUT CONTRAST CT CERVICAL SPINE WITHOUT CONTRAST TECHNIQUE: Multidetector CT imaging of the head, cervical spine, and maxillofacial structures were performed using the standard protocol without intravenous contrast. Multiplanar CT image reconstructions of the cervical spine and maxillofacial structures were also generated. COMPARISON:  Head CT, 05/26/2008 FINDINGS: CT HEAD FINDINGS Left frontal scalp hematoma.  No skull fracture. The ventricles are normal in size, for this patient's age, and normal in configuration. There are no parenchymal masses or mass effect. There is no evidence of an infarct. There are no extra-axial masses or abnormal fluid collections. There is no intracranial hemorrhage. CT MAXILLOFACIAL FINDINGS There are nondisplaced fractures at the base of the nasal bridge. These may not be acute, but this  appears be a change from the prior CT. No other evidence of an acute fracture. There is an old left medial orbital wall fracture. Right maxillary sinuses chronically opacified as it was on the prior study. Mild mucosal thickening noted along several of the right ethmoid air cells. The mastoid air cells and middle ear cavities are clear. Status post bilateral cataract surgery. Globes and orbits are otherwise unremarkable. No soft tissue masses or adenopathy. No hematoma. There are degenerative changes of both temporomandibular joints. CT CERVICAL SPINE FINDINGS No fracture.  No spondylolisthesis. Mild loss of disc height at C5-C6 and C6-C7. There are small endplate osteophytes at these levels. There is facet degenerative change most evident on the left at C2-C3 and C3-C4  and on the right at C4-C5. Soft tissues are unremarkable other than 12 mm left thyroid nodule and carotid vascular calcifications. Lung apices are clear. IMPRESSION: HEAD CT: No acute intracranial abnormalities. No skull fracture. Left frontal scalp hematoma. MAXILLOFACIAL CT: Nondisplaced fractures at the base of the nasal bridge. These may not be acute but should be considered acute if they correlate with the site of injury/tenderness. No other evidence of a fracture. CERVICAL CT:  No fracture or acute finding. Electronically Signed   By: Lajean Manes M.D.   On: 01/14/2016 19:01    2D ECHO: Study Conclusions  - Left ventricle: The cavity size was normal. Wall thickness was  normal. Systolic function was mildly to moderately reduced. The  estimated ejection fraction was in the range of 40% to 45%.  Diffuse hypokinesis. Doppler parameters are consistent with  abnormal left ventricular relaxation (grade 1 diastolic  dysfunction). Doppler parameters are consistent with high  ventricular filling pressure. - Aortic valve: There was trivial regurgitation. - Mitral valve: Calcified annulus. There was mild regurgitation. - Left  atrium: The atrium was moderately dilated. - Right atrium: The atrium was mildly dilated. - Atrial septum: There was an atrial septal aneurysm. - Pulmonary arteries: PA peak pressure: 32 mm Hg (S).  Impressions:  - Mild to moderate global reduction in LV function; grade 1  diastolic dysfunction with elevated LV filling pressure.;  moderate LAE; mild RAE; trace AI; mild MR; trace TR.  Disposition and Follow-up: Discharge Instructions    Diet Carb Modified    Complete by:  As directed      Increase activity slowly    Complete by:  As directed             DISPOSITION: Home with home health   DISCHARGE FOLLOW-UP Follow-up Information    Follow up with [facility]. Schedule an appointment as soon as possible for a visit in 2 weeks.   Contact information:   [address] [place] [phone]      Follow up with [name], MD. Schedule an appointment as soon as possible for a visit in 10 days.   Specialty:  Internal Medicine   Why:  for hospital follow-up   Contact information:   [address] [place] [address] [phone]        Time spent on Discharge: 26mns   Signed:   Jesten Cappuccio M.D. [facility] 01/17/2016, 1:16 PM Pager: [phone]

## 2016-01-17 NOTE — Progress Notes (Addendum)
CM received callback from [facility] to deliver the [place] however, CM received call from MD stating discharge is today.  CM confirmed with pt his choice for HHPT/OT/RN/AIde/SW is [facility].  Pt states he has a rolling walker and 3n1 at home.  CM notified [facility] to notify of discharge.  CM met with pt and asked if company needed to leave for [place] to discuss disposition. Pt states he wants visitor to stay; visitor is NOT POA.  CM asked pt if he was willing to pay out of pocket for SNF and pt states no, he cannot afford it.  Cm explained his insurance will not pay as he is OBSERVATION status. Both pt and visitor state they were told by MD status was changed to inpatient status; CM called MD who emphatically states this is NOT true.  CM explains to pt change of status was not discussed by MD and pt states, "it wasn't the MD, it was the Nurse."  CM  reminded pt, previous CM Deb Swist had delivered and explained his status, and no change of status has occurred. Pt has already requested [facility] for [facility] services.  Visitor states she prefers [place].  CM reminded visitor it is the pt's choice.  CM called MD who states pt will be discharged after all test results are back and that may not be until tomorrow.  CM will notify [facility] of need to deliver an ABN.

## 2016-01-18 NOTE — Telephone Encounter (Signed)
Instructed patient's DPR to STOP LASIX and to call if swelling returns. She was grateful for call. Med list updated.

## 2016-01-20 ENCOUNTER — Other Ambulatory Visit: Payer: Medicare Other

## 2016-01-21 DIAGNOSIS — S52102D Unspecified fracture of upper end of left radius, subsequent encounter for closed fracture with routine healing: Secondary | ICD-10-CM | POA: Diagnosis not present

## 2016-01-21 DIAGNOSIS — Z7984 Long term (current) use of oral hypoglycemic drugs: Secondary | ICD-10-CM | POA: Diagnosis not present

## 2016-01-21 DIAGNOSIS — E1122 Type 2 diabetes mellitus with diabetic chronic kidney disease: Secondary | ICD-10-CM | POA: Diagnosis not present

## 2016-01-21 DIAGNOSIS — S46002D Unspecified injury of muscle(s) and tendon(s) of the rotator cuff of left shoulder, subsequent encounter: Secondary | ICD-10-CM | POA: Diagnosis not present

## 2016-01-21 DIAGNOSIS — S46001D Unspecified injury of muscle(s) and tendon(s) of the rotator cuff of right shoulder, subsequent encounter: Secondary | ICD-10-CM | POA: Diagnosis not present

## 2016-01-25 DIAGNOSIS — S52102D Unspecified fracture of upper end of left radius, subsequent encounter for closed fracture with routine healing: Secondary | ICD-10-CM | POA: Diagnosis not present

## 2016-01-25 DIAGNOSIS — S46001D Unspecified injury of muscle(s) and tendon(s) of the rotator cuff of right shoulder, subsequent encounter: Secondary | ICD-10-CM | POA: Diagnosis not present

## 2016-01-26 DIAGNOSIS — S46001D Unspecified injury of muscle(s) and tendon(s) of the rotator cuff of right shoulder, subsequent encounter: Secondary | ICD-10-CM | POA: Diagnosis not present

## 2016-01-26 DIAGNOSIS — M25532 Pain in left wrist: Secondary | ICD-10-CM | POA: Diagnosis not present

## 2016-01-26 DIAGNOSIS — S52102D Unspecified fracture of upper end of left radius, subsequent encounter for closed fracture with routine healing: Secondary | ICD-10-CM | POA: Diagnosis not present

## 2016-01-27 DIAGNOSIS — M25539 Pain in unspecified wrist: Secondary | ICD-10-CM | POA: Diagnosis not present

## 2016-01-27 DIAGNOSIS — R269 Unspecified abnormalities of gait and mobility: Secondary | ICD-10-CM | POA: Diagnosis not present

## 2016-01-27 DIAGNOSIS — S0083XA Contusion of other part of head, initial encounter: Secondary | ICD-10-CM | POA: Diagnosis not present

## 2016-01-27 DIAGNOSIS — R55 Syncope and collapse: Secondary | ICD-10-CM | POA: Diagnosis not present

## 2016-01-28 DIAGNOSIS — S52102D Unspecified fracture of upper end of left radius, subsequent encounter for closed fracture with routine healing: Secondary | ICD-10-CM | POA: Diagnosis not present

## 2016-01-28 DIAGNOSIS — S46001D Unspecified injury of muscle(s) and tendon(s) of the rotator cuff of right shoulder, subsequent encounter: Secondary | ICD-10-CM | POA: Diagnosis not present

## 2016-01-30 ENCOUNTER — Other Ambulatory Visit: Payer: Self-pay | Admitting: Cardiology

## 2016-01-30 DIAGNOSIS — S46001D Unspecified injury of muscle(s) and tendon(s) of the rotator cuff of right shoulder, subsequent encounter: Secondary | ICD-10-CM | POA: Diagnosis not present

## 2016-01-30 DIAGNOSIS — S52102D Unspecified fracture of upper end of left radius, subsequent encounter for closed fracture with routine healing: Secondary | ICD-10-CM | POA: Diagnosis not present

## 2016-02-01 DIAGNOSIS — S46001D Unspecified injury of muscle(s) and tendon(s) of the rotator cuff of right shoulder, subsequent encounter: Secondary | ICD-10-CM | POA: Diagnosis not present

## 2016-02-01 DIAGNOSIS — S52102D Unspecified fracture of upper end of left radius, subsequent encounter for closed fracture with routine healing: Secondary | ICD-10-CM | POA: Diagnosis not present

## 2016-02-02 ENCOUNTER — Telehealth: Payer: Self-pay | Admitting: Cardiology

## 2016-02-02 NOTE — Telephone Encounter (Signed)
Informed patient that refill for amiodarone was sent in yesterday, but no new medications have been ordered.  Patient was grateful for call.

## 2016-02-02 NOTE — Telephone Encounter (Signed)
New message      Pt states his pharmacy called and stated there was a new presc for him to pick up.  He is not aware of a new presc being called in.  Please call and let him know what it is and what it is for

## 2016-02-03 DIAGNOSIS — S52102D Unspecified fracture of upper end of left radius, subsequent encounter for closed fracture with routine healing: Secondary | ICD-10-CM | POA: Diagnosis not present

## 2016-02-03 DIAGNOSIS — S46001D Unspecified injury of muscle(s) and tendon(s) of the rotator cuff of right shoulder, subsequent encounter: Secondary | ICD-10-CM | POA: Diagnosis not present

## 2016-02-04 DIAGNOSIS — S46001D Unspecified injury of muscle(s) and tendon(s) of the rotator cuff of right shoulder, subsequent encounter: Secondary | ICD-10-CM | POA: Diagnosis not present

## 2016-02-04 DIAGNOSIS — S52102D Unspecified fracture of upper end of left radius, subsequent encounter for closed fracture with routine healing: Secondary | ICD-10-CM | POA: Diagnosis not present

## 2016-02-05 DIAGNOSIS — S52102D Unspecified fracture of upper end of left radius, subsequent encounter for closed fracture with routine healing: Secondary | ICD-10-CM | POA: Diagnosis not present

## 2016-02-05 DIAGNOSIS — S46001D Unspecified injury of muscle(s) and tendon(s) of the rotator cuff of right shoulder, subsequent encounter: Secondary | ICD-10-CM | POA: Diagnosis not present

## 2016-02-07 ENCOUNTER — Encounter (HOSPITAL_COMMUNITY): Payer: Self-pay

## 2016-02-07 ENCOUNTER — Emergency Department (HOSPITAL_COMMUNITY): Payer: Medicare Other

## 2016-02-07 ENCOUNTER — Emergency Department (HOSPITAL_COMMUNITY)
Admission: EM | Admit: 2016-02-07 | Discharge: 2016-02-07 | Disposition: A | Discharge status: Home / Self Care | Payer: Medicare Other | Attending: Emergency Medicine | Admitting: Emergency Medicine

## 2016-02-07 DIAGNOSIS — I5032 Chronic diastolic (congestive) heart failure: Secondary | ICD-10-CM | POA: Insufficient documentation

## 2016-02-07 DIAGNOSIS — I129 Hypertensive chronic kidney disease with stage 1 through stage 4 chronic kidney disease, or unspecified chronic kidney disease: Secondary | ICD-10-CM | POA: Diagnosis not present

## 2016-02-07 DIAGNOSIS — M79642 Pain in left hand: Secondary | ICD-10-CM | POA: Diagnosis not present

## 2016-02-07 DIAGNOSIS — M19042 Primary osteoarthritis, left hand: Secondary | ICD-10-CM | POA: Diagnosis not present

## 2016-02-07 DIAGNOSIS — E669 Obesity, unspecified: Secondary | ICD-10-CM | POA: Insufficient documentation

## 2016-02-07 DIAGNOSIS — Z7984 Long term (current) use of oral hypoglycemic drugs: Secondary | ICD-10-CM | POA: Diagnosis not present

## 2016-02-07 DIAGNOSIS — Z7982 Long term (current) use of aspirin: Secondary | ICD-10-CM | POA: Insufficient documentation

## 2016-02-07 DIAGNOSIS — Z880 Allergy status to penicillin: Secondary | ICD-10-CM | POA: Insufficient documentation

## 2016-02-07 DIAGNOSIS — N183 Chronic kidney disease, stage 3 (moderate): Secondary | ICD-10-CM | POA: Diagnosis not present

## 2016-02-07 DIAGNOSIS — M7989 Other specified soft tissue disorders: Secondary | ICD-10-CM | POA: Diagnosis not present

## 2016-02-07 DIAGNOSIS — Z8619 Personal history of other infectious and parasitic diseases: Secondary | ICD-10-CM | POA: Diagnosis not present

## 2016-02-07 DIAGNOSIS — Z79899 Other long term (current) drug therapy: Secondary | ICD-10-CM | POA: Diagnosis not present

## 2016-02-07 DIAGNOSIS — Z8781 Personal history of (healed) traumatic fracture: Secondary | ICD-10-CM | POA: Insufficient documentation

## 2016-02-07 DIAGNOSIS — D649 Anemia, unspecified: Secondary | ICD-10-CM | POA: Diagnosis not present

## 2016-02-07 DIAGNOSIS — E785 Hyperlipidemia, unspecified: Secondary | ICD-10-CM | POA: Insufficient documentation

## 2016-02-07 DIAGNOSIS — E1122 Type 2 diabetes mellitus with diabetic chronic kidney disease: Secondary | ICD-10-CM | POA: Insufficient documentation

## 2016-02-07 DIAGNOSIS — M79609 Pain in unspecified limb: Secondary | ICD-10-CM | POA: Diagnosis not present

## 2016-02-07 DIAGNOSIS — K219 Gastro-esophageal reflux disease without esophagitis: Secondary | ICD-10-CM | POA: Insufficient documentation

## 2016-02-07 DIAGNOSIS — Z87442 Personal history of urinary calculi: Secondary | ICD-10-CM | POA: Insufficient documentation

## 2016-02-07 DIAGNOSIS — M1610 Unilateral primary osteoarthritis, unspecified hip: Secondary | ICD-10-CM | POA: Diagnosis not present

## 2016-02-07 DIAGNOSIS — I2510 Atherosclerotic heart disease of native coronary artery without angina pectoris: Secondary | ICD-10-CM | POA: Insufficient documentation

## 2016-02-07 DIAGNOSIS — Z8711 Personal history of peptic ulcer disease: Secondary | ICD-10-CM | POA: Diagnosis not present

## 2016-02-07 DIAGNOSIS — M8580 Other specified disorders of bone density and structure, unspecified site: Secondary | ICD-10-CM | POA: Insufficient documentation

## 2016-02-07 LAB — CBC WITH DIFFERENTIAL/PLATELET
Basophils Absolute: 0 10*3/uL (ref 0.0–0.1)
Basophils Relative: 0 %
Eosinophils Absolute: 0.1 10*3/uL (ref 0.0–0.7)
Eosinophils Relative: 1 %
HCT: 31.6 % — ABNORMAL LOW (ref 39.0–52.0)
Hemoglobin: 10.3 g/dL — ABNORMAL LOW (ref 13.0–17.0)
Lymphocytes Relative: 12 %
Lymphs Abs: 0.9 10*3/uL (ref 0.7–4.0)
MCH: 30.4 pg (ref 26.0–34.0)
MCHC: 32.6 g/dL (ref 30.0–36.0)
MCV: 93.2 fL (ref 78.0–100.0)
Monocytes Absolute: 0.6 10*3/uL (ref 0.1–1.0)
Monocytes Relative: 8 %
Neutro Abs: 6.2 10*3/uL (ref 1.7–7.7)
Neutrophils Relative %: 79 %
Platelets: 196 10*3/uL (ref 150–400)
RBC: 3.39 MIL/uL — ABNORMAL LOW (ref 4.22–5.81)
RDW: 14.5 % (ref 11.5–15.5)
WBC: 7.8 10*3/uL (ref 4.0–10.5)

## 2016-02-07 LAB — COMPREHENSIVE METABOLIC PANEL
ALT: 15 U/L — ABNORMAL LOW (ref 17–63)
AST: 16 U/L (ref 15–41)
Albumin: 2.7 g/dL — ABNORMAL LOW (ref 3.5–5.0)
Alkaline Phosphatase: 135 U/L — ABNORMAL HIGH (ref 38–126)
Anion gap: 8 (ref 5–15)
BUN: 43 mg/dL — ABNORMAL HIGH (ref 6–20)
CO2: 20 mmol/L — ABNORMAL LOW (ref 22–32)
Calcium: 8.6 mg/dL — ABNORMAL LOW (ref 8.9–10.3)
Chloride: 109 mmol/L (ref 101–111)
Creatinine, Ser: 1.67 mg/dL — ABNORMAL HIGH (ref 0.61–1.24)
GFR calc Af Amer: 43 mL/min — ABNORMAL LOW (ref >60)
GFR calc non Af Amer: 37 mL/min — ABNORMAL LOW (ref >60)
Glucose, Bld: 126 mg/dL — ABNORMAL HIGH (ref 65–99)
Potassium: 4.6 mmol/L (ref 3.5–5.1)
Sodium: 137 mmol/L (ref 135–145)
Total Bilirubin: 0.4 mg/dL (ref 0.3–1.2)
Total Protein: 6.1 g/dL — ABNORMAL LOW (ref 6.5–8.1)

## 2016-02-07 MED ORDER — HYDROCODONE-ACETAMINOPHEN 5-325 MG PO TABS: 1.0000 | ORAL_TABLET | ORAL | Status: DC | PRN

## 2016-02-07 NOTE — Discharge Instructions (Signed)

## 2016-02-07 NOTE — ED Provider Notes (Signed)
CSN: [id number]     Arrival date & time 02/07/16  1337 History   First MD Initiated Contact with Patient 02/07/16 1342     Chief Complaint  Patient presents with  . Joint Swelling  . Fall  PT was admitted to the hospital at [facility] on 3/30 for syncope with a left wrist fx.  The pt said that he was supposed to go to a snf, but the admission was put in under observation and never changed to inpatient, so medicare would  Not approve the admission.  The pt was d/c'd with home health and pt.  The pt is here today because of increased pain and swelling to left hand.    (Consider location/radiation/quality/duration/timing/severity/associated sxs/prior Treatment) The history is provided by the patient.    Past Medical History  Diagnosis Date  . Diabetes ([place])   . Obesity   . Ulcerative colitis ([place])   . HTN (hypertension)   . Dyslipidemia   . GERD (gastroesophageal reflux disease)   . History of peptic ulcer disease   . Ulcerative colitis ([place])     a. s/p total colectomy remotely.  . Anemia   . Macular degeneration   . Kidney stone may 2009    Right hydronephrosis, S/P stone removal  . Small bowel obstruction, partial ([place]) 2009    Episode  . Shingles     episode  . Compression fracture of L1 lumbar vertebra ([name])   . Osteopenia   . Lower back pain   . OA (osteoarthritis) of hip   . CKD (chronic kidney disease), stage III   . Hx of acute renal failure 01/02/10-3/24-11    due to hypovolemic shock, gastroenteritis and dehydration,hospitalized . Did requre a few days of dialysis. Cr at discharge was 1.8  . PAF (paroxysmal atrial fibrillation) ([name])     not on long term anticoagulation due to history of heme positive stools and anemia  . Frequent PVCs   . CAD (coronary artery disease) 06/2014    a. 06/2014: abnl nuc. Cath: Totally occluded LAD with faint collaterals (not a candidate for CTO PCI), 90% ramus s/p DES, 50-70% OM2.  . Complication of anesthesia     " during my kidney  stone surgery my heart went out of rhythm  . History of blood transfusion   . Atrial flutter ([place])     a. s/p RFCA of counterclockwise cavo-tricuspid isthmus dependent atrial flutter 2009 by Dr. Rayann Heman.  . Chronic diastolic CHF (congestive heart failure) ([place])     a. 2D Echo 10/23/13: EF 50-55%, basal mid-inf HK, grade 2 DD, mild MR, mod dilated LA, no sig change from prior.  . Kidney disease    Past Surgical History  Procedure Laterality Date  . Back surgery    . Colon resection    . Hernia repair    . Tonsillectomy    . Cardiac catheterization  06/19/2014  . Coronary angioplasty    . Coronary stent placement  06/19/2014    DES       dr Martinique  . Left and right heart catheterization with coronary angiogram N/A 06/19/2014    Procedure: LEFT AND RIGHT HEART CATHETERIZATION WITH CORONARY ANGIOGRAM;  Surgeon: Peter M Martinique, MD;  Location: [facility] CATH LAB;  Service: Cardiovascular;  Laterality: N/A;   Family History  Problem Relation Age of Onset  . Colon cancer Mother    Social History  Substance Use Topics  . Smoking status: Never Smoker   . Smokeless tobacco: Never  Used  . Alcohol Use: No    Review of Systems  Musculoskeletal: Positive for joint swelling.  All other systems reviewed and are negative.     Allergies  Brilinta; Penicillins; and Sulfa antibiotics  Home Medications   Prior to Admission medications   Medication Sig Start Date End Date Taking? Authorizing Provider  amiodarone (PACERONE) 200 MG tablet TAKE 1 TABLET (200 MG TOTAL) BY MOUTH DAILY. 02/01/16  Yes Sueanne Margarita, MD  aspirin 81 MG tablet Take 81 mg by mouth daily.   Yes Historical Provider, MD  calcium carbonate (OS-CAL) 600 MG TABS tablet Take 600 mg by mouth daily with breakfast.   Yes Historical Provider, MD  cholecalciferol (VITAMIN D) 1000 units tablet Take 3,000 Units by mouth daily.   Yes Historical Provider, MD  ferrous sulfate 325 (65 FE) MG tablet Take 1 tablet (325 mg total) by mouth 2 (two)  times daily with a meal. 10/27/14  Yes Florencia Reasons, MD  omeprazole (PRILOSEC) 20 MG capsule TAKE 1 CAPSULE (20 MG TOTAL) BY MOUTH DAILY. 12/01/15  Yes Irene Shipper, MD  pioglitazone (ACTOS) 30 MG tablet Take 1 tablet (30 mg total) by mouth daily. 11/05/14  Yes Dayna N Dunn, PA-C  ranitidine (ZANTAC) 150 MG tablet Take 150 mg by mouth every morning.   Yes Historical Provider, MD  saxagliptin HCl (ONGLYZA) 5 MG TABS tablet Take 1 tablet (5 mg total) by mouth daily. 06/20/14  Yes Tanda Rockers, MD  simvastatin (ZOCOR) 20 MG tablet Take 20 mg by mouth daily.   Yes Historical Provider, MD  tamsulosin (FLOMAX) 0.4 MG CAPS capsule Take 1 capsule (0.4 mg total) by mouth daily after supper. 10/27/14  Yes Florencia Reasons, MD  acetaminophen (TYLENOL) 500 MG tablet Take 1 tablet (500 mg total) by mouth 3 (three) times daily. Available over the counter 01/17/16   Ripudeep K Rai, MD  metoprolol tartrate (LOPRESSOR) 25 MG tablet Take 0.5 tablets (12.5 mg total) by mouth 2 (two) times daily. Patient not taking: Reported on 01/14/2016 12/26/14   Sueanne Margarita, MD   BP 147/66 mmHg  Pulse 74  Temp(Src) 97.6 F (36.4 C) (Oral)  Resp 18  SpO2 100% Physical Exam  Constitutional: He is oriented to person, place, and time. He appears well-developed and well-nourished.  HENT:  Head: Normocephalic and atraumatic.  Eyes: Conjunctivae are normal. Pupils are equal, round, and reactive to light.  Neck: Normal range of motion. Neck supple.  Cardiovascular: Normal rate, regular rhythm and normal heart sounds.   Pulmonary/Chest: Effort normal and breath sounds normal.  Abdominal: Soft. Bowel sounds are normal.  Musculoskeletal: He exhibits tenderness.  Tenderness and swelling to left mcp joint  Neurological: He is alert and oriented to person, place, and time.  Skin: Skin is warm and dry.  Psychiatric: He has a normal mood and affect.  Nursing note and vitals reviewed.   ED Course  Procedures (including critical care time) Labs  Review Labs Reviewed  COMPREHENSIVE METABOLIC PANEL - Abnormal; Notable for the following:    CO2 20 (*)    Glucose, Bld 126 (*)    BUN 43 (*)    Creatinine, Ser 1.67 (*)    Calcium 8.6 (*)    Total Protein 6.1 (*)    Albumin 2.7 (*)    ALT 15 (*)    Alkaline Phosphatase 135 (*)    GFR calc non Af Amer 37 (*)    GFR calc Af Amer 43 (*)  All other components within normal limits  CBC WITH DIFFERENTIAL/PLATELET - Abnormal; Notable for the following:    RBC 3.39 (*)    Hemoglobin 10.3 (*)    HCT 31.6 (*)    All other components within normal limits  URINALYSIS, ROUTINE W REFLEX MICROSCOPIC (NOT AT [facility])    Imaging Review Dg Hand Complete Left  02/07/2016  CLINICAL DATA:  Pain and swelling in the left hand. Fall 3-4 weeks prior. EXAM: LEFT HAND - COMPLETE 3+ VIEW COMPARISON:  None. FINDINGS: There is a 3 mm metallic density in the volar soft tissues between the fourth and fifth metacarpophalangeal joints. No fracture, dislocation or suspicious focal osseous lesion. Moderate osteoarthritis at the first carpometacarpal joint. Diffuse osteopenia. IMPRESSION: 1. No fracture or malalignment. 2. Radiopaque 3 mm foreign body in the volar soft tissues between the fourth and fifth MCP joints. 3. Diffuse osteopenia. 4. Moderate first carpometacarpal joint osteoarthritis. Electronically Signed   By: Ilona Sorrel M.D.   On: 02/07/2016 14:18  NO PAIN OR SWELLING B/T THE 4TH AND 5TH MC JOINTS.  I SUSPECT THAT IS OLD.  I have personally reviewed and evaluated these images and lab results as part of my medical decision-making.   EKG Interpretation None      MDM  PT REALLY WANTS TO GO TO A SNF.  I EXPLAINED THAT WE DO NOT HAVE ANYTHING TO ADMIT PT FOR AND WE CAN'T SEND HIM TO THE SNF FROM HERE.  PT DOES HAVE HOME HEALTH COMING TO HIS HOUSE FROM HERE.  PT IS ENCOURAGED TO F/U WITH PCP TO HELP ARRANGE FOR ASSISTED LIVING OR SNF.  Final diagnoses:  None   LEFT OSTEOARTHRITIS 1ST METACARPAL  JOINT    [name] Pence, MD 02/07/16 [phone]

## 2016-02-07 NOTE — ED Notes (Signed)
Per EMS- patient reports that he fell 3-4 weeks ago and has a fracture of his left arm. Today, he says he has swelling in his left thumb.

## 2016-02-07 NOTE — ED Notes (Signed)
Bed: WA01 Expected date:  Expected time:  Means of arrival:  Comments: EMS fall

## 2016-02-08 DIAGNOSIS — S46001D Unspecified injury of muscle(s) and tendon(s) of the rotator cuff of right shoulder, subsequent encounter: Secondary | ICD-10-CM | POA: Diagnosis not present

## 2016-02-08 DIAGNOSIS — S52102D Unspecified fracture of upper end of left radius, subsequent encounter for closed fracture with routine healing: Secondary | ICD-10-CM | POA: Diagnosis not present

## 2016-02-09 DIAGNOSIS — S52102D Unspecified fracture of upper end of left radius, subsequent encounter for closed fracture with routine healing: Secondary | ICD-10-CM | POA: Diagnosis not present

## 2016-02-09 DIAGNOSIS — S46001D Unspecified injury of muscle(s) and tendon(s) of the rotator cuff of right shoulder, subsequent encounter: Secondary | ICD-10-CM | POA: Diagnosis not present

## 2016-02-10 DIAGNOSIS — M2550 Pain in unspecified joint: Secondary | ICD-10-CM | POA: Diagnosis not present

## 2016-02-15 DIAGNOSIS — S52102D Unspecified fracture of upper end of left radius, subsequent encounter for closed fracture with routine healing: Secondary | ICD-10-CM | POA: Diagnosis not present

## 2016-02-15 DIAGNOSIS — S46001D Unspecified injury of muscle(s) and tendon(s) of the rotator cuff of right shoulder, subsequent encounter: Secondary | ICD-10-CM | POA: Diagnosis not present

## 2016-02-26 ENCOUNTER — Ambulatory Visit (INDEPENDENT_AMBULATORY_CARE_PROVIDER_SITE_OTHER): Payer: Medicare Other | Admitting: Internal Medicine

## 2016-02-26 DIAGNOSIS — I480 Paroxysmal atrial fibrillation: Secondary | ICD-10-CM | POA: Diagnosis not present

## 2016-02-26 LAB — PULMONARY FUNCTION TEST
DL/VA % pred: 105 %
DL/VA: 4.58 ml/min/mmHg/L
DLCO cor % pred: 66 %
DLCO cor: 18.3 ml/min/mmHg
DLCO unc % pred: 62 %
DLCO unc: 17.3 ml/min/mmHg
FEF 25-75 Post: 2.51 L/sec
FEF 25-75 Pre: 2.6 L/sec
FEF2575-%Change-Post: -3 %
FEF2575-%Pred-Post: 156 %
FEF2575-%Pred-Pre: 161 %
FEV1-%Change-Post: -1 %
FEV1-%Pred-Post: 90 %
FEV1-%Pred-Pre: 91 %
FEV1-Post: 2.16 L
FEV1-Pre: 2.2 L
FEV1FVC-%Change-Post: 6 %
FEV1FVC-%Pred-Pre: 117 %
FEV6-%Change-Post: -7 %
FEV6-%Pred-Post: 76 %
FEV6-%Pred-Pre: 82 %
FEV6-Post: 2.43 L
FEV6-Pre: 2.62 L
FEV6FVC-%Change-Post: 0 %
FEV6FVC-%Pred-Post: 108 %
FEV6FVC-%Pred-Pre: 107 %
FVC-%Change-Post: -7 %
FVC-%Pred-Post: 71 %
FVC-%Pred-Pre: 77 %
FVC-Post: 2.44 L
FVC-Pre: 2.65 L
Post FEV1/FVC ratio: 89 %
Post FEV6/FVC ratio: 100 %
Pre FEV1/FVC ratio: 83 %
Pre FEV6/FVC Ratio: 99 %
RV % pred: 178 %
RV: 4.42 L
TLC % pred: 110 %
TLC: 6.99 L

## 2016-02-26 NOTE — Progress Notes (Signed)
PFT done today. 

## 2016-02-29 DIAGNOSIS — I10 Essential (primary) hypertension: Secondary | ICD-10-CM | POA: Diagnosis not present

## 2016-02-29 DIAGNOSIS — R269 Unspecified abnormalities of gait and mobility: Secondary | ICD-10-CM | POA: Diagnosis not present

## 2016-03-04 DIAGNOSIS — H43813 Vitreous degeneration, bilateral: Secondary | ICD-10-CM | POA: Diagnosis not present

## 2016-03-04 DIAGNOSIS — E113293 Type 2 diabetes mellitus with mild nonproliferative diabetic retinopathy without macular edema, bilateral: Secondary | ICD-10-CM | POA: Diagnosis not present

## 2016-03-04 DIAGNOSIS — H353132 Nonexudative age-related macular degeneration, bilateral, intermediate dry stage: Secondary | ICD-10-CM | POA: Diagnosis not present

## 2016-03-04 DIAGNOSIS — H35363 Drusen (degenerative) of macula, bilateral: Secondary | ICD-10-CM | POA: Diagnosis not present

## 2016-04-28 DIAGNOSIS — E782 Mixed hyperlipidemia: Secondary | ICD-10-CM | POA: Diagnosis not present

## 2016-04-28 DIAGNOSIS — K5190 Ulcerative colitis, unspecified, without complications: Secondary | ICD-10-CM | POA: Diagnosis not present

## 2016-04-28 DIAGNOSIS — N400 Enlarged prostate without lower urinary tract symptoms: Secondary | ICD-10-CM | POA: Diagnosis not present

## 2016-04-28 DIAGNOSIS — I5030 Unspecified diastolic (congestive) heart failure: Secondary | ICD-10-CM | POA: Diagnosis not present

## 2016-04-28 DIAGNOSIS — Z7984 Long term (current) use of oral hypoglycemic drugs: Secondary | ICD-10-CM | POA: Diagnosis not present

## 2016-04-28 DIAGNOSIS — N183 Chronic kidney disease, stage 3 (moderate): Secondary | ICD-10-CM | POA: Diagnosis not present

## 2016-04-28 DIAGNOSIS — I10 Essential (primary) hypertension: Secondary | ICD-10-CM | POA: Diagnosis not present

## 2016-04-28 DIAGNOSIS — Z433 Encounter for attention to colostomy: Secondary | ICD-10-CM | POA: Diagnosis not present

## 2016-04-28 DIAGNOSIS — E1122 Type 2 diabetes mellitus with diabetic chronic kidney disease: Secondary | ICD-10-CM | POA: Diagnosis not present

## 2016-04-28 DIAGNOSIS — I4891 Unspecified atrial fibrillation: Secondary | ICD-10-CM | POA: Diagnosis not present

## 2016-04-28 DIAGNOSIS — K219 Gastro-esophageal reflux disease without esophagitis: Secondary | ICD-10-CM | POA: Diagnosis not present

## 2016-06-25 DIAGNOSIS — Z23 Encounter for immunization: Secondary | ICD-10-CM | POA: Diagnosis not present

## 2016-07-05 ENCOUNTER — Encounter: Payer: Self-pay | Admitting: Cardiology

## 2016-07-18 ENCOUNTER — Ambulatory Visit (INDEPENDENT_AMBULATORY_CARE_PROVIDER_SITE_OTHER): Payer: Medicare Other | Admitting: Cardiology

## 2016-07-18 ENCOUNTER — Encounter (INDEPENDENT_AMBULATORY_CARE_PROVIDER_SITE_OTHER): Payer: Self-pay

## 2016-07-18 ENCOUNTER — Encounter: Payer: Self-pay | Admitting: Cardiology

## 2016-07-18 VITALS — BP 138/56 | HR 80 | Ht 70.0 in | Wt 243.4 lb

## 2016-07-18 DIAGNOSIS — E785 Hyperlipidemia, unspecified: Secondary | ICD-10-CM | POA: Diagnosis not present

## 2016-07-18 DIAGNOSIS — I480 Paroxysmal atrial fibrillation: Secondary | ICD-10-CM | POA: Diagnosis not present

## 2016-07-18 DIAGNOSIS — I5032 Chronic diastolic (congestive) heart failure: Secondary | ICD-10-CM | POA: Diagnosis not present

## 2016-07-18 DIAGNOSIS — I10 Essential (primary) hypertension: Secondary | ICD-10-CM | POA: Diagnosis not present

## 2016-07-18 DIAGNOSIS — I2510 Atherosclerotic heart disease of native coronary artery without angina pectoris: Secondary | ICD-10-CM

## 2016-07-18 LAB — LIPID PANEL
Cholesterol: 139 mg/dL (ref 125–200)
HDL: 46 mg/dL (ref >=40)
LDL Cholesterol: 65 mg/dL (ref <130)
Total CHOL/HDL Ratio: 3 Ratio (ref <=5.0)
Triglycerides: 140 mg/dL (ref <150)
VLDL: 28 mg/dL (ref <30)

## 2016-07-18 LAB — HEPATIC FUNCTION PANEL
ALT: 11 U/L (ref 9–46)
AST: 17 U/L (ref 10–35)
Albumin: 3.2 g/dL — ABNORMAL LOW (ref 3.6–5.1)
Alkaline Phosphatase: 96 U/L (ref 40–115)
Bilirubin, Direct: 0.1 mg/dL (ref <=0.2)
Indirect Bilirubin: 0.5 mg/dL (ref 0.2–1.2)
Total Bilirubin: 0.6 mg/dL (ref 0.2–1.2)
Total Protein: 6.1 g/dL (ref 6.1–8.1)

## 2016-07-18 LAB — TSH: TSH: 2.07 mIU/L (ref 0.40–4.50)

## 2016-07-18 NOTE — Patient Instructions (Signed)
Medication Instructions:  Your physician recommends that you continue on your current medications as directed. Please refer to the Current Medication list given to you today.   Labwork: TODAY: TSH, LFTs, Lipids  Testing/Procedures: None  Follow-Up: Your physician wants you to follow-up in: 6 months with Dr. Radford Pax. You will receive a reminder letter in the mail two months in advance. If you don't receive a letter, please call our office to schedule the follow-up appointment.   Any Other Special Instructions Will Be Listed Below (If Applicable).     If you need a refill on your cardiac medications before your next appointment, please call your pharmacy.

## 2016-07-18 NOTE — Progress Notes (Signed)
Cardiology Office Note    Date:  07/18/2016   ID:  [name] [name], DOB 1933-09-21, MRN [id number]  PCP:  [name], MD  Cardiologist:  [name], MD   Chief Complaint  Patient presents with  . Coronary Artery Disease  . Hypertension  . Atrial Fibrillation    History of Present Illness:  [name] [name] is a 82 y.o. male with a history of PAF, 2 vessel ASCAD with chronic total occlusion of the LAD with minimal collaterals and severe disease of the large ramus and normal LVF s/p DES to the ramus. He also has PAF, chronic diastolic CHF and he is here today for followup and he is doing well.  He denies any chest pain, LE edema, dizziness, palpitations or syncope.  He occasionally will have some SOB at night.     Past Medical History:  Diagnosis Date  . Anemia   . Atrial flutter ([place])    a. s/p RFCA of counterclockwise cavo-tricuspid isthmus dependent atrial flutter 2009 by Dr. Rayann Heman.  Marland Kitchen CAD (coronary artery disease) 06/2014   a. 06/2014: abnl nuc. Cath: Totally occluded LAD with faint collaterals (not a candidate for CTO PCI), 90% ramus s/p DES, 50-70% OM2.  . Chronic diastolic CHF (congestive heart failure) ([place])    a. 2D Echo 10/23/13: EF 50-55%, basal mid-inf HK, grade 2 DD, mild MR, mod dilated LA, no sig change from prior.  . CKD (chronic kidney disease), stage III   . Complication of anesthesia    " during my kidney stone surgery my heart went out of rhythm  . Compression fracture of L1 lumbar vertebra ([name])   . Diabetes ([place])   . Dyslipidemia   . Frequent PVCs   . GERD (gastroesophageal reflux disease)   . History of blood transfusion   . History of peptic ulcer disease   . HTN (hypertension)   . Hx of acute renal failure 01/02/10-3/24-11   due to hypovolemic shock, gastroenteritis and dehydration,hospitalized . Did requre a few days of dialysis. Cr at discharge was 1.8  . Kidney disease   . Kidney stone may 2009   Right hydronephrosis, S/P stone removal  . Lower  back pain   . Macular degeneration   . OA (osteoarthritis) of hip   . Obesity   . Osteopenia   . PAF (paroxysmal atrial fibrillation) ([name])    not on long term anticoagulation due to history of heme positive stools and anemia  . Shingles    episode  . Small bowel obstruction, partial 2009   Episode  . Ulcerative colitis ([place])    a. s/p total colectomy remotely.    Past Surgical History:  Procedure Laterality Date  . BACK SURGERY    . CARDIAC CATHETERIZATION  06/19/2014  . COLON RESECTION    . CORONARY ANGIOPLASTY    . CORONARY STENT PLACEMENT  06/19/2014   DES       dr Martinique  . HERNIA REPAIR    . LEFT AND RIGHT HEART CATHETERIZATION WITH CORONARY ANGIOGRAM N/A 06/19/2014   Procedure: LEFT AND RIGHT HEART CATHETERIZATION WITH CORONARY ANGIOGRAM;  Surgeon: Peter M Martinique, MD;  Location: [facility] CATH LAB;  Service: Cardiovascular;  Laterality: N/A;  . TONSILLECTOMY      Current Medications: Outpatient Medications Prior to Visit  Medication Sig Dispense Refill  . acetaminophen (TYLENOL) 500 MG tablet Take 1 tablet (500 mg total) by mouth 3 (three) times daily. Available over the counter 90 tablet 0  . amiodarone (PACERONE)  200 MG tablet TAKE 1 TABLET (200 MG TOTAL) BY MOUTH DAILY. 30 tablet 10  . aspirin 81 MG tablet Take 81 mg by mouth daily.    . calcium carbonate (OS-CAL) 600 MG TABS tablet Take 600 mg by mouth daily with breakfast.    . cholecalciferol (VITAMIN D) 1000 units tablet Take 3,000 Units by mouth daily.    . ferrous sulfate 325 (65 FE) MG tablet Take 1 tablet (325 mg total) by mouth 2 (two) times daily with a meal. 30 tablet 3  . HYDROcodone-acetaminophen (NORCO/VICODIN) 5-325 MG tablet Take 1 tablet by mouth every 4 (four) hours as needed. 6 tablet 0  . metoprolol tartrate (LOPRESSOR) 25 MG tablet Take 0.5 tablets (12.5 mg total) by mouth 2 (two) times daily. 30 tablet 5  . omeprazole (PRILOSEC) 20 MG capsule TAKE 1 CAPSULE (20 MG TOTAL) BY MOUTH DAILY. 30 capsule 6  .  pioglitazone (ACTOS) 30 MG tablet Take 1 tablet (30 mg total) by mouth daily.    . ranitidine (ZANTAC) 150 MG tablet Take 150 mg by mouth every morning.    . saxagliptin HCl (ONGLYZA) 5 MG TABS tablet Take 1 tablet (5 mg total) by mouth daily. 90 tablet 1  . simvastatin (ZOCOR) 20 MG tablet Take 20 mg by mouth daily.    . tamsulosin (FLOMAX) 0.4 MG CAPS capsule Take 1 capsule (0.4 mg total) by mouth daily after supper. 30 capsule 3   No facility-administered medications prior to visit.      Allergies:   Brilinta [ticagrelor]; Penicillins; and Sulfa antibiotics   Social History   Social History  . Marital status: Widowed    Spouse name: N/A  . Number of children: N/A  . Years of education: N/A   Social History Main Topics  . Smoking status: Never Smoker  . Smokeless tobacco: Never Used  . Alcohol use No  . Drug use: No  . Sexual activity: Not Asked   Other Topics Concern  . None   Social History Narrative  . None     Family History:  The patient's family history includes Colon cancer in his mother.   ROS:   Please see the history of present illness.    ROS All other systems reviewed and are negative.  No flowsheet data found.     PHYSICAL EXAM:   VS:  BP (!) 138/56   Pulse 80   Ht 5' 10"  (1.778 m)   Wt 243 lb 6.4 oz (110.4 kg)   SpO2 98% Comment: at rest  BMI 34.92 kg/m    GEN: Well nourished, well developed, in no acute distress  HEENT: normal  Neck: no JVD, carotid bruits, or masses Cardiac: RRR; no murmurs, rubs, or gallops,no edema.  Intact distal pulses bilaterally.  Respiratory:  clear to auscultation bilaterally, normal work of breathing GI: soft, nontender, nondistended, + BS MS: no deformity or atrophy  Skin: warm and dry, no rash Neuro:  Alert and Oriented x 3, Strength and sensation are intact Psych: euthymic mood, full affect  Wt Readings from Last 3 Encounters:  07/18/16 243 lb 6.4 oz (110.4 kg)  01/17/16 239 lb (108.4 kg)  01/12/16 248 lb  (112.5 kg)      Studies/Labs Reviewed:     Recent Labs: 01/14/2016: B Natriuretic Peptide 186.8 02/07/2016: ALT 15; BUN 43; Creatinine, Ser 1.67; Hemoglobin 10.3; Platelets 196; Potassium 4.6; Sodium 137   Lipid Panel    Component Value Date/Time   CHOL 122 01/15/2016 0525  TRIG 99 01/15/2016 0525   HDL 40 (L) 01/15/2016 0525   CHOLHDL 3.1 01/15/2016 0525   VLDL 20 01/15/2016 0525   LDLCALC 62 01/15/2016 0525    Additional studies/ records that were reviewed today include:  none    ASSESSMENT:    1. CAD in native artery- total LAD, s/p RI DES 06/19/14   2. Benign essential HTN   3. Paroxysmal atrial fibrillation ([name])   4. Chronic diastolic CHF (congestive heart failure) ([place])   5. Dyslipidemia      PLAN:  In order of problems listed above:  1. 2 vessel ASCAD with chronic total occlusion of the LAD with minimal collaterals and severe disease of the large ramus and normal LVF s/p DES to the ramus.  Continue ASA/statin/BB. 2. HTN - BP controlled on current meds.  Continue BB. 3. PAF/atrial flutter - s/p aflutter ablation maintaining NSR.  Continue Amio and BB.  He is not on anticoagulation due to prior GI bleeding.  Check TSH. 4. Chronic diastolic CHF - he appears euvolemic on exam.  Continue BB. 5. Dyslipidemia - LDL goal < 70.  Continue statin.  Check FLP and ALT.    Medication Adjustments/Labs and Tests Ordered: Current medicines are reviewed at length with the patient today.  Concerns regarding medicines are outlined above.  Medication changes, Labs and Tests ordered today are listed in the Patient Instructions below.  There are no Patient Instructions on file for this visit.   Signed, [name], MD  07/18/2016 8:12 AM    [facility] [place], [place], [place]  [address] Phone: [phone]; Fax: [phone]

## 2016-09-17 IMAGING — CR DG HAND COMPLETE 3+V*L*
3 series · 3 of 3 positions shown · non-contrast
Comparison: None.

CLINICAL DATA: Pain and swelling in the left hand. Fall 3-4 weeks
prior.

EXAM:
LEFT HAND - COMPLETE 3+ VIEW

[x hand pa left]
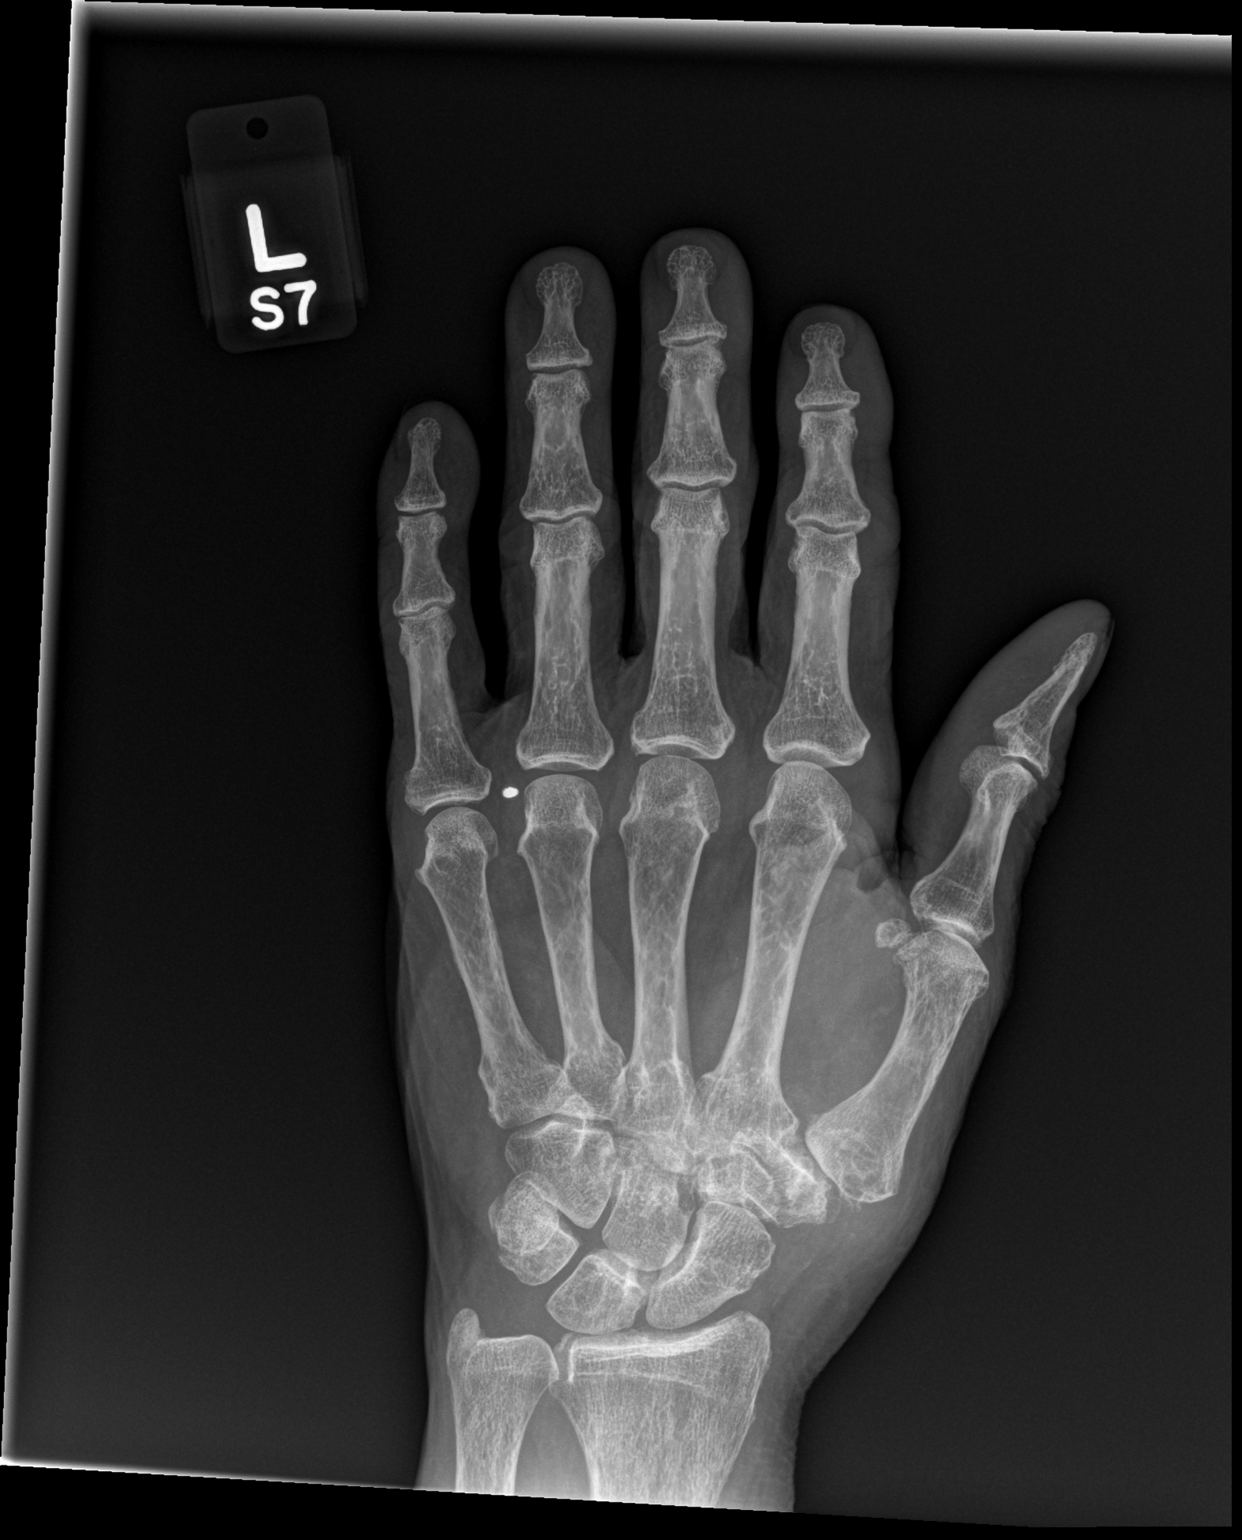

[x hand obl left]
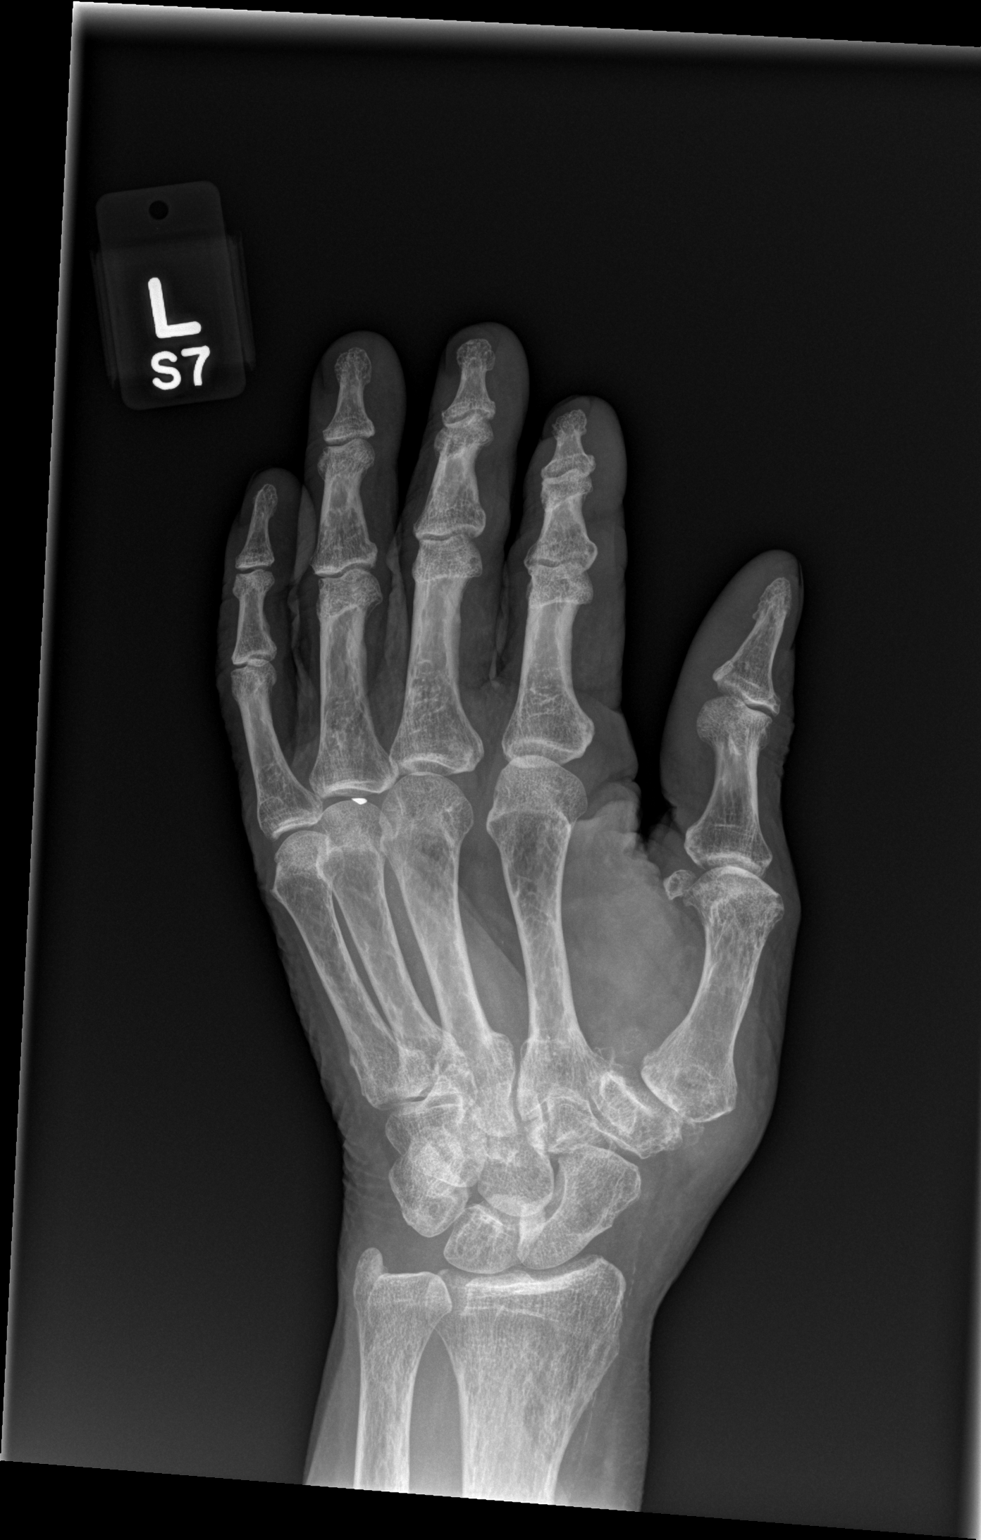

[x hand lat left]
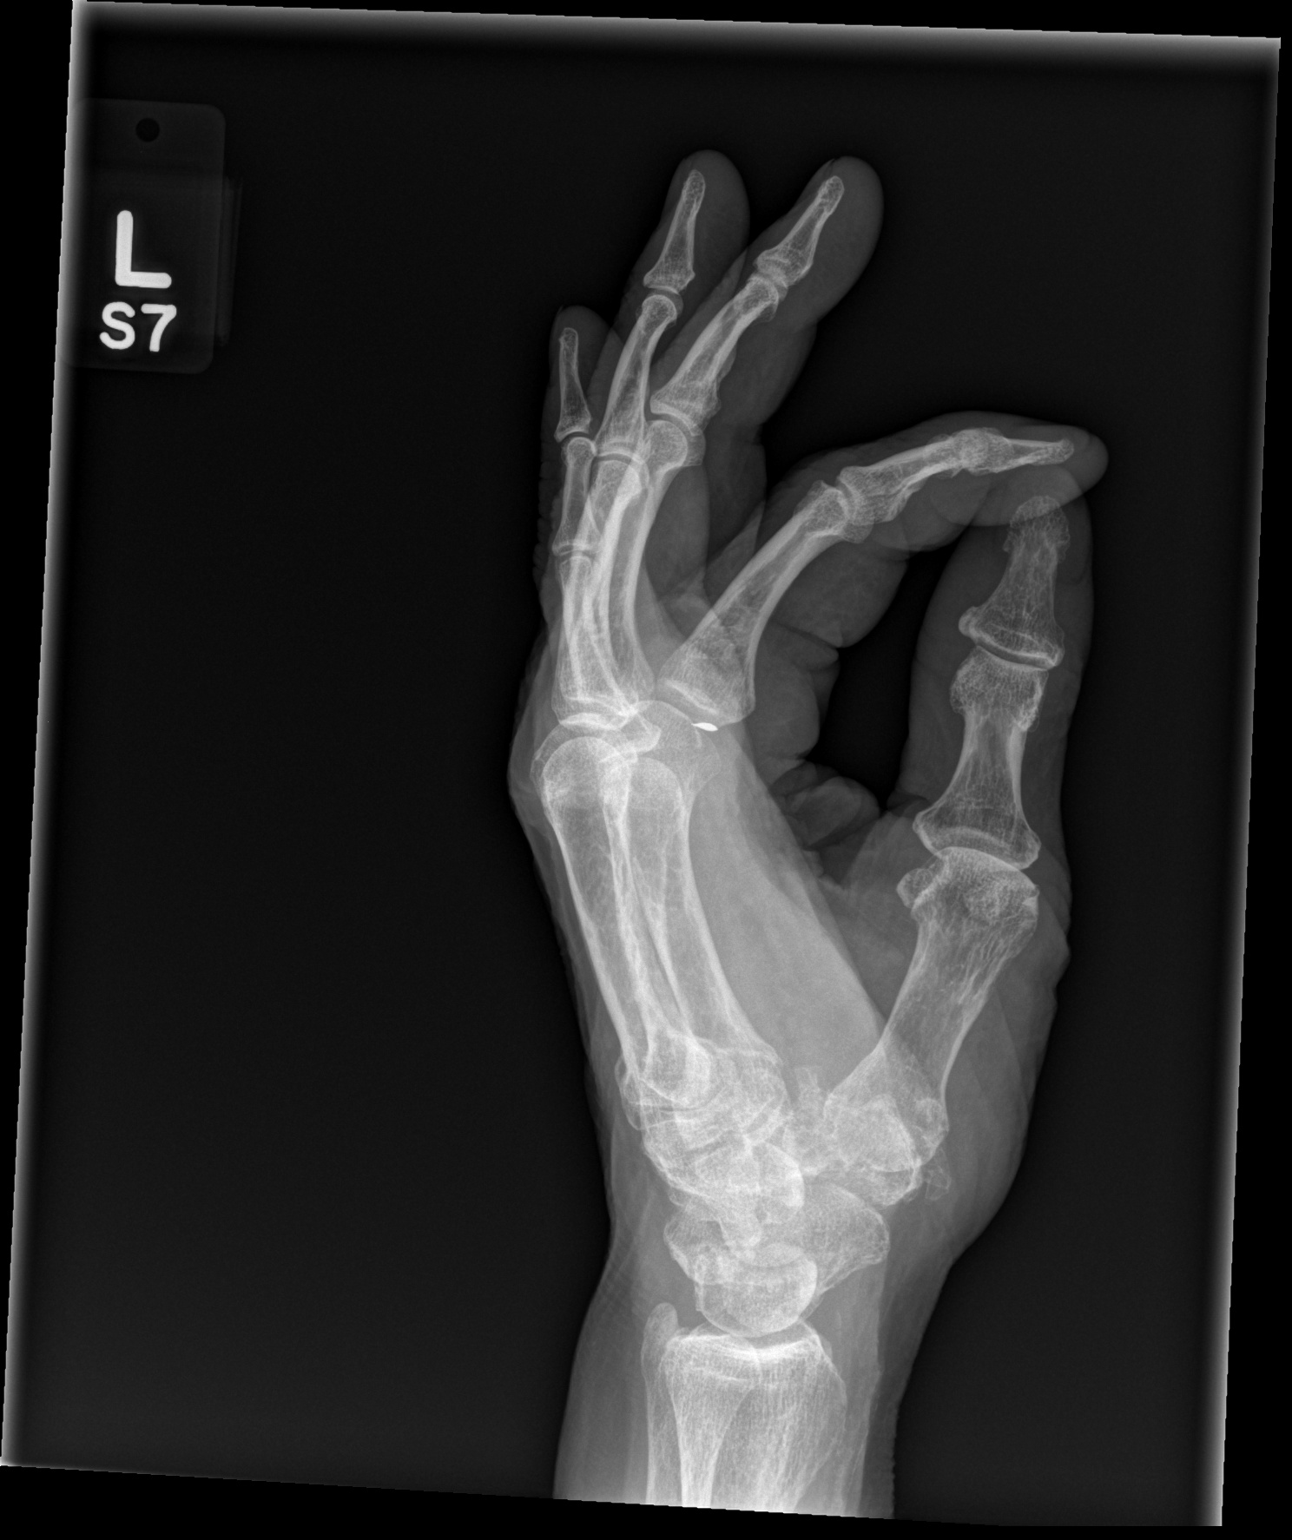

[3 of 3 positions shown; findings below may reference images not displayed]

FINDINGS: There is a 3 mm metallic density in the volar soft tissues between
the fourth and fifth metacarpophalangeal joints. No fracture,
dislocation or suspicious focal osseous lesion. Moderate
osteoarthritis at the first carpometacarpal joint. Diffuse
osteopenia.
IMPRESSION: 1. No fracture or malalignment.
2. Radiopaque 3 mm foreign body in the volar soft tissues between
the fourth and fifth MCP joints.
3. Diffuse osteopenia.
4. Moderate first carpometacarpal joint osteoarthritis.

## 2016-10-03 ENCOUNTER — Other Ambulatory Visit: Payer: Self-pay | Admitting: *Deleted

## 2016-10-03 MED ORDER — AMIODARONE HCL 200 MG PO TABS: ORAL_TABLET | ORAL | 1 refills | Status: DC

## 2016-11-07 DIAGNOSIS — M810 Age-related osteoporosis without current pathological fracture: Secondary | ICD-10-CM | POA: Diagnosis not present

## 2016-11-07 DIAGNOSIS — K5190 Ulcerative colitis, unspecified, without complications: Secondary | ICD-10-CM | POA: Diagnosis not present

## 2016-11-07 DIAGNOSIS — I5030 Unspecified diastolic (congestive) heart failure: Secondary | ICD-10-CM | POA: Diagnosis not present

## 2016-11-07 DIAGNOSIS — K219 Gastro-esophageal reflux disease without esophagitis: Secondary | ICD-10-CM | POA: Diagnosis not present

## 2016-11-07 DIAGNOSIS — N183 Chronic kidney disease, stage 3 (moderate): Secondary | ICD-10-CM | POA: Diagnosis not present

## 2016-11-07 DIAGNOSIS — E782 Mixed hyperlipidemia: Secondary | ICD-10-CM | POA: Diagnosis not present

## 2016-11-07 DIAGNOSIS — D649 Anemia, unspecified: Secondary | ICD-10-CM | POA: Diagnosis not present

## 2016-11-07 DIAGNOSIS — I4891 Unspecified atrial fibrillation: Secondary | ICD-10-CM | POA: Diagnosis not present

## 2016-11-07 DIAGNOSIS — Z1389 Encounter for screening for other disorder: Secondary | ICD-10-CM | POA: Diagnosis not present

## 2016-11-07 DIAGNOSIS — I2510 Atherosclerotic heart disease of native coronary artery without angina pectoris: Secondary | ICD-10-CM | POA: Diagnosis not present

## 2016-11-07 DIAGNOSIS — E1122 Type 2 diabetes mellitus with diabetic chronic kidney disease: Secondary | ICD-10-CM | POA: Diagnosis not present

## 2016-11-07 DIAGNOSIS — Z0000 Encounter for general adult medical examination without abnormal findings: Secondary | ICD-10-CM | POA: Diagnosis not present

## 2016-11-07 DIAGNOSIS — I10 Essential (primary) hypertension: Secondary | ICD-10-CM | POA: Diagnosis not present

## 2016-11-07 DIAGNOSIS — N400 Enlarged prostate without lower urinary tract symptoms: Secondary | ICD-10-CM | POA: Diagnosis not present

## 2016-11-29 DIAGNOSIS — N183 Chronic kidney disease, stage 3 (moderate): Secondary | ICD-10-CM | POA: Diagnosis not present

## 2017-01-04 ENCOUNTER — Inpatient Hospital Stay (HOSPITAL_COMMUNITY)
Admission: EM | Admit: 2017-01-04 | Discharge: 2017-01-08 | DRG: 073 | Disposition: A | Discharge status: Home / Self Care | Payer: Medicare Other | Attending: Internal Medicine | Admitting: Internal Medicine

## 2017-01-04 ENCOUNTER — Observation Stay (HOSPITAL_COMMUNITY): Payer: Medicare Other

## 2017-01-04 ENCOUNTER — Encounter (HOSPITAL_COMMUNITY): Payer: Self-pay | Admitting: Neurology

## 2017-01-04 ENCOUNTER — Emergency Department (HOSPITAL_COMMUNITY): Payer: Medicare Other

## 2017-01-04 DIAGNOSIS — I517 Cardiomegaly: Secondary | ICD-10-CM | POA: Diagnosis not present

## 2017-01-04 DIAGNOSIS — R404 Transient alteration of awareness: Secondary | ICD-10-CM | POA: Diagnosis not present

## 2017-01-04 DIAGNOSIS — K5190 Ulcerative colitis, unspecified, without complications: Secondary | ICD-10-CM | POA: Diagnosis present

## 2017-01-04 DIAGNOSIS — I2510 Atherosclerotic heart disease of native coronary artery without angina pectoris: Secondary | ICD-10-CM | POA: Diagnosis present

## 2017-01-04 DIAGNOSIS — E1121 Type 2 diabetes mellitus with diabetic nephropathy: Secondary | ICD-10-CM

## 2017-01-04 DIAGNOSIS — K219 Gastro-esophageal reflux disease without esophagitis: Secondary | ICD-10-CM | POA: Diagnosis present

## 2017-01-04 DIAGNOSIS — D631 Anemia in chronic kidney disease: Secondary | ICD-10-CM | POA: Diagnosis present

## 2017-01-04 DIAGNOSIS — Z882 Allergy status to sulfonamides status: Secondary | ICD-10-CM

## 2017-01-04 DIAGNOSIS — N183 Chronic kidney disease, stage 3 unspecified: Secondary | ICD-10-CM | POA: Diagnosis present

## 2017-01-04 DIAGNOSIS — Z800 Family history of malignant neoplasm of digestive organs: Secondary | ICD-10-CM

## 2017-01-04 DIAGNOSIS — I5032 Chronic diastolic (congestive) heart failure: Secondary | ICD-10-CM | POA: Diagnosis not present

## 2017-01-04 DIAGNOSIS — Z9049 Acquired absence of other specified parts of digestive tract: Secondary | ICD-10-CM

## 2017-01-04 DIAGNOSIS — E118 Type 2 diabetes mellitus with unspecified complications: Secondary | ICD-10-CM

## 2017-01-04 DIAGNOSIS — Z888 Allergy status to other drugs, medicaments and biological substances status: Secondary | ICD-10-CM

## 2017-01-04 DIAGNOSIS — E785 Hyperlipidemia, unspecified: Secondary | ICD-10-CM | POA: Diagnosis present

## 2017-01-04 DIAGNOSIS — M8580 Other specified disorders of bone density and structure, unspecified site: Secondary | ICD-10-CM | POA: Diagnosis present

## 2017-01-04 DIAGNOSIS — H3530 Unspecified macular degeneration: Secondary | ICD-10-CM | POA: Diagnosis present

## 2017-01-04 DIAGNOSIS — S0990XA Unspecified injury of head, initial encounter: Secondary | ICD-10-CM | POA: Diagnosis not present

## 2017-01-04 DIAGNOSIS — E1142 Type 2 diabetes mellitus with diabetic polyneuropathy: Principal | ICD-10-CM | POA: Diagnosis present

## 2017-01-04 DIAGNOSIS — Z66 Do not resuscitate: Secondary | ICD-10-CM | POA: Diagnosis present

## 2017-01-04 DIAGNOSIS — E669 Obesity, unspecified: Secondary | ICD-10-CM | POA: Diagnosis present

## 2017-01-04 DIAGNOSIS — I4819 Other persistent atrial fibrillation: Secondary | ICD-10-CM | POA: Diagnosis present

## 2017-01-04 DIAGNOSIS — K5100 Ulcerative (chronic) pancolitis without complications: Secondary | ICD-10-CM | POA: Diagnosis not present

## 2017-01-04 DIAGNOSIS — I130 Hypertensive heart and chronic kidney disease with heart failure and stage 1 through stage 4 chronic kidney disease, or unspecified chronic kidney disease: Secondary | ICD-10-CM | POA: Diagnosis present

## 2017-01-04 DIAGNOSIS — R200 Anesthesia of skin: Secondary | ICD-10-CM | POA: Diagnosis not present

## 2017-01-04 DIAGNOSIS — Z86711 Personal history of pulmonary embolism: Secondary | ICD-10-CM

## 2017-01-04 DIAGNOSIS — Z7982 Long term (current) use of aspirin: Secondary | ICD-10-CM

## 2017-01-04 DIAGNOSIS — I679 Cerebrovascular disease, unspecified: Secondary | ICD-10-CM | POA: Diagnosis present

## 2017-01-04 DIAGNOSIS — Z7984 Long term (current) use of oral hypoglycemic drugs: Secondary | ICD-10-CM

## 2017-01-04 DIAGNOSIS — R269 Unspecified abnormalities of gait and mobility: Secondary | ICD-10-CM

## 2017-01-04 DIAGNOSIS — I480 Paroxysmal atrial fibrillation: Secondary | ICD-10-CM | POA: Diagnosis not present

## 2017-01-04 DIAGNOSIS — I10 Essential (primary) hypertension: Secondary | ICD-10-CM | POA: Diagnosis not present

## 2017-01-04 DIAGNOSIS — R29898 Other symptoms and signs involving the musculoskeletal system: Secondary | ICD-10-CM | POA: Diagnosis not present

## 2017-01-04 DIAGNOSIS — R531 Weakness: Secondary | ICD-10-CM

## 2017-01-04 DIAGNOSIS — Z880 Allergy status to penicillin: Secondary | ICD-10-CM

## 2017-01-04 DIAGNOSIS — E1151 Type 2 diabetes mellitus with diabetic peripheral angiopathy without gangrene: Secondary | ICD-10-CM | POA: Diagnosis present

## 2017-01-04 DIAGNOSIS — R454 Irritability and anger: Secondary | ICD-10-CM | POA: Diagnosis present

## 2017-01-04 DIAGNOSIS — E1122 Type 2 diabetes mellitus with diabetic chronic kidney disease: Secondary | ICD-10-CM | POA: Diagnosis present

## 2017-01-04 DIAGNOSIS — Z933 Colostomy status: Secondary | ICD-10-CM

## 2017-01-04 DIAGNOSIS — I5043 Acute on chronic combined systolic (congestive) and diastolic (congestive) heart failure: Secondary | ICD-10-CM | POA: Diagnosis not present

## 2017-01-04 DIAGNOSIS — M1610 Unilateral primary osteoarthritis, unspecified hip: Secondary | ICD-10-CM | POA: Diagnosis present

## 2017-01-04 DIAGNOSIS — R296 Repeated falls: Secondary | ICD-10-CM | POA: Diagnosis present

## 2017-01-04 DIAGNOSIS — Z955 Presence of coronary angioplasty implant and graft: Secondary | ICD-10-CM

## 2017-01-04 HISTORY — DX: Dyspnea, unspecified: R06.00

## 2017-01-04 LAB — I-STAT TROPONIN, ED: Troponin i, poc: 0.02 ng/mL (ref 0.00–0.08)

## 2017-01-04 LAB — COMPREHENSIVE METABOLIC PANEL
ALT: 14 U/L — ABNORMAL LOW (ref 17–63)
AST: 19 U/L (ref 15–41)
Albumin: 2.9 g/dL — ABNORMAL LOW (ref 3.5–5.0)
Alkaline Phosphatase: 87 U/L (ref 38–126)
Anion gap: 12 (ref 5–15)
BUN: 31 mg/dL — ABNORMAL HIGH (ref 6–20)
CO2: 20 mmol/L — ABNORMAL LOW (ref 22–32)
Calcium: 8.9 mg/dL (ref 8.9–10.3)
Chloride: 106 mmol/L (ref 101–111)
Creatinine, Ser: 1.68 mg/dL — ABNORMAL HIGH (ref 0.61–1.24)
GFR calc Af Amer: 42 mL/min — ABNORMAL LOW (ref >60)
GFR calc non Af Amer: 37 mL/min — ABNORMAL LOW (ref >60)
Glucose, Bld: 148 mg/dL — ABNORMAL HIGH (ref 65–99)
Potassium: 5 mmol/L (ref 3.5–5.1)
Sodium: 138 mmol/L (ref 135–145)
Total Bilirubin: 0.6 mg/dL (ref 0.3–1.2)
Total Protein: 6.3 g/dL — ABNORMAL LOW (ref 6.5–8.1)

## 2017-01-04 LAB — CBC
HCT: 34.3 % — ABNORMAL LOW (ref 39.0–52.0)
Hemoglobin: 10.9 g/dL — ABNORMAL LOW (ref 13.0–17.0)
MCH: 30 pg (ref 26.0–34.0)
MCHC: 31.8 g/dL (ref 30.0–36.0)
MCV: 94.5 fL (ref 78.0–100.0)
Platelets: 230 10*3/uL (ref 150–400)
RBC: 3.63 MIL/uL — ABNORMAL LOW (ref 4.22–5.81)
RDW: 15 % (ref 11.5–15.5)
WBC: 6.5 10*3/uL (ref 4.0–10.5)

## 2017-01-04 LAB — RAPID URINE DRUG SCREEN, HOSP PERFORMED
Amphetamines: NOT DETECTED
Barbiturates: NOT DETECTED
Benzodiazepines: NOT DETECTED
Cocaine: NOT DETECTED
Opiates: NOT DETECTED
Tetrahydrocannabinol: NOT DETECTED

## 2017-01-04 LAB — I-STAT CHEM 8, ED
BUN: 45 mg/dL — ABNORMAL HIGH (ref 6–20)
Calcium, Ion: 1.13 mmol/L — ABNORMAL LOW (ref 1.15–1.40)
Chloride: 105 mmol/L (ref 101–111)
Creatinine, Ser: 1.6 mg/dL — ABNORMAL HIGH (ref 0.61–1.24)
Glucose, Bld: 146 mg/dL — ABNORMAL HIGH (ref 65–99)
HCT: 33 % — ABNORMAL LOW (ref 39.0–52.0)
Hemoglobin: 11.2 g/dL — ABNORMAL LOW (ref 13.0–17.0)
Potassium: 5.1 mmol/L (ref 3.5–5.1)
Sodium: 137 mmol/L (ref 135–145)
TCO2: 24 mmol/L (ref 0–100)

## 2017-01-04 LAB — DIFFERENTIAL
Basophils Absolute: 0 10*3/uL (ref 0.0–0.1)
Basophils Relative: 0 %
Eosinophils Absolute: 0.2 10*3/uL (ref 0.0–0.7)
Eosinophils Relative: 3 %
Lymphocytes Relative: 10 %
Lymphs Abs: 0.7 10*3/uL (ref 0.7–4.0)
Monocytes Absolute: 0.6 10*3/uL (ref 0.1–1.0)
Monocytes Relative: 10 %
Neutro Abs: 4.9 10*3/uL (ref 1.7–7.7)
Neutrophils Relative %: 77 %

## 2017-01-04 LAB — URINALYSIS, ROUTINE W REFLEX MICROSCOPIC
Bilirubin Urine: NEGATIVE
Glucose, UA: NEGATIVE mg/dL
Hgb urine dipstick: NEGATIVE
Ketones, ur: NEGATIVE mg/dL
Leukocytes, UA: NEGATIVE
Nitrite: NEGATIVE
Protein, ur: NEGATIVE mg/dL
Specific Gravity, Urine: 1.012 (ref 1.005–1.030)
pH: 5 (ref 5.0–8.0)

## 2017-01-04 LAB — ETHANOL: Alcohol, Ethyl (B): <5 mg/dL (ref <5)

## 2017-01-04 LAB — APTT: aPTT: 34 seconds (ref 24–36)

## 2017-01-04 LAB — GLUCOSE, CAPILLARY
Glucose-Capillary: 137 mg/dL — ABNORMAL HIGH (ref 65–99)
Glucose-Capillary: 96 mg/dL (ref 65–99)

## 2017-01-04 LAB — PROTIME-INR
INR: 1.07
Prothrombin Time: 14 seconds (ref 11.4–15.2)

## 2017-01-04 MED ORDER — STROKE: EARLY STAGES OF RECOVERY BOOK
Freq: Once | Status: DC
  Filled 2017-01-04: qty 1

## 2017-01-04 MED ORDER — AMIODARONE HCL 200 MG PO TABS
200.0000 mg | ORAL_TABLET | Freq: Every day | ORAL | Status: DC
  Administered 2017-01-04 – 2017-01-08 (×5): 200 mg via ORAL
  Filled 2017-01-04 (×5): qty 1

## 2017-01-04 MED ORDER — FERROUS SULFATE 325 (65 FE) MG PO TABS
325.0000 mg | ORAL_TABLET | Freq: Two times a day (BID) | ORAL | Status: DC
  Administered 2017-01-05 – 2017-01-08 (×7): 325 mg via ORAL
  Filled 2017-01-04 (×7): qty 1

## 2017-01-04 MED ORDER — TAMSULOSIN HCL 0.4 MG PO CAPS
0.4000 mg | ORAL_CAPSULE | Freq: Every day | ORAL | Status: DC
  Administered 2017-01-04 – 2017-01-07 (×4): 0.4 mg via ORAL
  Filled 2017-01-04 (×4): qty 1

## 2017-01-04 MED ORDER — ENOXAPARIN SODIUM 40 MG/0.4ML ~~LOC~~ SOLN
40.0000 mg | SUBCUTANEOUS | Status: DC
  Administered 2017-01-04 – 2017-01-05 (×2): 40 mg via SUBCUTANEOUS
  Filled 2017-01-04 (×2): qty 0.4

## 2017-01-04 MED ORDER — SODIUM CHLORIDE 0.9 % IV SOLN
INTRAVENOUS | Status: DC
  Administered 2017-01-04: via INTRAVENOUS

## 2017-01-04 MED ORDER — SIMVASTATIN 20 MG PO TABS
20.0000 mg | ORAL_TABLET | Freq: Every day | ORAL | Status: DC
  Administered 2017-01-04 – 2017-01-07 (×4): 20 mg via ORAL
  Filled 2017-01-04 (×4): qty 1

## 2017-01-04 MED ORDER — INSULIN ASPART 100 UNIT/ML ~~LOC~~ SOLN: 0.0000 [IU] | Freq: Every day | SUBCUTANEOUS | Status: DC

## 2017-01-04 MED ORDER — FAMOTIDINE 20 MG PO TABS
10.0000 mg | ORAL_TABLET | Freq: Every day | ORAL | Status: DC
  Administered 2017-01-04 – 2017-01-06 (×3): 10 mg via ORAL
  Administered 2017-01-07: 20 mg via ORAL
  Administered 2017-01-08: 10 mg via ORAL
  Filled 2017-01-04 (×5): qty 1

## 2017-01-04 MED ORDER — ASPIRIN 325 MG PO TABS
325.0000 mg | ORAL_TABLET | Freq: Once | ORAL | Status: AC
  Administered 2017-01-04: 325 mg via ORAL
  Filled 2017-01-04: qty 1

## 2017-01-04 MED ORDER — SENNOSIDES-DOCUSATE SODIUM 8.6-50 MG PO TABS: 1.0000 | ORAL_TABLET | Freq: Every evening | ORAL | Status: DC | PRN

## 2017-01-04 MED ORDER — ACETAMINOPHEN 650 MG RE SUPP: 650.0000 mg | RECTAL | Status: DC | PRN

## 2017-01-04 MED ORDER — INSULIN ASPART 100 UNIT/ML ~~LOC~~ SOLN
0.0000 [IU] | Freq: Three times a day (TID) | SUBCUTANEOUS | Status: DC
  Administered 2017-01-05: 1 [IU] via SUBCUTANEOUS
  Administered 2017-01-05: 2 [IU] via SUBCUTANEOUS
  Administered 2017-01-06 – 2017-01-08 (×4): 1 [IU] via SUBCUTANEOUS
  Administered 2017-01-08: 2 [IU] via SUBCUTANEOUS

## 2017-01-04 MED ORDER — HYDROCODONE-ACETAMINOPHEN 5-325 MG PO TABS
1.0000 | ORAL_TABLET | ORAL | Status: DC | PRN
  Administered 2017-01-05 – 2017-01-06 (×3): 1 via ORAL
  Filled 2017-01-04 (×3): qty 1

## 2017-01-04 MED ORDER — PANTOPRAZOLE SODIUM 40 MG PO TBEC
40.0000 mg | DELAYED_RELEASE_TABLET | Freq: Every day | ORAL | Status: DC
  Administered 2017-01-04 – 2017-01-08 (×5): 40 mg via ORAL
  Filled 2017-01-04 (×5): qty 1

## 2017-01-04 MED ORDER — ACETAMINOPHEN 325 MG PO TABS: 650.0000 mg | ORAL_TABLET | ORAL | Status: DC | PRN

## 2017-01-04 MED ORDER — ACETAMINOPHEN 160 MG/5ML PO SOLN: 650.0000 mg | ORAL | Status: DC | PRN

## 2017-01-04 NOTE — ED Notes (Signed)
Once patient comes back to ED from MRI, he will be transported to floor.

## 2017-01-04 NOTE — ED Triage Notes (Addendum)
Per ems- pt is coming from home. He woke up this morning at 0130 had numbness to left leg with difficulty walking. Was able to get to bathroom and back. Woke up at 0600 with same numbness to leg. Is having to use walker to ambulate which he normally does not. Some weakness to left leg. Has pain to bottom of left foot which is normal for neuropathy. Lung sounds clear. Has hx of PE. Was on blood thinner, but it was stopped. He did stumble and fall this morning, no injury, or LOC. He reports he was getting dressed in his bedroom and lost his balance and fell backwards.  BP 160/71, HR 80, RR 16, 97% RA, CBG 168. 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

## 2017-01-04 NOTE — ED Notes (Signed)
Pt transported to MRI 

## 2017-01-04 NOTE — ED Notes (Signed)
Patient back from MRI. On way to floor at this time.

## 2017-01-04 NOTE — ED Provider Notes (Signed)
[facility] DEPT Provider Note   CSN: [id number] Arrival date & time: 01/04/17  1478     History   Chief Complaint Chief Complaint  Patient presents with  . Numbness    HPI [name] [name] is a 83 y.o. male.  HPI Patient presents with left leg weakness and numbness. States he woke up around 1:30 this morning with it and then woke up again at 6:30 this morning with it. States he was able to walk to the bathroom at 1:30 but had more difficulty at 6 this morning. At baseline he can walk around a little bit without a walker but if he is doing more distance he does need a walker. No chest pain. No trouble breathing. States he has some chronic numbness in his feet. States he feels unsteady also. No headache. No confusion. No trauma. States he has had a few different falls but none yesterday.   Past Medical History:  Diagnosis Date  . Anemia   . Atrial flutter ([place])    a. s/p RFCA of counterclockwise cavo-tricuspid isthmus dependent atrial flutter 2009 by Dr. Rayann Heman.  Marland Kitchen CAD (coronary artery disease) 06/2014   a. 06/2014: abnl nuc. Cath: Totally occluded LAD with faint collaterals (not a candidate for CTO PCI), 90% ramus s/p DES, 50-70% OM2.  . Chronic diastolic CHF (congestive heart failure) ([place])    a. 2D Echo 10/23/13: EF 50-55%, basal mid-inf HK, grade 2 DD, mild MR, mod dilated LA, no sig change from prior.  . CKD (chronic kidney disease), stage III   . Complication of anesthesia    " during my kidney stone surgery my heart went out of rhythm  . Compression fracture of L1 lumbar vertebra ([name])   . Diabetes ([place])   . Dyslipidemia   . Frequent PVCs   . GERD (gastroesophageal reflux disease)   . History of blood transfusion   . History of peptic ulcer disease   . HTN (hypertension)   . Hx of acute renal failure 01/02/10-3/24-11   due to hypovolemic shock, gastroenteritis and dehydration,hospitalized . Did requre a few days of dialysis. Cr at discharge was 1.8  . Kidney disease   .  Kidney stone may 2009   Right hydronephrosis, S/P stone removal  . Lower back pain   . Macular degeneration   . OA (osteoarthritis) of hip   . Obesity   . Osteopenia   . PAF (paroxysmal atrial fibrillation) ([name])    not on long term anticoagulation due to history of heme positive stools and anemia  . Shingles    episode  . Small bowel obstruction, partial 2009   Episode  . Ulcerative colitis ([place])    a. s/p total colectomy remotely.    Patient Active Problem List   Diagnosis Date Noted  . Weakness 01/14/2016  . Near syncope 01/14/2016  . Type 2 diabetes mellitus with renal complication ([place]) 29/56/2130  . Orthostatic hypotension 11/05/2014  . CKD (chronic kidney disease), stage III   . GERD (gastroesophageal reflux disease)   . Chronic diastolic CHF (congestive heart failure) ([place])   . Acute renal failure syndrome ([place])   . Paroxysmal atrial fibrillation ([name])   . Bleeding gastrointestinal   . Ureteral stone with hydronephrosis 10/23/2014  . Symptomatic anemia 10/22/2014  . Abdominal pain, generalized 10/22/2014  . Dyslipidemia 10/14/2014  . Upper airway cough syndrome 07/15/2014  . CAD in native artery- total LAD, s/p RI DES 06/19/14 07/11/2014  . Frequent PVCs 07/11/2014  . Benign essential HTN  10/02/2013  . Diabetes ([place])   . Obesity   . Ulcerative colitis [facility])     Past Surgical History:  Procedure Laterality Date  . BACK SURGERY    . CARDIAC CATHETERIZATION  06/19/2014  . COLON RESECTION    . CORONARY ANGIOPLASTY    . CORONARY STENT PLACEMENT  06/19/2014   DES       dr Martinique  . HERNIA REPAIR    . LEFT AND RIGHT HEART CATHETERIZATION WITH CORONARY ANGIOGRAM N/A 06/19/2014   Procedure: LEFT AND RIGHT HEART CATHETERIZATION WITH CORONARY ANGIOGRAM;  Surgeon: Peter M Martinique, MD;  Location: [facility] CATH LAB;  Service: Cardiovascular;  Laterality: N/A;  . TONSILLECTOMY         Home Medications    Prior to Admission medications   Medication Sig Start Date End Date  Taking? Authorizing Provider  acetaminophen (TYLENOL) 500 MG tablet Take 1 tablet (500 mg total) by mouth 3 (three) times daily. Available over the counter 01/17/16   Ripudeep K Rai, MD  amiodarone (PACERONE) 200 MG tablet TAKE 1 TABLET (200 MG TOTAL) BY MOUTH DAILY. 10/03/16   Sueanne Margarita, MD  aspirin 81 MG tablet Take 81 mg by mouth daily.    Historical Provider, MD  calcium carbonate (OS-CAL) 600 MG TABS tablet Take 600 mg by mouth daily with breakfast.    Historical Provider, MD  cholecalciferol (VITAMIN D) 1000 units tablet Take 3,000 Units by mouth daily.    Historical Provider, MD  ferrous sulfate 325 (65 FE) MG tablet Take 1 tablet (325 mg total) by mouth 2 (two) times daily with a meal. 10/27/14   Florencia Reasons, MD  HYDROcodone-acetaminophen (NORCO/VICODIN) 5-325 MG tablet Take 1 tablet by mouth every 4 (four) hours as needed. 02/07/16   Isla Pence, MD  metoprolol tartrate (LOPRESSOR) 25 MG tablet Take 0.5 tablets (12.5 mg total) by mouth 2 (two) times daily. 12/26/14   Sueanne Margarita, MD  omeprazole (PRILOSEC) 20 MG capsule TAKE 1 CAPSULE (20 MG TOTAL) BY MOUTH DAILY. 12/01/15   Irene Shipper, MD  pioglitazone (ACTOS) 30 MG tablet Take 1 tablet (30 mg total) by mouth daily. 11/05/14   Dayna N Dunn, PA-C  ranitidine (ZANTAC) 150 MG tablet Take 150 mg by mouth every morning.    Historical Provider, MD  saxagliptin HCl (ONGLYZA) 5 MG TABS tablet Take 1 tablet (5 mg total) by mouth daily. 06/20/14   Tanda Rockers, MD  simvastatin (ZOCOR) 20 MG tablet Take 20 mg by mouth daily.    Historical Provider, MD  tamsulosin (FLOMAX) 0.4 MG CAPS capsule Take 1 capsule (0.4 mg total) by mouth daily after supper. 10/27/14   Florencia Reasons, MD    Family History Family History  Problem Relation Age of Onset  . Colon cancer Mother     Social History Social History  Substance Use Topics  . Smoking status: Never Smoker  . Smokeless tobacco: Never Used  . Alcohol use No     Allergies   Brilinta [ticagrelor];  Penicillins; and Sulfa antibiotics   Review of Systems Review of Systems  Constitutional: Negative for appetite change.  HENT: Negative for congestion.   Eyes: Negative for photophobia.  Respiratory: Negative for shortness of breath.   Cardiovascular: Negative for chest pain.  Gastrointestinal: Negative for abdominal pain.  Genitourinary: Negative for flank pain.  Musculoskeletal: Negative for back pain.  Skin: Negative for wound.  Neurological: Positive for weakness and numbness.  Psychiatric/Behavioral: Negative for confusion.  Physical Exam Updated Vital Signs BP (!) 166/65   Pulse 81   Temp 97.4 F (36.3 C) (Oral)   Resp (!) 31   SpO2 100%   Physical Exam  Constitutional: He is oriented to person, place, and time. He appears well-developed.  HENT:  Head: Atraumatic.  Eyes: EOM are normal.  Cardiovascular: Normal rate.   Pulmonary/Chest: Effort normal.  Abdominal: Soft. There is no tenderness.  Musculoskeletal: He exhibits tenderness.  Some tenderness to left shoulder.  Neurological: He is alert and oriented to person, place, and time.  Patient is awake and appropriate. Face is symmetric. Eye movements intact. Does have good grips bilaterally. Unable to raise shoulders due to chronic shoulder issues. Sensation appears intact both hands. Sensation intact in both feet grossly. Good flexion extension at the ankle. He is able to do straight leg raise bilaterally. Heel shin appears to be more off with left heel on his right shin.  Able to stand but not able to ambulate. Patient states he is not able to do it.   Skin: Skin is warm. Capillary refill takes less than 2 seconds.     ED Treatments / Results  Labs (all labs ordered are listed, but only abnormal results are displayed) Labs Reviewed  CBC - Abnormal; Notable for the following:       Result Value   RBC 3.63 (*)    Hemoglobin 10.9 (*)    HCT 34.3 (*)    All other components within normal limits    COMPREHENSIVE METABOLIC PANEL - Abnormal; Notable for the following:    CO2 20 (*)    Glucose, Bld 148 (*)    BUN 31 (*)    Creatinine, Ser 1.68 (*)    Total Protein 6.3 (*)    Albumin 2.9 (*)    ALT 14 (*)    GFR calc non Af Amer 37 (*)    GFR calc Af Amer 42 (*)    All other components within normal limits  I-STAT CHEM 8, ED - Abnormal; Notable for the following:    BUN 45 (*)    Creatinine, Ser 1.60 (*)    Glucose, Bld 146 (*)    Calcium, Ion 1.13 (*)    Hemoglobin 11.2 (*)    HCT 33.0 (*)    All other components within normal limits  ETHANOL  PROTIME-INR  APTT  DIFFERENTIAL  RAPID URINE DRUG SCREEN, HOSP PERFORMED  URINALYSIS, ROUTINE W REFLEX MICROSCOPIC  I-STAT TROPOININ, ED    EKG  EKG Interpretation  Date/Time:  Wednesday January 04 2017 10:17:09 EDT Ventricular Rate:  78 PR Interval:    QRS Duration: 133 QT Interval:  416 QTC Calculation: 474 R Axis:   -63 Text Interpretation:  Sinus rhythm Short PR interval Probable left atrial enlargement RBBB and LAFB Left ventricular hypertrophy No significant change since last tracing Confirmed by Alvino Chapel  MD, Graci Hulce [phone]) on 01/04/2017 10:31:22 AM       Radiology Ct Head Wo Contrast  Result Date: 01/04/2017 CLINICAL DATA:  Leg weakness.  Fall today. EXAM: CT HEAD WITHOUT CONTRAST TECHNIQUE: Contiguous axial images were obtained from the base of the skull through the vertex without intravenous contrast. COMPARISON:  01/14/2016 FINDINGS: Brain: No acute intracranial abnormality. Specifically, no hemorrhage, hydrocephalus, mass lesion, acute infarction, or significant intracranial injury. Vascular: No hyperdense vessel or unexpected calcification. Skull: No acute calvarial abnormality. Sinuses/Orbits: Mucosal thickening in the paranasal sinuses. Old left medial orbital wall blowout fracture an nasal bone fractures. Mastoid air  cells are clear. Other: None IMPRESSION: No acute intracranial abnormality. Electronically Signed    By: Rolm Baptise M.D.   On: 01/04/2017 10:48    Procedures Procedures (including critical care time)  Medications Ordered in ED Medications - No data to display   Initial Impression / Assessment and Plan / ED Course  I have reviewed the triage vital signs and the nursing notes.  Pertinent labs & imaging results that were available during my care of the patient were reviewed by me and considered in my medical decision making (see chart for details).     Patient presents with left leg weakness. Difficulty walking. Began sometime last night. Not a TPA candidate due to time of onset. Is somewhat weaker on that side although it is a difficult baseline. Initial CT reassuring. Will admit to internal medicine. Stroke rule out.  Final Clinical Impressions(s) / ED Diagnoses   Final diagnoses:  Left leg weakness    New Prescriptions New Prescriptions   No medications on file     Davonna Belling, MD 01/04/17 1055

## 2017-01-04 NOTE — Consult Note (Signed)
Neurology Consult Note  Reason for Consultation: Left leg weakness  Requesting provider: Davonna Belling, MD  CC: numbness in both legs  HPI: This is an 85-yo man who presented via EMS after a fall at home. History is obtained directly from the patient who is a good historian. His sister is present at the bedside and offers additional information as needed.   He reports that he was getting ready to go out to eat this morning when his legs gave out from underneath him and he fell to the floor. He was unable to get up and pressed his medical alert button. EMS arrived and he was subsequently transported to the hospital for evaluation. He tells me that he has some chronic numbness and weakness in both legs which can be worse in one leg or the other at any given time. Yesterday he felt as if these were worse in his right leg. This morning he felt like his left leg was weaker. Currently he says he has numbness in both legs. He has had several falls at home over the past year according to his sister. He has been told that he should be using a walker for ambulation. He says that he uses a walker to get out of his house because there is one step. He also uses the walker when he is on uneven surfaces but uses a cane if on a flat surface. When he is at home he says he does not use either the walker or the cane inside his house.   He denies any injury from today's fall. He has some chronic pain and difficulty lifting both arms, left more than right, due to chronic shoulder problems; these are unchanged today. He denies any head, neck, or back pain. He has not appreciated any change in bowel or bladder function. He has chronic numbness in both hands and forearms which is unchanged today. He reports burning discomfort in both feet and has been told that he has neuropathy.   PMH:  Past Medical History:  Diagnosis Date  . Anemia   . Atrial flutter ([place])    a. s/p RFCA of counterclockwise cavo-tricuspid isthmus  dependent atrial flutter 2009 by Dr. Rayann Heman.  Marland Kitchen CAD (coronary artery disease) 06/2014   a. 06/2014: abnl nuc. Cath: Totally occluded LAD with faint collaterals (not a candidate for CTO PCI), 90% ramus s/p DES, 50-70% OM2.  . Chronic diastolic CHF (congestive heart failure) ([place])    a. 2D Echo 10/23/13: EF 50-55%, basal mid-inf HK, grade 2 DD, mild MR, mod dilated LA, no sig change from prior.  . CKD (chronic kidney disease), stage III   . Complication of anesthesia    " during my kidney stone surgery my heart went out of rhythm  . Compression fracture of L1 lumbar vertebra ([name])   . Diabetes ([place])   . Dyslipidemia   . Frequent PVCs   . GERD (gastroesophageal reflux disease)   . History of blood transfusion   . History of peptic ulcer disease   . HTN (hypertension)   . Hx of acute renal failure 01/02/10-3/24-11   due to hypovolemic shock, gastroenteritis and dehydration,hospitalized . Did requre a few days of dialysis. Cr at discharge was 1.8  . Kidney disease   . Kidney stone may 2009   Right hydronephrosis, S/P stone removal  . Lower back pain   . Macular degeneration   . OA (osteoarthritis) of hip   . Obesity   . Osteopenia   .  PAF (paroxysmal atrial fibrillation) ([name])    not on long term anticoagulation due to history of heme positive stools and anemia  . Shingles    episode  . Small bowel obstruction, partial 2009   Episode  . Ulcerative colitis ([place])    a. s/p total colectomy remotely.    PSH:  Past Surgical History:  Procedure Laterality Date  . BACK SURGERY    . CARDIAC CATHETERIZATION  06/19/2014  . COLON RESECTION    . CORONARY ANGIOPLASTY    . CORONARY STENT PLACEMENT  06/19/2014   DES       dr Martinique  . HERNIA REPAIR    . LEFT AND RIGHT HEART CATHETERIZATION WITH CORONARY ANGIOGRAM N/A 06/19/2014   Procedure: LEFT AND RIGHT HEART CATHETERIZATION WITH CORONARY ANGIOGRAM;  Surgeon: Peter M Martinique, MD;  Location: [facility] CATH LAB;  Service: Cardiovascular;  Laterality:  N/A;  . TONSILLECTOMY      Family history: Family History  Problem Relation Age of Onset  . Colon cancer Mother     Social history:  Social History   Social History  . Marital status: Widowed    Spouse name: N/A  . Number of children: N/A  . Years of education: N/A   Occupational History  . Not on file.   Social History Main Topics  . Smoking status: Never Smoker  . Smokeless tobacco: Never Used  . Alcohol use No  . Drug use: No  . Sexual activity: Not on file   Other Topics Concern  . Not on file   Social History Narrative  . No narrative on file    Current outpatient meds: Medications reviewed and reconciled.  No outpatient prescriptions have been marked as taking for the 01/04/17 encounter [facility] Encounter).    Current inpatient meds: Medications reviewed and reconciled.  Current Facility-Administered Medications  Medication Dose Route Frequency Provider Last Rate Last Dose  .  stroke: mapping our early stages of recovery book   Does not apply Once Radene Gunning, NP      . 0.9 %  sodium chloride infusion   Intravenous Continuous Radene Gunning, NP      . acetaminophen (TYLENOL) tablet 650 mg  650 mg Oral Q4H PRN Radene Gunning, NP       Or  . acetaminophen (TYLENOL) solution 650 mg  650 mg Per Tube Q4H PRN Radene Gunning, NP       Or  . acetaminophen (TYLENOL) suppository 650 mg  650 mg Rectal Q4H PRN Radene Gunning, NP      . amiodarone (PACERONE) tablet 200 mg  200 mg Oral Daily Lezlie Octave Black, NP      . aspirin tablet 325 mg  325 mg Oral Once Radene Gunning, NP      . enoxaparin (LOVENOX) injection 40 mg  40 mg Subcutaneous Q24H Lezlie Octave Black, NP      . famotidine (PEPCID) tablet 10 mg  10 mg Oral Daily Lezlie Octave Black, NP      . ferrous sulfate tablet 325 mg  325 mg Oral BID WC Radene Gunning, NP      . HYDROcodone-acetaminophen (NORCO/VICODIN) 5-325 MG per tablet 1 tablet  1 tablet Oral Q4H PRN Radene Gunning, NP      . insulin aspart (novoLOG) injection 0-5  Units  0-5 Units Subcutaneous QHS Lezlie Octave Black, NP      . insulin aspart (novoLOG) injection 0-9 Units  0-9 Units Subcutaneous TID  WC Lezlie Octave Black, NP      . pantoprazole (PROTONIX) EC tablet 40 mg  40 mg Oral Daily Lezlie Octave Black, NP      . senna-docusate (Senokot-S) tablet 1 tablet  1 tablet Oral QHS PRN Radene Gunning, NP      . simvastatin (ZOCOR) tablet 20 mg  20 mg Oral Daily Lezlie Octave Black, NP      . tamsulosin (FLOMAX) capsule 0.4 mg  0.4 mg Oral QPC supper Radene Gunning, NP        Allergies: Allergies  Allergen Reactions  . Brilinta [Ticagrelor]     Changed to Plavix due to SOB  . Penicillins Rash    Has patient had a PCN reaction causing immediate rash, facial/tongue/throat swelling, SOB or lightheadedness with hypotension:YES Has patient had a PCN reaction causing severe rash involving mucus membranes or skin necrosis: NO Has patient had a PCN reaction that required hospitalization No Has patient had a PCN reaction occurring within the last 10 years: NO If all of the above answers are "NO", then may proceed with Cephalosporin use.  . Sulfa Antibiotics Rash    ROS: As per HPI. A full 14-point review of systems was performed and is otherwise unremarkable.  PE:  BP (!) 165/66 (BP Location: Left Arm)   Pulse 77   Temp 97.5 F (36.4 C) (Oral)   Resp 19   SpO2 98%   General: WD obese Caucasian man lying in bed. He is in no acute distress. AAO x4. Speech clear, no dysarthria. No aphasia. Follows commands briskly. Affect is bright with congruent mood. Comportment is normal.  HEENT: Normocephalic. Neck supple without LAD. MMM, OP clear. Dentition good. Sclerae anicteric. No conjunctival injection.  CV: Regular, no murmur. Carotid pulses full and symmetric, no bruits. Distal pulses 2+ and symmetric.  Lungs: CTAB.  Abdomen: Soft, obese, non-distended, non-tender. Bowel sounds present x4. Colostomy in place.  Extremities: No cyanosis or clubbing. He has mild edema in BLE.  Neuro:   CN: Pupils are equal and round. They are symmetrically reactive from 3-->2 mm. Visual fields are full to confrontation. EOMI without nystagmus. No reported diplopia. Facial sensation is intact to light touch. Face is symmetric at rest with normal strength and mobility. Hearing is intact to conversational voice. Palate elevates symmetrically and uvula is midline. Voice is normal in tone, pitch and quality. Bilateral SCM and trapezii are 5/5. Tongue is midline with normal bulk and mobility.  Motor: Normal bulk, tone, Strength is normal in the arms with some giveway and decreased ROM in both shoulders 2/2 pain. He has 4/5 strength in B HF, 4+/5 otherwise in both legs with the exception of 4/5 L KF. No tremor or other abnormal movements. No drift.  Sensation: Intact to light touch. He has symmetrically decreased pinprick in a stocking-glove distribution. He also reports patchy and inconsistent decrease in pinprick of the R leg and L arm that do not fit any clear anatomic distribution. Vibration is absent in BLE. Joint position is severely impaired in BLE.  DTRs: 2+, symmetric in the arms with biceps and triceps brisker the brachioradialis. DTRs are absent in the legs. Toes downgoing bilaterally. Coordination: Finger-to-nose is without dysmetria. Finger taps are normal in amplitude and speed, no decrement.    Labs:  Lab Results  Component Value Date   WBC 6.5 01/04/2017   HGB 11.2 (L) 01/04/2017   HCT 33.0 (L) 01/04/2017   PLT 230 01/04/2017   GLUCOSE 146 (H) 01/04/2017  CHOL 139 07/18/2016   TRIG 140 07/18/2016   HDL 46 07/18/2016   LDLCALC 65 07/18/2016   ALT 14 (L) 01/04/2017   AST 19 01/04/2017   NA 137 01/04/2017   K 5.1 01/04/2017   CL 105 01/04/2017   CREATININE 1.60 (H) 01/04/2017   BUN 45 (H) 01/04/2017   CO2 20 (L) 01/04/2017   TSH 2.07 07/18/2016   INR 1.07 01/04/2017   HGBA1C 6.8 (H) 01/15/2016   UA negative Urine drug screen negative Ethanol <5 Coags normal CBC notable  for Hgb 10.9, Hct 34.3 CMP notable for CO2 20, glucose 148, BUN 31, creatinine 1.68, total protein 6.3, albumin 2.9 Troponin 0.02  Imaging:  I have personally and independently reviewed the MRI of the brain without contrast from today. This shows no evidence of acute ischemia. There is moderate diffuse chronic small vessel disease involving the bihemispheric white matter. 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   I have personally and independently reviewed the MRA of the head. This shows no large vessel occlusion or significant intracranial stenosis. Vessels are somewhat ectatic diffusely.   Assessment and Plan:  1. BLE weakness: This is chronic with recent fluctuations over the past couple of days culminating in a fall this morning. On exam he has generalized weakness in BLE that is fairly symmetric with the exception of slightly greater weakness with L knee flexion. Nothing lateralizes on my exam and his weakness does not fit any particular nerve or myotome distribution. This is most likely due to deconditioning. MRI confirms no acute stroke so I am canceling the TTE and carotid Dopplers that were ordered for a stroke evaluation. Recommend PT/rehab.   2. Diabetic polyneuropathy: This is chronic and severe. Ensure tight control of glucose. PT/rehab for gait training.   3. Abnormality of gait: This is likely multifactorial with significant contributions from deconditioning and neuropathy. PT/Rehab.   4. Cerebrovascular disease: This is chronic with no acute ischemia on MRI today. Known risk factors include CAD, DM, dyslipidemia, obesity, HTN, afib, age. Continue tight risk factor modification. Continue aspirin, statin. No need for acute stroke workup as he has not had a stroke.   The patient would likely benefit from acute rehab services. Recommend consultation of PT/OT with consideration for PM&R consult as appropriate depending upon clinical progress and therapists' recommendations.    Fall risk: Risk factors for falls include age, prior history of falls, use of assistive devices at home, polypharmacy, impaired mobility/gait, visual impairments, and orthopedic issues. Patient educated on risk of falls and use of call bell. Patient was counseled not to get up without assistance. Fall precautions. Limit psychoactive medications and sedating medications.  Delirium risk: Risk factors for delirium include age, co-morbid illness, medications/polypharmacy. Continue to optimize metabolic status as you are. Treat any infection aggressively. Minimize the use of opiates, benzos or any medication with strong anticholinergic properties as much as possible. Optimize sleep-wake cycles as much as you can by keeping the room bright with activity during the day and dark and quiet at night.   This was discussed with the patient and his sister. Education was provided on the diagnosis and expected evaluation and treatment. They are in agreement with the plan as noted. They were given the opportunity to ask any questions and these were addressed to their satisfaction.

## 2017-01-04 NOTE — ED Notes (Signed)
Patient is gone to xray

## 2017-01-04 NOTE — ED Notes (Signed)
Pt was able to stand, but didn't want to take any steps. Pt assisted back into bed.

## 2017-01-04 NOTE — ED Notes (Signed)
Patient transported to CT 

## 2017-01-04 NOTE — ED Notes (Signed)
On way to MRI

## 2017-01-04 NOTE — ED Notes (Signed)
Pt returned to room from imaging department.

## 2017-01-04 NOTE — H&P (Signed)
History and Physical    [name] [name] YPP:[id number] DOB: 09/11/1933 DOA: 01/04/2017  PCP: [name], MD Patient coming from: home  Chief Complaint: left leg weakness  HPI: [name] [name] is an irritable 83 y.o. male with medical history significant for diabetes, neuropathy, A. fib, ulcerative colitis status post colectomy/colostomy, chronic kidney disease, chronic diastolic heart failure, CAD, GI bleed, hypertension, anemia presents to the emergency Department chief complaint left leg weakness. Patient being admitted for stroke rule out.  Information is obtained from the patient. He reports he woke up around 1:30 this morning related to bathroom and noted that his left leg felt weaker than usual and more numb than usual. He used the toilet back to bed. At 6:30 awakened and it was the weakness and numbness to the left leg was worse and affected his ability to ambulate. At baseline he walks with a walker in the house and uses a cane otherwise. He does endorse some chronic numbness in his feet which causes him to feel unsteady but states that the left side is worse than usual. He denies headache visual disturbances difficulty chewing or swallowing. He denies chest pain palpitations shortness of breath diaphoresis.. Denies abdominal pain nausea vomiting diarrhea constipation. He denies dysuria hematuria frequency or urgency. He denies fever chills recent travel or sick contact   ED Course: In the emergency department he's afebrile hemodynamically stable and not hypoxic  Review of Systems: As per HPI otherwise 10 point review of systems negative.   Ambulatory Status: Ambulates with a cane and a walker.   Past Medical History:  Diagnosis Date  . Anemia   . Atrial flutter ([place])    a. s/p RFCA of counterclockwise cavo-tricuspid isthmus dependent atrial flutter 2009 by Dr. Rayann Heman.  Marland Kitchen CAD (coronary artery disease) 06/2014   a. 06/2014: abnl nuc. Cath: Totally occluded LAD with faint collaterals  (not a candidate for CTO PCI), 90% ramus s/p DES, 50-70% OM2.  . Chronic diastolic CHF (congestive heart failure) ([place])    a. 2D Echo 10/23/13: EF 50-55%, basal mid-inf HK, grade 2 DD, mild MR, mod dilated LA, no sig change from prior.  . CKD (chronic kidney disease), stage III   . Complication of anesthesia    " during my kidney stone surgery my heart went out of rhythm  . Compression fracture of L1 lumbar vertebra ([name])   . Diabetes ([place])   . Dyslipidemia   . Frequent PVCs   . GERD (gastroesophageal reflux disease)   . History of blood transfusion   . History of peptic ulcer disease   . HTN (hypertension)   . Hx of acute renal failure 01/02/10-3/24-11   due to hypovolemic shock, gastroenteritis and dehydration,hospitalized . Did requre a few days of dialysis. Cr at discharge was 1.8  . Kidney disease   . Kidney stone may 2009   Right hydronephrosis, S/P stone removal  . Lower back pain   . Macular degeneration   . OA (osteoarthritis) of hip   . Obesity   . Osteopenia   . PAF (paroxysmal atrial fibrillation) ([name])    not on long term anticoagulation due to history of heme positive stools and anemia  . Shingles    episode  . Small bowel obstruction, partial 2009   Episode  . Ulcerative colitis ([place])    a. s/p total colectomy remotely.    Past Surgical History:  Procedure Laterality Date  . BACK SURGERY    . CARDIAC CATHETERIZATION  06/19/2014  . COLON  RESECTION    . CORONARY ANGIOPLASTY    . CORONARY STENT PLACEMENT  06/19/2014   DES       dr Martinique  . HERNIA REPAIR    . LEFT AND RIGHT HEART CATHETERIZATION WITH CORONARY ANGIOGRAM N/A 06/19/2014   Procedure: LEFT AND RIGHT HEART CATHETERIZATION WITH CORONARY ANGIOGRAM;  Surgeon: Peter M Martinique, MD;  Location: [facility] CATH LAB;  Service: Cardiovascular;  Laterality: N/A;  . TONSILLECTOMY      Social History   Social History  . Marital status: Widowed    Spouse name: N/A  . Number of children: N/A  . Years of education: N/A    Occupational History  . Not on file.   Social History Main Topics  . Smoking status: Never Smoker  . Smokeless tobacco: Never Used  . Alcohol use No  . Drug use: No  . Sexual activity: Not on file   Other Topics Concern  . Not on file   Social History Narrative  . No narrative on file    Allergies  Allergen Reactions  . Brilinta [Ticagrelor]     Changed to Plavix due to SOB  . Penicillins Rash    Has patient had a PCN reaction causing immediate rash, facial/tongue/throat swelling, SOB or lightheadedness with hypotension:YES Has patient had a PCN reaction causing severe rash involving mucus membranes or skin necrosis: NO Has patient had a PCN reaction that required hospitalization No Has patient had a PCN reaction occurring within the last 10 years: NO If all of the above answers are "NO", then may proceed with Cephalosporin use.  . Sulfa Antibiotics Rash    Family History  Problem Relation Age of Onset  . Colon cancer Mother     Prior to Admission medications   Medication Sig Start Date End Date Taking? Authorizing Provider  acetaminophen (TYLENOL) 500 MG tablet Take 1 tablet (500 mg total) by mouth 3 (three) times daily. Available over the counter 01/17/16   Ripudeep K Rai, MD  amiodarone (PACERONE) 200 MG tablet TAKE 1 TABLET (200 MG TOTAL) BY MOUTH DAILY. 10/03/16   Sueanne Margarita, MD  aspirin 81 MG tablet Take 81 mg by mouth daily.    Historical Provider, MD  calcium carbonate (OS-CAL) 600 MG TABS tablet Take 600 mg by mouth daily with breakfast.    Historical Provider, MD  cholecalciferol (VITAMIN D) 1000 units tablet Take 3,000 Units by mouth daily.    Historical Provider, MD  ferrous sulfate 325 (65 FE) MG tablet Take 1 tablet (325 mg total) by mouth 2 (two) times daily with a meal. 10/27/14   Florencia Reasons, MD  HYDROcodone-acetaminophen (NORCO/VICODIN) 5-325 MG tablet Take 1 tablet by mouth every 4 (four) hours as needed. 02/07/16   Isla Pence, MD  metoprolol  tartrate (LOPRESSOR) 25 MG tablet Take 0.5 tablets (12.5 mg total) by mouth 2 (two) times daily. 12/26/14   Sueanne Margarita, MD  omeprazole (PRILOSEC) 20 MG capsule TAKE 1 CAPSULE (20 MG TOTAL) BY MOUTH DAILY. 12/01/15   Irene Shipper, MD  pioglitazone (ACTOS) 30 MG tablet Take 1 tablet (30 mg total) by mouth daily. 11/05/14   Dayna N Dunn, PA-C  ranitidine (ZANTAC) 150 MG tablet Take 150 mg by mouth every morning.    Historical Provider, MD  saxagliptin HCl (ONGLYZA) 5 MG TABS tablet Take 1 tablet (5 mg total) by mouth daily. 06/20/14   Tanda Rockers, MD  simvastatin (ZOCOR) 20 MG tablet Take 20 mg by mouth daily.  Historical Provider, MD  tamsulosin (FLOMAX) 0.4 MG CAPS capsule Take 1 capsule (0.4 mg total) by mouth daily after supper. 10/27/14   Florencia Reasons, MD    Physical Exam: Vitals:   01/04/17 0959 01/04/17 1000 01/04/17 1100 01/04/17 1102  BP: (!) 166/65 (!) 166/65 (!) 153/75   Pulse: 83 81 76   Resp: 16 (!) 31 20   Temp:    97.3 F (36.3 C)  TempSrc:      SpO2: 100% 100% 100%      General:  Appears calm But irritable not uncomfortable Eyes:  PERRL, EOMI, normal lids, iris ENT:  grossly normal hearing, lips & tongue, mucous membranes of his mouth are pink and moist Neck:  no LAD, masses or thyromegaly Cardiovascular:  RRR, no m/r/g. trace LE edema.  Respiratory:  CTA bilaterally, no w/r/r. Normal respiratory effort. Abdomen:  ntnd, obese soft positive bowel sounds no guarding or rebounding Skin:  no rash or induration seen on limited exam Musculoskeletal:  grossly normal tone BUE/BLE, good ROM, no bony abnormality Psychiatric:  grossly normal mood and affect, speech fluent and appropriate, AOx3 Neurologic:  CN 2-12 grossly intact, moves all extremities in coordinated fashion, sensation intact speech clear facial symmetry bilateral grip 5 out of 5 wrist remedy strength 5 out of 5 sensation intact bilaterally to lower extremities  Labs on Admission: I have personally reviewed following  labs and imaging studies  CBC:  Recent Labs Lab 01/04/17 1015 01/04/17 1023  WBC 6.5  --   NEUTROABS 4.9  --   HGB 10.9* 11.2*  HCT 34.3* 33.0*  MCV 94.5  --   PLT 230  --    Basic Metabolic Panel:  Recent Labs Lab 01/04/17 1015 01/04/17 1023  NA 138 137  K 5.0 5.1  CL 106 105  CO2 20*  --   GLUCOSE 148* 146*  BUN 31* 45*  CREATININE 1.68* 1.60*  CALCIUM 8.9  --    GFR: CrCl cannot be calculated (Unknown ideal weight.). Liver Function Tests:  Recent Labs Lab 01/04/17 1015  AST 19  ALT 14*  ALKPHOS 87  BILITOT 0.6  PROT 6.3*  ALBUMIN 2.9*   No results for input(s): LIPASE, AMYLASE in the last 168 hours. No results for input(s): AMMONIA in the last 168 hours. Coagulation Profile:  Recent Labs Lab 01/04/17 1015  INR 1.07   Cardiac Enzymes: No results for input(s): CKTOTAL, CKMB, CKMBINDEX, TROPONINI in the last 168 hours. BNP (last 3 results) No results for input(s): PROBNP in the last 8760 hours. HbA1C: No results for input(s): HGBA1C in the last 72 hours. CBG: No results for input(s): GLUCAP in the last 168 hours. Lipid Profile: No results for input(s): CHOL, HDL, LDLCALC, TRIG, CHOLHDL, LDLDIRECT in the last 72 hours. Thyroid Function Tests: No results for input(s): TSH, T4TOTAL, FREET4, T3FREE, THYROIDAB in the last 72 hours. Anemia Panel: No results for input(s): VITAMINB12, FOLATE, FERRITIN, TIBC, IRON, RETICCTPCT in the last 72 hours. Urine analysis:    Component Value Date/Time   COLORURINE YELLOW 10/22/2014 1052   APPEARANCEUR CLOUDY (A) 10/22/2014 1052   LABSPEC 1.021 10/22/2014 1052   PHURINE 5.0 10/22/2014 1052   GLUCOSEU NEGATIVE 10/22/2014 1052   HGBUR LARGE (A) 10/22/2014 1052   BILIRUBINUR NEGATIVE 10/22/2014 1052   KETONESUR NEGATIVE 10/22/2014 1052   PROTEINUR NEGATIVE 10/22/2014 1052   UROBILINOGEN 0.2 10/22/2014 1052   NITRITE NEGATIVE 10/22/2014 1052   LEUKOCYTESUR NEGATIVE 10/22/2014 1052    Creatinine  Clearance: CrCl cannot be  calculated (Unknown ideal weight.).  Sepsis Labs: @LABRCNTIP (procalcitonin:4,lacticidven:4) )No results found for this or any previous visit (from the past 240 hour(s)).   Radiological Exams on Admission: Ct Head Wo Contrast  Result Date: 01/04/2017 CLINICAL DATA:  Leg weakness.  Fall today. EXAM: CT HEAD WITHOUT CONTRAST TECHNIQUE: Contiguous axial images were obtained from the base of the skull through the vertex without intravenous contrast. COMPARISON:  01/14/2016 FINDINGS: Brain: No acute intracranial abnormality. Specifically, no hemorrhage, hydrocephalus, mass lesion, acute infarction, or significant intracranial injury. Vascular: No hyperdense vessel or unexpected calcification. Skull: No acute calvarial abnormality. Sinuses/Orbits: Mucosal thickening in the paranasal sinuses. Old left medial orbital wall blowout fracture an nasal bone fractures. Mastoid air cells are clear. Other: None IMPRESSION: No acute intracranial abnormality. Electronically Signed   By: Rolm Baptise M.D.   On: 01/04/2017 10:48    EKG: Independently reviewed. Sinus rhythm Short PR interval Probable left atrial enlargement RBBB and LAFB Left ventricular hypertrophy  Assessment/Plan Principal Problem:   Left-sided weakness Active Problems:   Diabetes ([name])   Obesity   Ulcerative colitis ([place])   Benign essential HTN   CAD in native artery- total LAD, s/p RI DES 06/19/14   Dyslipidemia   Anemia   CKD (chronic kidney disease), stage III   Chronic diastolic CHF (congestive heart failure) ([name])   Paroxysmal atrial fibrillation ([place])    #1. Left-sided weakness. Concern for stroke/TIA. History of diabetes, CAD, CHF, hypertension, obesity, hyperlipidemia. CT of the head no acute intracranial abnormality. -Admit to telemetry -Obtain an MRI and MRA -2-D echo and carotid Dopplers -Hg A1c and a lipid panel -Aspirin -Continue statin -physcal therapy occupational therapy speech  therapy -neuro consult  3. A. fib. [place] score 5. EKG as noted above. Not on anticoagulation due to history of GI bleed. Rate controlled -Continue home meds -Monitor  3. History of diastolic heart failure. appears compensated. Echo done in 2017 reveals EF of 45% with grade 1 diastolic dysfunction. Chest x-ray pending. Home medications amiodarone, metoprolol -Continue home meds -Monitor intake and output -Obtain daily weights  #4. CAD status post drug-eluting stent in 2015. Denies any chest pain. Home meds include low-dose aspirin beta blocker and statin. Initial troponin negative. EKG as noted above -Continue home meds  #5. Chronic kidney disease stage III. Creatinine 1.6. This appears to be stable at baseline. -Monitor  6. Diabetes type 2. On oral agents. Serum glucose 146 on admission. -Hold oral agents for now -Obtain hemoglobin A1c -Sliding scale insulin for optimal control  7. Anemia/normocytic. chronic in setting of hx gi bleed. Hg 11.2. No s/sx bleeding  #8. Hypertension. Fair control in the emergency department. Home meds as noted above -Continue home meds  #8. Ulcerative colitis. Status post colectomy with colostomy remotely. Stable at baseline    DVT prophylaxis: lovenox Code Status: full  Family Communication: sister at bedside  Disposition Plan: home   Consults called: oster neuro  Admission status: obs    Dyanne Carrel M MD [facility]  If 7PM-7AM, please contact night-coverage [email or web address] Password [facility]  01/04/2017, 11:32 AM

## 2017-01-05 DIAGNOSIS — F0390 Unspecified dementia without behavioral disturbance: Secondary | ICD-10-CM | POA: Diagnosis not present

## 2017-01-05 DIAGNOSIS — E785 Hyperlipidemia, unspecified: Secondary | ICD-10-CM | POA: Diagnosis not present

## 2017-01-05 DIAGNOSIS — I130 Hypertensive heart and chronic kidney disease with heart failure and stage 1 through stage 4 chronic kidney disease, or unspecified chronic kidney disease: Secondary | ICD-10-CM | POA: Diagnosis present

## 2017-01-05 DIAGNOSIS — E1121 Type 2 diabetes mellitus with diabetic nephropathy: Secondary | ICD-10-CM | POA: Diagnosis present

## 2017-01-05 DIAGNOSIS — I10 Essential (primary) hypertension: Secondary | ICD-10-CM | POA: Diagnosis not present

## 2017-01-05 DIAGNOSIS — Z7982 Long term (current) use of aspirin: Secondary | ICD-10-CM | POA: Diagnosis not present

## 2017-01-05 DIAGNOSIS — R54 Age-related physical debility: Secondary | ICD-10-CM | POA: Diagnosis not present

## 2017-01-05 DIAGNOSIS — E1122 Type 2 diabetes mellitus with diabetic chronic kidney disease: Secondary | ICD-10-CM | POA: Diagnosis present

## 2017-01-05 DIAGNOSIS — N183 Chronic kidney disease, stage 3 (moderate): Secondary | ICD-10-CM | POA: Diagnosis not present

## 2017-01-05 DIAGNOSIS — Z9049 Acquired absence of other specified parts of digestive tract: Secondary | ICD-10-CM | POA: Diagnosis not present

## 2017-01-05 DIAGNOSIS — E0842 Diabetes mellitus due to underlying condition with diabetic polyneuropathy: Secondary | ICD-10-CM | POA: Diagnosis not present

## 2017-01-05 DIAGNOSIS — Z955 Presence of coronary angioplasty implant and graft: Secondary | ICD-10-CM | POA: Diagnosis not present

## 2017-01-05 DIAGNOSIS — Z66 Do not resuscitate: Secondary | ICD-10-CM | POA: Diagnosis present

## 2017-01-05 DIAGNOSIS — R278 Other lack of coordination: Secondary | ICD-10-CM | POA: Diagnosis not present

## 2017-01-05 DIAGNOSIS — R2689 Other abnormalities of gait and mobility: Secondary | ICD-10-CM | POA: Diagnosis not present

## 2017-01-05 DIAGNOSIS — E1142 Type 2 diabetes mellitus with diabetic polyneuropathy: Secondary | ICD-10-CM | POA: Diagnosis not present

## 2017-01-05 DIAGNOSIS — E1151 Type 2 diabetes mellitus with diabetic peripheral angiopathy without gangrene: Secondary | ICD-10-CM | POA: Diagnosis present

## 2017-01-05 DIAGNOSIS — Z7984 Long term (current) use of oral hypoglycemic drugs: Secondary | ICD-10-CM | POA: Diagnosis not present

## 2017-01-05 DIAGNOSIS — E1129 Type 2 diabetes mellitus with other diabetic kidney complication: Secondary | ICD-10-CM | POA: Diagnosis not present

## 2017-01-05 DIAGNOSIS — R531 Weakness: Secondary | ICD-10-CM | POA: Diagnosis not present

## 2017-01-05 DIAGNOSIS — E669 Obesity, unspecified: Secondary | ICD-10-CM | POA: Diagnosis present

## 2017-01-05 DIAGNOSIS — K5190 Ulcerative colitis, unspecified, without complications: Secondary | ICD-10-CM | POA: Diagnosis present

## 2017-01-05 DIAGNOSIS — R41841 Cognitive communication deficit: Secondary | ICD-10-CM | POA: Diagnosis not present

## 2017-01-05 DIAGNOSIS — R269 Unspecified abnormalities of gait and mobility: Secondary | ICD-10-CM | POA: Diagnosis not present

## 2017-01-05 DIAGNOSIS — Z933 Colostomy status: Secondary | ICD-10-CM | POA: Diagnosis not present

## 2017-01-05 DIAGNOSIS — N320 Bladder-neck obstruction: Secondary | ICD-10-CM | POA: Diagnosis not present

## 2017-01-05 DIAGNOSIS — I480 Paroxysmal atrial fibrillation: Secondary | ICD-10-CM | POA: Diagnosis present

## 2017-01-05 DIAGNOSIS — R2681 Unsteadiness on feet: Secondary | ICD-10-CM | POA: Diagnosis not present

## 2017-01-05 DIAGNOSIS — I2510 Atherosclerotic heart disease of native coronary artery without angina pectoris: Secondary | ICD-10-CM | POA: Diagnosis not present

## 2017-01-05 DIAGNOSIS — H3530 Unspecified macular degeneration: Secondary | ICD-10-CM | POA: Diagnosis present

## 2017-01-05 DIAGNOSIS — R29898 Other symptoms and signs involving the musculoskeletal system: Secondary | ICD-10-CM | POA: Diagnosis not present

## 2017-01-05 DIAGNOSIS — R454 Irritability and anger: Secondary | ICD-10-CM | POA: Diagnosis present

## 2017-01-05 DIAGNOSIS — M6281 Muscle weakness (generalized): Secondary | ICD-10-CM | POA: Diagnosis not present

## 2017-01-05 DIAGNOSIS — D631 Anemia in chronic kidney disease: Secondary | ICD-10-CM | POA: Diagnosis present

## 2017-01-05 DIAGNOSIS — I679 Cerebrovascular disease, unspecified: Secondary | ICD-10-CM | POA: Diagnosis not present

## 2017-01-05 DIAGNOSIS — I5043 Acute on chronic combined systolic (congestive) and diastolic (congestive) heart failure: Secondary | ICD-10-CM | POA: Diagnosis present

## 2017-01-05 DIAGNOSIS — I482 Chronic atrial fibrillation: Secondary | ICD-10-CM | POA: Diagnosis not present

## 2017-01-05 DIAGNOSIS — D6489 Other specified anemias: Secondary | ICD-10-CM | POA: Diagnosis not present

## 2017-01-05 DIAGNOSIS — K219 Gastro-esophageal reflux disease without esophagitis: Secondary | ICD-10-CM | POA: Diagnosis present

## 2017-01-05 DIAGNOSIS — Z86711 Personal history of pulmonary embolism: Secondary | ICD-10-CM | POA: Diagnosis not present

## 2017-01-05 LAB — LIPID PANEL
Cholesterol: 135 mg/dL (ref 0–200)
HDL: 40 mg/dL — ABNORMAL LOW (ref >40)
LDL Cholesterol: 68 mg/dL (ref 0–99)
Total CHOL/HDL Ratio: 3.4 RATIO
Triglycerides: 137 mg/dL (ref <150)
VLDL: 27 mg/dL (ref 0–40)

## 2017-01-05 LAB — BASIC METABOLIC PANEL
Anion gap: 6 (ref 5–15)
BUN: 28 mg/dL — ABNORMAL HIGH (ref 6–20)
CO2: 23 mmol/L (ref 22–32)
Calcium: 8.2 mg/dL — ABNORMAL LOW (ref 8.9–10.3)
Chloride: 105 mmol/L (ref 101–111)
Creatinine, Ser: 1.56 mg/dL — ABNORMAL HIGH (ref 0.61–1.24)
GFR calc Af Amer: 46 mL/min — ABNORMAL LOW (ref >60)
GFR calc non Af Amer: 40 mL/min — ABNORMAL LOW (ref >60)
Glucose, Bld: 108 mg/dL — ABNORMAL HIGH (ref 65–99)
Potassium: 4.7 mmol/L (ref 3.5–5.1)
Sodium: 134 mmol/L — ABNORMAL LOW (ref 135–145)

## 2017-01-05 LAB — HEMOGLOBIN A1C
Hgb A1c MFr Bld: 6.5 % — ABNORMAL HIGH (ref 4.8–5.6)
Mean Plasma Glucose: 140 mg/dL

## 2017-01-05 LAB — GLUCOSE, CAPILLARY
Glucose-Capillary: 115 mg/dL — ABNORMAL HIGH (ref 65–99)
Glucose-Capillary: 137 mg/dL — ABNORMAL HIGH (ref 65–99)
Glucose-Capillary: 153 mg/dL — ABNORMAL HIGH (ref 65–99)
Glucose-Capillary: 131 mg/dL — ABNORMAL HIGH (ref 65–99)

## 2017-01-05 LAB — CBC
HCT: 30.5 % — ABNORMAL LOW (ref 39.0–52.0)
Hemoglobin: 9.7 g/dL — ABNORMAL LOW (ref 13.0–17.0)
MCH: 30.1 pg (ref 26.0–34.0)
MCHC: 31.8 g/dL (ref 30.0–36.0)
MCV: 94.7 fL (ref 78.0–100.0)
Platelets: 202 10*3/uL (ref 150–400)
RBC: 3.22 MIL/uL — ABNORMAL LOW (ref 4.22–5.81)
RDW: 14.9 % (ref 11.5–15.5)
WBC: 6.2 10*3/uL (ref 4.0–10.5)

## 2017-01-05 MED ORDER — METOPROLOL TARTRATE 12.5 MG HALF TABLET
12.5000 mg | ORAL_TABLET | Freq: Two times a day (BID) | ORAL | Status: DC
  Administered 2017-01-06 – 2017-01-07 (×2): 12.5 mg via ORAL
  Filled 2017-01-05 (×4): qty 1

## 2017-01-05 NOTE — Progress Notes (Signed)
RN offered to change patient's colostomy bag multiple times today but patient declined./ he will notified staffs if need to be change./   Aidynn Polendo, RN

## 2017-01-05 NOTE — Progress Notes (Signed)
Neurology Progress Note  Subjective: The patient personally has some discomfort in both legs. He states that this is his chronic baseline and this is unchanged this morning. He describes a vague discomfort in the muscles of both legs, not really cramping, not worsened with movement, unable to further characterize. The burning in the bottoms of his feet has been okay since he has been standing up very much. He says he did not sleep well last night because he had to get up to urinate and because of nursing checks. 12-pt review of systems is otherwise unremarkable.  Medications reviewed and reconciled.   Current Meds:   Current Facility-Administered Medications:  .   stroke: mapping our early stages of recovery book, , Does not apply, Once, Karen M Black, NP .  0.9 %  sodium chloride infusion, , Intravenous, Continuous, Lezlie Octave Black, NP, Last Rate: 50 mL/hr at 01/04/17 2353 .  acetaminophen (TYLENOL) tablet 650 mg, 650 mg, Oral, Q4H PRN **OR** acetaminophen (TYLENOL) solution 650 mg, 650 mg, Per Tube, Q4H PRN **OR** acetaminophen (TYLENOL) suppository 650 mg, 650 mg, Rectal, Q4H PRN, Lezlie Octave Black, NP .  amiodarone (PACERONE) tablet 200 mg, 200 mg, Oral, Daily, Lezlie Octave Black, NP, 200 mg at 01/04/17 2337 .  enoxaparin (LOVENOX) injection 40 mg, 40 mg, Subcutaneous, Q24H, Lezlie Octave Black, NP, 40 mg at 01/04/17 1659 .  famotidine (PEPCID) tablet 10 mg, 10 mg, Oral, Daily, Lezlie Octave Black, NP, 10 mg at 01/04/17 2336 .  ferrous sulfate tablet 325 mg, 325 mg, Oral, BID WC, Lezlie Octave Black, NP .  HYDROcodone-acetaminophen (NORCO/VICODIN) 5-325 MG per tablet 1 tablet, 1 tablet, Oral, Q4H PRN, Lezlie Octave Black, NP .  insulin aspart (novoLOG) injection 0-5 Units, 0-5 Units, Subcutaneous, QHS, Lezlie Octave Black, NP .  insulin aspart (novoLOG) injection 0-9 Units, 0-9 Units, Subcutaneous, TID WC, Lezlie Octave Black, NP .  pantoprazole (PROTONIX) EC tablet 40 mg, 40 mg, Oral, Daily, Lezlie Octave Black, NP, 40 mg at 01/04/17 2337 .   senna-docusate (Senokot-S) tablet 1 tablet, 1 tablet, Oral, QHS PRN, Radene Gunning, NP .  simvastatin (ZOCOR) tablet 20 mg, 20 mg, Oral, q1800, Radene Gunning, NP, 20 mg at 01/04/17 2338 .  tamsulosin (FLOMAX) capsule 0.4 mg, 0.4 mg, Oral, QPC supper, Radene Gunning, NP, 0.4 mg at 01/04/17 2230  Objective:  Temp:  [97.2 F (36.2 C)-98.2 F (36.8 C)] 98 F (36.7 C) (03/22 0230) Pulse Rate:  [57-85] 59 (03/22 0500) Resp:  [14-31] 16 (03/22 0500) BP: (113-166)/(41-78) 121/43 (03/22 0500) SpO2:  [97 %-100 %] 98 % (03/22 0500)  General: WDWN obese Caucasian man lying in bed in NAD. Alert, oriented x4. Speech is clear without dysarthria. Affect is bright. Comportment is normal.  HEENT: Neck is supple without lymphadenopathy. Mucous membranes are moist and the oropharynx is clear. Sclerae are anicteric. There is no conjunctival injection.  CV: Regular, no murmur. Carotid pulses are 2+ and symmetric with no bruits. Distal pulses 2+ and symmetric.  Lungs: CTAB   Neuro: MS: As noted above.  CN: Pupils are equal and reactive from 3-->2 mm bilaterally. EOMI, no nystagmus. He has some breakup of smooth pursuits in all directions of gaze. Facial sensation is intact to light touch. Face is symmetric at rest with normal strength and mobility. Hearing is intact to conversational voice. Voice is normal in tone and quality. Palate elevates symmetrically. Uvula is midline. Bilateral SCM and trapezii are 5/5. Tongue is midline with normal bulk and mobility.  Motor: Normal bulk, tone throughout. Strength is upper extremities is normal with the exception of some giveaway in the shoulders (left worse than right) due to pain. He has mild weakness in both lower extremities, worse proximally. This is symmetric on today's examination. No pronator drift. No tremor or other abnormal movements are observed.  Sensation: Stocking-glove distribution neuropathy is unchanged.  DTRs: 2+, symmetric in the biceps, triceps.  Brachioradialis is diminished but symmetric. Reflexes are absent in both lower extremities.. Toes are downgoing bilaterally. No pathological reflexes.  Coordination: Finger-to-nose is somewhat limited by shoulder discomfort but without dysmetria bilaterally.    Labs: Lab Results  Component Value Date   WBC 6.2 01/05/2017   HGB 9.7 (L) 01/05/2017   HCT 30.5 (L) 01/05/2017   PLT 202 01/05/2017   GLUCOSE 108 (H) 01/05/2017   CHOL 135 01/05/2017   TRIG 137 01/05/2017   HDL 40 (L) 01/05/2017   LDLCALC 68 01/05/2017   ALT 14 (L) 01/04/2017   AST 19 01/04/2017   NA 134 (L) 01/05/2017   K 4.7 01/05/2017   CL 105 01/05/2017   CREATININE 1.56 (H) 01/05/2017   BUN 28 (H) 01/05/2017   CO2 23 01/05/2017   TSH 2.07 07/18/2016   INR 1.07 01/04/2017   HGBA1C 6.5 (H) 01/04/2017   CBC Latest Ref Rng & Units 01/05/2017 01/04/2017 01/04/2017  WBC 4.0 - 10.5 K/uL 6.2 - 6.5  Hemoglobin 13.0 - 17.0 g/dL 9.7(L) 11.2(L) 10.9(L)  Hematocrit 39.0 - 52.0 % 30.5(L) 33.0(L) 34.3(L)  Platelets 150 - 400 K/uL 202 - 230    Lab Results  Component Value Date   HGBA1C 6.5 (H) 01/04/2017   Lab Results  Component Value Date   ALT 14 (L) 01/04/2017   AST 19 01/04/2017   ALKPHOS 87 01/04/2017   BILITOT 0.6 01/04/2017    Radiology:  There is no new neuroimaging.  A/P:   1. Bilateral lower extremity weakness: This is chronic, consistent with deconditioning with possible contribution from severe underlying polyneuropathy. There is nothing to support an acute stroke in this setting and his examination does not support spinal pathology at this time so would defer further imaging. Recommend physical therapy and rehabilitation.  2. Diabetic polyneuropathy: This is chronic and severe. Continue tight glucose control. PT/rehabilitation for gait training. He may benefit from a trial of gabapentin given his burning pain in both feet. He is not sure if this is been tried before, the I suspect it has been at some point.  Regardless, a retrial should be considered.  3. Abnormality of gait: Multifactorial in etiology with main contributions from deconditioning and neuropathy. PT/rehabilitation.  4. Cerebrovascular disease: No acute issues. He has chronic small vessel change on MRI scan. Continue with risk factor modification. Continue aspirin and statin.  The patient would likely benefit from acute rehab services. Recommend consultation of PT/OT with consideration for PM&R consult as appropriate depending upon clinical progress and therapists' recommendations.   Fall risk: Risk factors for falls include age, prior history of falls, use of assistive devices at home, polypharmacy, impaired mobility/gait, visual impairments, and orthopedic issues. Patient educated on risk of falls and use of call bell. Patient was counseled not to get up without assistance. Fall precautions. Limit psychoactive medications and sedating medications.  Delirium risk: Risk factors for delirium include age, co-morbid illness, medications/polypharmacy. Continue to optimize metabolic status as you are. Treat any infection aggressively. Minimize the use of opiates, benzos or any medication with strong anticholinergic properties as much as possible.  Optimize sleep-wake cycles as much as you can by keeping the room bright with activity during the day and dark and quiet at night.   This was discussed with the patient. Education was provided on the diagnosis and expected evaluation and treatment. He is in agreement with the plan as noted. He was given the opportunity to ask any questions and these were addressed to his satisfaction.   I have no additional recommendations at this time and will sign off. Please call if any new issues should arise.  Melba Coon, MD [facility]

## 2017-01-05 NOTE — Progress Notes (Signed)
Occupational Therapy Evaluation Patient Details Name: [name] [name] MRN: [id number] DOB: Aug 19, 1933 Today's Date: 01/05/2017    History of Present Illness Pt is 83 y.o. M admitted with left sided weakness 2/2 fall. PMH significant for DM, obesity, HTN, CAD, CHF, CKD, a-fib, colostomy, and polyneuropathy.    Clinical Impression   PTA, pt lived alone and was modified independent with ADL and mobility. Pt states since his last fall, he has had more difficulty using his L shoulder and has started using his RW at times. Pt drives and enjoys going out into the community. Feel pt would benefit from skilled OT services at SNF to maximize his functional level of independence. If SNF is not an option, recommend pt follow up with outpt OT at the neuro outpt center. Discussed plan with pt and he is in agreement. Will follow acutely to address established goals and facilitate safe DC to next venue of care.     Follow Up Recommendations  SNF    Equipment Recommendations  None recommended by OT    Recommendations for Other Services  follow up with ortho MD regarding L shoulder     Precautions / Restrictions Precautions Precautions: Fall      Mobility Bed Mobility               General bed mobility comments: OOB in chair  Transfers Overall transfer level: Needs assistance Equipment used: Rolling walker (2 wheeled) Transfers: Sit to/from Stand Sit to Stand: Supervision         General transfer comment: Able to stand from chair with use of armrests. Pt states he had a lift chair at home. States he was not using a RW until @ 1 week ago.    Balance Overall balance assessment: History of Falls;Needs assistance   Sitting balance-[name]: Good       Standing balance-[name]: Fair                              ADL Overall ADL's : Needs assistance/impaired     Grooming: Minimal assistance Grooming Details (indicate cue type and reason): difficulty reaching top  of head. Fell when trying to comb hair Upper Body Bathing: Minimal assistance;Sitting   Lower Body Bathing: Supervison/ safety;Set up   Upper Body Dressing : Moderate assistance Upper Body Dressing Details (indicate cue type and reason): Pt states he is able to get a short sleeve shirt on but unable to get a long sleeve shirt on or coat due to his shoulders Lower Body Dressing: Supervision/safety;Set up Lower Body Dressing Details (indicate cue type and reason): Pt able to complete tasks with great difficulty     Toileting- Clothing Manipulation and Hygiene: Supervision/safety;Set up;Sit to/from stand Toileting - Clothing Manipulation Details (indicate cue type and reason): emptys colostomy bag in standing.      Functional mobility during ADLs: Supervision/safety;Rolling walker General ADL Comments: Pt states it takes him over an hour to get bathed and dressed. "longer now that I have more difficulty with my shoulder".      Vision Baseline Vision/History: Wears glasses Wears Glasses: Reading only Vision Assessment?: No apparent visual deficits     Perception     Praxis      Pertinent Vitals/Pain Pain Assessment: 0-10 Pain Score: 8  Pain Location: Bilateral legs  Pain Descriptors / Indicators: Sore;Heaviness Pain Intervention(s): Monitored during session;Limited activity within patient's tolerance     Hand Dominance Right   Extremity/Trunk  Assessment Upper Extremity Assessment Upper Extremity Assessment: Defer to OT evaluation RUE Deficits / Details: apparent RTC deficits./ compensates with hiking shoulder and using scapular movement to abduct arm @ 80./ unable to move into shoulder flexion./ RUE Coordination: decreased gross motor LUE Deficits / Details: Unable to flex shoulder./ Able to abduct @ 30 degrees . Poor functional use of L shoulder - more difficult over the last 3 weeks./ LUE Coordination: decreased gross motor   Lower Extremity Assessment Lower Extremity  Assessment: RLE deficits/detail;LLE deficits/detail RLE Deficits / Details: Hip Flexion 3+/5, Knee extension 4-/5, Knee Flexion 4-/5, DF 4-/5  RLE Sensation: decreased light touch;history of peripheral neuropathy RLE Coordination:  (coordination intact) LLE Deficits / Details: Hip Flexion 3+/5, Knee extension 4-/5, Knee Flexion 4-/5, DF 4-/5  LLE Sensation: decreased light touch;history of peripheral neuropathy LLE Coordination:  (coordination intact )   Cervical / Trunk Assessment Cervical / Trunk Assessment: Kyphotic   Communication Communication Communication: No difficulties   Cognition Arousal/Alertness: Awake/alert Behavior During Therapy: WFL for tasks assessed/performed Overall Cognitive Status: Within Functional Limits for tasks assessed                     General Comments       Exercises       Shoulder Instructions      Home Living Family/patient expects to be discharged to:: Private residence Living Arrangements: Alone Available Help at Discharge: Family;Available PRN/intermittently (but he doesn't want to burden) Type of Home: House Home Access: Stairs to enter;Ramped entrance (ramp on the back of the house) Entrance Stairs-Number of Steps: 1 Entrance Stairs-Rails: None Home Layout: One level     Bathroom Shower/Tub: Walk-in shower (bars in the shower)   Bathroom Toilet: Standard (handicap bars) Bathroom Accessibility: Yes How Accessible: Accessible via walker (1 bathroom is accessible) Home Equipment: Walker - 2 wheels;Walker - 4 wheels;Cane - single point;Bedside commode (BSC was wife's)          Prior Functioning/Environment Level of Independence: Independent with assistive device(s)        Comments: used the walker to get down the stairs and used the cane in community, driving, makes dinner,  (plans to have ramp built at other entrance)        OT Problem List: Decreased strength;Decreased range of motion;Decreased activity  tolerance;Impaired balance (sitting and/or standing);Decreased coordination;Decreased knowledge of use of DME or AE;Obesity;Impaired UE functional use;Pain;Impaired sensation      OT Treatment/Interventions: Self-care/ADL training;Therapeutic exercise;DME and/or AE instruction;Therapeutic activities;Patient/family education;Balance training    OT Goals(Current goals can be found in the care plan section) Acute Rehab OT Goals Patient Stated Goal: to be able to take care of myself OT Goal Formulation: With patient Time For Goal Achievement: 01/19/17 Potential to Achieve Goals: Good ADL Goals Pt Will Perform Grooming: with modified independence;with adaptive equipment;sitting Pt Will Perform Upper Body Bathing: with modified independence;with adaptive equipment;sitting Pt Will Perform Upper Body Dressing: with modified independence;with adaptive equipment;sitting (compensatory strategies)  OT Frequency: Min 2X/week   Barriers to D/C:            Co-evaluation              End of Session Equipment Utilized During Treatment: Surveyor, mining Communication: Mobility status  Activity Tolerance: Patient tolerated treatment well Patient left: in chair;with call bell/phone within reach  OT Visit Diagnosis: Unsteadiness on feet (R26.81);Muscle weakness (generalized) (M62.81);Repeated falls (R29.6)  ADL either performed or assessed with clinical judgement  Time: 1030-1101 OT Time Calculation (min): 31 min Charges:  OT General Charges $OT Visit: 1 Procedure OT Evaluation $OT Eval Moderate Complexity: 1 Procedure OT Treatments $Self Care/Home Management : 8-22 mins G-Codes: OT G-codes **NOT FOR INPATIENT CLASS** Functional Assessment Tool Used: Clinical judgement Functional Limitation: Self care Self Care Current Status (X5369): At least 40 percent but less than 60 percent impaired, limited or restricted Self Care Goal Status (Q2300): At least 1 percent but less  than 20 percent impaired, limited or restricted   [facility], OT/L  [phone] 01/05/2017  Kesley Gaffey,HILLARY 01/05/2017, 11:25 AM

## 2017-01-05 NOTE — Progress Notes (Addendum)
Patient ID: [name] [name], male   DOB: 1932/11/18, 83 y.o.   MRN: [id number]  PROGRESS NOTE    EUGENIO DOLLINS  XFG:[id number] DOB: [birth year] DOA: 01/04/2017  PCP: [name], MD   Brief Narrative:  83 y.o. male with medical history significant for diabetes, neuropathy, A. fib, ulcerative colitis status post colectomy/colostomy, chronic kidney disease stage 3 with baseline creatinine of 2 back in 96/7893, chronic diastolic and systolic heart failure (ECHO in 01/2016 showed EF 40-45% with grade 1 DD), CAD, GI bleed, hypertension, anemia who presented to [facility] with left leg weakness and numbness started around 1:30 am the morning of the admission. It has progressed over the next couple of hours and around 6:30 am he could not bear weight on this leg. At baseline he walks with the walker. No other complaints such as chest pain or fever or slurred speech. She was seen by stroke team. MRI brain showed no acute stroke.  Assessment & Plan:   Principal Problem:   Left-sided weakness - Pt has chronic bilateral LE weakness which is likely deconditioning or diabetic polyneuropathy rather than due to stroke - CT head with no acute intracranial findings - MRI brain showed no acute stroke - Canceled SLP eval, pt has no difficulty swallowing, no slurred speech - Obtain PT eval  Active Problems:   Cerebrovascular disease - No acute issues - Pt has chronic small vessel change on MRI scan - Continue with risk factor modification - Continue aspirin and statin    Atrial fibrillation, paroxysmal - CHADS vasc score 5 - Continue amiodarone - On AC with aspirin; not on other AC due to history of GI bleed    Chronic diastolic and systolic heart failure  - ECHO in 01/2016 showed EF 40-45% with grade 1 DD - Compensated     Ulcerative colitis ([place]) - S/P colostomy    CAD status post drug-eluting stent in 2015 - Stable - Continue aspirin    Chronic kidney disease stage 3 - Baseline cr in 2017 was around  2 - Cr is within baseline range     Anemia of chronic kidney disease - Hgb stable     Diabetes mellitus with diabetic nephropathy without long term insulin use - Actos and januvia on hold and using SSI in hospital - HgA1c 6.5 on this admission indicating good glycemic control    Dyslipidemia associated with type 2 DM - Continue statin therapy     Essential hypertension - Continue metoprolol    DVT prophylaxis: SCD's bilaterally  Code Status: DNR/ DNI Family Communication: no family at the bedside this am Disposition Plan: home once cleared by neurology    Consultants:   [place] for colostomy   Neurology   Procedures:  None  Antimicrobials:   None     Subjective: No overnight events.  Objective: Vitals:   01/05/17 0500 01/05/17 1038 01/05/17 1400 01/05/17 1803  BP: (!) 121/43 (!) 123/51 (!) 122/46 (!) 110/41  Pulse: (!) 59 65 68 62  Resp: 16 18  18   Temp:  98.5 F (36.9 C)  98 F (36.7 C)  TempSrc:  Oral  Oral  SpO2: 98% 100% 100% 96%    Intake/Output Summary (Last 24 hours) at 01/05/17 1834 Last data filed at 01/05/17 1700  Gross per 24 hour  Intake          2125.83 ml  Output             1000 ml  Net  1125.83 ml   There were no vitals filed for this visit.  Examination:  General exam: Appears calm and comfortable  Respiratory system: Clear to auscultation. Respiratory effort normal. Cardiovascular system: S1 & S2 heard, Rate controlled  Gastrointestinal system: Abdomen is nondistended, soft and nontender. No organomegaly or masses felt. Normal bowel sounds heard. (+) Colostomy Central nervous system: Alert and oriented. No focal neurological deficits. Extremities: Symmetric 5 x 5 power. Nonpitting +1 edema in both lower extremities Skin: warm and dry skin Psychiatry: Judgement and insight appear normal. Mood & affect appropriate.   Data Reviewed: I have personally reviewed following labs and imaging studies  CBC:  Recent Labs Lab  01/04/17 1015 01/04/17 1023 01/05/17 0009  WBC 6.5  --  6.2  NEUTROABS 4.9  --   --   HGB 10.9* 11.2* 9.7*  HCT 34.3* 33.0* 30.5*  MCV 94.5  --  94.7  PLT 230  --  720   Basic Metabolic Panel:  Recent Labs Lab 01/04/17 1015 01/04/17 1023 01/05/17 0009  NA 138 137 134*  K 5.0 5.1 4.7  CL 106 105 105  CO2 20*  --  23  GLUCOSE 148* 146* 108*  BUN 31* 45* 28*  CREATININE 1.68* 1.60* 1.56*  CALCIUM 8.9  --  8.2*   GFR: CrCl cannot be calculated (Unknown ideal weight.). Liver Function Tests:  Recent Labs Lab 01/04/17 1015  AST 19  ALT 14*  ALKPHOS 87  BILITOT 0.6  PROT 6.3*  ALBUMIN 2.9*   No results for input(s): LIPASE, AMYLASE in the last 168 hours. No results for input(s): AMMONIA in the last 168 hours. Coagulation Profile:  Recent Labs Lab 01/04/17 1015  INR 1.07   Cardiac Enzymes: No results for input(s): CKTOTAL, CKMB, CKMBINDEX, TROPONINI in the last 168 hours. BNP (last 3 results) No results for input(s): PROBNP in the last 8760 hours. HbA1C:  Recent Labs  01/04/17 1118  HGBA1C 6.5*   CBG:  Recent Labs Lab 01/04/17 1625 01/04/17 2204 01/05/17 0619 01/05/17 1126 01/05/17 1639  GLUCAP 137* 96 115* 137* 153*   Lipid Profile:  Recent Labs  01/05/17 0009  CHOL 135  HDL 40*  LDLCALC 68  TRIG 137  CHOLHDL 3.4   Thyroid Function Tests: No results for input(s): TSH, T4TOTAL, FREET4, T3FREE, THYROIDAB in the last 72 hours. Anemia Panel: No results for input(s): VITAMINB12, FOLATE, FERRITIN, TIBC, IRON, RETICCTPCT in the last 72 hours. Urine analysis:    Component Value Date/Time   COLORURINE STRAW (A) 01/04/2017 1229   APPEARANCEUR CLEAR 01/04/2017 1229   LABSPEC 1.012 01/04/2017 1229   PHURINE 5.0 01/04/2017 1229   GLUCOSEU NEGATIVE 01/04/2017 1229   HGBUR NEGATIVE 01/04/2017 1229   BILIRUBINUR NEGATIVE 01/04/2017 1229   KETONESUR NEGATIVE 01/04/2017 1229   PROTEINUR NEGATIVE 01/04/2017 1229   UROBILINOGEN 0.2 10/22/2014  1052   NITRITE NEGATIVE 01/04/2017 1229   LEUKOCYTESUR NEGATIVE 01/04/2017 1229   Sepsis Labs: @LABRCNTIP (procalcitonin:4,lacticidven:4)   )No results found for this or any previous visit (from the past 240 hour(s)).    Radiology Studies: Dg Chest 2 View 01/04/2017 Mild scarring left upper lobe. No edema or consolidation. Stable cardiomegaly.   Ct Head Wo Contrast 01/04/2017 No acute intracranial abnormality.   Mr Brain Wo Contrast 01/04/2017 1.  No acute intracranial abnormality. 2. Stable noncontrast MRI appearance of the brain since 2017 with mild for age signal changes most compatible with chronic small vessel disease. 3. No intracranial artery stenosis and negative intracranial MRA aside from  generalized arterial tortuosity and normal anatomic variation of the right ICA terminus and the ACA anatomy.   Mr Jodene Nam Head/brain Wo Cm 01/04/2017  1.  No acute intracranial abnormality. 2. Stable noncontrast MRI appearance of the brain since 2017 with mild for age signal changes most compatible with chronic small vessel disease. 3. No intracranial artery stenosis and negative intracranial MRA aside from generalized arterial tortuosity and normal anatomic variation of the right ICA terminus and the ACA anatomy. Electronically Signed   By: Genevie Ann M.D.   On: 01/04/2017 14:18     Scheduled Meds: . amiodarone  200 mg Oral Daily  . enoxaparin (LOVE  40 mg Subcutaneous Q24H  . famotidine  10 mg Oral Daily  . ferrous sulfate  325 mg Oral BID WC  . insulin aspart  0-5 Units Subcutaneous QHS  . insulin aspart  0-9 Units Subcutaneous TID WC  . pantoprazole  40 mg Oral Daily  . simvastatin  20 mg Oral q1800  . tamsulosin  0.4 mg Oral QPC supper   Continuous Infusions: . sodium chloride 50 mL/hr at 01/04/17 2353     LOS: 0 days    Time spent: 25 minutes  Greater than 50% of the time spent on counseling and coordinating the care.   Leisa Lenz, MD [facility] Pager  [phone]  If 7PM-7AM, please contact night-coverage [email or web address] Password [facility] 01/05/2017, 6:34 PM

## 2017-01-05 NOTE — Progress Notes (Signed)
Stopped back by to see pt, and discussed advanced directive options with him. Pt believes he has living will  (DNR), but may wish to add healthcare agent. Will stop back by in morning to compare what is in file for him, when we could execute new form if he desires. He spent some time discussing his present illness, but more his life, especially with his taking care of his wife of 50+ yrs (while still working) during her last long illness when they were still able to take driving trips. Provided spiritual/emotional support and prayer -- which pt appreciated. Chaplain available for f/u.   01/05/17 1500  Clinical Encounter Type  Visited With Patient  Visit Type Follow-up;Psychological support;Spiritual support;Social support  Referral From Nurse  Spiritual Encounters  Spiritual Needs Prayer;Emotional  Stress Factors  Patient Stress Factors Health changes;Loss of control   Gerrit Heck, Chaplain

## 2017-01-05 NOTE — Consult Note (Signed)
[place] Nurse ostomy consult note Reason for Consult: Consult requested for colostomy supplies.  Pt states ostomy has been present many years and he is independent with pouch application and emptying when at home. He uses a 2 piece convex pouching system with paste; neither is available in the [place].  Free sample pouch applied and another left at the bedside. Current pouch has leaked behind the barrier when removed. Stoma red and viable, above skin level, 1 1/4 inches Applied barrier ring (instead of paste) and 2 piece convex pouching system, with pink tape applied around the barrier edges to assist with maintaining a seal. Peristomal skin intact with .2X.2X.1cm partial thickness wound at 3:00 o'clock, pink with small amt bleeding when cleansed. Mod amt brown semi-formed stool in the pouch. Pt denies further questions at this time and states he will attempt to have family members bring in his own extra pouching supplies later during his stay. Please re-consult if further assistance is needed.  Thank-you,  Julien Girt MSN, [place], [place], [place], [place]

## 2017-01-05 NOTE — Progress Notes (Signed)
Stopped by in response to consult to create advanced directive. Pt was busy w/ healthcare provider. Let nurse know will try again later.   01/05/17 1000  Clinical Encounter Type  Visited With Patient not available  Visit Type Initial;Psychological support;Spiritual support;Social support  Referral From Nurse   Gerrit Heck, Chaplain

## 2017-01-05 NOTE — Evaluation (Signed)
Physical Therapy Evaluation Patient Details Name: [name] [name] MRN: [id number] DOB: May 22, 1933 Today's Date: 01/05/2017   History of Present Illness  Pt is 83 y.o. M admitted with left sided weakness 2/2 fall. PMH significant for DM, obesity, HTN, CAD, CHF, CKD, a-fib, colostomy, and polyneuropathy.   Clinical Impression  Pt presented with decreased sensation in B LE, decreased strength B LE, decreased balance, and decreased exercise tolerance. During ambulation pt displayed decreased gait speed with decreased stance time on L with IR and locking into hyperextension for stability, L trendelenburg consistent with L lateral hip weakness. Ambulated the patient both with and without shoes; with shoes pt displayed increases in stability and decreased reports of foot pain. Pt prefered utilizing the RW with ambulation due to feeling weak even though pt does not report using a RWr at home.During romberg eyes closed pt displayed significant increases in posterior and lateral sway. Pt is at an increased risk for falls 2/2 balance, strength, and sensation deficits. Recommend continued Acute PT services to address above impairments. Recommend d/c to SNF due to increased fall risk 2/2  above impairments and decreased availability of care at d/c. Pt is agreement with plan but states that if he remains observation status cannot pay out of pocket. If d/c home recommend outpatient PT to address impairments.     Follow Up Recommendations SNF    Equipment Recommendations  Other (comment) Media planner)    Recommendations for Other Services       Precautions / Restrictions Precautions Precautions: Fall      Mobility  Bed Mobility Overal bed mobility: Needs Assistance Bed Mobility: Rolling;Sidelying to Sit Rolling: Supervision (required use of hand rails to roll ) Sidelying to sit: Supervision (pt used hand rails to help push trunk vertical )       General bed mobility comments:  Supervision  Transfers Overall transfer level: Needs assistance Equipment used: Rolling walker (2 wheeled) Transfers: Sit to/from Stand Sit to Stand: Min guard         General transfer comment: Pt required cues for hand placement and gaurding for safety during sit to stand   Ambulation/Gait Ambulation/Gait assistance: Min guard Ambulation Distance (Feet): 75 Feet (walked first time without shoes:72f. Walked second time with shoes:100 feet ) Assistive device: Rolling walker (2 wheeled) (Education given to keep walker on the ground and to remain inside while ambulating.) Gait Pattern/deviations: Decreased step length - right;Decreased step length - left;Decreased stance time - left;Decreased dorsiflexion - right;Decreased dorsiflexion - left;Decreased stride length;Trendelenburg;Trunk flexed;Wide base of support Gait velocity: overall decreased  Gait velocity interpretation: Below normal speed for age/gender General Gait Details: pt displayed decreased step length bilateral with overall decreased stride length. Trendelenburg gait pattern observed on L throughout gait cycle indicating L lateral hip weakness. Pt displays internal rotation of L LE with knee locked in hyperextension during stance phase for stabilty.   Stairs            Wheelchair Mobility    Modified Rankin (Stroke Patients Only) Modified Rankin (Stroke Patients Only) Pre-Morbid Rankin Score: No significant disability Modified Rankin: Moderately severe disability     Balance Overall balance assessment: History of Falls;Needs assistance Sitting-balance support: Feet supported;No upper extremity supported Sitting balance-[name]: Good   Postural control: Posterior lean Standing balance support: Bilateral upper extremity supported;During functional activity Standing balance-[name]: Fair           Rhomberg - Eyes Opened: 30 (pt unable to bring feet together but able to maintain  balance without  assistance) Rhomberg - Eyes Closed: 20 (pt unable to bring feet together but noticable increase in posterior and lateral sway. Pt required assistance to gain balance with eyes closed but was able to maintain position for 20 seconds once stable  )                 Pertinent Vitals/Pain Pain Assessment: 0-10 Pain Score: 8  Pain Location: Bilateral legs  Pain Descriptors / Indicators: Sore;Heaviness Pain Intervention(s): Monitored during session;Limited activity within patient's tolerance    Home Living                        Prior Function                 Hand Dominance        Extremity/Trunk Assessment   Upper Extremity Assessment Upper Extremity Assessment: Defer to OT evaluation    Lower Extremity Assessment Lower Extremity Assessment: RLE deficits/detail;LLE deficits/detail RLE Deficits / Details: Hip Flexion 3+/5, Knee extension 4-/5, Knee Flexion 4-/5, DF 4-/5  RLE Sensation: decreased light touch;history of peripheral neuropathy RLE Coordination:  (coordination intact) LLE Deficits / Details: Hip Flexion 3+/5, Knee extension 4-/5, Knee Flexion 4-/5, DF 4-/5  LLE Sensation: decreased light touch;history of peripheral neuropathy LLE Coordination:  (coordination intact )    Cervical / Trunk Assessment Cervical / Trunk Assessment: Kyphotic  Communication      Cognition Arousal/Alertness: Awake/alert Behavior During Therapy: WFL for tasks assessed/performed Overall Cognitive Status: Within Functional Limits for tasks assessed                      General Comments General comments (skin integrity, edema, etc.): educated the patient on the availability of diabetic shoes to help decrease pt burden while dressing and to increase appropriate stability during ambulation    Exercises     Assessment/Plan    PT Assessment Patient needs continued PT services  PT Problem List Decreased strength;Decreased range of motion;Decreased activity  tolerance;Decreased balance;Decreased mobility;Decreased knowledge of use of DME;Impaired sensation;Obesity;Pain       PT Treatment Interventions DME instruction;Gait training;Functional mobility training;Therapeutic activities;Balance training;Therapeutic exercise    PT Goals (Current goals can be found in the Care Plan section)  Acute Rehab PT Goals Patient Stated Goal: to be able to take care of myself PT Goal Formulation: With patient Time For Goal Achievement: 01/12/17 Potential to Achieve Goals: Fair    Frequency Min 3X/week   Barriers to discharge Decreased caregiver support Pt does not have the necessary help at home for d/c    Co-evaluation               End of Session Equipment Utilized During Treatment: Gait belt Activity Tolerance: Patient tolerated treatment well Patient left: in chair;with call bell/phone within reach   PT Visit Diagnosis: Other abnormalities of gait and mobility (R26.89);History of falling (Z91.81);Unsteadiness on feet (R26.81)    Functional Assessment Tool Used: AM-PAC 6 Clicks Basic Mobility Functional Limitation: Mobility: Walking and moving around Mobility: Walking and Moving Around Current Status (Z6109): At least 40 percent but less than 60 percent impaired, limited or restricted Mobility: Walking and Moving Around Goal Status [phone]): At least 20 percent but less than 40 percent impaired, limited or restricted    Time: 0918-1005 PT Time Calculation (min) (ACUTE ONLY): 47 min   Charges:   PT Evaluation $PT Eval Moderate Complexity: 1 Procedure PT Treatments $Gait Training: 23-37 mins  PT G Codes:   PT G-Codes **NOT FOR INPATIENT CLASS** Functional Assessment Tool Used: AM-PAC 6 Clicks Basic Mobility Functional Limitation: Mobility: Walking and moving around Mobility: Walking and Moving Around Current Status (R4270): At least 40 percent but less than 60 percent impaired, limited or restricted Mobility: Walking and Moving  Around Goal Status [phone]): At least 20 percent but less than 40 percent impaired, limited or restricted     Glee Arvin, SPT 01/05/2017

## 2017-01-05 NOTE — Progress Notes (Signed)
Stopped by to discuss advanced directive, but pt had closed door to eat lunch. Will check back later.   01/05/17 1200  Clinical Encounter Type  Visited With Patient not available  Visit Type Follow-up  Referral From [place] [facility] Polimeni, Chaplain

## 2017-01-05 NOTE — Progress Notes (Signed)
SLP Cancellation Note  Patient Details Name: [name] [name] MRN: [id number] DOB: September 21, 1933   Cancelled treatment:       Reason Eval/Treat Not Completed: SLP screened, no needs identified, will sign off   [name] 01/05/2017, 11:25 AM

## 2017-01-06 DIAGNOSIS — E1121 Type 2 diabetes mellitus with diabetic nephropathy: Secondary | ICD-10-CM

## 2017-01-06 LAB — CBC
HCT: 32.6 % — ABNORMAL LOW (ref 39.0–52.0)
Hemoglobin: 10.4 g/dL — ABNORMAL LOW (ref 13.0–17.0)
MCH: 30.1 pg (ref 26.0–34.0)
MCHC: 31.9 g/dL (ref 30.0–36.0)
MCV: 94.2 fL (ref 78.0–100.0)
Platelets: 215 10*3/uL (ref 150–400)
RBC: 3.46 MIL/uL — ABNORMAL LOW (ref 4.22–5.81)
RDW: 14.9 % (ref 11.5–15.5)
WBC: 6 10*3/uL (ref 4.0–10.5)

## 2017-01-06 LAB — BASIC METABOLIC PANEL
Anion gap: 9 (ref 5–15)
BUN: 29 mg/dL — ABNORMAL HIGH (ref 6–20)
CO2: 23 mmol/L (ref 22–32)
Calcium: 8.4 mg/dL — ABNORMAL LOW (ref 8.9–10.3)
Chloride: 101 mmol/L (ref 101–111)
Creatinine, Ser: 1.69 mg/dL — ABNORMAL HIGH (ref 0.61–1.24)
GFR calc Af Amer: 42 mL/min — ABNORMAL LOW (ref >60)
GFR calc non Af Amer: 36 mL/min — ABNORMAL LOW (ref >60)
Glucose, Bld: 134 mg/dL — ABNORMAL HIGH (ref 65–99)
Potassium: 4.7 mmol/L (ref 3.5–5.1)
Sodium: 133 mmol/L — ABNORMAL LOW (ref 135–145)

## 2017-01-06 LAB — GLUCOSE, CAPILLARY
Glucose-Capillary: 121 mg/dL — ABNORMAL HIGH (ref 65–99)
Glucose-Capillary: 128 mg/dL — ABNORMAL HIGH (ref 65–99)
Glucose-Capillary: 124 mg/dL — ABNORMAL HIGH (ref 65–99)
Glucose-Capillary: 111 mg/dL — ABNORMAL HIGH (ref 65–99)

## 2017-01-06 MED ORDER — GABAPENTIN 100 MG PO CAPS
100.0000 mg | ORAL_CAPSULE | Freq: Two times a day (BID) | ORAL | Status: DC
  Administered 2017-01-06 – 2017-01-08 (×4): 100 mg via ORAL
  Filled 2017-01-06 (×4): qty 1

## 2017-01-06 NOTE — NC FL2 (Signed)
[place] LEVEL OF CARE SCREENING TOOL     IDENTIFICATION  Patient Name: [name] [name] Birthdate: April 29, 1933 Sex: male Admission Date (Current Location): 01/04/2017  [facility] and [place] Number:  Herbalist and Address:  [facility], [place] [address] [place], [place] [address]      Provider Number: [id number]  Attending Physician Name and Address:  Modena Jansky, MD  Relative Name and Phone Number:       Current Level of Care: Hospital Recommended Level of Care: [place] Prior Approval Number:    Date Approved/Denied:   PASRR Number: [id number]  Discharge Plan: SNF    Current Diagnoses: Patient Active Problem List   Diagnosis Date Noted  . Diabetes mellitus with diabetic nephropathy without long-term current use of insulin ([place]) 01/05/2017  . Left-sided weakness 01/04/2017  . CKD (chronic kidney disease), stage III   . Acute on chronic combined systolic and diastolic CHF (congestive heart failure) ([place])   . Paroxysmal atrial fibrillation ([name])   . Bleeding gastrointestinal   . Dyslipidemia 10/14/2014  . CAD in native artery- total LAD, s/p RI DES 06/19/14 07/11/2014  . Benign essential HTN 10/02/2013  . Ulcerative colitis ([place])     Orientation RESPIRATION BLADDER Height & Weight     Self, Time, Situation, Place  Normal Continent Weight:   Height:     BEHAVIORAL SYMPTOMS/MOOD NEUROLOGICAL BOWEL NUTRITION STATUS      Continent Diet (Please see DC Summary)  AMBULATORY STATUS COMMUNICATION OF NEEDS Skin   Limited Assist Verbally Normal                       Personal Care Assistance Level of Assistance  Bathing, Feeding, Dressing Bathing Assistance: Limited assistance Feeding assistance: Independent Dressing Assistance: Independent     Functional Limitations Info             SPECIAL CARE FACTORS FREQUENCY  PT (By licensed PT)     PT Frequency: 3x/week               Contractures      Additional Factors Info  Code Status, Allergies, Insulin Sliding Scale Code Status Info: DNR Allergies Info: Brilinta Ticagrelor, Penicillins, Sulfa Antibiotics   Insulin Sliding Scale Info: 3x daily with meals and at bedtime       Current Medications (01/06/2017):  This is the current hospital active medication list Current Facility-Administered Medications  Medication Dose Route Frequency Provider Last Rate Last Dose  .  stroke: mapping our early stages of recovery book   Does not apply Once Radene Gunning, NP      . 0.9 %  sodium chloride infusion   Intravenous Continuous Radene Gunning, NP 50 mL/hr at 01/04/17 2353    . acetaminophen (TYLENOL) tablet 650 mg  650 mg Oral Q4H PRN Radene Gunning, NP       Or  . acetaminophen (TYLENOL) solution 650 mg  650 mg Per Tube Q4H PRN Radene Gunning, NP       Or  . acetaminophen (TYLENOL) suppository 650 mg  650 mg Rectal Q4H PRN Radene Gunning, NP      . amiodarone (PACERONE) tablet 200 mg  200 mg Oral Daily Lezlie Octave Black, NP   200 mg at 01/06/17 1104  . famotidine (PEPCID) tablet 10 mg  10 mg Oral Daily Radene Gunning, NP   10 mg at 01/06/17 1105  .  ferrous sulfate tablet 325 mg  325 mg Oral BID WC Radene Gunning, NP   325 mg at 01/06/17 0813  . HYDROcodone-acetaminophen (NORCO/VICODIN) 5-325 MG per tablet 1 tablet  1 tablet Oral Q4H PRN Radene Gunning, NP   1 tablet at 01/06/17 [phone]  . insulin aspart (novoLOG) injection 0-5 Units  0-5 Units Subcutaneous QHS Lezlie Octave Black, NP      . insulin aspart (novoLOG) injection 0-9 Units  0-9 Units Subcutaneous TID WC Radene Gunning, NP   1 Units at 01/06/17 1240  . metoprolol tartrate (LOPRESSOR) tablet 12.5 mg  12.5 mg Oral BID Robbie Lis, MD   12.5 mg at 01/06/17 1103  . pantoprazole (PROTONIX) EC tablet 40 mg  40 mg Oral Daily Radene Gunning, NP   40 mg at 01/06/17 1104  . senna-docusate (Senokot-S) tablet 1 tablet  1 tablet Oral QHS PRN Radene Gunning, NP      . simvastatin (ZOCOR)  tablet 20 mg  20 mg Oral q1800 Radene Gunning, NP   20 mg at 01/05/17 1700  . tamsulosin (FLOMAX) capsule 0.4 mg  0.4 mg Oral QPC supper Radene Gunning, NP   0.4 mg at 01/05/17 1700     Discharge Medications: Please see discharge summary for a list of discharge medications.  Relevant Imaging Results:  Relevant Lab Results:   Additional Information SSn: [id number] [place], [place]

## 2017-01-06 NOTE — Progress Notes (Addendum)
qPhysical Therapy Treatment Patient Details Name: [name] [name] MRN: [id number] DOB: Jun 28, 1933 Today's Date: 01/06/2017    History of Present Illness Pt is 83 y.o. M admitted with left sided weakness 2/2 fall. PMH significant for DM, obesity, HTN, CAD, CHF, CKD, a-fib, colostomy, and polyneuropathy.     PT Comments    Pt performed gait with c/o pain in B LEs and feet.  Pt remains unsteady and requires cues to maintain safety.  Plan for patient remains appropriate at this time.  Will plan next session to progress to standing exercises to challenge balance and improve strength.      Follow Up Recommendations  SNF     Equipment Recommendations  Other (comment) (shower chair. )    Recommendations for Other Services       Precautions / Restrictions Precautions Precautions: Fall    Mobility  Bed Mobility Overal bed mobility: Needs Assistance Bed Mobility: Supine to Sit   Sidelying to sit: Supervision       General bed mobility comments: Required use of hand rail and cues to push with B UEs to elevate trunk into sitting.    Transfers Overall transfer level: Needs assistance Equipment used: Rolling walker (2 wheeled) Transfers: Sit to/from Stand Sit to Stand: Supervision         General transfer comment: Cues for hand placment to push from seated surface.    Ambulation/Gait Ambulation/Gait assistance: Min guard Ambulation Distance (Feet): 100 Feet Assistive device: Rolling walker (2 wheeled) Gait Pattern/deviations: Step-through pattern;Wide base of support;Decreased stride length;Trunk flexed Gait velocity: overall decreased  Gait velocity interpretation: Below normal speed for age/gender General Gait Details: Cues for upper trunk control.  Cues to increase B stride length.  Pt present with ER of b hips during gait training.  Cues for RW safety to maintain position within RW.     Stairs            Wheelchair Mobility    Modified Rankin (Stroke Patients  Only) Modified Rankin (Stroke Patients Only) Pre-Morbid Rankin Score: No significant disability Modified Rankin: Moderately severe disability     Balance     Sitting balance-[name]: Good       Standing balance-[name]: Fair                              Cognition Arousal/Alertness: Awake/alert Behavior During Therapy: WFL for tasks assessed/performed Overall Cognitive Status: Within Functional Limits for tasks assessed                                        Exercises General Exercises - Lower Extremity Quad Sets: AROM;Both;10 reps;Supine Long Arc Quad: AROM;Both;10 reps;Seated Hip ABduction/ADduction: Both;10 reps;Supine;AROM Hip Flexion/Marching: AROM;Both;10 reps;Seated    General Comments        Pertinent Vitals/Pain Pain Assessment: 0-10 Pain Score: 10-Worst pain ever Pain Location: Bilateral legs, feet (reports he would feel better in his shoes) Pain Descriptors / Indicators: Sore;Heaviness Pain Intervention(s): Monitored during session;Repositioned    Home Living                      Prior Function            PT Goals (current goals can now be found in the care plan section) Acute Rehab PT Goals Patient Stated Goal: to be able to take  care of myself Potential to Achieve Goals: Fair Progress towards PT goals: Progressing toward goals    Frequency    Min 3X/week      PT Plan Current plan remains appropriate    Co-evaluation             End of Session Equipment Utilized During Treatment: Gait belt Activity Tolerance: Patient tolerated treatment well Patient left: in chair;with call bell/phone within reach (OT to dovetail treatment therefore alarm not set.  ) Nurse Communication: Mobility status PT Visit Diagnosis: Other abnormalities of gait and mobility (R26.89);History of falling (Z91.81);Unsteadiness on feet (R26.81)     Time: 5583-1674 PT Time Calculation (min) (ACUTE ONLY): 17  min  Charges:  $Gait Training: 8-22 mins                    G Codes:       Governor Rooks, PTA pager [phone]    Cristela Blue 01/06/2017, 6:07 PM

## 2017-01-06 NOTE — Progress Notes (Signed)
Stopped by to visit and close the loop on whether pt wished to make advanced directive changes while here after he'd had chance to discuss same today w/ family members. No, they appreciatereceiving information, and have taken the form home to fill out later when pt is somewhere else. Provided emotional/spiritual support and prayer. Chaplain available for f/u.   01/06/17 1400  Clinical Encounter Type  Visited With Patient  Visit Type Follow-up;Psychological support;Spiritual support;Social support  Referral From Chaplain  Spiritual Encounters  Spiritual Needs Prayer;Emotional  Stress Factors  Patient Stress Factors Health changes;Loss of control   Gerrit Heck, Chaplain

## 2017-01-06 NOTE — Progress Notes (Signed)
Patient ID: [name] [name], male   DOB: [birth year], 83 y.o.   MRN: [id number]  PROGRESS NOTE    BRYLEY KOVACEVIC  LPF:[id number] DOB: 1933/02/19 DOA: 01/04/2017  PCP: [name], MD   Brief Narrative:  83 y.o. male with medical history significant for diabetes, neuropathy, A. fib, ulcerative colitis status post colectomy/colostomy, chronic kidney disease stage 3 with baseline creatinine of 2 back in 53/2992, chronic diastolic and systolic heart failure (ECHO in 01/2016 showed EF 40-45% with grade 1 DD), CAD, GI bleed, hypertension, anemia who presented to [facility] with left leg weakness and numbness started around 1:30 am the morning of the admission. It has progressed over the next couple of hours and around 6:30 am he could not bear weight on this leg. At baseline he walks with the walker. No other complaints such as chest pain or fever or slurred speech. She was seen by stroke team. MRI brain showed no acute stroke.  Assessment & Plan:   Principal Problem:   Left-sided weakness - Pt has chronic bilateral LE weakness which is likely deconditioning or diabetic polyneuropathy rather than due to stroke - CT head with no acute intracranial findings - MRI brain showed no acute stroke - Canceled SLP eval, pt has no difficulty swallowing, no slurred speech - Obtain PT eval who recommended SNF. Clinical social work following for placement needs. - Neurology consultation and follow-up appreciated and indicate that his bilateral lower extremity weakness is chronic consistent with deconditioning with possible contribution from severe underlying polyneuropathy but no findings to suggest acute stroke.  Active Problems:   Cerebrovascular disease - No acute issues - Pt has chronic small vessel change on MRI scan - Continue with risk factor modification - Continue aspirin and statin    Atrial fibrillation, paroxysmal - CHADS vasc score 5 - Continue amiodarone - On AC with aspirin; not on other AC due to  history of GI bleed    Chronic diastolic and systolic heart failure  - ECHO in 01/2016 showed EF 40-45% with grade 1 DD - Compensated     Ulcerative colitis ([place]) - S/P colostomy    CAD status post drug-eluting stent in 2015 - Stable - Continue aspirin    Chronic kidney disease stage 3 - Baseline cr in 2017 was around 2 - Cr is within baseline range     Anemia of chronic kidney disease - Hgb stable     Diabetes mellitus with diabetic nephropathy without long term insulin use - Actos and januvia on hold and using SSI in hospital - HgA1c 6.5 on this admission indicating good glycemic control - As per neurology follow-up, his polyneuropathy is chronic and severe. They indicated that he may benefit from a trial of gabapentin given his burning pain in both feet. Will start.    Dyslipidemia associated with type 2 DM - Continue statin therapy     Essential hypertension - Continue metoprolol    DVT prophylaxis: SCD's bilaterally  Code Status: DNR/ DNI Family Communication: no family at the bedside this am Disposition Plan: SNF when bed available.   Consultants:   WOC for colostomy   Neurology -Signed off.  Procedures:  None  Antimicrobials:   None     Subjective: Patient seen this morning. Sitting up eating breakfast. Complained of pain in both his feet which is constant. Mostly painful in the forefeet. Denies any other strokelike symptoms. As per RN, no acute issues.   Objective: Vitals:   01/06/17 0131 01/06/17 0537 01/06/17 1049 01/06/17  1442  BP: (!) 128/49 (!) 119/49 (!) 134/44 (!) 126/50  Pulse: 62 (!) 59 65 68  Resp: 20 20 20 20   Temp: 97.7 F (36.5 C) 97.6 F (36.4 C) 98.4 F (36.9 C) 98.3 F (36.8 C)  TempSrc: Oral Oral Oral Oral  SpO2: 98% 99% 99% 99%    Intake/Output Summary (Last 24 hours) at 01/06/17 1621 Last data filed at 01/06/17 1600  Gross per 24 hour  Intake              360 ml  Output              400 ml  Net              -40 ml     There were no vitals filed for this visit.  Examination:  General exam: Appears calm and comfortable . Sitting up comfortably in bed eating breakfast this morning. Respiratory system: Clear to auscultation. Respiratory effort normal. Cardiovascular system: S1 & S2 heard, Rate controlled. No JVD, murmurs or pedal edema. Telemetry: Sinus rhythm? BBB morphology.  Gastrointestinal system: Abdomen is nondistended, soft and nontender. No organomegaly or masses felt. Normal bowel sounds heard. (+) Colostomy. Laparotomy scar.  Central nervous system: Alert and oriented. No focal neurological deficits. Extremities: Symmetric 5 x 5 power.  Skin: warm and dry skin Psychiatry: Judgement and insight appear normal. Mood & affect appropriate.   Data Reviewed: I have personally reviewed following labs and imaging studies  CBC:  Recent Labs Lab 01/04/17 1015 01/04/17 1023 01/05/17 0009 01/06/17 0420  WBC 6.5  --  6.2 6.0  NEUTROABS 4.9  --   --   --   HGB 10.9* 11.2* 9.7* 10.4*  HCT 34.3* 33.0* 30.5* 32.6*  MCV 94.5  --  94.7 94.2  PLT 230  --  202 833   Basic Metabolic Panel:  Recent Labs Lab 01/04/17 1015 01/04/17 1023 01/05/17 0009 01/06/17 0420  NA 138 137 134* 133*  K 5.0 5.1 4.7 4.7  CL 106 105 105 101  CO2 20*  --  23 23  GLUCOSE 148* 146* 108* 134*  BUN 31* 45* 28* 29*  CREATININE 1.68* 1.60* 1.56* 1.69*  CALCIUM 8.9  --  8.2* 8.4*   GFR: CrCl cannot be calculated (Unknown ideal weight.). Liver Function Tests:  Recent Labs Lab 01/04/17 1015  AST 19  ALT 14*  ALKPHOS 87  BILITOT 0.6  PROT 6.3*  ALBUMIN 2.9*   No results for input(s): LIPASE, AMYLASE in the last 168 hours. No results for input(s): AMMONIA in the last 168 hours. Coagulation Profile:  Recent Labs Lab 01/04/17 1015  INR 1.07   Cardiac Enzymes: No results for input(s): CKTOTAL, CKMB, CKMBINDEX, TROPONINI in the last 168 hours. BNP (last 3 results) No results for input(s): PROBNP in the  last 8760 hours. HbA1C:  Recent Labs  01/04/17 1118  HGBA1C 6.5*   CBG:  Recent Labs Lab 01/05/17 1126 01/05/17 1639 01/05/17 2141 01/06/17 0606 01/06/17 1204  GLUCAP 137* 153* 131* 121* 128*   Lipid Profile:  Recent Labs  01/05/17 0009  CHOL 135  HDL 40*  LDLCALC 68  TRIG 137  CHOLHDL 3.4   Thyroid Function Tests: No results for input(s): TSH, T4TOTAL, FREET4, T3FREE, THYROIDAB in the last 72 hours. Anemia Panel: No results for input(s): VITAMINB12, FOLATE, FERRITIN, TIBC, IRON, RETICCTPCT in the last 72 hours. Urine analysis:    Component Value Date/Time   COLORURINE STRAW (A) 01/04/2017 1229   APPEARANCEUR  CLEAR 01/04/2017 1229   LABSPEC 1.012 01/04/2017 1229   PHURINE 5.0 01/04/2017 1229   GLUCOSEU NEGATIVE 01/04/2017 1229   HGBUR NEGATIVE 01/04/2017 1229   BILIRUBINUR NEGATIVE 01/04/2017 1229   KETONESUR NEGATIVE 01/04/2017 1229   PROTEINUR NEGATIVE 01/04/2017 1229   UROBILINOGEN 0.2 10/22/2014 1052   NITRITE NEGATIVE 01/04/2017 1229   LEUKOCYTESUR NEGATIVE 01/04/2017 1229     Radiology Studies: Dg Chest 2 View 01/04/2017 Mild scarring left upper lobe. No edema or consolidation. Stable cardiomegaly.   Ct Head Wo Contrast 01/04/2017 No acute intracranial abnormality.   Mr Brain Wo Contrast 01/04/2017 1.  No acute intracranial abnormality. 2. Stable noncontrast MRI appearance of the brain since 2017 with mild for age signal changes most compatible with chronic small vessel disease. 3. No intracranial artery stenosis and negative intracranial MRA aside from generalized arterial tortuosity and normal anatomic variation of the right ICA terminus and the ACA anatomy.   Mr Jodene Nam Head/brain Wo Cm 01/04/2017  1.  No acute intracranial abnormality. 2. Stable noncontrast MRI appearance of the brain since 2017 with mild for age signal changes most compatible with chronic small vessel disease. 3. No intracranial artery stenosis and negative intracranial MRA aside from  generalized arterial tortuosity and normal anatomic variation of the right ICA terminus and the ACA anatomy. Electronically Signed   By: Genevie Ann M.D.   On: 01/04/2017 14:18     Scheduled Meds: . amiodarone  200 mg Oral Daily  . enoxaparin (LOVE  40 mg Subcutaneous Q24H  . famotidine  10 mg Oral Daily  . ferrous sulfate  325 mg Oral BID WC  . insulin aspart  0-5 Units Subcutaneous QHS  . insulin aspart  0-9 Units Subcutaneous TID WC  . pantoprazole  40 mg Oral Daily  . simvastatin  20 mg Oral q1800  . tamsulosin  0.4 mg Oral QPC supper   Continuous Infusions: . sodium chloride 50 mL/hr at 01/04/17 2353     LOS: 1 day    Time spent: 25 minutes  Greater than 50% of the time spent on counseling and coordinating the care.   Vernell Leep, MD, FACP, FHM. [facility] Pager [phone]  If 7PM-7AM, please contact night-coverage [email or web address] Password [facility] 01/06/2017, 4:30 PM

## 2017-01-06 NOTE — Care Management Note (Signed)
Case Management Note  Patient Details  Name: FEDERICK LEVENE MRN: [id number] Date of Birth: 08/22/1933  Subjective/Objective:    Patient in with lt sided weakness. He is from home alone with multiple falls at home.                 Action/Plan: PT/OT recommending SNF. CSW aware. CM following.  Expected Discharge Date:                  Expected Discharge Plan:  Skilled Nursing Facility  In-House Referral:  Clinical Social Work  Discharge planning Services     Post Acute Care Choice:    Choice offered to:     DME Arranged:    DME Agency:     HH Arranged:    [place] Agency:     Status of Service:  In process, will continue to follow  If discussed at Long Length of Stay Meetings, dates discussed:    Additional Comments:  Pollie Friar, RN 01/06/2017, 12:58 PM

## 2017-01-07 LAB — GLUCOSE, CAPILLARY
Glucose-Capillary: 119 mg/dL — ABNORMAL HIGH (ref 65–99)
Glucose-Capillary: 112 mg/dL — ABNORMAL HIGH (ref 65–99)
Glucose-Capillary: 132 mg/dL — ABNORMAL HIGH (ref 65–99)
Glucose-Capillary: 120 mg/dL — ABNORMAL HIGH (ref 65–99)

## 2017-01-07 MED ORDER — HYDROCODONE-ACETAMINOPHEN 5-325 MG PO TABS: 1.0000 | ORAL_TABLET | Freq: Four times a day (QID) | ORAL | 0 refills | Status: DC | PRN

## 2017-01-07 MED ORDER — SENNOSIDES-DOCUSATE SODIUM 8.6-50 MG PO TABS: 1.0000 | ORAL_TABLET | Freq: Every evening | ORAL | Status: DC | PRN

## 2017-01-07 MED ORDER — SODIUM CHLORIDE 0.9 % IV BOLUS (SEPSIS)
250.0000 mL | Freq: Once | INTRAVENOUS | Status: AC
  Administered 2017-01-07: 250 mL via INTRAVENOUS

## 2017-01-07 MED ORDER — GABAPENTIN 100 MG PO CAPS: 100.0000 mg | ORAL_CAPSULE | Freq: Two times a day (BID) | ORAL | Status: DC

## 2017-01-07 NOTE — Progress Notes (Signed)
Pt resting quietly in bed with no s/s of pain or discomfort. Awaiting discharge on 3/25 to [facility] facility.

## 2017-01-07 NOTE — Progress Notes (Signed)
Patient able to dc to SNF on 01/08/17. MD completed Med Rec as requested and sent to [facility].  Percell Locus Talana Slatten LCSWA [phone]

## 2017-01-07 NOTE — Progress Notes (Signed)
Patient ID: [name] [name], male   DOB: 08/03/1933, 83 y.o.   MRN: [id number]  PROGRESS NOTE    [name] [name]  DJM:[id number] DOB: 06/02/1933 DOA: 01/04/2017  PCP: [name], MD   Brief Narrative:  83 y.o. male with medical history significant for diabetes, neuropathy, A. fib, ulcerative colitis status post colectomy/colostomy, chronic kidney disease stage 3 with baseline creatinine of 2 back in 22/2979, chronic diastolic and systolic heart failure (ECHO in 01/2016 showed EF 40-45% with grade 1 DD), CAD, GI bleed, hypertension, anemia who presented to [facility] with left leg weakness and numbness started around 1:30 am the morning of the admission. It has progressed over the next couple of hours and around 6:30 am he could not bear weight on this leg. At baseline he walks with the walker. No other complaints such as chest pain or fever or slurred speech. She was seen by stroke team. MRI brain showed no acute stroke.  Assessment & Plan:   Principal Problem:   Left-sided weakness - Pt has chronic bilateral LE weakness which is likely deconditioning or diabetic polyneuropathy rather than due to stroke - CT head with no acute intracranial findings - MRI brain showed no acute stroke - Canceled SLP eval, pt has no difficulty swallowing, no slurred speech - Obtain PT eval who recommended SNF. Clinical social work following for placement needs. - Neurology consultation and follow-up appreciated and indicate that his bilateral lower extremity weakness is chronic consistent with deconditioning with possible contribution from severe underlying polyneuropathy but no findings to suggest acute stroke.  Active Problems:   Cerebrovascular disease - No acute issues - Pt has chronic small vessel change on MRI scan - Continue with risk factor modification - Continue aspirin and statin    Atrial fibrillation, paroxysmal - CHADS vasc score 5 - Continue amiodarone - On AC with aspirin; not on other AC due to  history of GI bleed    Chronic diastolic and systolic heart failure  - ECHO in 01/2016 showed EF 40-45% with grade 1 DD - Compensated     Ulcerative colitis ([place]) - S/P colostomy    CAD status post drug-eluting stent in 2015 - Stable - Continue aspirin    Chronic kidney disease stage 3 - Baseline cr in 2017 was around 2 - Cr is within baseline range     Anemia of chronic kidney disease - Hgb stable     Diabetes mellitus with diabetic nephropathy without long term insulin use - Actos and januvia on hold and using SSI in hospital - HgA1c 6.5 on this admission indicating good glycemic control - As per neurology follow-up, his polyneuropathy is chronic and severe. They indicated that he may benefit from a trial of gabapentin given his burning pain in both feet.  - Started low-dose dose gabapentin with improvement in neuropathic pain. This can be titrated as outpatient at SNF as deemed necessary. Periodic follow-up of CBCs.    Dyslipidemia associated with type 2 DM - Continue statin therapy     Essential hypertension - Continue metoprolol    DVT prophylaxis: SCD's bilaterally  Code Status: DNR/ DNI Family Communication: no family at the bedside this am Disposition Plan: SNF when bed available-3/25.   Consultants:   WOC for colostomy   Neurology -Signed off.  Procedures:  None  Antimicrobials:   None     Subjective: States that neuropathic leg pain is better. Indicated that when he ambulated with the help of supervision last night to empty his colostomy  bag, he felt weak and felt like his legs were going to give away and had to return to the bed quickly. No fall. Does report history of falls at home.  Objective: Vitals:   01/07/17 0117 01/07/17 0500 01/07/17 0900 01/07/17 1357  BP: (!) 109/37 103/68 (!) 112/42 (!) 111/54  Pulse: (!) 54 62 (!) 57 (!) 50  Resp: 20 20 20 20   Temp: 97.9 F (36.6 C) 97.6 F (36.4 C) 97.6 F (36.4 C) 97.4 F (36.3 C)  TempSrc:  Oral Oral Oral Oral  SpO2: 98% 98% 100% 98%  Weight:  109.2 kg (240 lb 11.2 oz)    Height:        Intake/Output Summary (Last 24 hours) at 01/07/17 1456 Last data filed at 01/07/17 0817  Gross per 24 hour  Intake              240 ml  Output              300 ml  Net              -60 ml   Filed Weights   01/07/17 0500  Weight: 109.2 kg (240 lb 11.2 oz)    Examination:  General exam: Appears calm and comfortable. Respiratory system: Clear to auscultation. Respiratory effort normal. Cardiovascular system: S1 & S2 heard, Rate controlled. No JVD, murmurs or pedal edema.  Gastrointestinal system: Abdomen is nondistended, soft and nontender. No organomegaly or masses felt. Normal bowel sounds heard. (+) Colostomy. Laparotomy scar.  Central nervous system: Alert and oriented. No focal neurological deficits. Extremities: Symmetric 5 x 5 power.  Skin: warm and dry skin Psychiatry: Judgement and insight appear normal. Mood & affect appropriate.   Data Reviewed: I have personally reviewed following labs and imaging studies  CBC:  Recent Labs Lab 01/04/17 1015 01/04/17 1023 01/05/17 0009 01/06/17 0420  WBC 6.5  --  6.2 6.0  NEUTROABS 4.9  --   --   --   HGB 10.9* 11.2* 9.7* 10.4*  HCT 34.3* 33.0* 30.5* 32.6*  MCV 94.5  --  94.7 94.2  PLT 230  --  202 030   Basic Metabolic Panel:  Recent Labs Lab 01/04/17 1015 01/04/17 1023 01/05/17 0009 01/06/17 0420  NA 138 137 134* 133*  K 5.0 5.1 4.7 4.7  CL 106 105 105 101  CO2 20*  --  23 23  GLUCOSE 148* 146* 108* 134*  BUN 31* 45* 28* 29*  CREATININE 1.68* 1.60* 1.56* 1.69*  CALCIUM 8.9  --  8.2* 8.4*   GFR: Estimated Creatinine Clearance: 41.1 mL/min (A) (by C-G formula based on SCr of 1.69 mg/dL (H)). Liver Function Tests:  Recent Labs Lab 01/04/17 1015  AST 19  ALT 14*  ALKPHOS 87  BILITOT 0.6  PROT 6.3*  ALBUMIN 2.9*   No results for input(s): LIPASE, AMYLASE in the last 168 hours. No results for input(s):  AMMONIA in the last 168 hours. Coagulation Profile:  Recent Labs Lab 01/04/17 1015  INR 1.07   Cardiac Enzymes: No results for input(s): CKTOTAL, CKMB, CKMBINDEX, TROPONINI in the last 168 hours. BNP (last 3 results) No results for input(s): PROBNP in the last 8760 hours. HbA1C: No results for input(s): HGBA1C in the last 72 hours. CBG:  Recent Labs Lab 01/06/17 1204 01/06/17 1743 01/06/17 2112 01/07/17 0629 01/07/17 1229  GLUCAP 128* 124* 111* 119* 112*   Lipid Profile:  Recent Labs  01/05/17 0009  CHOL 135  HDL 40*  LDLCALC  68  TRIG 137  CHOLHDL 3.4   Thyroid Function Tests: No results for input(s): TSH, T4TOTAL, FREET4, T3FREE, THYROIDAB in the last 72 hours. Anemia Panel: No results for input(s): VITAMINB12, FOLATE, FERRITIN, TIBC, IRON, RETICCTPCT in the last 72 hours. Urine analysis:    Component Value Date/Time   COLORURINE STRAW (A) 01/04/2017 1229   APPEARANCEUR CLEAR 01/04/2017 1229   LABSPEC 1.012 01/04/2017 1229   PHURINE 5.0 01/04/2017 1229   GLUCOSEU NEGATIVE 01/04/2017 1229   HGBUR NEGATIVE 01/04/2017 1229   BILIRUBINUR NEGATIVE 01/04/2017 1229   KETONESUR NEGATIVE 01/04/2017 1229   PROTEINUR NEGATIVE 01/04/2017 1229   UROBILINOGEN 0.2 10/22/2014 1052   NITRITE NEGATIVE 01/04/2017 1229   LEUKOCYTESUR NEGATIVE 01/04/2017 1229     Radiology Studies: Dg Chest 2 View 01/04/2017 Mild scarring left upper lobe. No edema or consolidation. Stable cardiomegaly.   Ct Head Wo Contrast 01/04/2017 No acute intracranial abnormality.   Mr Brain Wo Contrast 01/04/2017 1.  No acute intracranial abnormality. 2. Stable noncontrast MRI appearance of the brain since 2017 with mild for age signal changes most compatible with chronic small vessel disease. 3. No intracranial artery stenosis and negative intracranial MRA aside from generalized arterial tortuosity and normal anatomic variation of the right ICA terminus and the ACA anatomy.   Mr Jodene Nam Head/brain Wo  Cm 01/04/2017  1.  No acute intracranial abnormality. 2. Stable noncontrast MRI appearance of the brain since 2017 with mild for age signal changes most compatible with chronic small vessel disease. 3. No intracranial artery stenosis and negative intracranial MRA aside from generalized arterial tortuosity and normal anatomic variation of the right ICA terminus and the ACA anatomy. Electronically Signed   By: Genevie Ann M.D.   On: 01/04/2017 14:18     Scheduled Meds: . amiodarone  200 mg Oral Daily  . enoxaparin (LOVE  40 mg Subcutaneous Q24H  . famotidine  10 mg Oral Daily  . ferrous sulfate  325 mg Oral BID WC  . insulin aspart  0-5 Units Subcutaneous QHS  . insulin aspart  0-9 Units Subcutaneous TID WC  . pantoprazole  40 mg Oral Daily  . simvastatin  20 mg Oral q1800  . tamsulosin  0.4 mg Oral QPC supper   Continuous Infusions:    LOS: 2 days    Time spent: 25 minutes  Greater than 50% of the time spent on counseling and coordinating the care.   Vernell Leep, MD, FACP, FHM. [facility] Pager [phone]  If 7PM-7AM, please contact night-coverage [email or web address] Password [facility] 01/07/2017, 2:56 PM

## 2017-01-07 NOTE — Progress Notes (Addendum)
Notified [name] [name] CSW of need for SNF back up, anticipate DC tomorrow per Dr Algis Liming. Patient states he has had several falls at home, neuropathy inhibiting ability to walk. Patient states that he did not have enough strength to finish standing at the toilet and almost fell last night. Patient states that neuropathic pain continues today.

## 2017-01-07 NOTE — Progress Notes (Signed)
Patient's BP is 96/34.  RN contacted provider who advised to give 250 ml NS Bolus and then recheck patient's BP.  RN will continue to monitor

## 2017-01-08 DIAGNOSIS — R278 Other lack of coordination: Secondary | ICD-10-CM | POA: Diagnosis not present

## 2017-01-08 DIAGNOSIS — I480 Paroxysmal atrial fibrillation: Secondary | ICD-10-CM | POA: Diagnosis not present

## 2017-01-08 DIAGNOSIS — I10 Essential (primary) hypertension: Secondary | ICD-10-CM | POA: Diagnosis not present

## 2017-01-08 DIAGNOSIS — E0842 Diabetes mellitus due to underlying condition with diabetic polyneuropathy: Secondary | ICD-10-CM | POA: Diagnosis not present

## 2017-01-08 DIAGNOSIS — D6489 Other specified anemias: Secondary | ICD-10-CM | POA: Diagnosis not present

## 2017-01-08 DIAGNOSIS — N183 Chronic kidney disease, stage 3 (moderate): Secondary | ICD-10-CM | POA: Diagnosis not present

## 2017-01-08 DIAGNOSIS — N320 Bladder-neck obstruction: Secondary | ICD-10-CM | POA: Diagnosis not present

## 2017-01-08 DIAGNOSIS — R29898 Other symptoms and signs involving the musculoskeletal system: Secondary | ICD-10-CM | POA: Diagnosis not present

## 2017-01-08 DIAGNOSIS — R54 Age-related physical debility: Secondary | ICD-10-CM | POA: Diagnosis not present

## 2017-01-08 DIAGNOSIS — I2510 Atherosclerotic heart disease of native coronary artery without angina pectoris: Secondary | ICD-10-CM | POA: Diagnosis not present

## 2017-01-08 DIAGNOSIS — E1121 Type 2 diabetes mellitus with diabetic nephropathy: Secondary | ICD-10-CM | POA: Diagnosis not present

## 2017-01-08 DIAGNOSIS — F0390 Unspecified dementia without behavioral disturbance: Secondary | ICD-10-CM | POA: Diagnosis not present

## 2017-01-08 DIAGNOSIS — M6281 Muscle weakness (generalized): Secondary | ICD-10-CM | POA: Diagnosis not present

## 2017-01-08 DIAGNOSIS — I482 Chronic atrial fibrillation: Secondary | ICD-10-CM | POA: Diagnosis not present

## 2017-01-08 DIAGNOSIS — R2689 Other abnormalities of gait and mobility: Secondary | ICD-10-CM | POA: Diagnosis not present

## 2017-01-08 DIAGNOSIS — R41841 Cognitive communication deficit: Secondary | ICD-10-CM | POA: Diagnosis not present

## 2017-01-08 DIAGNOSIS — R2681 Unsteadiness on feet: Secondary | ICD-10-CM | POA: Diagnosis not present

## 2017-01-08 DIAGNOSIS — E1129 Type 2 diabetes mellitus with other diabetic kidney complication: Secondary | ICD-10-CM | POA: Diagnosis not present

## 2017-01-08 LAB — GLUCOSE, CAPILLARY
Glucose-Capillary: 124 mg/dL — ABNORMAL HIGH (ref 65–99)
Glucose-Capillary: 152 mg/dL — ABNORMAL HIGH (ref 65–99)

## 2017-01-08 MED ORDER — ACETAMINOPHEN 500 MG PO TABS: 500.0000 mg | ORAL_TABLET | Freq: Four times a day (QID) | ORAL | Status: DC | PRN

## 2017-01-08 NOTE — Clinical Social Work Note (Signed)
Pt is ready for discharge today and will go to [facility]. Facility is ready to accept pt as they received discharge information. Pt and family aware and agreeable to discharge plan. RN will provide transportation. Pt's family will provide transportation. CSW is signing off as no further needs identified.   Darden Dates, MSW, LCSW Clinical Social Worker  [phone]

## 2017-01-08 NOTE — Progress Notes (Signed)
Report called to [facility] accepting nurse. Family transported patient to facility.

## 2017-01-08 NOTE — Discharge Summary (Signed)
Physician Discharge Summary  [name] [name] WJX:[id number] DOB: 03-May-1933  PCP: [name], MD  Admit date: 01/04/2017 Discharge date: 01/08/2017  Recommendations for Outpatient Follow-up:  1. M.D. at SNF in 2 days with repeat labs (CBC & BMP). 2. Dr. [name], PCP upon discharge from SNF.  Home Health: None Equipment/Devices: None    Discharge Condition: Improved and stable  CODE STATUS: DO NOT RESUSCITATE  Diet recommendation: Heart healthy & diabetic diet.  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 fib, ulcerative colitis status post colectomy/colostomy, chronic kidney disease stage 3 with baseline creatinine of 2 back in 21/3086, chronic diastolic and systolic heart failure (ECHO in 01/2016 showed EF 40-45% with grade 1 DD), CAD,GI bleed, hypertension, anemia who presented to [facility] with left leg weakness and numbness started around 1:30 am the morning of the admission. It had progressed over the next couple of hours and around 6:30 am he could not bear weight on this leg. At baseline he walks with the walker and lives alone. No other complaints such as chest pain or fever or slurred speech. He was seen by stroke team. MRI brain showed no acute stroke.  Assessment & Plan:   Principal Problem: Bilateral lower extremity weakness - He initially presented with what appeared to be left leg weakness and numbness. - However upon neurology evaluation, he reported  that he was getting ready to go out to eat on the morning of admission when his legs gave out from underneath him and he fell to the floor area did he was unable to get up and pressed his medical alert button. EMS arrived and he was subsequently transported to the hospital for evaluation. He reported some chronic numbness and weakness in both legs which can be worse in one leg or the other at any given time. On the day prior to admission, he felt as if these were worse in the left leg and on the morning off admission he felt like his left leg was weaker. He has had several falls at home over the past year according to family. He was advised to use a walker for ambulation. She does not use a walker when he is at home but uses it when he gets outside his house. He denied any injury related to the fall. - CT head with no acute intracranial findings - MRI brain showed no acute stroke - Neurology consultation and follow-up appreciated and indicate that his bilateral lower extremity weakness is chronic consistent with deconditioning with possible contribution from severe underlying polyneuropathy but no findings to suggest acute stroke. -PT evaluated and recommended SNF for rehabilitation. He is unsafe to return home by himself given history of frequent falls related to his lower extremity weakness.  Active Problems:   Cerebrovascular disease - No acute issues - Pt has chronic small vessel change on MRI scan - Continue with risk factor modification - Continue aspirin and statin - As per neurology consultation, no need for acute stroke workup as he has not  had a stroke this admission.    Atrial fibrillation, paroxysmal - CHADS vasc score 5 - Continue amiodarone - On aspirin; not on other AC due to history of GI bleed and fall risk    Chronic diastolic and systolic heart failure  - ECHO in 01/2016 showed EF 40-45% with grade 1 DD - Compensated     Ulcerative colitis ([place]) - S/P colostomy     CAD status post drug-eluting stent in 2015 - Stable - Continue aspirin    Chronic kidney disease stage 3 - Baseline cr in 2017 was around 2 - Cr is within baseline range     Anemia of chronic kidney disease - Hgb stable     Diabetes mellitus with diabetic polynephropathy without long term insulin use - Actos and januvia to continue at DC - HgA1c 6.5 on this admission indicating good glycemic control - As per neurology follow-up, his polyneuropathy is chronic and severe. They indicated that he may benefit from a trial of gabapentin given his burning pain in both feet.  - Started low-dose dose gabapentin with improvement in neuropathic pain. This can be titrated as outpatient at SNF as deemed necessary. Periodic follow-up of CBCs & LFT's.    Dyslipidemia associated with type 2 DM - Continue statin therapy     Essential hypertension - Metoprolol was on patient's initial med list and was continued in the hospital. However today he indicated that he does not take metoprolol. Pharmacy reconciled his home medication list and he truly was not taking metoprolol. This was discontinued.   Consultants:   WOC for colostomy   Neurology -Signed off.  Procedures:  None   Discharge Instructions  Discharge Instructions    Call MD for:    Complete by:  As directed    Strokelike symptoms.   Call MD for:  extreme fatigue    Complete by:  As directed    Call MD for:  persistant dizziness or light-headedness    Complete by:  As directed    Call MD for:  severe uncontrolled pain    Complete by:  As directed    Diet - low sodium heart healthy    Complete by:  As directed    Diet Carb Modified    Complete by:  As directed    Increase activity slowly    Complete by:  As directed        Medication List    STOP taking these medications   HYDROcodone-acetaminophen 5-325 MG tablet Commonly known as:  NORCO/VICODIN   metoprolol tartrate 25 MG tablet Commonly known as:   LOPRESSOR     TAKE these medications   acetaminophen 500 MG tablet Commonly known as:  TYLENOL Take 1 tablet (500 mg total) by mouth every 6 (six) hours as needed (for pain or headaches). Available over the counter What changed:  when to take this  reasons to take this   amiodarone 200 MG tablet Commonly known as:  PACERONE TAKE 1 TABLET (200 MG TOTAL) BY MOUTH DAILY.   aspirin 81 MG tablet Take 81 mg by mouth daily.   calcium carbonate 600 MG Tabs tablet Commonly known as:  OS-CAL Take 600 mg by mouth daily with breakfast.   cholecalciferol 1000 units tablet Commonly known as:  VITAMIN D Take 3,000 Units by mouth daily.   ferrous sulfate 325 (65 FE) MG tablet Take 1 tablet (325 mg total) by mouth 2 (two) times daily with a meal.   gabapentin 100 MG capsule  Commonly known as:  NEURONTIN Take 1 capsule (100 mg total) by mouth 2 (two) times daily.   omeprazole 20 MG capsule Commonly known as:  PRILOSEC TAKE 1 CAPSULE (20 MG TOTAL) BY MOUTH DAILY.   pioglitazone 30 MG tablet Commonly known as:  ACTOS Take 1 tablet (30 mg total) by mouth daily.   saxagliptin HCl 5 MG Tabs tablet Commonly known as:  ONGLYZA Take 1 tablet (5 mg total) by mouth daily.   simvastatin 20 MG tablet Commonly known as:  ZOCOR Take 20 mg by mouth daily.   tamsulosin 0.4 MG Caps capsule Commonly known as:  FLOMAX Take 1 capsule (0.4 mg total) by mouth daily after supper.      Follow-up Information    MD at SNF. Schedule an appointment as soon as possible for a visit in 2 day(s).   Why:  to be seen with repeat labs (CBC & BMP).       [name], MD. Schedule an appointment as soon as possible for a visit.   Specialty:  Internal Medicine Why:  Upon DC from SNF. Contact information: [address] [place] [address] [phone]          Allergies  Allergen Reactions  . Brilinta [Ticagrelor] Shortness Of Breath    Changed to Plavix due to SOB  .  Penicillins Rash    Has patient had a PCN reaction causing immediate rash, facial/tongue/throat swelling, SOB or lightheadedness with hypotension:Yes Has patient had a PCN reaction causing severe rash involving mucus membranes or skin necrosis: No Has patient had a PCN reaction that required hospitalization: No Has patient had a PCN reaction occurring within the last 10 years: No If all of the above answers are "NO", then may proceed with Cephalosporin use.  . Sulfa Antibiotics Rash      Procedures/Studies: Dg Chest 2 View  Result Date: 01/04/2017 CLINICAL DATA:  Left-sided weakness.  Hypertension. EXAM: CHEST  2 VIEW COMPARISON:  January 14, 2016 FINDINGS: There is slight scarring in the left upper lobe, stable. There is no edema or consolidation. There is cardiomegaly with pulmonary vascularity within normal limits. No adenopathy. There is degenerative change in the lower thoracic region. IMPRESSION: Mild scarring left upper lobe. No edema or consolidation. Stable cardiomegaly. Electronically Signed   By: Lowella Grip III M.D.   On: 01/04/2017 11:46   Ct Head Wo Contrast  Result Date: 01/04/2017 CLINICAL DATA:  Leg weakness.  Fall today. EXAM: CT HEAD WITHOUT CONTRAST TECHNIQUE: Contiguous axial images were obtained from the base of the skull through the vertex without intravenous contrast. COMPARISON:  01/14/2016 FINDINGS: Brain: No acute intracranial abnormality. Specifically, no hemorrhage, hydrocephalus, mass lesion, acute infarction, or significant intracranial injury. Vascular: No hyperdense vessel or unexpected calcification. Skull: No acute calvarial abnormality. Sinuses/Orbits: Mucosal thickening in the paranasal sinuses. Old left medial orbital wall blowout fracture an nasal bone fractures. Mastoid air cells are clear. Other: None IMPRESSION: No acute intracranial abnormality. Electronically Signed   By: Rolm Baptise M.D.   On: 01/04/2017 10:48   Mr Brain Wo Contrast  Result  Date: 01/04/2017 CLINICAL DATA:  83 year old male with left side weakness. Fall. Left leg weakness and numbness. Initial encounter. EXAM: MRI HEAD WITHOUT CONTRAST MRA HEAD WITHOUT CONTRAST TECHNIQUE: Multiplanar, multiecho pulse sequences of the brain and surrounding structures were obtained without intravenous contrast. Angiographic images of the head were obtained using MRA technique without contrast. COMPARISON:  Head CT 1037 hours today.  Brain MRI 01/14/2016.  FINDINGS: MRI HEAD FINDINGS Brain: No restricted diffusion to suggest acute infarction. No midline shift, mass effect, evidence of mass lesion, ventriculomegaly, extra-axial collection or acute intracranial hemorrhage. Cervicomedullary junction is within normal limits. Partially empty sella. Stable gray and white matter signal throughout the brain. Scattered mostly subcortical nonspecific cerebral white matter T2 and FLAIR hyperintensity is stable since 2017, mild to moderate for age. No cortical encephalomalacia or chronic cerebral blood products. T2 heterogeneity in the deep gray matter nuclei appears stable and in large part due to perivascular spaces. Small chronic left cerebellar infarct is stable. Negative brainstem. Vascular: Major intracranial vascular flow voids are stable, with mild generalized intracranial artery dolichoectasia. Skull and upper cervical spine: Negative. Degenerative ligamentous hypertrophy about the odontoid. Normal bone marrow signal. Sinuses/Orbits: Stable and negative orbits soft tissues. Chronic left lamina papyracea fracture. Chronic right maxillary sinusitis. Other: Visible internal auditory structures appear normal. Negative scalp soft tissues. MRA HEAD FINDINGS Antegrade flow in the posterior circulation with no distal vertebral artery stenosis. Normal PICA origins. Dominant left AICA. Normal vertebrobasilar junction and basilar artery aside from mild dolichoectasia. Normal SCA and PCA origins. Posterior communicating  arteries are diminutive or absent. Mildly tortuous left P1 segment. Normal PCA branches. Antegrade flow in both ICA siphons, the right is mildly dolichoectatic and has an unusual configuration of the terminus within early bifurcation of the distal right ICA into the ACA and MCA segments (series 454 image 8). Associated diminutive or absent left ACA A1 segment. No ICA siphon stenosis. Normal ophthalmic artery origins. Anterior communicating artery is fenestrated (normal variant). Visible ACA branches are within normal limits. Tortuous bilateral MCA M1 segments. Bilateral MCA bifurcations and visible bilateral MCA branches are normal aside from tortuosity. IMPRESSION: 1.  No acute intracranial abnormality. 2. Stable noncontrast MRI appearance of the brain since 2017 with mild for age signal changes most compatible with chronic small vessel disease. 3. No intracranial artery stenosis and negative intracranial MRA aside from generalized arterial tortuosity and normal anatomic variation of the right ICA terminus and the ACA anatomy. Electronically Signed   By: Genevie Ann M.D.   On: 01/04/2017 14:18   Mr Jodene Nam Head/brain OE Cm  Result Date: 01/04/2017 CLINICAL DATA:  83 year old male with left side weakness. Fall. Left leg weakness and numbness. Initial encounter. EXAM: MRI HEAD WITHOUT CONTRAST MRA HEAD WITHOUT CONTRAST TECHNIQUE: Multiplanar, multiecho pulse sequences of the brain and surrounding structures were obtained without intravenous contrast. Angiographic images of the head were obtained using MRA technique without contrast. COMPARISON:  Head CT 1037 hours today.  Brain MRI 01/14/2016. FINDINGS: MRI HEAD FINDINGS Brain: No restricted diffusion to suggest acute infarction. No midline shift, mass effect, evidence of mass lesion, ventriculomegaly, extra-axial collection or acute intracranial hemorrhage. Cervicomedullary junction is within normal limits. Partially empty sella. Stable gray and white matter signal  throughout the brain. Scattered mostly subcortical nonspecific cerebral white matter T2 and FLAIR hyperintensity is stable since 2017, mild to moderate for age. No cortical encephalomalacia or chronic cerebral blood products. T2 heterogeneity in the deep gray matter nuclei appears stable and in large part due to perivascular spaces. Small chronic left cerebellar infarct is stable. Negative brainstem. Vascular: Major intracranial vascular flow voids are stable, with mild generalized intracranial artery dolichoectasia. Skull and upper cervical spine: Negative. Degenerative ligamentous hypertrophy about the odontoid. Normal bone marrow signal. Sinuses/Orbits: Stable and negative orbits soft tissues. Chronic left lamina papyracea fracture. Chronic right maxillary sinusitis. Other: Visible internal auditory structures appear normal. Negative scalp soft  tissues. MRA HEAD FINDINGS Antegrade flow in the posterior circulation with no distal vertebral artery stenosis. Normal PICA origins. Dominant left AICA. Normal vertebrobasilar junction and basilar artery aside from mild dolichoectasia. Normal SCA and PCA origins. Posterior communicating arteries are diminutive or absent. Mildly tortuous left P1 segment. Normal PCA branches. Antegrade flow in both ICA siphons, the right is mildly dolichoectatic and has an unusual configuration of the terminus within early bifurcation of the distal right ICA into the ACA and MCA segments (series 454 image 8). Associated diminutive or absent left ACA A1 segment. No ICA siphon stenosis. Normal ophthalmic artery origins. Anterior communicating artery is fenestrated (normal variant). Visible ACA branches are within normal limits. Tortuous bilateral MCA M1 segments. Bilateral MCA bifurcations and visible bilateral MCA branches are normal aside from tortuosity. IMPRESSION: 1.  No acute intracranial abnormality. 2. Stable noncontrast MRI appearance of the brain since 2017 with mild for age signal  changes most compatible with chronic small vessel disease. 3. No intracranial artery stenosis and negative intracranial MRA aside from generalized arterial tortuosity and normal anatomic variation of the right ICA terminus and the ACA anatomy. Electronically Signed   By: Genevie Ann M.D.   On: 01/04/2017 14:18      Subjective: Patient states that his feet/leg pains are better. Denies dizziness, lightheadedness, chest pain or dyspnea. Indicates that he does not take metoprolol which was verified by pharmacy. As per RN, no acute issues. No other complaints reported. Did not get this morning dose of metoprolol.  Discharge Exam:  Vitals:   01/08/17 0100 01/08/17 0438 01/08/17 0500 01/08/17 0900  BP: (!) 119/44 (!) 107/46  (!) 102/41  Pulse: (!) 54 62  (!) 52  Resp: 20 20  (!) 21  Temp: 97.9 F (36.6 C) 97.9 F (36.6 C)  97.6 F (36.4 C)  TempSrc: Oral Oral  Oral  SpO2: 99% 98%  97%  Weight:   107.3 kg (236 lb 8 oz)   Height:        General exam: Appears calm and comfortable.Lying comfortably supine in bed.  Respiratory system: Clear to auscultation. Respiratory effort normal. Cardiovascular system: S1 & S2 heard, Rate controlled. No JVD, murmurs or pedal edema.  Gastrointestinal system: Abdomen is nondistended, soft and nontender. No organomegaly or masses felt. Normal bowel sounds heard. (+) Colostomy. Laparotomy scar.  Central nervous system: Alert and oriented. No focal neurological deficits. Extremities: Symmetric 5 x 5 power.  Skin: warm and dry skin Psychiatry: Judgement and insight appear normal. Mood & affect appropriate.     The results of significant diagnostics from this hospitalization (including imaging, microbiology, ancillary and laboratory) are listed below for reference.     Microbiology: No results found for this or any previous visit (from the past 240 hour(s)).   Labs: CBC:  Recent Labs Lab 01/04/17 1015 01/04/17 1023 01/05/17 0009 01/06/17 0420  WBC 6.5   --  6.2 6.0  NEUTROABS 4.9  --   --   --   HGB 10.9* 11.2* 9.7* 10.4*  HCT 34.3* 33.0* 30.5* 32.6*  MCV 94.5  --  94.7 94.2  PLT 230  --  202 366   Basic Metabolic Panel:  Recent Labs Lab 01/04/17 1015 01/04/17 1023 01/05/17 0009 01/06/17 0420  NA 138 137 134* 133*  K 5.0 5.1 4.7 4.7  CL 106 105 105 101  CO2 20*  --  23 23  GLUCOSE 148* 146* 108* 134*  BUN 31* 45* 28* 29*  CREATININE 1.68* 1.60*  1.56* 1.69*  CALCIUM 8.9  --  8.2* 8.4*   Liver Function Tests:  Recent Labs Lab 01/04/17 1015  AST 19  ALT 14*  ALKPHOS 87  BILITOT 0.6  PROT 6.3*  ALBUMIN 2.9*   BNP (last 3 results)  Recent Labs  01/14/16 1742  BNP 186.8*   CBG:  Recent Labs Lab 01/07/17 1229 01/07/17 1630 01/07/17 2121 01/08/17 0631 01/08/17 1202  GLUCAP 112* 132* 120* 124* 152*   Urinalysis    Component Value Date/Time   COLORURINE STRAW (A) 01/04/2017 1229   APPEARANCEUR CLEAR 01/04/2017 1229   LABSPEC 1.012 01/04/2017 1229   PHURINE 5.0 01/04/2017 1229   GLUCOSEU NEGATIVE 01/04/2017 1229   HGBUR NEGATIVE 01/04/2017 1229   BILIRUBINUR NEGATIVE 01/04/2017 1229   KETONESUR NEGATIVE 01/04/2017 1229   PROTEINUR NEGATIVE 01/04/2017 1229   UROBILINOGEN 0.2 10/22/2014 1052   NITRITE NEGATIVE 01/04/2017 1229   LEUKOCYTESUR NEGATIVE 01/04/2017 1229      Time coordinating discharge: Over 30 minutes  SIGNED:  Vernell Leep, MD, FACP, FHM. [facility] Pager [phone]  If 7PM-7AM, please contact night-coverage [email or web address] Password [facility] 01/08/2017, 12:45 PM

## 2017-01-08 NOTE — Progress Notes (Signed)
Pharmacy is called per MD request for patient's PTA meds re-checked.

## 2017-01-22 DIAGNOSIS — I480 Paroxysmal atrial fibrillation: Secondary | ICD-10-CM | POA: Diagnosis not present

## 2017-01-22 DIAGNOSIS — R2689 Other abnormalities of gait and mobility: Secondary | ICD-10-CM | POA: Diagnosis not present

## 2017-02-16 DIAGNOSIS — R531 Weakness: Secondary | ICD-10-CM | POA: Diagnosis not present

## 2017-02-16 DIAGNOSIS — R269 Unspecified abnormalities of gait and mobility: Secondary | ICD-10-CM | POA: Diagnosis not present

## 2017-02-16 DIAGNOSIS — E1340 Other specified diabetes mellitus with diabetic neuropathy, unspecified: Secondary | ICD-10-CM | POA: Diagnosis not present

## 2017-03-06 DIAGNOSIS — H35363 Drusen (degenerative) of macula, bilateral: Secondary | ICD-10-CM | POA: Diagnosis not present

## 2017-03-06 DIAGNOSIS — E113293 Type 2 diabetes mellitus with mild nonproliferative diabetic retinopathy without macular edema, bilateral: Secondary | ICD-10-CM | POA: Diagnosis not present

## 2017-03-06 DIAGNOSIS — H43813 Vitreous degeneration, bilateral: Secondary | ICD-10-CM | POA: Diagnosis not present

## 2017-03-06 DIAGNOSIS — H353132 Nonexudative age-related macular degeneration, bilateral, intermediate dry stage: Secondary | ICD-10-CM | POA: Diagnosis not present

## 2017-05-22 DIAGNOSIS — I10 Essential (primary) hypertension: Secondary | ICD-10-CM | POA: Diagnosis not present

## 2017-05-22 DIAGNOSIS — I5030 Unspecified diastolic (congestive) heart failure: Secondary | ICD-10-CM | POA: Diagnosis not present

## 2017-05-22 DIAGNOSIS — E782 Mixed hyperlipidemia: Secondary | ICD-10-CM | POA: Diagnosis not present

## 2017-05-22 DIAGNOSIS — R269 Unspecified abnormalities of gait and mobility: Secondary | ICD-10-CM | POA: Diagnosis not present

## 2017-05-22 DIAGNOSIS — E1122 Type 2 diabetes mellitus with diabetic chronic kidney disease: Secondary | ICD-10-CM | POA: Diagnosis not present

## 2017-05-22 DIAGNOSIS — E1165 Type 2 diabetes mellitus with hyperglycemia: Secondary | ICD-10-CM | POA: Diagnosis not present

## 2017-05-22 DIAGNOSIS — N400 Enlarged prostate without lower urinary tract symptoms: Secondary | ICD-10-CM | POA: Diagnosis not present

## 2017-05-22 DIAGNOSIS — K5190 Ulcerative colitis, unspecified, without complications: Secondary | ICD-10-CM | POA: Diagnosis not present

## 2017-05-22 DIAGNOSIS — I2510 Atherosclerotic heart disease of native coronary artery without angina pectoris: Secondary | ICD-10-CM | POA: Diagnosis not present

## 2017-05-22 DIAGNOSIS — N183 Chronic kidney disease, stage 3 (moderate): Secondary | ICD-10-CM | POA: Diagnosis not present

## 2017-05-22 DIAGNOSIS — E1340 Other specified diabetes mellitus with diabetic neuropathy, unspecified: Secondary | ICD-10-CM | POA: Diagnosis not present

## 2017-05-22 DIAGNOSIS — I4891 Unspecified atrial fibrillation: Secondary | ICD-10-CM | POA: Diagnosis not present

## 2017-06-11 ENCOUNTER — Other Ambulatory Visit: Payer: Self-pay | Admitting: Cardiology

## 2017-06-14 DIAGNOSIS — S51011A Laceration without foreign body of right elbow, initial encounter: Secondary | ICD-10-CM | POA: Diagnosis not present

## 2017-06-14 DIAGNOSIS — R600 Localized edema: Secondary | ICD-10-CM | POA: Diagnosis not present

## 2017-06-22 ENCOUNTER — Other Ambulatory Visit: Payer: Self-pay | Admitting: Internal Medicine

## 2017-06-22 DIAGNOSIS — R600 Localized edema: Secondary | ICD-10-CM | POA: Diagnosis not present

## 2017-06-22 DIAGNOSIS — N183 Chronic kidney disease, stage 3 (moderate): Secondary | ICD-10-CM | POA: Diagnosis not present

## 2017-06-22 DIAGNOSIS — M79671 Pain in right foot: Secondary | ICD-10-CM | POA: Diagnosis not present

## 2017-06-22 DIAGNOSIS — R609 Edema, unspecified: Secondary | ICD-10-CM

## 2017-06-22 DIAGNOSIS — R52 Pain, unspecified: Secondary | ICD-10-CM

## 2017-06-23 ENCOUNTER — Ambulatory Visit
Admission: RE | Admit: 2017-06-23 | Discharge: 2017-06-23 | Disposition: A | Discharge status: Home / Self Care | Payer: Medicare Other | Source: Ambulatory Visit | Attending: Internal Medicine | Admitting: Internal Medicine

## 2017-06-23 DIAGNOSIS — R600 Localized edema: Secondary | ICD-10-CM | POA: Diagnosis not present

## 2017-06-23 DIAGNOSIS — R609 Edema, unspecified: Secondary | ICD-10-CM

## 2017-06-23 DIAGNOSIS — R52 Pain, unspecified: Secondary | ICD-10-CM

## 2017-06-24 ENCOUNTER — Other Ambulatory Visit: Payer: Self-pay | Admitting: Cardiology

## 2017-06-26 NOTE — Telephone Encounter (Signed)
Medication Detail    Disp Refills Start End   amiodarone (PACERONE) 200 MG tablet 90 tablet 0 06/12/2017    Sig: TAKE 1 TABLET BY MOUTH EVERY DAY   Sent to pharmacy as: amiodarone (PACERONE) 200 MG tablet   Notes to Pharmacy: Please call our office to schedule an yearly appointment with Dr. Radford Pax for October for future refills. [phone]. Thank you 1st attempt   E-Prescribing Status: Receipt confirmed by pharmacy (06/12/2017 1:15 PM EDT)   Pharmacy   [id number]- [place], [place]

## 2017-06-29 ENCOUNTER — Encounter (INDEPENDENT_AMBULATORY_CARE_PROVIDER_SITE_OTHER): Payer: Self-pay | Admitting: Orthopedic Surgery

## 2017-06-29 ENCOUNTER — Ambulatory Visit (INDEPENDENT_AMBULATORY_CARE_PROVIDER_SITE_OTHER): Payer: Medicare Other | Admitting: Orthopedic Surgery

## 2017-06-29 DIAGNOSIS — E1142 Type 2 diabetes mellitus with diabetic polyneuropathy: Secondary | ICD-10-CM

## 2017-06-29 DIAGNOSIS — M6702 Short Achilles tendon (acquired), left ankle: Secondary | ICD-10-CM | POA: Diagnosis not present

## 2017-06-29 DIAGNOSIS — I87323 Chronic venous hypertension (idiopathic) with inflammation of bilateral lower extremity: Secondary | ICD-10-CM | POA: Diagnosis not present

## 2017-06-29 DIAGNOSIS — M6701 Short Achilles tendon (acquired), right ankle: Secondary | ICD-10-CM

## 2017-06-29 NOTE — Progress Notes (Signed)
Office Visit Note   Patient: [name] [name]           Date of Birth: 19-Feb-1933           MRN: [id number] Visit Date: 06/29/2017              Requested by: [name], MD [address] [place], [place] [address] PCP: [name], MD  Chief Complaint  Patient presents with  . Right Foot - Pain  . Left Foot - Pain      HPI: Patient is a 83-year-old gentleman who complains of bilateral foot pain primarily across the metatarsal heads. Patient has diabetic orthotics extra depth shoes. Patient states that he currently is on Lyrica for neuropathy. He was briefly on Lasix for his venous stasis swelling. Patient states that the swelling goes down at night with elevation but gets worse during the day. Patient states he has to wear that his hightop shoes.  Assessment & Plan: Visit Diagnoses:  1. Achilles tendon contracture, bilateral   2. Idiopathic chronic venous hypertension of both lower extremities with inflammation   3. Diabetic polyneuropathy associated with type 2 diabetes mellitus ([place])     Plan: Patient was given instruction and demonstrated heel cord stretching he will go to [employer] for knee-high compression stockings 15-20 mmHg. He will wear these during the day ensure there are no wrinkles. Discussed that he has recurrent callus or ulcerations to call and follow up immediately.  Follow-Up Instructions: Return if symptoms worsen or fail to improve.   Ortho Exam  Patient is alert, oriented, no adenopathy, well-dressed, normal affect, normal respiratory effort. Examination patient has an antalgic gait. He has venous stasis swelling in both legs with pitting edema up to the tibial tubercle. There are no open ulcers no drainage. Patient has a good dorsalis pedis pulse bilaterally. He has heel cord contracture with dorsiflexion just short of neutral bilaterally. He has clawing of the toes with distal migration of the fat pad and essentially skin over  the metatarsal heads. He has a callus from increased pressure over the first metatarsal head right foot and after informed consent a 10 blade knife was used. The callus there is no ulcers.  Imaging: No results found. No images are attached to the encounter.  Labs: Lab Results  Component Value Date   HGBA1C 6.5 (H) 01/04/2017   HGBA1C 6.8 (H) 01/15/2016   HGBA1C 6.7 (H) 10/23/2014   REPTSTATUS 10/31/2014 FINAL 10/24/2014   CULT  10/24/2014    NO GROWTH 5 DAYS Performed at [facility]     Orders:  No orders of the defined types were placed in this encounter.  No orders of the defined types were placed in this encounter.    Procedures: No procedures performed  Clinical Data: No additional findings.  ROS:  All other systems negative, except as noted in the HPI. Review of Systems  Objective: Vital Signs: There were no vitals taken for this visit.  Specialty Comments:  No specialty comments available.  PMFS History: Patient Active Problem List   Diagnosis Date Noted  . Achilles tendon contracture, bilateral 06/29/2017  . Idiopathic chronic venous hypertension of both lower extremities with inflammation 06/29/2017  . Diabetic polyneuropathy associated with type 2 diabetes mellitus ([place]) 06/29/2017  . Diabetes mellitus with diabetic nephropathy without long-term current use of insulin ([place]) 01/05/2017  . Left-sided weakness 01/04/2017  . CKD (chronic kidney disease), stage III   . Acute on chronic  combined systolic and diastolic CHF (congestive heart failure) ([place])   . Paroxysmal atrial fibrillation ([name])   . Bleeding gastrointestinal   . Dyslipidemia 10/14/2014  . CAD in native artery- total LAD, s/p RI DES 06/19/14 07/11/2014  . Benign essential HTN 10/02/2013  . Ulcerative colitis ([place])    Past Medical History:  Diagnosis Date  . Anemia   . Atrial flutter ([place])    a. s/p RFCA of counterclockwise cavo-tricuspid isthmus dependent atrial flutter 2009 by  Dr. Rayann Heman.  Marland Kitchen CAD (coronary artery disease) 06/2014   a. 06/2014: abnl nuc. Cath: Totally occluded LAD with faint collaterals (not a candidate for CTO PCI), 90% ramus s/p DES, 50-70% OM2.  . Chronic diastolic CHF (congestive heart failure) ([place])    a. 2D Echo 10/23/13: EF 50-55%, basal mid-inf HK, grade 2 DD, mild MR, mod dilated LA, no sig change from prior.  . CKD (chronic kidney disease), stage III   . Complication of anesthesia    " during my kidney stone surgery my heart went out of rhythm  . Compression fracture of L1 lumbar vertebra ([name])   . Diabetes ([place])    TYPE 2   . Dyslipidemia   . Dyspnea   . Frequent PVCs   . GERD (gastroesophageal reflux disease)   . History of blood transfusion   . History of peptic ulcer disease   . HTN (hypertension)   . Hx of acute renal failure 01/02/10-3/24-11   due to hypovolemic shock, gastroenteritis and dehydration,hospitalized . Did requre a few days of dialysis. Cr at discharge was 1.8  . Kidney disease   . Kidney stone may 2009   Right hydronephrosis, S/P stone removal  . Lower back pain   . Macular degeneration   . OA (osteoarthritis) of hip   . Obesity   . Osteopenia   . PAF (paroxysmal atrial fibrillation) ([name])    not on long term anticoagulation due to history of heme positive stools and anemia  . Shingles    episode  . Small bowel obstruction, partial ([place]) 2009   Episode  . Ulcerative colitis ([place])    a. s/p total colectomy remotely.    Family History  Problem Relation Age of Onset  . Colon cancer Mother     Past Surgical History:  Procedure Laterality Date  . BACK SURGERY    . CARDIAC CATHETERIZATION  06/19/2014  . COLON RESECTION    . CORONARY ANGIOPLASTY    . CORONARY STENT PLACEMENT  06/19/2014   DES       dr Martinique  . HERNIA REPAIR    . LEFT AND RIGHT HEART CATHETERIZATION WITH CORONARY ANGIOGRAM N/A 06/19/2014   Procedure: LEFT AND RIGHT HEART CATHETERIZATION WITH CORONARY ANGIOGRAM;  Surgeon: Peter M Martinique, MD;   Location[facility];  Service: Cardiovascular;  Laterality: N/A;  . TONSILLECTOMY     Social History   Occupational History  . Not on file.   Social History Main Topics  . Smoking status: Never Smoker  . Smokeless tobacco: Never Used  . Alcohol use No  . Drug use: No  . Sexual activity: Not on file

## 2017-07-03 ENCOUNTER — Other Ambulatory Visit: Payer: Self-pay

## 2017-07-03 MED ORDER — AMIODARONE HCL 200 MG PO TABS: 200.0000 mg | ORAL_TABLET | Freq: Every day | ORAL | 3 refills | Status: DC

## 2017-07-24 DIAGNOSIS — Z23 Encounter for immunization: Secondary | ICD-10-CM | POA: Diagnosis not present

## 2017-10-23 DIAGNOSIS — M79672 Pain in left foot: Secondary | ICD-10-CM | POA: Diagnosis not present

## 2017-10-23 DIAGNOSIS — M545 Low back pain: Secondary | ICD-10-CM | POA: Diagnosis not present

## 2017-10-23 DIAGNOSIS — L84 Corns and callosities: Secondary | ICD-10-CM | POA: Diagnosis not present

## 2017-10-24 ENCOUNTER — Other Ambulatory Visit: Payer: Self-pay

## 2017-10-24 ENCOUNTER — Encounter (HOSPITAL_COMMUNITY): Payer: Self-pay

## 2017-10-24 ENCOUNTER — Other Ambulatory Visit (INDEPENDENT_AMBULATORY_CARE_PROVIDER_SITE_OTHER): Payer: Self-pay | Admitting: Orthopedic Surgery

## 2017-10-24 ENCOUNTER — Inpatient Hospital Stay (HOSPITAL_COMMUNITY): Payer: Medicare Other

## 2017-10-24 ENCOUNTER — Inpatient Hospital Stay (HOSPITAL_COMMUNITY)
Admission: EM | Admit: 2017-10-24 | Discharge: 2017-10-27 | DRG: 493 | Disposition: A | Discharge status: Skilled Nursing Facility | Payer: Medicare Other | Attending: Family Medicine | Admitting: Family Medicine

## 2017-10-24 ENCOUNTER — Emergency Department (HOSPITAL_COMMUNITY): Payer: Medicare Other

## 2017-10-24 DIAGNOSIS — N400 Enlarged prostate without lower urinary tract symptoms: Secondary | ICD-10-CM | POA: Diagnosis present

## 2017-10-24 DIAGNOSIS — I130 Hypertensive heart and chronic kidney disease with heart failure and stage 1 through stage 4 chronic kidney disease, or unspecified chronic kidney disease: Secondary | ICD-10-CM | POA: Diagnosis present

## 2017-10-24 DIAGNOSIS — Z880 Allergy status to penicillin: Secondary | ICD-10-CM

## 2017-10-24 DIAGNOSIS — S3992XA Unspecified injury of lower back, initial encounter: Secondary | ICD-10-CM | POA: Diagnosis not present

## 2017-10-24 DIAGNOSIS — R41841 Cognitive communication deficit: Secondary | ICD-10-CM | POA: Diagnosis not present

## 2017-10-24 DIAGNOSIS — E1122 Type 2 diabetes mellitus with diabetic chronic kidney disease: Secondary | ICD-10-CM | POA: Diagnosis present

## 2017-10-24 DIAGNOSIS — M810 Age-related osteoporosis without current pathological fracture: Secondary | ICD-10-CM | POA: Diagnosis present

## 2017-10-24 DIAGNOSIS — D62 Acute posthemorrhagic anemia: Secondary | ICD-10-CM | POA: Diagnosis not present

## 2017-10-24 DIAGNOSIS — Z6835 Body mass index (BMI) 35.0-35.9, adult: Secondary | ICD-10-CM | POA: Diagnosis not present

## 2017-10-24 DIAGNOSIS — E785 Hyperlipidemia, unspecified: Secondary | ICD-10-CM | POA: Diagnosis not present

## 2017-10-24 DIAGNOSIS — M25572 Pain in left ankle and joints of left foot: Secondary | ICD-10-CM | POA: Diagnosis not present

## 2017-10-24 DIAGNOSIS — E1142 Type 2 diabetes mellitus with diabetic polyneuropathy: Secondary | ICD-10-CM | POA: Diagnosis present

## 2017-10-24 DIAGNOSIS — E669 Obesity, unspecified: Secondary | ICD-10-CM | POA: Diagnosis present

## 2017-10-24 DIAGNOSIS — D509 Iron deficiency anemia, unspecified: Secondary | ICD-10-CM | POA: Diagnosis present

## 2017-10-24 DIAGNOSIS — Z7982 Long term (current) use of aspirin: Secondary | ICD-10-CM

## 2017-10-24 DIAGNOSIS — I4819 Other persistent atrial fibrillation: Secondary | ICD-10-CM | POA: Diagnosis present

## 2017-10-24 DIAGNOSIS — R2681 Unsteadiness on feet: Secondary | ICD-10-CM | POA: Diagnosis not present

## 2017-10-24 DIAGNOSIS — D631 Anemia in chronic kidney disease: Secondary | ICD-10-CM | POA: Diagnosis present

## 2017-10-24 DIAGNOSIS — S82409A Unspecified fracture of shaft of unspecified fibula, initial encounter for closed fracture: Secondary | ICD-10-CM | POA: Diagnosis present

## 2017-10-24 DIAGNOSIS — G629 Polyneuropathy, unspecified: Secondary | ICD-10-CM | POA: Diagnosis not present

## 2017-10-24 DIAGNOSIS — Z955 Presence of coronary angioplasty implant and graft: Secondary | ICD-10-CM | POA: Diagnosis not present

## 2017-10-24 DIAGNOSIS — Z888 Allergy status to other drugs, medicaments and biological substances status: Secondary | ICD-10-CM | POA: Diagnosis not present

## 2017-10-24 DIAGNOSIS — S82852D Displaced trimalleolar fracture of left lower leg, subsequent encounter for closed fracture with routine healing: Secondary | ICD-10-CM | POA: Diagnosis not present

## 2017-10-24 DIAGNOSIS — K219 Gastro-esophageal reflux disease without esophagitis: Secondary | ICD-10-CM | POA: Diagnosis present

## 2017-10-24 DIAGNOSIS — Z9181 History of falling: Secondary | ICD-10-CM | POA: Diagnosis not present

## 2017-10-24 DIAGNOSIS — S82852A Displaced trimalleolar fracture of left lower leg, initial encounter for closed fracture: Principal | ICD-10-CM | POA: Diagnosis present

## 2017-10-24 DIAGNOSIS — E1121 Type 2 diabetes mellitus with diabetic nephropathy: Secondary | ICD-10-CM | POA: Diagnosis not present

## 2017-10-24 DIAGNOSIS — I5043 Acute on chronic combined systolic (congestive) and diastolic (congestive) heart failure: Secondary | ICD-10-CM | POA: Diagnosis present

## 2017-10-24 DIAGNOSIS — I2510 Atherosclerotic heart disease of native coronary artery without angina pectoris: Secondary | ICD-10-CM | POA: Diagnosis present

## 2017-10-24 DIAGNOSIS — W19XXXA Unspecified fall, initial encounter: Secondary | ICD-10-CM | POA: Diagnosis not present

## 2017-10-24 DIAGNOSIS — S82832A Other fracture of upper and lower end of left fibula, initial encounter for closed fracture: Secondary | ICD-10-CM | POA: Diagnosis not present

## 2017-10-24 DIAGNOSIS — I480 Paroxysmal atrial fibrillation: Secondary | ICD-10-CM | POA: Diagnosis not present

## 2017-10-24 DIAGNOSIS — I5042 Chronic combined systolic (congestive) and diastolic (congestive) heart failure: Secondary | ICD-10-CM | POA: Diagnosis not present

## 2017-10-24 DIAGNOSIS — N183 Chronic kidney disease, stage 3 unspecified: Secondary | ICD-10-CM | POA: Diagnosis present

## 2017-10-24 DIAGNOSIS — M6389 Disorders of muscle in diseases classified elsewhere, multiple sites: Secondary | ICD-10-CM | POA: Diagnosis not present

## 2017-10-24 DIAGNOSIS — I110 Hypertensive heart disease with heart failure: Secondary | ICD-10-CM | POA: Diagnosis not present

## 2017-10-24 DIAGNOSIS — R278 Other lack of coordination: Secondary | ICD-10-CM | POA: Diagnosis not present

## 2017-10-24 DIAGNOSIS — Z4789 Encounter for other orthopedic aftercare: Secondary | ICD-10-CM | POA: Diagnosis not present

## 2017-10-24 DIAGNOSIS — S82852S Displaced trimalleolar fracture of left lower leg, sequela: Secondary | ICD-10-CM

## 2017-10-24 DIAGNOSIS — W19XXXD Unspecified fall, subsequent encounter: Secondary | ICD-10-CM | POA: Diagnosis not present

## 2017-10-24 DIAGNOSIS — M79609 Pain in unspecified limb: Secondary | ICD-10-CM | POA: Diagnosis not present

## 2017-10-24 DIAGNOSIS — S90929A Unspecified superficial injury of unspecified foot, initial encounter: Secondary | ICD-10-CM | POA: Diagnosis not present

## 2017-10-24 DIAGNOSIS — T148XXA Other injury of unspecified body region, initial encounter: Secondary | ICD-10-CM | POA: Diagnosis not present

## 2017-10-24 DIAGNOSIS — S82892A Other fracture of left lower leg, initial encounter for closed fracture: Secondary | ICD-10-CM | POA: Diagnosis not present

## 2017-10-24 DIAGNOSIS — Z882 Allergy status to sulfonamides status: Secondary | ICD-10-CM

## 2017-10-24 DIAGNOSIS — T7840XD Allergy, unspecified, subsequent encounter: Secondary | ICD-10-CM | POA: Diagnosis not present

## 2017-10-24 DIAGNOSIS — M6281 Muscle weakness (generalized): Secondary | ICD-10-CM | POA: Diagnosis not present

## 2017-10-24 DIAGNOSIS — S82832D Other fracture of upper and lower end of left fibula, subsequent encounter for closed fracture with routine healing: Secondary | ICD-10-CM | POA: Diagnosis not present

## 2017-10-24 DIAGNOSIS — Z79899 Other long term (current) drug therapy: Secondary | ICD-10-CM

## 2017-10-24 LAB — I-STAT CHEM 8, ED
BUN: 33 mg/dL — ABNORMAL HIGH (ref 6–20)
Calcium, Ion: 1.18 mmol/L (ref 1.15–1.40)
Chloride: 104 mmol/L (ref 101–111)
Creatinine, Ser: 1.3 mg/dL — ABNORMAL HIGH (ref 0.61–1.24)
Glucose, Bld: 119 mg/dL — ABNORMAL HIGH (ref 65–99)
HCT: 34 % — ABNORMAL LOW (ref 39.0–52.0)
Hemoglobin: 11.6 g/dL — ABNORMAL LOW (ref 13.0–17.0)
Potassium: 4.8 mmol/L (ref 3.5–5.1)
Sodium: 138 mmol/L (ref 135–145)
TCO2: 25 mmol/L (ref 22–32)

## 2017-10-24 LAB — CBC WITH DIFFERENTIAL/PLATELET
Basophils Absolute: 0 10*3/uL (ref 0.0–0.1)
Basophils Relative: 0 %
Eosinophils Absolute: 0.2 10*3/uL (ref 0.0–0.7)
Eosinophils Relative: 3 %
HCT: 35.2 % — ABNORMAL LOW (ref 39.0–52.0)
Hemoglobin: 11.2 g/dL — ABNORMAL LOW (ref 13.0–17.0)
Lymphocytes Relative: 11 %
Lymphs Abs: 0.7 10*3/uL (ref 0.7–4.0)
MCH: 31.5 pg (ref 26.0–34.0)
MCHC: 31.8 g/dL (ref 30.0–36.0)
MCV: 99.2 fL (ref 78.0–100.0)
Monocytes Absolute: 0.4 10*3/uL (ref 0.1–1.0)
Monocytes Relative: 7 %
Neutro Abs: 4.5 10*3/uL (ref 1.7–7.7)
Neutrophils Relative %: 79 %
Platelets: 215 10*3/uL (ref 150–400)
RBC: 3.55 MIL/uL — ABNORMAL LOW (ref 4.22–5.81)
RDW: 14.6 % (ref 11.5–15.5)
WBC: 5.8 10*3/uL (ref 4.0–10.5)

## 2017-10-24 LAB — CBG MONITORING, ED
Glucose-Capillary: 105 mg/dL — ABNORMAL HIGH (ref 65–99)
Glucose-Capillary: 148 mg/dL — ABNORMAL HIGH (ref 65–99)

## 2017-10-24 MED ORDER — KETAMINE HCL-SODIUM CHLORIDE 100-0.9 MG/10ML-% IV SOSY: 0.3000 mg/kg | PREFILLED_SYRINGE | Freq: Once | INTRAVENOUS | Status: DC

## 2017-10-24 MED ORDER — FENTANYL CITRATE (PF) 100 MCG/2ML IJ SOLN
INTRAMUSCULAR | Status: AC | PRN
  Administered 2017-10-24: 25 ug via INTRAVENOUS

## 2017-10-24 MED ORDER — FENTANYL CITRATE (PF) 100 MCG/2ML IJ SOLN
INTRAMUSCULAR | Status: AC | PRN
  Administered 2017-10-24 (×2): 25 ug via INTRAVENOUS

## 2017-10-24 MED ORDER — SODIUM CHLORIDE 0.9 % IV SOLN
INTRAVENOUS | Status: AC
  Administered 2017-10-24: 21:00:00 via INTRAVENOUS

## 2017-10-24 MED ORDER — INSULIN ASPART 100 UNIT/ML ~~LOC~~ SOLN
0.0000 [IU] | Freq: Three times a day (TID) | SUBCUTANEOUS | Status: DC
  Administered 2017-10-25: 3 [IU] via SUBCUTANEOUS
  Administered 2017-10-26 (×3): 1 [IU] via SUBCUTANEOUS
  Administered 2017-10-27: 2 [IU] via SUBCUTANEOUS

## 2017-10-24 MED ORDER — ETOMIDATE 2 MG/ML IV SOLN
INTRAVENOUS | Status: AC | PRN
  Administered 2017-10-24: 3 mg via INTRAVENOUS

## 2017-10-24 MED ORDER — HYDROMORPHONE HCL 1 MG/ML IJ SOLN
1.0000 mg | Freq: Once | INTRAMUSCULAR | Status: AC
  Administered 2017-10-24: 1 mg via INTRAMUSCULAR
  Filled 2017-10-24: qty 1

## 2017-10-24 MED ORDER — ETOMIDATE 2 MG/ML IV SOLN
8.0000 mg | Freq: Once | INTRAVENOUS | Status: DC
  Filled 2017-10-24: qty 10

## 2017-10-24 MED ORDER — ETOMIDATE 2 MG/ML IV SOLN
INTRAVENOUS | Status: AC | PRN
Start: 2017-10-24 — End: 2017-10-24
  Administered 2017-10-24: 8 mg via INTRAVENOUS

## 2017-10-24 MED ORDER — GABAPENTIN 100 MG PO CAPS
100.0000 mg | ORAL_CAPSULE | Freq: Two times a day (BID) | ORAL | Status: DC
  Administered 2017-10-24 – 2017-10-27 (×4): 100 mg via ORAL
  Filled 2017-10-24 (×5): qty 1

## 2017-10-24 MED ORDER — FENTANYL CITRATE (PF) 100 MCG/2ML IJ SOLN
100.0000 ug | Freq: Once | INTRAMUSCULAR | Status: DC
  Filled 2017-10-24: qty 2

## 2017-10-24 MED ORDER — HYDROMORPHONE HCL 1 MG/ML IJ SOLN
1.0000 mg | INTRAMUSCULAR | Status: DC | PRN
  Administered 2017-10-24 – 2017-10-27 (×5): 1 mg via INTRAVENOUS
  Filled 2017-10-24 (×5): qty 1

## 2017-10-24 NOTE — H&P (Addendum)
History and Physical    [name] [name] WGN:[id number] DOB: 07/20/1933 DOA: 10/24/2017  Referring MD/NP/PA: Dr. Varney Biles  PCP: [name], MD   Patient coming from: home  Chief Complaint: pain in left ankle, fall  HPI: [name] is a 84 y.o. male with medical history significant for diabetes mellitus, diabetic polyneuropathy, CKD stage 3 with baseline Cr of around 2 in 2017, chronic combined CHF (ECHO in 01/2016 showed EF of 40-45% and grade 1 DD), history of GI bleed, Atrial fibrillation on amiodarone and aspirin (due to GI bleed not on any other AC). Pt was hospitalized in 12/2016 for LE weakness attributed to diabetic polyneuropathy and was discharged to SNF.   Patient now present to ED due to fall and left ankle pain. He was climbing onto the truck and slipped and fell. Patient reported pain that is 8/10 in intensity at worst, constant, sharp and worse with trying to bear weight on the left leg. Pain is non radiating and no specific alleviating factors although pain somewhat better with analgesia given in ED. No reports of lightheadedness or loss of consciousness or other prodromal symptoms such as shortness of breath, chest pain or palpitations. No abdominal pain, nausea or vomiting. No fevers or chills.  ED Course: In ED, pt was hemodynamically stable. BP was 164/86 and then 140/62. Blood work showed hgb of 11.6, Cr of 1.30. Lumbar spine x ray was without acute findings. Left akle x ray showed trimalleolar fracture dislocation of the ankle. Orthopedic surgery will see in consultation. Per ortho request, pt will be transferred to [facility].   Review of Systems:  Constitutional: Negative for fever, chills, diaphoresis, activity change, appetite change and fatigue.  HENT: Negative for ear pain, nosebleeds, congestion, facial swelling, rhinorrhea, neck pain, neck stiffness and ear discharge.   Eyes: Negative for pain, discharge, redness, itching and visual disturbance.  Respiratory:  Negative for cough, choking, chest tightness, shortness of breath, wheezing and stridor.   Cardiovascular: Negative for chest pain, palpitations and leg swelling.  Gastrointestinal: Negative for abdominal distention.  Genitourinary: Negative for dysuria, urgency, frequency, hematuria, flank pain, decreased urine volume, difficulty urinating and dyspareunia.  Musculoskeletal: positive for fall and pain in left ankle, no gait instability  Neurological: Negative for dizziness, tremors, seizures, syncope, facial asymmetry, speech difficulty, weakness, light-headedness, numbness and headaches.  Hematological: Negative for adenopathy. Does not bruise/bleed easily.  Psychiatric/Behavioral: Negative for hallucinations, behavioral problems, confusion, dysphoric mood, decreased concentration and agitation.   Past Medical History:  Diagnosis Date  . Anemia   . Atrial flutter ([place])    a. s/p RFCA of counterclockwise cavo-tricuspid isthmus dependent atrial flutter 2009 by Dr. Rayann Heman.  Marland Kitchen CAD (coronary artery disease) 06/2014   a. 06/2014: abnl nuc. Cath: Totally occluded LAD with faint collaterals (not a candidate for CTO PCI), 90% ramus s/p DES, 50-70% OM2.  . Chronic diastolic CHF (congestive heart failure) ([place])    a. 2D Echo 10/23/13: EF 50-55%, basal mid-inf HK, grade 2 DD, mild MR, mod dilated LA, no sig change from prior.  . CKD (chronic kidney disease), stage III   . Complication of anesthesia    " during my kidney stone surgery my heart went out of rhythm  . Compression fracture of L1 lumbar vertebra ([name])   . Diabetes ([place])    TYPE 2   . Dyslipidemia   . Dyspnea   . Frequent PVCs   . GERD (gastroesophageal reflux disease)   . History of blood transfusion   .  History of peptic ulcer disease   . HTN (hypertension)   . Hx of acute renal failure 01/02/10-3/24-11   due to hypovolemic shock, gastroenteritis and dehydration,hospitalized . Did requre a few days of dialysis. Cr at discharge was 1.8    . Kidney disease   . Kidney stone may 2009   Right hydronephrosis, S/P stone removal  . Lower back pain   . Macular degeneration   . OA (osteoarthritis) of hip   . Obesity   . Osteopenia   . PAF (paroxysmal atrial fibrillation) ([name])    not on long term anticoagulation due to history of heme positive stools and anemia  . Shingles    episode  . Small bowel obstruction, partial ([place]) 2009   Episode  . Ulcerative colitis ([place])    a. s/p total colectomy remotely.    Past Surgical History:  Procedure Laterality Date  . BACK SURGERY    . CARDIAC CATHETERIZATION  06/19/2014  . COLON RESECTION    . CORONARY ANGIOPLASTY    . CORONARY STENT PLACEMENT  06/19/2014   DES       dr Martinique  . HERNIA REPAIR    . LEFT AND RIGHT HEART CATHETERIZATION WITH CORONARY ANGIOGRAM N/A 06/19/2014   Procedure: LEFT AND RIGHT HEART CATHETERIZATION WITH CORONARY ANGIOGRAM;  Surgeon: Peter M Martinique, MD;  Location: [facility] CATH LAB;  Service: Cardiovascular;  Laterality: N/A;  . TONSILLECTOMY     Social Hx:  reports that  has never smoked. he has never used smokeless tobacco. He reports that he does not drink alcohol or use drugs.  Allergies  Allergen Reactions  . Brilinta [Ticagrelor] Shortness Of Breath    Changed to Plavix due to SOB  . Penicillins Rash    Has patient had a PCN reaction causing immediate rash, facial/tongue/throat swelling, SOB or lightheadedness with hypotension:Yes Has patient had a PCN reaction causing severe rash involving mucus membranes or skin necrosis: No Has patient had a PCN reaction that required hospitalization: No Has patient had a PCN reaction occurring within the last 10 years: No If all of the above answers are "NO", then may proceed with Cephalosporin use.  . Sulfa Antibiotics     Pt does not remember the reaction, but it sure the allergy exists.    Family History  Problem Relation Age of Onset  . Colon cancer Mother     Medication Sig  amiodarone (PACERONE) 200  MG tablet Take 1 tablet (200 mg total) by mouth daily.  aspirin 81 MG tablet Take 81 mg by mouth daily.  ferrous sulfate 325 (65 FE) MG tablet Take 1 tablet (325 mg total) by mouth 2 (two) times daily with a meal.  gabapentin (NEURONTIN) 100 MG capsule Take 1 capsule (100 mg total) by mouth 2 (two) times daily. Patient not taking: Reported on 06/29/2017  omeprazole (PRILOSEC) 20 MG capsule TAKE 1 CAPSULE (20 MG TOTAL) BY MOUTH DAILY.  pioglitazone (ACTOS) 30 MG tablet Take 1 tablet (30 mg total) by mouth daily.  saxagliptin HCl (ONGLYZA) 5 MG TABS tablet Take 1 tablet (5 mg total) by mouth daily.  simvastatin (ZOCOR) 20 MG tablet Take 20 mg by mouth daily.  tamsulosin (FLOMAX) 0.4 MG CAPS capsule Take 1 capsule (0.4 mg total) by mouth daily after supper.   Physical Exam: Vitals:   10/24/17 1515 10/24/17 1520 10/24/17 1530 10/24/17 1548  BP: (!) 144/69 134/70 (!) 148/66 140/62  Pulse: 63 67 62 71  Resp: 14 15 16 13   Temp:  TempSrc:      SpO2: 100% 100% 100% 100%  Weight:        Constitutional: in mild distress due to pain  Vitals:   10/24/17 1515 10/24/17 1520 10/24/17 1530 10/24/17 1548  BP: (!) 144/69 134/70 (!) 148/66 140/62  Pulse: 63 67 62 71  Resp: 14 15 16 13   Temp:      TempSrc:      SpO2: 100% 100% 100% 100%  Weight:       Eyes: PERRL, lids and conjunctivae normal ENMT: Mucous membranes are moist. Posterior pharynx clear of any exudate or lesions.Normal dentition.  Neck: normal, supple, no masses, no thyromegaly Respiratory: clear to auscultation bilaterally, no wheezing, no crackles. Normal respiratory effort. No accessory muscle use.  Cardiovascular: rate controlled, appreciate S1, S2 Abdomen: no tenderness, no masses palpated. Ostomy bag Musculoskeletal: left leg pain, unable ot fully assess ROM due to pain and fracture in left ankle  Skin: no rashes, lesions, ulcers. No induration Neurologic: CN 2-12 grossly intact. Sensation intact, DTR normal. Strength 5/5  in all 4.  Psychiatric: Normal judgment and insight. Alert and oriented x 3. Normal mood.   Labs on Admission: I have personally reviewed following labs and imaging studies  CBC: Recent Labs  Lab 10/24/17 1101 10/24/17 1321  WBC 5.8  --   NEUTROABS 4.5  --   HGB 11.2* 11.6*  HCT 35.2* 34.0*  MCV 99.2  --   PLT 215  --    Basic Metabolic Panel: Recent Labs  Lab 10/24/17 1321  NA 138  K 4.8  CL 104  GLUCOSE 119*  BUN 33*  CREATININE 1.30*   Urine analysis:    Component Value Date/Time   COLORURINE STRAW (A) 01/04/2017 1229   APPEARANCEUR CLEAR 01/04/2017 1229   LABSPEC 1.012 01/04/2017 1229   PHURINE 5.0 01/04/2017 1229   GLUCOSEU NEGATIVE 01/04/2017 1229   HGBUR NEGATIVE 01/04/2017 1229   BILIRUBINUR NEGATIVE 01/04/2017 1229   KETONESUR NEGATIVE 01/04/2017 1229   PROTEINUR NEGATIVE 01/04/2017 1229   UROBILINOGEN 0.2 10/22/2014 1052   NITRITE NEGATIVE 01/04/2017 1229   LEUKOCYTESUR NEGATIVE 01/04/2017 1229   Radiological Exams on Admission: Dg Lumbar Spine Complete Result Date: 10/24/2017 Osteopenia without acute bony abnormality.   Dg Ankle Complete Left Result Date: 10/24/2017 Continued improvement of alignment of trimalleolar fracture.   Dg Ankle Complete Left Result Date: 10/24/2017 Improved position and alignment of the trimalleolar fractures. Persistent moderate displacement of the distal fibular fracture.   Dg Ankle Complete Left Result Date: 10/24/2017 Trimalleolar fracture dislocation of the ankle.   EKG: Not yet done   Assessment/Plan  Principal Problem:   Fall / Malleolar fracture, left, closed, initial encounter / Distal fibular fracture - films with trimalleolar fracture dislocation of the ankle - ankle reduced in ED - pt tolerated procedure well  - Orthopedic surgery to see in consultation  - ortho requested for pt to be transferred to [facility], family in agreement - keep NPO after midnight - ortho to see in AM   Active Problems:   CAD in  native artery- total LAD, s/p RI DES 06/19/14 - Continue aspirin - No chest pain    Dyslipidemia associated with type 2 DM - Continue Zocor     CKD (chronic kidney disease), stage III ([place]) - Baseline Cr in 2017 is around 2 - Cr is within baseline range on this admission    Anemia of CKD / Iron deficiency anemia - Continue ferrous sulfate supplementation  Chronic combined systolic and diastolic CHF (congestive heart failure) ([name]) - ECHO in 01/2016 showed EF of 40-45% and grade 1 DD - Compensated  - monitor daily weights, strict I/O    Paroxysmal atrial fibrillation ([name]) - CHADS vasc score at least 5 - On amiodarone for rate control - Continue aspirin  - no full AC due to history of GI bleed     Diabetes mellitus with diabetic nephropathy without long-term current use of insulin ([name]) - A1c in 12/2016 - 6.5 - Resume Actos and Onglyza - add SSI     BPH - Continue Flomax    Obesity  - Body mass index is 35.88 kg/m.  DVT prophylaxis: SCD's, aspirin  Code Status: full code  Family Communication: Pt updated at bedside, sister also at bedside  Disposition Plan: admission to telemetry, will transfer to [facility]  Consults called: Orthopedic surgery  Admission status: inpatient, patient admitted for fall and has left ankle fracture, needs to be seen by ortho and evaluated for possible surgery. He will require postoperative PT evaluation and at least 3 days on inpatient service for his acute ankle fracture.   Faye Ramsay MD [facility] Pager [phone]  If 7PM-7AM, please contact night-coverage [email or web address] Password [facility]  10/24/2017, 3:49 PM

## 2017-10-24 NOTE — ED Provider Notes (Signed)
[facility] DEPT Provider Note   CSN: [id number] Arrival date & time: 10/24/17  7829     History   Chief Complaint Chief Complaint  Patient presents with  . Ankle Pain    HPI [name] [name] is a 84 y.o. male.  HPI  Patient with history of coronary artery disease, CHF, CKD comes in with chief complaint of fall.  Patient reports that he was trying to get into his car, when his left ankle twisted, and gave away.  Patient had severe pain right away and has been unable to walk.  Patient has history of diabetic neuropathy, and denies any new sensory changes to the leg.  Patient is not on any blood thinners.  Patient resides by himself, and has poor balance that has led to several falls over the past few weeks.  Patient is absolutely sure that he did not strike his head, and he denies any headaches, vision changes, dizziness, numbness, tingling.  Past Medical History:  Diagnosis Date  . Anemia   . Atrial flutter ([place])    a. s/p RFCA of counterclockwise cavo-tricuspid isthmus dependent atrial flutter 2009 by Dr. Rayann Heman.  Marland Kitchen CAD (coronary artery disease) 06/2014   a. 06/2014: abnl nuc. Cath: Totally occluded LAD with faint collaterals (not a candidate for CTO PCI), 90% ramus s/p DES, 50-70% OM2.  . Chronic diastolic CHF (congestive heart failure) ([place])    a. 2D Echo 10/23/13: EF 50-55%, basal mid-inf HK, grade 2 DD, mild MR, mod dilated LA, no sig change from prior.  . CKD (chronic kidney disease), stage III ([place])   . Complication of anesthesia    " during my kidney stone surgery my heart went out of rhythm  . Compression fracture of L1 lumbar vertebra ([name])   . Diabetes ([place])    TYPE 2   . Dyslipidemia   . Dyspnea   . Frequent PVCs   . GERD (gastroesophageal reflux disease)   . History of blood transfusion   . History of peptic ulcer disease   . HTN (hypertension)   . Hx of acute renal failure 01/02/10-3/24-11   due to hypovolemic shock, gastroenteritis  and dehydration,hospitalized . Did requre a few days of dialysis. Cr at discharge was 1.8  . Kidney disease   . Kidney stone may 2009   Right hydronephrosis, S/P stone removal  . Lower back pain   . Macular degeneration   . OA (osteoarthritis) of hip   . Obesity   . Osteopenia   . PAF (paroxysmal atrial fibrillation) ([name])    not on long term anticoagulation due to history of heme positive stools and anemia  . Shingles    episode  . Small bowel obstruction, partial ([place]) 2009   Episode  . Ulcerative colitis ([place])    a. s/p total colectomy remotely.    Patient Active Problem List   Diagnosis Date Noted  . Fall 10/24/2017  . Malleolar fracture, left, closed, initial encounter 10/24/2017  . Fibula fracture 10/24/2017  . Diabetes mellitus with diabetic nephropathy without long-term current use of insulin ([place]) 01/05/2017  . CKD (chronic kidney disease), stage III ([place])   . Chronic combined systolic and diastolic CHF (congestive heart failure) ([place])   . Paroxysmal atrial fibrillation ([name])   . Bleeding gastrointestinal   . Dyslipidemia 10/14/2014  . CAD in native artery- total LAD, s/p RI DES 06/19/14 07/11/2014  . Ulcerative colitis [facility])     Past Surgical History:  Procedure Laterality Date  .  BACK SURGERY    . CARDIAC CATHETERIZATION  06/19/2014  . COLON RESECTION    . CORONARY ANGIOPLASTY    . CORONARY STENT PLACEMENT  06/19/2014   DES       dr Martinique  . HERNIA REPAIR    . LEFT AND RIGHT HEART CATHETERIZATION WITH CORONARY ANGIOGRAM N/A 06/19/2014   Procedure: LEFT AND RIGHT HEART CATHETERIZATION WITH CORONARY ANGIOGRAM;  Surgeon: Peter M Martinique, MD;  Location: [facility] CATH LAB;  Service: Cardiovascular;  Laterality: N/A;  . TONSILLECTOMY         Home Medications    Prior to Admission medications   Medication Sig Start Date End Date Taking? Authorizing Provider  acetaminophen (TYLENOL) 500 MG tablet Take 1 tablet (500 mg total) by mouth every 6 (six) hours as needed (for  pain or headaches). Available over the counter 01/08/17  Yes Hongalgi, Lenis Dickinson, MD  amiodarone (PACERONE) 200 MG tablet Take 1 tablet (200 mg total) by mouth daily. 07/03/17  Yes Sueanne Margarita, MD  aspirin 81 MG tablet Take 81 mg by mouth daily.   Yes [provider]  calcium carbonate (OS-CAL) 600 MG TABS tablet Take 600 mg by mouth daily with breakfast.   Yes [provider]  cholecalciferol (VITAMIN D) 1000 units tablet Take 3,000 Units by mouth daily.   Yes [provider]  doxycycline (VIBRA-TABS) 100 MG tablet Take 100 mg by mouth 2 (two) times daily. 10/23/17  Yes [provider]  ferrous sulfate 325 (65 FE) MG tablet Take 1 tablet (325 mg total) by mouth 2 (two) times daily with a meal. 10/27/14  Yes Florencia Reasons, MD  omeprazole (PRILOSEC) 20 MG capsule TAKE 1 CAPSULE (20 MG TOTAL) BY MOUTH DAILY. Patient taking differently: Take 20 mg by mouth at bedtime. TAKE 1 CAPSULE (20 MG TOTAL) BY MOUTH DAILY. 12/01/15  Yes Irene Shipper, MD  pioglitazone (ACTOS) 30 MG tablet Take 1 tablet (30 mg total) by mouth daily. 11/05/14  Yes Dunn, Dayna N, PA-C  ranitidine (ZANTAC) 150 MG capsule Take 150 mg by mouth daily.   Yes [provider]  saxagliptin HCl (ONGLYZA) 5 MG TABS tablet Take 1 tablet (5 mg total) by mouth daily. 06/20/14  Yes Tanda Rockers, MD  simvastatin (ZOCOR) 20 MG tablet Take 20 mg by mouth daily.   Yes [provider]  tamsulosin (FLOMAX) 0.4 MG CAPS capsule Take 1 capsule (0.4 mg total) by mouth daily after supper. 10/27/14  Yes Florencia Reasons, MD  traMADol (ULTRAM) 50 MG tablet Take 50 mg by mouth every 6 (six) hours as needed for pain. 10/23/17  Yes [provider]  gabapentin (NEURONTIN) 100 MG capsule Take 1 capsule (100 mg total) by mouth 2 (two) times daily. Patient not taking: Reported on 06/29/2017 01/07/17   Modena Jansky, MD    Family History Family History  Problem Relation Age of Onset  . Colon cancer Mother      Social History Social History   Tobacco Use  . Smoking status: Never Smoker  . Smokeless tobacco: Never Used  Substance Use Topics  . Alcohol use: No  . Drug use: No     Allergies   Brilinta [ticagrelor]; Penicillins; and Sulfa antibiotics   Review of Systems Review of Systems  All other systems reviewed and are negative.    Physical Exam Updated Vital Signs BP (!) 144/64   Pulse 66   Temp 97.7 F (36.5 C) (Oral)   Resp 12  Wt 107 kg (236 lb)   SpO2 100%   BMI 35.88 kg/m   Physical Exam  Constitutional: He is oriented to person, place, and time. He appears well-developed.  HENT:  Head: Atraumatic.  Neck: Neck supple.  Cardiovascular: Normal rate.  Pulmonary/Chest: Effort normal.  Musculoskeletal: He exhibits tenderness and deformity.  Left ankle is grossly deformed.  Skin is warm to touch.  Sensory exam is limited due to known history of neuropathy in the same extremity.  Patient has 2+ dorsalis pedis  Neurological: He is alert and oriented to person, place, and time.  Skin: Skin is warm.  Nursing note and vitals reviewed.    ED Treatments / Results  Labs (all labs ordered are listed, but only abnormal results are displayed) Labs Reviewed  CBC WITH DIFFERENTIAL/PLATELET - Abnormal; Notable for the following components:      Result Value   RBC 3.55 (*)    Hemoglobin 11.2 (*)    HCT 35.2 (*)    All other components within normal limits  I-STAT CHEM 8, ED - Abnormal; Notable for the following components:   BUN 33 (*)    Creatinine, Ser 1.30 (*)    Glucose, Bld 119 (*)    Hemoglobin 11.6 (*)    HCT 34.0 (*)    All other components within normal limits    EKG  EKG Interpretation None       Radiology Dg Lumbar Spine Complete  Result Date: 10/24/2017 CLINICAL DATA:  Fall. EXAM: LUMBAR SPINE - COMPLETE 4+ VIEW COMPARISON:  CT scan 10/22/2014. FINDINGS: Bones are diffusely demineralized no evidence for an acute fracture in the lumbar spine or  sacrum. Patient is status post vertebral augmentation at L1. Atherosclerotic calcification noted in the distal abdominal aorta. Stoma noted over the right abdomen. Surgical clips are evident in the midline pelvis and left abdomen. IMPRESSION: Osteopenia without acute bony abnormality. Electronically Signed   By: Misty Stanley M.D.   On: 10/24/2017 12:37   Dg Ankle Complete Left  Result Date: 10/24/2017 CLINICAL DATA:  Left ankle fracture.  Postreduction. EXAM: LEFT ANKLE COMPLETE - 3+ VIEW COMPARISON:  10/24/2017. FINDINGS: Continued improved alignment of trimalleolar fracture. Slight displaced comminuted fracture fragments again noted. IMPRESSION: Continued improvement of alignment of trimalleolar fracture. Electronically Signed   By: Marcello Moores  Register   On: 10/24/2017 15:46   Dg Ankle Complete Left  Result Date: 10/24/2017 CLINICAL DATA:  Postreduction films of the left ankle. EXAM: LEFT ANKLE COMPLETE - 3+ VIEW COMPARISON:  Earlier films, same date. FINDINGS: Much improved position and alignment of the trimalleolar ankle fracture in a plaster splint. The tibiotalar joint is maintained. Mild lateral ankle mortise widening and persistent moderate displacement of the distal fibular fracture at and above the level of the ankle mortise. IMPRESSION: Improved position and alignment of the trimalleolar fractures. Persistent moderate displacement of the distal fibular fracture. Electronically Signed   By: Marijo Sanes M.D.   On: 10/24/2017 15:22   Dg Ankle Complete Left  Result Date: 10/24/2017 CLINICAL DATA:  Fall EXAM: LEFT ANKLE COMPLETE - 3+ VIEW COMPARISON:  None. FINDINGS: Fracture dislocation of the ankle. Trimalleolar fracture with lateral dislocation of the talus. Mild degenerative change in the midfoot IMPRESSION: Trimalleolar fracture dislocation of the ankle. Electronically Signed   By: Franchot Gallo M.D.   On: 10/24/2017 12:35    Procedures .Sedation Date/Time: 10/24/2017 5:48 PM Performed by:  Varney Biles, MD Authorized by: Varney Biles, MD   Consent:    Consent  obtained:  Written   Consent given by:  Patient   Risks discussed:  Allergic reaction, dysrhythmia, inadequate sedation, nausea, vomiting, respiratory compromise necessitating ventilatory assistance and intubation and prolonged sedation necessitating reversal Universal protocol:    Procedure explained and questions answered to patient or proxy's satisfaction: yes     Relevant documents present and verified: yes     Test results available and properly labeled: yes     Imaging studies available: yes     Required blood products, implants, devices, and special equipment available: yes     Site/side marked: yes     Immediately prior to procedure a time out was called: yes     Patient identity confirmation method:  Arm band Indications:    Procedure performed:  Fracture reduction   Procedure necessitating sedation performed by:  Physician performing sedation   Intended level of sedation:  Moderate (conscious sedation) Pre-sedation assessment:    Time since last food or drink:  More than 6 hours ago   ASA classification: class 3 - patient with severe systemic disease     Neck mobility: normal     Mouth opening:  3 or more finger widths   Mallampati score:  II - soft palate, uvula, fauces visible   Pre-sedation assessments completed and reviewed: airway patency, cardiovascular function, hydration status, mental status, nausea/vomiting, pain level and respiratory function     Pre-sedation assessment completed:  10/24/2017 2:49 PM Immediate pre-procedure details:    Reassessment: Patient reassessed immediately prior to procedure     Reviewed: vital signs, relevant labs/tests and NPO status     Verified: bag valve mask available, emergency equipment available, intubation equipment available, IV patency confirmed, oxygen available and suction available   Procedure details (see MAR for exact dosages):    Preoxygenation:   Nasal cannula   Sedation:  Etomidate   Analgesia:  Fentanyl   Intra-procedure monitoring:  Blood pressure monitoring, cardiac monitor, continuous capnometry, continuous pulse oximetry, frequent LOC assessments and frequent vital sign checks   Intra-procedure events: none     Total Provider sedation time (minutes):  45 Post-procedure details:    Post-sedation assessment completed:  10/24/2017 4:50 PM   Attendance: Constant attendance by certified staff until patient recovered     Recovery: Patient returned to pre-procedure baseline     Post-sedation assessments completed and reviewed: airway patency, cardiovascular function, hydration status, mental status, nausea/vomiting, pain level and respiratory function     Patient is stable for discharge or admission: yes     Patient tolerance:  Tolerated well, no immediate complications Comments:     In total patient required 12 mg of etomidate and 75 mcg of fentanyl Reduction of fracture Date/Time: 10/24/2017 5:55 PM Performed by: Varney Biles, MD Authorized by: Varney Biles, MD  Consent: Written consent obtained. Risks and benefits: risks, benefits and alternatives were discussed Consent given by: patient Patient understanding: patient states understanding of the procedure being performed Patient consent: the patient's understanding of the procedure matches consent given Procedure consent: procedure consent matches procedure scheduled Relevant documents: relevant documents present and verified Test results: test results available and properly labeled Site marked: the operative site was marked Imaging studies: imaging studies available Required items: required blood products, implants, devices, and special equipment available Patient identity confirmed: arm band Time out: Immediately prior to procedure a "time out" was called to verify the correct patient, procedure, equipment, support staff and site/side marked as required. Preparation:  Patient was prepped and draped in the usual  sterile fashion. Local anesthesia used: no  Anesthesia: Local anesthesia used: no  Sedation: Patient sedated: yes Sedation start date/time: 10/24/2017 3:56 PM Sedation end date/time: 10/24/2017 4:36 PM Vitals: Vital signs were monitored during sedation.  Patient tolerance: Patient tolerated the procedure well with no immediate complications     SPLINT APPLICATION Date/Time: 2:44 PM Authorized by: Jemar Paulsen Consent: Verbal consent obtained. Risks and benefits: risks, benefits and alternatives were discussed Consent given by: patient Splint applied by: orthopedic technician Location details: left ankle Splint type: leg splint Supplies used: plaster, ace wrap, kerlex, webril Post-procedure: The splinted body part was neurovascularly unchanged following the procedure. Patient tolerance: Patient tolerated the procedure well with no immediate complications.     (including critical care time)  Medications Ordered in ED Medications  ketamine 100 mg in normal saline 10 mL (57m/mL) syringe (32 mg Intravenous Not Given 10/24/17 1548)  etomidate (AMIDATE) injection 8 mg (8 mg Intravenous See Procedure Record 10/24/17 1548)  fentaNYL (SUBLIMAZE) injection 100 mcg (100 mcg Intravenous See Procedure Record 10/24/17 1547)  HYDROmorphone (DILAUDID) injection 1 mg (1 mg Intravenous Given 10/24/17 1720)  gabapentin (NEURONTIN) capsule 100 mg (not administered)  HYDROmorphone (DILAUDID) injection 1 mg (1 mg Intramuscular Given 10/24/17 1105)  fentaNYL (SUBLIMAZE) injection (25 mcg Intravenous Given 10/24/17 1501)  etomidate (AMIDATE) injection (8 mg Intravenous Given 10/24/17 1459)  etomidate (AMIDATE) injection (3 mg Intravenous Given 10/24/17 1517)  fentaNYL (SUBLIMAZE) injection (25 mcg Intravenous Given 10/24/17 1528)     Initial Impression / Assessment and Plan / ED Course  I have reviewed the triage vital signs and the nursing notes.  Pertinent labs &  imaging results that were available during my care of the patient were reviewed by me and considered in my medical decision making (see chart for details).  Clinical Course as of Jan 08 1Reather ConverseJan 08, 2019  1328 [facility] will follow patient.  According to Dr. BNinfa Linden Dr. [facility]  N.p.o. after midnight. Hospitalist made aware.  [AN]    Clinical Course User Index [AN] NVarney Biles MD   Patient comes in with chief complaint of fall.  As a result of the mechanical fall patient has a dry mouth fracture of the left ankle.  Ankle has been reduced, and patient tolerated the procedure well.  Patient has neuropathy, therefore his sensory exam was limited however it appears that patient is neurovascularly intact.  Final Clinical Impressions(s) / ED Diagnoses   Final diagnoses:  Trimalleolar fracture of ankle, closed, left, initial encounter    ED Discharge Orders    None       NVarney Biles MD 10/24/17 1758

## 2017-10-24 NOTE — ED Notes (Signed)
Pt's family members would like to be notified once patient is transferred to [facility].

## 2017-10-24 NOTE — ED Triage Notes (Signed)
Pt was attempting to climb into his truck when he fell injuring left ankle. Obvious deformity. Pain 8/10. Splint intact (EMS placed).

## 2017-10-24 NOTE — ED Notes (Signed)
Per MD hold labs for now until decision has been made to admit.

## 2017-10-24 NOTE — Clinical Social Work Note (Signed)
Clinical Social Work Assessment  Patient Details  Name: [name] [name] MRN: [id number] Date of Birth: [birth year]  Date of referral:  10/24/17               Reason for consult:  Facility Placement, Discharge Planning                Permission sought to share information with:    Permission granted to share information::     Name::        Agency::     Relationship::     Contact Information:     Housing/Transportation Living arrangements for the past 2 months:  Single Family Home Source of Information:  Patient Patient Interpreter Needed:  None Criminal Activity/Legal Involvement Pertinent to Current Situation/Hospitalization:    Significant Relationships:  Siblings Lives with:  Self Do you feel safe going back to the place where you live?  Yes Need for family participation in patient care:  Yes (Comment)  Care giving concerns:  Patient arrives to ED after fall and fractured ankle.    Social Worker assessment / plan:  CSW met with patient via bedside to discuss potential discharge plans. Patient is currently in ED and will potentially be admitted. Patient currently lives at home alone and has difficultly taking care of himself- patient states he uses a walker to get around however does have frequent falls. Patient has Medicare and CSW explained the need for 3 night stay for insurance to pay for rehab. Patient stated he is unable to pay privately for rehab at this time. If admitted, patient will need PT evaluation to determine if SNF is appropriate.   Employment status:  Retired Forensic scientist:  Medicare PT Recommendations:  Not assessed at this time Information / Referral to community resources:     Patient/Family's Response to care:  Patient appreciated CSW.   Patient/Family's Understanding of and Emotional Response to Diagnosis, Current Treatment, and Prognosis:  Unknown at this time.  Emotional Assessment Appearance:  Appears stated age Attitude/Demeanor/Rapport:     Affect (typically observed):  Accepting, Pleasant, Calm Orientation:  Oriented to Self, Oriented to Place, Oriented to  Time, Oriented to Situation Alcohol / Substance use:    Psych involvement (Current and /or in the community):  No (Comment)  Discharge Needs  Concerns to be addressed:  No discharge needs identified Readmission within the last 30 days:  No Current discharge risk:  None Barriers to Discharge:  No Barriers Identified   Weston Anna, LCSW 10/24/2017, 2:58 PM

## 2017-10-25 ENCOUNTER — Inpatient Hospital Stay (HOSPITAL_COMMUNITY): Payer: Medicare Other | Admitting: Certified Registered Nurse Anesthetist

## 2017-10-25 ENCOUNTER — Encounter (HOSPITAL_COMMUNITY): Payer: Self-pay | Admitting: *Deleted

## 2017-10-25 ENCOUNTER — Encounter (HOSPITAL_COMMUNITY)
Admission: EM | Disposition: A | Discharge status: Skilled Nursing Facility | Payer: Self-pay | Source: Home / Self Care | Attending: Internal Medicine

## 2017-10-25 DIAGNOSIS — S82852A Displaced trimalleolar fracture of left lower leg, initial encounter for closed fracture: Principal | ICD-10-CM

## 2017-10-25 DIAGNOSIS — W19XXXA Unspecified fall, initial encounter: Secondary | ICD-10-CM

## 2017-10-25 DIAGNOSIS — E1121 Type 2 diabetes mellitus with diabetic nephropathy: Secondary | ICD-10-CM

## 2017-10-25 DIAGNOSIS — S82852S Displaced trimalleolar fracture of left lower leg, sequela: Secondary | ICD-10-CM

## 2017-10-25 HISTORY — PX: ORIF ANKLE FRACTURE: SHX5408

## 2017-10-25 LAB — COMPREHENSIVE METABOLIC PANEL
ALT: 13 U/L — ABNORMAL LOW (ref 17–63)
AST: 18 U/L (ref 15–41)
Albumin: 2.4 g/dL — ABNORMAL LOW (ref 3.5–5.0)
Alkaline Phosphatase: 94 U/L (ref 38–126)
Anion gap: 4 — ABNORMAL LOW (ref 5–15)
BUN: 27 mg/dL — ABNORMAL HIGH (ref 6–20)
CO2: 25 mmol/L (ref 22–32)
Calcium: 8.2 mg/dL — ABNORMAL LOW (ref 8.9–10.3)
Chloride: 108 mmol/L (ref 101–111)
Creatinine, Ser: 1.26 mg/dL — ABNORMAL HIGH (ref 0.61–1.24)
GFR calc Af Amer: 60 mL/min — ABNORMAL LOW (ref >60)
GFR calc non Af Amer: 51 mL/min — ABNORMAL LOW (ref >60)
Glucose, Bld: 118 mg/dL — ABNORMAL HIGH (ref 65–99)
Potassium: 4.8 mmol/L (ref 3.5–5.1)
Sodium: 137 mmol/L (ref 135–145)
Total Bilirubin: 0.9 mg/dL (ref 0.3–1.2)
Total Protein: 5.5 g/dL — ABNORMAL LOW (ref 6.5–8.1)

## 2017-10-25 LAB — CBG MONITORING, ED: Glucose-Capillary: 120 mg/dL — ABNORMAL HIGH (ref 65–99)

## 2017-10-25 LAB — GLUCOSE, CAPILLARY
Glucose-Capillary: 109 mg/dL — ABNORMAL HIGH (ref 65–99)
Glucose-Capillary: 151 mg/dL — ABNORMAL HIGH (ref 65–99)
Glucose-Capillary: 209 mg/dL — ABNORMAL HIGH (ref 65–99)
Glucose-Capillary: 144 mg/dL — ABNORMAL HIGH (ref 65–99)

## 2017-10-25 LAB — CBC
HCT: 32.2 % — ABNORMAL LOW (ref 39.0–52.0)
Hemoglobin: 10.3 g/dL — ABNORMAL LOW (ref 13.0–17.0)
MCH: 32 pg (ref 26.0–34.0)
MCHC: 32 g/dL (ref 30.0–36.0)
MCV: 100 fL (ref 78.0–100.0)
Platelets: 195 10*3/uL (ref 150–400)
RBC: 3.22 MIL/uL — ABNORMAL LOW (ref 4.22–5.81)
RDW: 14.7 % (ref 11.5–15.5)
WBC: 7.3 10*3/uL (ref 4.0–10.5)

## 2017-10-25 SURGERY — OPEN REDUCTION INTERNAL FIXATION (ORIF) ANKLE FRACTURE
Anesthesia: Regional | Site: Leg Lower | Laterality: Left

## 2017-10-25 MED ORDER — ONDANSETRON HCL 4 MG/2ML IJ SOLN
4.0000 mg | Freq: Four times a day (QID) | INTRAMUSCULAR | Status: DC | PRN
Start: 2017-10-25 — End: 2017-10-25

## 2017-10-25 MED ORDER — METOCLOPRAMIDE HCL 5 MG/ML IJ SOLN: 5.0000 mg | Freq: Three times a day (TID) | INTRAMUSCULAR | Status: DC | PRN

## 2017-10-25 MED ORDER — SIMVASTATIN 20 MG PO TABS
20.0000 mg | ORAL_TABLET | Freq: Every day | ORAL | Status: DC
  Administered 2017-10-26 – 2017-10-27 (×2): 20 mg via ORAL
  Filled 2017-10-25 (×3): qty 1

## 2017-10-25 MED ORDER — FERROUS SULFATE 325 (65 FE) MG PO TABS
325.0000 mg | ORAL_TABLET | Freq: Two times a day (BID) | ORAL | Status: DC
  Administered 2017-10-25 – 2017-10-27 (×4): 325 mg via ORAL
  Filled 2017-10-25 (×5): qty 1

## 2017-10-25 MED ORDER — CEFAZOLIN SODIUM-DEXTROSE 2-4 GM/100ML-% IV SOLN
2.0000 g | Freq: Four times a day (QID) | INTRAVENOUS | Status: AC
  Administered 2017-10-25 – 2017-10-26 (×3): 2 g via INTRAVENOUS
  Filled 2017-10-25 (×4): qty 100

## 2017-10-25 MED ORDER — ONDANSETRON HCL 4 MG/2ML IJ SOLN
INTRAMUSCULAR | Status: DC | PRN
  Administered 2017-10-25: 4 mg via INTRAVENOUS

## 2017-10-25 MED ORDER — FENTANYL CITRATE (PF) 100 MCG/2ML IJ SOLN
INTRAMUSCULAR | Status: AC
  Administered 2017-10-25: 50 ug
  Filled 2017-10-25: qty 2

## 2017-10-25 MED ORDER — ASPIRIN EC 81 MG PO TBEC
81.0000 mg | DELAYED_RELEASE_TABLET | Freq: Every day | ORAL | Status: DC
  Administered 2017-10-26 – 2017-10-27 (×2): 81 mg via ORAL
  Filled 2017-10-25 (×3): qty 1

## 2017-10-25 MED ORDER — LINAGLIPTIN 5 MG PO TABS
5.0000 mg | ORAL_TABLET | Freq: Every day | ORAL | Status: DC
  Administered 2017-10-26 – 2017-10-27 (×2): 5 mg via ORAL
  Filled 2017-10-25 (×3): qty 1

## 2017-10-25 MED ORDER — BISACODYL 10 MG RE SUPP
10.0000 mg | Freq: Every day | RECTAL | Status: DC | PRN
Start: 2017-10-25 — End: 2017-10-27

## 2017-10-25 MED ORDER — FENTANYL CITRATE (PF) 100 MCG/2ML IJ SOLN: 50.0000 ug | Freq: Once | INTRAMUSCULAR | Status: DC

## 2017-10-25 MED ORDER — ONDANSETRON HCL 4 MG/2ML IJ SOLN: 4.0000 mg | Freq: Four times a day (QID) | INTRAMUSCULAR | Status: DC | PRN

## 2017-10-25 MED ORDER — MAGNESIUM CITRATE PO SOLN: 1.0000 | Freq: Once | ORAL | Status: DC | PRN

## 2017-10-25 MED ORDER — FENTANYL CITRATE (PF) 250 MCG/5ML IJ SOLN
INTRAMUSCULAR | Status: AC
  Filled 2017-10-25: qty 5

## 2017-10-25 MED ORDER — ACETAMINOPHEN 325 MG PO TABS
650.0000 mg | ORAL_TABLET | ORAL | Status: DC | PRN
  Administered 2017-10-26 – 2017-10-27 (×4): 650 mg via ORAL
  Filled 2017-10-25 (×5): qty 2

## 2017-10-25 MED ORDER — PIOGLITAZONE HCL 30 MG PO TABS
30.0000 mg | ORAL_TABLET | Freq: Every day | ORAL | Status: DC
  Administered 2017-10-27: 30 mg via ORAL
  Filled 2017-10-25 (×2): qty 1

## 2017-10-25 MED ORDER — PROPOFOL 10 MG/ML IV BOLUS
INTRAVENOUS | Status: AC
  Filled 2017-10-25: qty 20

## 2017-10-25 MED ORDER — ONDANSETRON HCL 4 MG PO TABS: 4.0000 mg | ORAL_TABLET | Freq: Four times a day (QID) | ORAL | Status: DC | PRN

## 2017-10-25 MED ORDER — PHENYLEPHRINE HCL 10 MG/ML IJ SOLN
INTRAVENOUS | Status: DC | PRN
  Administered 2017-10-25: 30 ug/min via INTRAVENOUS

## 2017-10-25 MED ORDER — POLYETHYLENE GLYCOL 3350 17 G PO PACK: 17.0000 g | PACK | Freq: Every day | ORAL | Status: DC | PRN

## 2017-10-25 MED ORDER — DOCUSATE SODIUM 100 MG PO CAPS
100.0000 mg | ORAL_CAPSULE | Freq: Two times a day (BID) | ORAL | Status: DC
  Administered 2017-10-25 – 2017-10-27 (×3): 100 mg via ORAL
  Filled 2017-10-25 (×4): qty 1

## 2017-10-25 MED ORDER — LACTATED RINGERS IV SOLN
INTRAVENOUS | Status: DC
  Administered 2017-10-25: 09:00:00 via INTRAVENOUS

## 2017-10-25 MED ORDER — CALCIUM CARBONATE 1250 (500 CA) MG PO TABS
1250.0000 mg | ORAL_TABLET | Freq: Every day | ORAL | Status: DC
  Administered 2017-10-26 – 2017-10-27 (×2): 1250 mg via ORAL
  Filled 2017-10-25 (×3): qty 1

## 2017-10-25 MED ORDER — METHOCARBAMOL 500 MG PO TABS
500.0000 mg | ORAL_TABLET | Freq: Four times a day (QID) | ORAL | Status: DC | PRN
  Administered 2017-10-26 – 2017-10-27 (×4): 500 mg via ORAL
  Filled 2017-10-25 (×4): qty 1

## 2017-10-25 MED ORDER — TAMSULOSIN HCL 0.4 MG PO CAPS
0.4000 mg | ORAL_CAPSULE | Freq: Every day | ORAL | Status: DC
  Administered 2017-10-25 – 2017-10-26 (×2): 0.4 mg via ORAL
  Filled 2017-10-25 (×2): qty 1

## 2017-10-25 MED ORDER — METOCLOPRAMIDE HCL 5 MG PO TABS: 5.0000 mg | ORAL_TABLET | Freq: Three times a day (TID) | ORAL | Status: DC | PRN

## 2017-10-25 MED ORDER — SODIUM CHLORIDE 0.9 % IV SOLN
INTRAVENOUS | Status: DC
  Administered 2017-10-25: 16:00:00 via INTRAVENOUS

## 2017-10-25 MED ORDER — DEXAMETHASONE SODIUM PHOSPHATE 4 MG/ML IJ SOLN
INTRAMUSCULAR | Status: DC | PRN
  Administered 2017-10-25: 5 mg via INTRAVENOUS

## 2017-10-25 MED ORDER — CEFAZOLIN SODIUM-DEXTROSE 2-4 GM/100ML-% IV SOLN
INTRAVENOUS | Status: AC
  Filled 2017-10-25: qty 100

## 2017-10-25 MED ORDER — MIDAZOLAM HCL 2 MG/2ML IJ SOLN: 1.0000 mg | Freq: Once | INTRAMUSCULAR | Status: DC

## 2017-10-25 MED ORDER — AMIODARONE HCL 100 MG PO TABS
200.0000 mg | ORAL_TABLET | Freq: Every day | ORAL | Status: DC
  Administered 2017-10-26 – 2017-10-27 (×2): 200 mg via ORAL
  Filled 2017-10-25: qty 2
  Filled 2017-10-25: qty 1
  Filled 2017-10-25: qty 2

## 2017-10-25 MED ORDER — METHOCARBAMOL 1000 MG/10ML IJ SOLN
500.0000 mg | Freq: Four times a day (QID) | INTRAMUSCULAR | Status: DC | PRN
  Filled 2017-10-25: qty 5

## 2017-10-25 MED ORDER — VITAMIN D 1000 UNITS PO TABS
3000.0000 [IU] | ORAL_TABLET | Freq: Every day | ORAL | Status: DC
  Administered 2017-10-26 – 2017-10-27 (×2): 3000 [IU] via ORAL
  Filled 2017-10-25 (×3): qty 3

## 2017-10-25 MED ORDER — ACETAMINOPHEN 500 MG PO TABS: 500.0000 mg | ORAL_TABLET | Freq: Four times a day (QID) | ORAL | Status: DC | PRN

## 2017-10-25 MED ORDER — CEFAZOLIN SODIUM-DEXTROSE 2-4 GM/100ML-% IV SOLN
2.0000 g | Freq: Once | INTRAVENOUS | Status: AC
  Administered 2017-10-25: 2 g via INTRAVENOUS

## 2017-10-25 MED ORDER — MIDAZOLAM HCL 2 MG/2ML IJ SOLN
INTRAMUSCULAR | Status: AC
  Administered 2017-10-25: 1 mg
  Filled 2017-10-25: qty 2

## 2017-10-25 MED ORDER — PROPOFOL 500 MG/50ML IV EMUL
INTRAVENOUS | Status: DC | PRN
  Administered 2017-10-25: 40 ug/kg/min via INTRAVENOUS

## 2017-10-25 MED ORDER — EPHEDRINE SULFATE-NACL 50-0.9 MG/10ML-% IV SOSY
PREFILLED_SYRINGE | INTRAVENOUS | Status: DC | PRN
  Administered 2017-10-25: 5 mg via INTRAVENOUS

## 2017-10-25 MED ORDER — PHENYLEPHRINE 40 MCG/ML (10ML) SYRINGE FOR IV PUSH (FOR BLOOD PRESSURE SUPPORT)
PREFILLED_SYRINGE | INTRAVENOUS | Status: DC | PRN
  Administered 2017-10-25: 120 ug via INTRAVENOUS
  Administered 2017-10-25: 80 ug via INTRAVENOUS
  Administered 2017-10-25: 120 ug via INTRAVENOUS
  Administered 2017-10-25: 80 ug via INTRAVENOUS

## 2017-10-25 MED ORDER — ACETAMINOPHEN 650 MG RE SUPP: 650.0000 mg | RECTAL | Status: DC | PRN

## 2017-10-25 MED ORDER — 0.9 % SODIUM CHLORIDE (POUR BTL) OPTIME
TOPICAL | Status: DC | PRN
  Administered 2017-10-25: 1000 mL

## 2017-10-25 SURGICAL SUPPLY — 49 items
BANDAGE ESMARK 6X9 LF (GAUZE/BANDAGES/DRESSINGS) IMPLANT
BIT DRILL 2.5X110 QC LCP DISP (BIT) ×2 IMPLANT
BIT DRILL LCP QC 2X140 (BIT) ×2 IMPLANT
BNDG COHESIVE 4X5 TAN STRL (GAUZE/BANDAGES/DRESSINGS) ×2 IMPLANT
BNDG ESMARK 6X9 LF (GAUZE/BANDAGES/DRESSINGS)
BNDG GAUZE ELAST 4 BULKY (GAUZE/BANDAGES/DRESSINGS) ×2 IMPLANT
COVER SURGICAL LIGHT HANDLE (MISCELLANEOUS) ×2 IMPLANT
DRAPE INCISE IOBAN 66X45 STRL (DRAPES) ×2 IMPLANT
DRAPE OEC MINIVIEW 54X84 (DRAPES) IMPLANT
DRAPE U-SHAPE 47X51 STRL (DRAPES) ×2 IMPLANT
DRSG ADAPTIC 3X8 NADH LF (GAUZE/BANDAGES/DRESSINGS) ×2 IMPLANT
DRSG PAD ABDOMINAL 8X10 ST (GAUZE/BANDAGES/DRESSINGS) ×2 IMPLANT
DURAPREP 26ML APPLICATOR (WOUND CARE) ×2 IMPLANT
ELECT REM PT RETURN 9FT ADLT (ELECTROSURGICAL) ×2
ELECTRODE REM PT RTRN 9FT ADLT (ELECTROSURGICAL) ×1 IMPLANT
GAUZE SPONGE 4X4 12PLY STRL (GAUZE/BANDAGES/DRESSINGS) ×2 IMPLANT
GLOVE BIOGEL PI IND STRL 9 (GLOVE) ×1 IMPLANT
GLOVE BIOGEL PI INDICATOR 9 (GLOVE) ×1
GLOVE SURG ORTHO 9.0 STRL STRW (GLOVE) ×2 IMPLANT
GOWN STRL REUS W/ TWL XL LVL3 (GOWN DISPOSABLE) ×3 IMPLANT
GOWN STRL REUS W/TWL XL LVL3 (GOWN DISPOSABLE) ×3
KIT BASIN OR (CUSTOM PROCEDURE TRAY) ×2 IMPLANT
KIT ROOM TURNOVER OR (KITS) ×2 IMPLANT
MANIFOLD NEPTUNE II (INSTRUMENTS) ×2 IMPLANT
NS IRRIG 1000ML POUR BTL (IV SOLUTION) ×2 IMPLANT
PACK ORTHO EXTREMITY (CUSTOM PROCEDURE TRAY) ×2 IMPLANT
PAD ABD 8X10 STRL (GAUZE/BANDAGES/DRESSINGS) ×2 IMPLANT
PAD ARMBOARD 7.5X6 YLW CONV (MISCELLANEOUS) ×4 IMPLANT
PLATE 5H 2.7 LCK LAT DIST FIB (Plate) ×2 IMPLANT
PLATE LCP 3.5 1/3 TUB 8HX93 (Plate) ×2 IMPLANT
SCREW CORTEX 3.5 12MM (Screw) ×3 IMPLANT
SCREW LOCK CORT ST 3.5X12 (Screw) ×3 IMPLANT
SCREW LOCK T15 FT 14X3.5X2.9X (Screw) ×1 IMPLANT
SCREW LOCK T15 FT 16X3.5X2.9X (Screw) ×1 IMPLANT
SCREW LOCK VA ST 2.7X10 (Screw) ×4 IMPLANT
SCREW LOCKING 2.7X16MM VA (Screw) ×6 IMPLANT
SCREW LOCKING 3.5X14 (Screw) ×1 IMPLANT
SCREW LOCKING 3.5X16 (Screw) ×1 IMPLANT
SCREW SELF TAP 14MM (Screw) ×8 IMPLANT
SCREW SHORT THREAD 4.0X46 (Screw) ×2 IMPLANT
STAPLER VISISTAT 35W (STAPLE) IMPLANT
SUCTION FRAZIER HANDLE 10FR (MISCELLANEOUS) ×2
SUCTION TUBE FRAZIER 10FR DISP (MISCELLANEOUS) ×2 IMPLANT
SUT ETHILON 2 0 PSLX (SUTURE) IMPLANT
SUT VIC AB 2-0 CT1 27 (SUTURE) ×1
SUT VIC AB 2-0 CT1 TAPERPNT 27 (SUTURE) ×1 IMPLANT
TOWEL OR 17X24 6PK STRL BLUE (TOWEL DISPOSABLE) ×2 IMPLANT
TOWEL OR 17X26 10 PK STRL BLUE (TOWEL DISPOSABLE) ×2 IMPLANT
TUBE CONNECTING 12X1/4 (SUCTIONS) ×2 IMPLANT

## 2017-10-25 NOTE — Anesthesia Procedure Notes (Signed)
Anesthesia Regional Block: Popliteal block   Pre-Anesthetic Checklist: ,, timeout performed, Correct Patient, Correct Site, Correct Laterality, Correct Procedure, Correct Position, site marked, Risks and benefits discussed,  Surgical consent,  Pre-op evaluation,  At surgeon's request and post-op pain management  Laterality: Left  Prep: chloraprep       Needles:  Injection technique: Single-shot  Needle Type: Echogenic Stimulator Needle     Needle Length: 10cm  Needle Gauge: 21     Additional Needles:   Procedures:, nerve stimulator,,,,,,,   Nerve Stimulator or Paresthesia:  Response: 0.4 mA,   Additional Responses:   Narrative:  Start time: 10/25/2017 9:30 AM End time: 10/25/2017 9:40 AM Injection made incrementally with aspirations every 5 mL.  Performed by: Personally  Anesthesiologist: Lillia Abed, MD  Additional Notes: Monitors applied. Patient sedated. Sterile prep and drape,hand hygiene and sterile gloves were used. Relevant anatomy identified.Needle position confirmed.Local anesthetic injected incrementally after negative aspiration. Local anesthetic spread visualized around nerve(s). Vascular puncture avoided. No complications. Image printed for medical record.The patient tolerated the procedure well.  Additional Saphenous nerve block performed. 15cc Local Anesthetic mixture placed under ultrasonic guidance along the medio-inferior border of the Sartorious muscle 6 inches above the knee.  No Problems encountered.  Lillia Abed MD

## 2017-10-25 NOTE — Anesthesia Preprocedure Evaluation (Signed)
Anesthesia Evaluation  Patient identified by MRN, date of birth, ID band Patient awake    Reviewed: Allergy & Precautions, NPO status , Patient's Chart, lab work & pertinent test results  Airway Mallampati: II  TM Distance: >3 FB Neck ROM: Full    Dental   Pulmonary    Pulmonary exam normal        Cardiovascular hypertension, Pt. on medications Normal cardiovascular exam     Neuro/Psych    GI/Hepatic GERD  Medicated and Controlled,  Endo/Other  diabetes, Type 2, Insulin Dependent, Oral Hypoglycemic Agents  Renal/GU Renal InsufficiencyRenal disease     Musculoskeletal   Abdominal   Peds  Hematology   Anesthesia Other Findings   Reproductive/Obstetrics                             Anesthesia Physical Anesthesia Plan  ASA: III  Anesthesia Plan: MAC and Regional   Post-op Pain Management:  Regional for Post-op pain   Induction: Intravenous  PONV Risk Score and Plan: 1 and Ondansetron  Airway Management Planned: Simple Face Mask  Additional Equipment:   Intra-op Plan:   Post-operative Plan:   Informed Consent: I have reviewed the patients History and Physical, chart, labs and discussed the procedure including the risks, benefits and alternatives for the proposed anesthesia with the patient or authorized representative who has indicated his/her understanding and acceptance.     Plan Discussed with: CRNA and Surgeon  Anesthesia Plan Comments:         Anesthesia Quick Evaluation

## 2017-10-25 NOTE — Anesthesia Procedure Notes (Signed)
Procedure Name: MAC Date/Time: 10/25/2017 10:03 AM Performed by: Candis Shine, CRNA Pre-anesthesia Checklist: Patient identified, Emergency Drugs available, Suction available, Timeout performed and Patient being monitored Patient Re-evaluated:Patient Re-evaluated prior to induction Oxygen Delivery Method: Simple face mask Dental Injury: Teeth and Oropharynx as per pre-operative assessment

## 2017-10-25 NOTE — Progress Notes (Signed)
PT Cancellation Note  Patient Details Name: ISIAH SCHEEL MRN: [id number] DOB: 1933-01-24   Cancelled Treatment:    Reason Eval/Treat Not Completed: Patient not medically ready. Pt currently on strict bedrest. Will await increased activity orders prior to initiating PT eval.    Thelma Comp 10/25/2017, 2:06 PM   Rolinda Roan, PT, DPT Acute Rehabilitation Services Pager: [phone]

## 2017-10-25 NOTE — Op Note (Signed)
10/25/2017  11:11 AM  PATIENT:  [name] [name]    PRE-OPERATIVE DIAGNOSIS:  Trimalleolar Left Ankle Fracture  POST-OPERATIVE DIAGNOSIS:  Same  PROCEDURE:  OPEN REDUCTION INTERNAL FIXATION (ORIF) LEFT ANKLE FRACTURE  SURGEON:  Newt Minion, MD  PHYSICIAN ASSISTANT:None ANESTHESIA:   General  PREOPERATIVE INDICATIONS:  [name] [name] is a  84 y.o. male with a diagnosis of Trimalleolar Left Ankle Fracture who failed conservative measures and elected for surgical management.    The risks benefits and alternatives were discussed with the patient preoperatively including but not limited to the risks of infection, bleeding, nerve injury, cardiopulmonary complications, the need for revision surgery, among others, and the patient was willing to proceed.  OPERATIVE IMPLANTS: Synthes locking plate laterally and 4.0 cannulated screws medially  OPERATIVE FINDINGS: Severe comminution of the lateral malleolus and medial malleolus  OPERATIVE PROCEDURE: Patient was brought the operating room and underwent regional block.  After adequate levels of anesthesia were obtained patient's left lower extremity was prepped using DuraPrep draped into a sterile field a timeout was called.  Patient had developed fracture blisters posterior to the fracture.  The lateral incision was made anterior to the fracture blisters.  This was carried sharply down to bone.  The bone was very soft osteoporotic and in multiple fragments.  The fragments were reduced and the fibula was provisionally fixed with a one third tubular plate.  This did not provide sufficient fixation and the plate was removed the broad locking plate was then applied laterally and this was secured with 3 proximal compression screws and 6 distal locking screws to stabilize the fragments.  C-arm fluoroscopy verified alignment and the length of the fibula.  The wound was again irrigated with normal saline and the incision was closed using 2-0 nylon.  The skin was  very thin and atrophic.  Incision was made medially over the medial malleolus sharp dissection was carried down to the fracture site.  There was comminution at the fracture site.  The fracture was reduced and stabilized with 2 K wires and secured with 246 mm 4.0 cannulated screws.  C-arm fluoroscopy verified alignment of the ankle mortise.  The medial incision was irrigated and closed with 2-0 nylon.  Patient had bad venous edema and patient was placed in a compression wrap for the left lower extremity.  Patient was taken the PACU in stable condition.   DISCHARGE PLANNING:  Antibiotic duration: 23 hours postoperatively.  Weightbearing: Strict nonweightbearing left lower extremity  Pain medication: As needed  Dressing care/ Wound VAC: Continue with the compression wrap for 1 week.  Ambulatory devices: Walker for bed to chair transfers patient most likely is not safe for gait training.  Discharge to: Discharge to skilled nursing patient has requested [facility] nursing [facility]  Follow-up: In the office 1 week post operative.

## 2017-10-25 NOTE — Anesthesia Postprocedure Evaluation (Signed)
Anesthesia Post Note  Patient: [name] [name]  Procedure(s) Performed: OPEN REDUCTION INTERNAL FIXATION (ORIF) LEFT ANKLE FRACTURE (Left Leg Lower)     Patient location during evaluation: PACU Anesthesia Type: Regional Level of consciousness: awake and alert and patient cooperative Pain management: pain level controlled Vital Signs Assessment: post-procedure vital signs reviewed and stable Respiratory status: spontaneous breathing and respiratory function stable Cardiovascular status: stable Anesthetic complications: no    Last Vitals:  Vitals:   10/25/17 1247 10/25/17 1302  BP: (!) 115/56 121/60  Pulse: 71 72  Resp: 17 18  Temp:  (!) 36.3 C  SpO2: 95% 96%    Last Pain:  Vitals:   10/25/17 1117  TempSrc:   PainSc: [place] DAVID

## 2017-10-25 NOTE — Consult Note (Signed)
ORTHOPAEDIC CONSULTATION  REQUESTING PHYSICIAN: Caren Griffins, MD  Chief Complaint: Left ankle pain swelling and deformity.  HPI: [name] [name] is a 84 y.o. male who presents with trimalleolar left ankle fracture.  Patient states he was getting up in his [place]i his right foot was up on the floor board he states that his left ankle gave way and he fell.  Patient sustained a displaced trimalleolar ankle fracture.  This was reduced and splinted in the emergency room and patient presents for internal fixation and treatment.  Past Medical History:  Diagnosis Date  . Anemia   . Atrial flutter ([place])    a. s/p RFCA of counterclockwise cavo-tricuspid isthmus dependent atrial flutter 2009 by Dr. Rayann Heman.  Marland Kitchen CAD (coronary artery disease) 06/2014   a. 06/2014: abnl nuc. Cath: Totally occluded LAD with faint collaterals (not a candidate for CTO PCI), 90% ramus s/p DES, 50-70% OM2.  . Chronic diastolic CHF (congestive heart failure) ([place])    a. 2D Echo 10/23/13: EF 50-55%, basal mid-inf HK, grade 2 DD, mild MR, mod dilated LA, no sig change from prior.  . CKD (chronic kidney disease), stage III ([place])   . Complication of anesthesia    " during my kidney stone surgery my heart went out of rhythm  . Compression fracture of L1 lumbar vertebra ([name])   . Diabetes ([place])    TYPE 2   . Dyslipidemia   . Dyspnea   . Frequent PVCs   . GERD (gastroesophageal reflux disease)   . History of blood transfusion   . History of peptic ulcer disease   . HTN (hypertension)   . Hx of acute renal failure 01/02/10-3/24-11   due to hypovolemic shock, gastroenteritis and dehydration,hospitalized . Did requre a few days of dialysis. Cr at discharge was 1.8  . Kidney disease   . Kidney stone may 2009   Right hydronephrosis, S/P stone removal  . Lower back pain   . Macular degeneration   . OA (osteoarthritis) of hip   . Obesity   . Osteopenia   . PAF (paroxysmal atrial fibrillation) ([name])    not on long term  anticoagulation due to history of heme positive stools and anemia  . Shingles    episode  . Small bowel obstruction, partial ([place]) 2009   Episode  . Ulcerative colitis ([place])    a. s/p total colectomy remotely.   Past Surgical History:  Procedure Laterality Date  . BACK SURGERY    . CARDIAC CATHETERIZATION  06/19/2014  . COLON RESECTION    . CORONARY ANGIOPLASTY    . CORONARY STENT PLACEMENT  06/19/2014   DES       dr Martinique  . HERNIA REPAIR    . LEFT AND RIGHT HEART CATHETERIZATION WITH CORONARY ANGIOGRAM N/A 06/19/2014   Procedure: LEFT AND RIGHT HEART CATHETERIZATION WITH CORONARY ANGIOGRAM;  Surgeon: Peter M Martinique, MD;  Location: [facility] CATH LAB;  Service: Cardiovascular;  Laterality: N/A;  . TONSILLECTOMY     Social History   Socioeconomic History  . Marital status: Widowed    Spouse name: None  . Number of children: None  . Years of education: None  . Highest education level: None  Social Needs  . Financial resource strain: None  . Food insecurity - worry: None  . Food insecurity - inability: None  . Transportation needs - medical: None  . Transportation needs - non-medical: None  Occupational History  . None  Tobacco Use  . Smoking  status: Never Smoker  . Smokeless tobacco: Never Used  Substance and Sexual Activity  . Alcohol use: No  . Drug use: No  . Sexual activity: None  Other Topics Concern  . None  Social History Narrative  . None   Family History  Problem Relation Age of Onset  . Colon cancer Mother    - negative except otherwise stated in the family history section Allergies  Allergen Reactions  . Brilinta [Ticagrelor] Shortness Of Breath    Changed to Plavix due to SOB  . Penicillins Rash    Has patient had a PCN reaction causing immediate rash, facial/tongue/throat swelling, SOB or lightheadedness with hypotension:Yes Has patient had a PCN reaction causing severe rash involving mucus membranes or skin necrosis: No Has patient had a PCN reaction  that required hospitalization: No Has patient had a PCN reaction occurring within the last 10 years: No If all of the above answers are "NO", then may proceed with Cephalosporin use.  . Sulfa Antibiotics     Pt does not remember the reaction, but it sure the allergy exists.   Prior to Admission medications   Medication Sig Start Date End Date Taking? Authorizing Provider  acetaminophen (TYLENOL) 500 MG tablet Take 1 tablet (500 mg total) by mouth every 6 (six) hours as needed (for pain or headaches). Available over the counter 01/08/17  Yes Hongalgi, Lenis Dickinson, MD  amiodarone (PACERONE) 200 MG tablet Take 1 tablet (200 mg total) by mouth daily. 07/03/17  Yes Sueanne Margarita, MD  aspirin 81 MG tablet Take 81 mg by mouth daily.   Yes [provider]  calcium carbonate (OS-CAL) 600 MG TABS tablet Take 600 mg by mouth daily with breakfast.   Yes [provider]  cholecalciferol (VITAMIN D) 1000 units tablet Take 3,000 Units by mouth daily.   Yes [provider]  doxycycline (VIBRA-TABS) 100 MG tablet Take 100 mg by mouth 2 (two) times daily. 10/23/17  Yes [provider]  ferrous sulfate 325 (65 FE) MG tablet Take 1 tablet (325 mg total) by mouth 2 (two) times daily with a meal. 10/27/14  Yes Florencia Reasons, MD  omeprazole (PRILOSEC) 20 MG capsule TAKE 1 CAPSULE (20 MG TOTAL) BY MOUTH DAILY. Patient taking differently: Take 20 mg by mouth at bedtime. TAKE 1 CAPSULE (20 MG TOTAL) BY MOUTH DAILY. 12/01/15  Yes Irene Shipper, MD  pioglitazone (ACTOS) 30 MG tablet Take 1 tablet (30 mg total) by mouth daily. 11/05/14  Yes Dunn, Dayna N, PA-C  ranitidine (ZANTAC) 150 MG capsule Take 150 mg by mouth daily.   Yes [provider]  saxagliptin HCl (ONGLYZA) 5 MG TABS tablet Take 1 tablet (5 mg total) by mouth daily. 06/20/14  Yes Tanda Rockers, MD  simvastatin (ZOCOR) 20 MG tablet Take 20 mg by mouth daily.   Yes [provider]  tamsulosin (FLOMAX) 0.4 MG CAPS capsule  Take 1 capsule (0.4 mg total) by mouth daily after supper. 10/27/14  Yes Florencia Reasons, MD  traMADol (ULTRAM) 50 MG tablet Take 50 mg by mouth every 6 (six) hours as needed for pain. 10/23/17  Yes [provider]  gabapentin (NEURONTIN) 100 MG capsule Take 1 capsule (100 mg total) by mouth 2 (two) times daily. Patient not taking: Reported on 06/29/2017 01/07/17   Modena Jansky, MD   Dg Lumbar Spine Complete  Result Date: 10/24/2017 CLINICAL DATA:  Fall. EXAM: LUMBAR SPINE - COMPLETE 4+ VIEW COMPARISON:  CT scan 10/22/2014.  FINDINGS: Bones are diffusely demineralized no evidence for an acute fracture in the lumbar spine or sacrum. Patient is status post vertebral augmentation at L1. Atherosclerotic calcification noted in the distal abdominal aorta. Stoma noted over the right abdomen. Surgical clips are evident in the midline pelvis and left abdomen. IMPRESSION: Osteopenia without acute bony abnormality. Electronically Signed   By: Misty Stanley M.D.   On: 10/24/2017 12:37   Dg Ankle Complete Left  Result Date: 10/24/2017 CLINICAL DATA:  Left ankle fracture.  Postreduction. EXAM: LEFT ANKLE COMPLETE - 3+ VIEW COMPARISON:  10/24/2017. FINDINGS: Continued improved alignment of trimalleolar fracture. Slight displaced comminuted fracture fragments again noted. IMPRESSION: Continued improvement of alignment of trimalleolar fracture. Electronically Signed   By: Marcello Moores  Register   On: 10/24/2017 15:46   Dg Ankle Complete Left  Result Date: 10/24/2017 CLINICAL DATA:  Postreduction films of the left ankle. EXAM: LEFT ANKLE COMPLETE - 3+ VIEW COMPARISON:  Earlier films, same date. FINDINGS: Much improved position and alignment of the trimalleolar ankle fracture in a plaster splint. The tibiotalar joint is maintained. Mild lateral ankle mortise widening and persistent moderate displacement of the distal fibular fracture at and above the level of the ankle mortise. IMPRESSION: Improved position and alignment of the  trimalleolar fractures. Persistent moderate displacement of the distal fibular fracture. Electronically Signed   By: Marijo Sanes M.D.   On: 10/24/2017 15:22   Dg Ankle Complete Left  Result Date: 10/24/2017 CLINICAL DATA:  Fall EXAM: LEFT ANKLE COMPLETE - 3+ VIEW COMPARISON:  None. FINDINGS: Fracture dislocation of the ankle. Trimalleolar fracture with lateral dislocation of the talus. Mild degenerative change in the midfoot IMPRESSION: Trimalleolar fracture dislocation of the ankle. Electronically Signed   By: Franchot Gallo M.D.   On: 10/24/2017 12:35   - pertinent xrays, CT, MRI studies were reviewed and independently interpreted  Positive ROS: All other systems have been reviewed and were otherwise negative with the exception of those mentioned in the HPI and as above.  Physical Exam: General: Alert, no acute distress Psychiatric: Patient is competent for consent with normal mood and affect Lymphatic: No axillary or cervical lymphadenopathy Cardiovascular: No pedal edema Respiratory: No cyanosis, no use of accessory musculature GI: No organomegaly, abdomen is soft and non-tender  Skin:    Neurologic: Patient does not have protective sensation bilateral lower extremities.   MUSCULOSKELETAL:  Examination patient has a palpable dorsalis pedis pulse he has good capillary refill in the toes.  Radiographs shows a displaced comminuted trimalleolar ankle fracture.  Assessment: Displaced  Assessment: Comminuted trimalleolar ankle fracture with osteoporosis with diabetic insensate neuropathy.  Plan: Plan: We will plan for open reduction internal fixation of the trimalleolar fracture.  Risks and benefits were discussed including infection neurovascular injury arthritis failure of fixation need for additional surgery.  Patient states he understands wishes to proceed at this time.  Patient will require discharge to skilled nursing.  He has been at skilled nursing several times in the past at  [facility].  Thank you for the consult and the opportunity to see [name] [name], [place] [phone] 9:03 AM

## 2017-10-25 NOTE — Transfer of Care (Signed)
Immediate Anesthesia Transfer of Care Note  Patient: [name] [name]  Procedure(s) Performed: OPEN REDUCTION INTERNAL FIXATION (ORIF) LEFT ANKLE FRACTURE (Left Leg Lower)  Patient Location: PACU  Anesthesia Type:MAC combined with regional for post-op pain  Level of Consciousness: awake, alert  and oriented  Airway & Oxygen Therapy: Patient Spontanous Breathing and Patient connected to face mask oxygen  Post-op Assessment: Report given to RN and Post -op Vital signs reviewed and stable  Post vital signs: Reviewed and stable  Last Vitals:  Vitals:   10/25/17 0940 10/25/17 0942  BP:  (!) 138/47  Pulse: 65 65  Resp: 15 18  Temp:    SpO2: 99% 100%    Last Pain:  Vitals:   10/25/17 0942  TempSrc:   PainSc: 0-No pain         Complications: No apparent anesthesia complications

## 2017-10-25 NOTE — Progress Notes (Signed)
PROGRESS NOTE  [name] [name] QIO:[id number] DOB: June 01, 1933 DOA: 10/24/2017 PCP: [name], MD   LOS: 1 day   Brief Narrative / Interim history: 84 y.o. male with medical history significant for diabetes mellitus, diabetic polyneuropathy, CKD stage 3 with baseline Cr of around 2 in 2017, chronic combined CHF (ECHO in 01/2016 showed EF of 40-45% and grade 1 DD), history of GI bleed, Atrial fibrillation on amiodarone and aspirin (due to GI bleed not on any other AC). Pt was hospitalized in 12/2016 for LE weakness attributed to diabetic polyneuropathy and was discharged to SNF. Patient now present to ED due to fall and left ankle pain. He was climbing onto the truck and slipped and fell.  He was found to have a trimalleolar fracture dislocation of the ankle on the left, orthopedic surgery was consulted  Assessment & Plan: Principal Problem:   Fall Active Problems:   CAD in native artery- total LAD, s/p RI DES 06/19/14   Dyslipidemia   CKD (chronic kidney disease), stage III ([name])   Chronic combined systolic and diastolic CHF (congestive heart failure) ([name])   Paroxysmal atrial fibrillation ([place])   Diabetes mellitus with diabetic nephropathy without long-term current use of insulin ([place])   Malleolar fracture, left, closed, initial encounter   Fibula fracture   Trimalleolar fracture of ankle, closed, left, initial encounter   Fall / Malleolar fracture, left, closed, initial encounter / Distal fibular fracture -films with trimalleolar fracture dislocation of the ankle -ankle reduced in ED -Orthopedic surgery consulted and outpatient is status post open reduction internal fixation of the left ankle, by Dr. Sharol Given on 1/9 -PT evaluation pending, likely needs SNF  CAD in native artery- total LAD, s/p RI DES 06/19/14 -Continue aspirin -No chest pain  Dyslipidemia associated with type 2 DM -Continue Zocor   CKD (chronic kidney disease), stage III ([place]) -Baseline Cr in 2017 is around 2 -Cr  is within baseline range on this admission, 1.26 this morning  Anemia of CKD / Iron deficiency anemia -Continue ferrous sulfate supplementation   Chronic combined systolic and diastolic CHF (congestive heart failure) ([place]) -ECHO in 01/2016 showed EF of 40-45% and grade 1 DD -Compensated, but has a degree of chronic lower extremity edema  Paroxysmal atrial fibrillation ([name]) -CHADS vasc score at least 5 -On amiodarone for rate control -Continue aspirin  -no full AC due to history of GI bleed   Diabetes mellitus with diabetic nephropathy without long-term current use of insulin ([name]) -A1c in 12/2016 - 6.5 -Resume Actos, Tradjenta -add SSI   BPH -Continue Flomax  Obesity  -Body mass index is 35.88 kg/m.    DVT prophylaxis: SCDs, per orthopedic surgery  Code Status: Full code Family Communication: sister at bedside Disposition Plan: SNF when ready, 2-3 days  Consultants:   Orthopedic surgery   Procedures:   ORIF left ankle fracture  Antimicrobials:  Doxycycline PTA   Subjective: - no chest pain, shortness of breath, no abdominal pain, nausea or vomiting.   Objective: Vitals:   10/25/17 1232 10/25/17 1247 10/25/17 1302 10/25/17 1330  BP: (!) 127/55 (!) 115/56 121/60 (!) 142/53  Pulse: 73 71 72 75  Resp: (!) 21 17 18 18   Temp:   (!) 97.3 F (36.3 C) 97.8 F (36.6 C)  TempSrc:    Oral  SpO2: 98% 95% 96% 96%  Weight:      Height:        Intake/Output Summary (Last 24 hours) at 10/25/2017 1557 Last data filed at 10/25/2017 1306  Gross per 24 hour  Intake 815 ml  Output 20 ml  Net 795 ml   Filed Weights   10/24/17 1512 10/25/17 0855  Weight: 107 kg (236 lb) 107 kg (236 lb)    Examination:  Constitutional: NAD Eyes: PERRL, lids and conjunctivae normal ENMT: Mucous membranes are moist. No oropharyngeal exudates Neck: normal, supple, no masses, no thyromegaly Respiratory: clear to auscultation bilaterally, no wheezing, no crackles. Normal  respiratory effort. No accessory muscle use.  Cardiovascular: Regular rate and rhythm, no murmurs / rubs / gallops. No LE edema. 2+ pedal pulses. No carotid bruits.  Abdomen: no tenderness. Bowel sounds positive.  Musculoskeletal: no clubbing / cyanosis. No joint deformity upper and lower extremities. No contractures. Normal muscle tone.  Skin: no rashes, lesions, ulcers. No induration Neurologic: CN 2-12 grossly intact. Strength 5/5 in all 4.  Psychiatric: Normal judgment and insight. Alert and oriented x 3. Normal mood.    Data Reviewed: I have independently reviewed following labs and imaging studies   CBC: Recent Labs  Lab 10/24/17 1101 10/24/17 1321 10/25/17 0538  WBC 5.8  --  7.3  NEUTROABS 4.5  --   --   HGB 11.2* 11.6* 10.3*  HCT 35.2* 34.0* 32.2*  MCV 99.2  --  100.0  PLT 215  --  960   Basic Metabolic Panel: Recent Labs  Lab 10/24/17 1321 10/25/17 0538  NA 138 137  K 4.8 4.8  CL 104 108  CO2  --  25  GLUCOSE 119* 118*  BUN 33* 27*  CREATININE 1.30* 1.26*  CALCIUM  --  8.2*   GFR: Estimated Creatinine Clearance: 53.6 mL/min (A) (by C-G formula based on SCr of 1.26 mg/dL (H)). Liver Function Tests: Recent Labs  Lab 10/25/17 0538  AST 18  ALT 13*  ALKPHOS 94  BILITOT 0.9  PROT 5.5*  ALBUMIN 2.4*   No results for input(s): LIPASE, AMYLASE in the last 168 hours. No results for input(s): AMMONIA in the last 168 hours. Coagulation Profile: No results for input(s): INR, PROTIME in the last 168 hours. Cardiac Enzymes: No results for input(s): CKTOTAL, CKMB, CKMBINDEX, TROPONINI in the last 168 hours. BNP (last 3 results) No results for input(s): PROBNP in the last 8760 hours. HbA1C: No results for input(s): HGBA1C in the last 72 hours. CBG: Recent Labs  Lab 10/24/17 2008 10/24/17 2217 10/25/17 0035 10/25/17 0848 10/25/17 1116  GLUCAP 105* 148* 120* 109* 151*   Lipid Profile: No results for input(s): CHOL, HDL, LDLCALC, TRIG, CHOLHDL, LDLDIRECT  in the last 72 hours. Thyroid Function Tests: No results for input(s): TSH, T4TOTAL, FREET4, T3FREE, THYROIDAB in the last 72 hours. Anemia Panel: No results for input(s): VITAMINB12, FOLATE, FERRITIN, TIBC, IRON, RETICCTPCT in the last 72 hours. Urine analysis:    Component Value Date/Time   COLORURINE STRAW (A) 01/04/2017 1229   APPEARANCEUR CLEAR 01/04/2017 1229   LABSPEC 1.012 01/04/2017 1229   PHURINE 5.0 01/04/2017 1229   GLUCOSEU NEGATIVE 01/04/2017 1229   HGBUR NEGATIVE 01/04/2017 1229   BILIRUBINUR NEGATIVE 01/04/2017 1229   KETONESUR NEGATIVE 01/04/2017 1229   PROTEINUR NEGATIVE 01/04/2017 1229   UROBILINOGEN 0.2 10/22/2014 1052   NITRITE NEGATIVE 01/04/2017 1229   LEUKOCYTESUR NEGATIVE 01/04/2017 1229   Sepsis Labs: Invalid input(s): PROCALCITONIN, LACTICIDVEN  No results found for this or any previous visit (from the past 240 hour(s)).    Radiology Studies: Dg Lumbar Spine Complete  Result Date: 10/24/2017 CLINICAL DATA:  Fall. EXAM: LUMBAR SPINE - COMPLETE 4+ VIEW  COMPARISON:  CT scan 10/22/2014. FINDINGS: Bones are diffusely demineralized no evidence for an acute fracture in the lumbar spine or sacrum. Patient is status post vertebral augmentation at L1. Atherosclerotic calcification noted in the distal abdominal aorta. Stoma noted over the right abdomen. Surgical clips are evident in the midline pelvis and left abdomen. IMPRESSION: Osteopenia without acute bony abnormality. Electronically Signed   By: Misty Stanley M.D.   On: 10/24/2017 12:37   Dg Ankle Complete Left  Result Date: 10/24/2017 CLINICAL DATA:  Left ankle fracture.  Postreduction. EXAM: LEFT ANKLE COMPLETE - 3+ VIEW COMPARISON:  10/24/2017. FINDINGS: Continued improved alignment of trimalleolar fracture. Slight displaced comminuted fracture fragments again noted. IMPRESSION: Continued improvement of alignment of trimalleolar fracture. Electronically Signed   By: Marcello Moores  Register   On: 10/24/2017 15:46    Dg Ankle Complete Left  Result Date: 10/24/2017 CLINICAL DATA:  Postreduction films of the left ankle. EXAM: LEFT ANKLE COMPLETE - 3+ VIEW COMPARISON:  Earlier films, same date. FINDINGS: Much improved position and alignment of the trimalleolar ankle fracture in a plaster splint. The tibiotalar joint is maintained. Mild lateral ankle mortise widening and persistent moderate displacement of the distal fibular fracture at and above the level of the ankle mortise. IMPRESSION: Improved position and alignment of the trimalleolar fractures. Persistent moderate displacement of the distal fibular fracture. Electronically Signed   By: Marijo Sanes M.D.   On: 10/24/2017 15:22   Dg Ankle Complete Left  Result Date: 10/24/2017 CLINICAL DATA:  Fall EXAM: LEFT ANKLE COMPLETE - 3+ VIEW COMPARISON:  None. FINDINGS: Fracture dislocation of the ankle. Trimalleolar fracture with lateral dislocation of the talus. Mild degenerative change in the midfoot IMPRESSION: Trimalleolar fracture dislocation of the ankle. Electronically Signed   By: Franchot Gallo M.D.   On: 10/24/2017 12:35     Scheduled Meds: . amiodarone  200 mg Oral Daily  . aspirin EC  81 mg Oral Daily  . calcium carbonate  1,250 mg Oral Q breakfast  . cholecalciferol  3,000 Units Oral Daily  . docusate sodium  100 mg Oral BID  . fentaNYL (SUBLIMAZE) injection  50 mcg Intravenous Once  . ferrous sulfate  325 mg Oral BID WC  . gabapentin  100 mg Oral BID  . insulin aspart  0-9 Units Subcutaneous TID WC  . linagliptin  5 mg Oral Daily  . midazolam  1 mg Intravenous Once  . pioglitazone  30 mg Oral Daily  . simvastatin  20 mg Oral Daily  . tamsulosin  0.4 mg Oral QPC supper   Continuous Infusions: . sodium chloride 50 mL/hr at 10/24/17 2129  . sodium chloride    .  ceFAZolin (ANCEF) IV    . lactated ringers 10 mL/hr at 10/25/17 0856  . methocarbamol (ROBAXIN)  IV      Marzetta Board, MD, PhD [facility] Pager [phone]  If  7PM-7AM, please contact night-coverage [email or web address] Password [facility] 10/25/2017, 3:57 PM

## 2017-10-25 NOTE — Progress Notes (Signed)
Orthopedic Tech Progress Note Patient Details:  [name] [name] Apr 29, 1933 [id number]  Ortho Devices Type of Ortho Device: Postop shoe/boot Splint Material: Plaster Ortho Device/Splint Location: lle Ortho Device/Splint Interventions: Application   Post Interventions Patient Tolerated: Well Instructions Provided: Care of device   [name] 10/25/2017, 2:11 PM

## 2017-10-26 ENCOUNTER — Encounter (HOSPITAL_COMMUNITY): Payer: Self-pay | Admitting: Orthopedic Surgery

## 2017-10-26 LAB — GLUCOSE, CAPILLARY
Glucose-Capillary: 131 mg/dL — ABNORMAL HIGH (ref 65–99)
Glucose-Capillary: 142 mg/dL — ABNORMAL HIGH (ref 65–99)
Glucose-Capillary: 128 mg/dL — ABNORMAL HIGH (ref 65–99)
Glucose-Capillary: 132 mg/dL — ABNORMAL HIGH (ref 65–99)

## 2017-10-26 LAB — CBC
HCT: 28.6 % — ABNORMAL LOW (ref 39.0–52.0)
Hemoglobin: 8.8 g/dL — ABNORMAL LOW (ref 13.0–17.0)
MCH: 30.8 pg (ref 26.0–34.0)
MCHC: 30.8 g/dL (ref 30.0–36.0)
MCV: 100 fL (ref 78.0–100.0)
Platelets: 195 10*3/uL (ref 150–400)
RBC: 2.86 MIL/uL — ABNORMAL LOW (ref 4.22–5.81)
RDW: 14.6 % (ref 11.5–15.5)
WBC: 8.4 10*3/uL (ref 4.0–10.5)

## 2017-10-26 LAB — BASIC METABOLIC PANEL
Anion gap: 7 (ref 5–15)
BUN: 35 mg/dL — ABNORMAL HIGH (ref 6–20)
CO2: 24 mmol/L (ref 22–32)
Calcium: 8.1 mg/dL — ABNORMAL LOW (ref 8.9–10.3)
Chloride: 104 mmol/L (ref 101–111)
Creatinine, Ser: 1.65 mg/dL — ABNORMAL HIGH (ref 0.61–1.24)
GFR calc Af Amer: 43 mL/min — ABNORMAL LOW (ref >60)
GFR calc non Af Amer: 37 mL/min — ABNORMAL LOW (ref >60)
Glucose, Bld: 129 mg/dL — ABNORMAL HIGH (ref 65–99)
Potassium: 4.7 mmol/L (ref 3.5–5.1)
Sodium: 135 mmol/L (ref 135–145)

## 2017-10-26 NOTE — Social Work (Signed)
CSW contacted sister about SNF offers and family desires [facility].  CSW advised that [facility] has offered a bed and patient/family accepts.  CSW confirmed SNF bed offer with [facility].  Elissa Hefty, LCSW Clinical Social Worker [phone]

## 2017-10-26 NOTE — Progress Notes (Signed)
Orthopedic Tech Progress Note Patient Details:  RALF KONOPKA [birth year] [id number]  Ortho Devices Type of Ortho Device: CAM walker Splint Material: Plaster Ortho Device/Splint Location: lle Ortho Device/Splint Interventions: Application   Post Interventions Patient Tolerated: Well Instructions Provided: Care of device   [name] 10/26/2017, 9:46 AM

## 2017-10-26 NOTE — Progress Notes (Signed)
PROGRESS NOTE  [name] [name] BEM:[id number] DOB: May 31, 1933 DOA: 10/24/2017 PCP: [name], MD   LOS: 2 days   Brief Narrative / Interim history: 84 y.o. male with medical history significant for diabetes mellitus, diabetic polyneuropathy, CKD stage 3 with baseline Cr of around 2 in 2017, chronic combined CHF (ECHO in 01/2016 showed EF of 40-45% and grade 1 DD), history of GI bleed, Atrial fibrillation on amiodarone and aspirin (due to GI bleed not on any other AC). Pt was hospitalized in 12/2016 for LE weakness attributed to diabetic polyneuropathy and was discharged to SNF. Patient now present to ED due to fall and left ankle pain. He was climbing onto the truck and slipped and fell.  He was found to have a trimalleolar fracture dislocation of the ankle on the left, orthopedic surgery was consulted  Assessment & Plan: Principal Problem:   Fall Active Problems:   CAD in native artery- total LAD, s/p RI DES 06/19/14   Dyslipidemia   CKD (chronic kidney disease), stage III ([name])   Chronic combined systolic and diastolic CHF (congestive heart failure) ([name])   Paroxysmal atrial fibrillation ([place])   Diabetes mellitus with diabetic nephropathy without long-term current use of insulin ([place])   Malleolar fracture, left, closed, initial encounter   Fibula fracture   Trimalleolar fracture of ankle, closed, left, initial encounter   Fall / Malleolar fracture, left, closed, initial encounter / Distal fibular fracture -films with trimalleolar fracture dislocation of the ankle -ankle reduced in ED -Orthopedic surgery consulted and outpatient is status post open reduction internal fixation of the left ankle, by Dr. Sharol Given on 1/9 -We will need SNF placement, discussed with social worker today  Acute blood loss anemia -Postop, hemoglobin 8.8 today, no need for transfusion but continue to closely monitor  CAD in native artery- total LAD, s/p RI DES 06/19/14 -Continue aspirin -No chest  pain  Dyslipidemia associated with type 2 DM -Continue Zocor   CKD (chronic kidney disease), stage III ([place]) -Baseline Cr in 2017 is around 2 -Cr is within baseline range on this admission, 1.65 this morning  Anemia of CKD / Iron deficiency anemia -Continue ferrous sulfate supplementation   Chronic combined systolic and diastolic CHF (congestive heart failure) ([place]) -ECHO in 01/2016 showed EF of 40-45% and grade 1 DD -Compensated, but has a degree of chronic lower extremity edema  Paroxysmal atrial fibrillation ([name]) -CHADS vasc score at least 5 -On amiodarone for rate control -Continue aspirin  -no full AC due to history of GI bleed   Diabetes mellitus with diabetic nephropathy without long-term current use of insulin ([name]) -A1c in 12/2016 - 6.5 -Resume Actos, Tradjenta -add SSI  -CBGs this morning 129-142, continue current regimen  BPH -Continue Flomax  Obesity  -Body mass index is 35.88 kg/m.    DVT prophylaxis: SCDs, per orthopedic surgery  Code Status: Full code Family Communication: No family at bedside Disposition Plan: SNF when ready, 2-3 days  Consultants:   Orthopedic surgery   Procedures:   ORIF left ankle fracture  Antimicrobials:  Doxycycline PTA   Subjective: -Feels well, pain is well controlled, he is eating breakfast and has no complaints otherwise  Objective: Vitals:   10/25/17 1330 10/25/17 2014 10/25/17 2356 10/26/17 0445  BP: (!) 142/53 (!) 108/50 (!) 106/51 (!) 103/49  Pulse: 75 65 67 64  Resp: 18 14 12 12   Temp: 97.8 F (36.6 C) 97.9 F (36.6 C) 97.7 F (36.5 C) 98.1 F (36.7 C)  TempSrc: Oral Oral Axillary  Oral  SpO2: 96% 94% 97% 96%  Weight:      Height:        Intake/Output Summary (Last 24 hours) at 10/26/2017 1334 Last data filed at 10/26/2017 0445 Gross per 24 hour  Intake 1140 ml  Output 1150 ml  Net -10 ml   Filed Weights   10/24/17 1512 10/25/17 0855  Weight: 107 kg (236 lb) 107 kg (236 lb)     Examination:  Constitutional: No distress, eating breakfast Eyes: No scleral icterus, normal conjunctivae Respiratory: Clear to auscultation bilaterally, no wheezing, no crackles Cardiovascular: Regular rate and rhythm, 1+ pitting lower extremity edema Abdomen: Soft, nontender, bowel sounds positive Skin: No rashes Neurologic: Nonfocal Psychiatric: Normal mood, alert and oriented x3   Data Reviewed: I have independently reviewed following labs and imaging studies   CBC: Recent Labs  Lab 10/24/17 1101 10/24/17 1321 10/25/17 0538 10/26/17 0927  WBC 5.8  --  7.3 8.4  NEUTROABS 4.5  --   --   --   HGB 11.2* 11.6* 10.3* 8.8*  HCT 35.2* 34.0* 32.2* 28.6*  MCV 99.2  --  100.0 100.0  PLT 215  --  195 110   Basic Metabolic Panel: Recent Labs  Lab 10/24/17 1321 10/25/17 0538 10/26/17 0927  NA 138 137 135  K 4.8 4.8 4.7  CL 104 108 104  CO2  --  25 24  GLUCOSE 119* 118* 129*  BUN 33* 27* 35*  CREATININE 1.30* 1.26* 1.65*  CALCIUM  --  8.2* 8.1*   GFR: Estimated Creatinine Clearance: 40.9 mL/min (A) (by C-G formula based on SCr of 1.65 mg/dL (H)). Liver Function Tests: Recent Labs  Lab 10/25/17 0538  AST 18  ALT 13*  ALKPHOS 94  BILITOT 0.9  PROT 5.5*  ALBUMIN 2.4*   No results for input(s): LIPASE, AMYLASE in the last 168 hours. No results for input(s): AMMONIA in the last 168 hours. Coagulation Profile: No results for input(s): INR, PROTIME in the last 168 hours. Cardiac Enzymes: No results for input(s): CKTOTAL, CKMB, CKMBINDEX, TROPONINI in the last 168 hours. BNP (last 3 results) No results for input(s): PROBNP in the last 8760 hours. HbA1C: No results for input(s): HGBA1C in the last 72 hours. CBG: Recent Labs  Lab 10/25/17 1116 10/25/17 1733 10/25/17 2251 10/26/17 0625 10/26/17 1221  GLUCAP 151* 209* 144* 131* 142*   Lipid Profile: No results for input(s): CHOL, HDL, LDLCALC, TRIG, CHOLHDL, LDLDIRECT in the last 72 hours. Thyroid Function  Tests: No results for input(s): TSH, T4TOTAL, FREET4, T3FREE, THYROIDAB in the last 72 hours. Anemia Panel: No results for input(s): VITAMINB12, FOLATE, FERRITIN, TIBC, IRON, RETICCTPCT in the last 72 hours. Urine analysis:    Component Value Date/Time   COLORURINE STRAW (A) 01/04/2017 1229   APPEARANCEUR CLEAR 01/04/2017 1229   LABSPEC 1.012 01/04/2017 1229   PHURINE 5.0 01/04/2017 1229   GLUCOSEU NEGATIVE 01/04/2017 1229   HGBUR NEGATIVE 01/04/2017 1229   BILIRUBINUR NEGATIVE 01/04/2017 1229   KETONESUR NEGATIVE 01/04/2017 1229   PROTEINUR NEGATIVE 01/04/2017 1229   UROBILINOGEN 0.2 10/22/2014 1052   NITRITE NEGATIVE 01/04/2017 1229   LEUKOCYTESUR NEGATIVE 01/04/2017 1229   Sepsis Labs: Invalid input(s): PROCALCITONIN, LACTICIDVEN  No results found for this or any previous visit (from the past 240 hour(s)).    Radiology Studies: Dg Ankle Complete Left  Result Date: 10/24/2017 CLINICAL DATA:  Left ankle fracture.  Postreduction. EXAM: LEFT ANKLE COMPLETE - 3+ VIEW COMPARISON:  10/24/2017. FINDINGS: Continued improved alignment of trimalleolar  fracture. Slight displaced comminuted fracture fragments again noted. IMPRESSION: Continued improvement of alignment of trimalleolar fracture. Electronically Signed   By: Marcello Moores  Register   On: 10/24/2017 15:46   Dg Ankle Complete Left  Result Date: 10/24/2017 CLINICAL DATA:  Postreduction films of the left ankle. EXAM: LEFT ANKLE COMPLETE - 3+ VIEW COMPARISON:  Earlier films, same date. FINDINGS: Much improved position and alignment of the trimalleolar ankle fracture in a plaster splint. The tibiotalar joint is maintained. Mild lateral ankle mortise widening and persistent moderate displacement of the distal fibular fracture at and above the level of the ankle mortise. IMPRESSION: Improved position and alignment of the trimalleolar fractures. Persistent moderate displacement of the distal fibular fracture. Electronically Signed   By: Marijo Sanes M.D.   On: 10/24/2017 15:22     Scheduled Meds: . amiodarone  200 mg Oral Daily  . aspirin EC  81 mg Oral Daily  . calcium carbonate  1,250 mg Oral Q breakfast  . cholecalciferol  3,000 Units Oral Daily  . docusate sodium  100 mg Oral BID  . fentaNYL (SUBLIMAZE) injection  50 mcg Intravenous Once  . ferrous sulfate  325 mg Oral BID WC  . gabapentin  100 mg Oral BID  . insulin aspart  0-9 Units Subcutaneous TID WC  . linagliptin  5 mg Oral Daily  . midazolam  1 mg Intravenous Once  . pioglitazone  30 mg Oral Daily  . simvastatin  20 mg Oral Daily  . tamsulosin  0.4 mg Oral QPC supper   Continuous Infusions: . sodium chloride 10 mL/hr at 10/25/17 1625  . lactated ringers 10 mL/hr at 10/25/17 0856  . methocarbamol (ROBAXIN)  IV      Marzetta Board, MD, PhD [facility] Pager [phone]  If 7PM-7AM, please contact night-coverage [email or web address] Password [email or web address] 10/26/2017, 1:34 PM

## 2017-10-26 NOTE — Clinical Social Work Note (Signed)
Clinical Social Work Assessment  Patient Details  Name: [name] [name] MRN: [id number] Date of Birth: 1933-02-01  Date of referral:  10/24/17               Reason for consult:  Facility Placement, Discharge Planning                Permission sought to share information with:    Permission granted to share information::     Name::     Marland Kitchen  Agency::  SNF  Relationship::  sister  Contact Information:     Housing/Transportation Living arrangements for the past 2 months:  Single Family Home Source of Information:  Patient Patient Interpreter Needed:  None Criminal Activity/Legal Involvement Pertinent to Current Situation/Hospitalization:    Significant Relationships:  Siblings Lives with:  Self Do you feel safe going back to the place where you live?  Yes Need for family participation in patient care:  Yes (Comment)  Care giving concerns:  Pt with new impairment and from home alone. Pt recommended for SNF due to impairment.   CSW met with patient at bedside and discussed SNF options/placement. Pt has been to SNF in the past and would only consider a few SNF's to go to for rehab. CSW will send out to those SNF's and f/u with patient. Pt in agreement  And gave permission to discuss with sister.   Social Worker assessment / plan:  CSW will f/u for disposition.  Employment status:  Retired Forensic scientist:  Medicare PT Recommendations:  Not assessed at this time Information / Referral to community resources:     Patient/Family's Response to care:  Pt/Family appreciative of CSW f/u for SNF placement and was very specific about the SNF's he is willing to go to for care. C  Patient/Family's Understanding of and Emotional Response to Diagnosis, Current Treatment, and Prognosis:  Pt has limited support at home and is agreement with recommendations for SNF due to his impairment. Family involved in care and are also in agreement with recommendations. Pt has experience with SNF  and acknowledges the benefit of getting short term rehab. Pt plan is return home once completed short term rehab. No other issues or concerns identified.  Emotional Assessment Appearance:  Appears stated age Attitude/Demeanor/Rapport:  (Cooperative) Affect (typically observed):  Accepting, Pleasant, Calm Orientation:  Oriented to Self, Oriented to Place, Oriented to  Time, Oriented to Situation Alcohol / Substance use:    Psych involvement (Current and /or in the community):  No (Comment)  Discharge Needs  Concerns to be addressed:  No discharge needs identified Readmission within the last 30 days:  No Current discharge risk:  None Barriers to Discharge:  No Barriers Identified   Normajean Baxter, LCSW 10/26/2017, 4:47 PM

## 2017-10-26 NOTE — NC FL2 (Signed)
[place] LEVEL OF CARE SCREENING TOOL     IDENTIFICATION  Patient Name: [name] [name] Birthdate: 10/15/1933 Sex: male Admission Date (Current Location): 10/24/2017  [facility] and [place] Number:  Herbalist and Address:  [facility] [place] [place] [address], [place], [place] [address]      Provider Number: [id number]  Attending Physician Name and Address:  Caren Griffins, MD  Relative Name and Phone Number:  Azucena Freed, cell, [phone]    Current Level of Care: Hospital Recommended Level of Care: [place] Prior Approval Number:    Date Approved/Denied:   PASRR Number:    Discharge Plan: SNF    Current Diagnoses: Patient Active Problem List   Diagnosis Date Noted  . Trimalleolar fracture of ankle, closed, left, initial encounter   . Fall 10/24/2017  . Malleolar fracture, left, closed, initial encounter 10/24/2017  . Fibula fracture 10/24/2017  . Diabetes mellitus with diabetic nephropathy without long-term current use of insulin ([place]) 01/05/2017  . CKD (chronic kidney disease), stage III ([place])   . Chronic combined systolic and diastolic CHF (congestive heart failure) ([place])   . Paroxysmal atrial fibrillation ([name])   . Bleeding gastrointestinal   . Dyslipidemia 10/14/2014  . CAD in native artery- total LAD, s/p RI DES 06/19/14 07/11/2014  . Ulcerative colitis ([place])     Orientation RESPIRATION BLADDER Height & Weight     Self, Time, Situation, Place  O2(Nasal Cannula 2L) Continent Weight: 236 lb (107 kg) Height:  5' 8"  (172.7 cm)  BEHAVIORAL SYMPTOMS/MOOD NEUROLOGICAL BOWEL NUTRITION STATUS      Colostomy Diet(See DC Summary)  AMBULATORY STATUS COMMUNICATION OF NEEDS Skin   Extensive Assist Verbally Surgical wounds                       Personal Care Assistance Level of Assistance  Dressing, Feeding, Bathing Bathing Assistance: Maximum assistance Feeding assistance: Independent Dressing Assistance:  Limited assistance     Functional Limitations Info  Sight, Hearing, Speech Sight Info: Adequate Hearing Info: Adequate Speech Info: Adequate    SPECIAL CARE FACTORS FREQUENCY  PT (By licensed PT), OT (By licensed OT)     PT Frequency: 5x week OT Frequency: 5x week            Contractures      Additional Factors Info  Code Status, Allergies Code Status Info: Full Allergies Info: BRILINTA TICAGRELOR, PENICILLINS, SULFA ANTIBIOTICS            Current Medications (10/26/2017):  This is the current hospital active medication list Current Facility-Administered Medications  Medication Dose Route Frequency Provider Last Rate Last Dose  . 0.9 %  sodium chloride infusion   Intravenous Continuous Newt Minion, MD 10 mL/hr at 10/25/17 1625    . acetaminophen (TYLENOL) tablet 650 mg  650 mg Oral Q4H PRN Newt Minion, MD   650 mg at 10/26/17 1500   Or  . acetaminophen (TYLENOL) suppository 650 mg  650 mg Rectal Q4H PRN Newt Minion, MD      . acetaminophen (TYLENOL) tablet 500 mg  500 mg Oral Q6H PRN Theodis Blaze, MD      . amiodarone (PACERONE) tablet 200 mg  200 mg Oral Daily Theodis Blaze, MD   200 mg at 10/26/17 0855  . aspirin EC tablet 81 mg  81 mg Oral Daily Theodis Blaze, MD   81 mg at 10/26/17 0856  .  bisacodyl (DULCOLAX) suppository 10 mg  10 mg Rectal Daily PRN Newt Minion, MD      . calcium carbonate (OS-CAL - dosed in mg of elemental calcium) tablet 1,250 mg  1,250 mg Oral Q breakfast Theodis Blaze, MD   1,250 mg at 10/26/17 0856  . cholecalciferol (VITAMIN D) tablet 3,000 Units  3,000 Units Oral Daily Theodis Blaze, MD   3,000 Units at 10/26/17 [phone]  . docusate sodium (COLACE) capsule 100 mg  100 mg Oral BID Newt Minion, MD   100 mg at 10/26/17 0856  . fentaNYL (SUBLIMAZE) injection 50 mcg  50 mcg Intravenous Once Lillia Abed, MD      . ferrous sulfate tablet 325 mg  325 mg Oral BID WC Theodis Blaze, MD   325 mg at 10/26/17 0857  . gabapentin  (NEURONTIN) capsule 100 mg  100 mg Oral BID Theodis Blaze, MD   100 mg at 10/25/17 2243  . HYDROmorphone (DILAUDID) injection 1 mg  1 mg Intravenous Q2H PRN Theodis Blaze, MD   1 mg at 10/25/17 0758  . insulin aspart (novoLOG) injection 0-9 Units  0-9 Units Subcutaneous TID WC Theodis Blaze, MD   1 Units at 10/26/17 1232  . lactated ringers infusion   Intravenous Continuous Lillia Abed, MD 10 mL/hr at 10/25/17 [phone]    . linagliptin (TRADJENTA) tablet 5 mg  5 mg Oral Daily Theodis Blaze, MD   5 mg at 10/26/17 0856  . magnesium citrate solution 1 Bottle  1 Bottle Oral Once PRN Newt Minion, MD      . methocarbamol (ROBAXIN) tablet 500 mg  500 mg Oral Q6H PRN Newt Minion, MD   500 mg at 10/26/17 1458   Or  . methocarbamol (ROBAXIN) 500 mg in dextrose 5 % 50 mL IVPB  500 mg Intravenous Q6H PRN Newt Minion, MD      . metoCLOPramide (REGLAN) tablet 5-10 mg  5-10 mg Oral Q8H PRN Newt Minion, MD       Or  . metoCLOPramide (REGLAN) injection 5-10 mg  5-10 mg Intravenous Q8H PRN Newt Minion, MD      . midazolam (VERSED) injection 1 mg  1 mg Intravenous Once Lillia Abed, MD      . ondansetron [facility]) tablet 4 mg  4 mg Oral Q6H PRN Newt Minion, MD       Or  . ondansetron [facility]) injection 4 mg  4 mg Intravenous Q6H PRN Newt Minion, MD      . pioglitazone (ACTOS) tablet 30 mg  30 mg Oral Daily Theodis Blaze, MD      . polyethylene glycol (MIRALAX / GLYCOLAX) packet 17 g  17 g Oral Daily PRN Newt Minion, MD      . simvastatin (ZOCOR) tablet 20 mg  20 mg Oral Daily Theodis Blaze, MD   20 mg at 10/26/17 0856  . tamsulosin (FLOMAX) capsule 0.4 mg  0.4 mg Oral QPC supper Theodis Blaze, MD   0.4 mg at 10/25/17 1759     Discharge Medications: Please see discharge summary for a list of discharge medications.  Relevant Imaging Results:  Relevant Lab Results:   Additional Information SS#:[id number]  Normajean Baxter, LCSW

## 2017-10-26 NOTE — Evaluation (Signed)
Physical Therapy Evaluation Patient Details Name: [name] [name] MRN: [id number] DOB: January 05, 1933 Today's Date: 10/26/2017   History of Present Illness  84 y.o. male s/p L ankle ORIF 1/09. PMH includes:  CAD, CHF, CKD III, HTN, Dyslipidemia, Obesity, Osteopenia, paroxysmal atrial fibrillation, Dyspnea, Diabetes, Colon resection, back surgery, coronary stent placement.     Clinical Impression  Patient is s/p above surgery resulting in functional limitations due to the deficits listed below (see PT Problem List). PTA, patient was living alone, ambulating with RW with history of falls. Upon eval, patient presents with post op pain pain and weakness, fatigue, and overall deconditioning that limits his mobility. Currently Mod A x2 level for all mobility and unable to progress to ambulation this session. Patient all struggles with adhering to NWB status during functional mobility. Next PT visit should focus on transfers and reinforcing WB status.  Patient will benefit from skilled PT to increase their independence and safety with mobility to allow discharge to the venue listed below.       Follow Up Recommendations SNF;Supervision/Assistance - 24 hour    Equipment Recommendations  None recommended by PT    Recommendations for Other Services       Precautions / Restrictions Precautions Precautions: Fall Precaution Comments: hx of falls Restrictions Weight Bearing Restrictions: Yes LLE Weight Bearing: Non weight bearing      Mobility  Bed Mobility Overal bed mobility: Needs Assistance Bed Mobility: Supine to Sit     Supine to sit: Mod assist     General bed mobility comments: Mod A x2 for bed mobility, to support Trunk and assist LLE over EOB. Patient able to tolerate sitting EOB for >5 minutes   Transfers Overall transfer level: Needs assistance Equipment used: Rolling walker (2 wheeled) Transfers: Sit to/from [facility] Sit to Stand: Mod assist;+2 physical  assistance Stand pivot transfers: Mod assist;+2 physical assistance       General transfer comment: Mod A x2 to power up and assist in stand pivoting into bedside chair. Patient unable to follow WB precautions at this time, therapist to block touch down while patient wearing CAM boot. mulitmodal cues for WB status. Patient fatigues quickly with transfers.   Ambulation/Gait             General Gait Details: unable  Stairs            Wheelchair Mobility    Modified Rankin (Stroke Patients Only)       Balance Overall balance assessment: History of Falls                                           Pertinent Vitals/Pain Pain Assessment: Faces Faces Pain Scale: Hurts even more Pain Location: L ankle Pain Descriptors / Indicators: Operative site guarding;Discomfort;Grimacing Pain Intervention(s): Limited activity within patient's tolerance;Monitored during session;Premedicated before session    Home Living Family/patient expects to be discharged to:: Private residence Living Arrangements: Alone Available Help at Discharge: Family;Available PRN/intermittently Type of Home: House Home Access: Stairs to enter;Ramped entrance Entrance Stairs-Rails: None   Home Layout: One level Home Equipment: Walker - 2 wheels;Walker - 4 wheels;Cane - single point;Bedside commode      Prior Function Level of Independence: Independent with assistive device(s)         Comments: ambulating with RW in the community.     Hand Dominance   Dominant Hand: Right  Extremity/Trunk Assessment   Upper Extremity Assessment Upper Extremity Assessment: Generalized weakness    Lower Extremity Assessment Lower Extremity Assessment: Generalized weakness(3+/5 gross BL strength. )       Communication   Communication: No difficulties  Cognition Arousal/Alertness: Awake/alert Behavior During Therapy: WFL for tasks assessed/performed Overall Cognitive Status: Within  Functional Limits for tasks assessed                                        General Comments      Exercises     Assessment/Plan    PT Assessment Patient needs continued PT services  PT Problem List Decreased strength;Decreased range of motion;Decreased activity tolerance;Decreased balance;Decreased mobility;Decreased coordination;Decreased knowledge of use of DME;Decreased safety awareness;Pain;Obesity       PT Treatment Interventions DME instruction;Gait training;Stair training;Functional mobility training;Therapeutic activities;Therapeutic exercise    PT Goals (Current goals can be found in the Care Plan section)  Acute Rehab PT Goals Patient Stated Goal: go to rehab  PT Goal Formulation: With patient/family Time For Goal Achievement: 11/02/17 Potential to Achieve Goals: Good    Frequency Min 3X/week   Barriers to discharge Decreased caregiver support Lives Alone    Co-evaluation               AM-PAC PT "6 Clicks" Daily Activity  Outcome Measure Difficulty turning over in bed (including adjusting bedclothes, sheets and blankets)?: Unable Difficulty moving from lying on back to sitting on the side of the bed? : Unable Difficulty sitting down on and standing up from a chair with arms (e.g., wheelchair, bedside commode, etc,.)?: Unable Help needed moving to and from a bed to chair (including a wheelchair)?: A Lot Help needed walking in hospital room?: Total Help needed climbing 3-5 steps with a railing? : Total 6 Click Score: 7    End of Session Equipment Utilized During Treatment: Gait belt Activity Tolerance: Patient limited by fatigue Patient left: in chair;with call bell/phone within reach;with family/visitor present;with nursing/sitter in room Nurse Communication: Mobility status PT Visit Diagnosis: Unsteadiness on feet (R26.81);Other abnormalities of gait and mobility (R26.89);Muscle weakness (generalized) (M62.81);History of falling  (Z91.81);Pain;Difficulty in walking, not elsewhere classified (R26.2) Pain - Right/Left: Left Pain - part of body: Ankle and joints of foot    Time: 1032-1100 PT Time Calculation (min) (ACUTE ONLY): 28 min   Charges:   PT Evaluation $PT Eval Moderate Complexity: 1 Mod PT Treatments $Therapeutic Activity: 8-22 mins   PT G Codes:        Reinaldo Berber, PT, DPT Acute Rehab Services Pager: [phone]    Reinaldo Berber 10/26/2017, 11:28 AM

## 2017-10-26 NOTE — Progress Notes (Signed)
Patient ID: [name] [name], male   DOB: [birth year], 84 y.o.   MRN: [id number] Postoperative status post open reduction internal fixation comminuted trimalleolar left ankle fracture.  Patient had significant comminution of the fractures with osteoporosis.  Patient will need to be strict nonweightbearing on the left lower extremity.  Will need to wear the cam walker on the left.  Patient states that he wants to go to [place] home.

## 2017-10-27 ENCOUNTER — Ambulatory Visit: Payer: Medicare Other | Admitting: Podiatry

## 2017-10-27 DIAGNOSIS — R2681 Unsteadiness on feet: Secondary | ICD-10-CM | POA: Diagnosis not present

## 2017-10-27 DIAGNOSIS — S82832D Other fracture of upper and lower end of left fibula, subsequent encounter for closed fracture with routine healing: Secondary | ICD-10-CM | POA: Diagnosis not present

## 2017-10-27 DIAGNOSIS — W19XXXD Unspecified fall, subsequent encounter: Secondary | ICD-10-CM | POA: Diagnosis not present

## 2017-10-27 DIAGNOSIS — M79609 Pain in unspecified limb: Secondary | ICD-10-CM | POA: Diagnosis not present

## 2017-10-27 DIAGNOSIS — S31000A Unspecified open wound of lower back and pelvis without penetration into retroperitoneum, initial encounter: Secondary | ICD-10-CM | POA: Diagnosis not present

## 2017-10-27 DIAGNOSIS — R278 Other lack of coordination: Secondary | ICD-10-CM | POA: Diagnosis not present

## 2017-10-27 DIAGNOSIS — S82852A Displaced trimalleolar fracture of left lower leg, initial encounter for closed fracture: Secondary | ICD-10-CM | POA: Diagnosis not present

## 2017-10-27 DIAGNOSIS — I5042 Chronic combined systolic (congestive) and diastolic (congestive) heart failure: Secondary | ICD-10-CM

## 2017-10-27 DIAGNOSIS — S82852D Displaced trimalleolar fracture of left lower leg, subsequent encounter for closed fracture with routine healing: Secondary | ICD-10-CM | POA: Diagnosis not present

## 2017-10-27 DIAGNOSIS — M6389 Disorders of muscle in diseases classified elsewhere, multiple sites: Secondary | ICD-10-CM | POA: Diagnosis not present

## 2017-10-27 DIAGNOSIS — I480 Paroxysmal atrial fibrillation: Secondary | ICD-10-CM

## 2017-10-27 DIAGNOSIS — I4892 Unspecified atrial flutter: Secondary | ICD-10-CM | POA: Diagnosis not present

## 2017-10-27 DIAGNOSIS — E119 Type 2 diabetes mellitus without complications: Secondary | ICD-10-CM | POA: Diagnosis not present

## 2017-10-27 DIAGNOSIS — M6281 Muscle weakness (generalized): Secondary | ICD-10-CM | POA: Diagnosis not present

## 2017-10-27 DIAGNOSIS — R2689 Other abnormalities of gait and mobility: Secondary | ICD-10-CM | POA: Diagnosis not present

## 2017-10-27 DIAGNOSIS — S82851A Displaced trimalleolar fracture of right lower leg, initial encounter for closed fracture: Secondary | ICD-10-CM | POA: Diagnosis not present

## 2017-10-27 DIAGNOSIS — S8292XD Unspecified fracture of left lower leg, subsequent encounter for closed fracture with routine healing: Secondary | ICD-10-CM | POA: Diagnosis not present

## 2017-10-27 DIAGNOSIS — G629 Polyneuropathy, unspecified: Secondary | ICD-10-CM | POA: Diagnosis not present

## 2017-10-27 DIAGNOSIS — R41841 Cognitive communication deficit: Secondary | ICD-10-CM | POA: Diagnosis not present

## 2017-10-27 DIAGNOSIS — Z4789 Encounter for other orthopedic aftercare: Secondary | ICD-10-CM | POA: Diagnosis not present

## 2017-10-27 DIAGNOSIS — N183 Chronic kidney disease, stage 3 (moderate): Secondary | ICD-10-CM

## 2017-10-27 DIAGNOSIS — F3289 Other specified depressive episodes: Secondary | ICD-10-CM | POA: Diagnosis not present

## 2017-10-27 DIAGNOSIS — E1121 Type 2 diabetes mellitus with diabetic nephropathy: Secondary | ICD-10-CM | POA: Diagnosis not present

## 2017-10-27 DIAGNOSIS — I2510 Atherosclerotic heart disease of native coronary artery without angina pectoris: Secondary | ICD-10-CM | POA: Diagnosis not present

## 2017-10-27 DIAGNOSIS — I5043 Acute on chronic combined systolic (congestive) and diastolic (congestive) heart failure: Secondary | ICD-10-CM | POA: Diagnosis not present

## 2017-10-27 DIAGNOSIS — T7840XD Allergy, unspecified, subsequent encounter: Secondary | ICD-10-CM | POA: Diagnosis not present

## 2017-10-27 DIAGNOSIS — S90929A Unspecified superficial injury of unspecified foot, initial encounter: Secondary | ICD-10-CM | POA: Diagnosis not present

## 2017-10-27 LAB — CBC
HCT: 26.9 % — ABNORMAL LOW (ref 39.0–52.0)
Hemoglobin: 8.5 g/dL — ABNORMAL LOW (ref 13.0–17.0)
MCH: 31.7 pg (ref 26.0–34.0)
MCHC: 31.6 g/dL (ref 30.0–36.0)
MCV: 100.4 fL — ABNORMAL HIGH (ref 78.0–100.0)
Platelets: 168 10*3/uL (ref 150–400)
RBC: 2.68 MIL/uL — ABNORMAL LOW (ref 4.22–5.81)
RDW: 14.8 % (ref 11.5–15.5)
WBC: 6.8 10*3/uL (ref 4.0–10.5)

## 2017-10-27 LAB — BASIC METABOLIC PANEL
Anion gap: 6 (ref 5–15)
BUN: 44 mg/dL — ABNORMAL HIGH (ref 6–20)
CO2: 25 mmol/L (ref 22–32)
Calcium: 8.2 mg/dL — ABNORMAL LOW (ref 8.9–10.3)
Chloride: 104 mmol/L (ref 101–111)
Creatinine, Ser: 1.71 mg/dL — ABNORMAL HIGH (ref 0.61–1.24)
GFR calc Af Amer: 41 mL/min — ABNORMAL LOW (ref >60)
GFR calc non Af Amer: 36 mL/min — ABNORMAL LOW (ref >60)
Glucose, Bld: 145 mg/dL — ABNORMAL HIGH (ref 65–99)
Potassium: 5 mmol/L (ref 3.5–5.1)
Sodium: 135 mmol/L (ref 135–145)

## 2017-10-27 LAB — GLUCOSE, CAPILLARY
Glucose-Capillary: 118 mg/dL — ABNORMAL HIGH (ref 65–99)
Glucose-Capillary: 151 mg/dL — ABNORMAL HIGH (ref 65–99)

## 2017-10-27 MED ORDER — DOCUSATE SODIUM 100 MG PO CAPS: 100.0000 mg | ORAL_CAPSULE | Freq: Two times a day (BID) | ORAL | 0 refills | Status: DC

## 2017-10-27 MED ORDER — OXYCODONE-ACETAMINOPHEN 10-325 MG PO TABS: 1.0000 | ORAL_TABLET | ORAL | 0 refills | Status: DC | PRN

## 2017-10-27 NOTE — Progress Notes (Signed)
Patient ID: [name] [name], male   DOB: 1933/06/28, 84 y.o.   MRN: [id number] Patient is postoperative day 2 open reduction internal fixation for comminuted trimalleolar left ankle fracture.  The dressing is clean and dry.  Patient is to be strict nonweightbearing on the left lower extremity he needs to wear the fracture boot for all transfers.  Patient's muscle relaxant was discontinued patient did become hypoxic after his most recent dose.  Patient will need discharge to skilled nursing.

## 2017-10-27 NOTE — Progress Notes (Signed)
Report called to Olen Cordial, LPN at [facility]. [facility] previously left with patient.  Sister called during report and informed her that patient had recently left

## 2017-10-27 NOTE — Social Work (Signed)
Clinical Social Worker facilitated patient discharge including contacting patient family and facility to confirm patient discharge plans.  Clinical information faxed to facility and family agreeable with plan.    CSW arranged ambulance transport via [facility] to [facility].    RN to call [phone] to give report prior to discharge.  Pt going to Room 106.  Clinical Social Worker will sign off for now as social work intervention is no longer needed. Please consult [place] again if new need arises.  Elissa Hefty, LCSW Clinical Social Worker [phone]

## 2017-10-27 NOTE — Discharge Summary (Signed)
Physician Discharge Summary  [name] [name]  BEE:[id number]  DOB: Mar 06, 1933  DOA: 10/24/2017 PCP: [name], MD  Admit date: 10/24/2017 Discharge date: 10/27/2017  Admitted From: Home  Disposition:  SNF   Recommendations for Outpatient Follow-up:  1. Follow up with SNF provider at her earliest convenience 2. Please obtain BMP/CBC in one week to monitor creatinine and hemoglobin 3. Follow-up with orthopedic surgery Dr Sharol Given in 1 week 4. Strict nonweightbearing on the left extremity, needs to wear fracture boot for all transfers 5. Sleep studies as an outpatient patient with undiagnosed OSA  Discharge Condition: Stable CODE STATUS: Full code Diet recommendation: Heart Healthy / Carb Modified   Brief/Interim Summary: For full details see H&P/progress note but in brief, 84 y.o.malewith medical history significantfor diabetes mellitus, diabetic polyneuropathy, CKD stage 3 with baseline Cr of around 2 in 2017, chronic combinedCHF(ECHO in 01/2016 showed EF of 40-45% and grade 1 DD), history of GI bleed, Atrial fibrillation on amiodarone and aspirin (due to GI bleed not on any other AC). Pt was hospitalized in 12/2016 for LE weakness attributed to diabetic polyneuropathy and was discharged to SNF. Patientnowpresent to ED due to fall and left ankle pain. He was climbing onto the truck and slipped and fell.  He was found to have a trimalleolar fracture dislocation of the ankle on the left, orthopedic surgery was consulted.  Ankle was reduced in the ED and subsequently patient underwent ORIF on 10/25/2017.  Patient has uneventful postop recovery.  Patient was evaluated by physical therapy recommended SNF placement for short-term rehab.  Orthopedic surgery clear patient for discharge.  Other chronic conditions were stable during hospital stay.  Subjective: Patient seen and examined, complaining of ankle pain.  No other concerns.  Overnight had to wear oxygen as after muscle relaxant was given  became hypoxic to the high 80s.  Patient likely has OSA which is undiagnosed, was supposed to have sleep studies but never did.  Hypoxia could have been attributed to OSA.  Discharge Diagnoses/Hospital Course:  Mechanical fall/malleolar fracture of the left/distal fibular fracture Ankle was reduced in the emergency department, orthopedic surgeon performed on brief on 1/9 Strict nonweightbearing on the left extremity PT surgery recommending short-term rehab Continue pain medication as needed  Acute blood loss anemia/anemia of chronic disease (CKD) Postop Hemoglobin remains stable Continue iron supplementation No need for transfusion at this time Check CBC in 1 week  CAD asymptomatic On ASA  Chronic kidney disease stage IIIDictation #1 JOI:[id number]  BRA:[id number]  Baseline creatinine around 2 Creatinine during hospital stay remained stable. Check CMP in 1 week  Chronic combined systolic and diastolic CHF Compensated at this time Continue home medications without any changes  PAF Chads Vasc 5 On amiodarone for rate control On aspirin for anticoagulation.  Not on full AC due to history of GI bleed Follow-up as an outpatient  Diabetes mellitus type 2 CBGs remained stable during hospital stay Continue Actos and Tradjenta Follow-up as an outpatient  Undiagnosed OSA Had a mild episode of hypoxia overnight which resolved with 1 L of nasal cannula now at room air.  Recommend to obtain outpatient sleep studies for possible CPAP  All other chronic medical condition were stable during the hospitalization.  Patient was seen by physical therapy, recommending SNF On the day of the discharge the patient's vitals were stable, and no other acute medical condition were reported by patient. the patient was felt safe to be discharge to SNF  Discharge Instructions  You were cared for by  a hospitalist during your hospital stay. If you have any questions about your discharge medications or  the care you received while you were in the hospital after you are discharged, you can call the unit and asked to speak with the hospitalist on call if the hospitalist that took care of you is not available. Once you are discharged, your primary care physician will handle any further medical issues. Please note that NO REFILLS for any discharge medications will be authorized once you are discharged, as it is imperative that you return to your primary care physician (or establish a relationship with a primary care physician if you do not have one) for your aftercare needs so that they can reassess your need for medications and monitor your lab values.  Discharge Instructions    Elevate operative extremity   Complete by:  As directed    Non weight bearing   Complete by:  As directed    Laterality:  left   Extremity:  Lower     Allergies as of 10/27/2017      Reactions   Brilinta [ticagrelor] Shortness Of Breath   Changed to Plavix due to SOB   Penicillins Rash   Has patient had a PCN reaction causing immediate rash, facial/tongue/throat swelling, SOB or lightheadedness with hypotension:Yes Has patient had a PCN reaction causing severe rash involving mucus membranes or skin necrosis: No Has patient had a PCN reaction that required hospitalization: No Has patient had a PCN reaction occurring within the last 10 years: No If all of the above answers are "NO", then may proceed with Cephalosporin use.   Sulfa Antibiotics    Pt does not remember the reaction, but it sure the allergy exists.      Medication List    STOP taking these medications   doxycycline 100 MG tablet Commonly known as:  VIBRA-TABS   omeprazole 20 MG capsule Commonly known as:  PRILOSEC   traMADol 50 MG tablet Commonly known as:  ULTRAM     TAKE these medications   acetaminophen 500 MG tablet Commonly known as:  TYLENOL Take 1 tablet (500 mg total) by mouth every 6 (six) hours as needed (for pain or headaches).  Available over the counter   amiodarone 200 MG tablet Commonly known as:  PACERONE Take 1 tablet (200 mg total) by mouth daily.   aspirin 81 MG tablet Take 81 mg by mouth daily.   calcium carbonate 600 MG Tabs tablet Commonly known as:  OS-CAL Take 600 mg by mouth daily with breakfast.   cholecalciferol 1000 units tablet Commonly known as:  VITAMIN D Take 3,000 Units by mouth daily.   docusate sodium 100 MG capsule Commonly known as:  COLACE Take 1 capsule (100 mg total) by mouth 2 (two) times daily.   ferrous sulfate 325 (65 FE) MG tablet Take 1 tablet (325 mg total) by mouth 2 (two) times daily with a meal.   gabapentin 100 MG capsule Commonly known as:  NEURONTIN Take 1 capsule (100 mg total) by mouth 2 (two) times daily.   oxyCODONE-acetaminophen 10-325 MG tablet Commonly known as:  PERCOCET Take 1 tablet by mouth every 4 (four) hours as needed for pain.   pioglitazone 30 MG tablet Commonly known as:  ACTOS Take 1 tablet (30 mg total) by mouth daily.   ranitidine 150 MG capsule Commonly known as:  ZANTAC Take 150 mg by mouth daily.   saxagliptin HCl 5 MG Tabs tablet Commonly known as:  ONGLYZA Take 1  tablet (5 mg total) by mouth daily.   simvastatin 20 MG tablet Commonly known as:  ZOCOR Take 20 mg by mouth daily.   tamsulosin 0.4 MG Caps capsule Commonly known as:  FLOMAX Take 1 capsule (0.4 mg total) by mouth daily after supper.            Discharge Care Instructions  (From admission, onward)        Start     Ordered   10/26/17 0000  Non weight bearing    Question Answer Comment  Laterality left   Extremity Lower      10/26/17 0654      Contact information for follow-up providers    Newt Minion, MD Follow up in 1 week(s).   Specialty:  Orthopedic Surgery Contact information: [place] [address] [phone]            Contact information for after-discharge care    Destination    [facility] Follow up.   Service:  Skilled Nursing Contact information: [place] [phone]                 Allergies  Allergen Reactions  . Brilinta [Ticagrelor] Shortness Of Breath    Changed to Plavix due to SOB  . Penicillins Rash    Has patient had a PCN reaction causing immediate rash, facial/tongue/throat swelling, SOB or lightheadedness with hypotension:Yes Has patient had a PCN reaction causing severe rash involving mucus membranes or skin necrosis: No Has patient had a PCN reaction that required hospitalization: No Has patient had a PCN reaction occurring within the last 10 years: No If all of the above answers are "NO", then may proceed with Cephalosporin use.  . Sulfa Antibiotics     Pt does not remember the reaction, but it sure the allergy exists.    Consultations:  Orthopedic surgery - Dr. Sharol Given    Procedures/Studies: Dg Lumbar Spine Complete  Result Date: 10/24/2017 CLINICAL DATA:  Fall. EXAM: LUMBAR SPINE - COMPLETE 4+ VIEW COMPARISON:  CT scan 10/22/2014. FINDINGS: Bones are diffusely demineralized no evidence for an acute fracture in the lumbar spine or sacrum. Patient is status post vertebral augmentation at L1. Atherosclerotic calcification noted in the distal abdominal aorta. Stoma noted over the right abdomen. Surgical clips are evident in the midline pelvis and left abdomen. IMPRESSION: Osteopenia without acute bony abnormality. Electronically Signed   By: Misty Stanley M.D.   On: 10/24/2017 12:37   Dg Ankle Complete Left  Result Date: 10/24/2017 CLINICAL DATA:  Left ankle fracture.  Postreduction. EXAM: LEFT ANKLE COMPLETE - 3+ VIEW COMPARISON:  10/24/2017. FINDINGS: Continued improved alignment of trimalleolar fracture. Slight displaced comminuted fracture fragments again noted. IMPRESSION: Continued improvement of alignment of trimalleolar fracture. Electronically Signed   By: Marcello Moores  Register    On: 10/24/2017 15:46   Dg Ankle Complete Left  Result Date: 10/24/2017 CLINICAL DATA:  Postreduction films of the left ankle. EXAM: LEFT ANKLE COMPLETE - 3+ VIEW COMPARISON:  Earlier films, same date. FINDINGS: Much improved position and alignment of the trimalleolar ankle fracture in a plaster splint. The tibiotalar joint is maintained. Mild lateral ankle mortise widening and persistent moderate displacement of the distal fibular fracture at and above the level of the ankle mortise. IMPRESSION: Improved position and alignment of the trimalleolar fractures. Persistent moderate displacement of the distal fibular fracture. Electronically Signed   By: Marijo Sanes M.D.   On: 10/24/2017  15:22   Dg Ankle Complete Left  Result Date: 10/24/2017 CLINICAL DATA:  Fall EXAM: LEFT ANKLE COMPLETE - 3+ VIEW COMPARISON:  None. FINDINGS: Fracture dislocation of the ankle. Trimalleolar fracture with lateral dislocation of the talus. Mild degenerative change in the midfoot IMPRESSION: Trimalleolar fracture dislocation of the ankle. Electronically Signed   By: Franchot Gallo M.D.   On: 10/24/2017 12:35    Discharge Exam: Vitals:   10/27/17 0635 10/27/17 1049  BP: (!) 123/43 (!) 123/56  Pulse: 68 71  Resp: 16 16  Temp: 97.7 F (36.5 C)   SpO2: 94% 93%   Vitals:   10/26/17 1330 10/26/17 2025 10/27/17 0635 10/27/17 1049  BP: (!) 153/59 (!) 120/48 (!) 123/43 (!) 123/56  Pulse: 80 65 68 71  Resp: 14 16 16 16   Temp: 98 F (36.7 C) 98 F (36.7 C) 97.7 F (36.5 C)   TempSrc: Oral Oral Oral   SpO2: 95% 96% 94% 93%  Weight:      Height:        General: NAD Cardiovascular: RRR, S1/S2 +, no rubs, no gallops Respiratory: CTA bilaterally, no wheezing, no rhonchi Extremities: Trace bilateral edema, dressing in left ankle c/d/i   The results of significant diagnostics from this hospitalization (including imaging, microbiology, ancillary and laboratory) are listed below for reference.     Microbiology: No  results found for this or any previous visit (from the past 240 hour(s)).   Labs: BNP (last 3 results) No results for input(s): BNP in the last 8760 hours. Basic Metabolic Panel: Recent Labs  Lab 10/24/17 1321 10/25/17 0538 10/26/17 0927 10/27/17 0607  NA 138 137 135 135  K 4.8 4.8 4.7 5.0  CL 104 108 104 104  CO2  --  25 24 25   GLUCOSE 119* 118* 129* 145*  BUN 33* 27* 35* 44*  CREATININE 1.30* 1.26* 1.65* 1.71*  CALCIUM  --  8.2* 8.1* 8.2*   Liver Function Tests: Recent Labs  Lab 10/25/17 0538  AST 18  ALT 13*  ALKPHOS 94  BILITOT 0.9  PROT 5.5*  ALBUMIN 2.4*   No results for input(s): LIPASE, AMYLASE in the last 168 hours. No results for input(s): AMMONIA in the last 168 hours. CBC: Recent Labs  Lab 10/24/17 1101 10/24/17 1321 10/25/17 0538 10/26/17 0927 10/27/17 0607  WBC 5.8  --  7.3 8.4 6.8  NEUTROABS 4.5  --   --   --   --   HGB 11.2* 11.6* 10.3* 8.8* 8.5*  HCT 35.2* 34.0* 32.2* 28.6* 26.9*  MCV 99.2  --  100.0 100.0 100.4*  PLT 215  --  195 195 168   Cardiac Enzymes: No results for input(s): CKTOTAL, CKMB, CKMBINDEX, TROPONINI in the last 168 hours. BNP: Invalid input(s): POCBNP CBG: Recent Labs  Lab 10/26/17 1221 10/26/17 1732 10/26/17 2058 10/27/17 0638 10/27/17 1212  GLUCAP 142* 128* 132* 118* 151*   D-Dimer No results for input(s): DDIMER in the last 72 hours. Hgb A1c No results for input(s): HGBA1C in the last 72 hours. Lipid Profile No results for input(s): CHOL, HDL, LDLCALC, TRIG, CHOLHDL, LDLDIRECT in the last 72 hours. Thyroid function studies No results for input(s): TSH, T4TOTAL, T3FREE, THYROIDAB in the last 72 hours.  Invalid input(s): FREET3 Anemia work up No results for input(s): VITAMINB12, FOLATE, FERRITIN, TIBC, IRON, RETICCTPCT in the last 72 hours. Urinalysis    Component Value Date/Time   COLORURINE STRAW (A) 01/04/2017 1229   APPEARANCEUR CLEAR 01/04/2017 1229   LABSPEC  1.012 01/04/2017 1229   PHURINE 5.0  01/04/2017 1229   GLUCOSEU NEGATIVE 01/04/2017 1229   HGBUR NEGATIVE 01/04/2017 1229   BILIRUBINUR NEGATIVE 01/04/2017 1229   KETONESUR NEGATIVE 01/04/2017 1229   PROTEINUR NEGATIVE 01/04/2017 1229   UROBILINOGEN 0.2 10/22/2014 1052   NITRITE NEGATIVE 01/04/2017 1229   LEUKOCYTESUR NEGATIVE 01/04/2017 1229   Sepsis Labs Invalid input(s): PROCALCITONIN,  WBC,  LACTICIDVEN Microbiology No results found for this or any previous visit (from the past 240 hour(s)).   Time coordinating discharge: 32 minutes  SIGNED:  Chipper Oman, MD  [facility] 10/27/2017, 12:18 PM  Pager please text page via  [email or web address]

## 2017-10-27 NOTE — Progress Notes (Signed)
Physical Therapy Treatment Patient Details Name: [name] [name] MRN: [id number] DOB: 1933-08-17 Today's Date: 10/27/2017    History of Present Illness 84 y.o. male s/p L ankle ORIF 1/09. PMH includes:  CAD, CHF, CKD III, HTN, Dyslipidemia, Obesity, Osteopenia, paroxysmal atrial fibrillation, Dyspnea, Diabetes, Colon resection, back surgery, coronary stent placement.     PT Comments    Session focused on reinforcing WB precautions during transfers. Pt slightly better today but still unable to safely adhere during standing in RW. Max A level for OOB mobility at this time. Patient leaving for SNF later today, feel this is still appropriate to improve functional mobility safely.    Follow Up Recommendations  SNF;Supervision/Assistance - 24 hour     Equipment Recommendations  None recommended by PT    Recommendations for Other Services       Precautions / Restrictions Precautions Precautions: Fall Precaution Comments: hx of falls Restrictions Weight Bearing Restrictions: Yes LLE Weight Bearing: Non weight bearing    Mobility  Bed Mobility Overal bed mobility: Needs Assistance Bed Mobility: Supine to Sit     Supine to sit: Mod assist;+2 for physical assistance     General bed mobility comments: Mod A x2 for bed mobility, to support Trunk and assist LLE over EOB. Patient able to tolerate sitting EOB. Slightly decreased time and effort from patient today.   Transfers Overall transfer level: Needs assistance Equipment used: Rolling walker (2 wheeled) Transfers: Sit to/from [facility] Sit to Stand: Max assist;+2 physical assistance Stand pivot transfers: Max assist;+2 physical assistance       General transfer comment: Max A x2 to power up into RW and keep patient from violating WB precautions. Pt unable to tolerate standing today as long, and quickly sat back down onto bed after 30 seconds inside RW. Max A x2 to assist in stand pivot itno bedside chair, while  keeping foot under cam boot and protecting NWB  Ambulation/Gait             General Gait Details: unable   Stairs            Wheelchair Mobility    Modified Rankin (Stroke Patients Only)       Balance Overall balance assessment: History of Falls                                          Cognition Arousal/Alertness: Awake/alert Behavior During Therapy: WFL for tasks assessed/performed Overall Cognitive Status: Within Functional Limits for tasks assessed                                        Exercises      General Comments        Pertinent Vitals/Pain Pain Assessment: Faces Faces Pain Scale: Hurts even more Pain Location: L ankle Pain Descriptors / Indicators: Operative site guarding;Discomfort;Grimacing Pain Intervention(s): Limited activity within patient's tolerance;Monitored during session    Home Living                      Prior Function            PT Goals (current goals can now be found in the care plan section) Acute Rehab PT Goals Patient Stated Goal: go to rehab  PT Goal Formulation: With patient/family Time  For Goal Achievement: 11/02/17 Potential to Achieve Goals: Good Progress towards PT goals: Progressing toward goals    Frequency    Min 3X/week      PT Plan Current plan remains appropriate    Co-evaluation              AM-PAC PT "6 Clicks" Daily Activity  Outcome Measure  Difficulty turning over in bed (including adjusting bedclothes, sheets and blankets)?: Unable Difficulty moving from lying on back to sitting on the side of the bed? : Unable Difficulty sitting down on and standing up from a chair with arms (e.g., wheelchair, bedside commode, etc,.)?: Unable Help needed moving to and from a bed to chair (including a wheelchair)?: A Lot Help needed walking in hospital room?: Total Help needed climbing 3-5 steps with a railing? : Total 6 Click Score: 7    End of  Session Equipment Utilized During Treatment: Gait belt Activity Tolerance: Patient limited by fatigue Patient left: in chair;with call bell/phone within reach Nurse Communication: Mobility status PT Visit Diagnosis: Unsteadiness on feet (R26.81);Other abnormalities of gait and mobility (R26.89);Muscle weakness (generalized) (M62.81);History of falling (Z91.81);Pain;Difficulty in walking, not elsewhere classified (R26.2) Pain - Right/Left: Left Pain - part of body: Ankle and joints of foot     Time: 1255-1317 PT Time Calculation (min) (ACUTE ONLY): 22 min  Charges:  $Therapeutic Activity: 8-22 mins                    G Codes:      Reinaldo Berber, PT, DPT Acute Rehab Services Pager: [phone]     Reinaldo Berber 10/27/2017, 1:25 PM

## 2017-10-27 NOTE — Clinical Social Work Placement (Signed)
   CLINICAL SOCIAL WORK PLACEMENT  NOTE  Date:  10/27/2017  Patient Details  Name: [name] [name] MRN: [id number] Date of Birth: 1933/05/26  Clinical Social Work is seeking post-discharge placement for this patient at the [facility] level of care (*CSW will initial, date and re-position this form in  chart as items are completed):  Yes   Patient/family provided with [facility] Work Department's list of facilities offering this level of care within the geographic area requested by the patient (or if unable, by the patient's family).  Yes   Patient/family informed of their freedom to choose among providers that offer the needed level of care, that participate in Medicare, Medicaid or managed care program needed by the patient, have an available bed and are willing to accept the patient.  Yes   Patient/family informed of [facility]'s ownership interest in [facility] and [facility], as well as of the fact that they are under no obligation to receive care at these facilities.  PASRR submitted to EDS on       PASRR number received on       Existing PASRR number confirmed on 10/26/17     FL2 transmitted to all facilities in geographic area requested by pt/family on       FL2 transmitted to all facilities within larger geographic area on 10/26/17     Patient informed that his/her managed care company has contracts with or will negotiate with certain facilities, including the following:        Yes   Patient/family informed of bed offers received.  Patient chooses bed at [facility], [place]     Physician recommends and patient chooses bed at      Patient to be transferred to [facility] on 10/27/17.  Patient to be transferred to facility by [facility]     Patient family notified on 10/27/17 of transfer.  Name of family member notified:  pt responsible for self     PHYSICIAN       Additional Comment:     _______________________________________________ Normajean Baxter, LCSW 10/27/2017, 1:03 PM

## 2017-10-28 DIAGNOSIS — S82851A Displaced trimalleolar fracture of right lower leg, initial encounter for closed fracture: Secondary | ICD-10-CM | POA: Diagnosis not present

## 2017-10-28 DIAGNOSIS — E1121 Type 2 diabetes mellitus with diabetic nephropathy: Secondary | ICD-10-CM | POA: Diagnosis not present

## 2017-10-28 DIAGNOSIS — R2689 Other abnormalities of gait and mobility: Secondary | ICD-10-CM | POA: Diagnosis not present

## 2017-10-28 DIAGNOSIS — I4892 Unspecified atrial flutter: Secondary | ICD-10-CM | POA: Diagnosis not present

## 2017-10-28 DIAGNOSIS — I2510 Atherosclerotic heart disease of native coronary artery without angina pectoris: Secondary | ICD-10-CM | POA: Diagnosis not present

## 2017-10-30 ENCOUNTER — Telehealth (INDEPENDENT_AMBULATORY_CARE_PROVIDER_SITE_OTHER): Payer: Self-pay | Admitting: Orthopedic Surgery

## 2017-10-30 NOTE — Telephone Encounter (Signed)
Larkin Ina called from [facility] asking about the treatment order for the patient. If you could give them a call back and ask for either Olen Cordial or Claiborne Billings [phone]

## 2017-10-30 NOTE — Telephone Encounter (Signed)
I called and spoke with Claiborne Billings to advise per discharge instructions by MD Dressing care/ Wound VAC: Continue with the compression wrap for 1 week.

## 2017-10-31 DIAGNOSIS — S31000A Unspecified open wound of lower back and pelvis without penetration into retroperitoneum, initial encounter: Secondary | ICD-10-CM | POA: Diagnosis not present

## 2017-11-02 ENCOUNTER — Ambulatory Visit (INDEPENDENT_AMBULATORY_CARE_PROVIDER_SITE_OTHER): Payer: Medicare Other | Admitting: Orthopedic Surgery

## 2017-11-02 ENCOUNTER — Encounter (INDEPENDENT_AMBULATORY_CARE_PROVIDER_SITE_OTHER): Payer: Self-pay | Admitting: Orthopedic Surgery

## 2017-11-02 DIAGNOSIS — S82852A Displaced trimalleolar fracture of left lower leg, initial encounter for closed fracture: Secondary | ICD-10-CM

## 2017-11-02 NOTE — Progress Notes (Signed)
Post-Op Visit Note   Patient: [name] [name]           Date of Birth: Feb 26, 1933           MRN: [id number] Visit Date: 11/02/2017 PCP: [name], MD  Chief Complaint:  Chief Complaint  Patient presents with  . Left Ankle - Routine Post Op    10/25/17 ORIF Left Ankle     HPI:  HPI The patient is a 84 year old gentleman seen 1 week status post ORIF trimalleolar ankle fracture.  Ortho Exam Incisions are well approximated with sutures. Swelling and ecchymosis to lower extremity. No erythema, drainage or sign of infection.  Visit Diagnoses:  1. Trimalleolar fracture of ankle, closed, left, initial encounter     Plan: follow up in office in 1 week for radiographs and eval for suture removal. Begin daily dial soap cleansing. Apply dry dressings. Elevate for swelling. No weight bearing on left.   Follow-Up Instructions: Return in about 1 week (around 11/09/2017).   Imaging: No results found.  Orders:  No orders of the defined types were placed in this encounter.  No orders of the defined types were placed in this encounter.    PMFS History: Patient Active Problem List   Diagnosis Date Noted  . Trimalleolar fracture of ankle, closed, left, initial encounter   . Fall 10/24/2017  . Malleolar fracture, left, closed, initial encounter 10/24/2017  . Fibula fracture 10/24/2017  . Diabetes mellitus with diabetic nephropathy without long-term current use of insulin ([place]) 01/05/2017  . CKD (chronic kidney disease), stage III ([place])   . Chronic combined systolic and diastolic CHF (congestive heart failure) ([place])   . Paroxysmal atrial fibrillation ([name])   . Bleeding gastrointestinal   . Dyslipidemia 10/14/2014  . CAD in native artery- total LAD, s/p RI DES 06/19/14 07/11/2014  . Ulcerative colitis ([place])    Past Medical History:  Diagnosis Date  . Anemia   . Atrial flutter ([place])    a. s/p RFCA of counterclockwise cavo-tricuspid isthmus dependent atrial flutter 2009 by Dr.  Rayann Heman.  Marland Kitchen CAD (coronary artery disease) 06/2014   a. 06/2014: abnl nuc. Cath: Totally occluded LAD with faint collaterals (not a candidate for CTO PCI), 90% ramus s/p DES, 50-70% OM2.  . Chronic diastolic CHF (congestive heart failure) ([place])    a. 2D Echo 10/23/13: EF 50-55%, basal mid-inf HK, grade 2 DD, mild MR, mod dilated LA, no sig change from prior.  . CKD (chronic kidney disease), stage III ([place])   . Complication of anesthesia    " during my kidney stone surgery my heart went out of rhythm  . Compression fracture of L1 lumbar vertebra ([name])   . Diabetes ([place])    TYPE 2   . Dyslipidemia   . Dyspnea   . Frequent PVCs   . GERD (gastroesophageal reflux disease)   . History of blood transfusion   . History of peptic ulcer disease   . HTN (hypertension)   . Hx of acute renal failure 01/02/10-3/24-11   due to hypovolemic shock, gastroenteritis and dehydration,hospitalized . Did requre a few days of dialysis. Cr at discharge was 1.8  . Kidney disease   . Kidney stone may 2009   Right hydronephrosis, S/P stone removal  . Lower back pain   . Macular degeneration   . OA (osteoarthritis) of hip   . Obesity   . Osteopenia   . PAF (paroxysmal atrial fibrillation) ([name])    not on long term  anticoagulation due to history of heme positive stools and anemia  . Shingles    episode  . Small bowel obstruction, partial ([place]) 2009   Episode  . Ulcerative colitis ([place])    a. s/p total colectomy remotely.    Family History  Problem Relation Age of Onset  . Colon cancer Mother     Past Surgical History:  Procedure Laterality Date  . BACK SURGERY    . CARDIAC CATHETERIZATION  06/19/2014  . COLON RESECTION    . CORONARY ANGIOPLASTY    . CORONARY STENT PLACEMENT  06/19/2014   DES       dr Martinique  . HERNIA REPAIR    . LEFT AND RIGHT HEART CATHETERIZATION WITH CORONARY ANGIOGRAM N/A 06/19/2014   Procedure: LEFT AND RIGHT HEART CATHETERIZATION WITH CORONARY ANGIOGRAM;  Surgeon: Peter M Martinique,  MD;  Location: [facility] CATH LAB;  Service: Cardiovascular;  Laterality: N/A;  . ORIF ANKLE FRACTURE Left 10/25/2017   Procedure: OPEN REDUCTION INTERNAL FIXATION (ORIF) LEFT ANKLE FRACTURE;  Surgeon: Newt Minion, MD;  Location: [place];  Service: Orthopedics;  Laterality: Left;  . TONSILLECTOMY     Social History   Occupational History  . Not on file  Tobacco Use  . Smoking status: Never Smoker  . Smokeless tobacco: Never Used  Substance and Sexual Activity  . Alcohol use: No  . Drug use: No  . Sexual activity: Not on file

## 2017-11-09 ENCOUNTER — Ambulatory Visit (INDEPENDENT_AMBULATORY_CARE_PROVIDER_SITE_OTHER): Payer: Medicare Other | Admitting: Orthopedic Surgery

## 2017-11-09 ENCOUNTER — Ambulatory Visit (INDEPENDENT_AMBULATORY_CARE_PROVIDER_SITE_OTHER): Payer: Medicare Other

## 2017-11-09 ENCOUNTER — Encounter (INDEPENDENT_AMBULATORY_CARE_PROVIDER_SITE_OTHER): Payer: Self-pay | Admitting: Orthopedic Surgery

## 2017-11-09 DIAGNOSIS — S82852D Displaced trimalleolar fracture of left lower leg, subsequent encounter for closed fracture with routine healing: Secondary | ICD-10-CM

## 2017-11-11 DIAGNOSIS — S8292XD Unspecified fracture of left lower leg, subsequent encounter for closed fracture with routine healing: Secondary | ICD-10-CM | POA: Diagnosis not present

## 2017-11-11 DIAGNOSIS — E119 Type 2 diabetes mellitus without complications: Secondary | ICD-10-CM | POA: Diagnosis not present

## 2017-11-11 DIAGNOSIS — F3289 Other specified depressive episodes: Secondary | ICD-10-CM | POA: Diagnosis not present

## 2017-11-17 DIAGNOSIS — T7840XA Allergy, unspecified, initial encounter: Secondary | ICD-10-CM

## 2017-11-17 HISTORY — DX: Allergy, unspecified, initial encounter: T78.40XA

## 2017-11-23 ENCOUNTER — Ambulatory Visit (INDEPENDENT_AMBULATORY_CARE_PROVIDER_SITE_OTHER): Payer: Medicare Other | Admitting: Orthopedic Surgery

## 2017-11-23 ENCOUNTER — Encounter (INDEPENDENT_AMBULATORY_CARE_PROVIDER_SITE_OTHER): Payer: Self-pay | Admitting: Family

## 2017-11-23 ENCOUNTER — Ambulatory Visit (INDEPENDENT_AMBULATORY_CARE_PROVIDER_SITE_OTHER): Payer: Medicare Other

## 2017-11-23 ENCOUNTER — Ambulatory Visit (INDEPENDENT_AMBULATORY_CARE_PROVIDER_SITE_OTHER): Payer: Medicare Other | Admitting: Family

## 2017-11-23 VITALS — Ht 68.0 in | Wt 236.0 lb

## 2017-11-23 DIAGNOSIS — M25572 Pain in left ankle and joints of left foot: Secondary | ICD-10-CM

## 2017-11-23 DIAGNOSIS — S82852G Displaced trimalleolar fracture of left lower leg, subsequent encounter for closed fracture with delayed healing: Secondary | ICD-10-CM

## 2017-11-23 NOTE — Progress Notes (Signed)
Post-Op Visit Note   Patient: [name] [name]           Date of Birth: August 01, 1933           MRN: [id number] Visit Date: 11/23/2017 PCP: [name], MD  Chief Complaint:  Chief Complaint  Patient presents with  . Left Ankle - Routine Post Op    10/24/17 ORIF ankle fracture     HPI:  HPI The patient is a 84 year old gentleman seen status post ORIF trimalleolar ankle fracture.  Today is concerned for his weightbearing status as he is residing at skilled nursing as he is not able to currently progress with therapy he is having to pay out of pocket for his rehabilitation.  Unfortunately has had backing out of the screws from the lateral plate.  Is nonweightbearing in a wheelchair.  His sister accompanies his visit.  Ortho Exam Incisions are well healed. There is no erythema, minimal swelling. no drainage or sign of infection.  Visit Diagnoses:  1. Pain in left ankle and joints of left foot     Plan: follow up in office in 3 week for radiographs. Elevate for swelling. No weight bearing on left.  Did provide an order for physical therapy to continue working with patient on upper extremity strengthening may work on transfer training.  Follow-Up Instructions: No Follow-up on file.   Imaging: No results found.  Orders:  Orders Placed This Encounter  Procedures  . XR Ankle Complete Left   No orders of the defined types were placed in this encounter.    PMFS History: Patient Active Problem List   Diagnosis Date Noted  . Trimalleolar fracture of ankle, closed, left, initial encounter   . Fall 10/24/2017  . Malleolar fracture, left, closed, initial encounter 10/24/2017  . Fibula fracture 10/24/2017  . Diabetes mellitus with diabetic nephropathy without long-term current use of insulin ([place]) 01/05/2017  . CKD (chronic kidney disease), stage III ([place])   . Chronic combined systolic and diastolic CHF (congestive heart failure) ([place])   . Paroxysmal atrial fibrillation ([name])   .  Bleeding gastrointestinal   . Dyslipidemia 10/14/2014  . CAD in native artery- total LAD, s/p RI DES 06/19/14 07/11/2014  . Ulcerative colitis ([place])    Past Medical History:  Diagnosis Date  . Anemia   . Atrial flutter ([place])    a. s/p RFCA of counterclockwise cavo-tricuspid isthmus dependent atrial flutter 2009 by Dr. Rayann Heman.  Marland Kitchen CAD (coronary artery disease) 06/2014   a. 06/2014: abnl nuc. Cath: Totally occluded LAD with faint collaterals (not a candidate for CTO PCI), 90% ramus s/p DES, 50-70% OM2.  . Chronic diastolic CHF (congestive heart failure) ([place])    a. 2D Echo 10/23/13: EF 50-55%, basal mid-inf HK, grade 2 DD, mild MR, mod dilated LA, no sig change from prior.  . CKD (chronic kidney disease), stage III ([place])   . Complication of anesthesia    " during my kidney stone surgery my heart went out of rhythm  . Compression fracture of L1 lumbar vertebra ([name])   . Diabetes ([place])    TYPE 2   . Dyslipidemia   . Dyspnea   . Frequent PVCs   . GERD (gastroesophageal reflux disease)   . History of blood transfusion   . History of peptic ulcer disease   . HTN (hypertension)   . Hx of acute renal failure 01/02/10-3/24-11   due to hypovolemic shock, gastroenteritis and dehydration,hospitalized . Did requre a few days of  dialysis. Cr at discharge was 1.8  . Kidney disease   . Kidney stone may 2009   Right hydronephrosis, S/P stone removal  . Lower back pain   . Macular degeneration   . OA (osteoarthritis) of hip   . Obesity   . Osteopenia   . PAF (paroxysmal atrial fibrillation) ([name])    not on long term anticoagulation due to history of heme positive stools and anemia  . Shingles    episode  . Small bowel obstruction, partial ([place]) 2009   Episode  . Ulcerative colitis ([place])    a. s/p total colectomy remotely.    Family History  Problem Relation Age of Onset  . Colon cancer Mother     Past Surgical History:  Procedure Laterality Date  . BACK SURGERY    . CARDIAC CATHETERIZATION   06/19/2014  . COLON RESECTION    . CORONARY ANGIOPLASTY    . CORONARY STENT PLACEMENT  06/19/2014   DES       dr Martinique  . HERNIA REPAIR    . LEFT AND RIGHT HEART CATHETERIZATION WITH CORONARY ANGIOGRAM N/A 06/19/2014   Procedure: LEFT AND RIGHT HEART CATHETERIZATION WITH CORONARY ANGIOGRAM;  Surgeon: Peter M Martinique, MD;  Location: [facility] CATH LAB;  Service: Cardiovascular;  Laterality: N/A;  . ORIF ANKLE FRACTURE Left 10/25/2017   Procedure: OPEN REDUCTION INTERNAL FIXATION (ORIF) LEFT ANKLE FRACTURE;  Surgeon: Newt Minion, MD;  Location: [place];  Service: Orthopedics;  Laterality: Left;  . TONSILLECTOMY     Social History   Occupational History  . Not on file  Tobacco Use  . Smoking status: Never Smoker  . Smokeless tobacco: Never Used  Substance and Sexual Activity  . Alcohol use: No  . Drug use: No  . Sexual activity: Not on file

## 2017-11-27 DIAGNOSIS — Z4889 Encounter for other specified surgical aftercare: Secondary | ICD-10-CM | POA: Diagnosis not present

## 2017-11-27 DIAGNOSIS — R2681 Unsteadiness on feet: Secondary | ICD-10-CM | POA: Diagnosis not present

## 2017-11-27 DIAGNOSIS — G629 Polyneuropathy, unspecified: Secondary | ICD-10-CM | POA: Diagnosis not present

## 2017-11-27 DIAGNOSIS — M6389 Disorders of muscle in diseases classified elsewhere, multiple sites: Secondary | ICD-10-CM | POA: Diagnosis not present

## 2017-11-27 DIAGNOSIS — I5043 Acute on chronic combined systolic (congestive) and diastolic (congestive) heart failure: Secondary | ICD-10-CM | POA: Diagnosis not present

## 2017-11-27 DIAGNOSIS — R293 Abnormal posture: Secondary | ICD-10-CM | POA: Diagnosis not present

## 2017-11-28 DIAGNOSIS — G629 Polyneuropathy, unspecified: Secondary | ICD-10-CM | POA: Diagnosis not present

## 2017-11-28 DIAGNOSIS — Z4889 Encounter for other specified surgical aftercare: Secondary | ICD-10-CM | POA: Diagnosis not present

## 2017-11-28 DIAGNOSIS — R2681 Unsteadiness on feet: Secondary | ICD-10-CM | POA: Diagnosis not present

## 2017-11-28 DIAGNOSIS — I5043 Acute on chronic combined systolic (congestive) and diastolic (congestive) heart failure: Secondary | ICD-10-CM | POA: Diagnosis not present

## 2017-11-28 DIAGNOSIS — R293 Abnormal posture: Secondary | ICD-10-CM | POA: Diagnosis not present

## 2017-11-28 DIAGNOSIS — M6389 Disorders of muscle in diseases classified elsewhere, multiple sites: Secondary | ICD-10-CM | POA: Diagnosis not present

## 2017-11-29 DIAGNOSIS — I5043 Acute on chronic combined systolic (congestive) and diastolic (congestive) heart failure: Secondary | ICD-10-CM | POA: Diagnosis not present

## 2017-11-29 DIAGNOSIS — R293 Abnormal posture: Secondary | ICD-10-CM | POA: Diagnosis not present

## 2017-11-29 DIAGNOSIS — R2681 Unsteadiness on feet: Secondary | ICD-10-CM | POA: Diagnosis not present

## 2017-11-29 DIAGNOSIS — Z4889 Encounter for other specified surgical aftercare: Secondary | ICD-10-CM | POA: Diagnosis not present

## 2017-11-29 DIAGNOSIS — G629 Polyneuropathy, unspecified: Secondary | ICD-10-CM | POA: Diagnosis not present

## 2017-11-29 DIAGNOSIS — M6389 Disorders of muscle in diseases classified elsewhere, multiple sites: Secondary | ICD-10-CM | POA: Diagnosis not present

## 2017-11-30 ENCOUNTER — Ambulatory Visit (INDEPENDENT_AMBULATORY_CARE_PROVIDER_SITE_OTHER): Payer: Medicare Other | Admitting: Orthopedic Surgery

## 2017-11-30 ENCOUNTER — Other Ambulatory Visit (INDEPENDENT_AMBULATORY_CARE_PROVIDER_SITE_OTHER): Payer: Self-pay | Admitting: Family

## 2017-11-30 ENCOUNTER — Encounter (HOSPITAL_COMMUNITY): Payer: Self-pay | Admitting: *Deleted

## 2017-11-30 ENCOUNTER — Ambulatory Visit (INDEPENDENT_AMBULATORY_CARE_PROVIDER_SITE_OTHER): Payer: Medicare Other

## 2017-11-30 ENCOUNTER — Encounter (INDEPENDENT_AMBULATORY_CARE_PROVIDER_SITE_OTHER): Payer: Self-pay | Admitting: Family

## 2017-11-30 VITALS — Ht 68.0 in | Wt 236.0 lb

## 2017-11-30 DIAGNOSIS — M25572 Pain in left ankle and joints of left foot: Secondary | ICD-10-CM | POA: Diagnosis not present

## 2017-11-30 DIAGNOSIS — T847XXA Infection and inflammatory reaction due to other internal orthopedic prosthetic devices, implants and grafts, initial encounter: Secondary | ICD-10-CM

## 2017-11-30 MED ORDER — DOXYCYCLINE HYCLATE 100 MG PO TABS: 100.0000 mg | ORAL_TABLET | Freq: Two times a day (BID) | ORAL | 0 refills | Status: DC

## 2017-11-30 NOTE — Progress Notes (Signed)
Office Visit Note   Patient: [name] [name]           Date of Birth: 04/08/1933           MRN: [id number] Visit Date: 11/30/2017              Requested by: [name], MD [address] [place], [place] [address] PCP: [name], MD  Chief Complaint  Patient presents with  . Left Ankle - Routine Post Op    10/24/17 left ORIF ankle fx      HPI: Patient presents 5 weeks status post open reduction internal fixation comminuted bimalleolar ankle fracture.  Patient currently exhibits collapse skilled nursing facility.  Patient has been having a regular cotton sock applied to the open wound.  Patient presents at this time for wound dehiscence with exposed hardware.  Assessment & Plan: Visit Diagnoses:  1. Pain in left ankle and joints of left foot   2. Hardware complicating wound infection, initial encounter [facility])     Plan: Due to the exposed hardware wound dehiscence we will need to remove the hardware.  The fracture site is not completely stable but we cannot leave the hardware in place.  We will plan for removal of the deep retained hardware tomorrow placement of an installation wound VAC.  We will need to remove the hardware medially and laterally.  Patient will need immobilization in a cast postoperatively.  Most likely will build to proceed with this once we remove the wound VAC.  Patient is given a prescription for doxycycline to take 100 mg twice a day for a month.  Follow-Up Instructions: Return in about 2 weeks (around 12/14/2017).   Ortho Exam  Patient is alert, oriented, no adenopathy, well-dressed, normal affect, normal respiratory effort. Examination there is dehiscence of the wound distally the wound is gaped open a centimeter by 5 mm and hardware is visible distally.  Radiographs shows loss of reduction with failure of the internal fixation due to osteoporosis with prominent screws medially and laterally.  Patient has a faintly palpable pulse with  calcification of the arteries down to the foot and ankle.  Loss of reduction in wound dehiscence secondary to his peripheral vascular disease osteoporosis and diabetes.  Imaging: No results found. No images are attached to the encounter.  Labs: Lab Results  Component Value Date   HGBA1C 6.5 (H) 01/04/2017   HGBA1C 6.8 (H) 01/15/2016   HGBA1C 6.7 (H) 10/23/2014   REPTSTATUS 10/31/2014 FINAL 10/24/2014   CULT  10/24/2014    NO GROWTH 5 DAYS Performed at [facility]     @LABSALLVALUES (HGBA1)@  Body mass index is 35.88 kg/m.  Orders:  Orders Placed This Encounter  Procedures  . XR Ankle Complete Left   Meds ordered this encounter  Medications  . doxycycline (VIBRA-TABS) 100 MG tablet    Sig: Take 1 tablet (100 mg total) by mouth 2 (two) times daily.    Dispense:  60 tablet    Refill:  0     Procedures: No procedures performed  Clinical Data: No additional findings.  ROS:  All other systems negative, except as noted in the HPI. Review of Systems  Objective: Vital Signs: Ht 5' 8"  (1.727 m)   Wt 236 lb (107 kg)   BMI 35.88 kg/m   Specialty Comments:  No specialty comments available.  PMFS History: Patient Active Problem List   Diagnosis Date Noted  . Hardware complicating wound infection ([place]) 11/30/2017  .  Trimalleolar fracture of ankle, closed, left, initial encounter   . Fall 10/24/2017  . Malleolar fracture, left, closed, initial encounter 10/24/2017  . Fibula fracture 10/24/2017  . Diabetes mellitus with diabetic nephropathy without long-term current use of insulin ([place]) 01/05/2017  . CKD (chronic kidney disease), stage III ([place])   . Chronic combined systolic and diastolic CHF (congestive heart failure) ([place])   . Paroxysmal atrial fibrillation ([name])   . Bleeding gastrointestinal   . Dyslipidemia 10/14/2014  . CAD in native artery- total LAD, s/p RI DES 06/19/14 07/11/2014  . Ulcerative colitis ([place])    Past Medical History:  Diagnosis  Date  . Anemia   . Atrial flutter ([place])    a. s/p RFCA of counterclockwise cavo-tricuspid isthmus dependent atrial flutter 2009 by Dr. Rayann Heman.  Marland Kitchen CAD (coronary artery disease) 06/2014   a. 06/2014: abnl nuc. Cath: Totally occluded LAD with faint collaterals (not a candidate for CTO PCI), 90% ramus s/p DES, 50-70% OM2.  . Chronic diastolic CHF (congestive heart failure) ([place])    a. 2D Echo 10/23/13: EF 50-55%, basal mid-inf HK, grade 2 DD, mild MR, mod dilated LA, no sig change from prior.  . CKD (chronic kidney disease), stage III ([place])   . Complication of anesthesia    " during my kidney stone surgery my heart went out of rhythm  . Compression fracture of L1 lumbar vertebra ([name])   . Diabetes ([place])    TYPE 2   . Dyslipidemia   . Dyspnea   . Frequent PVCs   . GERD (gastroesophageal reflux disease)   . History of blood transfusion   . History of peptic ulcer disease   . HTN (hypertension)   . Hx of acute renal failure 01/02/10-3/24-11   due to hypovolemic shock, gastroenteritis and dehydration,hospitalized . Did requre a few days of dialysis. Cr at discharge was 1.8  . Kidney disease   . Kidney stone may 2009   Right hydronephrosis, S/P stone removal  . Lower back pain   . Macular degeneration   . OA (osteoarthritis) of hip   . Obesity   . Osteopenia   . PAF (paroxysmal atrial fibrillation) ([name])    not on long term anticoagulation due to history of heme positive stools and anemia  . Shingles    episode  . Small bowel obstruction, partial ([place]) 2009   Episode  . Ulcerative colitis ([place])    a. s/p total colectomy remotely.    Family History  Problem Relation Age of Onset  . Colon cancer Mother     Past Surgical History:  Procedure Laterality Date  . BACK SURGERY    . CARDIAC CATHETERIZATION  06/19/2014  . COLON RESECTION    . CORONARY ANGIOPLASTY    . CORONARY STENT PLACEMENT  06/19/2014   DES       dr Martinique  . HERNIA REPAIR    . LEFT AND RIGHT HEART CATHETERIZATION  WITH CORONARY ANGIOGRAM N/A 06/19/2014   Procedure: LEFT AND RIGHT HEART CATHETERIZATION WITH CORONARY ANGIOGRAM;  Surgeon: Peter M Martinique, MD;  Location: [facility] CATH LAB;  Service: Cardiovascular;  Laterality: N/A;  . ORIF ANKLE FRACTURE Left 10/25/2017   Procedure: OPEN REDUCTION INTERNAL FIXATION (ORIF) LEFT ANKLE FRACTURE;  Surgeon: Newt Minion, MD;  Location: [place];  Service: Orthopedics;  Laterality: Left;  . TONSILLECTOMY     Social History   Occupational History  . Not on file  Tobacco Use  . Smoking status: Never Smoker  . Smokeless  tobacco: Never Used  Substance and Sexual Activity  . Alcohol use: No  . Drug use: No  . Sexual activity: Not on file

## 2017-11-30 NOTE — Progress Notes (Signed)
Pt is at [facility] after a fractured ankle. Spoke with Estill Bamberg, RN at [facility]. She verified allergies and medical history. She states pt is alert, oriented and able to speak for himself. Pt is diabetic, type 2. Last A1C was 6.3 on 10/30/17. She states his fasting blood sugar is usually between 95-140. Instructed Estill Bamberg that pt is not to take his Actos or Onglyza in the AM. Instructed her to have pt's blood sugar checked in the AM when he gets up and every 2 hours until he leaves for the hospital. If blood sugar is 70 or below, treat with 1/2 cup of clear juice (apple or cranberry) and recheck blood sugar 15 minutes after drinking juice.  Pt will be arriving either by his sister or by [facility] transportation.

## 2017-12-01 ENCOUNTER — Encounter (HOSPITAL_COMMUNITY): Payer: Self-pay | Admitting: General Practice

## 2017-12-01 ENCOUNTER — Ambulatory Visit (HOSPITAL_COMMUNITY): Payer: Medicare Other | Admitting: Anesthesiology

## 2017-12-01 ENCOUNTER — Encounter (HOSPITAL_COMMUNITY)
Admission: RE | Disposition: A | Discharge status: Home / Self Care | Payer: Self-pay | Source: Ambulatory Visit | Attending: Orthopedic Surgery

## 2017-12-01 ENCOUNTER — Ambulatory Visit (HOSPITAL_COMMUNITY)
Admission: RE | Admit: 2017-12-01 | Discharge: 2017-12-01 | Disposition: A | Discharge status: Home / Self Care | Payer: Medicare Other | Source: Ambulatory Visit | Attending: Orthopedic Surgery | Admitting: Orthopedic Surgery

## 2017-12-01 ENCOUNTER — Other Ambulatory Visit: Payer: Self-pay

## 2017-12-01 DIAGNOSIS — E785 Hyperlipidemia, unspecified: Secondary | ICD-10-CM | POA: Insufficient documentation

## 2017-12-01 DIAGNOSIS — Y939 Activity, unspecified: Secondary | ICD-10-CM | POA: Diagnosis not present

## 2017-12-01 DIAGNOSIS — Z882 Allergy status to sulfonamides status: Secondary | ICD-10-CM | POA: Diagnosis not present

## 2017-12-01 DIAGNOSIS — E1122 Type 2 diabetes mellitus with diabetic chronic kidney disease: Secondary | ICD-10-CM | POA: Diagnosis not present

## 2017-12-01 DIAGNOSIS — N183 Chronic kidney disease, stage 3 (moderate): Secondary | ICD-10-CM | POA: Diagnosis not present

## 2017-12-01 DIAGNOSIS — M8580 Other specified disorders of bone density and structure, unspecified site: Secondary | ICD-10-CM | POA: Insufficient documentation

## 2017-12-01 DIAGNOSIS — Z880 Allergy status to penicillin: Secondary | ICD-10-CM | POA: Diagnosis not present

## 2017-12-01 DIAGNOSIS — T8132XA Disruption of internal operation (surgical) wound, not elsewhere classified, initial encounter: Secondary | ICD-10-CM | POA: Insufficient documentation

## 2017-12-01 DIAGNOSIS — M1610 Unilateral primary osteoarthritis, unspecified hip: Secondary | ICD-10-CM | POA: Diagnosis not present

## 2017-12-01 DIAGNOSIS — I5032 Chronic diastolic (congestive) heart failure: Secondary | ICD-10-CM | POA: Insufficient documentation

## 2017-12-01 DIAGNOSIS — K219 Gastro-esophageal reflux disease without esophagitis: Secondary | ICD-10-CM | POA: Diagnosis not present

## 2017-12-01 DIAGNOSIS — I480 Paroxysmal atrial fibrillation: Secondary | ICD-10-CM | POA: Insufficient documentation

## 2017-12-01 DIAGNOSIS — Z955 Presence of coronary angioplasty implant and graft: Secondary | ICD-10-CM | POA: Insufficient documentation

## 2017-12-01 DIAGNOSIS — Z933 Colostomy status: Secondary | ICD-10-CM | POA: Insufficient documentation

## 2017-12-01 DIAGNOSIS — Z888 Allergy status to other drugs, medicaments and biological substances status: Secondary | ICD-10-CM | POA: Insufficient documentation

## 2017-12-01 DIAGNOSIS — Z800 Family history of malignant neoplasm of digestive organs: Secondary | ICD-10-CM | POA: Insufficient documentation

## 2017-12-01 DIAGNOSIS — T847XXS Infection and inflammatory reaction due to other internal orthopedic prosthetic devices, implants and grafts, sequela: Secondary | ICD-10-CM

## 2017-12-01 DIAGNOSIS — I2510 Atherosclerotic heart disease of native coronary artery without angina pectoris: Secondary | ICD-10-CM | POA: Insufficient documentation

## 2017-12-01 DIAGNOSIS — X58XXXA Exposure to other specified factors, initial encounter: Secondary | ICD-10-CM | POA: Diagnosis not present

## 2017-12-01 DIAGNOSIS — I4892 Unspecified atrial flutter: Secondary | ICD-10-CM | POA: Insufficient documentation

## 2017-12-01 DIAGNOSIS — Z87442 Personal history of urinary calculi: Secondary | ICD-10-CM | POA: Insufficient documentation

## 2017-12-01 DIAGNOSIS — H3530 Unspecified macular degeneration: Secondary | ICD-10-CM | POA: Insufficient documentation

## 2017-12-01 DIAGNOSIS — T8459XA Infection and inflammatory reaction due to other internal joint prosthesis, initial encounter: Secondary | ICD-10-CM | POA: Diagnosis not present

## 2017-12-01 DIAGNOSIS — M545 Low back pain: Secondary | ICD-10-CM | POA: Insufficient documentation

## 2017-12-01 DIAGNOSIS — I130 Hypertensive heart and chronic kidney disease with heart failure and stage 1 through stage 4 chronic kidney disease, or unspecified chronic kidney disease: Secondary | ICD-10-CM | POA: Insufficient documentation

## 2017-12-01 DIAGNOSIS — I493 Ventricular premature depolarization: Secondary | ICD-10-CM | POA: Insufficient documentation

## 2017-12-01 DIAGNOSIS — Z8711 Personal history of peptic ulcer disease: Secondary | ICD-10-CM | POA: Insufficient documentation

## 2017-12-01 DIAGNOSIS — Z9049 Acquired absence of other specified parts of digestive tract: Secondary | ICD-10-CM | POA: Diagnosis not present

## 2017-12-01 DIAGNOSIS — R0600 Dyspnea, unspecified: Secondary | ICD-10-CM | POA: Insufficient documentation

## 2017-12-01 HISTORY — PX: HARDWARE REMOVAL: SHX979

## 2017-12-01 HISTORY — DX: Personal history of urinary calculi: Z87.442

## 2017-12-01 HISTORY — DX: Colostomy status: Z93.3

## 2017-12-01 LAB — BASIC METABOLIC PANEL
Anion gap: 10 (ref 5–15)
BUN: 43 mg/dL — ABNORMAL HIGH (ref 6–20)
CO2: 19 mmol/L — ABNORMAL LOW (ref 22–32)
Calcium: 8.3 mg/dL — ABNORMAL LOW (ref 8.9–10.3)
Chloride: 109 mmol/L (ref 101–111)
Creatinine, Ser: 1.38 mg/dL — ABNORMAL HIGH (ref 0.61–1.24)
GFR calc Af Amer: 53 mL/min — ABNORMAL LOW (ref >60)
GFR calc non Af Amer: 46 mL/min — ABNORMAL LOW (ref >60)
Glucose, Bld: 130 mg/dL — ABNORMAL HIGH (ref 65–99)
Potassium: 5.4 mmol/L — ABNORMAL HIGH (ref 3.5–5.1)
Sodium: 138 mmol/L (ref 135–145)

## 2017-12-01 LAB — CBC
HCT: 32.5 % — ABNORMAL LOW (ref 39.0–52.0)
Hemoglobin: 10.4 g/dL — ABNORMAL LOW (ref 13.0–17.0)
MCH: 31 pg (ref 26.0–34.0)
MCHC: 32 g/dL (ref 30.0–36.0)
MCV: 97 fL (ref 78.0–100.0)
Platelets: 250 10*3/uL (ref 150–400)
RBC: 3.35 MIL/uL — ABNORMAL LOW (ref 4.22–5.81)
RDW: 14.8 % (ref 11.5–15.5)
WBC: 7.1 10*3/uL (ref 4.0–10.5)

## 2017-12-01 LAB — GLUCOSE, CAPILLARY
Glucose-Capillary: 125 mg/dL — ABNORMAL HIGH (ref 65–99)
Glucose-Capillary: 114 mg/dL — ABNORMAL HIGH (ref 65–99)

## 2017-12-01 SURGERY — REMOVAL, HARDWARE
Anesthesia: General | Laterality: Left

## 2017-12-01 MED ORDER — PROPOFOL 10 MG/ML IV BOLUS
INTRAVENOUS | Status: AC
  Filled 2017-12-01: qty 20

## 2017-12-01 MED ORDER — PHENYLEPHRINE 40 MCG/ML (10ML) SYRINGE FOR IV PUSH (FOR BLOOD PRESSURE SUPPORT)
PREFILLED_SYRINGE | INTRAVENOUS | Status: DC | PRN
  Administered 2017-12-01 (×5): 80 ug via INTRAVENOUS

## 2017-12-01 MED ORDER — LIDOCAINE 2% (20 MG/ML) 5 ML SYRINGE
INTRAMUSCULAR | Status: DC | PRN
  Administered 2017-12-01: 60 mg via INTRAVENOUS

## 2017-12-01 MED ORDER — PROPOFOL 10 MG/ML IV BOLUS
INTRAVENOUS | Status: DC | PRN
  Administered 2017-12-01: 150 mg via INTRAVENOUS

## 2017-12-01 MED ORDER — CLINDAMYCIN PHOSPHATE 900 MG/50ML IV SOLN
900.0000 mg | INTRAVENOUS | Status: AC
  Administered 2017-12-01: 900 mg via INTRAVENOUS

## 2017-12-01 MED ORDER — EPHEDRINE 5 MG/ML INJ
INTRAVENOUS | Status: AC
  Filled 2017-12-01: qty 10

## 2017-12-01 MED ORDER — CLINDAMYCIN PHOSPHATE 900 MG/50ML IV SOLN
INTRAVENOUS | Status: AC
  Filled 2017-12-01: qty 50

## 2017-12-01 MED ORDER — FENTANYL CITRATE (PF) 100 MCG/2ML IJ SOLN
25.0000 ug | INTRAMUSCULAR | Status: DC | PRN
  Administered 2017-12-01: 25 ug via INTRAVENOUS

## 2017-12-01 MED ORDER — MEPERIDINE HCL 50 MG/ML IJ SOLN: 6.2500 mg | INTRAMUSCULAR | Status: DC | PRN

## 2017-12-01 MED ORDER — DEXAMETHASONE SODIUM PHOSPHATE 10 MG/ML IJ SOLN
INTRAMUSCULAR | Status: DC | PRN
  Administered 2017-12-01: 5 mg via INTRAVENOUS

## 2017-12-01 MED ORDER — FENTANYL CITRATE (PF) 250 MCG/5ML IJ SOLN
INTRAMUSCULAR | Status: AC
  Filled 2017-12-01: qty 5

## 2017-12-01 MED ORDER — FENTANYL CITRATE (PF) 100 MCG/2ML IJ SOLN
INTRAMUSCULAR | Status: AC
  Filled 2017-12-01: qty 2

## 2017-12-01 MED ORDER — LACTATED RINGERS IV SOLN
INTRAVENOUS | Status: DC
  Administered 2017-12-01: 10:00:00 via INTRAVENOUS

## 2017-12-01 MED ORDER — ONDANSETRON HCL 4 MG/2ML IJ SOLN
INTRAMUSCULAR | Status: DC | PRN
  Administered 2017-12-01: 4 mg via INTRAVENOUS

## 2017-12-01 MED ORDER — METOCLOPRAMIDE HCL 5 MG/ML IJ SOLN: 10.0000 mg | Freq: Once | INTRAMUSCULAR | Status: DC | PRN

## 2017-12-01 MED ORDER — EPHEDRINE SULFATE-NACL 50-0.9 MG/10ML-% IV SOSY
PREFILLED_SYRINGE | INTRAVENOUS | Status: DC | PRN
  Administered 2017-12-01: 10 mg via INTRAVENOUS

## 2017-12-01 MED ORDER — HYDROMORPHONE HCL 1 MG/ML IJ SOLN
INTRAMUSCULAR | Status: DC | PRN
  Administered 2017-12-01: 0.5 mg via INTRAVENOUS

## 2017-12-01 MED ORDER — CHLORHEXIDINE GLUCONATE 4 % EX LIQD: 60.0000 mL | Freq: Once | CUTANEOUS | Status: DC

## 2017-12-01 MED ORDER — HYDROMORPHONE HCL 1 MG/ML IJ SOLN
INTRAMUSCULAR | Status: AC
  Filled 2017-12-01: qty 0.5

## 2017-12-01 MED ORDER — LIDOCAINE 2% (20 MG/ML) 5 ML SYRINGE
INTRAMUSCULAR | Status: AC
  Filled 2017-12-01: qty 5

## 2017-12-01 SURGICAL SUPPLY — 51 items
BANDAGE ESMARK 6X9 LF (GAUZE/BANDAGES/DRESSINGS) IMPLANT
BNDG COHESIVE 4X5 TAN STRL (GAUZE/BANDAGES/DRESSINGS) IMPLANT
BNDG ESMARK 6X9 LF (GAUZE/BANDAGES/DRESSINGS)
BNDG GAUZE ELAST 4 BULKY (GAUZE/BANDAGES/DRESSINGS) ×2 IMPLANT
COVER SURGICAL LIGHT HANDLE (MISCELLANEOUS) ×4 IMPLANT
CUFF TOURNIQUET SINGLE 34IN LL (TOURNIQUET CUFF) IMPLANT
CUFF TOURNIQUET SINGLE 44IN (TOURNIQUET CUFF) IMPLANT
DRAPE C-ARM 42X72 X-RAY (DRAPES) IMPLANT
DRAPE INCISE IOBAN 66X45 STRL (DRAPES) IMPLANT
DRAPE INCISE IOBAN 85X60 (DRAPES) ×2 IMPLANT
DRAPE ORTHO SPLIT 77X108 STRL (DRAPES)
DRAPE SURG ORHT 6 SPLT 77X108 (DRAPES) IMPLANT
DRAPE U-SHAPE 47X51 STRL (DRAPES) ×2 IMPLANT
DRESSING PREVENA PLUS CUSTOM (GAUZE/BANDAGES/DRESSINGS) ×1 IMPLANT
DRSG EMULSION OIL 3X3 NADH (GAUZE/BANDAGES/DRESSINGS) ×2 IMPLANT
DRSG PAD ABDOMINAL 8X10 ST (GAUZE/BANDAGES/DRESSINGS) ×2 IMPLANT
DRSG PREVENA PLUS CUSTOM (GAUZE/BANDAGES/DRESSINGS) ×2
DURAPREP 26ML APPLICATOR (WOUND CARE) ×2 IMPLANT
ELECT REM PT RETURN 9FT ADLT (ELECTROSURGICAL) ×2
ELECTRODE REM PT RTRN 9FT ADLT (ELECTROSURGICAL) ×1 IMPLANT
GAUZE SPONGE 4X4 12PLY STRL (GAUZE/BANDAGES/DRESSINGS) ×2 IMPLANT
GLOVE BIO SURGEON STRL SZ7 (GLOVE) ×2 IMPLANT
GLOVE BIOGEL PI IND STRL 7.0 (GLOVE) ×3 IMPLANT
GLOVE BIOGEL PI IND STRL 9 (GLOVE) ×1 IMPLANT
GLOVE BIOGEL PI INDICATOR 7.0 (GLOVE) ×3
GLOVE BIOGEL PI INDICATOR 9 (GLOVE) ×1
GLOVE SURG ORTHO 9.0 STRL STRW (GLOVE) ×2 IMPLANT
GLOVE SURG SS PI 7.0 STRL IVOR (GLOVE) ×6 IMPLANT
GOWN STRL REUS W/ TWL LRG LVL3 (GOWN DISPOSABLE) ×4 IMPLANT
GOWN STRL REUS W/ TWL XL LVL3 (GOWN DISPOSABLE) ×3 IMPLANT
GOWN STRL REUS W/TWL LRG LVL3 (GOWN DISPOSABLE) ×4
GOWN STRL REUS W/TWL XL LVL3 (GOWN DISPOSABLE) ×3
KIT BASIN OR (CUSTOM PROCEDURE TRAY) ×2 IMPLANT
KIT DRSG PREVENA PLUS 7DAY 125 (MISCELLANEOUS) ×2 IMPLANT
KIT ROOM TURNOVER OR (KITS) ×2 IMPLANT
MANIFOLD NEPTUNE II (INSTRUMENTS) ×2 IMPLANT
NS IRRIG 1000ML POUR BTL (IV SOLUTION) ×2 IMPLANT
PACK ORTHO EXTREMITY (CUSTOM PROCEDURE TRAY) ×2 IMPLANT
PAD ARMBOARD 7.5X6 YLW CONV (MISCELLANEOUS) ×4 IMPLANT
SPONGE LAP 18X18 X RAY DECT (DISPOSABLE) IMPLANT
STAPLER VISISTAT 35W (STAPLE) IMPLANT
STOCKINETTE IMPERVIOUS 9X36 MD (GAUZE/BANDAGES/DRESSINGS) IMPLANT
SUT ETHILON 2 0 PSLX (SUTURE) IMPLANT
SUT VIC AB 0 CT1 27 (SUTURE)
SUT VIC AB 0 CT1 27XBRD ANBCTR (SUTURE) IMPLANT
SUT VIC AB 2-0 CT1 27 (SUTURE)
SUT VIC AB 2-0 CT1 TAPERPNT 27 (SUTURE) IMPLANT
TOWEL OR 17X24 6PK STRL BLUE (TOWEL DISPOSABLE) ×2 IMPLANT
TOWEL OR 17X26 10 PK STRL BLUE (TOWEL DISPOSABLE) ×2 IMPLANT
UNDERPAD 30X30 (UNDERPADS AND DIAPERS) ×2 IMPLANT
WATER STERILE IRR 1000ML POUR (IV SOLUTION) ×2 IMPLANT

## 2017-12-01 NOTE — H&P (Signed)
[name] [name] is an 84 y.o. male.   Chief Complaint: Wound dehiscence left ankle status post open reduction internal fixation.   HPI: Patient presents 5 weeks status post open reduction internal fixation comminuted bimalleolar ankle fracture.  Patient currently is cared for at skilled nursing facility.  Patient has been having a regular cotton sock applied to the open wound.  Patient presents at this time for wound dehiscence with exposed hardware.  Past Medical History:  Diagnosis Date  . Anemia   . Atrial flutter ([place])    a. s/p RFCA of counterclockwise cavo-tricuspid isthmus dependent atrial flutter 2009 by Dr. Rayann Heman.  Marland Kitchen CAD (coronary artery disease) 06/2014   a. 06/2014: abnl nuc. Cath: Totally occluded LAD with faint collaterals (not a candidate for CTO PCI), 90% ramus s/p DES, 50-70% OM2.  . Chronic diastolic CHF (congestive heart failure) ([place])    a. 2D Echo 10/23/13: EF 50-55%, basal mid-inf HK, grade 2 DD, mild MR, mod dilated LA, no sig change from prior.  . CKD (chronic kidney disease), stage III ([place])   . Colostomy in place [facility])   . Complication of anesthesia    " during my kidney stone surgery my heart went out of rhythm  . Compression fracture of L1 lumbar vertebra ([name])   . Diabetes ([place])    TYPE 2   . Dyslipidemia   . Dyspnea   . Frequent PVCs   . GERD (gastroesophageal reflux disease)   . History of blood transfusion   . History of kidney stones   . History of peptic ulcer disease   . HTN (hypertension)   . Hx of acute renal failure 01/02/10-3/24-11   due to hypovolemic shock, gastroenteritis and dehydration,hospitalized . Did requre a few days of dialysis. Cr at discharge was 1.8  . Kidney disease   . Kidney stone may 2009   Right hydronephrosis, S/P stone removal  . Lower back pain   . Macular degeneration   . OA (osteoarthritis) of hip   . Obesity   . Osteopenia   . PAF (paroxysmal atrial fibrillation) ([name])    not on long term anticoagulation due to history  of heme positive stools and anemia  . Shingles    episode  . Small bowel obstruction, partial ([place]) 2009   Episode  . Ulcerative colitis ([place])    a. s/p total colectomy remotely.    Past Surgical History:  Procedure Laterality Date  . BACK SURGERY    . CARDIAC CATHETERIZATION  06/19/2014  . COLON RESECTION    . COLOSTOMY    . CORONARY ANGIOPLASTY    . CORONARY STENT PLACEMENT  06/19/2014   DES       dr Martinique  . HERNIA REPAIR    . LEFT AND RIGHT HEART CATHETERIZATION WITH CORONARY ANGIOGRAM N/A 06/19/2014   Procedure: LEFT AND RIGHT HEART CATHETERIZATION WITH CORONARY ANGIOGRAM;  Surgeon: Peter M Martinique, MD;  Location: [facility] CATH LAB;  Service: Cardiovascular;  Laterality: N/A;  . ORIF ANKLE FRACTURE Left 10/25/2017   Procedure: OPEN REDUCTION INTERNAL FIXATION (ORIF) LEFT ANKLE FRACTURE;  Surgeon: Newt Minion, MD;  Location: [place];  Service: Orthopedics;  Laterality: Left;  . TONSILLECTOMY      Family History  Problem Relation Age of Onset  . Colon cancer Mother    Social History:  reports that  has never smoked. he has never used smokeless tobacco. He reports that he does not drink alcohol or use drugs.  Allergies:  Allergies  Allergen Reactions  . Brilinta [Ticagrelor] Shortness Of Breath and Other (See Comments)    Changed to Plavix due to SOB  . Penicillins Rash and Other (See Comments)    Has patient had a PCN reaction causing immediate rash, facial/tongue/throat swelling, SOB or lightheadedness with hypotension:Yes Has patient had a PCN reaction causing severe rash involving mucus membranes or skin necrosis: No Has patient had a PCN reaction that required hospitalization: No Has patient had a PCN reaction occurring within the last 10 years: No If all of the above answers are "NO", then may proceed with Cephalosporin use.  . Sulfa Antibiotics Other (See Comments)    Pt does not remember the reaction, but it sure the allergy exists.    No medications prior to  admission.    No results found for this or any previous visit (from the past 48 hour(s)). Xr Ankle Complete Left  Result Date: 11/30/2017 3 view radiographs of the left ankle shows some callus formation however there has been loss of reduction due to osteoporosis and peripheral vascular disease.   Review of Systems  All other systems reviewed and are negative.   There were no vitals taken for this visit. Physical Exam   Assessment/Plan 1. Pain in left ankle and joints of left foot   2. Hardware complicating wound infection, initial encounter [facility])     Plan: Due to the exposed hardware wound dehiscence we will need to remove the hardware.  The fracture site is not completely stable but we cannot leave the hardware in place.  We will plan for removal of the deep retained hardware  placement of an installation wound VAC.  We will need to remove the hardware medially and laterally.      Newt Minion, MD 12/01/2017, 7:04 AM

## 2017-12-01 NOTE — Anesthesia Procedure Notes (Signed)
Procedure Name: LMA Insertion Performed by: Catalina Gravel, MD Pre-anesthesia Checklist: Patient identified, Emergency Drugs available, Suction available and Patient being monitored Patient Re-evaluated:Patient Re-evaluated prior to induction Oxygen Delivery Method: Circle System Utilized Preoxygenation: Pre-oxygenation with 100% oxygen Induction Type: IV induction Ventilation: Mask ventilation without difficulty LMA: LMA inserted LMA Size: 5.0 Number of attempts: 1 Airway Equipment and Method: Bite block Placement Confirmation: positive ETCO2 Tube secured with: Tape Dental Injury: Teeth and Oropharynx as per pre-operative assessment

## 2017-12-01 NOTE — Op Note (Signed)
12/01/2017  12:44 PM  PATIENT:  [name] DIAGNOSIS:  Wound Dehiscence Left Ankle  POST-OPERATIVE DIAGNOSIS:  Same  PROCEDURE:  HARDWARE REMOVAL LEFT ANKLE  SURGEON:  Newt Minion, MD  PHYSICIAN ASSISTANT:None ANESTHESIA:   General  PREOPERATIVE INDICATIONS:  [name] [name] is a  84 y.o. male with a diagnosis of Wound Dehiscence Left Ankle who failed conservative measures and elected for surgical management.    The risks benefits and alternatives were discussed with the patient preoperatively including but not limited to the risks of infection, bleeding, nerve injury, cardiopulmonary complications, the need for revision surgery, among others, and the patient was willing to proceed.  OPERATIVE IMPLANTS: Praveena wound VAC customizable  OPERATIVE FINDINGS: No deep abscess.  OPERATIVE PROCEDURE: Patient was brought the operating room and underwent a general anesthetic.  After adequate levels of anesthesia were obtained patient's left lower extremity was prepped using DuraPrep a timeout was called.  A elliptical incision was made around the previous incision to ellipse out the ulcerative tissue over the distal aspect of the internal fixation.  The soft tissue was resected in one block of tissue.  The plate and screws were then removed without complications.  The wound was further debrided with a rondure of fibrinous tissue there is no abscess.  The wound was irrigated with normal saline.  Local tissue rearrangement was used to close the wound that was 3 cm x 10 cm.  Attention was focused on the medial incision.  The previous incision was used this was carried down to the cannulated screws.  These were removed without complication the wound was irrigated with normal saline incision closed using 2-0 nylon.  A Praveena wound VAC customizable was then attached to encompass both wounds.  This had a good suction fit there is no drainage.  Patient was extubated taken the PACU in  stable condition.   DISCHARGE PLANNING:  Antibiotic duration: Patient will continue with oral doxycycline and receives 900 mg of clindamycin intraoperatively.  Weightbearing: Nonweightbearing left lower extremity with a fracture boot  Pain medication: Patient has pain medication at skilled nursing  Dressing care/ Wound VAC: Continue wound VAC for 1 week.  Ambulatory devices: Walker for transfers  Discharge to: Return to skilled nursing.  Follow-up: In the office 1 week post operative.

## 2017-12-01 NOTE — Anesthesia Preprocedure Evaluation (Addendum)
Anesthesia Evaluation  Patient identified by MRN, date of birth, ID band Patient awake    Reviewed: Allergy & Precautions, NPO status , Patient's Chart, lab work & pertinent test results  Airway Mallampati: II  TM Distance: >3 FB Neck ROM: Full    Dental   Pulmonary    Pulmonary exam normal        Cardiovascular hypertension, Pt. on medications Normal cardiovascular exam+ dysrhythmias Atrial Fibrillation      Neuro/Psych    GI/Hepatic GERD  Medicated and Controlled,  Endo/Other  diabetes, Type 2, Insulin Dependent, Oral Hypoglycemic Agents  Renal/GU Renal InsufficiencyRenal disease     Musculoskeletal   Abdominal   Peds  Hematology   Anesthesia Other Findings   Reproductive/Obstetrics                            Anesthesia Physical  Anesthesia Plan  ASA: III  Anesthesia Plan: General   Post-op Pain Management:  Regional for Post-op pain   Induction: Intravenous  PONV Risk Score and Plan: 1 and Ondansetron  Airway Management Planned: Simple Face Mask and LMA  Additional Equipment:   Intra-op Plan:   Post-operative Plan:   Informed Consent: I have reviewed the patients History and Physical, chart, labs and discussed the procedure including the risks, benefits and alternatives for the proposed anesthesia with the patient or authorized representative who has indicated his/her understanding and acceptance.     Plan Discussed with: CRNA and Surgeon  Anesthesia Plan Comments:         Anesthesia Quick Evaluation

## 2017-12-01 NOTE — Transfer of Care (Signed)
Immediate Anesthesia Transfer of Care Note  Patient: [name] [name]  Procedure(s) Performed: HARDWARE REMOVAL LEFT ANKLE (Left )  Patient Location: PACU  Anesthesia Type:General  Level of Consciousness: awake, alert  and oriented  Airway & Oxygen Therapy: Patient Spontanous Breathing  Post-op Assessment: Report given to RN and Post -op Vital signs reviewed and stable  Post vital signs: Reviewed and stable  Last Vitals:  Vitals:   12/01/17 1315 12/01/17 1330  BP: (!) 135/58 (!) 148/71  Pulse: 72 69  Resp: 14 18  Temp:    SpO2: 99% 96%    Last Pain:  Vitals:   12/01/17 1314  TempSrc:   PainSc: 4       Patients Stated Pain Goal: 3 (97/02/63 7858)  Complications: No apparent anesthesia complications

## 2017-12-03 DIAGNOSIS — S82852D Displaced trimalleolar fracture of left lower leg, subsequent encounter for closed fracture with routine healing: Secondary | ICD-10-CM | POA: Diagnosis not present

## 2017-12-03 DIAGNOSIS — R21 Rash and other nonspecific skin eruption: Secondary | ICD-10-CM | POA: Diagnosis not present

## 2017-12-03 LAB — GLUCOSE, CAPILLARY: Glucose-Capillary: 117 mg/dL — ABNORMAL HIGH (ref 65–99)

## 2017-12-04 ENCOUNTER — Encounter (HOSPITAL_COMMUNITY): Payer: Self-pay | Admitting: Orthopedic Surgery

## 2017-12-04 NOTE — Anesthesia Postprocedure Evaluation (Signed)
Anesthesia Post Note  Patient: [name] [name]  Procedure(s) Performed: HARDWARE REMOVAL LEFT ANKLE (Left )     Patient location during evaluation: PACU Anesthesia Type: General Level of consciousness: awake and alert Pain management: pain level controlled Vital Signs Assessment: post-procedure vital signs reviewed and stable Respiratory status: spontaneous breathing, nonlabored ventilation, respiratory function stable and patient connected to nasal cannula oxygen Cardiovascular status: blood pressure returned to baseline and stable Postop Assessment: no apparent nausea or vomiting Anesthetic complications: no    Last Vitals:  Vitals:   12/01/17 1330 12/01/17 1345  BP: (!) 148/71 (!) 141/61  Pulse: 69 69  Resp: 18 15  Temp:  (!) 36.1 C  SpO2: 96% 96%    Last Pain:  Vitals:   12/01/17 1314  TempSrc:   PainSc: 4                  [name]

## 2017-12-05 ENCOUNTER — Inpatient Hospital Stay (HOSPITAL_COMMUNITY)
Admission: EM | Admit: 2017-12-05 | Discharge: 2017-12-13 | DRG: 915 | Disposition: A | Discharge status: Skilled Nursing Facility | Payer: Medicare Other | Attending: Family Medicine | Admitting: Family Medicine

## 2017-12-05 ENCOUNTER — Encounter (HOSPITAL_COMMUNITY): Payer: Self-pay

## 2017-12-05 ENCOUNTER — Other Ambulatory Visit: Payer: Self-pay

## 2017-12-05 DIAGNOSIS — I130 Hypertensive heart and chronic kidney disease with heart failure and stage 1 through stage 4 chronic kidney disease, or unspecified chronic kidney disease: Secondary | ICD-10-CM | POA: Diagnosis not present

## 2017-12-05 DIAGNOSIS — D72829 Elevated white blood cell count, unspecified: Secondary | ICD-10-CM | POA: Diagnosis present

## 2017-12-05 DIAGNOSIS — E0821 Diabetes mellitus due to underlying condition with diabetic nephropathy: Secondary | ICD-10-CM | POA: Diagnosis not present

## 2017-12-05 DIAGNOSIS — I950 Idiopathic hypotension: Secondary | ICD-10-CM | POA: Diagnosis not present

## 2017-12-05 DIAGNOSIS — E1121 Type 2 diabetes mellitus with diabetic nephropathy: Secondary | ICD-10-CM | POA: Diagnosis present

## 2017-12-05 DIAGNOSIS — N183 Chronic kidney disease, stage 3 unspecified: Secondary | ICD-10-CM | POA: Diagnosis present

## 2017-12-05 DIAGNOSIS — T847XXA Infection and inflammatory reaction due to other internal orthopedic prosthetic devices, implants and grafts, initial encounter: Secondary | ICD-10-CM | POA: Diagnosis present

## 2017-12-05 DIAGNOSIS — Z955 Presence of coronary angioplasty implant and graft: Secondary | ICD-10-CM

## 2017-12-05 DIAGNOSIS — I2510 Atherosclerotic heart disease of native coronary artery without angina pectoris: Secondary | ICD-10-CM | POA: Diagnosis present

## 2017-12-05 DIAGNOSIS — E872 Acidosis, unspecified: Secondary | ICD-10-CM | POA: Diagnosis present

## 2017-12-05 DIAGNOSIS — N179 Acute kidney failure, unspecified: Secondary | ICD-10-CM | POA: Diagnosis present

## 2017-12-05 DIAGNOSIS — T886XXA Anaphylactic reaction due to adverse effect of correct drug or medicament properly administered, initial encounter: Secondary | ICD-10-CM | POA: Diagnosis not present

## 2017-12-05 DIAGNOSIS — I480 Paroxysmal atrial fibrillation: Secondary | ICD-10-CM | POA: Diagnosis present

## 2017-12-05 DIAGNOSIS — E1151 Type 2 diabetes mellitus with diabetic peripheral angiopathy without gangrene: Secondary | ICD-10-CM | POA: Diagnosis present

## 2017-12-05 DIAGNOSIS — R4182 Altered mental status, unspecified: Secondary | ICD-10-CM | POA: Diagnosis not present

## 2017-12-05 DIAGNOSIS — E669 Obesity, unspecified: Secondary | ICD-10-CM | POA: Diagnosis present

## 2017-12-05 DIAGNOSIS — R402441 Other coma, without documented Glasgow coma scale score, or with partial score reported, in the field [EMT or ambulance]: Secondary | ICD-10-CM | POA: Diagnosis not present

## 2017-12-05 DIAGNOSIS — I5043 Acute on chronic combined systolic (congestive) and diastolic (congestive) heart failure: Secondary | ICD-10-CM | POA: Diagnosis not present

## 2017-12-05 DIAGNOSIS — I5042 Chronic combined systolic (congestive) and diastolic (congestive) heart failure: Secondary | ICD-10-CM

## 2017-12-05 DIAGNOSIS — T847XXD Infection and inflammatory reaction due to other internal orthopedic prosthetic devices, implants and grafts, subsequent encounter: Secondary | ICD-10-CM

## 2017-12-05 DIAGNOSIS — E875 Hyperkalemia: Secondary | ICD-10-CM | POA: Diagnosis present

## 2017-12-05 DIAGNOSIS — Z66 Do not resuscitate: Secondary | ICD-10-CM | POA: Diagnosis present

## 2017-12-05 DIAGNOSIS — Z6834 Body mass index (BMI) 34.0-34.9, adult: Secondary | ICD-10-CM

## 2017-12-05 DIAGNOSIS — T782XXA Anaphylactic shock, unspecified, initial encounter: Secondary | ICD-10-CM | POA: Diagnosis not present

## 2017-12-05 DIAGNOSIS — K219 Gastro-esophageal reflux disease without esophagitis: Secondary | ICD-10-CM | POA: Diagnosis present

## 2017-12-05 DIAGNOSIS — T380X5A Adverse effect of glucocorticoids and synthetic analogues, initial encounter: Secondary | ICD-10-CM | POA: Diagnosis present

## 2017-12-05 DIAGNOSIS — E1122 Type 2 diabetes mellitus with diabetic chronic kidney disease: Secondary | ICD-10-CM | POA: Diagnosis present

## 2017-12-05 DIAGNOSIS — I952 Hypotension due to drugs: Secondary | ICD-10-CM | POA: Diagnosis present

## 2017-12-05 DIAGNOSIS — T7840XA Allergy, unspecified, initial encounter: Secondary | ICD-10-CM | POA: Diagnosis not present

## 2017-12-05 DIAGNOSIS — G9341 Metabolic encephalopathy: Secondary | ICD-10-CM | POA: Diagnosis not present

## 2017-12-05 DIAGNOSIS — T368X5A Adverse effect of other systemic antibiotics, initial encounter: Secondary | ICD-10-CM | POA: Diagnosis present

## 2017-12-05 DIAGNOSIS — T364X5A Adverse effect of tetracyclines, initial encounter: Secondary | ICD-10-CM | POA: Diagnosis present

## 2017-12-05 DIAGNOSIS — R059 Cough, unspecified: Secondary | ICD-10-CM

## 2017-12-05 DIAGNOSIS — Z933 Colostomy status: Secondary | ICD-10-CM

## 2017-12-05 DIAGNOSIS — E785 Hyperlipidemia, unspecified: Secondary | ICD-10-CM | POA: Diagnosis present

## 2017-12-05 DIAGNOSIS — I4819 Other persistent atrial fibrillation: Secondary | ICD-10-CM | POA: Diagnosis present

## 2017-12-05 DIAGNOSIS — I959 Hypotension, unspecified: Secondary | ICD-10-CM | POA: Diagnosis present

## 2017-12-05 DIAGNOSIS — R05 Cough: Secondary | ICD-10-CM

## 2017-12-05 DIAGNOSIS — H3530 Unspecified macular degeneration: Secondary | ICD-10-CM | POA: Diagnosis present

## 2017-12-05 DIAGNOSIS — Z9049 Acquired absence of other specified parts of digestive tract: Secondary | ICD-10-CM

## 2017-12-05 DIAGNOSIS — L270 Generalized skin eruption due to drugs and medicaments taken internally: Secondary | ICD-10-CM

## 2017-12-05 HISTORY — DX: Allergy, unspecified, initial encounter: T78.40XA

## 2017-12-05 LAB — I-STAT ARTERIAL BLOOD GAS, ED
Acid-base deficit: 12 mmol/L — ABNORMAL HIGH (ref 0.0–2.0)
Bicarbonate: 14.4 mmol/L — ABNORMAL LOW (ref 20.0–28.0)
O2 Saturation: 95 %
TCO2: 15 mmol/L — ABNORMAL LOW (ref 22–32)
pCO2 arterial: 34 mmHg (ref 32.0–48.0)
pH, Arterial: 7.234 — ABNORMAL LOW (ref 7.350–7.450)
pO2, Arterial: 90 mmHg (ref 83.0–108.0)

## 2017-12-05 LAB — BLOOD GAS, ARTERIAL
Acid-base deficit: 10.8 mmol/L — ABNORMAL HIGH (ref 0.0–2.0)
Bicarbonate: 14 mmol/L — ABNORMAL LOW (ref 20.0–28.0)
O2 Saturation: 98.8 %
Patient temperature: 98.6
pCO2 arterial: 27.4 mmHg — ABNORMAL LOW (ref 32.0–48.0)
pH, Arterial: 7.328 — ABNORMAL LOW (ref 7.350–7.450)
pO2, Arterial: 175 mmHg — ABNORMAL HIGH (ref 83.0–108.0)

## 2017-12-05 LAB — CBC WITH DIFFERENTIAL/PLATELET
Band Neutrophils: 0 %
Basophils Absolute: 0 10*3/uL (ref 0.0–0.1)
Basophils Relative: 0 %
Blasts: 0 %
Eosinophils Absolute: 0.2 10*3/uL (ref 0.0–0.7)
Eosinophils Relative: 1 %
HCT: 32.2 % — ABNORMAL LOW (ref 39.0–52.0)
Hemoglobin: 10.7 g/dL — ABNORMAL LOW (ref 13.0–17.0)
Lymphocytes Relative: 14 %
Lymphs Abs: 2.8 10*3/uL (ref 0.7–4.0)
MCH: 31.2 pg (ref 26.0–34.0)
MCHC: 33.2 g/dL (ref 30.0–36.0)
MCV: 93.9 fL (ref 78.0–100.0)
Metamyelocytes Relative: 0 %
Monocytes Absolute: 0.8 10*3/uL (ref 0.1–1.0)
Monocytes Relative: 4 %
Myelocytes: 0 %
Neutro Abs: 16.2 10*3/uL — ABNORMAL HIGH (ref 1.7–7.7)
Neutrophils Relative %: 81 %
Other: 0 %
Platelets: 202 10*3/uL (ref 150–400)
Promyelocytes Absolute: 0 %
RBC: 3.43 MIL/uL — ABNORMAL LOW (ref 4.22–5.81)
RDW: 14.4 % (ref 11.5–15.5)
Smear Review: ADEQUATE
WBC: 20 10*3/uL — ABNORMAL HIGH (ref 4.0–10.5)
nRBC: 0 /100 WBC

## 2017-12-05 LAB — I-STAT CHEM 8, ED
BUN: 73 mg/dL — ABNORMAL HIGH (ref 6–20)
Calcium, Ion: 1.08 mmol/L — ABNORMAL LOW (ref 1.15–1.40)
Chloride: 105 mmol/L (ref 101–111)
Creatinine, Ser: 2.6 mg/dL — ABNORMAL HIGH (ref 0.61–1.24)
Glucose, Bld: 184 mg/dL — ABNORMAL HIGH (ref 65–99)
HCT: 33 % — ABNORMAL LOW (ref 39.0–52.0)
Hemoglobin: 11.2 g/dL — ABNORMAL LOW (ref 13.0–17.0)
Potassium: 6 mmol/L — ABNORMAL HIGH (ref 3.5–5.1)
Sodium: 132 mmol/L — ABNORMAL LOW (ref 135–145)
TCO2: 16 mmol/L — ABNORMAL LOW (ref 22–32)

## 2017-12-05 LAB — HEMOGLOBIN A1C
Hgb A1c MFr Bld: 6.4 % — ABNORMAL HIGH (ref 4.8–5.6)
Mean Plasma Glucose: 136.98 mg/dL

## 2017-12-05 LAB — GLUCOSE, CAPILLARY: Glucose-Capillary: 205 mg/dL — ABNORMAL HIGH (ref 65–99)

## 2017-12-05 MED ORDER — SENNOSIDES-DOCUSATE SODIUM 8.6-50 MG PO TABS: 1.0000 | ORAL_TABLET | Freq: Every day | ORAL | Status: DC | PRN

## 2017-12-05 MED ORDER — FAMOTIDINE 20 MG IN NS 100 ML IVPB
20.0000 mg | Freq: Once | INTRAVENOUS | Status: AC
  Administered 2017-12-05: 20 mg via INTRAVENOUS
  Filled 2017-12-05: qty 100

## 2017-12-05 MED ORDER — OXYCODONE HCL 5 MG PO TABS: 5.0000 mg | ORAL_TABLET | ORAL | Status: DC | PRN

## 2017-12-05 MED ORDER — EPINEPHRINE 0.3 MG/0.3ML IJ SOAJ
0.3000 mg | Freq: Once | INTRAMUSCULAR | Status: AC
  Administered 2017-12-05: 0.3 mg via INTRAMUSCULAR
  Filled 2017-12-05: qty 0.3

## 2017-12-05 MED ORDER — BUPROPION HCL 75 MG PO TABS
75.0000 mg | ORAL_TABLET | Freq: Every day | ORAL | Status: DC
  Administered 2017-12-06 – 2017-12-13 (×8): 75 mg via ORAL
  Filled 2017-12-05 (×8): qty 1

## 2017-12-05 MED ORDER — INSULIN ASPART 100 UNIT/ML ~~LOC~~ SOLN
0.0000 [IU] | Freq: Every day | SUBCUTANEOUS | Status: DC
  Administered 2017-12-05 – 2017-12-11 (×2): 2 [IU] via SUBCUTANEOUS

## 2017-12-05 MED ORDER — SODIUM CHLORIDE 0.9% FLUSH
3.0000 mL | Freq: Two times a day (BID) | INTRAVENOUS | Status: DC
  Administered 2017-12-07 – 2017-12-13 (×9): 3 mL via INTRAVENOUS

## 2017-12-05 MED ORDER — DOCUSATE SODIUM 100 MG PO CAPS
100.0000 mg | ORAL_CAPSULE | Freq: Two times a day (BID) | ORAL | Status: DC
  Administered 2017-12-05 – 2017-12-13 (×8): 100 mg via ORAL
  Filled 2017-12-05 (×11): qty 1

## 2017-12-05 MED ORDER — SIMVASTATIN 20 MG PO TABS
20.0000 mg | ORAL_TABLET | Freq: Every day | ORAL | Status: DC
  Administered 2017-12-05 – 2017-12-12 (×6): 20 mg via ORAL
  Filled 2017-12-05 (×7): qty 1

## 2017-12-05 MED ORDER — DIPHENHYDRAMINE HCL 25 MG PO CAPS
25.0000 mg | ORAL_CAPSULE | Freq: Four times a day (QID) | ORAL | Status: DC
  Administered 2017-12-05 – 2017-12-06 (×3): 25 mg via ORAL
  Filled 2017-12-05 (×3): qty 1

## 2017-12-05 MED ORDER — OXYCODONE-ACETAMINOPHEN 5-325 MG PO TABS: 1.0000 | ORAL_TABLET | ORAL | Status: DC | PRN

## 2017-12-05 MED ORDER — METHYLPREDNISOLONE SODIUM SUCC 125 MG IJ SOLR
60.0000 mg | Freq: Four times a day (QID) | INTRAMUSCULAR | Status: DC
  Administered 2017-12-05 – 2017-12-06 (×3): 60 mg via INTRAVENOUS
  Filled 2017-12-05 (×3): qty 2

## 2017-12-05 MED ORDER — SODIUM CHLORIDE 0.9 % IV BOLUS (SEPSIS)
1000.0000 mL | Freq: Once | INTRAVENOUS | Status: AC
  Administered 2017-12-05: 1000 mL via INTRAVENOUS

## 2017-12-05 MED ORDER — SODIUM CHLORIDE 0.9 % IV SOLN
INTRAVENOUS | Status: DC
Start: 2017-12-05 — End: 2017-12-05
  Administered 2017-12-05: 13:00:00 via INTRAVENOUS

## 2017-12-05 MED ORDER — SODIUM BICARBONATE 8.4 % IV SOLN
INTRAVENOUS | Status: DC
  Administered 2017-12-05: 20:00:00 via INTRAVENOUS
  Filled 2017-12-05 (×2): qty 1000

## 2017-12-05 MED ORDER — FERROUS SULFATE 325 (65 FE) MG PO TABS
325.0000 mg | ORAL_TABLET | Freq: Two times a day (BID) | ORAL | Status: DC
  Administered 2017-12-06 – 2017-12-13 (×14): 325 mg via ORAL
  Filled 2017-12-05 (×15): qty 1

## 2017-12-05 MED ORDER — ENOXAPARIN SODIUM 30 MG/0.3ML ~~LOC~~ SOLN
30.0000 mg | SUBCUTANEOUS | Status: DC
  Administered 2017-12-05 – 2017-12-06 (×2): 30 mg via SUBCUTANEOUS
  Filled 2017-12-05 (×2): qty 0.3

## 2017-12-05 MED ORDER — ONDANSETRON HCL 4 MG PO TABS: 4.0000 mg | ORAL_TABLET | Freq: Four times a day (QID) | ORAL | Status: DC | PRN

## 2017-12-05 MED ORDER — FAMOTIDINE 20 MG IN NS 100 ML IVPB
20.0000 mg | Freq: Two times a day (BID) | INTRAVENOUS | Status: DC
  Administered 2017-12-05 – 2017-12-08 (×6): 20 mg via INTRAVENOUS
  Filled 2017-12-05 (×7): qty 100

## 2017-12-05 MED ORDER — ASPIRIN EC 81 MG PO TBEC
81.0000 mg | DELAYED_RELEASE_TABLET | Freq: Every day | ORAL | Status: DC
  Administered 2017-12-06 – 2017-12-13 (×8): 81 mg via ORAL
  Filled 2017-12-05 (×8): qty 1

## 2017-12-05 MED ORDER — METHYLPREDNISOLONE SODIUM SUCC 125 MG IJ SOLR
125.0000 mg | Freq: Once | INTRAMUSCULAR | Status: AC
  Administered 2017-12-05: 125 mg via INTRAVENOUS
  Filled 2017-12-05: qty 2

## 2017-12-05 MED ORDER — OXYCODONE-ACETAMINOPHEN 10-325 MG PO TABS
1.0000 | ORAL_TABLET | ORAL | Status: DC | PRN
Start: 2017-12-05 — End: 2017-12-05

## 2017-12-05 MED ORDER — AMIODARONE HCL 200 MG PO TABS
200.0000 mg | ORAL_TABLET | Freq: Every day | ORAL | Status: DC
  Administered 2017-12-06 – 2017-12-13 (×8): 200 mg via ORAL
  Filled 2017-12-05 (×8): qty 1

## 2017-12-05 MED ORDER — ACETAMINOPHEN 650 MG RE SUPP
650.0000 mg | Freq: Four times a day (QID) | RECTAL | Status: DC | PRN
Start: 2017-12-05 — End: 2017-12-13

## 2017-12-05 MED ORDER — EPINEPHRINE 0.3 MG/0.3ML IJ SOAJ: 0.3000 mg | INTRAMUSCULAR | Status: DC | PRN

## 2017-12-05 MED ORDER — INSULIN ASPART 100 UNIT/ML ~~LOC~~ SOLN
0.0000 [IU] | Freq: Three times a day (TID) | SUBCUTANEOUS | Status: DC
  Administered 2017-12-06: 2 [IU] via SUBCUTANEOUS
  Administered 2017-12-06 – 2017-12-07 (×3): 5 [IU] via SUBCUTANEOUS
  Administered 2017-12-07: 2 [IU] via SUBCUTANEOUS
  Administered 2017-12-07: 5 [IU] via SUBCUTANEOUS
  Administered 2017-12-08 (×2): 3 [IU] via SUBCUTANEOUS
  Administered 2017-12-08 – 2017-12-10 (×4): 2 [IU] via SUBCUTANEOUS
  Administered 2017-12-10: 3 [IU] via SUBCUTANEOUS
  Administered 2017-12-11: 2 [IU] via SUBCUTANEOUS
  Administered 2017-12-11: 5 [IU] via SUBCUTANEOUS
  Administered 2017-12-11: 2 [IU] via SUBCUTANEOUS
  Administered 2017-12-12 (×3): 3 [IU] via SUBCUTANEOUS
  Administered 2017-12-13 (×4): 2 [IU] via SUBCUTANEOUS

## 2017-12-05 MED ORDER — ACETAMINOPHEN 325 MG PO TABS: 650.0000 mg | ORAL_TABLET | Freq: Four times a day (QID) | ORAL | Status: DC | PRN

## 2017-12-05 MED ORDER — ONDANSETRON HCL 4 MG/2ML IJ SOLN: 4.0000 mg | Freq: Four times a day (QID) | INTRAMUSCULAR | Status: DC | PRN

## 2017-12-05 MED ORDER — DIPHENHYDRAMINE HCL 25 MG PO CAPS
25.0000 mg | ORAL_CAPSULE | Freq: Once | ORAL | Status: AC
  Administered 2017-12-05: 25 mg via ORAL
  Filled 2017-12-05: qty 1

## 2017-12-05 NOTE — H&P (Signed)
History and Physical    [name] [name] QPR:[id number] DOB: 11/21/1932 DOA: 12/05/2017  PCP: [name], MD  Patient coming from: [place]  Chief Complaint: allergic reaction, AMS  HPI: [name] [name] is a 84 y.o. male with medical history significant of UC s/p total colectomy; afib not on AC; HTN; HLD; DM; stage 3 CKD; and chronic diastolic CHF presenting with AMS.  Patient had a wound dehiscence of his left ankle with hardware removal by Dr. Sharol Given on 2/15; a Praveena wound vac was placed.  He was returned to [facility] on Doxycycline.  The medication was changed due to development of a rash and he was then started on Clindamycin.  The brother reports that the patient looked normal last night, but this AM he had diffuse edema of the face and body as well as diffuse erythema.  He was also quite altered.  The patient was able to provide some history: He woke up about 730 this AM to get a bath , he wasn't able to eat and was just miserable.  He was nauseated.  Friday, he had been playing BINGO, went to bed and woke up Sunday and he was puffy.  They called the doctor but then waited another day to call the doctor.  He now still feels a bit dizzy but thinks he can eat some.  He has been taking a pill ... Very long story and it is not clear that he is actually answering the question posed.  BP as I entered the room:  56/34 Subsequent BPs: 105/55; 118/49  ED Course: Rash, anaphylaxis after doxy (stopped Friday) and Clindamycin.  AMS this AM, now back to baseline.  He has received IV Epi, IVF - BPs now >100, mentating well.  "Everyone in our family is allergic to Clindamycin."  This was started due to hardware removal with infection and wound vac placement - ?ongoing abx needed.  Needs ongoing monitoring for stability - ?overnight observation.  Consider allergic reaction management.  BP remains in 100 range, some concern for relapse so will monitor in SDU.  ICU is aware and will follow.     Review of Systems: As per HPI; otherwise review of systems reviewed and negative. This is very suspect based on AMS.  Ambulatory Status:  Non-weight bearing on his left foot  Past Medical History:  Diagnosis Date  . Anemia   . Atrial flutter ([place])    a. s/p RFCA of counterclockwise cavo-tricuspid isthmus dependent atrial flutter 2009 by Dr. Rayann Heman.  Marland Kitchen CAD (coronary artery disease) 06/2014   a. 06/2014: abnl nuc. Cath: Totally occluded LAD with faint collaterals (not a candidate for CTO PCI), 90% ramus s/p DES, 50-70% OM2.  . Chronic diastolic CHF (congestive heart failure) ([place])    a. 2D Echo 10/23/13: EF 50-55%, basal mid-inf HK, grade 2 DD, mild MR, mod dilated LA, no sig change from prior.  . CKD (chronic kidney disease), stage III ([place])   . Colostomy in place [facility])   . Complication of anesthesia    " during my kidney stone surgery my heart went out of rhythm  . Compression fracture of L1 lumbar vertebra ([name])   . Diabetes ([place])    TYPE 2   . Dyslipidemia   . Dyspnea   . Frequent PVCs   . GERD (gastroesophageal reflux disease)   . History of blood transfusion   . History of kidney stones   . History of peptic ulcer disease   . HTN (  hypertension)   . Hx of acute renal failure 01/02/10-3/24-11   due to hypovolemic shock, gastroenteritis and dehydration,hospitalized . Did requre a few days of dialysis. Cr at discharge was 1.8  . Kidney disease   . Kidney stone may 2009   Right hydronephrosis, S/P stone removal  . Lower back pain   . Macular degeneration   . OA (osteoarthritis) of hip   . Obesity   . Osteopenia   . PAF (paroxysmal atrial fibrillation) ([name])    not on long term anticoagulation due to history of heme positive stools and anemia  . Shingles    episode  . Small bowel obstruction, partial ([place]) 2009   Episode  . Ulcerative colitis ([place])    a. s/p total colectomy remotely.    Past Surgical History:  Procedure Laterality Date  . BACK SURGERY    . CARDIAC  CATHETERIZATION  06/19/2014  . COLON RESECTION    . COLOSTOMY    . CORONARY ANGIOPLASTY    . CORONARY STENT PLACEMENT  06/19/2014   DES       dr Martinique  . HARDWARE REMOVAL Left 12/01/2017   Procedure: HARDWARE REMOVAL LEFT ANKLE;  Surgeon: Newt Minion, MD;  Location: [facility];  Service: Orthopedics;  Laterality: Left;  . HERNIA REPAIR    . LEFT AND RIGHT HEART CATHETERIZATION WITH CORONARY ANGIOGRAM N/A 06/19/2014   Procedure: LEFT AND RIGHT HEART CATHETERIZATION WITH CORONARY ANGIOGRAM;  Surgeon: Peter M Martinique, MD;  Location: [facility] C[facility] LAB;  Service: Cardiovascular;  Laterality: N/A;  . ORIF ANKLE FRACTURE Left 10/25/2017   Procedure: OPEN REDUCTION INTERNAL FIXATION (ORIF) LEFT ANKLE FRACTURE;  Surgeon: Newt Minion, MD;  Location: [facility];  Service: Orthopedics;  Laterality: Left;  . TONSILLECTOMY      Social History   Socioeconomic History  . Marital status: Widowed    Spouse name: Not on file  . Number of children: Not on file  . Years of education: Not on file  . Highest education level: Not on file  Social Needs  . Financial resource strain: Not on file  . Food insecurity - worry: Not on file  . Food insecurity - inability: Not on file  . Transportation needs - medical: Not on file  . Transportation needs - non-medical: Not on file  Occupational History  . Not on file  Tobacco Use  . Smoking status: Never Smoker  . Smokeless tobacco: Never Used  Substance and Sexual Activity  . Alcohol use: No  . Drug use: No  . Sexual activity: Not on file  Other Topics Concern  . Not on file  Social History Narrative  . Not on file    Allergies  Allergen Reactions  . Brilinta [Ticagrelor] Shortness Of Breath and Other (See Comments)    Changed to Plavix due to SOB  . Doxycycline Anaphylaxis  . Penicillins Rash and Other (See Comments)    Has patient had a PCN reaction causing immediate rash, facial/tongue/throat swelling, SOB or lightheadedness with hypotension:Yes Has patient  had a PCN reaction causing severe rash involving mucus membranes or skin necrosis: No Has patient had a PCN reaction that required hospitalization: No Has patient had a PCN reaction occurring within the last 10 years: No If all of the above answers are "NO", then may proceed with Cephalosporin use.  . Sulfa Antibiotics Other (See Comments)    Pt does not remember the reaction, but it sure the allergy exists.    Family History  Problem  Relation Age of Onset  . Colon cancer Mother     Prior to Admission medications   Medication Sig Start Date End Date Taking? Authorizing Provider  acetaminophen (TYLENOL) 500 MG tablet Take 1 tablet (500 mg total) by mouth every 6 (six) hours as needed (for pain or headaches). Available over the counter 01/08/17  Yes Hongalgi, Lenis Dickinson, MD  amiodarone (PACERONE) 200 MG tablet Take 1 tablet (200 mg total) by mouth daily. 07/03/17  Yes Sueanne Margarita, MD  aspirin 81 MG tablet Take 81 mg by mouth daily.   Yes [provider]  buPROPion (WELLBUTRIN) 75 MG tablet Take 75 mg by mouth daily.   Yes [provider]  calcium carbonate (OS-CAL) 600 MG TABS tablet Take 600 mg by mouth daily with breakfast.   Yes [provider]  cholecalciferol (VITAMIN D) 1000 units tablet Take 3,000 Units by mouth daily.   Yes [provider]  clindamycin (CLEOCIN) 300 MG capsule Take 300 mg by mouth 3 (three) times daily.   Yes [provider]  docusate sodium (COLACE) 100 MG capsule Take 1 capsule (100 mg total) by mouth 2 (two) times daily. 10/27/17  Yes Patrecia Pour, Christean Grief, MD  ferrous sulfate 325 (65 FE) MG tablet Take 1 tablet (325 mg total) by mouth 2 (two) times daily with a meal. 10/27/14  Yes Florencia Reasons, MD  hydrOXYzine (VISTARIL) 25 MG capsule Take 25 mg by mouth See admin instructions. 10m by mouth at bedtime, may take additional 251mthree times daily as needed for itching   Yes [provider]  omeprazole (PRILOSEC) 20 MG  capsule Take 20 mg by mouth every evening.   Yes [provider]  oxyCODONE-acetaminophen (PERCOCET) 10-325 MG tablet Take 1 tablet by mouth every 4 (four) hours as needed for pain. 10/27/17  Yes SiPatrecia PourEdChristean GriefMD  pioglitazone (ACTOS) 30 MG tablet Take 1 tablet (30 mg total) by mouth daily. 11/05/14  Yes Dunn, Dayna N, PA-C  promethazine (PHENERGAN) 25 MG tablet Take 25 mg by mouth 3 (three) times daily as needed for nausea or vomiting.   Yes [provider]  ranitidine (ZANTAC) 150 MG tablet Take 150 mg by mouth daily.   Yes [provider]  saxagliptin HCl (ONGLYZA) 5 MG TABS tablet Take 1 tablet (5 mg total) by mouth daily. 06/20/14  Yes WeTanda RockersMD  senna-docusate (SENNA S) 8.6-50 MG tablet Take 1 tablet by mouth daily as needed for mild constipation.   Yes [provider]  simvastatin (ZOCOR) 20 MG tablet Take 20 mg by mouth at bedtime.    Yes [provider]  tamsulosin (FLOMAX) 0.4 MG CAPS capsule Take 1 capsule (0.4 mg total) by mouth daily after supper. 10/27/14  Yes XuFlorencia ReasonsMD  doxycycline (VIBRA-TABS) 100 MG tablet Take 1 tablet (100 mg total) by mouth 2 (two) times daily. Patient not taking: Reported on 12/05/2017 11/30/17   DuNewt MinionMD    Physical Exam: Vitals:   12/05/17 1545 12/05/17 1600 12/05/17 1615 12/05/17 1640  BP: (!) 122/41 (!) 103/51 (!) 104/59 (!) 105/55  Pulse: 82 81 82 79  Resp: 12 17 13 14   Temp:      TempSrc:      SpO2: 100% 97% 98% 98%  Weight:      Height:         General: Diffuse facial and body erythema without apparent mucosal involvement and no excoriation or ulceration or skin breakdown.  The patient is also generally edematous without clear evidence of discrete edema. Eyes:  PERRL, EOMI, normal lids, iris ENT:  grossly normal hearing, lips & tongue, mmm; no apparent tongue/lip swelling Neck:  no LAD, masses or thyromegaly; no carotid bruits.  Diffuse facial and neck edema without apparent  airway compromise. Cardiovascular:  RRR, no m/r/g. Respiratory:  CTA bilaterally with no wheezes/rales/rhonchi.  Normal respiratory effort. Abdomen:  soft, NT, ND, NABS; colostomy in place Skin: diffuse erythema of the entire face and body; his B hands are particularly erythematous Musculoskeletal:  Some myoclonic jerks noted.  L foot in CAM walker; once removed, the foot is well bandaged with tegaderm, wound vac in place with minimal drainage, no apparent erythema of the toes or the leg just proximal to the dressing, reasonable circulation to the distal foot Psychiatric: able to answer some questions but he is clearly still altered, follows some commands Neurologic: unable to perform    Radiological Exams on Admission: No results found.  EKG: Independently reviewed.  NSR with rate 82; IVCD, LVH, no evidence of acute ischemia   Labs on Admission: I have personally reviewed the available labs and imaging studies at the time of the admission.  Pertinent labs:   ABG x 2: 7.328/27.4/175/14.0; 7.234/34/90/14.4 Na++ 132 K+ 6.0; 5.4 on 2/15 Glucose 184 BUN 73/Creatinine 2.60; 43/1.38/46 on 2/15 WBC 20.0 Hgb 10.7 - stable   Assessment/Plan Principal Problem:   Allergic reaction caused by a drug Active Problems:   Chronic combined systolic and diastolic CHF (congestive heart failure) ([name])   Paroxysmal atrial fibrillation ([name])   Diabetes mellitus with diabetic nephropathy without long-term current use of insulin ([name])   Hardware complicating wound infection ([place])   Acute renal failure superimposed on stage 3 chronic kidney disease ([name])   Metabolic acidosis   Allergic reaction -Patient with prior h/o reported allergy to PCN and Sulfa drugs presenting with apparent reaction to both doxycycline (rash) and clindamycin (diffuse rash without apparent mucosal involvement) but with generalized edema. -Does not appear to involve sloughing or mucosal surfaces and so currently with low  concern for Stevens-Johnson Syndrome but this may need to be considered if symptoms worsen -Patient received Epi, Benadryl, Solumedrol, Pepcid while in the ER with apparent stabilization -Will continue previously ordered medications at this time (Epi prn) and monitor overnight in SDU  Hypotension -Patient became profoundly hypotensive while in the ER (he was 56/34 when I entered the room, which was reportedly after stabilization) -Due to apparent BP stability, PCCM was consulted and will monitor patient but does not believe that patient requires ICU-level care at this time -His BP has been fluid-responsive and so will continue IVF infusion at 125 cc/hr -Hypotension is thought to be related to allergic reaction -Will observe in SDU overnight -If allergic reaction/BP stabilize, patient may be appropriate for d/c back to SNF in the next 1-2 days  Acute renal failure on CKD with metabolic acidosis -Thought to be related to allergic reaction and volume deficiency -Patient has been rehydrated with IVF and we are continuing an infusion -He has mild hyperkalemia that is likely associated with the volume deficiency -Will continue to rehydrate overnight and recheck BMP in AM -D5W with 3 amps bicarb at 75 cc/hour (patient briefly discussed with Dr. Hollie Salk and this is her recommendation; if not improved tomorrow, will need formal nephrology evaluation)  Wound infection -Patient discussed with Dr. Sharol Given -While it would be ideal for the patient to remain on antibiotics, his foot appears to be  healing well with the wound vac in place; AND he now has listed allergies to PCN, Sulfa, Doxy; and Clinda.   -As a result, we are in agreement to stop antibiotics at this time. -Dr. Sharol Given has not been consulted at this time since this is no current additional question regarding this patient. -Wound vac will remain in place; CAM walker is on; and the patient is non-weight bearing.  Afib -Rate control with  Amiodarone -He is not on [facility]  CHF -Appears to be compensated at this time -Will need to monitor for development of issues since he is continuing to receive IVF -EF 40-45% with grade 1 diastolic dysfunction in 7/27  DM -Hold Actos and Onglyza -Cover with moderate-scale SSI -A1c in 3/18 was 6.5, will recheck  DVT prophylaxis:  Lovenox Code Status:  DNR - confirmed with gold paper at bedside Family Communication: Brother present throughout evaluation  Disposition Plan:  Back to SNF once clinically improved Consults called:  Informal ortho and nephrology consults; PCCM has also seen the patient Admission status:  It is my clinical opinion that referral for OBSERVATION is reasonable and necessary in this patient based on the above information provided. The aforementioned taken together are felt to place the patient at high risk for further clinical deterioration. However it is anticipated that the patient may be medically stable for discharge from the hospital within 24 to 48 hours.    Karmen Bongo MD [facility]  If note is complete, please contact covering daytime or nighttime physician. [email or web address] Password [facility]  12/05/2017, 6:05 PM

## 2017-12-05 NOTE — ED Notes (Signed)
MD at the bedside  

## 2017-12-05 NOTE — ED Notes (Signed)
Patient lost IV access. Diff to stick-IV team consulted. Phlebotomy consulted

## 2017-12-05 NOTE — Progress Notes (Signed)
Patient arrived on the unit from the ER vital signs obtained see flowsheet, placed on tele ccmd notified, patient oriented to room and staff, bed in lowest position, call bell in reach will continue to monitor.

## 2017-12-05 NOTE — ED Notes (Signed)
Patient's sister called RN to ask that patient said he cannot find his glasses and wallet.

## 2017-12-05 NOTE — ED Provider Notes (Signed)
[facility] EMERGENCY DEPARTMENT Provider Note   CSN: [id number] Arrival date & time: 12/05/17  8101     History   Chief Complaint Chief Complaint  Patient presents with  . Allergic Reaction  . Altered Mental Status    HPI [name] [name] is a 84 y.o. male.  He presents for evaluation of allergic reaction, suspected to be from doxycycline following initiation after recent lower extremity surgery.  He had removal of plates from left ankle, and a wound VAC applied, apparently for infection.  Apparently he has had a rash for 3 days, despite doxycycline being stopped.  He has been started on clindamycin, following discontinuation of doxycycline.  Reportedly, he is usually alert and oriented x4.  He is unable to give any history.  Level 5 caveat-altered mental status.    HPI  Past Medical History:  Diagnosis Date  . Anemia   . Atrial flutter ([place])    a. s/p RFCA of counterclockwise cavo-tricuspid isthmus dependent atrial flutter 2009 by Dr. Rayann Heman.  Marland Kitchen CAD (coronary artery disease) 06/2014   a. 06/2014: abnl nuc. Cath: Totally occluded LAD with faint collaterals (not a candidate for CTO PCI), 90% ramus s/p DES, 50-70% OM2.  . Chronic diastolic CHF (congestive heart failure) ([place])    a. 2D Echo 10/23/13: EF 50-55%, basal mid-inf HK, grade 2 DD, mild MR, mod dilated LA, no sig change from prior.  . CKD (chronic kidney disease), stage III ([place])   . Colostomy in place [facility])   . Complication of anesthesia    " during my kidney stone surgery my heart went out of rhythm  . Compression fracture of L1 lumbar vertebra ([name])   . Diabetes ([place])    TYPE 2   . Dyslipidemia   . Dyspnea   . Frequent PVCs   . GERD (gastroesophageal reflux disease)   . History of blood transfusion   . History of kidney stones   . History of peptic ulcer disease   . HTN (hypertension)   . Hx of acute renal failure 01/02/10-3/24-11   due to hypovolemic shock, gastroenteritis and  dehydration,hospitalized . Did requre a few days of dialysis. Cr at discharge was 1.8  . Kidney disease   . Kidney stone may 2009   Right hydronephrosis, S/P stone removal  . Lower back pain   . Macular degeneration   . OA (osteoarthritis) of hip   . Obesity   . Osteopenia   . PAF (paroxysmal atrial fibrillation) ([name])    not on long term anticoagulation due to history of heme positive stools and anemia  . Shingles    episode  . Small bowel obstruction, partial ([place]) 2009   Episode  . Ulcerative colitis ([place])    a. s/p total colectomy remotely.    Patient Active Problem List   Diagnosis Date Noted  . Hardware complicating wound infection ([place]) 11/30/2017  . Trimalleolar fracture of ankle, closed, left, initial encounter   . Fall 10/24/2017  . Malleolar fracture, left, closed, initial encounter 10/24/2017  . Fibula fracture 10/24/2017  . Diabetes mellitus with diabetic nephropathy without long-term current use of insulin ([place]) 01/05/2017  . CKD (chronic kidney disease), stage III ([place])   . Chronic combined systolic and diastolic CHF (congestive heart failure) ([place])   . Paroxysmal atrial fibrillation ([name])   . Bleeding gastrointestinal   . Dyslipidemia 10/14/2014  . CAD in native artery- total LAD, s/p RI DES 06/19/14 07/11/2014  . Ulcerative colitis ([place])  Past Surgical History:  Procedure Laterality Date  . BACK SURGERY    . CARDIAC CATHETERIZATION  06/19/2014  . COLON RESECTION    . COLOSTOMY    . CORONARY ANGIOPLASTY    . CORONARY STENT PLACEMENT  06/19/2014   DES       dr Martinique  . HARDWARE REMOVAL Left 12/01/2017   Procedure: HARDWARE REMOVAL LEFT ANKLE;  Surgeon: Newt Minion, MD;  Location: [facility];  Service: Orthopedics;  Laterality: Left;  . HERNIA REPAIR    . LEFT AND RIGHT HEART CATHETERIZATION WITH CORONARY ANGIOGRAM N/A 06/19/2014   Procedure: LEFT AND RIGHT HEART CATHETERIZATION WITH CORONARY ANGIOGRAM;  Surgeon: Peter M Martinique, MD;  Location: [facility] CATH LAB;   Service: Cardiovascular;  Laterality: N/A;  . ORIF ANKLE FRACTURE Left 10/25/2017   Procedure: OPEN REDUCTION INTERNAL FIXATION (ORIF) LEFT ANKLE FRACTURE;  Surgeon: Newt Minion, MD;  Location: [facility];  Service: Orthopedics;  Laterality: Left;  . TONSILLECTOMY         Home Medications    Prior to Admission medications   Medication Sig Start Date End Date Taking? Authorizing Provider  acetaminophen (TYLENOL) 500 MG tablet Take 1 tablet (500 mg total) by mouth every 6 (six) hours as needed (for pain or headaches). Available over the counter 01/08/17  Yes Hongalgi, Lenis Dickinson, MD  amiodarone (PACERONE) 200 MG tablet Take 1 tablet (200 mg total) by mouth daily. 07/03/17  Yes Sueanne Margarita, MD  aspirin 81 MG tablet Take 81 mg by mouth daily.   Yes [provider]  buPROPion (WELLBUTRIN) 75 MG tablet Take 75 mg by mouth daily.   Yes [provider]  calcium carbonate (OS-CAL) 600 MG TABS tablet Take 600 mg by mouth daily with breakfast.   Yes [provider]  cholecalciferol (VITAMIN D) 1000 units tablet Take 3,000 Units by mouth daily.   Yes [provider]  clindamycin (CLEOCIN) 300 MG capsule Take 300 mg by mouth 3 (three) times daily.   Yes [provider]  docusate sodium (COLACE) 100 MG capsule Take 1 capsule (100 mg total) by mouth 2 (two) times daily. 10/27/17  Yes Patrecia Pour, Christean Grief, MD  ferrous sulfate 325 (65 FE) MG tablet Take 1 tablet (325 mg total) by mouth 2 (two) times daily with a meal. 10/27/14  Yes Florencia Reasons, MD  hydrOXYzine (VISTARIL) 25 MG capsule Take 25 mg by mouth See admin instructions. 22m by mouth at bedtime, may take additional 232mthree times daily as needed for itching   Yes [provider]  omeprazole (PRILOSEC) 20 MG capsule Take 20 mg by mouth every evening.   Yes [provider]  oxyCODONE-acetaminophen (PERCOCET) 10-325 MG tablet Take 1 tablet by mouth every 4 (four) hours as needed for pain. 10/27/17  Yes  SiPatrecia PourEdChristean GriefMD  pioglitazone (ACTOS) 30 MG tablet Take 1 tablet (30 mg total) by mouth daily. 11/05/14  Yes Dunn, Dayna N, PA-C  promethazine (PHENERGAN) 25 MG tablet Take 25 mg by mouth 3 (three) times daily as needed for nausea or vomiting.   Yes [provider]  ranitidine (ZANTAC) 150 MG tablet Take 150 mg by mouth daily.   Yes [provider]  saxagliptin HCl (ONGLYZA) 5 MG TABS tablet Take 1 tablet (5 mg total) by mouth daily. 06/20/14  Yes WeTanda RockersMD  senna-docusate (SENNA S) 8.6-50 MG tablet Take 1 tablet by mouth daily as needed for mild constipation.   Yes [provider]  simvastatin (ZOCOR) 20 MG tablet Take 20 mg by mouth at bedtime.    Yes [provider]  tamsulosin (FLOMAX) 0.4 MG CAPS capsule Take 1 capsule (0.4 mg total) by mouth daily after supper. 10/27/14  Yes Florencia Reasons, MD  doxycycline (VIBRA-TABS) 100 MG tablet Take 1 tablet (100 mg total) by mouth 2 (two) times daily. Patient not taking: Reported on 12/05/2017 11/30/17   Newt Minion, MD    Family History Family History  Problem Relation Age of Onset  . Colon cancer Mother     Social History Social History   Tobacco Use  . Smoking status: Never Smoker  . Smokeless tobacco: Never Used  Substance Use Topics  . Alcohol use: No  . Drug use: No     Allergies   Brilinta [ticagrelor]; Doxycycline; Penicillins; and Sulfa antibiotics   Review of Systems Review of Systems  All other systems reviewed and are negative.    Physical Exam Updated Vital Signs BP (!) 103/51   Pulse 81   Temp 97.6 F (36.4 C)   Resp 17   Ht 5' 8"  (1.727 m)   Wt 102.1 kg (225 lb)   SpO2 97%   BMI 34.21 kg/m   Physical Exam  Constitutional: He is oriented to person, place, and time. He appears well-developed. He appears distressed (Uncomfortable).  Elderly, frail, obese  HENT:  Head: Normocephalic and atraumatic.  Right Ear: External ear normal.  Left Ear: External ear  normal.  Oral mucous membranes are dry.  No lingual swelling, no posterior pharynx swelling.  Airway is intact. No angioedema.  Eyes: Conjunctivae and EOM are normal. Pupils are equal, round, and reactive to light.  Neck: Normal range of motion and phonation normal. Neck supple. No tracheal deviation present.  No stridor  Cardiovascular: Normal rate, regular rhythm and normal heart sounds.  Hypotensive initially  Pulmonary/Chest: Effort normal and breath sounds normal. He exhibits no bony tenderness.  Abdominal: Soft. There is no tenderness.  Musculoskeletal: Normal range of motion.  Left leg, with mild swelling, wound VAC applied to left lateral malleolus region.  Wound dressing on entire left lower leg.  Left leg is nontender to palpation.  Neurological: He is alert and oriented to person, place, and time. No cranial nerve deficit or sensory deficit. He exhibits normal muscle tone. Coordination normal.  Skin: Skin is warm, dry and intact.  Generalized red rash, no petechiae, no vesicles.  Psychiatric: He has a normal mood and affect. His behavior is normal. Judgment and thought content normal.  Nursing note and vitals reviewed.    ED Treatments / Results  Labs (all labs ordered are listed, but only abnormal results are displayed) Labs Reviewed  CBC WITH DIFFERENTIAL/PLATELET - Abnormal; Notable for the following components:      Result Value   WBC 20.0 (*)    RBC 3.43 (*)    Hemoglobin 10.7 (*)    HCT 32.2 (*)    Neutro Abs 16.2 (*)    All other components within normal limits  BLOOD GAS, ARTERIAL - Abnormal; Notable for the following components:   pH, Arterial 7.328 (*)    pCO2 arterial 27.4 (*)    pO2, Arterial 175 (*)    Bicarbonate 14.0 (*)    Acid-base deficit 10.8 (*)    All other components within normal limits  I-STAT CHEM 8, ED - Abnormal; Notable for the following components:   Sodium 132 (*)    Potassium 6.0 (*)  BUN 73 (*)    Creatinine, Ser 2.60 (*)     Glucose, Bld 184 (*)    Calcium, Ion 1.08 (*)    TCO2 16 (*)    Hemoglobin 11.2 (*)    HCT 33.0 (*)    All other components within normal limits   BUN  Date Value Ref Range Status  12/05/2017 73 (H) 6 - 20 mg/dL Final  12/01/2017 43 (H) 6 - 20 mg/dL Final  10/27/2017 44 (H) 6 - 20 mg/dL Final  10/26/2017 35 (H) 6 - 20 mg/dL Final   Creat  Date Value Ref Range Status  01/12/2016 2.04 (H) 0.70 - 1.11 mg/dL Final   Creatinine, Ser  Date Value Ref Range Status  12/05/2017 2.60 (H) 0.61 - 1.24 mg/dL Final  12/01/2017 1.38 (H) 0.61 - 1.24 mg/dL Final  10/27/2017 1.71 (H) 0.61 - 1.24 mg/dL Final  10/26/2017 1.65 (H) 0.61 - 1.24 mg/dL Final     EKG  EKG Interpretation  Date/Time:  Tuesday December 05 2017 09:54:17 EST Ventricular Rate:  82 PR Interval:    QRS Duration: 141 QT Interval:  410 QTC Calculation: 479 R Axis:   -61 Text Interpretation:  Sinus or ectopic atrial rhythm Borderline prolonged PR interval Nonspecific IVCD with LAD LVH with secondary repolarization abnormality Anterior Q waves, possibly due to LVH since last tracing no significant change Confirmed by Daleen Bo [phone]) on 12/05/2017 1:05:34 PM       Radiology No results found.  Procedures .Critical Care Performed by: Daleen Bo, MD Authorized by: Daleen Bo, MD   Critical care provider statement:    Critical care time (minutes):  75   Critical care start time:  12/05/2017 10:00 AM   Critical care end time:  12/05/2017 3:48 PM   Critical care time was exclusive of:  Separately billable procedures and treating other patients   Critical care was necessary to treat or prevent imminent or life-threatening deterioration of the following conditions:  Circulatory failure   Critical care was time spent personally by me on the following activities:  Blood draw for specimens, development of treatment plan with patient or surrogate, discussions with consultants, evaluation of patient's response to  treatment, examination of patient, obtaining history from patient or surrogate, ordering and performing treatments and interventions, ordering and review of laboratory studies, pulse oximetry, re-evaluation of patient's condition, review of old charts and ordering and review of radiographic studies    (including critical care time)  Medications Ordered in ED Medications  famotidine (PEPCID) IVPB 20 mg in NS 100 mL IVPB (20 mg Intravenous Given 12/05/17 1614)  0.9 %  sodium chloride infusion ( Intravenous New Bag/Given 12/05/17 1321)  methylPREDNISolone sodium succinate (SOLU-MEDROL) 125 mg/2 mL injection 125 mg (125 mg Intravenous Given 12/05/17 1006)  sodium chloride 0.9 % bolus 1,000 mL (0 mLs Intravenous Stopped 12/05/17 1049)  EPINEPHrine (EPI-PEN) injection 0.3 mg (0.3 mg Intramuscular Given 12/05/17 1059)  sodium chloride 0.9 % bolus 1,000 mL (0 mLs Intravenous Stopped 12/05/17 1159)  diphenhydrAMINE (BENADRYL) capsule 25 mg (25 mg Oral Given 12/05/17 1613)     Initial Impression / Assessment and Plan / ED Course  I have reviewed the triage vital signs and the nursing notes.  Pertinent labs & imaging results that were available during my care of the patient were reviewed by me and considered in my medical decision making (see chart for details).  Clinical Course as of Dec 05 1637  Tue Dec 05, 2017  1010 Patient presenting  with allergic reaction, by EMS, status post treatment with epinephrine, and Benadryl.  He is alert, and somewhat responsive but has garbled voice.  He has a valid DNR with him.  He reportedly had allergic reaction to doxycycline 3 days ago.  Initial treatment with IV fluids and Solu-Medrol.  Manual blood pressure greater than 90 systolic.  No advanced airway will be started at this time.  [EW]  1303 Patient is hungry and thirsty. A&O x 3. MAP 55 (93/44).  [EW]  5364 Venous blood gas was ordered, a return in values with pH 7.3, and PO2 175.  An IV had been started following  aspiration of blood, and saline infused into the artery, around 125 cc.  I asked the nurse to discontinue the IV.  Blood pressure is improved at this time, and he does not require central access at this time.  [EW]    Clinical Course User Index [EW] Daleen Bo, MD     Patient Vitals for the past 24 hrs:  BP Temp Temp src Pulse Resp SpO2 Height Weight  12/05/17 1600 (!) 103/51 - - 81 17 97 % - -  12/05/17 1545 (!) 122/41 - - 82 12 100 % - -  12/05/17 1530 (!) 118/46 - - 84 - 99 % - -  12/05/17 1515 (!) 109/50 - - 81 - 98 % - -  12/05/17 1500 101/60 - - 80 - 96 % - -  12/05/17 1445 (!) 113/94 - - 81 - 97 % - -  12/05/17 1430 (!) 106/54 - - 80 18 97 % - -  12/05/17 1415 (!) 92/44 - - 82 - 97 % - -  12/05/17 1400 (!) 97/50 - - 83 - 96 % - -  12/05/17 1345 (!) 105/39 - - 82 - 95 % - -  12/05/17 1315 (!) 87/33 - - 80 13 95 % - -  12/05/17 1300 (!) 90/38 - - 82 18 96 % - -  12/05/17 1245 (!) 83/39 - - 82 15 100 % - -  12/05/17 1230 (!) 84/34 - - 84 12 100 % - -  12/05/17 1153 - 97.6 F (36.4 C) - - - - - -  12/05/17 1145 92/62 - - 89 14 100 % - -  12/05/17 1130 (!) 95/41 - - 87 13 100 % - -  12/05/17 1120 (!) 91/47 - - 88 15 100 % - -  12/05/17 1115 - - - - - 100 % - -  12/05/17 1105 105/60 - - 88 12 100 % - -  12/05/17 1100 (!) 90/46 - - 87 16 100 % - -  12/05/17 1048 (!) 81/43 - - 88 15 100 % - -  12/05/17 1030 (!) 100/41 - - 91 12 100 % - -  12/05/17 1015 (!) 100/40 - - 88 12 100 % - -  12/05/17 1005 92/68 - - - - - - -  12/05/17 1001 - - - - - - 5' 8"  (1.727 m) 102.1 kg (225 lb)  12/05/17 0957 - 98.1 F (36.7 C) Axillary - - - - -  12/05/17 0956 (!) 62/44 98.1 F (36.7 C) Oral 83 14 100 % - -    3:48 PM Reevaluation with update and discussion. After initial assessment and treatment, an updated evaluation reveals patient remains alert, comfortable, talkative and answers questions well.  Family members, sisters are here and they are updated on findings and plan. Daleen Bo    15: 55-requested callback  from critical care physician to discuss case and need for their involvement.  Discussed with Dr. Pearline Cables, intensivist he will see as needed after admission.  4:02 PM-Consult complete with hospitalist. Patient case explained and discussed.  She agrees to admit patient for further evaluation and treatment. Call ended at 58: 55  Final Clinical Impressions(s) / ED Diagnoses   Final diagnoses:  Anaphylaxis, initial encounter  AKI (acute kidney injury) ([place])   Patient presenting with anaphylaxis, rash and suspected etiology antibiotic, doxycycline.  He is being treated for wound infection of the left ankle.  Patient with moderate acute kidney injury, creatinine doubled, BUN elevated, and potassium elevated.  Hemoglobin somewhat low at 11.2.  Arterial blood gas obtained with pH 7.3 PCO2 27, PO2 175.  White blood cell count elevated to 20.  Patient will require admission for monitoring and stabilization.  Nursing Notes Reviewed/ Care Coordinated Applicable Imaging Reviewed Interpretation of Laboratory Data incorporated into ED treatment   Plan: [place]   ED Discharge Orders    None       Daleen Bo, MD 12/05/17 1658

## 2017-12-05 NOTE — ED Notes (Signed)
Hospitalist at bedside 

## 2017-12-05 NOTE — ED Notes (Signed)
Water requested and given per MD order.  Patient tolerated well.

## 2017-12-05 NOTE — ED Notes (Signed)
Patient states that his "pills" fell on the floor and RN should pick them up. Patient is re-oriented and told that his medicines did not fall on the floor. Patient states "I can see them, I just dropped them" Patient is reminded that he has been asleep and did not handle any on his meds this morning.

## 2017-12-05 NOTE — ED Triage Notes (Signed)
Per [facility], Pt is coming from [place] home where he was given Doxycycline on Friday due to a surgery on his left leg with wound vac placed on Thursday. Pt had reaction and was immediately stopped taking medication. The reaction continued and patient has erythema and rash noted to entire body. When EMS arrived today, pt was unresponsive. Pt is responsive to voice at this time. Had to have nasal trumpet placed during transport, but was taken out by patient.   Vitals per EMS: 90/43, 87 HR,   0.3 Epi Given on Scene, 50 mg of Benadryl  Baseline: Alert and Oriented x4.

## 2017-12-05 NOTE — ED Notes (Signed)
ED Provider at bedside. 

## 2017-12-05 NOTE — ED Notes (Signed)
Patient's brother is in the room. He is calling patient's sisters to notify of room move

## 2017-12-06 ENCOUNTER — Telehealth (INDEPENDENT_AMBULATORY_CARE_PROVIDER_SITE_OTHER): Payer: Self-pay

## 2017-12-06 ENCOUNTER — Encounter (HOSPITAL_COMMUNITY): Payer: Self-pay | Admitting: General Practice

## 2017-12-06 DIAGNOSIS — T847XXD Infection and inflammatory reaction due to other internal orthopedic prosthetic devices, implants and grafts, subsequent encounter: Secondary | ICD-10-CM | POA: Diagnosis not present

## 2017-12-06 DIAGNOSIS — H3530 Unspecified macular degeneration: Secondary | ICD-10-CM | POA: Diagnosis present

## 2017-12-06 DIAGNOSIS — Z9049 Acquired absence of other specified parts of digestive tract: Secondary | ICD-10-CM | POA: Diagnosis not present

## 2017-12-06 DIAGNOSIS — T380X5A Adverse effect of glucocorticoids and synthetic analogues, initial encounter: Secondary | ICD-10-CM | POA: Diagnosis present

## 2017-12-06 DIAGNOSIS — N179 Acute kidney failure, unspecified: Secondary | ICD-10-CM | POA: Diagnosis not present

## 2017-12-06 DIAGNOSIS — E0821 Diabetes mellitus due to underlying condition with diabetic nephropathy: Secondary | ICD-10-CM | POA: Diagnosis not present

## 2017-12-06 DIAGNOSIS — I2510 Atherosclerotic heart disease of native coronary artery without angina pectoris: Secondary | ICD-10-CM | POA: Diagnosis not present

## 2017-12-06 DIAGNOSIS — Z955 Presence of coronary angioplasty implant and graft: Secondary | ICD-10-CM | POA: Diagnosis not present

## 2017-12-06 DIAGNOSIS — R4182 Altered mental status, unspecified: Secondary | ICD-10-CM | POA: Diagnosis not present

## 2017-12-06 DIAGNOSIS — R2681 Unsteadiness on feet: Secondary | ICD-10-CM | POA: Diagnosis not present

## 2017-12-06 DIAGNOSIS — E1122 Type 2 diabetes mellitus with diabetic chronic kidney disease: Secondary | ICD-10-CM | POA: Diagnosis not present

## 2017-12-06 DIAGNOSIS — K5190 Ulcerative colitis, unspecified, without complications: Secondary | ICD-10-CM | POA: Diagnosis not present

## 2017-12-06 DIAGNOSIS — M6281 Muscle weakness (generalized): Secondary | ICD-10-CM | POA: Diagnosis not present

## 2017-12-06 DIAGNOSIS — R05 Cough: Secondary | ICD-10-CM | POA: Diagnosis not present

## 2017-12-06 DIAGNOSIS — G629 Polyneuropathy, unspecified: Secondary | ICD-10-CM | POA: Diagnosis not present

## 2017-12-06 DIAGNOSIS — S82842D Displaced bimalleolar fracture of left lower leg, subsequent encounter for closed fracture with routine healing: Secondary | ICD-10-CM | POA: Diagnosis not present

## 2017-12-06 DIAGNOSIS — S82852D Displaced trimalleolar fracture of left lower leg, subsequent encounter for closed fracture with routine healing: Secondary | ICD-10-CM | POA: Diagnosis not present

## 2017-12-06 DIAGNOSIS — N183 Chronic kidney disease, stage 3 (moderate): Secondary | ICD-10-CM | POA: Diagnosis not present

## 2017-12-06 DIAGNOSIS — T7840XD Allergy, unspecified, subsequent encounter: Secondary | ICD-10-CM | POA: Diagnosis not present

## 2017-12-06 DIAGNOSIS — I130 Hypertensive heart and chronic kidney disease with heart failure and stage 1 through stage 4 chronic kidney disease, or unspecified chronic kidney disease: Secondary | ICD-10-CM | POA: Diagnosis not present

## 2017-12-06 DIAGNOSIS — Z933 Colostomy status: Secondary | ICD-10-CM | POA: Diagnosis not present

## 2017-12-06 DIAGNOSIS — E785 Hyperlipidemia, unspecified: Secondary | ICD-10-CM | POA: Diagnosis not present

## 2017-12-06 DIAGNOSIS — X58XXXD Exposure to other specified factors, subsequent encounter: Secondary | ICD-10-CM | POA: Diagnosis not present

## 2017-12-06 DIAGNOSIS — Z66 Do not resuscitate: Secondary | ICD-10-CM | POA: Diagnosis present

## 2017-12-06 DIAGNOSIS — T50901A Poisoning by unspecified drugs, medicaments and biological substances, accidental (unintentional), initial encounter: Secondary | ICD-10-CM | POA: Diagnosis not present

## 2017-12-06 DIAGNOSIS — E1121 Type 2 diabetes mellitus with diabetic nephropathy: Secondary | ICD-10-CM | POA: Diagnosis present

## 2017-12-06 DIAGNOSIS — T782XXA Anaphylactic shock, unspecified, initial encounter: Secondary | ICD-10-CM | POA: Diagnosis not present

## 2017-12-06 DIAGNOSIS — R278 Other lack of coordination: Secondary | ICD-10-CM | POA: Diagnosis not present

## 2017-12-06 DIAGNOSIS — M79609 Pain in unspecified limb: Secondary | ICD-10-CM | POA: Diagnosis not present

## 2017-12-06 DIAGNOSIS — I5043 Acute on chronic combined systolic (congestive) and diastolic (congestive) heart failure: Secondary | ICD-10-CM | POA: Diagnosis not present

## 2017-12-06 DIAGNOSIS — M6389 Disorders of muscle in diseases classified elsewhere, multiple sites: Secondary | ICD-10-CM | POA: Diagnosis not present

## 2017-12-06 DIAGNOSIS — D649 Anemia, unspecified: Secondary | ICD-10-CM | POA: Diagnosis not present

## 2017-12-06 DIAGNOSIS — L271 Localized skin eruption due to drugs and medicaments taken internally: Secondary | ICD-10-CM | POA: Diagnosis not present

## 2017-12-06 DIAGNOSIS — Z6834 Body mass index (BMI) 34.0-34.9, adult: Secondary | ICD-10-CM | POA: Diagnosis not present

## 2017-12-06 DIAGNOSIS — I5042 Chronic combined systolic (congestive) and diastolic (congestive) heart failure: Secondary | ICD-10-CM | POA: Diagnosis not present

## 2017-12-06 DIAGNOSIS — D72829 Elevated white blood cell count, unspecified: Secondary | ICD-10-CM | POA: Diagnosis not present

## 2017-12-06 DIAGNOSIS — T364X5A Adverse effect of tetracyclines, initial encounter: Secondary | ICD-10-CM | POA: Diagnosis not present

## 2017-12-06 DIAGNOSIS — Z978 Presence of other specified devices: Secondary | ICD-10-CM | POA: Diagnosis not present

## 2017-12-06 DIAGNOSIS — T886XXA Anaphylactic reaction due to adverse effect of correct drug or medicament properly administered, initial encounter: Secondary | ICD-10-CM | POA: Diagnosis not present

## 2017-12-06 DIAGNOSIS — Z8719 Personal history of other diseases of the digestive system: Secondary | ICD-10-CM | POA: Diagnosis not present

## 2017-12-06 DIAGNOSIS — I952 Hypotension due to drugs: Secondary | ICD-10-CM | POA: Diagnosis present

## 2017-12-06 DIAGNOSIS — E875 Hyperkalemia: Secondary | ICD-10-CM | POA: Diagnosis present

## 2017-12-06 DIAGNOSIS — I480 Paroxysmal atrial fibrillation: Secondary | ICD-10-CM | POA: Diagnosis not present

## 2017-12-06 DIAGNOSIS — T368X5A Adverse effect of other systemic antibiotics, initial encounter: Secondary | ICD-10-CM | POA: Diagnosis not present

## 2017-12-06 DIAGNOSIS — R41841 Cognitive communication deficit: Secondary | ICD-10-CM | POA: Diagnosis not present

## 2017-12-06 DIAGNOSIS — E669 Obesity, unspecified: Secondary | ICD-10-CM | POA: Diagnosis present

## 2017-12-06 DIAGNOSIS — E872 Acidosis: Secondary | ICD-10-CM | POA: Diagnosis not present

## 2017-12-06 DIAGNOSIS — G9341 Metabolic encephalopathy: Secondary | ICD-10-CM | POA: Diagnosis not present

## 2017-12-06 LAB — BASIC METABOLIC PANEL
Anion gap: 9 (ref 5–15)
BUN: 84 mg/dL — ABNORMAL HIGH (ref 6–20)
CO2: 16 mmol/L — ABNORMAL LOW (ref 22–32)
Calcium: 7.7 mg/dL — ABNORMAL LOW (ref 8.9–10.3)
Chloride: 107 mmol/L (ref 101–111)
Creatinine, Ser: 2.2 mg/dL — ABNORMAL HIGH (ref 0.61–1.24)
GFR calc Af Amer: 30 mL/min — ABNORMAL LOW (ref >60)
GFR calc non Af Amer: 26 mL/min — ABNORMAL LOW (ref >60)
Glucose, Bld: 217 mg/dL — ABNORMAL HIGH (ref 65–99)
Potassium: 5.8 mmol/L — ABNORMAL HIGH (ref 3.5–5.1)
Sodium: 132 mmol/L — ABNORMAL LOW (ref 135–145)

## 2017-12-06 LAB — CBC
HCT: 33.3 % — ABNORMAL LOW (ref 39.0–52.0)
Hemoglobin: 10.8 g/dL — ABNORMAL LOW (ref 13.0–17.0)
MCH: 29.9 pg (ref 26.0–34.0)
MCHC: 32.4 g/dL (ref 30.0–36.0)
MCV: 92.2 fL (ref 78.0–100.0)
Platelets: 260 10*3/uL (ref 150–400)
RBC: 3.61 MIL/uL — ABNORMAL LOW (ref 4.22–5.81)
RDW: 14 % (ref 11.5–15.5)
WBC: 18.1 10*3/uL — ABNORMAL HIGH (ref 4.0–10.5)

## 2017-12-06 LAB — GLUCOSE, CAPILLARY
Glucose-Capillary: 222 mg/dL — ABNORMAL HIGH (ref 65–99)
Glucose-Capillary: 215 mg/dL — ABNORMAL HIGH (ref 65–99)
Glucose-Capillary: 138 mg/dL — ABNORMAL HIGH (ref 65–99)

## 2017-12-06 LAB — MRSA PCR SCREENING: MRSA by PCR: POSITIVE — AB

## 2017-12-06 MED ORDER — SODIUM CHLORIDE 0.9 % IV SOLN
INTRAVENOUS | Status: DC
  Administered 2017-12-06 (×2): via INTRAVENOUS

## 2017-12-06 MED ORDER — MUPIROCIN 2 % EX OINT
TOPICAL_OINTMENT | Freq: Two times a day (BID) | CUTANEOUS | Status: DC
  Administered 2017-12-06 – 2017-12-13 (×14): via NASAL
  Filled 2017-12-06: qty 22

## 2017-12-06 MED ORDER — METHYLPREDNISOLONE SODIUM SUCC 40 MG IJ SOLR
40.0000 mg | Freq: Two times a day (BID) | INTRAMUSCULAR | Status: DC
  Administered 2017-12-06 – 2017-12-07 (×2): 40 mg via INTRAVENOUS
  Filled 2017-12-06 (×2): qty 1

## 2017-12-06 MED ORDER — INSULIN GLARGINE 100 UNIT/ML ~~LOC~~ SOLN
10.0000 [IU] | Freq: Every day | SUBCUTANEOUS | Status: AC
  Administered 2017-12-06: 10 [IU] via SUBCUTANEOUS
  Filled 2017-12-06: qty 0.1

## 2017-12-06 NOTE — Telephone Encounter (Signed)
Call patient's family I will see him in the hospital tomorrow.

## 2017-12-06 NOTE — Progress Notes (Signed)
PROGRESS NOTE    [name] [name]  MIW:[id number] DOB: 11-20-1932 DOA: 12/05/2017 PCP: [name], MD  Brief Narrative:[name] [name] is a 84 y.o. male with medical history significant of UC s/p total colectomy; afib not on AC; HTN; HLD; DM; stage 3 CKD; and chronic diastolic CHF presenting with AMS.  Patient had a wound dehiscence of his left ankle with hardware removal by Dr. Sharol Given on 2/15; a Praveena wound vac was placed.  He was returned to [facility] on Doxycycline.  The medication was changed due to development of a rash and he was then started on Clindamycin.  The brother reports that the patient looked normal last night, but this AM he had diffuse edema of the face and body as well as diffuse erythema.  He was also quite altered Admitted with Anaphylaxis and AKI, he was hypotensive in ED, started on IVF, steroids, benadryl, Pepcid  Assessment & Plan:   Allergic reaction/Anaphylaxis -Admitted with diffuse drug rash, hypotension concerning for anaphylaxis secondary to doxycycline and/or clindamycin -Clinically improving, blood pressure better but still soft in the 90s, continue IV fluids today will cut down rate -Continue IV steroids, decrease dose -No evidence of SJS  Hypotension -Patient became profoundly hypotensive while in the ER (he was 56/34 on arrival to ED -improving, 90s now, continue IVF today -cut down IV steroids  Acute renal failure on CKD3 with metabolic acidosis -Most likely secondary to allergic reaction and hypotension -Continue IV fluids, cut down rate, change fluids to normal saline at 75 mL an hour  Wound infection -Admitting M.D. Dr. Lorin Mercy discussed case with Dr. Sharol Given -While previously recommended for the patient to remain on antibiotics, his foot appears to be healing well with the wound vac in place; no evidence of acute infectious process at this time, agree with plan to stop antibiotics and monitor this time -Wound vac will remain in place; CAM walker is  on; and the patient is non-weight bearing.  P.Afib -Rate control with Amiodarone -chadsvasc score >, not on anticoagulation at baseline -in NSR at this time  Chronic systolic and diastolic CHF -Clinically compensated, diuretics on hold, blood pressure still soft -EF 40-45% with grade 1 diastolic dysfunction in 2/50  DM -Actos and Onglyza on hold -continue SSI, add low dose lantus while on steroids -A1c in 3/18 was 6.5, will recheck  DVT prophylaxis:  Lovenox Code Status:  DNR - Family Communication: no family at bedside Disposition Plan:  Back to SNF once clinically improved   Consultants:   Admitting MD d/w Dr.Duda and PCCM   Procedures:   Antimicrobials:    Subjective: -feels ok, still has diffuse rash -no difficulty breathing or swallowing  Objective: Vitals:   12/05/17 1948 12/06/17 0046 12/06/17 0348 12/06/17 1146  BP: 90/64 (!) 113/46 101/73 (!) 92/42  Pulse: (!) 119 82 80 79  Resp: 18 14 (!) 21   Temp: 97.9 F (36.6 C) 97.7 F (36.5 C) 97.8 F (36.6 C)   TempSrc: Oral Oral Oral   SpO2: (!) 81% 100% 99% 96%  Weight:      Height:        Intake/Output Summary (Last 24 hours) at 12/06/2017 1313 Last data filed at 12/06/2017 0701 Gross per 24 hour  Intake 871.25 ml  Output 550 ml  Net 321.25 ml   Filed Weights   12/05/17 1001  Weight: 102.1 kg (225 lb)    Examination:  General exam: Appears calm and comfortable, obese elderly chronically ill-appearing male Respiratory system: Clear  bilaterally, no stridor Cardiovascular system: S1 & S2 heard, RRR. No JVD, murmurs, rubs, gallops  Gastrointestinal system: Abdomen is nondistended, soft and nontender.Normal bowel sounds heard. Central nervous system: Alert and oriented. No focal neurological deficits. Extremities: Left foot bandaged, wound VAC noted, minimal drainage distal toes without significant erythema Skin: Diffuse erythema involving face chest arms and legs, no mucosal  involvement Psychiatry: Judgement and insight appear normal. Mood & affect appropriate.     Data Reviewed:   CBC: Recent Labs  Lab 12/01/17 0936 12/05/17 1318 12/05/17 1506 12/06/17 0358  WBC 7.1 20.0*  --  18.1*  NEUTROABS  --  16.2*  --   --   HGB 10.4* 10.7* 11.2* 10.8*  HCT 32.5* 32.2* 33.0* 33.3*  MCV 97.0 93.9  --  92.2  PLT 250 202  --  502   Basic Metabolic Panel: Recent Labs  Lab 12/01/17 0936 12/05/17 1506 12/06/17 0358  NA 138 132* 132*  K 5.4* 6.0* 5.8*  CL 109 105 107  CO2 19*  --  16*  GLUCOSE 130* 184* 217*  BUN 43* 73* 84*  CREATININE 1.38* 2.60* 2.20*  CALCIUM 8.3*  --  7.7*   GFR: Estimated Creatinine Clearance: 30 mL/min (A) (by C-G formula based on SCr of 2.2 mg/dL (H)). Liver Function Tests: No results for input(s): AST, ALT, ALKPHOS, BILITOT, PROT, ALBUMIN in the last 168 hours. No results for input(s): LIPASE, AMYLASE in the last 168 hours. No results for input(s): AMMONIA in the last 168 hours. Coagulation Profile: No results for input(s): INR, PROTIME in the last 168 hours. Cardiac Enzymes: No results for input(s): CKTOTAL, CKMB, CKMBINDEX, TROPONINI in the last 168 hours. BNP (last 3 results) No results for input(s): PROBNP in the last 8760 hours. HbA1C: Recent Labs    12/05/17 1318  HGBA1C 6.4*   CBG: Recent Labs  Lab 12/01/17 1142 12/01/17 1316 12/05/17 2116 12/06/17 0615 12/06/17 1143  GLUCAP 114* 117* 205* 222* 215*   Lipid Profile: No results for input(s): CHOL, HDL, LDLCALC, TRIG, CHOLHDL, LDLDIRECT in the last 72 hours. Thyroid Function Tests: No results for input(s): TSH, T4TOTAL, FREET4, T3FREE, THYROIDAB in the last 72 hours. Anemia Panel: No results for input(s): VITAMINB12, FOLATE, FERRITIN, TIBC, IRON, RETICCTPCT in the last 72 hours. Urine analysis:    Component Value Date/Time   COLORURINE STRAW (A) 01/04/2017 1229   APPEARANCEUR CLEAR 01/04/2017 1229   LABSPEC 1.012 01/04/2017 1229   PHURINE 5.0  01/04/2017 1229   GLUCOSEU NEGATIVE 01/04/2017 1229   HGBUR NEGATIVE 01/04/2017 1229   BILIRUBINUR NEGATIVE 01/04/2017 1229   KETONESUR NEGATIVE 01/04/2017 1229   PROTEINUR NEGATIVE 01/04/2017 1229   UROBILINOGEN 0.2 10/22/2014 1052   NITRITE NEGATIVE 01/04/2017 1229   LEUKOCYTESUR NEGATIVE 01/04/2017 1229   Sepsis Labs: @LABRCNTIP (procalcitonin:4,lacticidven:4)  ) Recent Results (from the past 240 hour(s))  MRSA PCR Screening     Status: Abnormal   Collection Time: 12/05/17 10:14 PM  Result Value Ref Range Status   MRSA by PCR POSITIVE (A) NEGATIVE Final    Comment:        The GeneXpert MRSA Assay (FDA approved for NASAL specimens only), is one component of a comprehensive MRSA colonization surveillance program. It is not intended to diagnose MRSA infection nor to guide or monitor treatment for MRSA infections. RESULT CALLED TO, READ BACK BY AND VERIFIED WITHBelenda Cruise RN 12/06/17 0319 JDW Performed at [facility], [address] [place], [place] [address]  Radiology Studies: No results found.      Scheduled Meds: . amiodarone  200 mg Oral Daily  . aspirin EC  81 mg Oral Daily  . buPROPion  75 mg Oral Daily  . docusate sodium  100 mg Oral BID  . enoxaparin (LOVENOX) injection  30 mg Subcutaneous Q24H  . famotidine (PEPCID) IV  20 mg Intravenous Q12H  . ferrous sulfate  325 mg Oral BID WC  . insulin aspart  0-15 Units Subcutaneous TID WC  . insulin aspart  0-5 Units Subcutaneous QHS  . insulin glargine  10 Units Subcutaneous QHS  . methylPREDNISolone (SOLU-MEDROL) injection  40 mg Intravenous Q12H  . simvastatin  20 mg Oral QHS  . sodium chloride flush  3 mL Intravenous Q12H   Continuous Infusions: . sodium chloride 75 mL/hr at 12/06/17 0837     LOS: 0 days    Time spent: 81mn    PDomenic Polite MD [facility] Page via [email or web address], password [email or web address] After 7PM please contact night-coverage  12/06/2017, 1:13 PM

## 2017-12-06 NOTE — Telephone Encounter (Signed)
Pt's sister called and states that pt had surgery Friday and was allergic to the ABX that he was given and was sent back to the hospital. He is in [phone] and wants you to order xrays of his leg and see him while he is there or coordinate his care being that he was to have his first post op appt in the office in Thursday and is in the hospital and can not make it.

## 2017-12-06 NOTE — Progress Notes (Signed)
Inpatient Diabetes Program Recommendations  AACE/ADA: New Consensus Statement on Inpatient Glycemic Control (2015)  Target Ranges:  Prepandial:   less than 140 mg/dL      Peak postprandial:   less than 180 mg/dL (1-2 hours)      Critically ill patients:  140 - 180 mg/dL   Lab Results  Component Value Date   GLUCAP 222 (H) 12/06/2017   HGBA1C 6.4 (H) 12/05/2017    Review of Glycemic Control Results for [name] [name], [name] [name] (MRN [id number]) as of 12/06/2017 11:37  Ref. Range 12/01/2017 13:16 12/05/2017 21:16 12/06/2017 06:15  Glucose-Capillary Latest Ref Range: 65 - 99 mg/dL 117 (H) 205 (H) 222 (H)   Diabetes history: Type 2 DM Outpatient Diabetes medications: Actos 30 mg QD, Onglyza 5 mg QD Current orders for Inpatient glycemic control: Novolog 0-15 Units TID, Novolog 0-5 Units QHS  Inpatient Diabetes Program Recommendations:    Am FSBS was 222 mg/dL, elevated most likely from Solumedrol 60 mg Q6H. While on steroids, consider Lantus 8 Units QHS, then taper as steroids are decreased.   Thanks, Bronson Curb, MSN, RNC-OB Diabetes Coordinator [phone] (8a-5p)

## 2017-12-07 ENCOUNTER — Encounter (INDEPENDENT_AMBULATORY_CARE_PROVIDER_SITE_OTHER): Payer: Self-pay | Admitting: Orthopedic Surgery

## 2017-12-07 ENCOUNTER — Inpatient Hospital Stay (INDEPENDENT_AMBULATORY_CARE_PROVIDER_SITE_OTHER): Payer: Medicare Other | Admitting: Orthopedic Surgery

## 2017-12-07 LAB — BASIC METABOLIC PANEL
Anion gap: 9 (ref 5–15)
BUN: 78 mg/dL — ABNORMAL HIGH (ref 6–20)
CO2: 17 mmol/L — ABNORMAL LOW (ref 22–32)
Calcium: 7.7 mg/dL — ABNORMAL LOW (ref 8.9–10.3)
Chloride: 108 mmol/L (ref 101–111)
Creatinine, Ser: 1.85 mg/dL — ABNORMAL HIGH (ref 0.61–1.24)
GFR calc Af Amer: 37 mL/min — ABNORMAL LOW (ref >60)
GFR calc non Af Amer: 32 mL/min — ABNORMAL LOW (ref >60)
Glucose, Bld: 155 mg/dL — ABNORMAL HIGH (ref 65–99)
Potassium: 5.3 mmol/L — ABNORMAL HIGH (ref 3.5–5.1)
Sodium: 134 mmol/L — ABNORMAL LOW (ref 135–145)

## 2017-12-07 LAB — CBC
HCT: 29.5 % — ABNORMAL LOW (ref 39.0–52.0)
Hemoglobin: 9.8 g/dL — ABNORMAL LOW (ref 13.0–17.0)
MCH: 30.3 pg (ref 26.0–34.0)
MCHC: 33.2 g/dL (ref 30.0–36.0)
MCV: 91.3 fL (ref 78.0–100.0)
Platelets: 244 10*3/uL (ref 150–400)
RBC: 3.23 MIL/uL — ABNORMAL LOW (ref 4.22–5.81)
RDW: 14.1 % (ref 11.5–15.5)
WBC: 18.8 10*3/uL — ABNORMAL HIGH (ref 4.0–10.5)

## 2017-12-07 LAB — GLUCOSE, CAPILLARY
Glucose-Capillary: 172 mg/dL — ABNORMAL HIGH (ref 65–99)
Glucose-Capillary: 141 mg/dL — ABNORMAL HIGH (ref 65–99)
Glucose-Capillary: 232 mg/dL — ABNORMAL HIGH (ref 65–99)
Glucose-Capillary: 218 mg/dL — ABNORMAL HIGH (ref 65–99)
Glucose-Capillary: 119 mg/dL — ABNORMAL HIGH (ref 65–99)

## 2017-12-07 MED ORDER — SODIUM CHLORIDE 0.9 % IV SOLN: INTRAVENOUS | Status: AC

## 2017-12-07 MED ORDER — METHYLPREDNISOLONE SODIUM SUCC 40 MG IJ SOLR
40.0000 mg | Freq: Two times a day (BID) | INTRAMUSCULAR | Status: AC
  Administered 2017-12-07: 40 mg via INTRAVENOUS
  Filled 2017-12-07: qty 1

## 2017-12-07 MED ORDER — ENOXAPARIN SODIUM 40 MG/0.4ML ~~LOC~~ SOLN
40.0000 mg | SUBCUTANEOUS | Status: DC
  Administered 2017-12-07 – 2017-12-11 (×5): 40 mg via SUBCUTANEOUS
  Filled 2017-12-07 (×5): qty 0.4

## 2017-12-07 MED ORDER — PREDNISONE 20 MG PO TABS
40.0000 mg | ORAL_TABLET | Freq: Every day | ORAL | Status: DC
  Administered 2017-12-08: 40 mg via ORAL
  Filled 2017-12-07: qty 2

## 2017-12-07 NOTE — Plan of Care (Signed)
  Progressing Education: Knowledge of General Education information will improve 12/07/2017 0006 - Progressing by Peggye Pitt, RN Coping: Level of anxiety will decrease 12/07/2017 0006 - Progressing by Peggye Pitt, RN   Completed/Met Nutrition: Adequate nutrition will be maintained 12/07/2017 0006 - Completed/Met by Peggye Pitt, RN

## 2017-12-07 NOTE — Progress Notes (Signed)
PROGRESS NOTE    [name] [name]  IHK:[id number] DOB: Jul 29, 1933 DOA: 12/05/2017 PCP: [name], MD  Brief Narrative:[name] [name] is a 84 y.o. male with medical history significant of UC s/p total colectomy; afib not on AC; HTN; HLD; DM; stage 3 CKD; and chronic diastolic CHF presenting with AMS.  Patient had a wound dehiscence of his left ankle with hardware removal by Dr. Sharol Given on 2/15; a Praveena wound vac was placed.  He was returned to [facility] on Doxycycline.  The medication was changed due to development of a rash and he was then started on Clindamycin.  The brother reports that the patient looked normal last night, but this AM he had diffuse edema of the face and body as well as diffuse erythema.  He was also quite altered Admitted with Anaphylaxis and AKI, he was hypotensive in ED, started on IVF, steroids, benadryl, Pepcid  Assessment & Plan:   Allergic reaction/Anaphylaxis -Admitted with diffuse drug rash, hypotension concerning for anaphylaxis secondary to doxycycline and/or clindamycin -Clinically improving, blood pressure finally better, cut down IV fluids -Continue IV steroids, decrease dose and transition to quick steroid taper -No evidence of SJS  Hypotension -Patient became profoundly hypotensive while in the ER (he was 56/34 on arrival to ED -improving, 120s now, stop fluids after 6hours  Acute renal failure on CKD3 with metabolic acidosis, hyperkalemia -Most likely secondary to allergic reaction and hypotension\ -Creatinine improving, cut down fluids to 50 mL an hour for 6 more hours, baseline creatinine around 1.4-1.5  Wound dehiscence of his left ankle -Status post hardware removal by Dr. Sharol Given on 2/15 and wound VAC placement -Admitting M.D. Dr. Lorin Mercy discussed case with Dr. Sharol Given -While previously recommended for the patient to remain on antibiotics, his foot appears to be healing well with the wound vac in place; no evidence of acute infectious process at  this time, agree with plan to stop antibiotics and monitor this time -Continue wound VAC, CAM walker is on; and the patient is non-weight bearing, seen by Dr. Sharol Given this morning, recommended to continue elevation and strict nonweightbearing   P.Afib -Rate control with Amiodarone -chadsvasc score >, not on anticoagulation at baseline -in NSR at this time  Chronic systolic and diastolic CHF -Clinically compensated, diuretics on hold, blood pressure still soft -EF 40-45% with grade 1 diastolic dysfunction in 7/56 -Diuretics on hold -IV fluids for 6 more hours  DM -Actos and Onglyza on hold -continue SSI, continue low dose lantus while on steroids -A1c in 3/18 was 6.5, will recheck  DVT prophylaxis:  Lovenox Code Status:  DNR - Family Communication: no family at bedside Disposition Plan:  Back to SNF once clinically improved, possibly tomorrow   Consultants:   Orthopedics Dr. Sharol Given   Procedures:   Antimicrobials:    Subjective: -feels ok, still has diffuse rash -no difficulty breathing or swallowing  Objective: Vitals:   12/07/17 0052 12/07/17 0419 12/07/17 0900 12/07/17 1100  BP: (!) 112/48 (!) 90/47 (!) 131/51 129/63  Pulse: 89 80 86 82  Resp: 17 17    Temp: 97.6 F (36.4 C) (!) 97.3 F (36.3 C)    TempSrc: Oral Oral    SpO2:      Weight:      Height:        Intake/Output Summary (Last 24 hours) at 12/07/2017 1359 Last data filed at 12/07/2017 0424 Gross per 24 hour  Intake 1616.25 ml  Output 1450 ml  Net 166.25 ml   [employer]  12/05/17 1001  Weight: 102.1 kg (225 lb)    Examination:  Gen: Elderly, frail chronically ill obese male,Awake, Alert, Oriented X 3,  HEENT:  no JVD Lungs: Good air movement bilaterally, CTAB CVS: S1-S2/regular rate rhythm Lungs: Decreased breath sounds both bases Abd: soft, Non tender, non distended, BS present Extremities: Trace edema, left foot bandaged with wound VAC, minimal drainage Skin: Diffuse mild erythema  involving face chest arms, no mucosal involvement Psychiatry: Judgement and insight appear normal. Mood & affect appropriate.     Data Reviewed:   CBC: Recent Labs  Lab 12/01/17 0936 12/05/17 1318 12/05/17 1506 12/06/17 0358 12/07/17 0556  WBC 7.1 20.0*  --  18.1* 18.8*  NEUTROABS  --  16.2*  --   --   --   HGB 10.4* 10.7* 11.2* 10.8* 9.8*  HCT 32.5* 32.2* 33.0* 33.3* 29.5*  MCV 97.0 93.9  --  92.2 91.3  PLT 250 202  --  260 784   Basic Metabolic Panel: Recent Labs  Lab 12/01/17 0936 12/05/17 1506 12/06/17 0358 12/07/17 0556  NA 138 132* 132* 134*  K 5.4* 6.0* 5.8* 5.3*  CL 109 105 107 108  CO2 19*  --  16* 17*  GLUCOSE 130* 184* 217* 155*  BUN 43* 73* 84* 78*  CREATININE 1.38* 2.60* 2.20* 1.85*  CALCIUM 8.3*  --  7.7* 7.7*   GFR: Estimated Creatinine Clearance: 35.7 mL/min (A) (by C-G formula based on SCr of 1.85 mg/dL (H)). Liver Function Tests: No results for input(s): AST, ALT, ALKPHOS, BILITOT, PROT, ALBUMIN in the last 168 hours. No results for input(s): LIPASE, AMYLASE in the last 168 hours. No results for input(s): AMMONIA in the last 168 hours. Coagulation Profile: No results for input(s): INR, PROTIME in the last 168 hours. Cardiac Enzymes: No results for input(s): CKTOTAL, CKMB, CKMBINDEX, TROPONINI in the last 168 hours. BNP (last 3 results) No results for input(s): PROBNP in the last 8760 hours. HbA1C: Recent Labs    12/05/17 1318  HGBA1C 6.4*   CBG: Recent Labs  Lab 12/06/17 1143 12/06/17 1615 12/06/17 2133 12/07/17 0619 12/07/17 1157  GLUCAP 215* 138* 172* 141* 232*   Lipid Profile: No results for input(s): CHOL, HDL, LDLCALC, TRIG, CHOLHDL, LDLDIRECT in the last 72 hours. Thyroid Function Tests: No results for input(s): TSH, T4TOTAL, FREET4, T3FREE, THYROIDAB in the last 72 hours. Anemia Panel: No results for input(s): VITAMINB12, FOLATE, FERRITIN, TIBC, IRON, RETICCTPCT in the last 72 hours. Urine analysis:    Component Value  Date/Time   COLORURINE STRAW (A) 01/04/2017 1229   APPEARANCEUR CLEAR 01/04/2017 1229   LABSPEC 1.012 01/04/2017 1229   PHURINE 5.0 01/04/2017 1229   GLUCOSEU NEGATIVE 01/04/2017 1229   HGBUR NEGATIVE 01/04/2017 1229   BILIRUBINUR NEGATIVE 01/04/2017 1229   KETONESUR NEGATIVE 01/04/2017 1229   PROTEINUR NEGATIVE 01/04/2017 1229   UROBILINOGEN 0.2 10/22/2014 1052   NITRITE NEGATIVE 01/04/2017 1229   LEUKOCYTESUR NEGATIVE 01/04/2017 1229   Sepsis Labs: @LABRCNTIP (procalcitonin:4,lacticidven:4)  ) Recent Results (from the past 240 hour(s))  MRSA PCR Screening     Status: Abnormal   Collection Time: 12/05/17 10:14 PM  Result Value Ref Range Status   MRSA by PCR POSITIVE (A) NEGATIVE Final    Comment:        The GeneXpert MRSA Assay (FDA approved for NASAL specimens only), is one component of a comprehensive MRSA colonization surveillance program. It is not intended to diagnose MRSA infection nor to guide or monitor treatment for MRSA infections. RESULT CALLED  TO, READ BACK BY AND VERIFIED WITHBelenda Cruise RN 12/06/17 0319 JDW Performed at [facility], [address], [place], [place] [address]          Radiology Studies: No results found.      Scheduled Meds: . amiodarone  200 mg Oral Daily  . aspirin EC  81 mg Oral Daily  . buPROPion  75 mg Oral Daily  . docusate sodium  100 mg Oral BID  . enoxaparin (LOVENOX) injection  40 mg Subcutaneous Q24H  . famotidine (PEPCID) IV  20 mg Intravenous Q12H  . ferrous sulfate  325 mg Oral BID WC  . insulin aspart  0-15 Units Subcutaneous TID WC  . insulin aspart  0-5 Units Subcutaneous QHS  . methylPREDNISolone (SOLU-MEDROL) injection  40 mg Intravenous Q12H  . mupirocin ointment   Nasal BID  . simvastatin  20 mg Oral QHS  . sodium chloride flush  3 mL Intravenous Q12H   Continuous Infusions: . sodium chloride 50 mL/hr at 12/07/17 0922     LOS: 1 day    Time spent: 26mn    PDomenic Polite MD [facility] Page via [email or web address], password [email or web address] After 7PM please contact night-coverage  12/07/2017, 1:59 PM

## 2017-12-07 NOTE — Progress Notes (Signed)
Post-Op Visit Note   Patient: [name] [name]           Date of Birth: 01/09/1933           MRN: [id number] Visit Date: 11/09/2017 PCP: [name], MD  Chief Complaint:  Chief Complaint  Patient presents with  . Left Ankle - Routine Post Op    10/25/17 ORIF Left Ankle ~2 weeks post op    HPI:  HPI The patient is a 84 year old gentleman seen 2 weeks status post ORIF trimalleolar ankle fracture.  Ortho Exam Incisions are well approximated with sutures. Moderate swelling and ecchymosis to lower extremity. No erythema, drainage or sign of infection.  Visit Diagnoses:  1. Closed displaced trimalleolar fracture of left lower leg with routine healing     Plan: follow up in office in 2 weeks for radiographs. Continue daily dial soap cleansing. Apply dry dressings. Elevate for swelling. No weight bearing on left.   Follow-Up Instructions: No Follow-up on file.   Imaging: No results found.  Orders:  Orders Placed This Encounter  Procedures  . XR Ankle Complete Left   No orders of the defined types were placed in this encounter.    PMFS History: Patient Active Problem List   Diagnosis Date Noted  . Allergic reaction caused by a drug 12/05/2017  . Acute renal failure superimposed on stage 3 chronic kidney disease ([place]) 12/05/2017  . Metabolic acidosis 41/66/0630  . Hypotension 12/05/2017  . Hardware complicating wound infection ([place]) 11/30/2017  . Trimalleolar fracture of ankle, closed, left, initial encounter   . Fall 10/24/2017  . Malleolar fracture, left, closed, initial encounter 10/24/2017  . Fibula fracture 10/24/2017  . Diabetes mellitus with diabetic nephropathy without long-term current use of insulin ([place]) 01/05/2017  . CKD (chronic kidney disease), stage III ([place])   . Chronic combined systolic and diastolic CHF (congestive heart failure) ([place])   . Paroxysmal atrial fibrillation ([name])   . Bleeding gastrointestinal   . Dyslipidemia 10/14/2014  . CAD in  native artery- total LAD, s/p RI DES 06/19/14 07/11/2014  . Ulcerative colitis ([place])    Past Medical History:  Diagnosis Date  . Allergic reaction caused by a drug 11/2017  . Anemia   . Atrial flutter ([place])    a. s/p RFCA of counterclockwise cavo-tricuspid isthmus dependent atrial flutter 2009 by Dr. Rayann Heman.  Marland Kitchen CAD (coronary artery disease) 06/2014   a. 06/2014: abnl nuc. Cath: Totally occluded LAD with faint collaterals (not a candidate for CTO PCI), 90% ramus s/p DES, 50-70% OM2.  . Chronic diastolic CHF (congestive heart failure) ([place])    a. 2D Echo 10/23/13: EF 50-55%, basal mid-inf HK, grade 2 DD, mild MR, mod dilated LA, no sig change from prior.  . CKD (chronic kidney disease), stage III ([place])   . Colostomy in place [facility])   . Complication of anesthesia    " during my kidney stone surgery my heart went out of rhythm  . Compression fracture of L1 lumbar vertebra ([name])   . Diabetes ([place])    TYPE 2   . Dyslipidemia   . Dyspnea   . Frequent PVCs   . GERD (gastroesophageal reflux disease)   . History of blood transfusion   . History of kidney stones   . History of peptic ulcer disease   . HTN (hypertension)   . Hx of acute renal failure 01/02/10-3/24-11   due to hypovolemic shock, gastroenteritis and dehydration,hospitalized . Did requre a few days of  dialysis. Cr at discharge was 1.8  . Kidney disease   . Kidney stone may 2009   Right hydronephrosis, S/P stone removal  . Lower back pain   . Macular degeneration   . OA (osteoarthritis) of hip   . Obesity   . Osteopenia   . PAF (paroxysmal atrial fibrillation) ([name])    not on long term anticoagulation due to history of heme positive stools and anemia  . Shingles    episode  . Small bowel obstruction, partial ([place]) 2009   Episode  . Ulcerative colitis ([place])    a. s/p total colectomy remotely.    Family History  Problem Relation Age of Onset  . Colon cancer Mother     Past Surgical History:  Procedure Laterality Date  .  BACK SURGERY    . CARDIAC CATHETERIZATION  06/19/2014  . COLON RESECTION    . COLOSTOMY    . CORONARY ANGIOPLASTY    . CORONARY STENT PLACEMENT  06/19/2014   DES       dr Martinique  . HARDWARE REMOVAL Left 12/01/2017   Procedure: HARDWARE REMOVAL LEFT ANKLE;  Surgeon: Newt Minion, MD;  Location: [facility];  Service: Orthopedics;  Laterality: Left;  . HERNIA REPAIR    . LEFT AND RIGHT HEART CATHETERIZATION WITH CORONARY ANGIOGRAM N/A 06/19/2014   Procedure: LEFT AND RIGHT HEART CATHETERIZATION WITH CORONARY ANGIOGRAM;  Surgeon: Peter M Martinique, MD;  Location: [facility] CATH LAB;  Service: Cardiovascular;  Laterality: N/A;  . ORIF ANKLE FRACTURE Left 10/25/2017   Procedure: OPEN REDUCTION INTERNAL FIXATION (ORIF) LEFT ANKLE FRACTURE;  Surgeon: Newt Minion, MD;  Location: [facility];  Service: Orthopedics;  Laterality: Left;  . TONSILLECTOMY     Social History   Occupational History  . Not on file  Tobacco Use  . Smoking status: Never Smoker  . Smokeless tobacco: Never Used  Substance and Sexual Activity  . Alcohol use: No  . Drug use: No  . Sexual activity: Not on file

## 2017-12-07 NOTE — Progress Notes (Signed)
Patient ID: [name] [name], male   DOB: 01-Mar-1933, 84 y.o.   MRN: [id number] Patient is status post removal of deep retained hardware for skin breakdown.  Patient is examined today the wound VAC is functioning well.  Patient lays with his foot externally rotated.  There is no redness or cellulitis around the wound VAC dressing.  Continue elevation and strict nonweightbearing of the left lower extremity.

## 2017-12-08 ENCOUNTER — Inpatient Hospital Stay (HOSPITAL_COMMUNITY): Payer: Medicare Other

## 2017-12-08 ENCOUNTER — Telehealth (INDEPENDENT_AMBULATORY_CARE_PROVIDER_SITE_OTHER): Payer: Self-pay | Admitting: Radiology

## 2017-12-08 LAB — BASIC METABOLIC PANEL
Anion gap: 8 (ref 5–15)
BUN: 63 mg/dL — ABNORMAL HIGH (ref 6–20)
CO2: 18 mmol/L — ABNORMAL LOW (ref 22–32)
Calcium: 8.1 mg/dL — ABNORMAL LOW (ref 8.9–10.3)
Chloride: 111 mmol/L (ref 101–111)
Creatinine, Ser: 1.58 mg/dL — ABNORMAL HIGH (ref 0.61–1.24)
GFR calc Af Amer: 45 mL/min — ABNORMAL LOW (ref >60)
GFR calc non Af Amer: 39 mL/min — ABNORMAL LOW (ref >60)
Glucose, Bld: 180 mg/dL — ABNORMAL HIGH (ref 65–99)
Potassium: 5.2 mmol/L — ABNORMAL HIGH (ref 3.5–5.1)
Sodium: 137 mmol/L (ref 135–145)

## 2017-12-08 LAB — URINALYSIS, ROUTINE W REFLEX MICROSCOPIC
Bilirubin Urine: NEGATIVE
Glucose, UA: NEGATIVE mg/dL
Ketones, ur: NEGATIVE mg/dL
Nitrite: NEGATIVE
Protein, ur: NEGATIVE mg/dL
Specific Gravity, Urine: 1.018 (ref 1.005–1.030)
pH: 8 (ref 5.0–8.0)

## 2017-12-08 LAB — CBC
HCT: 31.8 % — ABNORMAL LOW (ref 39.0–52.0)
Hemoglobin: 10.5 g/dL — ABNORMAL LOW (ref 13.0–17.0)
MCH: 30.2 pg (ref 26.0–34.0)
MCHC: 33 g/dL (ref 30.0–36.0)
MCV: 91.4 fL (ref 78.0–100.0)
Platelets: 262 10*3/uL (ref 150–400)
RBC: 3.48 MIL/uL — ABNORMAL LOW (ref 4.22–5.81)
RDW: 14.1 % (ref 11.5–15.5)
WBC: 19.6 10*3/uL — ABNORMAL HIGH (ref 4.0–10.5)

## 2017-12-08 LAB — GLUCOSE, CAPILLARY
Glucose-Capillary: 174 mg/dL — ABNORMAL HIGH (ref 65–99)
Glucose-Capillary: 136 mg/dL — ABNORMAL HIGH (ref 65–99)
Glucose-Capillary: 151 mg/dL — ABNORMAL HIGH (ref 65–99)
Glucose-Capillary: 105 mg/dL — ABNORMAL HIGH (ref 65–99)

## 2017-12-08 MED ORDER — DIGOXIN 0.25 MG/ML IJ SOLN
0.2500 mg | Freq: Once | INTRAMUSCULAR | Status: AC
  Administered 2017-12-08: 0.25 mg via INTRAVENOUS
  Filled 2017-12-08 (×2): qty 1

## 2017-12-08 MED ORDER — PREDNISONE 10 MG PO TABS
30.0000 mg | ORAL_TABLET | Freq: Every day | ORAL | Status: DC
  Administered 2017-12-09: 30 mg via ORAL
  Filled 2017-12-08: qty 1

## 2017-12-08 MED ORDER — SODIUM CHLORIDE 0.9 % IV SOLN: INTRAVENOUS | Status: DC

## 2017-12-08 MED ORDER — SODIUM CHLORIDE 0.9 % IV BOLUS (SEPSIS)
500.0000 mL | Freq: Once | INTRAVENOUS | Status: AC
  Administered 2017-12-08: 500 mL via INTRAVENOUS

## 2017-12-08 NOTE — Progress Notes (Signed)
PROGRESS NOTE    [name] [name]  UUE:[id number] DOB: 22-Jul-1933 DOA: 12/05/2017 PCP: [name], MD  Brief Narrative:[name] [name] is a 84 y.o. male with medical history significant of UC s/p total colectomy; afib not on AC; HTN; HLD; DM; stage 3 CKD; and chronic diastolic CHF presenting with AMS.  Patient had a wound dehiscence of his left ankle with hardware removal by Dr. Sharol Given on 2/15; a Praveena wound vac was placed.  He was returned to [facility] on Doxycycline.  The medication was changed due to development of a rash and he was then started on Clindamycin.  The brother reports that the patient looked normal last night, but this AM he had diffuse edema of the face and body as well as diffuse erythema.  He was also quite altered Admitted with Anaphylaxis and AKI, he was hypotensive in ED, started on IVF, steroids, benadryl, Pepcid  Assessment & Plan:   Allergic reaction/Anaphylaxis -Admitted with diffuse drug rash, hypotension concerning for anaphylaxis secondary to doxycycline and/or clindamycin -Clinically improving, blood pressure finally better, cut down IV fluids -Stopped IV steroids, on prednisone taper now  Hypotension -Patient became profoundly hypotensive while in the ER (he was 56/34 on arrival to ED -improving, Bp stable now, off IVF  Lethargy /increased somnolence 2/22 -check UA, CXR, Blood Cx -cut down sedating meds -WBC chronically elevated likely from steroids  Acute renal failure on CKD3 with metabolic acidosis, hyperkalemia -Most likely secondary to allergic reaction and hypotension -Creatinine improving,  Back to baseline -stopped IVF, baseline creatinine around 1.4-1.5  Wound dehiscence of his left ankle -Status post hardware removal by Dr. Sharol Given on 2/15 and wound VAC placement -While previously recommended for the patient to remain on antibiotics, his foot appears to be healing well with the wound vac in place; no evidence of acute infectious process on  admission hence stopped Abx per discussion between Dr.Duda and Dr.Yates -Continue wound VAC, CAM walker is on; and the patient is non-weight bearing, seen by Dr. Sharol Given this morning, recommended to continue elevation and strict nonweightbearing   P.Afib -Rate control with Amiodarone -chadsvasc score >, not on anticoagulation at baseline due to h/o GI bleed -in NSR at this time  Chronic systolic and diastolic CHF -Clinically compensated, diuretics on hold, blood pressure still soft -EF 40-45% with grade 1 diastolic dysfunction in 9/15 -Diuretics on hold -off IVF now  DM -Actos and Onglyza on hold -continue SSI, continue low dose lantus while on steroids -A1c in 3/18 was 6.5  DVT prophylaxis:  Lovenox Code Status:  DNR - Family Communication: d/w sister Disposition Plan:  Back to SNF once clinically improved  Consultants:   Orthopedics Dr. Sharol Given   Procedures:   Antimicrobials:    Subjective: -more sleepy today, some confusion, cough  Objective: Vitals:   12/08/17 0609 12/08/17 0845 12/08/17 0852 12/08/17 1208  BP: 112/61 137/67 137/67 (!) 152/76  Pulse: 84 88 85 83  Resp: 17     Temp: 97.6 F (36.4 C) (!) 97.5 F (36.4 C)    TempSrc: Oral Oral    SpO2:      Weight:      Height:        Intake/Output Summary (Last 24 hours) at 12/08/2017 1358 Last data filed at 12/08/2017 0612 Gross per 24 hour  Intake 365 ml  Output 1375 ml  Net -1010 ml   Filed Weights   12/05/17 1001  Weight: 102.1 kg (225 lb)    Examination: Gen: more somnolent, arousable,  oriented x2, some random confusion HEENT: PERRLA, no JVD Lungs: decreased BS at bases CVS: S1S2/RRR Abd: soft, Non tender, non distended, BS present Extremities: L foot bandaged with wound Vac Skin: rash improving, now erythema limited to arms mostly  Data Reviewed:   CBC: Recent Labs  Lab 12/05/17 1318 12/05/17 1506 12/06/17 0358 12/07/17 0556 12/08/17 0414  WBC 20.0*  --  18.1* 18.8* 19.6*    NEUTROABS 16.2*  --   --   --   --   HGB 10.7* 11.2* 10.8* 9.8* 10.5*  HCT 32.2* 33.0* 33.3* 29.5* 31.8*  MCV 93.9  --  92.2 91.3 91.4  PLT 202  --  260 244 295   Basic Metabolic Panel: Recent Labs  Lab 12/05/17 1506 12/06/17 0358 12/07/17 0556 12/08/17 0414  NA 132* 132* 134* 137  K 6.0* 5.8* 5.3* 5.2*  CL 105 107 108 111  CO2  --  16* 17* 18*  GLUCOSE 184* 217* 155* 180*  BUN 73* 84* 78* 63*  CREATININE 2.60* 2.20* 1.85* 1.58*  CALCIUM  --  7.7* 7.7* 8.1*   GFR: Estimated Creatinine Clearance: 41.8 mL/min (A) (by C-G formula based on SCr of 1.58 mg/dL (H)). Liver Function Tests: No results for input(s): AST, ALT, ALKPHOS, BILITOT, PROT, ALBUMIN in the last 168 hours. No results for input(s): LIPASE, AMYLASE in the last 168 hours. No results for input(s): AMMONIA in the last 168 hours. Coagulation Profile: No results for input(s): INR, PROTIME in the last 168 hours. Cardiac Enzymes: No results for input(s): CKTOTAL, CKMB, CKMBINDEX, TROPONINI in the last 168 hours. BNP (last 3 results) No results for input(s): PROBNP in the last 8760 hours. HbA1C: No results for input(s): HGBA1C in the last 72 hours. CBG: Recent Labs  Lab 12/07/17 1157 12/07/17 1622 12/07/17 2134 12/08/17 0644 12/08/17 1119  GLUCAP 232* 218* 119* 174* 136*   Lipid Profile: No results for input(s): CHOL, HDL, LDLCALC, TRIG, CHOLHDL, LDLDIRECT in the last 72 hours. Thyroid Function Tests: No results for input(s): TSH, T4TOTAL, FREET4, T3FREE, THYROIDAB in the last 72 hours. Anemia Panel: No results for input(s): VITAMINB12, FOLATE, FERRITIN, TIBC, IRON, RETICCTPCT in the last 72 hours. Urine analysis:    Component Value Date/Time   COLORURINE STRAW (A) 01/04/2017 1229   APPEARANCEUR CLEAR 01/04/2017 1229   LABSPEC 1.012 01/04/2017 1229   PHURINE 5.0 01/04/2017 1229   GLUCOSEU NEGATIVE 01/04/2017 1229   HGBUR NEGATIVE 01/04/2017 1229   BILIRUBINUR NEGATIVE 01/04/2017 1229   KETONESUR  NEGATIVE 01/04/2017 1229   PROTEINUR NEGATIVE 01/04/2017 1229   UROBILINOGEN 0.2 10/22/2014 1052   NITRITE NEGATIVE 01/04/2017 1229   LEUKOCYTESUR NEGATIVE 01/04/2017 1229   Sepsis Labs: @LABRCNTIP (procalcitonin:4,lacticidven:4)  ) Recent Results (from the past 240 hour(s))  MRSA PCR Screening     Status: Abnormal   Collection Time: 12/05/17 10:14 PM  Result Value Ref Range Status   MRSA by PCR POSITIVE (A) NEGATIVE Final    Comment:        The GeneXpert MRSA Assay (FDA approved for NASAL specimens only), is one component of a comprehensive MRSA colonization surveillance program. It is not intended to diagnose MRSA infection nor to guide or monitor treatment for MRSA infections. RESULT CALLED TO, READ BACK BY AND VERIFIED WITHBelenda Cruise RN 12/06/17 0319 JDW Performed at [facility], [address], [place], [place] [address]          Radiology Studies: No results found.      Scheduled Meds: .  amiodarone  200 mg Oral Daily  . aspirin EC  81 mg Oral Daily  . buPROPion  75 mg Oral Daily  . docusate sodium  100 mg Oral BID  . enoxaparin (LOVENOX) injection  40 mg Subcutaneous Q24H  . ferrous sulfate  325 mg Oral BID WC  . insulin aspart  0-15 Units Subcutaneous TID WC  . insulin aspart  0-5 Units Subcutaneous QHS  . mupirocin ointment   Nasal BID  . [START ON 12/09/2017] predniSONE  30 mg Oral Q breakfast  . simvastatin  20 mg Oral QHS  . sodium chloride flush  3 mL Intravenous Q12H   Continuous Infusions:    LOS: 2 days    Time spent: 11mn    PDomenic Polite MD [facility] Page via [email or web address], password [email or web address] After 7PM please contact night-coverage  12/08/2017, 1:58 PM

## 2017-12-08 NOTE — Progress Notes (Signed)
Patient ID: [name] [name], male   DOB: [birth year], 84 y.o.   MRN: [id number] Patient is seen in consultation for altered mental status.  Patient's sister states that the patient has a significant decrease in his mental status functioning during the day today.  Patient does have a new productive cough.  Examination of the left foot the fracture boot is removed the wound VAC has a good suction fit, patient' left ankle has no redness no cellulitis no swelling no signs of infection there is no drainage from the wound VAC.  There is no tenderness to palpation around the left foot or ankle.  Altered mental status status post removal of hardware from the left ankle secondary to mechanical breakdown from patient laying on the lateral aspect of his ankle.  There was no  Abscess or cellulitis at the time of hardware removal, no clinical signs of ankle infection now.  Patient does not need antibiotics for the ankle.  I will follow-up as needed.

## 2017-12-08 NOTE — Plan of Care (Signed)
  Not Progressing Education: Knowledge of General Education information will improve 12/08/2017 0030 - Not Progressing by Peggye Pitt, RN Safety: Ability to remain free from injury will improve 12/08/2017 0030 - Not Progressing by Peggye Pitt, RN Skin Integrity: Risk for impaired skin integrity will decrease 12/08/2017 0030 - Not Progressing by Peggye Pitt, RN   Completed/Met Clinical Measurements: Respiratory complications will improve 12/08/2017 0030 - Completed/Met by Peggye Pitt, RN

## 2017-12-08 NOTE — Progress Notes (Signed)
CSW following for discharge plan. Patient is a recent readmission, with full assessment completed on 10/26/17 (see below). Per chart review, patient has arrived to hospital from [place] [facility] for rehab. Patient will need PT and OT to determine if patient is still appropriate for SNF at this time; MD is aware, and orders are in place.   CSW will await PT and OT recommendations, and will follow up with patient and family on discharge plan. CSW will continue to follow.  Laveda Abbe, [place] Clinical Social Worker [phone]    Clinical Social Work Assessment  Patient Details  Name: [name] [name] MRN: [id number] Date of Birth: 18-Jun-1933  Date of referral:  10/24/17               Reason for consult:  Facility Placement, Discharge Planning                    Permission sought to share information with:    Permission granted to share information::                Name::     Marland Kitchen             Agency::  SNF             Relationship::  sister             Contact Information:     Housing/Transportation Living arrangements for the past 2 months:  Single Family Home Source of Information:  Patient Patient Interpreter Needed:  None Criminal Activity/Legal Involvement Pertinent to Current Situation/Hospitalization:    Significant Relationships:  Siblings Lives with:  Self Do you feel safe going back to the place where you live?  Yes Need for family participation in patient care:  Yes (Comment)  Care giving concerns:  Pt with new impairment and from home alone. Pt recommended for SNF due to impairment.   CSW met with patient at bedside and discussed SNF options/placement. Pt has been to SNF in the past and would only consider a few SNF's to go to for rehab. CSW will send out to those SNF's and f/u with patient. Pt in agreement  And gave permission to discuss with sister.   Social Worker assessment / plan:  CSW will f/u for disposition.  Employment status:  Retired Radiation protection practitioner:  Medicare PT Recommendations:  Not assessed at this time Information / Referral to community resources:     Patient/Family's Response to care:  Pt/Family appreciative of CSW f/u for SNF placement and was very specific about the SNF's he is willing to go to for care. C  Patient/Family's Understanding of and Emotional Response to Diagnosis, Current Treatment, and Prognosis:  Pt has limited support at home and is agreement with recommendations for SNF due to his impairment. Family involved in care and are also in agreement with recommendations. Pt has experience with SNF and acknowledges the benefit of getting short term rehab. Pt plan is return home once completed short term rehab. No other issues or concerns identified.  Emotional Assessment Appearance:  Appears stated age Attitude/Demeanor/Rapport:  (Cooperative) Affect (typically observed):  Accepting, Pleasant, Calm Orientation:  Oriented to Self, Oriented to Place, Oriented to  Time, Oriented to Situation Alcohol / Substance use:    Psych involvement (Current and /or in the community):  No (Comment)  Discharge Needs  Concerns to be addressed:  No discharge needs identified Readmission within the last 30 days:  No Current discharge risk:  None Barriers to  Discharge:  No Barriers Identified   Normajean Baxter, LCSW 10/26/2017, 4:47 PM

## 2017-12-08 NOTE — Evaluation (Signed)
Occupational Therapy Evaluation Patient Details Name: [name] [name] MRN: [id number] DOB: [birth year] Today's Date: 12/08/2017    History of Present Illness [name] Dewolfe Blakeis a 84 y.o.malewith medical history significant ofUC s/p total colectomy; afib not on AC; HTN; HLD; DM; stage 3 CKD; and chronic diastolic CHF presenting withAMS. Patient had a wound dehiscence of his left ankle with hardware removal by Dr. Sharol Given on 2/15; a Praveena wound vac was placed. He was returned to [facility] on Doxycycline. The medication was changed due to development of a rash and he was then started on Clindamycin. The brother reports that the patient looked normal last night, but this AM he had diffuse edema of the face and body as well as diffuse erythema. He was also quite altered Admitted with Anaphylaxis and AKI, he was hypotensive in ED, started on IVF, steroids, benadryl, Pepcid   Clinical Impression   PTA Pt was [facility] working with therapy on transfers at [facility] level and getting assist from staff for ADL. Pt is currently so cognitively impaired that he was unable to follow commands, total A +2 for bed mobility and unsafe to attempt transfers at this time. Pt's sister expressed concerns for aspiration (MD in room and aware). OT will continue to follow for improved mental status to progress with therapy and maximize safety and independence in ADL and functional transfers. Return to SNF when medically stable to continue therapy.     Follow Up Recommendations  SNF;Supervision/Assistance - 24 hour    Equipment Recommendations  Other (comment)(defer to next venue)    Recommendations for Other Services       Precautions / Restrictions Precautions Precautions: Fall Restrictions Weight Bearing Restrictions: Yes LLE Weight Bearing: Non weight bearing      Mobility Bed Mobility Overal bed mobility: Needs Assistance Bed Mobility: Supine to Sit;Sit to Supine     Supine to sit: Total assist;+2 for  physical assistance;+2 for safety/equipment Sit to supine: Total assist;+2 for physical assistance;+2 for safety/equipment   General bed mobility comments: total A for all aspects of bed mobility. Pt pushing back against therapists, unable to follow commands  Transfers                 General transfer comment: not safe to attempt at this time    Balance Overall balance assessment: Needs assistance Sitting-balance support: Bilateral upper extremity supported;Feet supported Sitting balance-[name]: Zero Sitting balance - Comments: requires total A for sitting EOB Postural control: Posterior lean;Left lateral lean                                 ADL either performed or assessed with clinical judgement   ADL Overall ADL's : Needs assistance/impaired                                       General ADL Comments: At this time, due to cognition, pt Total A for ADL     Vision         Perception     Praxis      Pertinent Vitals/Pain Pain Assessment: Faces Pain Score: 0-No pain Pain Intervention(s): Monitored during session     Hand Dominance Right   Extremity/Trunk Assessment Upper Extremity Assessment Upper Extremity Assessment: Overall WFL for tasks assessed(redness and edema noted)   Lower Extremity Assessment Lower Extremity Assessment: Defer  to PT evaluation       Communication Communication Communication: No difficulties   Cognition Arousal/Alertness: Suspect due to medications Behavior During Therapy: Restless Overall Cognitive Status: Impaired/Different from baseline Area of Impairment: Orientation;Attention;Following commands;Awareness;Problem solving                 Orientation Level: Disoriented to;Situation;Time;Place Current Attention Level: Focused   Following Commands: Follows one step commands inconsistently   Awareness: Intellectual Problem Solving: Slow processing;Decreased initiation;Difficulty  sequencing;Requires verbal cues;Requires tactile cues General Comments: 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 Concerned about aspiration - MD in room and observed sipping from straw. Sister shared that they lost a brother to aspiration previously.     Exercises     Shoulder Instructions      Home Living Family/patient expects to be discharged to:: Skilled nursing facility Living Arrangements: Alone                                      Prior Functioning/Environment Level of Independence: Needs assistance  Gait / Transfers Assistance Needed: working with therapy at SNF - [facility] level transfers ADL's / Homemaking Assistance Needed: required assist from SNF staff for ADL   Comments: has been at SNF since ORIF        OT Problem List: Decreased activity tolerance;Impaired balance (sitting and/or standing);Decreased cognition;Decreased safety awareness;Decreased knowledge of use of DME or AE;Decreased knowledge of precautions;Increased edema      OT Treatment/Interventions: Therapeutic exercise;DME and/or AE instruction;Therapeutic activities;Patient/family education;Balance training;Self-care/ADL training    OT Goals(Current goals can be found in the care plan section) Acute Rehab OT Goals Patient Stated Goal: for her brother to get back to rehab OT Goal Formulation: With family Time For Goal Achievement: 12/22/17 Potential to Achieve Goals: Fair ADL Goals Pt Will Perform Grooming: with min assist;sitting Pt Will Transfer to Toilet: squat pivot transfer;bedside commode;with max assist Pt Will Perform Toileting - Clothing Manipulation and hygiene: with 2+ total assist;sit to/from stand Additional ADL Goal #1: Pt will perform bed mobility with max A prior to engaging in ADL activity  OT Frequency: Min  1X/week   Barriers to D/C: Decreased caregiver support  Pt lives alone       Co-evaluation PT/OT/SLP Co-Evaluation/Treatment: Yes Reason for Co-Treatment: Complexity of the patient's impairments (multi-system involvement);Necessary to address cognition/behavior during functional activity;For patient/therapist safety   OT goals addressed during session: ADL's and self-care      AM-PAC PT "6 Clicks" Daily Activity     Outcome Measure Help from another person eating meals?: Total Help from another person taking care of personal grooming?: Total Help from another person toileting, which includes using toliet, bedpan, or urinal?: Total Help from another person bathing (including washing, rinsing, drying)?: Total Help from another person to put on and taking off regular upper body clothing?: Total Help from another person to put on and taking off regular lower body clothing?: Total 6 Click Score: 6   End of Session Nurse Communication: Mobility status  Activity Tolerance: Treatment limited secondary to agitation Patient left: in bed;with call bell/phone within reach;with family/visitor present  OT Visit Diagnosis: History of falling (Z91.81);Other symptoms and signs involving cognitive function;Adult, failure to thrive (R62.7)  Time: 6999-6722 OT Time Calculation (min): 20 min Charges:  OT General Charges $OT Visit: 1 Visit OT Evaluation $OT Eval Moderate Complexity: 1 Mod G-Codes:     Hulda Humphrey OTR/L [place] 12/08/2017, 4:30 PM

## 2017-12-08 NOTE — Progress Notes (Signed)
Physical Therapy Treatment Patient Details Name: [name] [name] MRN: [id number] DOB: Nov 02, 1932 Today's Date: 12/08/2017    History of Present Illness [name] Borgmeyer Blakeis a 84 y.o.malewith medical history significant ofUC s/p total colectomy; afib not on AC; HTN; HLD; DM; stage 3 CKD; and chronic diastolic CHF presenting withAMS. Patient had a wound dehiscence of his left ankle with hardware removal by Dr. Sharol Given on 2/15; a Praveena wound vac was placed. He was returned to [facility] on Doxycycline. The medication was changed due to development of a rash and he was then started on Clindamycin. The brother reports that the patient looked normal last night, but this AM he had diffuse edema of the face and body as well as diffuse erythema. He was also quite altered Admitted with Anaphylaxis and AKI, he was hypotensive in ED, started on IVF, steroids, benadryl, Pepcid    PT Comments    Pt presents to PT with significant impairments with mobility and cognition. Recommend return to SNF for further rehab when medically ready. If cognition improves expect rate of progress will improve.   Follow Up Recommendations  SNF     Equipment Recommendations  None recommended by PT    Recommendations for Other Services       Precautions / Restrictions Precautions Precautions: Fall Restrictions Weight Bearing Restrictions: Yes LLE Weight Bearing: Non weight bearing    Mobility  Bed Mobility Overal bed mobility: Needs Assistance Bed Mobility: Supine to Sit;Sit to Supine     Supine to sit: Total assist;+2 for physical assistance;+2 for safety/equipment Sit to supine: Total assist;+2 for physical assistance;+2 for safety/equipment   General bed mobility comments: total A for all aspects of bed mobility. Pt pushing back against therapists, unable to follow commands  Transfers                 General transfer comment: not safe to attempt at this time  Ambulation/Gait                  Stairs            Wheelchair Mobility    Modified Rankin (Stroke Patients Only)       Balance Overall balance assessment: Needs assistance Sitting-balance support: Bilateral upper extremity supported;Feet supported Sitting balance-[name]: Zero Sitting balance - Comments: Sat EOB x 5 minutes with total assist. Pt leaning heavily posteriorly on therapist causing hips to slide forward to EOB. Pt returned to supine due to risk of hips sliding off of bed. Postural control: Posterior lean;Left lateral lean                                  Cognition Arousal/Alertness: Suspect due to medications Behavior During Therapy: Restless Overall Cognitive Status: Impaired/Different from baseline Area of Impairment: Orientation;Attention;Following commands;Awareness;Problem solving                 Orientation Level: Disoriented to;Situation;Time;Place Current Attention Level: Focused   Following Commands: Follows one step commands inconsistently   Awareness: Intellectual Problem Solving: Slow processing;Decreased initiation;Difficulty sequencing;Requires verbal cues;Requires tactile cues General Comments: 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" and making tangential comments throughout session, not able to follow directions at all      Exercises      General Comments General comments (skin integrity, edema, etc.): Pt sister present      Pertinent Vitals/Pain Pain Assessment: Faces Pain Score: 0-No pain Faces Pain  Scale: No hurt Pain Intervention(s): Monitored during session    Home Living Family/patient expects to be discharged to:: Skilled nursing facility Living Arrangements: Alone                  Prior Function Level of Independence: Needs assistance  Gait / Transfers Assistance Needed: working with therapy at SNF - wc level transfers ADL's / Homemaking Assistance Needed: required assist from SNF staff for  ADL Comments: has been at SNF since ORIF   PT Goals (current goals can now be found in the care plan section) Acute Rehab PT Goals Patient Stated Goal: for her brother to get back to rehab PT Goal Formulation: With family Time For Goal Achievement: 12/22/17 Potential to Achieve Goals: Fair    Frequency    Min 2X/week      PT Plan      Co-evaluation PT/OT/SLP Co-Evaluation/Treatment: Yes Reason for Co-Treatment: Complexity of the patient's impairments (multi-system involvement);Necessary to address cognition/behavior during functional activity;For patient/therapist safety PT goals addressed during session: Mobility/safety with mobility;Balance OT goals addressed during session: ADL's and self-care      AM-PAC PT "6 Clicks" Daily Activity  Outcome Measure  Difficulty turning over in bed (including adjusting bedclothes, sheets and blankets)?: Unable Difficulty moving from lying on back to sitting on the side of the bed? : Unable Difficulty sitting down on and standing up from a chair with arms (e.g., wheelchair, bedside commode, etc,.)?: Unable Help needed moving to and from a bed to chair (including a wheelchair)?: Total Help needed walking in hospital room?: Total Help needed climbing 3-5 steps with a railing? : Total 6 Click Score: 6    End of Session   Activity Tolerance: Patient limited by fatigue;Other (comment)(poor cognition) Patient left: in bed;with call bell/phone within reach;with bed alarm set;with family/visitor present Nurse Communication: Mobility status PT Visit Diagnosis: Other abnormalities of gait and mobility (R26.89);Repeated falls (R29.6);History of falling (Z91.81);Muscle weakness (generalized) (M62.81)     Time: 8721-5872 PT Time Calculation (min) (ACUTE ONLY): 16 min  Charges:                       G Codes:       [facility] PT [place] 12/08/2017, 5:06 PM

## 2017-12-08 NOTE — Telephone Encounter (Signed)
Patient's sister [name] [name] called. She is extremely concerned because [name] [name] is hallucinating and talking to people that are not there. She states that he has been very lucid up until this point and seems to be getting worse as time goes on. She requests Dr. Sharol Given go by and see the patient and assess whether he needs an antibiotic or not. She expresses her concern that her father passed from sepsis and this seems the same. She states that she had spoken with the hospitalist who told her they would not prescribe an antibiotic, and that Dr. Sharol Given would have to do that since he did surgery on the patient.  They are going to start weaning the patient's prednisone as they think this may help his state.   I spoke with Dr. Sharol Given who is going to go by and see the patient. Patient's sister advised.

## 2017-12-09 DIAGNOSIS — L271 Localized skin eruption due to drugs and medicaments taken internally: Secondary | ICD-10-CM

## 2017-12-09 DIAGNOSIS — I480 Paroxysmal atrial fibrillation: Secondary | ICD-10-CM

## 2017-12-09 DIAGNOSIS — Z888 Allergy status to other drugs, medicaments and biological substances status: Secondary | ICD-10-CM

## 2017-12-09 DIAGNOSIS — Z882 Allergy status to sulfonamides status: Secondary | ICD-10-CM

## 2017-12-09 DIAGNOSIS — X58XXXD Exposure to other specified factors, subsequent encounter: Secondary | ICD-10-CM

## 2017-12-09 DIAGNOSIS — N179 Acute kidney failure, unspecified: Secondary | ICD-10-CM

## 2017-12-09 DIAGNOSIS — N183 Chronic kidney disease, stage 3 (moderate): Secondary | ICD-10-CM

## 2017-12-09 DIAGNOSIS — G9341 Metabolic encephalopathy: Secondary | ICD-10-CM | POA: Diagnosis not present

## 2017-12-09 DIAGNOSIS — E1122 Type 2 diabetes mellitus with diabetic chronic kidney disease: Secondary | ICD-10-CM

## 2017-12-09 DIAGNOSIS — S82842D Displaced bimalleolar fracture of left lower leg, subsequent encounter for closed fracture with routine healing: Secondary | ICD-10-CM

## 2017-12-09 DIAGNOSIS — Z881 Allergy status to other antibiotic agents status: Secondary | ICD-10-CM

## 2017-12-09 DIAGNOSIS — Z933 Colostomy status: Secondary | ICD-10-CM

## 2017-12-09 DIAGNOSIS — K5190 Ulcerative colitis, unspecified, without complications: Secondary | ICD-10-CM

## 2017-12-09 DIAGNOSIS — Z880 Allergy status to penicillin: Secondary | ICD-10-CM

## 2017-12-09 DIAGNOSIS — T364X5A Adverse effect of tetracyclines, initial encounter: Secondary | ICD-10-CM

## 2017-12-09 DIAGNOSIS — R05 Cough: Secondary | ICD-10-CM

## 2017-12-09 DIAGNOSIS — D72829 Elevated white blood cell count, unspecified: Secondary | ICD-10-CM

## 2017-12-09 DIAGNOSIS — T782XXA Anaphylactic shock, unspecified, initial encounter: Secondary | ICD-10-CM

## 2017-12-09 DIAGNOSIS — Z978 Presence of other specified devices: Secondary | ICD-10-CM

## 2017-12-09 LAB — BASIC METABOLIC PANEL
Anion gap: 7 (ref 5–15)
BUN: 59 mg/dL — ABNORMAL HIGH (ref 6–20)
CO2: 19 mmol/L — ABNORMAL LOW (ref 22–32)
Calcium: 8.1 mg/dL — ABNORMAL LOW (ref 8.9–10.3)
Chloride: 112 mmol/L — ABNORMAL HIGH (ref 101–111)
Creatinine, Ser: 1.62 mg/dL — ABNORMAL HIGH (ref 0.61–1.24)
GFR calc Af Amer: 44 mL/min — ABNORMAL LOW (ref >60)
GFR calc non Af Amer: 38 mL/min — ABNORMAL LOW (ref >60)
Glucose, Bld: 100 mg/dL — ABNORMAL HIGH (ref 65–99)
Potassium: 4.7 mmol/L (ref 3.5–5.1)
Sodium: 138 mmol/L (ref 135–145)

## 2017-12-09 LAB — CBC
HCT: 32.6 % — ABNORMAL LOW (ref 39.0–52.0)
Hemoglobin: 10.7 g/dL — ABNORMAL LOW (ref 13.0–17.0)
MCH: 30.1 pg (ref 26.0–34.0)
MCHC: 32.8 g/dL (ref 30.0–36.0)
MCV: 91.6 fL (ref 78.0–100.0)
Platelets: 294 10*3/uL (ref 150–400)
RBC: 3.56 MIL/uL — ABNORMAL LOW (ref 4.22–5.81)
RDW: 14.2 % (ref 11.5–15.5)
WBC: 26.4 10*3/uL — ABNORMAL HIGH (ref 4.0–10.5)

## 2017-12-09 LAB — DIFFERENTIAL
Basophils Absolute: 0.1 10*3/uL (ref 0.0–0.1)
Basophils Relative: 1 %
Eosinophils Absolute: 0.5 10*3/uL (ref 0.0–0.7)
Eosinophils Relative: 2 %
Lymphocytes Relative: 43 %
Lymphs Abs: 11 10*3/uL — ABNORMAL HIGH (ref 0.7–4.0)
Monocytes Absolute: 1.3 10*3/uL — ABNORMAL HIGH (ref 0.1–1.0)
Monocytes Relative: 5 %
Neutro Abs: 12.8 10*3/uL — ABNORMAL HIGH (ref 1.7–7.7)
Neutrophils Relative %: 49 %

## 2017-12-09 LAB — URINE CULTURE

## 2017-12-09 LAB — GLUCOSE, CAPILLARY
Glucose-Capillary: 85 mg/dL (ref 65–99)
Glucose-Capillary: 131 mg/dL — ABNORMAL HIGH (ref 65–99)
Glucose-Capillary: 143 mg/dL — ABNORMAL HIGH (ref 65–99)
Glucose-Capillary: 113 mg/dL — ABNORMAL HIGH (ref 65–99)

## 2017-12-09 MED ORDER — FUROSEMIDE 10 MG/ML IJ SOLN
40.0000 mg | Freq: Four times a day (QID) | INTRAMUSCULAR | Status: DC
  Administered 2017-12-09: 40 mg via INTRAVENOUS
  Filled 2017-12-09: qty 4

## 2017-12-09 MED ORDER — AMIODARONE IV BOLUS ONLY 150 MG/100ML
150.0000 mg | Freq: Once | INTRAVENOUS | Status: AC
  Administered 2017-12-09: 150 mg via INTRAVENOUS
  Filled 2017-12-09: qty 100

## 2017-12-09 MED ORDER — PREDNISONE 20 MG PO TABS: 20.0000 mg | ORAL_TABLET | Freq: Every day | ORAL | Status: DC

## 2017-12-09 MED ORDER — AMIODARONE HCL 200 MG PO TABS: 200.0000 mg | ORAL_TABLET | Freq: Once | ORAL | Status: DC

## 2017-12-09 NOTE — Progress Notes (Signed)
Patient was able to remove colostomy bag with mitts on. Patient was cleaned and new colostomy bag applied. Mitts remain on. Safety sitter/tele sitter ordered. Will continue to monitor

## 2017-12-09 NOTE — Consult Note (Signed)
[name] for Infectious Disease         Reason for Consult:    Referring Physician: joseph  Principal Problem:   Allergic reaction caused by a drug Active Problems:   Chronic combined systolic and diastolic CHF (congestive heart failure) ([name])   Paroxysmal atrial fibrillation ([place])   Diabetes mellitus with diabetic nephropathy without long-term current use of insulin ([place])   Hardware complicating wound infection ([place])   Acute renal failure superimposed on stage 3 chronic kidney disease ([name])   Metabolic acidosis   Hypotension   Acute metabolic encephalopathy    HPI: [name] [name] is a 84 y.o. male with hx of UC s/p colostomy,DM, afib/flutter who recently had left bimalleolar ankle fracture and is roughly 6 wk out for ORIF c/b wound dehiscence and HW exposure. He underwent removal of HW and wound debridement with wound vac placement on 2/15 and discharged to SNF on doxycycline. He immediately developed diffuse macularpapular rash. He stopped doxy on 2/16 and switched to clindamycin. He was admitted on 2/19 for worsening symptoms, hypotension, aki with cr - 2.6 and hyponatremia with na of 132. concern for delayed anaphylaxis, possibly. He was fluid rescucitated and given IV steroids, he remains afebrile, but has had leukocytosis up to 26K. No new source of infection. Left ankle wound appears to have good  Clean wound bed per report. Due to leukocytosis underwent cxr that should fluid overload. Patient remains encephalopathic thought to be secondary to high dose steroids. His erythroderma appears improved. Slight improvement in mentation per his sister who is at bedside. Has mild cough today.  Past Medical History:  Diagnosis Date  . Allergic reaction caused by a drug 11/2017  . Anemia   . Atrial flutter ([place])    a. s/p RFCA of counterclockwise cavo-tricuspid isthmus dependent atrial flutter 2009 by Dr. Rayann Heman.  Marland Kitchen CAD (coronary artery disease) 06/2014   a. 06/2014: abnl nuc. Cath:  Totally occluded LAD with faint collaterals (not a candidate for CTO PCI), 90% ramus s/p DES, 50-70% OM2.  . Chronic diastolic CHF (congestive heart failure) ([place])    a. 2D Echo 10/23/13: EF 50-55%, basal mid-inf HK, grade 2 DD, mild MR, mod dilated LA, no sig change from prior.  . CKD (chronic kidney disease), stage III ([place])   . Colostomy in place [facility])   . Complication of anesthesia    " during my kidney stone surgery my heart went out of rhythm  . Compression fracture of L1 lumbar vertebra ([name])   . Diabetes ([place])    TYPE 2   . Dyslipidemia   . Dyspnea   . Frequent PVCs   . GERD (gastroesophageal reflux disease)   . History of blood transfusion   . History of kidney stones   . History of peptic ulcer disease   . HTN (hypertension)   . Hx of acute renal failure 01/02/10-3/24-11   due to hypovolemic shock, gastroenteritis and dehydration,hospitalized . Did requre a few days of dialysis. Cr at discharge was 1.8  . Kidney disease   . Kidney stone may 2009   Right hydronephrosis, S/P stone removal  . Lower back pain   . Macular degeneration   . OA (osteoarthritis) of hip   . Obesity   . Osteopenia   . PAF (paroxysmal atrial fibrillation) ([name])    not on long term anticoagulation due to history of heme positive stools and anemia  . Shingles    episode  . Small bowel obstruction, partial (  [place]) 2009   Episode  . Ulcerative colitis ([place])    a. s/p total colectomy remotely.    Allergies:  Allergies  Allergen Reactions  . Brilinta [Ticagrelor] Shortness Of Breath and Other (See Comments)    Changed to Plavix due to SOB  . Doxycycline Anaphylaxis  . Penicillins Rash and Other (See Comments)    Has patient had a PCN reaction causing immediate rash, facial/tongue/throat swelling, SOB or lightheadedness with hypotension:Yes Has patient had a PCN reaction causing severe rash involving mucus membranes or skin necrosis: No Has patient had a PCN reaction that required hospitalization:  No Has patient had a PCN reaction occurring within the last 10 years: No If all of the above answers are "NO", then may proceed with Cephalosporin use.  . Sulfa Antibiotics Other (See Comments)    Pt does not remember the reaction, but it sure the allergy exists.     MEDICATIONS: . amiodarone  200 mg Oral Daily  . aspirin EC  81 mg Oral Daily  . buPROPion  75 mg Oral Daily  . docusate sodium  100 mg Oral BID  . enoxaparin (LOVENOX) injection  40 mg Subcutaneous Q24H  . ferrous sulfate  325 mg Oral BID WC  . insulin aspart  0-15 Units Subcutaneous TID WC  . insulin aspart  0-5 Units Subcutaneous QHS  . mupirocin ointment   Nasal BID  . [START ON 12/10/2017] predniSONE  20 mg Oral Q breakfast  . simvastatin  20 mg Oral QHS  . sodium chloride flush  3 mL Intravenous Q12H    Social History   Tobacco Use  . Smoking status: Never Smoker  . Smokeless tobacco: Never Used  Substance Use Topics  . Alcohol use: No  . Drug use: No    Family History  Problem Relation Age of Onset  . Colon cancer Mother      Review of Systems  Unable to obtain reliably due to delirium  OBJECTIVE: Temp:  [98.1 F (36.7 C)-98.5 F (36.9 C)] 98.3 F (36.8 C) (02/23 0636) Pulse Rate:  [82-136] 85 (02/23 1321) Resp:  [20-33] 22 (02/23 1321) BP: (94-138)/(56-101) 107/91 (02/23 1321) Physical Exam  Constitutional: He is oriented to person,only, intermittently follows command. He appears well-developed and well-nourished. No distress.  HENT:  Mouth/Throat: Oropharynx is clear and moist. No oropharyngeal exudate.  Neck : supple no nuchal rigidity Cardiovascular: Normal rate, regular rhythm and normal heart sounds. Exam reveals no gallop and no friction rub.  No murmur heard.  Pulmonary/Chest: Effort normal and breath sounds normal. No respiratory distress. He has no wheezes.  Abdominal: Soft. Bowel sounds are normal. He exhibits no distension. There is no tenderness.  Lymphadenopathy:  He has no  cervical adenopathy.  Neurological: He is alert and oriented to person. CN 2-12 grossly intact Skin: Skin is erythamatous, erythroderma? Like in upper extremities Ext: anasarca to upper extremities. 1+ edema to legs. Left foot with wound vac Psychiatric: He has a normal mood and affect. His behavior is normal.    LABS: Results for orders placed or performed during the hospital encounter of 12/05/17 (from the past 48 hour(s))  Glucose, capillary     Status: Abnormal   Collection Time: 12/07/17  4:22 PM  Result Value Ref Range   Glucose-Capillary 218 (H) 65 - 99 mg/dL  Glucose, capillary     Status: Abnormal   Collection Time: 12/07/17  9:34 PM  Result Value Ref Range   Glucose-Capillary 119 (H) 65 - 99 mg/dL  CBC     Status: Abnormal   Collection Time: 12/08/17  4:14 AM  Result Value Ref Range   WBC 19.6 (H) 4.0 - 10.5 K/uL   RBC 3.48 (L) 4.22 - 5.81 MIL/uL   Hemoglobin 10.5 (L) 13.0 - 17.0 g/dL   HCT 31.8 (L) 39.0 - 52.0 %   MCV 91.4 78.0 - 100.0 fL   MCH 30.2 26.0 - 34.0 pg   MCHC 33.0 30.0 - 36.0 g/dL   RDW 14.1 11.5 - 15.5 %   Platelets 262 150 - 400 K/uL    Comment: Performed at [facility] [address], [place], [place] [address]  Basic metabolic panel     Status: Abnormal   Collection Time: 12/08/17  4:14 AM  Result Value Ref Range   Sodium 137 135 - 145 mmol/L   Potassium 5.2 (H) 3.5 - 5.1 mmol/L   Chloride 111 101 - 111 mmol/L   CO2 18 (L) 22 - 32 mmol/L   Glucose, Bld 180 (H) 65 - 99 mg/dL   BUN 63 (H) 6 - 20 mg/dL   Creatinine, Ser 1.58 (H) 0.61 - 1.24 mg/dL   Calcium 8.1 (L) 8.9 - 10.3 mg/dL   GFR calc non Af Amer 39 (L) >60 mL/min   GFR calc Af Amer 45 (L) >60 mL/min    Comment: (NOTE) The eGFR has been calculated using the CKD EPI equation. This calculation has not been validated in all clinical situations. eGFR's persistently <60 mL/min signify possible Chronic Kidney Disease.    Anion gap 8 5 - 15    Comment: Performed at [facility] [address] [place], [place] [address]  Glucose, capillary     Status: Abnormal   Collection Time: 12/08/17  6:44 AM  Result Value Ref Range   Glucose-Capillary 174 (H) 65 - 99 mg/dL  Glucose, capillary     Status: Abnormal   Collection Time: 12/08/17 11:19 AM  Result Value Ref Range   Glucose-Capillary 136 (H) 65 - 99 mg/dL  Urinalysis, Routine w reflex microscopic     Status: Abnormal   Collection Time: 12/08/17  4:00 PM  Result Value Ref Range   Color, Urine YELLOW YELLOW   APPearance HAZY (A) CLEAR   Specific Gravity, Urine 1.018 1.005 - 1.030   pH 8.0 5.0 - 8.0   Glucose, UA NEGATIVE NEGATIVE mg/dL   Hgb urine dipstick SMALL (A) NEGATIVE   Bilirubin Urine NEGATIVE NEGATIVE   Ketones, ur NEGATIVE NEGATIVE mg/dL   Protein, ur NEGATIVE NEGATIVE mg/dL   Nitrite NEGATIVE NEGATIVE   Leukocytes, UA MODERATE (A) NEGATIVE   RBC / HPF 6-30 0 - 5 RBC/hpf   WBC, UA 0-5 0 - 5 WBC/hpf   Bacteria, UA RARE (A) NONE SEEN   Squamous Epithelial / LPF 0-5 (A) NONE SEEN   Mucus PRESENT    Hyaline Casts, UA PRESENT    Granular Casts, UA PRESENT    Triple Phosphate Crystal PRESENT     Comment: Performed at [facility], [place] [address], [place], [place] [address]  Glucose, capillary     Status: Abnormal   Collection Time: 12/08/17  4:55 PM  Result Value Ref Range   Glucose-Capillary 151 (H) 65 - 99 mg/dL  Glucose, capillary     Status: Abnormal   Collection Time: 12/08/17  9:59 PM  Result Value Ref Range   Glucose-Capillary 105 (H) 65 - 99 mg/dL  CBC     Status:  Abnormal   Collection Time: 12/09/17  2:38 AM  Result Value Ref Range   WBC 26.4 (H) 4.0 - 10.5 K/uL   RBC 3.56 (L) 4.22 - 5.81 MIL/uL   Hemoglobin 10.7 (L) 13.0 - 17.0 g/dL   HCT 32.6 (L) 39.0 - 52.0 %   MCV 91.6 78.0 - 100.0 fL   MCH 30.1 26.0 - 34.0 pg   MCHC 32.8 30.0 - 36.0 g/dL   RDW 14.2 11.5 - 15.5 %   Platelets 294 150 - 400 K/uL    Comment: Performed at [facility], [place] [address], [place],  [place] [address]  Basic metabolic panel     Status: Abnormal   Collection Time: 12/09/17  2:38 AM  Result Value Ref Range   Sodium 138 135 - 145 mmol/L   Potassium 4.7 3.5 - 5.1 mmol/L   Chloride 112 (H) 101 - 111 mmol/L   CO2 19 (L) 22 - 32 mmol/L   Glucose, Bld 100 (H) 65 - 99 mg/dL   BUN 59 (H) 6 - 20 mg/dL   Creatinine, Ser 1.62 (H) 0.61 - 1.24 mg/dL   Calcium 8.1 (L) 8.9 - 10.3 mg/dL   GFR calc non Af Amer 38 (L) >60 mL/min   GFR calc Af Amer 44 (L) >60 mL/min    Comment: (NOTE) The eGFR has been calculated using the CKD EPI equation. This calculation has not been validated in all clinical situations. eGFR's persistently <60 mL/min signify possible Chronic Kidney Disease.    Anion gap 7 5 - 15    Comment: Performed at [facility] [address], [place], [place] [address]  Glucose, capillary     Status: None   Collection Time: 12/09/17  6:32 AM  Result Value Ref Range   Glucose-Capillary 85 65 - 99 mg/dL  Glucose, capillary     Status: Abnormal   Collection Time: 12/09/17 11:29 AM  Result Value Ref Range   Glucose-Capillary 131 (H) 65 - 99 mg/dL    MICRO: 2/22 blood cx ngtd IMAGING: Dg Chest 2 View  Result Date: 12/08/2017 CLINICAL DATA:  Altered mental status.  Productive cough. EXAM: CHEST  2 VIEW COMPARISON:  01/04/2017 FINDINGS: The patient is hunched over to the left in a semi erect projection. This results in asymmetric distribution of soft tissues which is believed to at least partially explain why the left hemithorax appears more dense than the right. Upper zone pulmonary vascular prominence with mild enlargement of the cardiopericardial silhouette suggesting pulmonary venous hypertension. Kerley B lines are present suggesting mild interstitial edema. There is blunting of both posterior costophrenic angles suggesting small bilateral pleural effusions. Lower thoracic kyphosis noted, stable. IMPRESSION: 1. Kerley B-lines and pulmonary venous hypertension with mild  cardiomegaly. The appearance is compatible with mild interstitial edema. 2. The most of the hemithoracic asymmetry is due to the distribution of soft tissues of the chest wall given that the patient is hunched over to the left. 3.  Aortic Atherosclerosis (ICD10-I70.0). 4. Small bilateral pleural effusions. Electronically Signed   By: Van Clines M.D.   On: 12/08/2017 16:56    Assessment/Plan:  84yo M admitted for allergic reaction predominantly with marked maculapapular rash, with concern for anaphylaxis given hypotension who has been fluid rescucitated plus given high dose steroids up to 186m prednisone on 2/21 who has leukocytosis up to 26K from 19 today. Concern for secondary infection  - no new occult infection stands out to me.  - I suspect his  delirium maybe multifactorial with steroids, poor sleep,  - continue with steroid taper -  If he starts having diarrhea would empirically treat for cdifficile since he hash abtx exposure  - if he starts having fevers or hypotension, would empirically start vancomycin and cefepime - I do not suspect pneumonia or UTI at this time since UA is clear and denies dysuria  - leukocytosis still likely due to steroids. - cough possibly due to pulmonary vascular congestion, agree with gentle diuresis  Will follow up tomorrow.  Elzie Rings [facility] for Infectious Diseases [phone]

## 2017-12-09 NOTE — Progress Notes (Signed)
Patient's confusion increasing. Patient staring at the ceiling. When asked what he was looking at, he said "you". Patient is disoriented to place and time. Patient can tell his name and birth date but nothing else. [place] about his deceased wife, and that he needed to get his pants on. Patient is very restless and continues to talk to himself. Will continue to monitor.

## 2017-12-09 NOTE — Progress Notes (Signed)
Patient's heart rate 110's after amio bolus. Will continue to monitor

## 2017-12-09 NOTE — Significant Event (Signed)
Rapid Response Event Note  Overview: Called d/t patient HR-140s Time Called: 2220 Arrival Time: 2225 Event Type: Cardiac  Initial Focused Assessment: Pt laying in bed restless, alert X 1, skin warm and dry, T-98.5, BP-104/81, HR-138, R-22, SpO2-965 on RA.  Interventions: EKG-A fib, 500cc NS bolus and 0.18m digoxin given per NP Blount order.  Plan of Care (if not transferred): Continue to monitor pt.  Notify NP if HR still high after bolus/digoxin given.  Call RRT if further assistance needed.  Update-0000-HR-150s after bolus and digoxin, 159mamiodarone bolus given IV. 0200-HR 80s   Event Summary: Name of Physician Notified: Blount, NP at 2224(PTA RRT)    at    Outcome: Stayed in room and stabalized  Event End Time: 2330  [facility] [place]

## 2017-12-09 NOTE — Plan of Care (Signed)
  Progressing Clinical Measurements: Diagnostic test results will improve 12/09/2017 0437 - Progressing by Peggye Pitt, RN   Not Progressing Education: Knowledge of General Education information will improve 12/09/2017 [phone] - Not Progressing by Peggye Pitt, RN Health Behavior/Discharge Planning: Ability to manage health-related needs will improve 12/09/2017 0437 - Not Progressing by Peggye Pitt, RN Activity: Risk for activity intolerance will decrease 12/09/2017 0437 - Not Progressing by Peggye Pitt, RN Safety: Ability to remain free from injury will improve 12/09/2017 0437 - Not Progressing by Peggye Pitt, RN

## 2017-12-09 NOTE — Progress Notes (Signed)
Patient's bilateral arms where the blood pressure cuff was placed leaves red streaks and turns area below the cuff a purple color after using the cuff one time. Attempted the right lower leg to get b/p and the foot turned purple while the cuff was pumping up. Patient is still having considerable amount of weeping. Will continue to monitor

## 2017-12-09 NOTE — Progress Notes (Signed)
Patient's wound vac stopped. It was beeping and there was no cord to charge. Wound vac was placed on 2/15.

## 2017-12-09 NOTE — Progress Notes (Addendum)
Previous Prevena with dead battery, cord not in patient room. Verbal order per Dr. Broadus John to replace Prevena.   New Prevena wound vac obtained from OR. Dressing remains intact. Tubing, cannister, and prevena vac changed. Previous prevena vac as well as new prevena box with charging cord placed in pt closet in 4E19.   Fritz Pickerel, RN

## 2017-12-09 NOTE — Progress Notes (Signed)
Pt pleasantly confused at time of medication administration. Thinks he is on a [employer] work site. Continues to emphasize he will "take his pills when he gets home". Able to administer some medications, pt refused colace and iron.   Fritz Pickerel, RN

## 2017-12-09 NOTE — Progress Notes (Addendum)
PROGRESS NOTE    [name] [name]  EVO:[id number] DOB: 1933/05/21 DOA: 12/05/2017 PCP: [name], MD  Brief Narrative:Horacio SUMEDH SHINSATO is a 84 y.o. male with medical history significant of UC s/p total colectomy; afib not on AC; HTN; HLD; DM; stage 3 CKD; and chronic diastolic CHF presenting with AMS.  Patient had a wound dehiscence of his left ankle with hardware removal by Dr. Sharol Given on 2/15; a Praveena wound vac was placed.  He was returned to [facility] on Doxycycline.  The medication was changed due to development of a rash and he was then started on Clindamycin.  The brother reports that the patient looked normal last night, but this AM he had diffuse edema of the face and body as well as diffuse erythema.  He was also quite altered Admitted with Anaphylaxis and AKI, he was hypotensive in ED, started on IVF, steroids, benadryl, Pepcid  Assessment & Plan:   Allergic drug reaction/Anaphylaxis/Hypotension -Admitted with diffuse erythematous drug rash following recent Rx with 2days of Doxy and 1day of Clindamycin prior to admission -rash much improved, still has erythema limited to arms but rest of the body is much better -was on IVF for 48hours, BP much improved -off IVF and tapering steroids  Encephalopathy/Lethargy /Leukocytosis  -increase lethargy and somnolence since 2/22 late morning -WBC climbing, initially I attributed his leukocytosis to steroids and allergic reaction in absence of fevers and overall stability -not on sedating meds -however now WBC climbing to 26K today, no fevers, postop site has no erythema or discharge tenderness etc, urinalysis is unremarkable, chest x-ray is negative, blood cultures from yesterday pending, with worsening mentation -Infectious disease consulted  -no diarrhea  Acute renal failure on CKD3 with metabolic acidosis, hyperkalemia -Most likely secondary to allergic reaction and hypotension -Creatinine improving,   almost back to baseline  -Stopped  IV fluids, clinically he is slightly volume overloaded, baseline creatinine around 1.4-1.5  Wound dehiscence of his left ankle -Status post hardware removal by Dr. Sharol Given on 2/15 and wound VAC placement -While previously recommended for the patient to remain on antibiotics, his foot appears to be healing well with wound vac; no clinical signs of infection at this time  -Remains off antibiotics  -Continue wound VAC, CAM walker is on; and the patient is non-weight bearing,  followed by Dr. Sharol Given as well  -recommended to continue elevation and strict nonweightbearing   P.Afib -Rate control with Amiodarone -chadsvasc score >, not on anticoagulation at baseline due to h/o GI bleed -in NSR at this time   acute on chronic systolic and diastolic CHF -Clinically compensated, diuretics on hold, blood pressure still soft -EF 40-45% with grade 1 diastolic dysfunction in 2/99 -Clinically appears volume overloaded however due to poor mentation and decreased by mouth intake, will only give single dose of IV Lasix now and reassess -Off IV fluids for almost 36 hours  DM -Actos and Onglyza on hold -continue SSI,  -A1c in 3/18 was 6.5 -Cut down dose   -History of ulcerative colitis - s/p total colectomy, remote -Colostomy care  DVT prophylaxis:  Lovenox Code Status:  DNR - Family Communication: d/w sister yesterday, no family at bedside Disposition Plan:  Back to SNF once clinically improved  Consultants:   Orthopedics Dr. Sharol Given   Procedures:   Antimicrobials:    Subjective: -Confused overnight, has mittens on  -Poor by mouth intake  -No diarrhea, remains afebrile   Objective: Vitals:   12/08/17 2201 12/08/17 2328 12/09/17 3716 12/09/17 0745  BP: (!) 95/56 94/66 (!) 138/101 136/63  Pulse: 89 (!) 136 97 82  Resp: (!) 24 20 (!) 21 (!) 33  Temp: 98.5 F (36.9 C) 98.1 F (36.7 C) 98.3 F (36.8 C)   TempSrc: Oral Oral Oral Oral  SpO2:      Weight:      Height:         Intake/Output Summary (Last 24 hours) at 12/09/2017 1222 Last data filed at 12/09/2017 0436 Gross per 24 hour  Intake 170 ml  Output 850 ml  Net -680 ml   Filed Weights   12/05/17 1001  Weight: 102.1 kg (225 lb)    Examination: Gen: somnolent, easily arousable, oriented to self and only partly to place, intermittent confusion HEENT: PERRLA, positive JVD Lungs: Decreased breath sounds at bases CVS: S1-S2 regular rate rhythm  Abd: soft, Non tender, non distended, BS present, positive colostomy Extremities: Left foot and CAM walker with wound VAC, no evidence of erythema/ edema tenderness or discharge Skin: Skin rash overall improving, erythema limited to arms only   Data Reviewed:   CBC: Recent Labs  Lab 12/05/17 1318 12/05/17 1506 12/06/17 0358 12/07/17 0556 12/08/17 0414 12/09/17 0238  WBC 20.0*  --  18.1* 18.8* 19.6* 26.4*  NEUTROABS 16.2*  --   --   --   --   --   HGB 10.7* 11.2* 10.8* 9.8* 10.5* 10.7*  HCT 32.2* 33.0* 33.3* 29.5* 31.8* 32.6*  MCV 93.9  --  92.2 91.3 91.4 91.6  PLT 202  --  260 244 262 415   Basic Metabolic Panel: Recent Labs  Lab 12/05/17 1506 12/06/17 0358 12/07/17 0556 12/08/17 0414 12/09/17 0238  NA 132* 132* 134* 137 138  K 6.0* 5.8* 5.3* 5.2* 4.7  CL 105 107 108 111 112*  CO2  --  16* 17* 18* 19*  GLUCOSE 184* 217* 155* 180* 100*  BUN 73* 84* 78* 63* 59*  CREATININE 2.60* 2.20* 1.85* 1.58* 1.62*  CALCIUM  --  7.7* 7.7* 8.1* 8.1*   GFR: Estimated Creatinine Clearance: 40.7 mL/min (A) (by C-G formula based on SCr of 1.62 mg/dL (H)). Liver Function Tests: No results for input(s): AST, ALT, ALKPHOS, BILITOT, PROT, ALBUMIN in the last 168 hours. No results for input(s): LIPASE, AMYLASE in the last 168 hours. No results for input(s): AMMONIA in the last 168 hours. Coagulation Profile: No results for input(s): INR, PROTIME in the last 168 hours. Cardiac Enzymes: No results for input(s): CKTOTAL, CKMB, CKMBINDEX, TROPONINI in the  last 168 hours. BNP (last 3 results) No results for input(s): PROBNP in the last 8760 hours. HbA1C: No results for input(s): HGBA1C in the last 72 hours. CBG: Recent Labs  Lab 12/08/17 1119 12/08/17 1655 12/08/17 2159 12/09/17 0632 12/09/17 1129  GLUCAP 136* 151* 105* 85 131*   Lipid Profile: No results for input(s): CHOL, HDL, LDLCALC, TRIG, CHOLHDL, LDLDIRECT in the last 72 hours. Thyroid Function Tests: No results for input(s): TSH, T4TOTAL, FREET4, T3FREE, THYROIDAB in the last 72 hours. Anemia Panel: No results for input(s): VITAMINB12, FOLATE, FERRITIN, TIBC, IRON, RETICCTPCT in the last 72 hours. Urine analysis:    Component Value Date/Time   COLORURINE YELLOW 12/08/2017 1600   APPEARANCEUR HAZY (A) 12/08/2017 1600   LABSPEC 1.018 12/08/2017 1600   PHURINE 8.0 12/08/2017 1600   GLUCOSEU NEGATIVE 12/08/2017 1600   HGBUR SMALL (A) 12/08/2017 1600   BILIRUBINUR NEGATIVE 12/08/2017 1600   KETONESUR NEGATIVE 12/08/2017 1600   PROTEINUR NEGATIVE 12/08/2017 1600  UROBILINOGEN 0.2 10/22/2014 1052   NITRITE NEGATIVE 12/08/2017 1600   LEUKOCYTESUR MODERATE (A) 12/08/2017 1600   Sepsis Labs: @LABRCNTIP (procalcitonin:4,lacticidven:4)  ) Recent Results (from the past 240 hour(s))  MRSA PCR Screening     Status: Abnormal   Collection Time: 12/05/17 10:14 PM  Result Value Ref Range Status   MRSA by PCR POSITIVE (A) NEGATIVE Final    Comment:        The GeneXpert MRSA Assay (FDA approved for NASAL specimens only), is one component of a comprehensive MRSA colonization surveillance program. It is not intended to diagnose MRSA infection nor to guide or monitor treatment for MRSA infections. RESULT CALLED TO, READ BACK BY AND VERIFIED WITHBelenda Cruise RN 12/06/17 0319 JDW Performed at [facility], [address] [place], [place] [address]          Radiology Studies: Dg Chest 2 View  Result Date: 12/08/2017 CLINICAL DATA:  Altered mental status.   Productive cough. EXAM: CHEST  2 VIEW COMPARISON:  01/04/2017 FINDINGS: The patient is hunched over to the left in a semi erect projection. This results in asymmetric distribution of soft tissues which is believed to at least partially explain why the left hemithorax appears more dense than the right. Upper zone pulmonary vascular prominence with mild enlargement of the cardiopericardial silhouette suggesting pulmonary venous hypertension. Kerley B lines are present suggesting mild interstitial edema. There is blunting of both posterior costophrenic angles suggesting small bilateral pleural effusions. Lower thoracic kyphosis noted, stable. IMPRESSION: 1. Kerley B-lines and pulmonary venous hypertension with mild cardiomegaly. The appearance is compatible with mild interstitial edema. 2. The most of the hemithoracic asymmetry is due to the distribution of soft tissues of the chest wall given that the patient is hunched over to the left. 3.  Aortic Atherosclerosis (ICD10-I70.0). 4. Small bilateral pleural effusions. Electronically Signed   By: Van Clines M.D.   On: 12/08/2017 16:56        Scheduled Meds: . amiodarone  200 mg Oral Daily  . aspirin EC  81 mg Oral Daily  . buPROPion  75 mg Oral Daily  . docusate sodium  100 mg Oral BID  . enoxaparin (LOVENOX) injection  40 mg Subcutaneous Q24H  . ferrous sulfate  325 mg Oral BID WC  . insulin aspart  0-15 Units Subcutaneous TID WC  . insulin aspart  0-5 Units Subcutaneous QHS  . mupirocin ointment   Nasal BID  . predniSONE  30 mg Oral Q breakfast  . simvastatin  20 mg Oral QHS  . sodium chloride flush  3 mL Intravenous Q12H   Continuous Infusions:    LOS: 3 days    Time spent: 24mn    PDomenic Polite MD [facility] Page via [email or web address], password [email or web address] After 7PM please contact night-coverage  12/09/2017, 12:22 PM

## 2017-12-09 NOTE — Progress Notes (Signed)
Patient went into afib RVR. Paged on call [facility]ad. NS 500 ml bolus given and 0.25 mg digoxin given as well. Patient's heart rate continued to be 140-150's. Paged on call back, amio PO ordered but this RN felt that it was not safe to give this patient any PO meds at this time. Patient has been repositioned several times but continues to lean to the left. Order for 150 bolus of amio given.   Patient placed in mitts due to him pulling at his gown, telemetry box, etc.   Will continue to monitor

## 2017-12-10 DIAGNOSIS — D72829 Elevated white blood cell count, unspecified: Secondary | ICD-10-CM | POA: Diagnosis not present

## 2017-12-10 DIAGNOSIS — D649 Anemia, unspecified: Secondary | ICD-10-CM | POA: Diagnosis not present

## 2017-12-10 LAB — GLUCOSE, CAPILLARY
Glucose-Capillary: 134 mg/dL — ABNORMAL HIGH (ref 65–99)
Glucose-Capillary: 145 mg/dL — ABNORMAL HIGH (ref 65–99)
Glucose-Capillary: 181 mg/dL — ABNORMAL HIGH (ref 65–99)
Glucose-Capillary: 158 mg/dL — ABNORMAL HIGH (ref 65–99)

## 2017-12-10 LAB — CBC WITH DIFFERENTIAL/PLATELET
Basophils Absolute: 0 10*3/uL (ref 0.0–0.1)
Basophils Relative: 0 %
Eosinophils Absolute: 0.2 10*3/uL (ref 0.0–0.7)
Eosinophils Relative: 1 %
HCT: 36.8 % — ABNORMAL LOW (ref 39.0–52.0)
Hemoglobin: 11.7 g/dL — ABNORMAL LOW (ref 13.0–17.0)
Lymphocytes Relative: 20 %
Lymphs Abs: 4.9 10*3/uL — ABNORMAL HIGH (ref 0.7–4.0)
MCH: 30.6 pg (ref 26.0–34.0)
MCHC: 31.8 g/dL (ref 30.0–36.0)
MCV: 96.3 fL (ref 78.0–100.0)
Monocytes Absolute: 0.7 10*3/uL (ref 0.1–1.0)
Monocytes Relative: 3 %
Neutro Abs: 18.6 10*3/uL — ABNORMAL HIGH (ref 1.7–7.7)
Neutrophils Relative %: 76 %
Platelets: 285 10*3/uL (ref 150–400)
RBC: 3.82 MIL/uL — ABNORMAL LOW (ref 4.22–5.81)
RDW: 15.1 % (ref 11.5–15.5)
WBC: 24.4 10*3/uL — ABNORMAL HIGH (ref 4.0–10.5)

## 2017-12-10 LAB — BASIC METABOLIC PANEL
Anion gap: 11 (ref 5–15)
BUN: 62 mg/dL — ABNORMAL HIGH (ref 6–20)
CO2: 18 mmol/L — ABNORMAL LOW (ref 22–32)
Calcium: 7.9 mg/dL — ABNORMAL LOW (ref 8.9–10.3)
Chloride: 110 mmol/L (ref 101–111)
Creatinine, Ser: 1.85 mg/dL — ABNORMAL HIGH (ref 0.61–1.24)
GFR calc Af Amer: 37 mL/min — ABNORMAL LOW (ref >60)
GFR calc non Af Amer: 32 mL/min — ABNORMAL LOW (ref >60)
Glucose, Bld: 94 mg/dL (ref 65–99)
Potassium: 4.7 mmol/L (ref 3.5–5.1)
Sodium: 139 mmol/L (ref 135–145)

## 2017-12-10 LAB — CBC
HCT: 35.7 % — ABNORMAL LOW (ref 39.0–52.0)
Hemoglobin: 11.4 g/dL — ABNORMAL LOW (ref 13.0–17.0)
MCH: 30.2 pg (ref 26.0–34.0)
MCHC: 31.9 g/dL (ref 30.0–36.0)
MCV: 94.4 fL (ref 78.0–100.0)
Platelets: 252 10*3/uL (ref 150–400)
RBC: 3.78 MIL/uL — ABNORMAL LOW (ref 4.22–5.81)
RDW: 14.7 % (ref 11.5–15.5)
WBC: 29.8 10*3/uL — ABNORMAL HIGH (ref 4.0–10.5)

## 2017-12-10 LAB — AMMONIA: Ammonia: 32 umol/L (ref 9–35)

## 2017-12-10 LAB — SAVE SMEAR

## 2017-12-10 MED ORDER — FUROSEMIDE 10 MG/ML IJ SOLN
INTRAMUSCULAR | Status: AC
  Filled 2017-12-10: qty 4

## 2017-12-10 MED ORDER — PREDNISONE 20 MG PO TABS
20.0000 mg | ORAL_TABLET | Freq: Every day | ORAL | Status: AC
  Administered 2017-12-10: 20 mg via ORAL
  Filled 2017-12-10: qty 1

## 2017-12-10 MED ORDER — FUROSEMIDE 10 MG/ML IJ SOLN
40.0000 mg | Freq: Once | INTRAMUSCULAR | Status: AC
  Administered 2017-12-10: 40 mg via INTRAVENOUS
  Filled 2017-12-10: qty 4

## 2017-12-10 NOTE — Progress Notes (Addendum)
PROGRESS NOTE    [name] [name]  LXB:[id number] DOB: [birth year] DOA: 12/05/2017 PCP: [name], MD  Brief Narrative:[name] [name] is a 84 y.o. male with medical history significant of UC s/p total colectomy; afib not on AC; HTN; HLD; DM; stage 3 CKD; and chronic diastolic CHF presenting with AMS.  Patient had a wound dehiscence of his left ankle with hardware removal by Dr. Sharol Given on 2/15; a Praveena wound vac was placed.  He was returned to [facility] on Doxycycline.  The medication was changed due to development of a rash and he was then started on Clindamycin.  The brother reports that the patient looked normal last night, but morning of admission he had diffuse edema of the face and body as well as diffuse erythema.  He was also quite altered Admitted with Anaphylaxis and AKI, he was hypotensive in ED, started on IVF, steroids, benadryl, Pepcid -Acute kidney injury and drug rash has improved however mentation and leukocytosis has worsened  Assessment & Plan:   Allergic drug reaction/Anaphylaxis/Hypotension -Admitted with diffuse erythematous drug rash following recent Rx with 2days of Doxy and 1day of Clindamycin prior to admission -rash much improved, still has erythema limited to arms but rest of the body is much better -was on IVF for 48hours, BP much improved -off IVF and tapering off steroids, will stop after today's dose  Encephalopathy/Lethargy /Leukocytosis  -increase lethargy and somnolence since 2/22 late morning -WBC climbing, I attributed his leukocytosis to steroids and allergic reaction in absence of fevers and overall stability -however WBC continues to climb, 29K today, with ? Atypical lymphocytosis, no fevers, postop site has no erythema/discharge/tenderness etc, urinalysis is unremarkable, chest x-ray is negative, blood cultures negative x1day -Unfortunately no improvement in mentation yet, greatly appreciate infectious disease input -Also check ammonia level  Acute  renal failure on CKD3 with metabolic acidosis, hyperkalemia -Most likely secondary to allergic reaction and hypotension -Creatinine improving,   almost back to baseline  -Stopped IV fluids, clinically he is slightly volume overloaded, baseline creatinine around 1.4-1.5  Wound dehiscence of his left ankle -Status post hardware removal by Dr. Sharol Given on 2/15 and wound VAC placement -While previously recommended for the patient to remain on antibiotics, his foot appears to be healing well with wound vac; no clinical signs of infection at this time  -Remains off antibiotics  -Continue wound VAC, CAM walker is on; and the patient is non-weight bearing,  followed by Dr. Sharol Given as well  -recommended to continue elevation and strict nonweightbearing   P.Afib -Rate control with Amiodarone -chadsvasc score >, not on anticoagulation at baseline due to h/o GI bleed -in NSR at this time   acute on chronic systolic and diastolic CHF -Clinically compensated, diuretics on hold, blood pressure still soft -EF 40-45% with grade 1 diastolic dysfunction in 4/16 -Clinically appears volume overloaded however due to poor mentation and decreased by mouth intake, only given gentle doses of Lasix 2 so far   DM -Actos and Onglyza on hold -continue SSI,  -A1c in 3/18 was 6.5 -Cut down dose   -History of ulcerative colitis - s/p total colectomy, remote -Colostomy care  DVT prophylaxis:  Lovenox Code Status:  DNR - Family Communication: d/w sister on Friday, will attempt to contact family again Disposition Plan:  Back to SNF once clinically improved  Consultants:   Orthopedics Dr. Sharol Given   Procedures:   Antimicrobials:    Subjective: -Remains somnolent, intermittently confused, decreased oral intake  Objective: Vitals:   12/10/17  0016 12/10/17 0441 12/10/17 0808 12/10/17 1200  BP: (!) 96/54 (!) 118/43 (!) 157/81 (!) 162/142  Pulse:   81 74  Resp:   (!) 24 13  Temp:  98.8 F (37.1 C)      TempSrc:  Oral Oral Oral  SpO2:   100%   Weight:      Height:        Intake/Output Summary (Last 24 hours) at 12/10/2017 1311 Last data filed at 12/10/2017 0443 Gross per 24 hour  Intake 360 ml  Output 2250 ml  Net -1890 ml   Filed Weights   12/05/17 1001  Weight: 102.1 kg (225 lb)    Examination: Gen: somnolent, easily arousable, oriented to self and only partly to place, intermittent confusion HEENT: PERRLA, positive JVD Lungs: Decreased breath sounds at bases CVS: S1-S2 regular rate rhythm  Abd: soft, Non tender, non distended, BS present, positive colostomy Extremities: Left foot and CAM walker with wound VAC, no evidence of erythema/ edema tenderness or discharge Skin: Skin rash overall improving, erythema limited to arms only   Data Reviewed:   CBC: Recent Labs  Lab 12/05/17 1318  12/07/17 0556 12/08/17 0414 12/09/17 0238 12/09/17 1222 12/10/17 0242 12/10/17 1102  WBC 20.0*   < > 18.8* 19.6* 26.4*  --  29.8* 24.4*  NEUTROABS 16.2*  --   --   --   --  12.8*  --  18.6*  HGB 10.7*   < > 9.8* 10.5* 10.7*  --  11.4* 11.7*  HCT 32.2*   < > 29.5* 31.8* 32.6*  --  35.7* 36.8*  MCV 93.9   < > 91.3 91.4 91.6  --  94.4 96.3  PLT 202   < > 244 262 294  --  252 285   < > = values in this interval not displayed.   Basic Metabolic Panel: Recent Labs  Lab 12/06/17 0358 12/07/17 0556 12/08/17 0414 12/09/17 0238 12/10/17 0242  NA 132* 134* 137 138 139  K 5.8* 5.3* 5.2* 4.7 4.7  CL 107 108 111 112* 110  CO2 16* 17* 18* 19* 18*  GLUCOSE 217* 155* 180* 100* 94  BUN 84* 78* 63* 59* 62*  CREATININE 2.20* 1.85* 1.58* 1.62* 1.85*  CALCIUM 7.7* 7.7* 8.1* 8.1* 7.9*   GFR: Estimated Creatinine Clearance: 35.7 mL/min (A) (by C-G formula based on SCr of 1.85 mg/dL (H)). Liver Function Tests: No results for input(s): AST, ALT, ALKPHOS, BILITOT, PROT, ALBUMIN in the last 168 hours. No results for input(s): LIPASE, AMYLASE in the last 168 hours. Recent Labs  Lab  12/10/17 0926  AMMONIA 32   Coagulation Profile: No results for input(s): INR, PROTIME in the last 168 hours. Cardiac Enzymes: No results for input(s): CKTOTAL, CKMB, CKMBINDEX, TROPONINI in the last 168 hours. BNP (last 3 results) No results for input(s): PROBNP in the last 8760 hours. HbA1C: No results for input(s): HGBA1C in the last 72 hours. CBG: Recent Labs  Lab 12/09/17 1129 12/09/17 1611 12/09/17 2132 12/10/17 0634 12/10/17 1123  GLUCAP 131* 143* 113* 134* 145*   Lipid Profile: No results for input(s): CHOL, HDL, LDLCALC, TRIG, CHOLHDL, LDLDIRECT in the last 72 hours. Thyroid Function Tests: No results for input(s): TSH, T4TOTAL, FREET4, T3FREE, THYROIDAB in the last 72 hours. Anemia Panel: No results for input(s): VITAMINB12, FOLATE, FERRITIN, TIBC, IRON, RETICCTPCT in the last 72 hours. Urine analysis:    Component Value Date/Time   COLORURINE YELLOW 12/08/2017 1600   APPEARANCEUR HAZY (A) 12/08/2017 1600  LABSPEC 1.018 12/08/2017 1600   PHURINE 8.0 12/08/2017 1600   GLUCOSEU NEGATIVE 12/08/2017 1600   HGBUR SMALL (A) 12/08/2017 1600   BILIRUBINUR NEGATIVE 12/08/2017 1600   KETONESUR NEGATIVE 12/08/2017 1600   PROTEINUR NEGATIVE 12/08/2017 1600   UROBILINOGEN 0.2 10/22/2014 1052   NITRITE NEGATIVE 12/08/2017 1600   LEUKOCYTESUR MODERATE (A) 12/08/2017 1600   Sepsis Labs: @LABRCNTIP (procalcitonin:4,lacticidven:4)  ) Recent Results (from the past 240 hour(s))  MRSA PCR Screening     Status: Abnormal   Collection Time: 12/05/17 10:14 PM  Result Value Ref Range Status   MRSA by PCR POSITIVE (A) NEGATIVE Final    Comment:        The GeneXpert MRSA Assay (FDA approved for NASAL specimens only), is one component of a comprehensive MRSA colonization surveillance program. It is not intended to diagnose MRSA infection nor to guide or monitor treatment for MRSA infections. RESULT CALLED TO, READ BACK BY AND VERIFIED WITHBelenda Cruise RN 12/06/17 0319  JDW Performed at [facility], [address] [place], [place] [address]   Culture, blood (routine x 2)     Status: None (Preliminary result)   Collection Time: 12/08/17 12:38 PM  Result Value Ref Range Status   Specimen Description BLOOD LEFT HAND  Final   Special Requests IN PEDIATRIC BOTTLE Blood Culture adequate volume  Final   Culture   Final    NO GROWTH 1 DAY Performed at [facility], [place] [address], [place], [place] [address]    Report Status PENDING  Incomplete  Culture, blood (routine x 2)     Status: None (Preliminary result)   Collection Time: 12/08/17 12:39 PM  Result Value Ref Range Status   Specimen Description BLOOD LEFT WRIST  Final   Special Requests IN PEDIATRIC BOTTLE Blood Culture adequate volume  Final   Culture   Final    NO GROWTH 1 DAY Performed at [facility], [place] [address] [place], [place] [address]    Report Status PENDING  Incomplete  Culture, Urine     Status: Abnormal   Collection Time: 12/08/17  4:00 PM  Result Value Ref Range Status   Specimen Description URINE, CLEAN CATCH  Final   Special Requests   Final    NONE Performed at [facility], [place] [address] [place], [place] [address]    Culture MULTIPLE SPECIES PRESENT, SUGGEST RECOLLECTION (A)  Final   Report Status 12/09/2017 FINAL  Final         Radiology Studies: Dg Chest 2 View  Result Date: 12/08/2017 CLINICAL DATA:  Altered mental status.  Productive cough. EXAM: CHEST  2 VIEW COMPARISON:  01/04/2017 FINDINGS: The patient is hunched over to the left in a semi erect projection. This results in asymmetric distribution of soft tissues which is believed to at least partially explain why the left hemithorax appears more dense than the right. Upper zone pulmonary vascular prominence with mild enlargement of the cardiopericardial silhouette suggesting pulmonary venous hypertension. Kerley B lines are present suggesting mild interstitial edema. There is blunting of  both posterior costophrenic angles suggesting small bilateral pleural effusions. Lower thoracic kyphosis noted, stable. IMPRESSION: 1. Kerley B-lines and pulmonary venous hypertension with mild cardiomegaly. The appearance is compatible with mild interstitial edema. 2. The most of the hemithoracic asymmetry is due to the distribution of soft tissues of the chest wall given that the patient is hunched over to the left. 3.  Aortic Atherosclerosis (ICD10-I70.0). 4.  Small bilateral pleural effusions. Electronically Signed   By: Van Clines M.D.   On: 12/08/2017 16:56        Scheduled Meds: . amiodarone  200 mg Oral Daily  . aspirin EC  81 mg Oral Daily  . buPROPion  75 mg Oral Daily  . docusate sodium  100 mg Oral BID  . enoxaparin (LOVENOX) injection  40 mg Subcutaneous Q24H  . ferrous sulfate  325 mg Oral BID WC  . furosemide      . insulin aspart  0-15 Units Subcutaneous TID WC  . insulin aspart  0-5 Units Subcutaneous QHS  . mupirocin ointment   Nasal BID  . simvastatin  20 mg Oral QHS  . sodium chloride flush  3 mL Intravenous Q12H   Continuous Infusions:    LOS: 4 days    Time spent: 6mn    PDomenic Polite MD [facility] Page via [email or web address], password [email or web address] After 7PM please contact night-coverage  12/10/2017, 1:11 PM  and

## 2017-12-11 DIAGNOSIS — N179 Acute kidney failure, unspecified: Secondary | ICD-10-CM

## 2017-12-11 DIAGNOSIS — T782XXA Anaphylactic shock, unspecified, initial encounter: Secondary | ICD-10-CM

## 2017-12-11 DIAGNOSIS — Z8719 Personal history of other diseases of the digestive system: Secondary | ICD-10-CM

## 2017-12-11 DIAGNOSIS — L270 Generalized skin eruption due to drugs and medicaments taken internally: Secondary | ICD-10-CM

## 2017-12-11 LAB — GLUCOSE, CAPILLARY
Glucose-Capillary: 137 mg/dL — ABNORMAL HIGH (ref 65–99)
Glucose-Capillary: 226 mg/dL — ABNORMAL HIGH (ref 65–99)
Glucose-Capillary: 145 mg/dL — ABNORMAL HIGH (ref 65–99)
Glucose-Capillary: 206 mg/dL — ABNORMAL HIGH (ref 65–99)

## 2017-12-11 LAB — BASIC METABOLIC PANEL
Anion gap: 8 (ref 5–15)
BUN: 63 mg/dL — ABNORMAL HIGH (ref 6–20)
CO2: 22 mmol/L (ref 22–32)
Calcium: 7.4 mg/dL — ABNORMAL LOW (ref 8.9–10.3)
Chloride: 106 mmol/L (ref 101–111)
Creatinine, Ser: 1.96 mg/dL — ABNORMAL HIGH (ref 0.61–1.24)
GFR calc Af Amer: 35 mL/min — ABNORMAL LOW (ref >60)
GFR calc non Af Amer: 30 mL/min — ABNORMAL LOW (ref >60)
Glucose, Bld: 148 mg/dL — ABNORMAL HIGH (ref 65–99)
Potassium: 4.5 mmol/L (ref 3.5–5.1)
Sodium: 136 mmol/L (ref 135–145)

## 2017-12-11 LAB — CBC WITH DIFFERENTIAL/PLATELET
Basophils Absolute: 0 10*3/uL (ref 0.0–0.1)
Basophils Relative: 0 %
Eosinophils Absolute: 0.3 10*3/uL (ref 0.0–0.7)
Eosinophils Relative: 1 %
HCT: 33.7 % — ABNORMAL LOW (ref 39.0–52.0)
Hemoglobin: 10.8 g/dL — ABNORMAL LOW (ref 13.0–17.0)
Lymphocytes Relative: 21 %
Lymphs Abs: 4.9 10*3/uL — ABNORMAL HIGH (ref 0.7–4.0)
MCH: 30.3 pg (ref 26.0–34.0)
MCHC: 32 g/dL (ref 30.0–36.0)
MCV: 94.4 fL (ref 78.0–100.0)
Monocytes Absolute: 0.6 10*3/uL (ref 0.1–1.0)
Monocytes Relative: 3 %
Neutro Abs: 17.4 10*3/uL — ABNORMAL HIGH (ref 1.7–7.7)
Neutrophils Relative %: 75 %
Platelets: 204 10*3/uL (ref 150–400)
RBC: 3.57 MIL/uL — ABNORMAL LOW (ref 4.22–5.81)
RDW: 14.8 % (ref 11.5–15.5)
WBC: 23.1 10*3/uL — ABNORMAL HIGH (ref 4.0–10.5)

## 2017-12-11 LAB — PATHOLOGIST SMEAR REVIEW

## 2017-12-11 MED ORDER — PANTOPRAZOLE SODIUM 40 MG PO TBEC
40.0000 mg | DELAYED_RELEASE_TABLET | Freq: Every day | ORAL | Status: DC
  Administered 2017-12-11 – 2017-12-13 (×3): 40 mg via ORAL
  Filled 2017-12-11 (×3): qty 1

## 2017-12-11 NOTE — Progress Notes (Signed)
[name] [name] for Infectious Disease    Date of Admission:  12/05/2017      ID: [name] [name] is a 84 y.o. 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 He tells me of the medication he takes  Medications:  . amiodarone  200 mg Oral Daily  . aspirin EC  81 mg Oral Daily  . buPROPion  75 mg Oral Daily  . docusate sodium  100 mg Oral BID  . enoxaparin (LOVENOX) injection  40 mg Subcutaneous Q24H  . ferrous sulfate  325 mg Oral BID WC  . insulin aspart  0-15 Units Subcutaneous TID WC  . insulin aspart  0-5 Units Subcutaneous QHS  . mupirocin ointment   Nasal BID  . pantoprazole  40 mg Oral Daily  . simvastatin  20 mg Oral QHS  . sodium chloride flush  3 mL Intravenous Q12H    Objective: Vital signs in last 24 hours: Temp:  [98.3 F (36.8 C)-98.8 F (37.1 C)] 98.8 F (37.1 C) (02/25 0503) Pulse Rate:  [65-76] 65 (02/25 1200) Resp:  [15-21] 21 (02/25 1200) BP: (105-138)/(54-93) 108/60 (02/25 1200) SpO2:  [95 %-99 %] 96 % (02/25 1200) Physical Exam  Constitutional: He is oriented to person, place, and time. He appears well-developed and well-nourished. No distress.  HENT:  Mouth/Throat: Oropharynx is clear and moist. No oropharyngeal exudate.  Cardiovascular: Normal rate, regular rhythm and normal heart sounds. Exam reveals no gallop and no friction rub.  No murmur heard.  Pulmonary/Chest: Effort normal and breath sounds normal. No  respiratory distress. He has no wheezes.  Abdominal: Soft. Bowel sounds are normal. He exhibits no distension. There is no tenderness.  Lymphadenopathy:  He has no cervical adenopathy.  Neurological: He is alert and oriented to person, place, and time.  Skin: dry flaky skin to face and extremities Ext: left foot wound vac in place Psychiatric: He has a normal mood and affect. His behavior is normal.     Lab Results Recent Labs    12/10/17 0242 12/10/17 1102 12/11/17 0735  WBC 29.8* 24.4* 23.1*  HGB 11.4* 11.7* 10.8*  HCT 35.7* 36.8* 33.7*  NA 139  --  136  K 4.7  --  4.5  CL 110  --  106  CO2 18*  --  22  BUN 62*  --  63*  CREATININE 1.85*  --  1.96*    Microbiology: reviewed Studies/Results: No results found.   Assessment/Plan: Leukocytosis = improving. Likely residual effect from steroid burst for drug allergy.   Drug allergy = agree to list doxycycline but also wonder if clindamycin should also be listed his sister reports he did receive doses prior to admit  Hx of gi bleed = family want to ensure he is both on ppi and h2 blocker at his baseline, defer to dr Broadus [name] for discharge planning  Will sign off  [facility] Cell: [phone] Pager: [phone]  12/11/2017, 1:27  PM

## 2017-12-11 NOTE — Progress Notes (Signed)
Patient family concerned about patient not being on Zantac and Prilosec patient with history of bleeding per patient and family. Dr. Broadus John made aware and orders received will monitor patient. Ruddy Swire, Bettina Gavia RN

## 2017-12-11 NOTE — Progress Notes (Signed)
PROGRESS NOTE    [name] [name]  ZYS:[id number] DOB: Sep 26, 1933 DOA: 12/05/2017 PCP: [name], MD  Brief Narrative:[name] [name] is a 84 y.o. male with medical history significant of UC s/p total colectomy; afib not on AC; HTN; HLD; DM; stage 3 CKD; and chronic diastolic CHF presenting with AMS.  Patient had a wound dehiscence of his left ankle with hardware removal by Dr. Sharol Given on 2/15; a Praveena wound vac was placed.  He was returned to [facility] on Doxycycline.  The medication was changed due to development of a rash and he was then started on Clindamycin.  The brother reports that the patient looked normal last night, but morning of admission he had diffuse edema of the face and body as well as diffuse erythema.  He was also quite altered Admitted with Anaphylaxis and AKI, he was hypotensive in ED, started on IVF, steroids, benadryl, Pepcid -Acute kidney injury and drug rash has improved however mentation and leukocytosis has worsened  Assessment & Plan:   Allergic drug reaction/Anaphylaxis/Hypotension -Admitted with diffuse erythematous drug rash following recent Rx with 2days of Doxy and 1day of Clindamycin prior to admission -rash much improved, still has erythema limited to arms but rest of the body is much better -was on IVF for 48hours, BP much improved -Tapered off steroids  Encephalopathy/Lethargy /Leukocytosis  -increase lethargy and somnolence since 2/22 late morning -WBC climbing, I attributed his leukocytosis to steroids and allergic reaction in absence of fevers and overall stability -however WBC continues to climb, 29K yesterday with no fevers, postop site has no erythema/discharge/tenderness etc, urinalysis is unremarkable, chest x-ray is negative, blood cultures negative x2day -Mentation much improved I suspect this is medication induced secondary to steroids and recent antibiotics2  Acute renal failure on CKD3 with metabolic acidosis, hyperkalemia -Most likely  secondary to allergic reaction and hypotension -Creatinine improving,   almost back to baseline  -Stopped IV fluids, clinically he is slightly volume overloaded, baseline creatinine around 1.4-1.5  Wound dehiscence of his left ankle -Status post hardware removal by Dr. Sharol Given on 2/15 and wound VAC placement -While previously recommended for the patient to remain on antibiotics, his foot appears to be healing well with wound vac; no clinical signs of infection at this time  -Remains off antibiotics  -Continue wound VAC, CAM walker is on; and the patient is non-weight bearing,  followed by Dr. Sharol Given as well  -recommended to continue elevation and strict nonweightbearing   P.Afib -Rate control with Amiodarone -chadsvasc score >, not on anticoagulation at baseline due to h/o GI bleed -in NSR at this time   acute on chronic systolic and diastolic CHF -EF 93-23% with grade 1 diastolic dysfunction in 5/57 -Clinically euvolemic now, hold diuretics  DM -Actos and Onglyza on hold -continue SSI,  -A1c in 3/18 was 6.5 -Cut down dose   -History of ulcerative colitis - s/p total colectomy, remote -Colostomy care  DVT prophylaxis:  Lovenox Code Status:  DNR - Family Communication: d/w sister on Sunday Disposition Plan:  Back to SNF  tomorrow if stable  Consultants:   Orthopedics Dr. Sharol Given   Procedures:   Antimicrobials:    Subjective: -Doing much better, alert and awake, talkative, eating breakfast  Objective: Vitals:   12/11/17 0503 12/11/17 0901 12/11/17 1125 12/11/17 1200  BP:  138/76 (!) 117/58 108/60  Pulse:  70 68 65  Resp:    (!) 21  Temp: 98.8 F (37.1 C)     TempSrc: Oral  SpO2:    96%  Weight:      Height:        Intake/Output Summary (Last 24 hours) at 12/11/2017 1419 Last data filed at 12/11/2017 0817 Gross per 24 hour  Intake 960 ml  Output 1800 ml  Net -840 ml   Filed Weights   12/05/17 1001  Weight: 102.1 kg (225 lb)    Examination: Gen:  Awake, Alert, oriented 3, sitting up eating breakfast HEENT: PERRLA, Neck supple, no JVD Lungs: Fine basilar crackles CVS: RRR,no Murmurs Abd: soft, Non tender, non distended, BS present, colostomy present Extremities: Left foot and CAM walker with wound VAC, no evidence of erythema/ edema tenderness or discharge Skin: Rash much improved, has scaling all over now especially over forehead   Data Reviewed:   CBC: Recent Labs  Lab 12/05/17 1318  12/08/17 0414 12/09/17 0238 12/09/17 1222 12/10/17 0242 12/10/17 1102 12/11/17 0735  WBC 20.0*   < > 19.6* 26.4*  --  29.8* 24.4* 23.1*  NEUTROABS 16.2*  --   --   --  12.8*  --  18.6* 17.4*  HGB 10.7*   < > 10.5* 10.7*  --  11.4* 11.7* 10.8*  HCT 32.2*   < > 31.8* 32.6*  --  35.7* 36.8* 33.7*  MCV 93.9   < > 91.4 91.6  --  94.4 96.3 94.4  PLT 202   < > 262 294  --  252 285 204   < > = values in this interval not displayed.   Basic Metabolic Panel: Recent Labs  Lab 12/07/17 0556 12/08/17 0414 12/09/17 0238 12/10/17 0242 12/11/17 0735  NA 134* 137 138 139 136  K 5.3* 5.2* 4.7 4.7 4.5  CL 108 111 112* 110 106  CO2 17* 18* 19* 18* 22  GLUCOSE 155* 180* 100* 94 148*  BUN 78* 63* 59* 62* 63*  CREATININE 1.85* 1.58* 1.62* 1.85* 1.96*  CALCIUM 7.7* 8.1* 8.1* 7.9* 7.4*   GFR: Estimated Creatinine Clearance: 33.7 mL/min (A) (by C-G formula based on SCr of 1.96 mg/dL (H)). Liver Function Tests: No results for input(s): AST, ALT, ALKPHOS, BILITOT, PROT, ALBUMIN in the last 168 hours. No results for input(s): LIPASE, AMYLASE in the last 168 hours. Recent Labs  Lab 12/10/17 0926  AMMONIA 32   Coagulation Profile: No results for input(s): INR, PROTIME in the last 168 hours. Cardiac Enzymes: No results for input(s): CKTOTAL, CKMB, CKMBINDEX, TROPONINI in the last 168 hours. BNP (last 3 results) No results for input(s): PROBNP in the last 8760 hours. HbA1C: No results for input(s): HGBA1C in the last 72 hours. CBG: Recent Labs    Lab 12/10/17 1123 12/10/17 1608 12/10/17 2040 12/11/17 0609 12/11/17 1121  GLUCAP 145* 181* 158* 137* 226*   Lipid Profile: No results for input(s): CHOL, HDL, LDLCALC, TRIG, CHOLHDL, LDLDIRECT in the last 72 hours. Thyroid Function Tests: No results for input(s): TSH, T4TOTAL, FREET4, T3FREE, THYROIDAB in the last 72 hours. Anemia Panel: No results for input(s): VITAMINB12, FOLATE, FERRITIN, TIBC, IRON, RETICCTPCT in the last 72 hours. Urine analysis:    Component Value Date/Time   COLORURINE YELLOW 12/08/2017 1600   APPEARANCEUR HAZY (A) 12/08/2017 1600   LABSPEC 1.018 12/08/2017 1600   PHURINE 8.0 12/08/2017 1600   GLUCOSEU NEGATIVE 12/08/2017 1600   HGBUR SMALL (A) 12/08/2017 1600   BILIRUBINUR NEGATIVE 12/08/2017 1600   KETONESUR NEGATIVE 12/08/2017 1600   PROTEINUR NEGATIVE 12/08/2017 1600   UROBILINOGEN 0.2 10/22/2014 1052   NITRITE NEGATIVE 12/08/2017 1600  LEUKOCYTESUR MODERATE (A) 12/08/2017 1600   Sepsis Labs: @LABRCNTIP (procalcitonin:4,lacticidven:4)  ) Recent Results (from the past 240 hour(s))  MRSA PCR Screening     Status: Abnormal   Collection Time: 12/05/17 10:14 PM  Result Value Ref Range Status   MRSA by PCR POSITIVE (A) NEGATIVE Final    Comment:        The GeneXpert MRSA Assay (FDA approved for NASAL specimens only), is one component of a comprehensive MRSA colonization surveillance program. It is not intended to diagnose MRSA infection nor to guide or monitor treatment for MRSA infections. RESULT CALLED TO, READ BACK BY AND VERIFIED WITHBelenda Cruise RN 12/06/17 0319 JDW Performed at [facility], [address] [place], [place] [address]   Culture, blood (routine x 2)     Status: None (Preliminary result)   Collection Time: 12/08/17 12:38 PM  Result Value Ref Range Status   Specimen Description BLOOD LEFT HAND  Final   Special Requests IN PEDIATRIC BOTTLE Blood Culture adequate volume  Final   Culture   Final    NO GROWTH 2  DAYS Performed at [facility], [place] [address], [place], [place] [address]    Report Status PENDING  Incomplete  Culture, blood (routine x 2)     Status: None (Preliminary result)   Collection Time: 12/08/17 12:39 PM  Result Value Ref Range Status   Specimen Description BLOOD LEFT WRIST  Final   Special Requests IN PEDIATRIC BOTTLE Blood Culture adequate volume  Final   Culture   Final    NO GROWTH 2 DAYS Performed at [facility], [place] [address], [place], [place] [address]    Report Status PENDING  Incomplete  Culture, Urine     Status: Abnormal   Collection Time: 12/08/17  4:00 PM  Result Value Ref Range Status   Specimen Description URINE, CLEAN CATCH  Final   Special Requests   Final    NONE Performed at [facility], [place] [address] [place], [place] [address]    Culture MULTIPLE SPECIES PRESENT, SUGGEST RECOLLECTION (A)  Final   Report Status 12/09/2017 FINAL  Final         Radiology Studies: No results found.      Scheduled Meds: . amiodarone  200 mg Oral Daily  . aspirin EC  81 mg Oral Daily  . buPROPion  75 mg Oral Daily  . docusate sodium  100 mg Oral BID  . enoxaparin (LOVENOX) injection  40 mg Subcutaneous Q24H  . ferrous sulfate  325 mg Oral BID WC  . insulin aspart  0-15 Units Subcutaneous TID WC  . insulin aspart  0-5 Units Subcutaneous QHS  . mupirocin ointment   Nasal BID  . pantoprazole  40 mg Oral Daily  . simvastatin  20 mg Oral QHS  . sodium chloride flush  3 mL Intravenous Q12H   Continuous Infusions:    LOS: 5 days    Time spent: 73mn    PDomenic Polite MD [facility] Page via [email or web address], password [email or web address] After 7PM please contact night-coverage  12/11/2017, 2:19 PM

## 2017-12-11 NOTE — Progress Notes (Signed)
Spoke with Dr. Sharol Given regarding Pravena wound vac. Wound Vac battery dead at this time. Orders received to remove wound vac and place dry 4x4 and ace to left ankle wound. Upon removal of Pravena, Medial and lateral ankle with stiches incision clean dry and intact with edges approximated no redness noted. Dressing applied and boot replaced. Will monitor patient. Gedalia Mcmillon, Bettina Gavia RN

## 2017-12-12 LAB — GLUCOSE, CAPILLARY
Glucose-Capillary: 153 mg/dL — ABNORMAL HIGH (ref 65–99)
Glucose-Capillary: 166 mg/dL — ABNORMAL HIGH (ref 65–99)
Glucose-Capillary: 169 mg/dL — ABNORMAL HIGH (ref 65–99)
Glucose-Capillary: 161 mg/dL — ABNORMAL HIGH (ref 65–99)

## 2017-12-12 LAB — BASIC METABOLIC PANEL
Anion gap: 7 (ref 5–15)
BUN: 71 mg/dL — ABNORMAL HIGH (ref 6–20)
CO2: 21 mmol/L — ABNORMAL LOW (ref 22–32)
Calcium: 7.2 mg/dL — ABNORMAL LOW (ref 8.9–10.3)
Chloride: 108 mmol/L (ref 101–111)
Creatinine, Ser: 2.21 mg/dL — ABNORMAL HIGH (ref 0.61–1.24)
GFR calc Af Amer: 30 mL/min — ABNORMAL LOW (ref >60)
GFR calc non Af Amer: 26 mL/min — ABNORMAL LOW (ref >60)
Glucose, Bld: 128 mg/dL — ABNORMAL HIGH (ref 65–99)
Potassium: 4.7 mmol/L (ref 3.5–5.1)
Sodium: 136 mmol/L (ref 135–145)

## 2017-12-12 LAB — CBC
HCT: 29.3 % — ABNORMAL LOW (ref 39.0–52.0)
Hemoglobin: 9.5 g/dL — ABNORMAL LOW (ref 13.0–17.0)
MCH: 30.2 pg (ref 26.0–34.0)
MCHC: 32.4 g/dL (ref 30.0–36.0)
MCV: 93 fL (ref 78.0–100.0)
Platelets: 181 10*3/uL (ref 150–400)
RBC: 3.15 MIL/uL — ABNORMAL LOW (ref 4.22–5.81)
RDW: 14.6 % (ref 11.5–15.5)
WBC: 16.3 10*3/uL — ABNORMAL HIGH (ref 4.0–10.5)

## 2017-12-12 MED ORDER — ENOXAPARIN SODIUM 30 MG/0.3ML ~~LOC~~ SOLN
30.0000 mg | SUBCUTANEOUS | Status: DC
  Administered 2017-12-12: 30 mg via SUBCUTANEOUS
  Filled 2017-12-12: qty 0.3

## 2017-12-12 MED ORDER — SODIUM CHLORIDE 0.9 % IV SOLN
INTRAVENOUS | Status: DC
  Administered 2017-12-12 – 2017-12-13 (×2): via INTRAVENOUS

## 2017-12-12 MED ORDER — CARVEDILOL 6.25 MG PO TABS
6.2500 mg | ORAL_TABLET | Freq: Two times a day (BID) | ORAL | Status: DC
  Administered 2017-12-12 – 2017-12-13 (×3): 6.25 mg via ORAL
  Filled 2017-12-12 (×3): qty 1

## 2017-12-12 NOTE — Progress Notes (Signed)
Occupational Therapy Treatment Patient Details Name: [name] [name] MRN: [id number] DOB: 1933-08-04 Today's Date: 12/12/2017    History of present illness [name] [name] a 84 y.o.malewith medical history significant ofUC s/p total colectomy; afib not on AC; HTN; HLD; DM; stage 3 CKD; and chronic diastolic CHF presenting withAMS. Patient had a wound dehiscence of his left ankle with hardware removal by Dr. Sharol Given on 2/15; a Praveena wound vac was placed. He was returned to [facility] on Doxycycline. The medication was changed due to development of a rash and he was then started on Clindamycin. The brother reports that the patient looked normal last night, but this AM he had diffuse edema of the face and body as well as diffuse erythema. He was also quite altered Admitted with Anaphylaxis and AKI, he was hypotensive in ED, started on IVF, steroids, benadryl, Pepcid   OT comments  Seen as cotreat with PT. Pt with improved cognitive status since last session. Alert and oriented and although slow processing, pt with good participation during session. HR max 121 during session. Due to RLE weakness, apparent B RTC insufficiency and NWB status LLE, pt requires use of maxisky to mobilize OOB. Recommend nursing mobilize pt OOB daily. Discussed approprrate set up of tray to help pt be more independent with self feeding given B shoulder weakness. Will continue to follow per POC and recommend rehab at SNF.   Follow Up Recommendations  SNF;Supervision/Assistance - 24 hour    Equipment Recommendations    TBA at SNF   Recommendations for Other Services      Precautions / Restrictions Precautions Precautions: Fall Precaution Comments: colostomy bag Required Braces or Orthoses: Other Brace/Splint Other Brace/Splint: L cam boot Restrictions LLE Weight Bearing: Non weight bearing       Mobility Bed Mobility Overal bed mobility: Needs Assistance Bed Mobility: Rolling Rolling: Mod assist          General bed mobility comments: Pt using rails to help wiht mobility; mod vc to probel solve to push through RLE when rolling; Max A to roll toward R due to NWB status of LLE  Transfers                 General transfer comment: Utilized maximsky to mobilize OOB    Balance                                           ADL either performed or assessed with clinical judgement   ADL Overall ADL's : Needs assistance/impaired Eating/Feeding: Set up;Sitting Eating/Feeding Details (indicate cue type and reason): Apparent RTC insuficiency B; educated NT on need to support B elbows and move tray close to chest to increase pt's level of independence with sefl feeding Grooming: Moderate assistance                               Functional mobility during ADLs: Total assistance;+2 for physical assistance(maxisky)       Vision       Perception     Praxis      Cognition Arousal/Alertness: Awake/alert Behavior During Therapy: WFL for tasks assessed/performed Overall Cognitive Status: No family/caregiver present to determine baseline cognitive functioning  Awareness: Emergent Problem Solving: Slow processing General Comments: improved cognition since last session. A & O x 3        Exercises Exercises: Other exercises Other Exercises Other Exercises: BUE shoulder AAROM x 10 -pparent RTC insufficiency Other Exercises: B elbow flex/ext x 10 Other Exercises: Rythmic stabilization core strengthening for ADL  @ bed level   Shoulder Instructions       General Comments      Pertinent Vitals/ Pain       Pain Assessment: Faces Pain Score: 0-No pain  Home Living                                          Prior Functioning/Environment              Frequency  Min 1X/week        Progress Toward Goals  OT Goals(current goals can now be found in the care plan section)  Progress  towards OT goals: Progressing toward goals  Acute Rehab OT Goals - goal modified Patient Stated Goal: to get stronger OT Goal Formulation: With patient Time For Goal Achievement: 12/22/17 Potential to Achieve Goals: Fair ADL Goals Pt Will Perform Grooming: with min assist;sitting Pt Will Transfer to Toilet: squat pivot transfer;bedside commode;with max assist Pt Will Perform Toileting - Clothing Manipulation and hygiene: with max assist;sitting/lateral leans Additional ADL Goal #1: Pt will perform bed mobility with max A prior to engaging in ADL activity  Plan Discharge plan remains appropriate    Co-evaluation    PT/OT/SLP Co-Evaluation/Treatment: Yes Reason for Co-Treatment: Complexity of the patient's impairments (multi-system involvement);For patient/therapist safety;To address functional/ADL transfers   OT goals addressed during session: ADL's and self-care;Strengthening/ROM      AM-PAC PT "6 Clicks" Daily Activity     Outcome Measure   Help from another person eating meals?: A Little Help from another person taking care of personal grooming?: A Lot Help from another person toileting, which includes using toliet, bedpan, or urinal?: Total Help from another person bathing (including washing, rinsing, drying)?: A Lot Help from another person to put on and taking off regular upper body clothing?: Total Help from another person to put on and taking off regular lower body clothing?: Total 6 Click Score: 10    End of Session    OT Visit Diagnosis: History of falling (Z91.81);Other symptoms and signs involving cognitive function;Adult, failure to thrive (R62.7)   Activity Tolerance Patient tolerated treatment well   Patient Left in chair;with call bell/phone within reach;with nursing/sitter in room   Nurse Communication Mobility status;Need for lift equipment(Maxisky)        Time: 4982-6415 OT Time Calculation (min): 24 min  Charges: OT General Charges $OT Visit: 1  Visit OT Treatments $Self Care/Home Management : 8-22 mins  [facility], OT/L  [phone] 12/12/2017   Burris Matherne,HILLARY 12/12/2017, 4:45 PM

## 2017-12-12 NOTE — Progress Notes (Signed)
CSW following patient for disposition need. Patient has bed at [facility] and facility is able to take patient back once medically cleared by MD.  Rhea Pink, MSW,  [place]

## 2017-12-12 NOTE — Progress Notes (Signed)
Physical Therapy Treatment Patient Details Name: [name] [name] MRN: [id number] DOB: July 02, 1933 Today's Date: 12/12/2017    History of Present Illness Pt is an 84 y.o. male admitted from SNF on 12/05/17 with AMS; worked up for allergic reaction to drug. Recently had L ankle wound dehiscence with hardware removal on 2/15 with wound vac placement with d/c to SNF. PMH includes UC s/p total colectomy, a-fib, HTN, DM, CKD III, CHF.   PT Comments    Pt demonstrating improved cognition this session, but continues to demonstrate slowed processing requiring increased time and effort for mobility. Pt with significant RLE weakness and decreased ability to maintain LLE NWB precautions; utilized maxisky lift to transfer OOB to recliner. Discussed transfer with RN and NT. Continue to recommend SNF-level therapies. Will follow acutely.   Follow Up Recommendations  SNF     Equipment Recommendations  None recommended by PT    Recommendations for Other Services       Precautions / Restrictions Precautions Precautions: Fall Precaution Comments: colostomy bag Required Braces or Orthoses: Other Brace/Splint Other Brace/Splint: L cam boot Restrictions Weight Bearing Restrictions: Yes LLE Weight Bearing: Non weight bearing    Mobility  Bed Mobility Overal bed mobility: Needs Assistance Bed Mobility: Rolling Rolling: Mod assist         General bed mobility comments: Pt using rails to help wiht mobility; mod vc to probel solve to push through RLE when rolling; Max A to roll toward R due to NWB status of LLE  Transfers Overall transfer level: Needs assistance               General transfer comment: Utilized maximsky to mobilize OOB from bed to recliner  Ambulation/Gait             General Gait Details: Pt with decreased strength in RLE; decreased ability to maintain LLE NWB   Stairs            Wheelchair Mobility    Modified Rankin (Stroke Patients Only)        Balance                                            Cognition Arousal/Alertness: Awake/alert Behavior During Therapy: WFL for tasks assessed/performed Overall Cognitive Status: No family/caregiver present to determine baseline cognitive functioning Area of Impairment: Following commands;Awareness;Problem solving;Attention                   Current Attention Level: Sustained   Following Commands: Follows one step commands inconsistently   Awareness: Emergent Problem Solving: Slow processing General Comments: improved cognition since last session. A & O x 3      Exercises Other Exercises Other Exercises: BUE shoulder AAROM x 10 -pparent RTC insufficiency Other Exercises: B elbow flex/ext x 10 Other Exercises: Rythmic stabilization core strengthening for ADL  @ bed level    General Comments        Pertinent Vitals/Pain Pain Assessment: No/denies pain Pain Score: 0-No pain    Home Living                      Prior Function            PT Goals (current goals can now be found in the care plan section) Acute Rehab PT Goals Patient Stated Goal: to get stronger PT Goal Formulation: With family Time  For Goal Achievement: 12/22/17 Potential to Achieve Goals: Fair Progress towards PT goals: Progressing toward goals    Frequency    Min 2X/week      PT Plan Current plan remains appropriate    Co-evaluation PT/OT/SLP Co-Evaluation/Treatment: Yes Reason for Co-Treatment: Complexity of the patient's impairments (multi-system involvement);For patient/therapist safety;To address functional/ADL transfers PT goals addressed during session: Mobility/safety with mobility OT goals addressed during session: ADL's and self-care;Strengthening/ROM      AM-PAC PT "6 Clicks" Daily Activity  Outcome Measure  Difficulty turning over in bed (including adjusting bedclothes, sheets and blankets)?: Unable Difficulty moving from lying on back to  sitting on the side of the bed? : Unable Difficulty sitting down on and standing up from a chair with arms (e.g., wheelchair, bedside commode, etc,.)?: Unable Help needed moving to and from a bed to chair (including a wheelchair)?: Total Help needed walking in hospital room?: Total Help needed climbing 3-5 steps with a railing? : Total 6 Click Score: 6    End of Session   Activity Tolerance: Patient limited by fatigue Patient left: in chair;with call bell/phone within reach Nurse Communication: Mobility status;Need for lift equipment PT Visit Diagnosis: Other abnormalities of gait and mobility (R26.89);Repeated falls (R29.6);History of falling (Z91.81);Muscle weakness (generalized) (M62.81)     Time: 5035-4656 PT Time Calculation (min) (ACUTE ONLY): 24 min  Charges:  $Therapeutic Activity: 8-22 mins                    G Codes:      Mabeline Caras, PT, DPT Acute Rehab Services  Pager: [place] 12/12/2017, 5:22 PM

## 2017-12-12 NOTE — Progress Notes (Addendum)
PROGRESS NOTE    [name] [name]  URK:[id number] DOB: June 14, 1933 DOA: 12/05/2017 PCP: [name], MD  Brief Narrative:[name] [name] is a 84 y.o. male with medical history significant of UC s/p total colectomy; afib not on AC; HTN; HLD; DM; stage 3 CKD; and chronic diastolic CHF presenting with AMS.  Patient had a wound dehiscence of his left ankle with hardware removal by Dr. Sharol Given on 2/15; a Praveena wound vac was placed.  He was returned to [facility] on Doxycycline.  The medication was changed due to development of a rash and he was then started on Clindamycin.  The brother reports that the patient looked normal last night, but morning of admission he had diffuse edema of the face and body as well as diffuse erythema.  He was also quite altered Admitted with Anaphylaxis and AKI, he was hypotensive in ED, started on IVF, steroids, benadryl, Pepcid -Acute kidney injury and drug rash has improved however mentation and leukocytosis has worsened  Assessment & Plan:   Allergic drug reaction/Anaphylaxis/Hypotension -Admitted with diffuse erythematous drug rash following recent Rx with 2days of Doxycycline and 1day of Clindamycin prior to admission -now both added to list of allergies -rash much improved, and skin scaled off -was on IVF for 48hours initially -Tapered off steroids  Encephalopathy/Lethargy /Leukocytosis  -improved  -had mild confusion on admission and then had worsening lethargy and somnolence since 2/22 late morning -WBC worsened, I attributed his leukocytosis to steroids and allergic reaction in absence of fevers and overall stability -WBC continued to climb to 29K and now finally improving, 16K now -no evidence of infection, no fevers, postop site has no erythema/discharge/tenderness etc, urinalysis is unremarkable, chest x-ray is negative, blood cultures negative x3day -Mentation much improved I suspect this lethargy was medication induced secondary to steroids and recent  antibiotics  Acute renal failure on CKD3 with metabolic acidosis, hyperkalemia -Most likely secondary to allergic reaction and hypotension, creatinine 2.6 on admission, baseline 1.5 range -Creatinine improved to 1.6 and then trended up again to 2.2 now, likley due to poor PO intake for past few days, couldn't give additional IVF then since he was clinically volume overloaded then and required lasix x2 -now appears slightly dry and will give gentle NS at 50cc/hr for 12 hours  Wound dehiscence of his left ankle -Status post hardware removal by Dr. Sharol Given on 2/15 and wound VAC placement -While previously recommended for the patient to remain on antibiotics, his foot appears to be healing well with wound vac; no clinical signs of infection at this time  -Remains off antibiotics  -Continue wound VAC, CAM walker is on; and the patient is non-weight bearing,  followed by Dr. Sharol Given as well  -recommended to continue elevation and strict nonweightbearing   P.Afib -continue with Amiodarone -chadsvasc score >, not on anticoagulation at baseline due to h/o GI bleed -HR slightly uncontrolled, started low dose Coreg, monitor   acute on chronic systolic and diastolic CHF -EF 83-15% with grade 1 diastolic dysfunction in 1/76 -Clinically euvolemic now, hold diuretics  DM -Actos and Onglyza on hold -continue SSI,  -A1c in 3/18 was 6.5 -Cut down dose   -History of ulcerative colitis - s/p total colectomy, remote -Colostomy care  DVT prophylaxis:  Lovenox Code Status:  DNR - Family Communication: d/w sister Friday, Sunday and Monday Disposition Plan:  Back to SNF  tomorrow if stable, creatinine better  Consultants:   Orthopedics Dr. Sharol Given   Procedures:   Antimicrobials:    Subjective: -  Feels better, sitting up in bed eating breakfast, wondering if he will go back to rehabilitation today  Objective: Vitals:   12/12/17 0416 12/12/17 0744 12/12/17 0800 12/12/17 1109  BP: (!) 124/104  (!) 90/56 111/71 105/74  Pulse: (!) 113 94 99 (!) 101  Resp: 19 17 17 18   Temp: 98.3 F (36.8 C) 97.6 F (36.4 C)  98.2 F (36.8 C)  TempSrc: Oral Oral  Oral  SpO2: 93% 96% 95% 97%  Weight:      Height:        Intake/Output Summary (Last 24 hours) at 12/12/2017 1410 Last data filed at 12/12/2017 1113 Gross per 24 hour  Intake 3 ml  Output 750 ml  Net -747 ml   Filed Weights   12/05/17 1001  Weight: 102.1 kg (225 lb)    Examination: Gen: Awake, Alert, Oriented X 3, no bruit, chronically ill-appearing male HEENT: PERRLA, Neck supple, no JVD Lungs: Clear bilaterally CVS: RRR,No Gallops,Rubs or new Murmurs Abd: soft, Non tender, non distended, BS present Extremities: Left foot with Cam Walker and wound VAC, no evidence of erythema, tenderness, edema or discharge Skin: Rash has resolved, now has scaling of skin of his forehead, arms and legs   Data Reviewed:   CBC: Recent Labs  Lab 12/09/17 0238 12/09/17 1222 12/10/17 0242 12/10/17 1102 12/11/17 0735 12/12/17 0219  WBC 26.4*  --  29.8* 24.4* 23.1* 16.3*  NEUTROABS  --  12.8*  --  18.6* 17.4*  --   HGB 10.7*  --  11.4* 11.7* 10.8* 9.5*  HCT 32.6*  --  35.7* 36.8* 33.7* 29.3*  MCV 91.6  --  94.4 96.3 94.4 93.0  PLT 294  --  252 285 204 009   Basic Metabolic Panel: Recent Labs  Lab 12/08/17 0414 12/09/17 0238 12/10/17 0242 12/11/17 0735 12/12/17 0219  NA 137 138 139 136 136  K 5.2* 4.7 4.7 4.5 4.7  CL 111 112* 110 106 108  CO2 18* 19* 18* 22 21*  GLUCOSE 180* 100* 94 148* 128*  BUN 63* 59* 62* 63* 71*  CREATININE 1.58* 1.62* 1.85* 1.96* 2.21*  CALCIUM 8.1* 8.1* 7.9* 7.4* 7.2*   GFR: Estimated Creatinine Clearance: 29.9 mL/min (A) (by C-G formula based on SCr of 2.21 mg/dL (H)). Liver Function Tests: No results for input(s): AST, ALT, ALKPHOS, BILITOT, PROT, ALBUMIN in the last 168 hours. No results for input(s): LIPASE, AMYLASE in the last 168 hours. Recent Labs  Lab 12/10/17 0926  AMMONIA 32    Coagulation Profile: No results for input(s): INR, PROTIME in the last 168 hours. Cardiac Enzymes: No results for input(s): CKTOTAL, CKMB, CKMBINDEX, TROPONINI in the last 168 hours. BNP (last 3 results) No results for input(s): PROBNP in the last 8760 hours. HbA1C: No results for input(s): HGBA1C in the last 72 hours. CBG: Recent Labs  Lab 12/11/17 1121 12/11/17 1642 12/11/17 2126 12/12/17 0600 12/12/17 1107  GLUCAP 226* 145* 206* 153* 166*   Lipid Profile: No results for input(s): CHOL, HDL, LDLCALC, TRIG, CHOLHDL, LDLDIRECT in the last 72 hours. Thyroid Function Tests: No results for input(s): TSH, T4TOTAL, FREET4, T3FREE, THYROIDAB in the last 72 hours. Anemia Panel: No results for input(s): VITAMINB12, FOLATE, FERRITIN, TIBC, IRON, RETICCTPCT in the last 72 hours. Urine analysis:    Component Value Date/Time   COLORURINE YELLOW 12/08/2017 1600   APPEARANCEUR HAZY (A) 12/08/2017 1600   LABSPEC 1.018 12/08/2017 1600   PHURINE 8.0 12/08/2017 1600   GLUCOSEU NEGATIVE 12/08/2017 1600  HGBUR SMALL (A) 12/08/2017 1600   BILIRUBINUR NEGATIVE 12/08/2017 1600   KETONESUR NEGATIVE 12/08/2017 1600   PROTEINUR NEGATIVE 12/08/2017 1600   UROBILINOGEN 0.2 10/22/2014 1052   NITRITE NEGATIVE 12/08/2017 1600   LEUKOCYTESUR MODERATE (A) 12/08/2017 1600   Sepsis Labs: @LABRCNTIP (procalcitonin:4,lacticidven:4)  ) Recent Results (from the past 240 hour(s))  MRSA PCR Screening     Status: Abnormal   Collection Time: 12/05/17 10:14 PM  Result Value Ref Range Status   MRSA by PCR POSITIVE (A) NEGATIVE Final    Comment:        The GeneXpert MRSA Assay (FDA approved for NASAL specimens only), is one component of a comprehensive MRSA colonization surveillance program. It is not intended to diagnose MRSA infection nor to guide or monitor treatment for MRSA infections. RESULT CALLED TO, READ BACK BY AND VERIFIED WITHBelenda Cruise RN 12/06/17 0319 JDW Performed at [facility], [address] [place], [place] [address]   Culture, blood (routine x 2)     Status: None (Preliminary result)   Collection Time: 12/08/17 12:38 PM  Result Value Ref Range Status   Specimen Description BLOOD LEFT HAND  Final   Special Requests IN PEDIATRIC BOTTLE Blood Culture adequate volume  Final   Culture   Final    NO GROWTH 4 DAYS Performed at [facility], [place] [address], [place], [place] [address]    Report Status PENDING  Incomplete  Culture, blood (routine x 2)     Status: None (Preliminary result)   Collection Time: 12/08/17 12:39 PM  Result Value Ref Range Status   Specimen Description BLOOD LEFT WRIST  Final   Special Requests IN PEDIATRIC BOTTLE Blood Culture adequate volume  Final   Culture   Final    NO GROWTH 4 DAYS Performed at [facility], [place] [address] [place], [place] [address]    Report Status PENDING  Incomplete  Culture, Urine     Status: Abnormal   Collection Time: 12/08/17  4:00 PM  Result Value Ref Range Status   Specimen Description URINE, CLEAN CATCH  Final   Special Requests   Final    NONE Performed at [facility], [place] [address] [place], [place] [address]    Culture MULTIPLE SPECIES PRESENT, SUGGEST RECOLLECTION (A)  Final   Report Status 12/09/2017 FINAL  Final         Radiology Studies: No results found.      Scheduled Meds: . amiodarone  200 mg Oral Daily  . aspirin EC  81 mg Oral Daily  . buPROPion  75 mg Oral Daily  . docusate sodium  100 mg Oral BID  . enoxaparin (LOVENOX) injection  40 mg Subcutaneous Q24H  . ferrous sulfate  325 mg Oral BID WC  . insulin aspart  0-15 Units Subcutaneous TID WC  . insulin aspart  0-5 Units Subcutaneous QHS  . mupirocin ointment   Nasal BID  . pantoprazole  40 mg Oral Daily  . simvastatin  20 mg Oral QHS  . sodium chloride flush  3 mL Intravenous Q12H   Continuous Infusions: . sodium chloride 50 mL/hr at 12/12/17 0843     LOS: 6 days    Time spent:  50mn    PDomenic Polite MD [facility] Page via [email or web address], password [email or web address] After 7PM please contact night-coverage  12/12/2017, 2:10 PM

## 2017-12-12 NOTE — Progress Notes (Signed)
Patient converted to Atrial fib overnight. Dr. Broadus John made aware through [facility] system. Will monitor patient. Terris Germano, Bettina Gavia RN

## 2017-12-12 NOTE — Plan of Care (Signed)
  Progressing Education: Knowledge of General Education information will improve 12/12/2017 0031 - Progressing by Peggye Pitt, RN Health Behavior/Discharge Planning: Ability to manage health-related needs will improve 12/12/2017 0031 - Progressing by Peggye Pitt, RN Skin Integrity: Risk for impaired skin integrity will decrease 12/12/2017 0031 - Progressing by Peggye Pitt, RN

## 2017-12-12 NOTE — Progress Notes (Signed)
Patient with increasing heart rate this afternoon up to 140s on monitor at time and sustaining 120s-130s at times. Patient up to chair this afternoon with PT. Dr. Broadus John made aware and orders received will continue to monitor patient. Gillis Boardley, Bettina Gavia RN

## 2017-12-12 NOTE — Care Management Important Message (Signed)
Important Message  Patient Details  Name: [name] [name] MRN: [id number] Date of Birth: 07-12-1933   Medicare Important Message Given:  Yes(copy left at bedside)    Carles Collet, RN 12/12/2017, 2:27 PM

## 2017-12-12 NOTE — Progress Notes (Signed)
Around 0400, patient went into afib. HR 110-120s. Patient asymptomatic and in no acute distress. Will continue to monitor

## 2017-12-13 DIAGNOSIS — T50901A Poisoning by unspecified drugs, medicaments and biological substances, accidental (unintentional), initial encounter: Secondary | ICD-10-CM | POA: Diagnosis not present

## 2017-12-13 DIAGNOSIS — I5043 Acute on chronic combined systolic (congestive) and diastolic (congestive) heart failure: Secondary | ICD-10-CM | POA: Diagnosis not present

## 2017-12-13 DIAGNOSIS — L988 Other specified disorders of the skin and subcutaneous tissue: Secondary | ICD-10-CM | POA: Diagnosis not present

## 2017-12-13 DIAGNOSIS — S8292XA Unspecified fracture of left lower leg, initial encounter for closed fracture: Secondary | ICD-10-CM | POA: Diagnosis not present

## 2017-12-13 DIAGNOSIS — M79609 Pain in unspecified limb: Secondary | ICD-10-CM | POA: Diagnosis not present

## 2017-12-13 DIAGNOSIS — I2510 Atherosclerotic heart disease of native coronary artery without angina pectoris: Secondary | ICD-10-CM | POA: Diagnosis not present

## 2017-12-13 DIAGNOSIS — M6281 Muscle weakness (generalized): Secondary | ICD-10-CM | POA: Diagnosis not present

## 2017-12-13 DIAGNOSIS — I10 Essential (primary) hypertension: Secondary | ICD-10-CM | POA: Diagnosis not present

## 2017-12-13 DIAGNOSIS — S31000A Unspecified open wound of lower back and pelvis without penetration into retroperitoneum, initial encounter: Secondary | ICD-10-CM | POA: Diagnosis not present

## 2017-12-13 DIAGNOSIS — L89322 Pressure ulcer of left buttock, stage 2: Secondary | ICD-10-CM | POA: Diagnosis not present

## 2017-12-13 DIAGNOSIS — T7840XD Allergy, unspecified, subsequent encounter: Secondary | ICD-10-CM | POA: Diagnosis not present

## 2017-12-13 DIAGNOSIS — I480 Paroxysmal atrial fibrillation: Secondary | ICD-10-CM | POA: Diagnosis not present

## 2017-12-13 DIAGNOSIS — D649 Anemia, unspecified: Secondary | ICD-10-CM | POA: Diagnosis not present

## 2017-12-13 DIAGNOSIS — M6389 Disorders of muscle in diseases classified elsewhere, multiple sites: Secondary | ICD-10-CM | POA: Diagnosis not present

## 2017-12-13 DIAGNOSIS — S82852D Displaced trimalleolar fracture of left lower leg, subsequent encounter for closed fracture with routine healing: Secondary | ICD-10-CM | POA: Diagnosis not present

## 2017-12-13 DIAGNOSIS — M542 Cervicalgia: Secondary | ICD-10-CM | POA: Diagnosis not present

## 2017-12-13 DIAGNOSIS — G629 Polyneuropathy, unspecified: Secondary | ICD-10-CM | POA: Diagnosis not present

## 2017-12-13 DIAGNOSIS — R41841 Cognitive communication deficit: Secondary | ICD-10-CM | POA: Diagnosis not present

## 2017-12-13 DIAGNOSIS — I950 Idiopathic hypotension: Secondary | ICD-10-CM | POA: Diagnosis not present

## 2017-12-13 DIAGNOSIS — D6489 Other specified anemias: Secondary | ICD-10-CM | POA: Diagnosis not present

## 2017-12-13 DIAGNOSIS — R2681 Unsteadiness on feet: Secondary | ICD-10-CM | POA: Diagnosis not present

## 2017-12-13 DIAGNOSIS — R410 Disorientation, unspecified: Secondary | ICD-10-CM | POA: Diagnosis not present

## 2017-12-13 DIAGNOSIS — R278 Other lack of coordination: Secondary | ICD-10-CM | POA: Diagnosis not present

## 2017-12-13 DIAGNOSIS — E119 Type 2 diabetes mellitus without complications: Secondary | ICD-10-CM | POA: Diagnosis not present

## 2017-12-13 LAB — BASIC METABOLIC PANEL
Anion gap: 6 (ref 5–15)
BUN: 65 mg/dL — ABNORMAL HIGH (ref 6–20)
CO2: 20 mmol/L — ABNORMAL LOW (ref 22–32)
Calcium: 7.3 mg/dL — ABNORMAL LOW (ref 8.9–10.3)
Chloride: 108 mmol/L (ref 101–111)
Creatinine, Ser: 2.11 mg/dL — ABNORMAL HIGH (ref 0.61–1.24)
GFR calc Af Amer: 32 mL/min — ABNORMAL LOW (ref >60)
GFR calc non Af Amer: 28 mL/min — ABNORMAL LOW (ref >60)
Glucose, Bld: 146 mg/dL — ABNORMAL HIGH (ref 65–99)
Potassium: 4.5 mmol/L (ref 3.5–5.1)
Sodium: 134 mmol/L — ABNORMAL LOW (ref 135–145)

## 2017-12-13 LAB — CULTURE, BLOOD (ROUTINE X 2)
Culture: NO GROWTH
Culture: NO GROWTH
Special Requests: ADEQUATE
Special Requests: ADEQUATE

## 2017-12-13 LAB — GLUCOSE, CAPILLARY
Glucose-Capillary: 140 mg/dL — ABNORMAL HIGH (ref 65–99)
Glucose-Capillary: 165 mg/dL — ABNORMAL HIGH (ref 65–99)
Glucose-Capillary: 146 mg/dL — ABNORMAL HIGH (ref 65–99)

## 2017-12-13 LAB — CBC
HCT: 30.1 % — ABNORMAL LOW (ref 39.0–52.0)
Hemoglobin: 9.9 g/dL — ABNORMAL LOW (ref 13.0–17.0)
MCH: 31 pg (ref 26.0–34.0)
MCHC: 32.9 g/dL (ref 30.0–36.0)
MCV: 94.4 fL (ref 78.0–100.0)
Platelets: 174 10*3/uL (ref 150–400)
RBC: 3.19 MIL/uL — ABNORMAL LOW (ref 4.22–5.81)
RDW: 15.1 % (ref 11.5–15.5)
WBC: 21.6 10*3/uL — ABNORMAL HIGH (ref 4.0–10.5)

## 2017-12-13 MED ORDER — SAXAGLIPTIN HCL 5 MG PO TABS: 2.5000 mg | ORAL_TABLET | Freq: Every day | ORAL | 1 refills | Status: DC

## 2017-12-13 MED ORDER — ENOXAPARIN SODIUM 40 MG/0.4ML ~~LOC~~ SOLN: 40.0000 mg | SUBCUTANEOUS | Status: DC

## 2017-12-13 MED ORDER — CARVEDILOL 6.25 MG PO TABS: 6.2500 mg | ORAL_TABLET | Freq: Two times a day (BID) | ORAL | Status: DC

## 2017-12-13 NOTE — Progress Notes (Signed)
Patient converted to sinus rhythm around 2245. Patient's heart rate has been in the 60's with occasional high 50's. Will continue to monitor

## 2017-12-13 NOTE — Progress Notes (Signed)
Patient heart rate dropped to 38. Back up to 60's. Will continue to monitor

## 2017-12-13 NOTE — Plan of Care (Signed)
  Progressing Clinical Measurements: Will remain free from infection 12/13/2017 0309 - Progressing by Peggye Pitt, RN Skin Integrity: Risk for impaired skin integrity will decrease 12/13/2017 0309 - Progressing by Peggye Pitt, RN

## 2017-12-13 NOTE — Progress Notes (Signed)
Clinical Social Worker facilitated patient discharge including contacting patient family and facility to confirm patient discharge plans.  Clinical information faxed to facility and family agreeable with plan.  CSW arranged ambulance transport via [facility] to [facility]  .  RN to call [phone] (pt will go in room 102)  report prior to discharge.  Clinical Social Worker will sign off for now as social work intervention is no longer needed. Please consult [place] again if new need arises.  Rhea Pink, MSW, [place]

## 2017-12-13 NOTE — Discharge Summary (Signed)
Physician Discharge Summary  [name] [name] SNK:[id number] DOB: [birth year] DOA: 12/05/2017  PCP: [name], MD  Admit date: 12/05/2017 Discharge date: 12/13/2017  Time spent: minutes  Recommendations for Outpatient Follow-up:  Avoid doxycycline and clindamycin Monitor wbc levels patient is off steroids  Discharge Diagnoses:  Principal Problem:   Allergic reaction caused by a drug Active Problems:   Chronic combined systolic and diastolic CHF (congestive heart failure) ([name])   Paroxysmal atrial fibrillation ([name])   Diabetes mellitus with diabetic nephropathy without long-term current use of insulin ([place])   Hardware complicating wound infection ([place])   Acute renal failure superimposed on stage 3 chronic kidney disease ([name])   Metabolic acidosis   Hypotension   Acute metabolic encephalopathy   AKI (acute kidney injury) ([place])   Anaphylactic syndrome   Drug-induced skin rash   Discharge Condition: stable  Diet recommendation: Carb modified diet.  Filed Weights   12/05/17 1001  Weight: 102.1 kg (225 lb)    History of present illness:  [name] [name] a 84 y.o.malewith medical history significant ofUC s/p total colectomy; afib not on AC; HTN; HLD; DM; stage 3 CKD; and chronic diastolic CHF presenting withAMS. Patient had a wound dehiscence of his left ankle with hardware removal by Dr. Sharol Given on 2/15; a Praveena wound vac was placed. He was returned to [facility] on Doxycycline. The medication was changed due to development of a rash and he was then started on Clindamycin. The brother reports that the patient looked normal last night, but morning of admission he had diffuse edema of the face and body as well as diffuse erythema. He was also quite altered Admitted with Anaphylaxis and AKI, he was hypotensive in ED, started on IVF, steroids, benadryl, Pepcid  Hospital Course:  Per prior PN from yesterdays provider.  Allergic drug reaction/Anaphylaxis/Hypotension -  Admitted with diffuse erythematous drug rash following recent Rx with 2days of Doxycycline and 1day of Clindamycin prior to admission -now both added to list of allergies -rash much improved, and skin scaled off -was on IVF for 48hours initially -Tapered off steroids  Encephalopathy/Lethargy /Leukocytosis  -improved  -had mild confusion on admission and then had worsening lethargy and somnolence since 2/22 late morning -WBC worsened, I attributed his leukocytosis to steroids and allergic reaction in absence of fevers and overall stability -no evidence of infection, no fevers, postop site has no erythema/discharge/tenderness etc, urinalysis is unremarkable, chest x-ray is negative, blood cultures negative x3day -Mentation much improved I suspect this lethargy was medication induced secondary to steroids and recent antibiotics  Acute renal failure on CKD3 with metabolic acidosis, hyperkalemia -Most likely secondary to allergic reaction and hypotension, creatinine 2.6 on admission, baseline 1.5 range -Creatinine improved to 1.6 and then trended up again to 2.2 now, likley due to poor PO intake for past few days, couldn't give additional IVF then since he was clinically volume overloaded then and required lasix x2  Wound dehiscence of his left ankle -Status post hardware removal by Dr. Sharol Given on 2/15 and wound VAC placement -While previously recommended for the patient to remain on antibiotics, his foot appears to be healing well with wound vac; no clinical signs of infection at this time  -Remains off antibiotics  -Continue wound VAC, CAM walker is on; and the patient is non-weight bearing,  followed by Dr. Sharol Given as well  -recommended to continue elevation and strict nonweightbearing   P.Afib -continue with Amiodarone -chadsvasc score >2, not on anticoagulation at baseline due to h/o GI bleed -  HR slightly uncontrolled, started low dose Coreg, monitor   acute on chronic systolic and  diastolic CHF -EF 11-03% with grade 1 diastolic dysfunction in 1/59 -Clinically euvolemic now, hold diuretics  -History of ulcerative colitis - s/p total colectomy, remote -Colostomy care  DM - continue prior to admission medication regimen. Cut down dose of saxagliptin due to renal function.  Leukocytosis - no sources of infection identified as such suspect secondary to recent steroid regimen. No fevers  Procedures:  None  Consultations:  ID  Discharge Exam: Vitals:   12/13/17 0957 12/13/17 1219  BP: (!) 106/52 139/64  Pulse: 61 68  Resp:    Temp:    SpO2:      General: Pt in nad, alert and awake Cardiovascular: rrr, no rubs Respiratory: no increased wob, no wheezes  Discharge Instructions   Discharge Instructions    Call MD for:  severe uncontrolled pain   Complete by:  As directed    Call MD for:  temperature >100.4   Complete by:  As directed    Diet - low sodium heart healthy   Complete by:  As directed    Discharge instructions   Complete by:  As directed    Please ensure to follow up with your pcp at SNF   Increase activity slowly   Complete by:  As directed      Allergies as of 12/13/2017      Reactions   Brilinta [ticagrelor] Shortness Of Breath, Other (See Comments)   Changed to Plavix due to SOB   Clindamycin/lincomycin Anaphylaxis   Whole body skin peeling, blisters   Doxycycline Anaphylaxis   Penicillins Rash, Other (See Comments)   Has patient had a PCN reaction causing immediate rash, facial/tongue/throat swelling, SOB or lightheadedness with hypotension:Yes Has patient had a PCN reaction causing severe rash involving mucus membranes or skin necrosis: No Has patient had a PCN reaction that required hospitalization: No Has patient had a PCN reaction occurring within the last 10 years: No If all of the above answers are "NO", then may proceed with Cephalosporin use.   Sulfa Antibiotics Other (See Comments)   Pt does not remember the  reaction, but it sure the allergy exists.      Medication List    STOP taking these medications   hydrOXYzine 25 MG capsule Commonly known as:  VISTARIL     TAKE these medications   acetaminophen 500 MG tablet Commonly known as:  TYLENOL Take 1 tablet (500 mg total) by mouth every 6 (six) hours as needed (for pain or headaches). Available over the counter   amiodarone 200 MG tablet Commonly known as:  PACERONE Take 1 tablet (200 mg total) by mouth daily.   aspirin 81 MG tablet Take 81 mg by mouth daily.   buPROPion 75 MG tablet Commonly known as:  WELLBUTRIN Take 75 mg by mouth daily.   calcium carbonate 600 MG Tabs tablet Commonly known as:  OS-CAL Take 600 mg by mouth daily with breakfast.   carvedilol 6.25 MG tablet Commonly known as:  COREG Take 1 tablet (6.25 mg total) by mouth 2 (two) times daily with a meal.   cholecalciferol 1000 units tablet Commonly known as:  VITAMIN D Take 3,000 Units by mouth daily.   docusate sodium 100 MG capsule Commonly known as:  COLACE Take 1 capsule (100 mg total) by mouth 2 (two) times daily.   ferrous sulfate 325 (65 FE) MG tablet Take 1 tablet (325 mg total) by mouth  2 (two) times daily with a meal.   omeprazole 20 MG capsule Commonly known as:  PRILOSEC Take 20 mg by mouth every evening.   oxyCODONE-acetaminophen 10-325 MG tablet Commonly known as:  PERCOCET Take 1 tablet by mouth every 4 (four) hours as needed for pain.   pioglitazone 30 MG tablet Commonly known as:  ACTOS Take 1 tablet (30 mg total) by mouth daily.   promethazine 25 MG tablet Commonly known as:  PHENERGAN Take 25 mg by mouth 3 (three) times daily as needed for nausea or vomiting.   ranitidine 150 MG tablet Commonly known as:  ZANTAC Take 150 mg by mouth daily.   saxagliptin HCl 5 MG Tabs tablet Commonly known as:  ONGLYZA Take 0.5 tablets (2.5 mg total) by mouth daily. What changed:  how much to take   SENNA S 8.6-50 MG tablet Generic  drug:  senna-docusate Take 1 tablet by mouth daily as needed for mild constipation.   simvastatin 20 MG tablet Commonly known as:  ZOCOR Take 20 mg by mouth at bedtime.   tamsulosin 0.4 MG Caps capsule Commonly known as:  FLOMAX Take 1 capsule (0.4 mg total) by mouth daily after supper.      Allergies  Allergen Reactions  . Brilinta [Ticagrelor] Shortness Of Breath and Other (See Comments)    Changed to Plavix due to SOB  . Clindamycin/Lincomycin Anaphylaxis    Whole body skin peeling, blisters  . Doxycycline Anaphylaxis  . Penicillins Rash and Other (See Comments)    Has patient had a PCN reaction causing immediate rash, facial/tongue/throat swelling, SOB or lightheadedness with hypotension:Yes Has patient had a PCN reaction causing severe rash involving mucus membranes or skin necrosis: No Has patient had a PCN reaction that required hospitalization: No Has patient had a PCN reaction occurring within the last 10 years: No If all of the above answers are "NO", then may proceed with Cephalosporin use.  . Sulfa Antibiotics Other (See Comments)    Pt does not remember the reaction, but it sure the allergy exists.      The results of significant diagnostics from this hospitalization (including imaging, microbiology, ancillary and laboratory) are listed below for reference.    Significant Diagnostic Studies: Dg Chest 2 View  Result Date: 12/08/2017 CLINICAL DATA:  Altered mental status.  Productive cough. EXAM: CHEST  2 VIEW COMPARISON:  01/04/2017 FINDINGS: The patient is hunched over to the left in a semi erect projection. This results in asymmetric distribution of soft tissues which is believed to at least partially explain why the left hemithorax appears more dense than the right. Upper zone pulmonary vascular prominence with mild enlargement of the cardiopericardial silhouette suggesting pulmonary venous hypertension. Kerley B lines are present suggesting mild interstitial  edema. There is blunting of both posterior costophrenic angles suggesting small bilateral pleural effusions. Lower thoracic kyphosis noted, stable. IMPRESSION: 1. Kerley B-lines and pulmonary venous hypertension with mild cardiomegaly. The appearance is compatible with mild interstitial edema. 2. The most of the hemithoracic asymmetry is due to the distribution of soft tissues of the chest wall given that the patient is hunched over to the left. 3.  Aortic Atherosclerosis (ICD10-I70.0). 4. Small bilateral pleural effusions. Electronically Signed   By: Van Clines M.D.   On: 12/08/2017 16:56   Xr Ankle Complete Left  Result Date: 11/30/2017 3 view radiographs of the left ankle shows some callus formation however there has been loss of reduction due to osteoporosis and peripheral vascular disease.  Xr  Ankle Complete Left  Result Date: 11/23/2017 Radiographs of left ankle show some backing out of lateral screws. There is some interval callus formation along fibular fracture. The tibia screws are intact with no failure.   Microbiology: Recent Results (from the past 240 hour(s))  MRSA PCR Screening     Status: Abnormal   Collection Time: 12/05/17 10:14 PM  Result Value Ref Range Status   MRSA by PCR POSITIVE (A) NEGATIVE Final    Comment:        The GeneXpert MRSA Assay (FDA approved for NASAL specimens only), is one component of a comprehensive MRSA colonization surveillance program. It is not intended to diagnose MRSA infection nor to guide or monitor treatment for MRSA infections. RESULT CALLED TO, READ BACK BY AND VERIFIED WITHBelenda Cruise RN 12/06/17 0319 JDW Performed at [facility], [address] [place], [place] [address]   Culture, blood (routine x 2)     Status: None   Collection Time: 12/08/17 12:38 PM  Result Value Ref Range Status   Specimen Description BLOOD LEFT HAND  Final   Special Requests IN PEDIATRIC BOTTLE Blood Culture adequate volume  Final   Culture    Final    NO GROWTH 5 DAYS Performed at [facility], [place] [address] [place], [place] [address]    Report Status 12/13/2017 FINAL  Final  Culture, blood (routine x 2)     Status: None   Collection Time: 12/08/17 12:39 PM  Result Value Ref Range Status   Specimen Description BLOOD LEFT WRIST  Final   Special Requests IN PEDIATRIC BOTTLE Blood Culture adequate volume  Final   Culture   Final    NO GROWTH 5 DAYS Performed at [facility], [place] [address], [place], [place] [address]    Report Status 12/13/2017 FINAL  Final  Culture, Urine     Status: Abnormal   Collection Time: 12/08/17  4:00 PM  Result Value Ref Range Status   Specimen Description URINE, CLEAN CATCH  Final   Special Requests   Final    NONE Performed at [facility], [place] [address], [place], [place] [address]    Culture MULTIPLE SPECIES PRESENT, SUGGEST RECOLLECTION (A)  Final   Report Status 12/09/2017 FINAL  Final     Labs: Basic Metabolic Panel: Recent Labs  Lab 12/09/17 0238 12/10/17 0242 12/11/17 0735 12/12/17 0219 12/13/17 0306  NA 138 139 136 136 134*  K 4.7 4.7 4.5 4.7 4.5  CL 112* 110 106 108 108  CO2 19* 18* 22 21* 20*  GLUCOSE 100* 94 148* 128* 146*  BUN 59* 62* 63* 71* 65*  CREATININE 1.62* 1.85* 1.96* 2.21* 2.11*  CALCIUM 8.1* 7.9* 7.4* 7.2* 7.3*   Liver Function Tests: No results for input(s): AST, ALT, ALKPHOS, BILITOT, PROT, ALBUMIN in the last 168 hours. No results for input(s): LIPASE, AMYLASE in the last 168 hours. Recent Labs  Lab 12/10/17 0926  AMMONIA 32   CBC: Recent Labs  Lab 12/09/17 1222 12/10/17 0242 12/10/17 1102 12/11/17 0735 12/12/17 0219 12/13/17 0306  WBC  --  29.8* 24.4* 23.1* 16.3* 21.6*  NEUTROABS 12.8*  --  18.6* 17.4*  --   --   HGB  --  11.4* 11.7* 10.8* 9.5* 9.9*  HCT  --  35.7* 36.8* 33.7* 29.3* 30.1*  MCV  --  94.4 96.3 94.4 93.0 94.4  PLT  --  252 285 204 181 174   Cardiac  Enzymes: No results for input(s): CKTOTAL, CKMB,  CKMBINDEX, TROPONINI in the last 168 hours. BNP: BNP (last 3 results) No results for input(s): BNP in the last 8760 hours.  ProBNP (last 3 results) No results for input(s): PROBNP in the last 8760 hours.  CBG: Recent Labs  Lab 12/12/17 1107 12/12/17 1639 12/12/17 2121 12/13/17 0613 12/13/17 1208  GLUCAP 166* 169* 161* 140* 165*       Signed:  Velvet Bathe MD.  [facility] 12/13/2017, 3:59 PM

## 2017-12-14 ENCOUNTER — Inpatient Hospital Stay (INDEPENDENT_AMBULATORY_CARE_PROVIDER_SITE_OTHER): Payer: Medicare Other | Admitting: Orthopedic Surgery

## 2017-12-14 ENCOUNTER — Ambulatory Visit (INDEPENDENT_AMBULATORY_CARE_PROVIDER_SITE_OTHER): Payer: Medicare Other | Admitting: Orthopedic Surgery

## 2017-12-15 ENCOUNTER — Telehealth (INDEPENDENT_AMBULATORY_CARE_PROVIDER_SITE_OTHER): Payer: Self-pay | Admitting: Orthopedic Surgery

## 2017-12-15 NOTE — Telephone Encounter (Signed)
Noah from [facility] called requesting order for incision care.  CB#[phone].  Thank you.

## 2017-12-17 DIAGNOSIS — I480 Paroxysmal atrial fibrillation: Secondary | ICD-10-CM | POA: Diagnosis not present

## 2017-12-17 DIAGNOSIS — D6489 Other specified anemias: Secondary | ICD-10-CM | POA: Diagnosis not present

## 2017-12-17 DIAGNOSIS — S82852D Displaced trimalleolar fracture of left lower leg, subsequent encounter for closed fracture with routine healing: Secondary | ICD-10-CM | POA: Diagnosis not present

## 2017-12-17 DIAGNOSIS — I2510 Atherosclerotic heart disease of native coronary artery without angina pectoris: Secondary | ICD-10-CM | POA: Diagnosis not present

## 2017-12-17 DIAGNOSIS — E119 Type 2 diabetes mellitus without complications: Secondary | ICD-10-CM | POA: Diagnosis not present

## 2017-12-18 NOTE — Telephone Encounter (Signed)
Pt is s/p a hardware removal left ankle on 12/01/17. Pt has an appt on Thursday. Advise for dry dressing with ace bandage for compression and will update orders after visit this week.  sw Kelly at [facility]

## 2017-12-19 DIAGNOSIS — L988 Other specified disorders of the skin and subcutaneous tissue: Secondary | ICD-10-CM | POA: Diagnosis not present

## 2017-12-21 ENCOUNTER — Ambulatory Visit (INDEPENDENT_AMBULATORY_CARE_PROVIDER_SITE_OTHER): Payer: Medicare Other | Admitting: Orthopedic Surgery

## 2017-12-21 ENCOUNTER — Encounter (INDEPENDENT_AMBULATORY_CARE_PROVIDER_SITE_OTHER): Payer: Self-pay | Admitting: Orthopedic Surgery

## 2017-12-21 VITALS — Ht 68.0 in | Wt 225.0 lb

## 2017-12-21 DIAGNOSIS — T847XXA Infection and inflammatory reaction due to other internal orthopedic prosthetic devices, implants and grafts, initial encounter: Secondary | ICD-10-CM

## 2017-12-21 NOTE — Progress Notes (Signed)
Office Visit Note   Patient: [name] [name]           Date of Birth: 1933-09-24           MRN: [id number] Visit Date: 12/21/2017              Requested by: [name], MD [address] [place] [address] [place], [place] [address] PCP: [name], MD  Chief Complaint  Patient presents with  . Left Ankle - Routine Post Op    12/01/17 Removal of hardware left ankle.  . Neck - Pain      HPI: Patient presents in follow-up status post removal of hardware left ankle with infection and failed hardware.  Patient states his biggest problem now is with neck pain and a urinary tract infection.  Assessment & Plan: Visit Diagnoses:  1. Hardware complicating wound infection, initial encounter ([place])     Plan: Recommended using heat and prednisone 20 mg a day to help with his neck symptoms.  Recommended nonweightbearing on the left lower extremity where the fracture boot 24 hours a day change the dressing daily.  Recommended protein supplement for healing.  Examination patient's incisions are healing nicely the medial incision is completely healed laterally  Follow-Up Instructions: Return in about 2 weeks (around 01/04/2018).   Ortho Exam  Patient is alert, oriented, no adenopathy, well-dressed, normal affect, normal respiratory effort. There is a very small gaping of the wound approximately 1 x 5 mm with some clear drainage there is no cellulitis no signs of infection there is no pain with passive range of motion of the ankle.  Imaging: No results found. No images are attached to the encounter.  Labs: Lab Results  Component Value Date   HGBA1C 6.4 (H) 12/05/2017   HGBA1C 6.5 (H) 01/04/2017   HGBA1C 6.8 (H) 01/15/2016   REPTSTATUS 12/09/2017 FINAL 12/08/2017   CULT MULTIPLE SPECIES PRESENT, SUGGEST RECOLLECTION (A) 12/08/2017    @LABSALLVALUES (HGBA1)@  Body mass index is 34.21 kg/m.  Orders:  No orders of the defined types were placed in this encounter.  No orders of the  defined types were placed in this encounter.    Procedures: No procedures performed  Clinical Data: No additional findings.  ROS:  All other systems negative, except as noted in the HPI. Review of Systems  Objective: Vital Signs: Ht 5' 8"  (1.727 m)   Wt 225 lb (102.1 kg)   BMI 34.21 kg/m   Specialty Comments:  No specialty comments available.  PMFS History: Patient Active Problem List   Diagnosis Date Noted  . AKI (acute kidney injury) ([place])   . Anaphylactic syndrome   . Drug-induced skin rash   . Acute metabolic encephalopathy 19/37/9024  . Allergic reaction caused by a drug 12/05/2017  . Acute renal failure superimposed on stage 3 chronic kidney disease ([place]) 12/05/2017  . Metabolic acidosis 09/73/5329  . Hypotension 12/05/2017  . Hardware complicating wound infection ([place]) 11/30/2017  . Trimalleolar fracture of ankle, closed, left, initial encounter   . Fall 10/24/2017  . Malleolar fracture, left, closed, initial encounter 10/24/2017  . Fibula fracture 10/24/2017  . Diabetes mellitus with diabetic nephropathy without long-term current use of insulin ([place]) 01/05/2017  . CKD (chronic kidney disease), stage III ([place])   . Chronic combined systolic and diastolic CHF (congestive heart failure) ([name])   . Paroxysmal atrial fibrillation ([name])   . Bleeding gastrointestinal   . Dyslipidemia 10/14/2014  . CAD in native artery- total LAD, s/p RI DES 06/19/14  07/11/2014  . Ulcerative colitis ([place])    Past Medical History:  Diagnosis Date  . Allergic reaction caused by a drug 11/2017  . Anemia   . Atrial flutter ([place])    a. s/p RFCA of counterclockwise cavo-tricuspid isthmus dependent atrial flutter 2009 by Dr. Rayann Heman.  Marland Kitchen CAD (coronary artery disease) 06/2014   a. 06/2014: abnl nuc. Cath: Totally occluded LAD with faint collaterals (not a candidate for CTO PCI), 90% ramus s/p DES, 50-70% OM2.  . Chronic diastolic CHF (congestive heart failure) ([place])    a. 2D Echo 10/23/13: EF  50-55%, basal mid-inf HK, grade 2 DD, mild MR, mod dilated LA, no sig change from prior.  . CKD (chronic kidney disease), stage III ([place])   . Colostomy in place [facility])   . Complication of anesthesia    " during my kidney stone surgery my heart went out of rhythm  . Compression fracture of L1 lumbar vertebra ([name])   . Diabetes ([place])    TYPE 2   . Dyslipidemia   . Dyspnea   . Frequent PVCs   . GERD (gastroesophageal reflux disease)   . History of blood transfusion   . History of kidney stones   . History of peptic ulcer disease   . HTN (hypertension)   . Hx of acute renal failure 01/02/10-3/24-11   due to hypovolemic shock, gastroenteritis and dehydration,hospitalized . Did requre a few days of dialysis. Cr at discharge was 1.8  . Kidney disease   . Kidney stone may 2009   Right hydronephrosis, S/P stone removal  . Lower back pain   . Macular degeneration   . OA (osteoarthritis) of hip   . Obesity   . Osteopenia   . PAF (paroxysmal atrial fibrillation) ([name])    not on long term anticoagulation due to history of heme positive stools and anemia  . Shingles    episode  . Small bowel obstruction, partial ([place]) 2009   Episode  . Ulcerative colitis ([place])    a. s/p total colectomy remotely.    Family History  Problem Relation Age of Onset  . Colon cancer Mother     Past Surgical History:  Procedure Laterality Date  . BACK SURGERY    . CARDIAC CATHETERIZATION  06/19/2014  . COLON RESECTION    . COLOSTOMY    . CORONARY ANGIOPLASTY    . CORONARY STENT PLACEMENT  06/19/2014   DES       dr Martinique  . HARDWARE REMOVAL Left 12/01/2017   Procedure: HARDWARE REMOVAL LEFT ANKLE;  Surgeon: Newt Minion, MD;  Location: [facility];  Service: Orthopedics;  Laterality: Left;  . HERNIA REPAIR    . LEFT AND RIGHT HEART CATHETERIZATION WITH CORONARY ANGIOGRAM N/A 06/19/2014   Procedure: LEFT AND RIGHT HEART CATHETERIZATION WITH CORONARY ANGIOGRAM;  Surgeon: Peter M Martinique, MD;  Location: [facility] CATH  LAB;  Service: Cardiovascular;  Laterality: N/A;  . ORIF ANKLE FRACTURE Left 10/25/2017   Procedure: OPEN REDUCTION INTERNAL FIXATION (ORIF) LEFT ANKLE FRACTURE;  Surgeon: Newt Minion, MD;  Location: [facility];  Service: Orthopedics;  Laterality: Left;  . TONSILLECTOMY     Social History   Occupational History  . Not on file  Tobacco Use  . Smoking status: Never Smoker  . Smokeless tobacco: Never Used  Substance and Sexual Activity  . Alcohol use: No  . Drug use: No  . Sexual activity: Not on file

## 2017-12-24 DIAGNOSIS — S8292XA Unspecified fracture of left lower leg, initial encounter for closed fracture: Secondary | ICD-10-CM | POA: Diagnosis not present

## 2017-12-24 DIAGNOSIS — I10 Essential (primary) hypertension: Secondary | ICD-10-CM | POA: Diagnosis not present

## 2017-12-24 DIAGNOSIS — R410 Disorientation, unspecified: Secondary | ICD-10-CM | POA: Diagnosis not present

## 2017-12-26 DIAGNOSIS — S31000A Unspecified open wound of lower back and pelvis without penetration into retroperitoneum, initial encounter: Secondary | ICD-10-CM | POA: Diagnosis not present

## 2017-12-26 DIAGNOSIS — L988 Other specified disorders of the skin and subcutaneous tissue: Secondary | ICD-10-CM | POA: Diagnosis not present

## 2017-12-26 DIAGNOSIS — L89322 Pressure ulcer of left buttock, stage 2: Secondary | ICD-10-CM | POA: Diagnosis not present

## 2017-12-27 DIAGNOSIS — S82852D Displaced trimalleolar fracture of left lower leg, subsequent encounter for closed fracture with routine healing: Secondary | ICD-10-CM | POA: Diagnosis not present

## 2017-12-27 DIAGNOSIS — I5043 Acute on chronic combined systolic (congestive) and diastolic (congestive) heart failure: Secondary | ICD-10-CM | POA: Diagnosis not present

## 2017-12-27 DIAGNOSIS — R2681 Unsteadiness on feet: Secondary | ICD-10-CM | POA: Diagnosis not present

## 2017-12-27 DIAGNOSIS — R278 Other lack of coordination: Secondary | ICD-10-CM | POA: Diagnosis not present

## 2017-12-27 DIAGNOSIS — M6389 Disorders of muscle in diseases classified elsewhere, multiple sites: Secondary | ICD-10-CM | POA: Diagnosis not present

## 2017-12-27 DIAGNOSIS — M6281 Muscle weakness (generalized): Secondary | ICD-10-CM | POA: Diagnosis not present

## 2017-12-28 DIAGNOSIS — R2681 Unsteadiness on feet: Secondary | ICD-10-CM | POA: Diagnosis not present

## 2017-12-28 DIAGNOSIS — S82852D Displaced trimalleolar fracture of left lower leg, subsequent encounter for closed fracture with routine healing: Secondary | ICD-10-CM | POA: Diagnosis not present

## 2017-12-28 DIAGNOSIS — I5043 Acute on chronic combined systolic (congestive) and diastolic (congestive) heart failure: Secondary | ICD-10-CM | POA: Diagnosis not present

## 2017-12-28 DIAGNOSIS — R278 Other lack of coordination: Secondary | ICD-10-CM | POA: Diagnosis not present

## 2017-12-28 DIAGNOSIS — M6389 Disorders of muscle in diseases classified elsewhere, multiple sites: Secondary | ICD-10-CM | POA: Diagnosis not present

## 2017-12-28 DIAGNOSIS — M6281 Muscle weakness (generalized): Secondary | ICD-10-CM | POA: Diagnosis not present

## 2017-12-29 DIAGNOSIS — I5043 Acute on chronic combined systolic (congestive) and diastolic (congestive) heart failure: Secondary | ICD-10-CM | POA: Diagnosis not present

## 2017-12-29 DIAGNOSIS — M6389 Disorders of muscle in diseases classified elsewhere, multiple sites: Secondary | ICD-10-CM | POA: Diagnosis not present

## 2017-12-29 DIAGNOSIS — R2681 Unsteadiness on feet: Secondary | ICD-10-CM | POA: Diagnosis not present

## 2017-12-29 DIAGNOSIS — S82852D Displaced trimalleolar fracture of left lower leg, subsequent encounter for closed fracture with routine healing: Secondary | ICD-10-CM | POA: Diagnosis not present

## 2017-12-29 DIAGNOSIS — M6281 Muscle weakness (generalized): Secondary | ICD-10-CM | POA: Diagnosis not present

## 2017-12-29 DIAGNOSIS — R278 Other lack of coordination: Secondary | ICD-10-CM | POA: Diagnosis not present

## 2018-01-01 DIAGNOSIS — R278 Other lack of coordination: Secondary | ICD-10-CM | POA: Diagnosis not present

## 2018-01-01 DIAGNOSIS — M6281 Muscle weakness (generalized): Secondary | ICD-10-CM | POA: Diagnosis not present

## 2018-01-01 DIAGNOSIS — R2681 Unsteadiness on feet: Secondary | ICD-10-CM | POA: Diagnosis not present

## 2018-01-01 DIAGNOSIS — M6389 Disorders of muscle in diseases classified elsewhere, multiple sites: Secondary | ICD-10-CM | POA: Diagnosis not present

## 2018-01-01 DIAGNOSIS — I5043 Acute on chronic combined systolic (congestive) and diastolic (congestive) heart failure: Secondary | ICD-10-CM | POA: Diagnosis not present

## 2018-01-01 DIAGNOSIS — S82852D Displaced trimalleolar fracture of left lower leg, subsequent encounter for closed fracture with routine healing: Secondary | ICD-10-CM | POA: Diagnosis not present

## 2018-01-02 DIAGNOSIS — L988 Other specified disorders of the skin and subcutaneous tissue: Secondary | ICD-10-CM | POA: Diagnosis not present

## 2018-01-02 DIAGNOSIS — R278 Other lack of coordination: Secondary | ICD-10-CM | POA: Diagnosis not present

## 2018-01-02 DIAGNOSIS — M6281 Muscle weakness (generalized): Secondary | ICD-10-CM | POA: Diagnosis not present

## 2018-01-02 DIAGNOSIS — M6389 Disorders of muscle in diseases classified elsewhere, multiple sites: Secondary | ICD-10-CM | POA: Diagnosis not present

## 2018-01-02 DIAGNOSIS — L89322 Pressure ulcer of left buttock, stage 2: Secondary | ICD-10-CM | POA: Diagnosis not present

## 2018-01-02 DIAGNOSIS — S31000A Unspecified open wound of lower back and pelvis without penetration into retroperitoneum, initial encounter: Secondary | ICD-10-CM | POA: Diagnosis not present

## 2018-01-02 DIAGNOSIS — I5043 Acute on chronic combined systolic (congestive) and diastolic (congestive) heart failure: Secondary | ICD-10-CM | POA: Diagnosis not present

## 2018-01-02 DIAGNOSIS — S82852D Displaced trimalleolar fracture of left lower leg, subsequent encounter for closed fracture with routine healing: Secondary | ICD-10-CM | POA: Diagnosis not present

## 2018-01-02 DIAGNOSIS — R2681 Unsteadiness on feet: Secondary | ICD-10-CM | POA: Diagnosis not present

## 2018-01-03 DIAGNOSIS — S82852D Displaced trimalleolar fracture of left lower leg, subsequent encounter for closed fracture with routine healing: Secondary | ICD-10-CM | POA: Diagnosis not present

## 2018-01-03 DIAGNOSIS — M6281 Muscle weakness (generalized): Secondary | ICD-10-CM | POA: Diagnosis not present

## 2018-01-03 DIAGNOSIS — I5043 Acute on chronic combined systolic (congestive) and diastolic (congestive) heart failure: Secondary | ICD-10-CM | POA: Diagnosis not present

## 2018-01-03 DIAGNOSIS — M6389 Disorders of muscle in diseases classified elsewhere, multiple sites: Secondary | ICD-10-CM | POA: Diagnosis not present

## 2018-01-03 DIAGNOSIS — R2681 Unsteadiness on feet: Secondary | ICD-10-CM | POA: Diagnosis not present

## 2018-01-03 DIAGNOSIS — R278 Other lack of coordination: Secondary | ICD-10-CM | POA: Diagnosis not present

## 2018-01-04 ENCOUNTER — Ambulatory Visit (INDEPENDENT_AMBULATORY_CARE_PROVIDER_SITE_OTHER): Payer: Medicare Other

## 2018-01-04 ENCOUNTER — Encounter (INDEPENDENT_AMBULATORY_CARE_PROVIDER_SITE_OTHER): Payer: Self-pay | Admitting: Orthopedic Surgery

## 2018-01-04 ENCOUNTER — Ambulatory Visit (INDEPENDENT_AMBULATORY_CARE_PROVIDER_SITE_OTHER): Payer: Medicare Other | Admitting: Orthopedic Surgery

## 2018-01-04 VITALS — Ht 68.0 in | Wt 225.0 lb

## 2018-01-04 DIAGNOSIS — S82852D Displaced trimalleolar fracture of left lower leg, subsequent encounter for closed fracture with routine healing: Secondary | ICD-10-CM | POA: Diagnosis not present

## 2018-01-04 DIAGNOSIS — M25572 Pain in left ankle and joints of left foot: Secondary | ICD-10-CM

## 2018-01-04 DIAGNOSIS — M6389 Disorders of muscle in diseases classified elsewhere, multiple sites: Secondary | ICD-10-CM | POA: Diagnosis not present

## 2018-01-04 DIAGNOSIS — R2681 Unsteadiness on feet: Secondary | ICD-10-CM | POA: Diagnosis not present

## 2018-01-04 DIAGNOSIS — R278 Other lack of coordination: Secondary | ICD-10-CM | POA: Diagnosis not present

## 2018-01-04 DIAGNOSIS — M6281 Muscle weakness (generalized): Secondary | ICD-10-CM | POA: Diagnosis not present

## 2018-01-04 DIAGNOSIS — I10 Essential (primary) hypertension: Secondary | ICD-10-CM | POA: Diagnosis not present

## 2018-01-04 DIAGNOSIS — T847XXA Infection and inflammatory reaction due to other internal orthopedic prosthetic devices, implants and grafts, initial encounter: Secondary | ICD-10-CM

## 2018-01-04 DIAGNOSIS — I5043 Acute on chronic combined systolic (congestive) and diastolic (congestive) heart failure: Secondary | ICD-10-CM | POA: Diagnosis not present

## 2018-01-04 NOTE — Progress Notes (Signed)
Office Visit Note   Patient: [name] [name]           Date of Birth: 30-Sep-1933           MRN: [id number] Visit Date: 01/04/2018              Requested by: [name], MD [address] [place],  [id number] PCP: [name], MD  Chief Complaint  Patient presents with  . Left Ankle - Routine Post Op    2/15/019 left ankle hardware removal      HPI: Patient is an 84 year old gentleman with severe peripheral vascular disease who had complications from the internal fixation of the left ankle.  The hardware was removed patient presents in follow-up.  Assessment & Plan: Visit Diagnoses:  1. Pain in left ankle and joints of left foot   2. Hardware complicating wound infection, initial encounter [facility])     Plan: Patient has no drainage or cellulitis.  We will harvest the sutures he will continue with the fracture boot nonweightbearing on the left.  Patient was given instructions for strengthening of the upper and lower extremities with nonweightbearing on the left where the fracture boot 24 hours a day.  Orders were written for prednisone 10 mg with breakfast to be taken daily for 3 weeks to help with his neck degenerative disc disease.  Patient calf measures 40 cm in circumference is recommended that he resume wearing his 15-20 mm medical compression stockings.  If these are a size smaller than extra-large he should wear an Ace wrap and 4 x 4 gauze.  Follow-Up Instructions: Return in about 3 weeks (around 01/25/2018).   Ortho Exam  Patient is alert, oriented, no adenopathy, well-dressed, normal affect, normal respiratory effort. Examination the wound edges are well approximated there is no wound dehiscence no cellulitis no drainage no signs of infection.  Patient's ankle is straight there is no angular deformity.  Imaging: Xr Ankle Complete Left  Result Date: 01/04/2018 3 view radiographs of the left ankle shows stable alignment of the medial and lateral  malleolar fragments.  The mortise is congruent.  Patient has severe peripheral vascular disease with calcification of the dorsalis pedis and posterior tibial arteries.  No images are attached to the encounter.  Labs: Lab Results  Component Value Date   HGBA1C 6.4 (H) 12/05/2017   HGBA1C 6.5 (H) 01/04/2017   HGBA1C 6.8 (H) 01/15/2016   REPTSTATUS 12/09/2017 FINAL 12/08/2017   CULT MULTIPLE SPECIES PRESENT, SUGGEST RECOLLECTION (A) 12/08/2017    @LABSALLVALUES (HGBA1)@  Body mass index is 34.21 kg/m.  Orders:  Orders Placed This Encounter  Procedures  . XR Ankle Complete Left   No orders of the defined types were placed in this encounter.    Procedures: No procedures performed  Clinical Data: No additional findings.  ROS:  All other systems negative, except as noted in the HPI. Review of Systems  Objective: Vital Signs: Ht 5' 8"  (1.727 m)   Wt 225 lb (102.1 kg)   BMI 34.21 kg/m   Specialty Comments:  No specialty comments available.  PMFS History: Patient Active Problem List   Diagnosis Date Noted  . AKI (acute kidney injury) ([place])   . Anaphylactic syndrome   . Drug-induced skin rash   . Acute metabolic encephalopathy 29/47/6546  . Allergic reaction caused by a drug 12/05/2017  . Acute renal failure superimposed on stage 3 chronic kidney disease ([place]) 12/05/2017  . Metabolic acidosis 50/35/4656  . Hypotension 12/05/2017  .  Hardware complicating wound infection ([place]) 11/30/2017  . Trimalleolar fracture of ankle, closed, left, initial encounter   . Fall 10/24/2017  . Malleolar fracture, left, closed, initial encounter 10/24/2017  . Fibula fracture 10/24/2017  . Diabetes mellitus with diabetic nephropathy without long-term current use of insulin ([place]) 01/05/2017  . CKD (chronic kidney disease), stage III ([place])   . Chronic combined systolic and diastolic CHF (congestive heart failure) ([place])   . Paroxysmal atrial fibrillation ([name])   . Bleeding  gastrointestinal   . Dyslipidemia 10/14/2014  . CAD in native artery- total LAD, s/p RI DES 06/19/14 07/11/2014  . Ulcerative colitis ([place])    Past Medical History:  Diagnosis Date  . Allergic reaction caused by a drug 11/2017  . Anemia   . Atrial flutter ([place])    a. s/p RFCA of counterclockwise cavo-tricuspid isthmus dependent atrial flutter 2009 by Dr. Rayann Heman.  Marland Kitchen CAD (coronary artery disease) 06/2014   a. 06/2014: abnl nuc. Cath: Totally occluded LAD with faint collaterals (not a candidate for CTO PCI), 90% ramus s/p DES, 50-70% OM2.  . Chronic diastolic CHF (congestive heart failure) ([place])    a. 2D Echo 10/23/13: EF 50-55%, basal mid-inf HK, grade 2 DD, mild MR, mod dilated LA, no sig change from prior.  . CKD (chronic kidney disease), stage III ([place])   . Colostomy in place [facility])   . Complication of anesthesia    " during my kidney stone surgery my heart went out of rhythm  . Compression fracture of L1 lumbar vertebra ([name])   . Diabetes ([place])    TYPE 2   . Dyslipidemia   . Dyspnea   . Frequent PVCs   . GERD (gastroesophageal reflux disease)   . History of blood transfusion   . History of kidney stones   . History of peptic ulcer disease   . HTN (hypertension)   . Hx of acute renal failure 01/02/10-3/24-11   due to hypovolemic shock, gastroenteritis and dehydration,hospitalized . Did requre a few days of dialysis. Cr at discharge was 1.8  . Kidney disease   . Kidney stone may 2009   Right hydronephrosis, S/P stone removal  . Lower back pain   . Macular degeneration   . OA (osteoarthritis) of hip   . Obesity   . Osteopenia   . PAF (paroxysmal atrial fibrillation) ([name])    not on long term anticoagulation due to history of heme positive stools and anemia  . Shingles    episode  . Small bowel obstruction, partial ([place]) 2009   Episode  . Ulcerative colitis ([place])    a. s/p total colectomy remotely.    Family History  Problem Relation Age of Onset  . Colon cancer Mother       Past Surgical History:  Procedure Laterality Date  . BACK SURGERY    . CARDIAC CATHETERIZATION  06/19/2014  . COLON RESECTION    . COLOSTOMY    . CORONARY ANGIOPLASTY    . CORONARY STENT PLACEMENT  06/19/2014   DES       dr Martinique  . HARDWARE REMOVAL Left 12/01/2017   Procedure: HARDWARE REMOVAL LEFT ANKLE;  Surgeon: Newt Minion, MD;  Location: [facility];  Service: Orthopedics;  Laterality: Left;  . HERNIA REPAIR    . LEFT AND RIGHT HEART CATHETERIZATION WITH CORONARY ANGIOGRAM N/A 06/19/2014   Procedure: LEFT AND RIGHT HEART CATHETERIZATION WITH CORONARY ANGIOGRAM;  Surgeon: Peter M Martinique, MD;  Location: [facility] CATH LAB;  Service: Cardiovascular;  Laterality: N/A;  . ORIF ANKLE FRACTURE Left 10/25/2017   Procedure: OPEN REDUCTION INTERNAL FIXATION (ORIF) LEFT ANKLE FRACTURE;  Surgeon: Newt Minion, MD;  Location: [facility];  Service: Orthopedics;  Laterality: Left;  . TONSILLECTOMY     Social History   Occupational History  . Not on file  Tobacco Use  . Smoking status: Never Smoker  . Smokeless tobacco: Never Used  Substance and Sexual Activity  . Alcohol use: No  . Drug use: No  . Sexual activity: Not on file

## 2018-01-05 DIAGNOSIS — S82852D Displaced trimalleolar fracture of left lower leg, subsequent encounter for closed fracture with routine healing: Secondary | ICD-10-CM | POA: Diagnosis not present

## 2018-01-05 DIAGNOSIS — R278 Other lack of coordination: Secondary | ICD-10-CM | POA: Diagnosis not present

## 2018-01-05 DIAGNOSIS — R2681 Unsteadiness on feet: Secondary | ICD-10-CM | POA: Diagnosis not present

## 2018-01-05 DIAGNOSIS — I5043 Acute on chronic combined systolic (congestive) and diastolic (congestive) heart failure: Secondary | ICD-10-CM | POA: Diagnosis not present

## 2018-01-05 DIAGNOSIS — M6389 Disorders of muscle in diseases classified elsewhere, multiple sites: Secondary | ICD-10-CM | POA: Diagnosis not present

## 2018-01-05 DIAGNOSIS — M6281 Muscle weakness (generalized): Secondary | ICD-10-CM | POA: Diagnosis not present

## 2018-01-08 DIAGNOSIS — M6389 Disorders of muscle in diseases classified elsewhere, multiple sites: Secondary | ICD-10-CM | POA: Diagnosis not present

## 2018-01-08 DIAGNOSIS — R310 Gross hematuria: Secondary | ICD-10-CM | POA: Diagnosis not present

## 2018-01-08 DIAGNOSIS — R278 Other lack of coordination: Secondary | ICD-10-CM | POA: Diagnosis not present

## 2018-01-08 DIAGNOSIS — S82852D Displaced trimalleolar fracture of left lower leg, subsequent encounter for closed fracture with routine healing: Secondary | ICD-10-CM | POA: Diagnosis not present

## 2018-01-08 DIAGNOSIS — M6281 Muscle weakness (generalized): Secondary | ICD-10-CM | POA: Diagnosis not present

## 2018-01-08 DIAGNOSIS — R2681 Unsteadiness on feet: Secondary | ICD-10-CM | POA: Diagnosis not present

## 2018-01-08 DIAGNOSIS — I5043 Acute on chronic combined systolic (congestive) and diastolic (congestive) heart failure: Secondary | ICD-10-CM | POA: Diagnosis not present

## 2018-01-09 DIAGNOSIS — L89322 Pressure ulcer of left buttock, stage 2: Secondary | ICD-10-CM | POA: Diagnosis not present

## 2018-01-09 DIAGNOSIS — M6389 Disorders of muscle in diseases classified elsewhere, multiple sites: Secondary | ICD-10-CM | POA: Diagnosis not present

## 2018-01-09 DIAGNOSIS — I5043 Acute on chronic combined systolic (congestive) and diastolic (congestive) heart failure: Secondary | ICD-10-CM | POA: Diagnosis not present

## 2018-01-09 DIAGNOSIS — L988 Other specified disorders of the skin and subcutaneous tissue: Secondary | ICD-10-CM | POA: Diagnosis not present

## 2018-01-09 DIAGNOSIS — S82852D Displaced trimalleolar fracture of left lower leg, subsequent encounter for closed fracture with routine healing: Secondary | ICD-10-CM | POA: Diagnosis not present

## 2018-01-09 DIAGNOSIS — R278 Other lack of coordination: Secondary | ICD-10-CM | POA: Diagnosis not present

## 2018-01-09 DIAGNOSIS — M6281 Muscle weakness (generalized): Secondary | ICD-10-CM | POA: Diagnosis not present

## 2018-01-09 DIAGNOSIS — R2681 Unsteadiness on feet: Secondary | ICD-10-CM | POA: Diagnosis not present

## 2018-01-10 DIAGNOSIS — R278 Other lack of coordination: Secondary | ICD-10-CM | POA: Diagnosis not present

## 2018-01-10 DIAGNOSIS — S82852D Displaced trimalleolar fracture of left lower leg, subsequent encounter for closed fracture with routine healing: Secondary | ICD-10-CM | POA: Diagnosis not present

## 2018-01-10 DIAGNOSIS — M6389 Disorders of muscle in diseases classified elsewhere, multiple sites: Secondary | ICD-10-CM | POA: Diagnosis not present

## 2018-01-10 DIAGNOSIS — M6281 Muscle weakness (generalized): Secondary | ICD-10-CM | POA: Diagnosis not present

## 2018-01-10 DIAGNOSIS — I5043 Acute on chronic combined systolic (congestive) and diastolic (congestive) heart failure: Secondary | ICD-10-CM | POA: Diagnosis not present

## 2018-01-10 DIAGNOSIS — R2681 Unsteadiness on feet: Secondary | ICD-10-CM | POA: Diagnosis not present

## 2018-01-11 DIAGNOSIS — R278 Other lack of coordination: Secondary | ICD-10-CM | POA: Diagnosis not present

## 2018-01-11 DIAGNOSIS — S82852D Displaced trimalleolar fracture of left lower leg, subsequent encounter for closed fracture with routine healing: Secondary | ICD-10-CM | POA: Diagnosis not present

## 2018-01-11 DIAGNOSIS — M6281 Muscle weakness (generalized): Secondary | ICD-10-CM | POA: Diagnosis not present

## 2018-01-11 DIAGNOSIS — R2681 Unsteadiness on feet: Secondary | ICD-10-CM | POA: Diagnosis not present

## 2018-01-11 DIAGNOSIS — M6389 Disorders of muscle in diseases classified elsewhere, multiple sites: Secondary | ICD-10-CM | POA: Diagnosis not present

## 2018-01-11 DIAGNOSIS — I5043 Acute on chronic combined systolic (congestive) and diastolic (congestive) heart failure: Secondary | ICD-10-CM | POA: Diagnosis not present

## 2018-01-12 DIAGNOSIS — M6389 Disorders of muscle in diseases classified elsewhere, multiple sites: Secondary | ICD-10-CM | POA: Diagnosis not present

## 2018-01-12 DIAGNOSIS — S82852D Displaced trimalleolar fracture of left lower leg, subsequent encounter for closed fracture with routine healing: Secondary | ICD-10-CM | POA: Diagnosis not present

## 2018-01-12 DIAGNOSIS — R278 Other lack of coordination: Secondary | ICD-10-CM | POA: Diagnosis not present

## 2018-01-12 DIAGNOSIS — M6281 Muscle weakness (generalized): Secondary | ICD-10-CM | POA: Diagnosis not present

## 2018-01-12 DIAGNOSIS — R2681 Unsteadiness on feet: Secondary | ICD-10-CM | POA: Diagnosis not present

## 2018-01-12 DIAGNOSIS — I5043 Acute on chronic combined systolic (congestive) and diastolic (congestive) heart failure: Secondary | ICD-10-CM | POA: Diagnosis not present

## 2018-01-15 DIAGNOSIS — L89322 Pressure ulcer of left buttock, stage 2: Secondary | ICD-10-CM | POA: Diagnosis not present

## 2018-01-15 DIAGNOSIS — G629 Polyneuropathy, unspecified: Secondary | ICD-10-CM | POA: Diagnosis not present

## 2018-01-15 DIAGNOSIS — R2681 Unsteadiness on feet: Secondary | ICD-10-CM | POA: Diagnosis not present

## 2018-01-15 DIAGNOSIS — S82852D Displaced trimalleolar fracture of left lower leg, subsequent encounter for closed fracture with routine healing: Secondary | ICD-10-CM | POA: Diagnosis not present

## 2018-01-15 DIAGNOSIS — I5043 Acute on chronic combined systolic (congestive) and diastolic (congestive) heart failure: Secondary | ICD-10-CM | POA: Diagnosis not present

## 2018-01-15 DIAGNOSIS — L988 Other specified disorders of the skin and subcutaneous tissue: Secondary | ICD-10-CM | POA: Diagnosis not present

## 2018-01-15 DIAGNOSIS — M6389 Disorders of muscle in diseases classified elsewhere, multiple sites: Secondary | ICD-10-CM | POA: Diagnosis not present

## 2018-01-17 DIAGNOSIS — R2681 Unsteadiness on feet: Secondary | ICD-10-CM | POA: Diagnosis not present

## 2018-01-17 DIAGNOSIS — I5043 Acute on chronic combined systolic (congestive) and diastolic (congestive) heart failure: Secondary | ICD-10-CM | POA: Diagnosis not present

## 2018-01-17 DIAGNOSIS — M6389 Disorders of muscle in diseases classified elsewhere, multiple sites: Secondary | ICD-10-CM | POA: Diagnosis not present

## 2018-01-17 DIAGNOSIS — S82852D Displaced trimalleolar fracture of left lower leg, subsequent encounter for closed fracture with routine healing: Secondary | ICD-10-CM | POA: Diagnosis not present

## 2018-01-17 DIAGNOSIS — G629 Polyneuropathy, unspecified: Secondary | ICD-10-CM | POA: Diagnosis not present

## 2018-01-18 DIAGNOSIS — I5043 Acute on chronic combined systolic (congestive) and diastolic (congestive) heart failure: Secondary | ICD-10-CM | POA: Diagnosis not present

## 2018-01-18 DIAGNOSIS — G629 Polyneuropathy, unspecified: Secondary | ICD-10-CM | POA: Diagnosis not present

## 2018-01-18 DIAGNOSIS — S82852D Displaced trimalleolar fracture of left lower leg, subsequent encounter for closed fracture with routine healing: Secondary | ICD-10-CM | POA: Diagnosis not present

## 2018-01-18 DIAGNOSIS — R2681 Unsteadiness on feet: Secondary | ICD-10-CM | POA: Diagnosis not present

## 2018-01-18 DIAGNOSIS — M6389 Disorders of muscle in diseases classified elsewhere, multiple sites: Secondary | ICD-10-CM | POA: Diagnosis not present

## 2018-01-22 DIAGNOSIS — R310 Gross hematuria: Secondary | ICD-10-CM | POA: Diagnosis not present

## 2018-01-22 DIAGNOSIS — R319 Hematuria, unspecified: Secondary | ICD-10-CM | POA: Diagnosis not present

## 2018-01-22 DIAGNOSIS — R2681 Unsteadiness on feet: Secondary | ICD-10-CM | POA: Diagnosis not present

## 2018-01-22 DIAGNOSIS — M6389 Disorders of muscle in diseases classified elsewhere, multiple sites: Secondary | ICD-10-CM | POA: Diagnosis not present

## 2018-01-22 DIAGNOSIS — S82852D Displaced trimalleolar fracture of left lower leg, subsequent encounter for closed fracture with routine healing: Secondary | ICD-10-CM | POA: Diagnosis not present

## 2018-01-22 DIAGNOSIS — R100 Acute abdomen: Secondary | ICD-10-CM | POA: Diagnosis not present

## 2018-01-22 DIAGNOSIS — I5043 Acute on chronic combined systolic (congestive) and diastolic (congestive) heart failure: Secondary | ICD-10-CM | POA: Diagnosis not present

## 2018-01-22 DIAGNOSIS — G629 Polyneuropathy, unspecified: Secondary | ICD-10-CM | POA: Diagnosis not present

## 2018-01-23 DIAGNOSIS — M6389 Disorders of muscle in diseases classified elsewhere, multiple sites: Secondary | ICD-10-CM | POA: Diagnosis not present

## 2018-01-23 DIAGNOSIS — L89322 Pressure ulcer of left buttock, stage 2: Secondary | ICD-10-CM | POA: Diagnosis not present

## 2018-01-23 DIAGNOSIS — S82852D Displaced trimalleolar fracture of left lower leg, subsequent encounter for closed fracture with routine healing: Secondary | ICD-10-CM | POA: Diagnosis not present

## 2018-01-23 DIAGNOSIS — G629 Polyneuropathy, unspecified: Secondary | ICD-10-CM | POA: Diagnosis not present

## 2018-01-23 DIAGNOSIS — I5043 Acute on chronic combined systolic (congestive) and diastolic (congestive) heart failure: Secondary | ICD-10-CM | POA: Diagnosis not present

## 2018-01-23 DIAGNOSIS — R2681 Unsteadiness on feet: Secondary | ICD-10-CM | POA: Diagnosis not present

## 2018-01-23 DIAGNOSIS — L988 Other specified disorders of the skin and subcutaneous tissue: Secondary | ICD-10-CM | POA: Diagnosis not present

## 2018-01-25 ENCOUNTER — Encounter (INDEPENDENT_AMBULATORY_CARE_PROVIDER_SITE_OTHER): Payer: Self-pay | Admitting: Orthopedic Surgery

## 2018-01-25 ENCOUNTER — Ambulatory Visit (INDEPENDENT_AMBULATORY_CARE_PROVIDER_SITE_OTHER): Payer: Medicare Other | Admitting: Orthopedic Surgery

## 2018-01-25 DIAGNOSIS — I2582 Chronic total occlusion of coronary artery: Secondary | ICD-10-CM | POA: Diagnosis present

## 2018-01-25 DIAGNOSIS — I5030 Unspecified diastolic (congestive) heart failure: Secondary | ICD-10-CM | POA: Diagnosis not present

## 2018-01-25 DIAGNOSIS — E785 Hyperlipidemia, unspecified: Secondary | ICD-10-CM | POA: Diagnosis not present

## 2018-01-25 DIAGNOSIS — I959 Hypotension, unspecified: Secondary | ICD-10-CM | POA: Diagnosis present

## 2018-01-25 DIAGNOSIS — R5381 Other malaise: Secondary | ICD-10-CM | POA: Diagnosis not present

## 2018-01-25 DIAGNOSIS — Z933 Colostomy status: Secondary | ICD-10-CM | POA: Diagnosis not present

## 2018-01-25 DIAGNOSIS — I110 Hypertensive heart disease with heart failure: Secondary | ICD-10-CM | POA: Diagnosis not present

## 2018-01-25 DIAGNOSIS — I272 Pulmonary hypertension, unspecified: Secondary | ICD-10-CM | POA: Diagnosis present

## 2018-01-25 DIAGNOSIS — R2681 Unsteadiness on feet: Secondary | ICD-10-CM | POA: Diagnosis not present

## 2018-01-25 DIAGNOSIS — E875 Hyperkalemia: Secondary | ICD-10-CM | POA: Diagnosis not present

## 2018-01-25 DIAGNOSIS — Z9889 Other specified postprocedural states: Secondary | ICD-10-CM | POA: Diagnosis not present

## 2018-01-25 DIAGNOSIS — H3530 Unspecified macular degeneration: Secondary | ICD-10-CM | POA: Diagnosis present

## 2018-01-25 DIAGNOSIS — Z932 Ileostomy status: Secondary | ICD-10-CM | POA: Diagnosis not present

## 2018-01-25 DIAGNOSIS — R278 Other lack of coordination: Secondary | ICD-10-CM | POA: Diagnosis not present

## 2018-01-25 DIAGNOSIS — M8580 Other specified disorders of bone density and structure, unspecified site: Secondary | ICD-10-CM | POA: Diagnosis present

## 2018-01-25 DIAGNOSIS — L89152 Pressure ulcer of sacral region, stage 2: Secondary | ICD-10-CM | POA: Diagnosis present

## 2018-01-25 DIAGNOSIS — R748 Abnormal levels of other serum enzymes: Secondary | ICD-10-CM | POA: Diagnosis not present

## 2018-01-25 DIAGNOSIS — R0602 Shortness of breath: Secondary | ICD-10-CM | POA: Diagnosis not present

## 2018-01-25 DIAGNOSIS — I340 Nonrheumatic mitral (valve) insufficiency: Secondary | ICD-10-CM | POA: Diagnosis not present

## 2018-01-25 DIAGNOSIS — N183 Chronic kidney disease, stage 3 (moderate): Secondary | ICD-10-CM | POA: Diagnosis not present

## 2018-01-25 DIAGNOSIS — G4733 Obstructive sleep apnea (adult) (pediatric): Secondary | ICD-10-CM | POA: Diagnosis present

## 2018-01-25 DIAGNOSIS — R001 Bradycardia, unspecified: Secondary | ICD-10-CM | POA: Diagnosis not present

## 2018-01-25 DIAGNOSIS — I480 Paroxysmal atrial fibrillation: Secondary | ICD-10-CM | POA: Diagnosis not present

## 2018-01-25 DIAGNOSIS — G629 Polyneuropathy, unspecified: Secondary | ICD-10-CM | POA: Diagnosis not present

## 2018-01-25 DIAGNOSIS — I493 Ventricular premature depolarization: Secondary | ICD-10-CM | POA: Diagnosis present

## 2018-01-25 DIAGNOSIS — I2510 Atherosclerotic heart disease of native coronary artery without angina pectoris: Secondary | ICD-10-CM | POA: Diagnosis not present

## 2018-01-25 DIAGNOSIS — E669 Obesity, unspecified: Secondary | ICD-10-CM | POA: Diagnosis present

## 2018-01-25 DIAGNOSIS — Z4789 Encounter for other orthopedic aftercare: Secondary | ICD-10-CM | POA: Diagnosis not present

## 2018-01-25 DIAGNOSIS — Z66 Do not resuscitate: Secondary | ICD-10-CM | POA: Diagnosis present

## 2018-01-25 DIAGNOSIS — Z6833 Body mass index (BMI) 33.0-33.9, adult: Secondary | ICD-10-CM | POA: Diagnosis not present

## 2018-01-25 DIAGNOSIS — I10 Essential (primary) hypertension: Secondary | ICD-10-CM | POA: Diagnosis not present

## 2018-01-25 DIAGNOSIS — S82852G Displaced trimalleolar fracture of left lower leg, subsequent encounter for closed fracture with delayed healing: Secondary | ICD-10-CM

## 2018-01-25 DIAGNOSIS — M1610 Unilateral primary osteoarthritis, unspecified hip: Secondary | ICD-10-CM | POA: Diagnosis present

## 2018-01-25 DIAGNOSIS — R338 Other retention of urine: Secondary | ICD-10-CM | POA: Diagnosis not present

## 2018-01-25 DIAGNOSIS — S82852D Displaced trimalleolar fracture of left lower leg, subsequent encounter for closed fracture with routine healing: Secondary | ICD-10-CM | POA: Diagnosis not present

## 2018-01-25 DIAGNOSIS — N202 Calculus of kidney with calculus of ureter: Secondary | ICD-10-CM | POA: Diagnosis not present

## 2018-01-25 DIAGNOSIS — I130 Hypertensive heart and chronic kidney disease with heart failure and stage 1 through stage 4 chronic kidney disease, or unspecified chronic kidney disease: Secondary | ICD-10-CM | POA: Diagnosis present

## 2018-01-25 DIAGNOSIS — N3941 Urge incontinence: Secondary | ICD-10-CM | POA: Diagnosis not present

## 2018-01-25 DIAGNOSIS — D631 Anemia in chronic kidney disease: Secondary | ICD-10-CM | POA: Diagnosis present

## 2018-01-25 DIAGNOSIS — I5043 Acute on chronic combined systolic (congestive) and diastolic (congestive) heart failure: Secondary | ICD-10-CM | POA: Diagnosis not present

## 2018-01-25 DIAGNOSIS — I5033 Acute on chronic diastolic (congestive) heart failure: Secondary | ICD-10-CM | POA: Diagnosis not present

## 2018-01-25 DIAGNOSIS — E1122 Type 2 diabetes mellitus with diabetic chronic kidney disease: Secondary | ICD-10-CM | POA: Diagnosis present

## 2018-01-25 DIAGNOSIS — N3000 Acute cystitis without hematuria: Secondary | ICD-10-CM | POA: Diagnosis not present

## 2018-01-25 DIAGNOSIS — K5100 Ulcerative (chronic) pancolitis without complications: Secondary | ICD-10-CM | POA: Diagnosis not present

## 2018-01-25 DIAGNOSIS — M6281 Muscle weakness (generalized): Secondary | ICD-10-CM | POA: Diagnosis not present

## 2018-01-25 DIAGNOSIS — K219 Gastro-esophageal reflux disease without esophagitis: Secondary | ICD-10-CM | POA: Diagnosis not present

## 2018-01-25 DIAGNOSIS — I499 Cardiac arrhythmia, unspecified: Secondary | ICD-10-CM | POA: Diagnosis not present

## 2018-01-25 DIAGNOSIS — I4891 Unspecified atrial fibrillation: Secondary | ICD-10-CM | POA: Diagnosis not present

## 2018-01-25 DIAGNOSIS — M6389 Disorders of muscle in diseases classified elsewhere, multiple sites: Secondary | ICD-10-CM | POA: Diagnosis not present

## 2018-01-25 DIAGNOSIS — I5023 Acute on chronic systolic (congestive) heart failure: Secondary | ICD-10-CM | POA: Diagnosis not present

## 2018-01-25 NOTE — Progress Notes (Signed)
Office Visit Note   Patient: [name] [name]           Date of Birth: [birth year]           MRN: [id number] Visit Date: 01/25/2018              Requested by: [name], MD [address] [place], [place] [id number] PCP: [name], MD  Chief Complaint  Patient presents with  . Left Ankle - Pain      HPI: Patient is a 84 year old gentleman status post comminuted bimalleolar left ankle fracture.  Patient did have complications with wound healing and developed infection with the hardware the hardware was removed patient presents at this time for follow-up.  Assessment & Plan: Visit Diagnoses:  1. Closed trimalleolar fracture of ankle, left, with delayed healing, subsequent encounter     Plan: Patient's ankle is stable the swelling is down there is no redness no cellulitis no drainage no signs of infection.  We will allow the patient to begin physical therapy weightbearing as tolerated with a fracture boot on for the left lower extremity he is to wear his knee-high medical compression stockings at all times.  Follow-Up Instructions: Return in about 1 month (around 02/22/2018).   Ortho Exam  Patient is alert, oriented, no adenopathy, well-dressed, normal affect, normal respiratory effort. Examination there is very minimal swelling there is dry cracked peeling skin from where the patient had swelling the incision is well approximated there is no redness no cellulitis no signs of infection he does have calcification of the tibial vessels.  Imaging: No results found. No images are attached to the encounter.  Labs: Lab Results  Component Value Date   HGBA1C 6.4 (H) 12/05/2017   HGBA1C 6.5 (H) 01/04/2017   HGBA1C 6.8 (H) 01/15/2016   REPTSTATUS 12/09/2017 FINAL 12/08/2017   CULT MULTIPLE SPECIES PRESENT, SUGGEST RECOLLECTION (A) 12/08/2017    @LABSALLVALUES (HGBA1)@  There is no height or weight on file to calculate BMI.  Orders:  No orders of the defined  types were placed in this encounter.  No orders of the defined types were placed in this encounter.    Procedures: No procedures performed  Clinical Data: No additional findings.  ROS:  All other systems negative, except as noted in the HPI. Review of Systems  Objective: Vital Signs: There were no vitals taken for this visit.  Specialty Comments:  No specialty comments available.  PMFS History: Patient Active Problem List   Diagnosis Date Noted  . AKI (acute kidney injury) ([place])   . Anaphylactic syndrome   . Drug-induced skin rash   . Acute metabolic encephalopathy 11/94/1740  . Allergic reaction caused by a drug 12/05/2017  . Acute renal failure superimposed on stage 3 chronic kidney disease ([place]) 12/05/2017  . Metabolic acidosis 81/44/8185  . Hypotension 12/05/2017  . Hardware complicating wound infection ([place]) 11/30/2017  . Trimalleolar fracture of ankle, closed, left, initial encounter   . Fall 10/24/2017  . Malleolar fracture, left, closed, initial encounter 10/24/2017  . Fibula fracture 10/24/2017  . Diabetes mellitus with diabetic nephropathy without long-term current use of insulin ([place]) 01/05/2017  . CKD (chronic kidney disease), stage III ([place])   . Chronic combined systolic and diastolic CHF (congestive heart failure) ([place])   . Paroxysmal atrial fibrillation ([name])   . Bleeding gastrointestinal   . Dyslipidemia 10/14/2014  . CAD in native artery- total LAD, s/p RI DES 06/19/14 07/11/2014  . Ulcerative colitis ([place])  Past Medical History:  Diagnosis Date  . Allergic reaction caused by a drug 11/2017  . Anemia   . Atrial flutter ([place])    a. s/p RFCA of counterclockwise cavo-tricuspid isthmus dependent atrial flutter 2009 by Dr. Rayann Heman.  Marland Kitchen CAD (coronary artery disease) 06/2014   a. 06/2014: abnl nuc. Cath: Totally occluded LAD with faint collaterals (not a candidate for CTO PCI), 90% ramus s/p DES, 50-70% OM2.  . Chronic diastolic CHF (congestive heart  failure) ([place])    a. 2D Echo 10/23/13: EF 50-55%, basal mid-inf HK, grade 2 DD, mild MR, mod dilated LA, no sig change from prior.  . CKD (chronic kidney disease), stage III ([place])   . Colostomy in place [facility])   . Complication of anesthesia    " during my kidney stone surgery my heart went out of rhythm  . Compression fracture of L1 lumbar vertebra ([name])   . Diabetes ([place])    TYPE 2   . Dyslipidemia   . Dyspnea   . Frequent PVCs   . GERD (gastroesophageal reflux disease)   . History of blood transfusion   . History of kidney stones   . History of peptic ulcer disease   . HTN (hypertension)   . Hx of acute renal failure 01/02/10-3/24-11   due to hypovolemic shock, gastroenteritis and dehydration,hospitalized . Did requre a few days of dialysis. Cr at discharge was 1.8  . Kidney disease   . Kidney stone may 2009   Right hydronephrosis, S/P stone removal  . Lower back pain   . Macular degeneration   . OA (osteoarthritis) of hip   . Obesity   . Osteopenia   . PAF (paroxysmal atrial fibrillation) ([name])    not on long term anticoagulation due to history of heme positive stools and anemia  . Shingles    episode  . Small bowel obstruction, partial ([place]) 2009   Episode  . Ulcerative colitis ([place])    a. s/p total colectomy remotely.    Family History  Problem Relation Age of Onset  . Colon cancer Mother     Past Surgical History:  Procedure Laterality Date  . BACK SURGERY    . CARDIAC CATHETERIZATION  06/19/2014  . COLON RESECTION    . COLOSTOMY    . CORONARY ANGIOPLASTY    . CORONARY STENT PLACEMENT  06/19/2014   DES       dr Martinique  . HARDWARE REMOVAL Left 12/01/2017   Procedure: HARDWARE REMOVAL LEFT ANKLE;  Surgeon: Newt Minion, MD;  Location: [facility];  Service: Orthopedics;  Laterality: Left;  . HERNIA REPAIR    . LEFT AND RIGHT HEART CATHETERIZATION WITH CORONARY ANGIOGRAM N/A 06/19/2014   Procedure: LEFT AND RIGHT HEART CATHETERIZATION WITH CORONARY ANGIOGRAM;  Surgeon:  Peter M Martinique, MD;  Location[facility] LAB;  Service: Cardiovascular;  Laterality: N/A;  . ORIF ANKLE FRACTURE Left 10/25/2017   Procedure: OPEN REDUCTION INTERNAL FIXATION (ORIF) LEFT ANKLE FRACTURE;  Surgeon: Newt Minion, MD;  Location: [facility];  Service: Orthopedics;  Laterality: Left;  . TONSILLECTOMY     Social History   Occupational History  . Not on file  Tobacco Use  . Smoking status: Never Smoker  . Smokeless tobacco: Never Used  Substance and Sexual Activity  . Alcohol use: No  . Drug use: No  . Sexual activity: Not on file

## 2018-01-26 ENCOUNTER — Telehealth (INDEPENDENT_AMBULATORY_CARE_PROVIDER_SITE_OTHER): Payer: Self-pay | Admitting: Orthopedic Surgery

## 2018-01-26 NOTE — Telephone Encounter (Signed)
Called and lm on vm to advise that per Dr. Sharol Given yesterday he can start with physicla therapy to wear his fx boot with wtbat and medical compression socks on at tall times. He may remove the boot while in bed and to call with any other questions.

## 2018-01-26 NOTE — Telephone Encounter (Signed)
[name] [name] -PT with [facility] called asked if patient can be out of the cam boot when he goes to bed and can patient can start strengthening and range of motion on left ankle. The number to contact [name] is [phone] ([facility])

## 2018-01-30 DIAGNOSIS — N3000 Acute cystitis without hematuria: Secondary | ICD-10-CM | POA: Diagnosis not present

## 2018-01-30 DIAGNOSIS — N202 Calculus of kidney with calculus of ureter: Secondary | ICD-10-CM | POA: Diagnosis not present

## 2018-02-13 DIAGNOSIS — N3941 Urge incontinence: Secondary | ICD-10-CM | POA: Diagnosis not present

## 2018-02-15 DIAGNOSIS — N202 Calculus of kidney with calculus of ureter: Secondary | ICD-10-CM | POA: Diagnosis not present

## 2018-02-15 DIAGNOSIS — R338 Other retention of urine: Secondary | ICD-10-CM | POA: Diagnosis not present

## 2018-02-19 ENCOUNTER — Encounter (HOSPITAL_COMMUNITY): Payer: Self-pay | Admitting: Emergency Medicine

## 2018-02-19 ENCOUNTER — Emergency Department (HOSPITAL_COMMUNITY): Payer: Medicare Other

## 2018-02-19 ENCOUNTER — Other Ambulatory Visit: Payer: Self-pay

## 2018-02-19 ENCOUNTER — Inpatient Hospital Stay (HOSPITAL_COMMUNITY)
Admission: EM | Admit: 2018-02-19 | Discharge: 2018-02-24 | DRG: 640 | Disposition: A | Discharge status: Skilled Nursing Facility | Payer: Medicare Other | Attending: Internal Medicine | Admitting: Internal Medicine

## 2018-02-19 ENCOUNTER — Inpatient Hospital Stay (HOSPITAL_COMMUNITY): Payer: Medicare Other

## 2018-02-19 DIAGNOSIS — I272 Pulmonary hypertension, unspecified: Secondary | ICD-10-CM | POA: Diagnosis present

## 2018-02-19 DIAGNOSIS — I4891 Unspecified atrial fibrillation: Secondary | ICD-10-CM | POA: Diagnosis not present

## 2018-02-19 DIAGNOSIS — G4733 Obstructive sleep apnea (adult) (pediatric): Secondary | ICD-10-CM | POA: Diagnosis present

## 2018-02-19 DIAGNOSIS — H3530 Unspecified macular degeneration: Secondary | ICD-10-CM | POA: Diagnosis present

## 2018-02-19 DIAGNOSIS — M6389 Disorders of muscle in diseases classified elsewhere, multiple sites: Secondary | ICD-10-CM | POA: Diagnosis not present

## 2018-02-19 DIAGNOSIS — Z79899 Other long term (current) drug therapy: Secondary | ICD-10-CM

## 2018-02-19 DIAGNOSIS — Z955 Presence of coronary angioplasty implant and graft: Secondary | ICD-10-CM

## 2018-02-19 DIAGNOSIS — I2582 Chronic total occlusion of coronary artery: Secondary | ICD-10-CM | POA: Diagnosis present

## 2018-02-19 DIAGNOSIS — I2510 Atherosclerotic heart disease of native coronary artery without angina pectoris: Secondary | ICD-10-CM | POA: Diagnosis not present

## 2018-02-19 DIAGNOSIS — R7989 Other specified abnormal findings of blood chemistry: Secondary | ICD-10-CM | POA: Diagnosis present

## 2018-02-19 DIAGNOSIS — Z9889 Other specified postprocedural states: Secondary | ICD-10-CM | POA: Diagnosis not present

## 2018-02-19 DIAGNOSIS — L89152 Pressure ulcer of sacral region, stage 2: Secondary | ICD-10-CM | POA: Diagnosis present

## 2018-02-19 DIAGNOSIS — I480 Paroxysmal atrial fibrillation: Secondary | ICD-10-CM | POA: Diagnosis present

## 2018-02-19 DIAGNOSIS — I5023 Acute on chronic systolic (congestive) heart failure: Secondary | ICD-10-CM

## 2018-02-19 DIAGNOSIS — R001 Bradycardia, unspecified: Secondary | ICD-10-CM | POA: Diagnosis present

## 2018-02-19 DIAGNOSIS — E669 Obesity, unspecified: Secondary | ICD-10-CM | POA: Diagnosis present

## 2018-02-19 DIAGNOSIS — M2550 Pain in unspecified joint: Secondary | ICD-10-CM | POA: Diagnosis not present

## 2018-02-19 DIAGNOSIS — I110 Hypertensive heart disease with heart failure: Secondary | ICD-10-CM | POA: Diagnosis not present

## 2018-02-19 DIAGNOSIS — Z880 Allergy status to penicillin: Secondary | ICD-10-CM

## 2018-02-19 DIAGNOSIS — E785 Hyperlipidemia, unspecified: Secondary | ICD-10-CM | POA: Diagnosis not present

## 2018-02-19 DIAGNOSIS — M8580 Other specified disorders of bone density and structure, unspecified site: Secondary | ICD-10-CM | POA: Diagnosis present

## 2018-02-19 DIAGNOSIS — M1610 Unilateral primary osteoarthritis, unspecified hip: Secondary | ICD-10-CM | POA: Diagnosis present

## 2018-02-19 DIAGNOSIS — E1122 Type 2 diabetes mellitus with diabetic chronic kidney disease: Secondary | ICD-10-CM | POA: Diagnosis present

## 2018-02-19 DIAGNOSIS — I959 Hypotension, unspecified: Secondary | ICD-10-CM | POA: Diagnosis present

## 2018-02-19 DIAGNOSIS — I5030 Unspecified diastolic (congestive) heart failure: Secondary | ICD-10-CM

## 2018-02-19 DIAGNOSIS — I499 Cardiac arrhythmia, unspecified: Secondary | ICD-10-CM | POA: Diagnosis not present

## 2018-02-19 DIAGNOSIS — M6281 Muscle weakness (generalized): Secondary | ICD-10-CM | POA: Diagnosis not present

## 2018-02-19 DIAGNOSIS — N183 Chronic kidney disease, stage 3 unspecified: Secondary | ICD-10-CM | POA: Diagnosis present

## 2018-02-19 DIAGNOSIS — I340 Nonrheumatic mitral (valve) insufficiency: Secondary | ICD-10-CM | POA: Diagnosis not present

## 2018-02-19 DIAGNOSIS — I130 Hypertensive heart and chronic kidney disease with heart failure and stage 1 through stage 4 chronic kidney disease, or unspecified chronic kidney disease: Secondary | ICD-10-CM | POA: Diagnosis present

## 2018-02-19 DIAGNOSIS — Z87442 Personal history of urinary calculi: Secondary | ICD-10-CM

## 2018-02-19 DIAGNOSIS — D631 Anemia in chronic kidney disease: Secondary | ICD-10-CM | POA: Diagnosis present

## 2018-02-19 DIAGNOSIS — Z882 Allergy status to sulfonamides status: Secondary | ICD-10-CM

## 2018-02-19 DIAGNOSIS — I493 Ventricular premature depolarization: Secondary | ICD-10-CM | POA: Diagnosis present

## 2018-02-19 DIAGNOSIS — E875 Hyperkalemia: Secondary | ICD-10-CM | POA: Diagnosis present

## 2018-02-19 DIAGNOSIS — Z7401 Bed confinement status: Secondary | ICD-10-CM | POA: Diagnosis not present

## 2018-02-19 DIAGNOSIS — Z87440 Personal history of urinary (tract) infections: Secondary | ICD-10-CM

## 2018-02-19 DIAGNOSIS — I5043 Acute on chronic combined systolic (congestive) and diastolic (congestive) heart failure: Secondary | ICD-10-CM | POA: Diagnosis not present

## 2018-02-19 DIAGNOSIS — I5033 Acute on chronic diastolic (congestive) heart failure: Secondary | ICD-10-CM

## 2018-02-19 DIAGNOSIS — R278 Other lack of coordination: Secondary | ICD-10-CM | POA: Diagnosis not present

## 2018-02-19 DIAGNOSIS — L8990 Pressure ulcer of unspecified site, unspecified stage: Secondary | ICD-10-CM

## 2018-02-19 DIAGNOSIS — I10 Essential (primary) hypertension: Secondary | ICD-10-CM | POA: Diagnosis present

## 2018-02-19 DIAGNOSIS — Z932 Ileostomy status: Secondary | ICD-10-CM | POA: Diagnosis not present

## 2018-02-19 DIAGNOSIS — Z6833 Body mass index (BMI) 33.0-33.9, adult: Secondary | ICD-10-CM

## 2018-02-19 DIAGNOSIS — R778 Other specified abnormalities of plasma proteins: Secondary | ICD-10-CM | POA: Diagnosis present

## 2018-02-19 DIAGNOSIS — Z881 Allergy status to other antibiotic agents status: Secondary | ICD-10-CM

## 2018-02-19 DIAGNOSIS — S82852D Displaced trimalleolar fracture of left lower leg, subsequent encounter for closed fracture with routine healing: Secondary | ICD-10-CM | POA: Diagnosis not present

## 2018-02-19 DIAGNOSIS — Z933 Colostomy status: Secondary | ICD-10-CM | POA: Diagnosis not present

## 2018-02-19 DIAGNOSIS — R748 Abnormal levels of other serum enzymes: Secondary | ICD-10-CM | POA: Diagnosis not present

## 2018-02-19 DIAGNOSIS — K219 Gastro-esophageal reflux disease without esophagitis: Secondary | ICD-10-CM | POA: Diagnosis present

## 2018-02-19 DIAGNOSIS — R0602 Shortness of breath: Secondary | ICD-10-CM | POA: Diagnosis not present

## 2018-02-19 DIAGNOSIS — T847XXD Infection and inflammatory reaction due to other internal orthopedic prosthetic devices, implants and grafts, subsequent encounter: Secondary | ICD-10-CM | POA: Diagnosis not present

## 2018-02-19 DIAGNOSIS — G629 Polyneuropathy, unspecified: Secondary | ICD-10-CM | POA: Diagnosis not present

## 2018-02-19 DIAGNOSIS — Z888 Allergy status to other drugs, medicaments and biological substances status: Secondary | ICD-10-CM

## 2018-02-19 DIAGNOSIS — K5100 Ulcerative (chronic) pancolitis without complications: Secondary | ICD-10-CM | POA: Diagnosis not present

## 2018-02-19 DIAGNOSIS — Z9049 Acquired absence of other specified parts of digestive tract: Secondary | ICD-10-CM

## 2018-02-19 DIAGNOSIS — Z66 Do not resuscitate: Secondary | ICD-10-CM | POA: Diagnosis present

## 2018-02-19 DIAGNOSIS — R5381 Other malaise: Secondary | ICD-10-CM | POA: Diagnosis not present

## 2018-02-19 DIAGNOSIS — I4819 Other persistent atrial fibrillation: Secondary | ICD-10-CM | POA: Diagnosis present

## 2018-02-19 DIAGNOSIS — E1129 Type 2 diabetes mellitus with other diabetic kidney complication: Secondary | ICD-10-CM | POA: Diagnosis present

## 2018-02-19 DIAGNOSIS — Z8711 Personal history of peptic ulcer disease: Secondary | ICD-10-CM

## 2018-02-19 DIAGNOSIS — R2681 Unsteadiness on feet: Secondary | ICD-10-CM | POA: Diagnosis not present

## 2018-02-19 LAB — CBG MONITORING, ED
Glucose-Capillary: 116 mg/dL — ABNORMAL HIGH (ref 65–99)
Glucose-Capillary: 134 mg/dL — ABNORMAL HIGH (ref 65–99)
Glucose-Capillary: 113 mg/dL — ABNORMAL HIGH (ref 65–99)

## 2018-02-19 LAB — COMPREHENSIVE METABOLIC PANEL
ALT: 24 U/L (ref 17–63)
AST: 37 U/L (ref 15–41)
Albumin: 2.1 g/dL — ABNORMAL LOW (ref 3.5–5.0)
Alkaline Phosphatase: 216 U/L — ABNORMAL HIGH (ref 38–126)
Anion gap: 8 (ref 5–15)
BUN: 54 mg/dL — ABNORMAL HIGH (ref 6–20)
CO2: 22 mmol/L (ref 22–32)
Calcium: 8.2 mg/dL — ABNORMAL LOW (ref 8.9–10.3)
Chloride: 101 mmol/L (ref 101–111)
Creatinine, Ser: 1.77 mg/dL — ABNORMAL HIGH (ref 0.61–1.24)
GFR calc Af Amer: 39 mL/min — ABNORMAL LOW (ref >60)
GFR calc non Af Amer: 34 mL/min — ABNORMAL LOW (ref >60)
Glucose, Bld: 173 mg/dL — ABNORMAL HIGH (ref 65–99)
Potassium: >7.5 mmol/L (ref 3.5–5.1)
Sodium: 131 mmol/L — ABNORMAL LOW (ref 135–145)
Total Bilirubin: 0.7 mg/dL (ref 0.3–1.2)
Total Protein: 6.4 g/dL — ABNORMAL LOW (ref 6.5–8.1)

## 2018-02-19 LAB — BASIC METABOLIC PANEL
Anion gap: 11 (ref 5–15)
Anion gap: 10 (ref 5–15)
BUN: 52 mg/dL — ABNORMAL HIGH (ref 6–20)
BUN: 57 mg/dL — ABNORMAL HIGH (ref 6–20)
CO2: 24 mmol/L (ref 22–32)
CO2: 26 mmol/L (ref 22–32)
Calcium: 8.8 mg/dL — ABNORMAL LOW (ref 8.9–10.3)
Calcium: 8.2 mg/dL — ABNORMAL LOW (ref 8.9–10.3)
Chloride: 101 mmol/L (ref 101–111)
Chloride: 100 mmol/L — ABNORMAL LOW (ref 101–111)
Creatinine, Ser: 1.82 mg/dL — ABNORMAL HIGH (ref 0.61–1.24)
Creatinine, Ser: 2.38 mg/dL — ABNORMAL HIGH (ref 0.61–1.24)
GFR calc Af Amer: 38 mL/min — ABNORMAL LOW (ref >60)
GFR calc Af Amer: 28 mL/min — ABNORMAL LOW (ref >60)
GFR calc non Af Amer: 33 mL/min — ABNORMAL LOW (ref >60)
GFR calc non Af Amer: 24 mL/min — ABNORMAL LOW (ref >60)
Glucose, Bld: 109 mg/dL — ABNORMAL HIGH (ref 65–99)
Glucose, Bld: 117 mg/dL — ABNORMAL HIGH (ref 65–99)
Potassium: 6.1 mmol/L — ABNORMAL HIGH (ref 3.5–5.1)
Potassium: 6.4 mmol/L (ref 3.5–5.1)
Sodium: 136 mmol/L (ref 135–145)
Sodium: 136 mmol/L (ref 135–145)

## 2018-02-19 LAB — I-STAT CHEM 8, ED
BUN: 47 mg/dL — ABNORMAL HIGH (ref 6–20)
Calcium, Ion: 2.05 mmol/L (ref 1.15–1.40)
Chloride: 107 mmol/L (ref 101–111)
Creatinine, Ser: 1.6 mg/dL — ABNORMAL HIGH (ref 0.61–1.24)
Glucose, Bld: 225 mg/dL — ABNORMAL HIGH (ref 65–99)
HCT: 25 % — ABNORMAL LOW (ref 39.0–52.0)
Hemoglobin: 8.5 g/dL — ABNORMAL LOW (ref 13.0–17.0)
Potassium: 6.8 mmol/L (ref 3.5–5.1)
Sodium: 132 mmol/L — ABNORMAL LOW (ref 135–145)
TCO2: 23 mmol/L (ref 22–32)

## 2018-02-19 LAB — CBC WITH DIFFERENTIAL/PLATELET
Basophils Absolute: 0 10*3/uL (ref 0.0–0.1)
Basophils Relative: 0 %
Eosinophils Absolute: 0.1 10*3/uL (ref 0.0–0.7)
Eosinophils Relative: 1 %
HCT: 31.8 % — ABNORMAL LOW (ref 39.0–52.0)
Hemoglobin: 9.9 g/dL — ABNORMAL LOW (ref 13.0–17.0)
Lymphocytes Relative: 13 %
Lymphs Abs: 1.5 10*3/uL (ref 0.7–4.0)
MCH: 29 pg (ref 26.0–34.0)
MCHC: 31.1 g/dL (ref 30.0–36.0)
MCV: 93.3 fL (ref 78.0–100.0)
Monocytes Absolute: 1.4 10*3/uL — ABNORMAL HIGH (ref 0.1–1.0)
Monocytes Relative: 12 %
Neutro Abs: 8.4 10*3/uL — ABNORMAL HIGH (ref 1.7–7.7)
Neutrophils Relative %: 74 %
Platelets: 335 10*3/uL (ref 150–400)
RBC: 3.41 MIL/uL — ABNORMAL LOW (ref 4.22–5.81)
RDW: 16.6 % — ABNORMAL HIGH (ref 11.5–15.5)
WBC: 11.4 10*3/uL — ABNORMAL HIGH (ref 4.0–10.5)

## 2018-02-19 LAB — ECHOCARDIOGRAM COMPLETE: Weight: 3600 oz

## 2018-02-19 LAB — TROPONIN I
Troponin I: 0.06 ng/mL (ref <0.03)
Troponin I: 0.05 ng/mL (ref <0.03)
Troponin I: 0.06 ng/mL (ref <0.03)

## 2018-02-19 LAB — POTASSIUM: Potassium: 7 mmol/L (ref 3.5–5.1)

## 2018-02-19 LAB — BRAIN NATRIURETIC PEPTIDE: B Natriuretic Peptide: 488.1 pg/mL — ABNORMAL HIGH (ref 0.0–100.0)

## 2018-02-19 MED ORDER — SODIUM POLYSTYRENE SULFONATE 15 GM/60ML PO SUSP: 15.0000 g | Freq: Once | ORAL | Status: DC

## 2018-02-19 MED ORDER — SENNOSIDES-DOCUSATE SODIUM 8.6-50 MG PO TABS: 1.0000 | ORAL_TABLET | Freq: Every day | ORAL | Status: DC | PRN

## 2018-02-19 MED ORDER — SODIUM CHLORIDE 0.9 % IV BOLUS
250.0000 mL | Freq: Once | INTRAVENOUS | Status: AC
  Administered 2018-02-19: 250 mL via INTRAVENOUS

## 2018-02-19 MED ORDER — CALCIUM CARBONATE 1250 (500 CA) MG PO TABS
600.0000 mg | ORAL_TABLET | Freq: Every day | ORAL | Status: DC
  Filled 2018-02-19: qty 0.5

## 2018-02-19 MED ORDER — ATROPINE SULFATE 1 MG/10ML IJ SOSY: 0.5000 mg | PREFILLED_SYRINGE | INTRAMUSCULAR | Status: DC | PRN

## 2018-02-19 MED ORDER — ADULT MULTIVITAMIN W/MINERALS CH
1.0000 | ORAL_TABLET | Freq: Every day | ORAL | Status: DC
  Administered 2018-02-19 – 2018-02-23 (×5): 1 via ORAL
  Filled 2018-02-19 (×5): qty 1

## 2018-02-19 MED ORDER — ONDANSETRON HCL 4 MG/2ML IJ SOLN
4.0000 mg | Freq: Once | INTRAMUSCULAR | Status: AC
  Administered 2018-02-19: 4 mg via INTRAVENOUS

## 2018-02-19 MED ORDER — FUROSEMIDE 10 MG/ML IJ SOLN
40.0000 mg | Freq: Once | INTRAMUSCULAR | Status: AC
  Administered 2018-02-19: 40 mg via INTRAVENOUS
  Filled 2018-02-19: qty 4

## 2018-02-19 MED ORDER — PANTOPRAZOLE SODIUM 40 MG PO TBEC
40.0000 mg | DELAYED_RELEASE_TABLET | Freq: Every day | ORAL | Status: DC
  Administered 2018-02-19 – 2018-02-23 (×5): 40 mg via ORAL
  Filled 2018-02-19 (×5): qty 1

## 2018-02-19 MED ORDER — DEXTROSE 50 % IV SOLN
1.0000 | Freq: Once | INTRAVENOUS | Status: AC
  Administered 2018-02-19: 50 mL via INTRAVENOUS
  Filled 2018-02-19: qty 50

## 2018-02-19 MED ORDER — PRO-STAT SUGAR FREE PO LIQD
30.0000 mL | Freq: Every day | ORAL | Status: DC
  Administered 2018-02-20 – 2018-02-22 (×3): 30 mL via ORAL
  Filled 2018-02-19 (×4): qty 30

## 2018-02-19 MED ORDER — GLUCAGON HCL RDNA (DIAGNOSTIC) 1 MG IJ SOLR
1.0000 mg | Freq: Once | INTRAMUSCULAR | Status: AC
  Administered 2018-02-19: 1 mg via INTRAVENOUS
  Filled 2018-02-19: qty 1

## 2018-02-19 MED ORDER — SODIUM CHLORIDE 0.9 % IV SOLN
1.0000 g | Freq: Once | INTRAVENOUS | Status: AC
  Administered 2018-02-19: 1 g via INTRAVENOUS
  Filled 2018-02-19: qty 10

## 2018-02-19 MED ORDER — OXYCODONE-ACETAMINOPHEN 10-325 MG PO TABS: 1.0000 | ORAL_TABLET | ORAL | Status: DC | PRN

## 2018-02-19 MED ORDER — SODIUM POLYSTYRENE SULFONATE PO POWD
15.0000 g | Freq: Once | ORAL | Status: AC
  Administered 2018-02-19: 15 g via ORAL
  Filled 2018-02-19: qty 15

## 2018-02-19 MED ORDER — ONDANSETRON HCL 4 MG/2ML IJ SOLN
4.0000 mg | Freq: Four times a day (QID) | INTRAMUSCULAR | Status: DC | PRN
  Filled 2018-02-19: qty 2

## 2018-02-19 MED ORDER — ASPIRIN EC 81 MG PO TBEC
81.0000 mg | DELAYED_RELEASE_TABLET | Freq: Every day | ORAL | Status: DC
  Administered 2018-02-19 – 2018-02-23 (×4): 81 mg via ORAL
  Filled 2018-02-19 (×5): qty 1

## 2018-02-19 MED ORDER — ACETAMINOPHEN 500 MG PO TABS: 500.0000 mg | ORAL_TABLET | Freq: Four times a day (QID) | ORAL | Status: DC | PRN

## 2018-02-19 MED ORDER — FERROUS SULFATE 325 (65 FE) MG PO TABS: 325.0000 mg | ORAL_TABLET | Freq: Two times a day (BID) | ORAL | Status: DC

## 2018-02-19 MED ORDER — ENOXAPARIN SODIUM 40 MG/0.4ML ~~LOC~~ SOLN
40.0000 mg | SUBCUTANEOUS | Status: DC
  Administered 2018-02-19 – 2018-02-20 (×2): 40 mg via SUBCUTANEOUS
  Filled 2018-02-19 (×3): qty 0.4

## 2018-02-19 MED ORDER — FUROSEMIDE 10 MG/ML IJ SOLN
40.0000 mg | Freq: Two times a day (BID) | INTRAMUSCULAR | Status: DC
  Administered 2018-02-19 – 2018-02-21 (×6): 40 mg via INTRAVENOUS
  Filled 2018-02-19 (×9): qty 4

## 2018-02-19 MED ORDER — TAMSULOSIN HCL 0.4 MG PO CAPS
0.4000 mg | ORAL_CAPSULE | Freq: Every day | ORAL | Status: DC
  Administered 2018-02-19 – 2018-02-22 (×4): 0.4 mg via ORAL
  Filled 2018-02-19 (×4): qty 1

## 2018-02-19 MED ORDER — SODIUM POLYSTYRENE SULFONATE 15 GM/60ML PO SUSP
30.0000 g | Freq: Once | ORAL | Status: AC
  Administered 2018-02-19: 30 g via ORAL

## 2018-02-19 MED ORDER — SODIUM CHLORIDE 0.9 % IV SOLN: 250.0000 mL | INTRAVENOUS | Status: DC | PRN

## 2018-02-19 MED ORDER — FAMOTIDINE 20 MG PO TABS
20.0000 mg | ORAL_TABLET | Freq: Every day | ORAL | Status: DC
  Administered 2018-02-19 – 2018-02-22 (×4): 20 mg via ORAL
  Filled 2018-02-19 (×4): qty 1

## 2018-02-19 MED ORDER — SODIUM CHLORIDE 0.9% FLUSH
3.0000 mL | Freq: Two times a day (BID) | INTRAVENOUS | Status: DC
  Administered 2018-02-19 – 2018-02-22 (×7): 3 mL via INTRAVENOUS

## 2018-02-19 MED ORDER — AMIODARONE HCL 200 MG PO TABS
200.0000 mg | ORAL_TABLET | Freq: Every day | ORAL | Status: DC
  Administered 2018-02-19 – 2018-02-21 (×3): 200 mg via ORAL
  Filled 2018-02-19 (×3): qty 1

## 2018-02-19 MED ORDER — ALBUTEROL SULFATE (2.5 MG/3ML) 0.083% IN NEBU
2.5000 mg | INHALATION_SOLUTION | RESPIRATORY_TRACT | Status: AC
  Administered 2018-02-19 (×3): 2.5 mg via RESPIRATORY_TRACT
  Filled 2018-02-19 (×3): qty 3

## 2018-02-19 MED ORDER — CALCIUM GLUCONATE 10 % IV SOLN
1.0000 g | Freq: Once | INTRAVENOUS | Status: AC
  Administered 2018-02-19: 1 g via INTRAVENOUS
  Filled 2018-02-19: qty 10

## 2018-02-19 MED ORDER — ZOLPIDEM TARTRATE 5 MG PO TABS: 5.0000 mg | ORAL_TABLET | Freq: Every evening | ORAL | Status: DC | PRN

## 2018-02-19 MED ORDER — ALPRAZOLAM 0.25 MG PO TABS: 0.2500 mg | ORAL_TABLET | Freq: Two times a day (BID) | ORAL | Status: DC | PRN

## 2018-02-19 MED ORDER — ATROPINE SULFATE 1 MG/ML IJ SOLN
0.5000 mg | Freq: Once | INTRAMUSCULAR | Status: AC
  Administered 2018-02-19: 0.5 mg via INTRAVENOUS
  Filled 2018-02-19: qty 1

## 2018-02-19 MED ORDER — INSULIN ASPART 100 UNIT/ML ~~LOC~~ SOLN: 0.0000 [IU] | Freq: Every day | SUBCUTANEOUS | Status: DC

## 2018-02-19 MED ORDER — SODIUM POLYSTYRENE SULFONATE 15 GM/60ML PO SUSP
60.0000 g | Freq: Once | ORAL | Status: DC
  Filled 2018-02-19: qty 240

## 2018-02-19 MED ORDER — INSULIN ASPART 100 UNIT/ML ~~LOC~~ SOLN
0.0000 [IU] | Freq: Three times a day (TID) | SUBCUTANEOUS | Status: DC
  Administered 2018-02-19: 1 [IU] via SUBCUTANEOUS
  Administered 2018-02-20: 3 [IU] via SUBCUTANEOUS
  Administered 2018-02-21: 5 [IU] via SUBCUTANEOUS
  Administered 2018-02-22: 3 [IU] via SUBCUTANEOUS
  Administered 2018-02-23: 1 [IU] via SUBCUTANEOUS
  Filled 2018-02-19: qty 1

## 2018-02-19 MED ORDER — DOCUSATE SODIUM 100 MG PO CAPS
100.0000 mg | ORAL_CAPSULE | Freq: Two times a day (BID) | ORAL | Status: DC
  Administered 2018-02-19 – 2018-02-23 (×6): 100 mg via ORAL
  Filled 2018-02-19 (×8): qty 1

## 2018-02-19 MED ORDER — VITAMIN D 1000 UNITS PO TABS
3000.0000 [IU] | ORAL_TABLET | Freq: Every day | ORAL | Status: DC
  Administered 2018-02-19 – 2018-02-22 (×4): 3000 [IU] via ORAL
  Filled 2018-02-19 (×6): qty 3

## 2018-02-19 MED ORDER — HYDRALAZINE HCL 20 MG/ML IJ SOLN: 5.0000 mg | INTRAMUSCULAR | Status: DC | PRN

## 2018-02-19 MED ORDER — SODIUM BICARBONATE 8.4 % IV SOLN
50.0000 meq | Freq: Once | INTRAVENOUS | Status: AC
  Administered 2018-02-19: 50 meq via INTRAVENOUS
  Filled 2018-02-19: qty 50

## 2018-02-19 MED ORDER — SODIUM CHLORIDE 0.9% FLUSH: 3.0000 mL | INTRAVENOUS | Status: DC | PRN

## 2018-02-19 MED ORDER — OXYCODONE HCL 5 MG PO TABS: 5.0000 mg | ORAL_TABLET | ORAL | Status: DC | PRN

## 2018-02-19 MED ORDER — SIMVASTATIN 20 MG PO TABS
20.0000 mg | ORAL_TABLET | Freq: Every day | ORAL | Status: DC
  Administered 2018-02-19 – 2018-02-22 (×4): 20 mg via ORAL
  Filled 2018-02-19 (×4): qty 1

## 2018-02-19 MED ORDER — INSULIN ASPART 100 UNIT/ML ~~LOC~~ SOLN
10.0000 [IU] | Freq: Once | SUBCUTANEOUS | Status: AC
  Administered 2018-02-19: 10 [IU] via INTRAVENOUS
  Filled 2018-02-19: qty 1

## 2018-02-19 MED ORDER — OXYCODONE-ACETAMINOPHEN 5-325 MG PO TABS: 1.0000 | ORAL_TABLET | ORAL | Status: DC | PRN

## 2018-02-19 NOTE — ED Notes (Signed)
Admitting provider at bedside.

## 2018-02-19 NOTE — ED Notes (Signed)
Pt gave verbal consent to this RN to give family member information over the phone

## 2018-02-19 NOTE — Progress Notes (Signed)
  Patient seen and evaluated, chart reviewed, please see EMR for updated orders. Please see full H&P dictated by admitting physician Dr Blaine Hamper for same date of service.    Hyperkalemia with Arrhythmias--- due to potassium over replacement, specifically iatrogenic potassium p.o. intake.  PTA  Patient was taking 90 mEq potassium chloride daily.  Patient developed junctional rhythm and bradycardia and hypotension-----patient received hyperkalemia cocktail, repeat potassium is 6.1, Kayexalate being repeated, further repeat of BMP is pending after repeat dose of Kayexalate  Echocardiogram pending  Patient seen and evaluated, chart reviewed, please see EMR for updated orders. Please see full H&P dictated by admitting physician Dr Blaine Hamper for same date of service.

## 2018-02-19 NOTE — ED Triage Notes (Signed)
Pt transported by EMS from [facility], pt began feeling shob around 2130 last night, worsening throughout the evening.  #20 L hand est by EMS. SB on monitor

## 2018-02-19 NOTE — H&P (Addendum)
History and Physical    [name] [name]:[id number] DOB: 1932/12/04 DOA: 02/19/2018  Referring MD/NP/PA:   PCP: [name], MD   Patient coming from:  The patient is coming from SNF.  At baseline, pt is dependent for most of ADL.   Chief Complaint: Shortness of breath  HPI: [name] [name] is a 84 y.o. male with medical history significant of hypertension, hyperlipidemia, diabetes mellitus, GERD, atrial fibrillation not on anticoagulants, CAD, stent placement, CHF with EF of 40%, CKD-3, colostomy, peptic ulcer disease, iron deficiency anemia, who presents with shortness of breath.  Patient states that she started having shortness of breath at about 1030 PM, which has been progressively getting worse.  He does not have cough, chest pain, fever or chills.  She has nausea, and vomited twice, but no abdominal pain or diarrhea.  Patient denies symptoms of UTI.  Patient states that he has a Foley catheter placed 2 weeks ago after UTI.  He has bilateral leg edema.  No unilateral weakness.  Patient is alert and oriented x3. Per report pt is taking 90 mEq of KCl daily at [facility]  ED Course: pt was found to have troponin 0.06, potassium 7.5, stable renal function, temperature normal, bradycardia, hypotensive with blood pressure 83/75, which improved to 117/43, tachypnea, oxygen saturation 74% which improved to 100% on room air, temperature normal.  Chest x-ray showed pulmonary edema and a bilateral small pleural effusion.  Patient is admitted to stepdown as inpatient.  Cardiology fellow, ?Dr. Raiford Simmonds was consulted.  Review of Systems:   General: no fevers, chills, has poor appetite, has fatigue HEENT: no blurry vision, hearing changes or sore throat Respiratory: has dyspnea, no coughing, wheezing CV: no chest pain, no palpitations GI: no nausea, vomiting, abdominal pain, diarrhea, constipation GU: no dysuria, burning on urination, increased urinary frequency, hematuria  Ext: has leg edema Neuro: no  unilateral weakness, numbness, or tingling, no vision change or hearing loss Skin: no rash, no skin tear. MSK: No muscle spasm, no deformity, no limitation of range of movement in spin Heme: No easy bruising.  Travel history: No recent long distant travel.  Allergy:  Allergies  Allergen Reactions  . Brilinta [Ticagrelor] Shortness Of Breath and Other (See Comments)    Changed to Plavix due to SOB  . Clindamycin/Lincomycin Anaphylaxis    Whole body skin peeling, blisters  . Doxycycline Anaphylaxis  . Penicillins Rash and Other (See Comments)    Has patient had a PCN reaction causing immediate rash, facial/tongue/throat swelling, SOB or lightheadedness with hypotension:Yes Has patient had a PCN reaction causing severe rash involving mucus membranes or skin necrosis: No Has patient had a PCN reaction that required hospitalization: No Has patient had a PCN reaction occurring within the last 10 years: No If all of the above answers are "NO", then may proceed with Cephalosporin use.  . Sulfa Antibiotics Other (See Comments)    Pt does not remember the reaction, but it sure the allergy exists.    Past Medical History:  Diagnosis Date  . Allergic reaction caused by a drug 11/2017  . Anemia   . Atrial flutter ([place])    a. s/p RFCA of counterclockwise cavo-tricuspid isthmus dependent atrial flutter 2009 by Dr. Rayann Heman.  Marland Kitchen CAD (coronary artery disease) 06/2014   a. 06/2014: abnl nuc. Cath: Totally occluded LAD with faint collaterals (not a candidate for CTO PCI), 90% ramus s/p DES, 50-70% OM2.  . Chronic diastolic CHF (congestive heart failure) ([place])    a. 2D  Echo 10/23/13: EF 50-55%, basal mid-inf HK, grade 2 DD, mild MR, mod dilated LA, no sig change from prior.  . CKD (chronic kidney disease), stage III ([place])   . Colostomy in place [facility])   . Complication of anesthesia    " during my kidney stone surgery my heart went out of rhythm  . Compression fracture of L1 lumbar vertebra ([name])   .  Diabetes ([place])    TYPE 2   . Dyslipidemia   . Dyspnea   . Frequent PVCs   . GERD (gastroesophageal reflux disease)   . History of blood transfusion   . History of kidney stones   . History of peptic ulcer disease   . HTN (hypertension)   . Hx of acute renal failure 01/02/10-3/24-11   due to hypovolemic shock, gastroenteritis and dehydration,hospitalized . Did requre a few days of dialysis. Cr at discharge was 1.8  . Kidney disease   . Kidney stone may 2009   Right hydronephrosis, S/P stone removal  . Lower back pain   . Macular degeneration   . OA (osteoarthritis) of hip   . Obesity   . Osteopenia   . PAF (paroxysmal atrial fibrillation) ([name])    not on long term anticoagulation due to history of heme positive stools and anemia  . Shingles    episode  . Small bowel obstruction, partial ([place]) 2009   Episode  . Ulcerative colitis ([place])    a. s/p total colectomy remotely.    Past Surgical History:  Procedure Laterality Date  . BACK SURGERY    . CARDIAC CATHETERIZATION  06/19/2014  . COLON RESECTION    . COLOSTOMY    . CORONARY ANGIOPLASTY    . CORONARY STENT PLACEMENT  06/19/2014   DES       dr Martinique  . HARDWARE REMOVAL Left 12/01/2017   Procedure: HARDWARE REMOVAL LEFT ANKLE;  Surgeon: Newt Minion, MD;  Location: [place];  Service: Orthopedics;  Laterality: Left;  . HERNIA REPAIR    . LEFT AND RIGHT HEART CATHETERIZATION WITH CORONARY ANGIOGRAM N/A 06/19/2014   Procedure: LEFT AND RIGHT HEART CATHETERIZATION WITH CORONARY ANGIOGRAM;  Surgeon: Peter M Martinique, MD;  Location[facility] CATH LAB;  Service: Cardiovascular;  Laterality: N/A;  . ORIF ANKLE FRACTURE Left 10/25/2017   Procedure: OPEN REDUCTION INTERNAL FIXATION (ORIF) LEFT ANKLE FRACTURE;  Surgeon: Newt Minion, MD;  Location: [place];  Service: Orthopedics;  Laterality: Left;  . TONSILLECTOMY      Social History:  reports that he has never smoked. He has never used smokeless tobacco. He reports that he does not drink  alcohol or use drugs.  Family History:  Family History  Problem Relation Age of Onset  . Colon cancer Mother      Prior to Admission medications   Medication Sig Start Date End Date Taking? Authorizing Provider  acetaminophen (TYLENOL) 500 MG tablet Take 1 tablet (500 mg total) by mouth every 6 (six) hours as needed (for pain or headaches). Available over the counter 01/08/17  Yes Hongalgi, Lenis Dickinson, MD  Amino Acids-Protein Hydrolys (FEEDING SUPPLEMENT, PRO-STAT SUGAR FREE 64,) LIQD Take 30 mLs by mouth daily.   Yes [provider]  amiodarone (PACERONE) 200 MG tablet Take 1 tablet (200 mg total) by mouth daily. 07/03/17  Yes Turner, Eber Hong, MD  calcium carbonate (OS-CAL) 600 MG TABS tablet Take 600 mg by mouth daily with breakfast.   Yes [provider]  cholecalciferol (VITAMIN D) 1000 units  tablet Take 3,000 Units by mouth daily.   Yes [provider]  docusate sodium (COLACE) 100 MG capsule Take 1 capsule (100 mg total) by mouth 2 (two) times daily. 10/27/17  Yes Patrecia Pour, Christean Grief, MD  metoprolol succinate (TOPROL-XL) 25 MG 24 hr tablet Take 12.5 mg by mouth daily.   Yes [provider]  Multiple Vitamin (MULTIVITAMIN WITH MINERALS) TABS tablet Take 1 tablet by mouth daily.   Yes [provider]  omeprazole (PRILOSEC) 20 MG capsule Take 20 mg by mouth every evening.   Yes [provider]  oxyCODONE-acetaminophen (PERCOCET) 10-325 MG tablet Take 1 tablet by mouth every 4 (four) hours as needed for pain. 10/27/17  Yes Patrecia Pour, Christean Grief, MD  pioglitazone (ACTOS) 30 MG tablet Take 1 tablet (30 mg total) by mouth daily. 11/05/14  Yes Dunn, Dayna N, PA-C  potassium chloride SA (KLOR-CON M15) 15 MEQ tablet Take 30 mEq by mouth 2 (two) times daily.   Yes [provider]  promethazine (PHENERGAN) 25 MG tablet Take 25 mg by mouth 3 (three) times daily as needed for nausea or vomiting.   Yes [provider]  ranitidine (ZANTAC)  150 MG tablet Take 150 mg by mouth daily.   Yes [provider]  saxagliptin HCl (ONGLYZA) 5 MG TABS tablet Take 0.5 tablets (2.5 mg total) by mouth daily. 12/13/17  Yes Velvet Bathe, MD  senna-docusate (SENNA S) 8.6-50 MG tablet Take 1 tablet by mouth daily as needed for mild constipation.   Yes [provider]  simvastatin (ZOCOR) 20 MG tablet Take 20 mg by mouth at bedtime.    Yes [provider]  tamsulosin (FLOMAX) 0.4 MG CAPS capsule Take 1 capsule (0.4 mg total) by mouth daily after supper. 10/27/14  Yes Florencia Reasons, MD  UNABLE TO FIND Take 120 mLs by mouth daily. Med Name: Med pass   Yes [provider]  carvedilol (COREG) 6.25 MG tablet Take 1 tablet (6.25 mg total) by mouth 2 (two) times daily with a meal. Patient not taking: Reported on 02/19/2018 12/13/17   Velvet Bathe, MD  ferrous sulfate 325 (65 FE) MG tablet Take 1 tablet (325 mg total) by mouth 2 (two) times daily with a meal. Patient not taking: Reported on 02/19/2018 10/27/14   Florencia Reasons, MD    Physical Exam: Vitals:   02/19/18 0600 02/19/18 0615 02/19/18 0630 02/19/18 0645  BP: (!) 133/56 (!) 148/78 (!) 142/75 110/81  Pulse: (!) 105 81 85 81  Resp: 16 20 (!) 23 15  Temp:      TempSrc:      SpO2: 99% 99% 98% 99%  Weight:       General: Not in acute distress HEENT:       Eyes: PERRL, EOMI, no scleral icterus.       ENT: No discharge from the ears and nose, no pharynx injection, no tonsillar enlargement.        Neck: positive JVD, no bruit, no mass felt. Heme: No neck lymph node enlargement. Cardiac: S1/S2, RRR, No murmurs, No gallops or rubs. Respiratory: No rales, wheezing, rhonchi or rubs. GI: Soft, nondistended, nontender, no rebound pain, no organomegaly, BS present. GU: No hematuria Ext: 3+ pitting leg edema bilaterally. 2+DP/PT pulse bilaterally. Musculoskeletal: No joint deformities, No joint redness or warmth, no limitation of ROM in spin. Skin: No rashes.  Neuro: Alert, oriented  X3, cranial nerves II-XII grossly intact, moves all extremities normally.  Psych: Patient is not psychotic, no suicidal  or hemocidal ideation.  Labs on Admission: I have personally reviewed following labs and imaging studies  CBC: Recent Labs  Lab 02/19/18 0347 02/19/18 0549  WBC 11.4*  --   NEUTROABS 8.4*  --   HGB 9.9* 8.5*  HCT 31.8* 25.0*  MCV 93.3  --   PLT 335  --    Basic Metabolic Panel: Recent Labs  Lab 02/19/18 0347 02/19/18 0539 02/19/18 0549  NA 131*  --  132*  K >7.5* 7.0* 6.8*  CL 101  --  107  CO2 22  --   --   GLUCOSE 173*  --  225*  BUN 54*  --  47*  CREATININE 1.77*  --  1.60*  CALCIUM 8.2*  --   --    GFR: Estimated Creatinine Clearance: 41.2 mL/min (A) (by C-G formula based on SCr of 1.6 mg/dL (H)). Liver Function Tests: Recent Labs  Lab 02/19/18 0347  AST 37  ALT 24  ALKPHOS 216*  BILITOT 0.7  PROT 6.4*  ALBUMIN 2.1*   No results for input(s): LIPASE, AMYLASE in the last 168 hours. No results for input(s): AMMONIA in the last 168 hours. Coagulation Profile: No results for input(s): INR, PROTIME in the last 168 hours. Cardiac Enzymes: Recent Labs  Lab 02/19/18 0347  TROPONINI 0.06*   BNP (last 3 results) No results for input(s): PROBNP in the last 8760 hours. HbA1C: No results for input(s): HGBA1C in the last 72 hours. CBG: No results for input(s): GLUCAP in the last 168 hours. Lipid Profile: No results for input(s): CHOL, HDL, LDLCALC, TRIG, CHOLHDL, LDLDIRECT in the last 72 hours. Thyroid Function Tests: No results for input(s): TSH, T4TOTAL, FREET4, T3FREE, THYROIDAB in the last 72 hours. Anemia Panel: No results for input(s): VITAMINB12, FOLATE, FERRITIN, TIBC, IRON, RETICCTPCT in the last 72 hours. Urine analysis:    Component Value Date/Time   COLORURINE YELLOW 12/08/2017 1600   APPEARANCEUR HAZY (A) 12/08/2017 1600   LABSPEC 1.018 12/08/2017 1600   PHURINE 8.0 12/08/2017 1600   GLUCOSEU NEGATIVE 12/08/2017 1600    HGBUR SMALL (A) 12/08/2017 1600   BILIRUBINUR NEGATIVE 12/08/2017 1600   KETONESUR NEGATIVE 12/08/2017 1600   PROTEINUR NEGATIVE 12/08/2017 1600   UROBILINOGEN 0.2 10/22/2014 1052   NITRITE NEGATIVE 12/08/2017 1600   LEUKOCYTESUR MODERATE (A) 12/08/2017 1600   Sepsis Labs: @LABRCNTIP (procalcitonin:4,lacticidven:4) )No results found for this or any previous visit (from the past 240 hour(s)).   Radiological Exams on Admission: Dg Chest Portable 1 View  Result Date: 02/19/2018 CLINICAL DATA:  84 y/o  M; shortness of breath. EXAM: PORTABLE CHEST 1 VIEW COMPARISON:  12/08/2017 chest radiograph FINDINGS: Stable cardiomegaly given projection and technique. Diffuse reticular and patchy opacities of the lungs. Small effusions. Degenerative changes of the mid and lower thoracic spine. IMPRESSION: Stable cardiomegaly. Reticular and patchy opacities of the lungs, likely interstitial and alveolar pulmonary edema. Small bilateral effusions. Electronically Signed   By: Kristine Garbe M.D.   On: 02/19/2018 03:37     EKG: Independently reviewed.  Initial EKG showed junctional rhythm, heart rate 37, bifascicular block, QTC 395   Assessment/Plan Principal Problem:   Hyperkalemia Active Problems:   CAD in native artery- total LAD, s/p RI DES 06/19/14   Dyslipidemia   CKD (chronic kidney disease), stage III ([name])   Acute on chronic combined systolic and diastolic CHF (congestive heart failure) ([name])   Paroxysmal atrial fibrillation ([name])   Bradycardia   Elevated troponin   GERD (gastroesophageal reflux disease)   Type  II diabetes mellitus with renal manifestations ([name])   HTN (hypertension)  Hyperkalemia: Most likely due to iatrogenic potassium p.o. intake.  Patient is taking 90 mEq potassium chloride daily.  Patient developed junctional rhythm and bradycardia and hypotension.  Patient was treated with calcium gluconate, 1 g x 3, D50, NovoLog  10 units, bicarbonate 50 mEq and 40 mg lasix IV in  ED.  Cardiology fellow, Dr. Raiford Simmonds was consulted.  -will admit to SDU as inpt -will Kayexalate 60 g per colostomy -Follow-up with BP map  Bradycardia and junctional rhythm: Secondary to hyperkalemia.  Bradycardia resolved now after treated with atropine and glucagon.  Hypotension also resolved.  Currently heart rates 80s. -Hold metoprolol -Pacer as needed. -Atropine as needed   Elevated trop and hx of CAD in native artery- total LAD, s/p RI DES 06/19/14: no CP. Trop 0.06.  Most likely due to demand ischemia secondary to CHF exacerbation. -trop x 3 -ASA and zocor -f/u 2d ehco.   Acute on chronic combined systolic and diastolic CHF: BNP 773, 3+ leg edema, positive DVT, pulmonary edema chest x-ray, consistent with CHF exacerbation. -Lasix 40 mg bid by IV -trop x 3 -2d echo -Daily weights -strict I/O's -Low salt diet  HLD: -zocor  HTN:  -hold metoprolol -IV Lasix -IV hydralazine prn  CKD (chronic kidney disease), stage III ([place]): stable.  Baseline creatinine 1.60-2.0.  His creatinine is 1.60, BUN 47 -f/u by BMP  Atrial Fibrillation: CHA2DS2-VASc Score is 6, needs oral anticoagulation. Patient is not on AC possibly due to peptic ulcer disease. Heart rate is 80s now.  Metoprolol -Metoprolol on hold -hold amiodarone  GERD: -Protonix -Pepcid   Type II diabetes mellitus with renal manifestations ([place]): Last A1c 6.4 on 12/05/17, well controled. Patient is taking saxagliptin and Acos at home -SSI  DVT ppx: SQ Lovenox Code Status: DNR (I discussed with patient in the presence of her sister who is his POA, explained the meaning of CODE STATUS. Patient wants to be DNR, but OK with atropine or temporary pacer) Family Communication:   Yes, patient's sister at bed side Disposition Plan:  Anticipate discharge back to previous home environment Consults called:  Card fellow, dr. Terrilee Files? Admission status: SDU/inpation       Date of Service 02/19/2018    [facility] Pager [phone]  If 7PM-7AM, please contact night-coverage [email or web address] Password [email or web address] 02/19/2018, 7:10 AM

## 2018-02-19 NOTE — ED Notes (Signed)
Pt's lunch tray arrived. Pt asked if he needed any assistance. Pt stated, "No".

## 2018-02-19 NOTE — ED Notes (Signed)
Family requesting to speak with nurse, Gasper Lloyd [facility] [phone]

## 2018-02-19 NOTE — ED Notes (Signed)
Pt given ice chips ok per RN Autumn

## 2018-02-19 NOTE — ED Provider Notes (Signed)
[facility] EMERGENCY DEPARTMENT Provider Note   CSN: [id number] Arrival date & time: 02/19/18  0246     History   Chief Complaint Chief Complaint  Patient presents with  . Shortness of Breath    HPI [name] [name] is a 84 y.o. male.  Patient presents from nursing home with shortness of breath onset around 9 PM last night.  States is gotten somewhat better.  He has had this issue in the past with change in the weather and states he normally has to open the windows when he has breathing problems.  He was found to be bradycardic in the 30s for EMS with  stable blood pressure and mental status.  Patient does have a history of atrial fibrillation, not anticoagulated, diastolic heart failure, CKD and recent ankle fracture in February.  He denies any chest pain, cough or fever.  He does appear to be volume overloaded.  He reports no recent medication changes.  He is on metoprolol for heart rate control as carvedilol cause hypotension. Patient states he is normally able to lie flat without difficulty breathing.  He denies any dizziness or lightheadedness.  The history is provided by the patient and the EMS personnel.  Shortness of Breath  Associated symptoms include leg swelling. Pertinent negatives include no fever, no headaches, no rhinorrhea, no cough, no chest pain, no vomiting and no abdominal pain.    Past Medical History:  Diagnosis Date  . Allergic reaction caused by a drug 11/2017  . Anemia   . Atrial flutter ([place])    a. s/p RFCA of counterclockwise cavo-tricuspid isthmus dependent atrial flutter 2009 by Dr. Rayann Heman.  Marland Kitchen CAD (coronary artery disease) 06/2014   a. 06/2014: abnl nuc. Cath: Totally occluded LAD with faint collaterals (not a candidate for CTO PCI), 90% ramus s/p DES, 50-70% OM2.  . Chronic diastolic CHF (congestive heart failure) ([place])    a. 2D Echo 10/23/13: EF 50-55%, basal mid-inf HK, grade 2 DD, mild MR, mod dilated LA, no sig change from prior.  .  CKD (chronic kidney disease), stage III ([place])   . Colostomy in place [facility])   . Complication of anesthesia    " during my kidney stone surgery my heart went out of rhythm  . Compression fracture of L1 lumbar vertebra ([name])   . Diabetes ([place])    TYPE 2   . Dyslipidemia   . Dyspnea   . Frequent PVCs   . GERD (gastroesophageal reflux disease)   . History of blood transfusion   . History of kidney stones   . History of peptic ulcer disease   . HTN (hypertension)   . Hx of acute renal failure 01/02/10-3/24-11   due to hypovolemic shock, gastroenteritis and dehydration,hospitalized . Did requre a few days of dialysis. Cr at discharge was 1.8  . Kidney disease   . Kidney stone may 2009   Right hydronephrosis, S/P stone removal  . Lower back pain   . Macular degeneration   . OA (osteoarthritis) of hip   . Obesity   . Osteopenia   . PAF (paroxysmal atrial fibrillation) ([name])    not on long term anticoagulation due to history of heme positive stools and anemia  . Shingles    episode  . Small bowel obstruction, partial ([place]) 2009   Episode  . Ulcerative colitis ([place])    a. s/p total colectomy remotely.    Patient Active Problem List   Diagnosis Date Noted  . AKI (acute  kidney injury) ([place])   . Anaphylactic syndrome   . Drug-induced skin rash   . Acute metabolic encephalopathy 93/26/7124  . Allergic reaction caused by a drug 12/05/2017  . Acute renal failure superimposed on stage 3 chronic kidney disease ([place]) 12/05/2017  . Metabolic acidosis 58/06/9832  . Hypotension 12/05/2017  . Hardware complicating wound infection ([place]) 11/30/2017  . Trimalleolar fracture of ankle, closed, left, initial encounter   . Fall 10/24/2017  . Malleolar fracture, left, closed, initial encounter 10/24/2017  . Fibula fracture 10/24/2017  . Diabetes mellitus with diabetic nephropathy without long-term current use of insulin ([place]) 01/05/2017  . CKD (chronic kidney disease), stage III ([place])   . Chronic  combined systolic and diastolic CHF (congestive heart failure) ([place])   . Paroxysmal atrial fibrillation ([name])   . Bleeding gastrointestinal   . Dyslipidemia 10/14/2014  . CAD in native artery- total LAD, s/p RI DES 06/19/14 07/11/2014  . Ulcerative colitis [facility])     Past Surgical History:  Procedure Laterality Date  . BACK SURGERY    . CARDIAC CATHETERIZATION  06/19/2014  . COLON RESECTION    . COLOSTOMY    . CORONARY ANGIOPLASTY    . CORONARY STENT PLACEMENT  06/19/2014   DES       dr Martinique  . HARDWARE REMOVAL Left 12/01/2017   Procedure: HARDWARE REMOVAL LEFT ANKLE;  Surgeon: Newt Minion, MD;  Location: [facility];  Service: Orthopedics;  Laterality: Left;  . HERNIA REPAIR    . LEFT AND RIGHT HEART CATHETERIZATION WITH CORONARY ANGIOGRAM N/A 06/19/2014   Procedure: LEFT AND RIGHT HEART CATHETERIZATION WITH CORONARY ANGIOGRAM;  Surgeon: Peter M Martinique, MD;  Location: [facility] CATH LAB;  Service: Cardiovascular;  Laterality: N/A;  . ORIF ANKLE FRACTURE Left 10/25/2017   Procedure: OPEN REDUCTION INTERNAL FIXATION (ORIF) LEFT ANKLE FRACTURE;  Surgeon: Newt Minion, MD;  Location: [facility];  Service: Orthopedics;  Laterality: Left;  . TONSILLECTOMY          Home Medications    Prior to Admission medications   Medication Sig Start Date End Date Taking? Authorizing Provider  acetaminophen (TYLENOL) 500 MG tablet Take 1 tablet (500 mg total) by mouth every 6 (six) hours as needed (for pain or headaches). Available over the counter 01/08/17   Modena Jansky, MD  amiodarone (PACERONE) 200 MG tablet Take 1 tablet (200 mg total) by mouth daily. 07/03/17   Sueanne Margarita, MD  aspirin 81 MG tablet Take 81 mg by mouth daily.    [provider]  buPROPion (WELLBUTRIN) 75 MG tablet Take 75 mg by mouth daily.    [provider]  calcium carbonate (OS-CAL) 600 MG TABS tablet Take 600 mg by mouth daily with breakfast.    [provider]  carvedilol (COREG) 6.25 MG tablet Take 1  tablet (6.25 mg total) by mouth 2 (two) times daily with a meal. 12/13/17   Velvet Bathe, MD  cholecalciferol (VITAMIN D) 1000 units tablet Take 3,000 Units by mouth daily.    [provider]  docusate sodium (COLACE) 100 MG capsule Take 1 capsule (100 mg total) by mouth 2 (two) times daily. 10/27/17   Doreatha Lew, MD  ferrous sulfate 325 (65 FE) MG tablet Take 1 tablet (325 mg total) by mouth 2 (two) times daily with a meal. 10/27/14   Florencia Reasons, MD  omeprazole (PRILOSEC) 20 MG capsule Take 20 mg by mouth every evening.    [provider]  oxyCODONE-acetaminophen (PERCOCET)  10-325 MG tablet Take 1 tablet by mouth every 4 (four) hours as needed for pain. 10/27/17   Doreatha Lew, MD  pioglitazone (ACTOS) 30 MG tablet Take 1 tablet (30 mg total) by mouth daily. 11/05/14   Dunn, Nedra Hai, PA-C  promethazine (PHENERGAN) 25 MG tablet Take 25 mg by mouth 3 (three) times daily as needed for nausea or vomiting.    [provider]  ranitidine (ZANTAC) 150 MG tablet Take 150 mg by mouth daily.    [provider]  saxagliptin HCl (ONGLYZA) 5 MG TABS tablet Take 0.5 tablets (2.5 mg total) by mouth daily. 12/13/17   Velvet Bathe, MD  senna-docusate (SENNA S) 8.6-50 MG tablet Take 1 tablet by mouth daily as needed for mild constipation.    [provider]  simvastatin (ZOCOR) 20 MG tablet Take 20 mg by mouth at bedtime.     [provider]  tamsulosin (FLOMAX) 0.4 MG CAPS capsule Take 1 capsule (0.4 mg total) by mouth daily after supper. 10/27/14   Florencia Reasons, MD    Family History Family History  Problem Relation Age of Onset  . Colon cancer Mother     Social History Social History   Tobacco Use  . Smoking status: Never Smoker  . Smokeless tobacco: Never Used  Substance Use Topics  . Alcohol use: No  . Drug use: No     Allergies   Brilinta [ticagrelor]; Clindamycin/lincomycin; Doxycycline; Penicillins; and Sulfa antibiotics   Review  of Systems Review of Systems  Constitutional: Negative for activity change, appetite change and fever.  HENT: Negative for congestion and rhinorrhea.   Eyes: Negative for visual disturbance.  Respiratory: Positive for shortness of breath. Negative for cough and chest tightness.   Cardiovascular: Positive for palpitations and leg swelling. Negative for chest pain.  Gastrointestinal: Negative for abdominal pain, nausea and vomiting.  Genitourinary: Negative for dysuria, hematuria, scrotal swelling and testicular pain.  Musculoskeletal: Negative for arthralgias and myalgias.  Skin: Negative for wound.  Neurological: Negative for dizziness, weakness and headaches.    all other systems are negative except as noted in the HPI and PMH.    Physical Exam Updated Vital Signs BP 103/68   Pulse (!) 37   Temp (!) 96.1 F (35.6 C) (Oral)   Resp 14   SpO2 98%   Physical Exam  Constitutional: He is oriented to person, place, and time. He appears well-developed and well-nourished. No distress.  Chronically ill-appearing, obese  HENT:  Head: Normocephalic and atraumatic.  Mouth/Throat: Oropharynx is clear and moist. No oropharyngeal exudate.  Eyes: Pupils are equal, round, and reactive to light. Conjunctivae and EOM are normal.  Neck: Normal range of motion. Neck supple.  No meningismus.  Cardiovascular: Normal rate, normal heart sounds and intact distal pulses.  No murmur heard. Regular bradycardia, no P waves seen  Pulmonary/Chest: Effort normal. No respiratory distress. He has rales.  Bibasilar crackles  Abdominal: Soft. There is no tenderness. There is no rebound and no guarding.  Colostomy bag in place  Musculoskeletal: Normal range of motion. He exhibits edema. He exhibits no tenderness.  +2 edema to knees bilaterally  Neurological: He is alert and oriented to person, place, and time. No cranial nerve deficit. He exhibits normal muscle tone. Coordination normal.  No ataxia on finger  to nose bilaterally. No pronator drift. 5/5 strength throughout. CN 2-12 intact.Equal grip strength. Sensation intact.   Skin: Skin is warm. Capillary refill takes less than 2 seconds. No rash noted.  Psychiatric: He has a normal mood and affect. His behavior is normal.  Nursing note and vitals reviewed.    ED Treatments / Results  Labs (all labs ordered are listed, but only abnormal results are displayed) Labs Reviewed  CBC WITH DIFFERENTIAL/PLATELET - Abnormal; Notable for the following components:      Result Value   WBC 11.4 (*)    RBC 3.41 (*)    Hemoglobin 9.9 (*)    HCT 31.8 (*)    RDW 16.6 (*)    Neutro Abs 8.4 (*)    Monocytes Absolute 1.4 (*)    All other components within normal limits  COMPREHENSIVE METABOLIC PANEL - Abnormal; Notable for the following components:   Sodium 131 (*)    Potassium >7.5 (*)    Glucose, Bld 173 (*)    BUN 54 (*)    Creatinine, Ser 1.77 (*)    Calcium 8.2 (*)    Total Protein 6.4 (*)    Albumin 2.1 (*)    Alkaline Phosphatase 216 (*)    GFR calc non Af Amer 34 (*)    GFR calc Af Amer 39 (*)    All other components within normal limits  BRAIN NATRIURETIC PEPTIDE - Abnormal; Notable for the following components:   B Natriuretic Peptide 488.1 (*)    All other components within normal limits  TROPONIN I - Abnormal; Notable for the following components:   Troponin I 0.06 (*)    All other components within normal limits  POTASSIUM - Abnormal; Notable for the following components:   Potassium 7.0 (*)    All other components within normal limits  I-STAT CHEM 8, ED - Abnormal; Notable for the following components:   Sodium 132 (*)    Potassium 6.8 (*)    BUN 47 (*)    Creatinine, Ser 1.60 (*)    Glucose, Bld 225 (*)    Calcium, Ion 2.05 (*)    Hemoglobin 8.5 (*)    HCT 25.0 (*)    All other components within normal limits  TROPONIN I  TROPONIN I  TROPONIN I    EKG EKG Interpretation  Date/Time:  Monday Feb 19 2018 02:55:45  EDT Ventricular Rate:  37 PR Interval:    QRS Duration: 155 QT Interval:  503 QTC Calculation: 395 R Axis:   -61 Text Interpretation:  Junctional rhythm RBBB and LAFB Probable left ventricular hypertrophy regular bradycardia, no P waves seen  Confirmed by Ezequiel Essex [phone]) on 02/19/2018 3:12:46 AM   Radiology Dg Chest Portable 1 View  Result Date: 02/19/2018 CLINICAL DATA:  84 y/o  M; shortness of breath. EXAM: PORTABLE CHEST 1 VIEW COMPARISON:  12/08/2017 chest radiograph FINDINGS: Stable cardiomegaly given projection and technique. Diffuse reticular and patchy opacities of the lungs. Small effusions. Degenerative changes of the mid and lower thoracic spine. IMPRESSION: Stable cardiomegaly. Reticular and patchy opacities of the lungs, likely interstitial and alveolar pulmonary edema. Small bilateral effusions. Electronically Signed   By: Kristine Garbe M.D.   On: 02/19/2018 03:37    Procedures Procedures (including critical care time)  Medications Ordered in ED Medications  calcium gluconate 1 g in sodium chloride 0.9 % 100 mL IVPB (1 g Intravenous New Bag/Given 02/19/18 0313)  furosemide (LASIX) injection 40 mg (has no administration in time range)  atropine injection 0.5 mg (0.5 mg Intravenous Given 02/19/18 0309)     Initial Impression / Assessment and Plan / ED Course  I have reviewed the triage vital signs and  the nursing notes.  Pertinent labs & imaging results that were available during my care of the patient were reviewed by me and considered in my medical decision making (see chart for details).    Patient from nursing home with shortness of breath and bradycardia.  Denies any chest pain, cough or fever.  No P waves seen on EKG.  Does have history of atrial fibrillation and is on a beta-blocker.  Patient did respond to atropine with increase in heart rate to the 50s and 60s with still no P waves seen.  Patient responded to atropine as well as glucagon and  calcium.  Discussed with cardiology fellow Dr. Lysle Morales who agrees with symptomatic control and washout of patient's beta-blocker.  Feels patient will not need pacemaker at this point.  Patient's symptoms respond to glucagon and atropine.  Labs show hyperkalemia greater than 7.5.  Nursing home documentation shows that patient was started on 30 mEq of potassium 3 times a day on May 2.  Patient given Kayexalate, bicarb, additional calcium, glucose and insulin.  Patient received multiple doses of calcium and glucagon in the ED and atropine x2.  Received 3 g of calcium and 3 mg of glucagon.  Heart rate has improved to the 100s.  Blood pressure is 133/56.   blood pressure mental status have remained stable.    Admission to stepdown unit d/w Dr. Blaine Hamper. Potassium supplements with obviously need to be stopped. Also stop beta blocker for now.  CRITICAL CARE Performed by: Ezequiel Essex Total critical care time: 60 minutes Critical care time was exclusive of separately billable procedures and treating other patients. Critical care was necessary to treat or prevent imminent or life-threatening deterioration. Critical care was time spent personally by me on the following activities: development of treatment plan with patient and/or surrogate as well as nursing, discussions with consultants, evaluation of patient's response to treatment, examination of patient, obtaining history from patient or surrogate, ordering and performing treatments and interventions, ordering and review of laboratory studies, ordering and review of radiographic studies, pulse oximetry and re-evaluation of patient's condition.  Patient final Clinical Impressions(s) / ED Diagnoses   Final diagnoses:  Hyperkalemia  Bradycardia  Acute on chronic systolic congestive heart failure [facility]    ED Discharge Orders    None       Ezequiel Essex, MD 02/19/18 [id number]

## 2018-02-19 NOTE — ED Notes (Signed)
Paging Admitting r/t hypotension.

## 2018-02-19 NOTE — ED Notes (Signed)
Pt states he is not able to to drink kayexalate--

## 2018-02-19 NOTE — Progress Notes (Signed)
  Echocardiogram 2D Echocardiogram has been performed.  Darlina Sicilian M 02/19/2018, 4:36 PM

## 2018-02-19 NOTE — ED Notes (Signed)
Patient transported to ECHO.

## 2018-02-19 NOTE — ED Notes (Signed)
ORDERED TRAY FOR PT

## 2018-02-19 NOTE — ED Notes (Signed)
Inpt bed ordered for patient. Pt provided ice chips

## 2018-02-19 NOTE — ED Notes (Signed)
Pt drank all of kayexalate over ice.   Sister Macky Lower -- [phone] [place] -- [phone]

## 2018-02-20 DIAGNOSIS — N183 Chronic kidney disease, stage 3 (moderate): Secondary | ICD-10-CM

## 2018-02-20 DIAGNOSIS — R748 Abnormal levels of other serum enzymes: Secondary | ICD-10-CM

## 2018-02-20 DIAGNOSIS — E875 Hyperkalemia: Principal | ICD-10-CM

## 2018-02-20 DIAGNOSIS — I10 Essential (primary) hypertension: Secondary | ICD-10-CM

## 2018-02-20 DIAGNOSIS — I5033 Acute on chronic diastolic (congestive) heart failure: Secondary | ICD-10-CM

## 2018-02-20 DIAGNOSIS — I2510 Atherosclerotic heart disease of native coronary artery without angina pectoris: Secondary | ICD-10-CM

## 2018-02-20 DIAGNOSIS — R001 Bradycardia, unspecified: Secondary | ICD-10-CM

## 2018-02-20 DIAGNOSIS — L8990 Pressure ulcer of unspecified site, unspecified stage: Secondary | ICD-10-CM

## 2018-02-20 DIAGNOSIS — I480 Paroxysmal atrial fibrillation: Secondary | ICD-10-CM

## 2018-02-20 DIAGNOSIS — E785 Hyperlipidemia, unspecified: Secondary | ICD-10-CM

## 2018-02-20 LAB — GLUCOSE, CAPILLARY
Glucose-Capillary: 112 mg/dL — ABNORMAL HIGH (ref 65–99)
Glucose-Capillary: 40 mg/dL — CL (ref 65–99)
Glucose-Capillary: 218 mg/dL — ABNORMAL HIGH (ref 65–99)
Glucose-Capillary: 101 mg/dL — ABNORMAL HIGH (ref 65–99)
Glucose-Capillary: 164 mg/dL — ABNORMAL HIGH (ref 65–99)

## 2018-02-20 LAB — LIPID PANEL
Cholesterol: 97 mg/dL (ref 0–200)
HDL: 36 mg/dL — ABNORMAL LOW (ref >40)
LDL Cholesterol: 44 mg/dL (ref 0–99)
Total CHOL/HDL Ratio: 2.7 RATIO
Triglycerides: 85 mg/dL (ref <150)
VLDL: 17 mg/dL (ref 0–40)

## 2018-02-20 LAB — HEMOGLOBIN A1C
Hgb A1c MFr Bld: 7.5 % — ABNORMAL HIGH (ref 4.8–5.6)
Mean Plasma Glucose: 168.55 mg/dL

## 2018-02-20 LAB — SODIUM, URINE, RANDOM: Sodium, Ur: 76 mmol/L

## 2018-02-20 LAB — BASIC METABOLIC PANEL
Anion gap: 8 (ref 5–15)
BUN: 57 mg/dL — ABNORMAL HIGH (ref 6–20)
CO2: 28 mmol/L (ref 22–32)
Calcium: 7.6 mg/dL — ABNORMAL LOW (ref 8.9–10.3)
Chloride: 98 mmol/L — ABNORMAL LOW (ref 101–111)
Creatinine, Ser: 2.28 mg/dL — ABNORMAL HIGH (ref 0.61–1.24)
GFR calc Af Amer: 29 mL/min — ABNORMAL LOW (ref >60)
GFR calc non Af Amer: 25 mL/min — ABNORMAL LOW (ref >60)
Glucose, Bld: 265 mg/dL — ABNORMAL HIGH (ref 65–99)
Potassium: 5.5 mmol/L — ABNORMAL HIGH (ref 3.5–5.1)
Sodium: 134 mmol/L — ABNORMAL LOW (ref 135–145)

## 2018-02-20 LAB — POTASSIUM
Potassium: 5.5 mmol/L — ABNORMAL HIGH (ref 3.5–5.1)
Potassium: 5.7 mmol/L — ABNORMAL HIGH (ref 3.5–5.1)
Potassium: 5 mmol/L (ref 3.5–5.1)
Potassium: 4.8 mmol/L (ref 3.5–5.1)

## 2018-02-20 LAB — MRSA PCR SCREENING: MRSA by PCR: NEGATIVE

## 2018-02-20 MED ORDER — SODIUM CHLORIDE 0.9 % IV BOLUS
250.0000 mL | Freq: Once | INTRAVENOUS | Status: AC
  Administered 2018-02-20: 250 mL via INTRAVENOUS

## 2018-02-20 MED ORDER — DEXTROSE 10 % IV SOLN
Freq: Once | INTRAVENOUS | Status: AC
  Administered 2018-02-20: 02:00:00 via INTRAVENOUS

## 2018-02-20 MED ORDER — ALBUTEROL SULFATE (2.5 MG/3ML) 0.083% IN NEBU
10.0000 mg | INHALATION_SOLUTION | Freq: Once | RESPIRATORY_TRACT | Status: DC
  Filled 2018-02-20: qty 12

## 2018-02-20 MED ORDER — DILTIAZEM HCL-DEXTROSE 100-5 MG/100ML-% IV SOLN (PREMIX)
5.0000 mg/h | INTRAVENOUS | Status: DC
  Administered 2018-02-20: 5 mg/h via INTRAVENOUS
  Filled 2018-02-20: qty 100

## 2018-02-20 MED ORDER — INSULIN ASPART 100 UNIT/ML ~~LOC~~ SOLN
5.0000 [IU] | Freq: Once | SUBCUTANEOUS | Status: AC
  Administered 2018-02-20: 5 [IU] via SUBCUTANEOUS

## 2018-02-20 MED ORDER — DILTIAZEM HCL 25 MG/5ML IV SOLN
10.0000 mg | Freq: Once | INTRAVENOUS | Status: AC
  Administered 2018-02-20: 10 mg via INTRAVENOUS
  Filled 2018-02-20: qty 5

## 2018-02-20 MED ORDER — HEPARIN SODIUM (PORCINE) 5000 UNIT/ML IJ SOLN
5000.0000 [IU] | Freq: Three times a day (TID) | INTRAMUSCULAR | Status: DC
  Administered 2018-02-21 – 2018-02-22 (×4): 5000 [IU] via SUBCUTANEOUS
  Filled 2018-02-20 (×5): qty 1

## 2018-02-20 MED ORDER — SODIUM ZIRCONIUM CYCLOSILICATE 5 G PO PACK: 10.0000 g | PACK | Freq: Every day | ORAL | Status: DC

## 2018-02-20 MED ORDER — DEXTROSE 5 % IV SOLN
Freq: Once | INTRAVENOUS | Status: AC
  Administered 2018-02-20: 02:00:00 via INTRAVENOUS
  Filled 2018-02-20: qty 100

## 2018-02-20 MED ORDER — DEXTROSE 50 % IV SOLN
1.0000 | Freq: Once | INTRAVENOUS | Status: AC
  Administered 2018-02-20: 50 mL via INTRAVENOUS
  Filled 2018-02-20: qty 50

## 2018-02-20 MED ORDER — PATIROMER SORBITEX CALCIUM 8.4 G PO PACK
8.4000 g | PACK | Freq: Every day | ORAL | Status: DC
  Administered 2018-02-20 – 2018-02-22 (×2): 8.4 g via ORAL
  Filled 2018-02-20 (×4): qty 1

## 2018-02-20 MED ORDER — HEPARIN SODIUM (PORCINE) 5000 UNIT/ML IJ SOLN
5000.0000 [IU] | Freq: Three times a day (TID) | INTRAMUSCULAR | Status: DC
  Administered 2018-02-20: 5000 [IU] via SUBCUTANEOUS
  Filled 2018-02-20: qty 1

## 2018-02-20 MED ORDER — CALCIUM CARBONATE 1250 (500 CA) MG PO TABS
1.0000 | ORAL_TABLET | Freq: Every day | ORAL | Status: DC
  Administered 2018-02-20 – 2018-02-22 (×3): 500 mg via ORAL
  Filled 2018-02-20 (×3): qty 1

## 2018-02-20 NOTE — Progress Notes (Signed)
Text paged Dr Myna Hidalgo  RE: elevated HR 110-140's, awaiting any further orders, no other changes noted, will continue to monitor,

## 2018-02-20 NOTE — Progress Notes (Signed)
Spoke with Dr. Myna Hidalgo Re; Abnormal labs, K+ now 5.5, calcium 7.9 and BS 256, verbal orders received to give 5 Units of Regular Insulin, updated on HR, rhythm, B/P and pt's condition, will continue to monitor.

## 2018-02-20 NOTE — Progress Notes (Signed)
CSW acknowledging consult for patient from SNF - [place]. CSW to follow with full assessment and will support with disposition planning.  Estanislado Emms, [place]

## 2018-02-20 NOTE — Progress Notes (Signed)
D50 given and Bicarb drip started, gave CArdizem 10 MG slow IVP for elevated HR, will get another IV via IV team and start his bolus of NS as per order and will continue to monitor.

## 2018-02-20 NOTE — Progress Notes (Signed)
Hypoglycemic Event  CBG: 40  Treatment: 8 oz orange juice  Symptoms: diaphoretic, delayed responses  Follow-up CBG: Time: 0828 CBG Result: 112  Possible Reasons for Event: patient received insulin overnight as part of treatment for hyperkalemia. Patient did not eat overnight.  Comments/MD notified: Dr. Zigmund Daniel notified while at bedside to assess patient.    Janey Genta

## 2018-02-20 NOTE — Progress Notes (Addendum)
PROGRESS NOTE    [name] [name]  PZW:[id number] DOB: Aug 10, 1933 DOA: 02/19/2018 PCP: [name], MD    Brief Narrative:84 y.o. male with medical history significant of hypertension, hyperlipidemia, diabetes mellitus, GERD, atrial fibrillation not on anticoagulants, CAD, stent placement, CHF with EF of 40%, CKD-3, colostomy, peptic ulcer disease, iron deficiency anemia, who presents with shortness of breath.  Patient states that she started having shortness of breath at about 1030 PM, which has been progressively getting worse.  He does not have cough, chest pain, fever or chills.  She has nausea, and vomited twice, but no abdominal pain or diarrhea.  Patient denies symptoms of UTI.  Patient states that he has a Foley catheter placed 2 weeks ago after UTI.  He has bilateral leg edema.  No unilateral weakness.  Patient is alert and oriented x3. Per report pt is taking 90 mEq of KCl daily at [facility]  ED Course: pt was found to have troponin 0.06, potassium 7.5, stable renal function, temperature normal, bradycardia, hypotensive with blood pressure 83/75, which improved to 117/43, tachypnea, oxygen saturation 74% which improved to 100% on room air, temperature normal.  Chest x-ray showed pulmonary edema and a bilateral small pleural effusion.  Patient is admitted to stepdown as inpatient.  Cardiology fellow, ?Dr. Raiford Simmonds was consulted.     Assessment & Plan:   Principal Problem:   Hyperkalemia Active Problems:   CAD in native artery- total LAD, s/p RI DES 06/19/14   Dyslipidemia   CKD (chronic kidney disease), stage III ([name])   Acute on chronic combined systolic and diastolic CHF (congestive heart failure) ([name])   Paroxysmal atrial fibrillation ([name])   Bradycardia   Elevated troponin   GERD (gastroesophageal reflux disease)   Type II diabetes mellitus with renal manifestations ([name])   HTN (hypertension)   Pressure injury of skin  Hyperkalemia: Most likely due to iatrogenic potassium p.o.  intake.  Patient is taking 90 mEq potassium chloride daily.  Patient developed junctional rhythm and bradycardia and hypotension.  Patient was treated with calcium gluconate, 1 g x 3, D50, NovoLog  10 units, bicarbonate 50 mEq and 40 mg lasix IV in ED.  Cardiology fellow, Dr. Raiford Simmonds was consulted.K 5.5 TODAY.will order [facility].  -Bradycardia and junctional rhythm: Secondary to hyperkalemia.  Bradycardia resolved now after treated with atropine and glucagon.  Hypotension also resolved.  Currently heart rates 80s. -Hold metoprolol -Pacer as needed. -Atropine as needed   Elevated trop and hx of CAD in native artery- total LAD, s/p RI DES 06/19/14: no CP. Trop 0.06.  Most likely due to demand ischemia secondary to CHF exacerbation. -trop x 3 -ASA and zocor -f/u 2d ehco.   Acute on chronic combined systolic and diastolic CHF: BNP 423, 3+ leg edema, pulmonary edema chest x-ray, consistent with CHF exacerbation. -Lasix 40 mg bid by IV HTN:  -hold metoprolol -IV Lasix -IV hydralazine prn  CKD (chronic kidney disease), stage III ([place]): stable.  Baseline creatinine 1.60-2.0.  His creatinine is 1.60, BUN 47 -f/u by BMP  Atrial Fibrillation: CHA2DS2-VASc Score is 6, needs oral anticoagulation. Patient is not on AC possibly due to peptic ulcer disease. Heart rate is 80s now.  Metoprolol -Metoprolol on hold -hold amiodarone  GERD: -Protonix -Pepcid   Type II diabetes mellitus with renal manifestations ([place]): Last A1c 6.4 on 12/05/17, well controled. Patient is taking saxagliptin and Acos at home -SSI     DVT prophylaxis: Lovenox Code Status: DNR Family Communication: No family available Disposition Plan: TBD  Consultants:  [facility]  Procedures NONE Antimicrobials:NONE  Subjective: SLEEPY BLOOD SUGAR LOW..  Objective: Vitals:   02/20/18 1003 02/20/18 1033 02/20/18 1105 02/20/18 1106  BP: 134/68 (!) 119/51  124/64  Pulse: 97 100 100 100  Resp: 15 17 18 14   Temp:   97.8 F (36.6  C)   TempSrc:   Oral   SpO2: 97% 98%  98%  Weight:        Intake/Output Summary (Last 24 hours) at 02/20/2018 1147 Last data filed at 02/20/2018 0940 Gross per 24 hour  Intake 1292.38 ml  Output 3400 ml  Net -2107.62 ml   Filed Weights   02/19/18 0408 02/19/18 2303 02/20/18 0530  Weight: 102.1 kg (225 lb) 106.1 kg (233 lb 14.5 oz) 106.1 kg (233 lb 14.5 oz)    Examination:  General exam: Appears calm and comfortable  Respiratory system: DECREASED BS to auscultation. Respiratory effort normal. Cardiovascular system: S1 & S2 heard, RRR. No JVD, murmurs, rubs, gallops or clicks. No pedal edema. Gastrointestinal system: Abdomen is nondistended, soft and nontender. No organomegaly or masses felt. Normal bowel sounds heard. Central nervous system: Alert and oriented. No focal neurological deficits. Extremities:3 PLUS EDEMA Skin: No rashes, lesions or ulcers Psychiatry: Judgement and insight appear normal. Mood & affect appropriate.     Data Reviewed: I have personally reviewed following labs and imaging studies  CBC: Recent Labs  Lab 02/19/18 0347 02/19/18 0549  WBC 11.4*  --   NEUTROABS 8.4*  --   HGB 9.9* 8.5*  HCT 31.8* 25.0*  MCV 93.3  --   PLT 335  --    Basic Metabolic Panel: Recent Labs  Lab 02/19/18 0347 02/19/18 0539 02/19/18 0549 02/19/18 1240 02/19/18 2016 02/20/18 0152  NA 131*  --  132* 136 136 134*  K >7.5* 7.0* 6.8* 6.1* 6.4* 5.5*  5.5*  CL 101  --  107 101 100* 98*  CO2 22  --   --  24 26 28   GLUCOSE 173*  --  225* 109* 117* 265*  BUN 54*  --  47* 52* 57* 57*  CREATININE 1.77*  --  1.60* 1.82* 2.38* 2.28*  CALCIUM 8.2*  --   --  8.8* 8.2* 7.6*   GFR: Estimated Creatinine Clearance: 29.5 mL/min (A) (by C-G formula based on SCr of 2.28 mg/dL (H)). Liver Function Tests: Recent Labs  Lab 02/19/18 0347  AST 37  ALT 24  ALKPHOS 216*  BILITOT 0.7  PROT 6.4*  ALBUMIN 2.1*   No results for input(s): LIPASE, AMYLASE in the last 168 hours. No  results for input(s): AMMONIA in the last 168 hours. Coagulation Profile: No results for input(s): INR, PROTIME in the last 168 hours. Cardiac Enzymes: Recent Labs  Lab 02/19/18 0347 02/19/18 1240 02/19/18 2016  TROPONINI 0.06* 0.05* 0.06*   BNP (last 3 results) No results for input(s): PROBNP in the last 8760 hours. HbA1C: Recent Labs    02/20/18 0152  HGBA1C 7.5*   CBG: Recent Labs  Lab 02/19/18 1729 02/19/18 2132 02/20/18 0742 02/20/18 0828 02/20/18 1101  GLUCAP 134* 113* 40* 112* 218*   Lipid Profile: Recent Labs    02/20/18 0152  CHOL 97  HDL 36*  LDLCALC 44  TRIG 85  CHOLHDL 2.7   Thyroid Function Tests: No results for input(s): TSH, T4TOTAL, FREET4, T3FREE, THYROIDAB in the last 72 hours. Anemia Panel: No results for input(s): VITAMINB12, FOLATE, FERRITIN, TIBC, IRON, RETICCTPCT in the last 72 hours. Sepsis Labs: No results for  input(s): PROCALCITON, LATICACIDVEN in the last 168 hours.  Recent Results (from the past 240 hour(s))  MRSA PCR Screening     Status: None   Collection Time: 02/19/18 11:20 PM  Result Value Ref Range Status   MRSA by PCR NEGATIVE NEGATIVE Final    Comment:        The GeneXpert MRSA Assay (FDA approved for NASAL specimens only), is one component of a comprehensive MRSA colonization surveillance program. It is not intended to diagnose MRSA infection nor to guide or monitor treatment for MRSA infections. Performed at [facility], [place] [address], [place], [place] [address]          Radiology Studies: Dg Chest Portable 1 View  Result Date: 02/19/2018 CLINICAL DATA:  84 y/o  M; shortness of breath. EXAM: PORTABLE CHEST 1 VIEW COMPARISON:  12/08/2017 chest radiograph FINDINGS: Stable cardiomegaly given projection and technique. Diffuse reticular and patchy opacities of the lungs. Small effusions. Degenerative changes of the mid and lower thoracic spine. IMPRESSION: Stable cardiomegaly. Reticular and patchy  opacities of the lungs, likely interstitial and alveolar pulmonary edema. Small bilateral effusions. Electronically Signed   By: Kristine Garbe M.D.   On: 02/19/2018 03:37        Scheduled Meds: . albuterol  10 mg Nebulization Once  . amiodarone  200 mg Oral Daily  . aspirin EC  81 mg Oral Daily  . calcium carbonate  1 tablet Oral Q breakfast  . cholecalciferol  3,000 Units Oral Daily  . docusate sodium  100 mg Oral BID  . enoxaparin (LOVENOX) injection  40 mg Subcutaneous Q24H  . famotidine  20 mg Oral QHS  . feeding supplement (PRO-STAT SUGAR FREE 64)  30 mL Oral Daily  . furosemide  40 mg Intravenous BID  . insulin aspart  0-5 Units Subcutaneous QHS  . insulin aspart  0-9 Units Subcutaneous TID WC  . multivitamin with minerals  1 tablet Oral Daily  . pantoprazole  40 mg Oral Daily  . simvastatin  20 mg Oral QHS  . sodium chloride flush  3 mL Intravenous Q12H  . tamsulosin  0.4 mg Oral QPC supper   Continuous Infusions: . sodium chloride    . diltiazem (CARDIZEM) infusion Stopped (02/20/18 1062)     LOS: 1 day     Georgette Shell, MD [facility] If 7PM-7AM, please contact night-coverage [email or web address] Password [facility] 02/20/2018, 11:47 AM

## 2018-02-20 NOTE — Progress Notes (Signed)
Text paged RE: Abnormal labs and elevated HR, EKG done, borderline B/P 88-110/40-60's, awaiting call back, will continue to monitor.

## 2018-02-20 NOTE — Consult Note (Signed)
Cardiology Consult    Patient ID: [name] [name] MRN: [id number], DOB/AGE: 1933-04-10   Admit date: 02/19/2018 Date of Consult: 02/20/2018  Primary Physician: [name] Low, MD Primary Cardiologist: [name] [name]  Requesting Provider: Dr. Rodena [name] Reason for Consultation: + trop, shortness of breath  [name] [name] is a 84 y.o. male who is being seen today for the evaluation of + trop and shortness of breath at the request of [name] [name].  Patient Profile    84 yo male with PMH of PAF, CAD with CTO of LAD with collaterals and PCI/DES to large ramus, chronic diastolic HF, CKD, DM who presented with shortness of breath and found to have severe hyperkalemia.   Past Medical History   Past Medical History:  Diagnosis Date  . Allergic reaction caused by a drug 11/2017  . Anemia   . Atrial flutter ([place])    a. s/p RFCA of counterclockwise cavo-tricuspid isthmus dependent atrial flutter 2009 by Dr. Rayann Heman.  Marland Kitchen CAD (coronary artery disease) 06/2014   a. 06/2014: abnl nuc. Cath: Totally occluded LAD with faint collaterals (not a candidate for CTO PCI), 90% ramus s/p DES, 50-70% OM2.  . Chronic diastolic CHF (congestive heart failure) ([place])    a. 2D Echo 10/23/13: EF 50-55%, basal mid-inf HK, grade 2 DD, mild MR, mod dilated LA, no sig change from prior.  . CKD (chronic kidney disease), stage III ([place])   . Colostomy in place [facility])   . Complication of anesthesia    " during my kidney stone surgery my heart went out of rhythm  . Compression fracture of L1 lumbar vertebra ([name])   . Diabetes ([place])    TYPE 2   . Dyslipidemia   . Dyspnea   . Frequent PVCs   . GERD (gastroesophageal reflux disease)   . History of blood transfusion   . History of kidney stones   . History of peptic ulcer disease   . HTN (hypertension)   . Hx of acute renal failure 01/02/10-3/24-11   due to hypovolemic shock, gastroenteritis and dehydration,hospitalized . Did requre a few days of dialysis. Cr at discharge was 1.8  .  Kidney disease   . Kidney stone may 2009   Right hydronephrosis, S/P stone removal  . Lower back pain   . Macular degeneration   . OA (osteoarthritis) of hip   . Obesity   . Osteopenia   . PAF (paroxysmal atrial fibrillation) ([name])    not on long term anticoagulation due to history of heme positive stools and anemia  . Shingles    episode  . Small bowel obstruction, partial ([place]) 2009   Episode  . Ulcerative colitis ([place])    a. s/p total colectomy remotely.    Past Surgical History:  Procedure Laterality Date  . BACK SURGERY    . CARDIAC CATHETERIZATION  06/19/2014  . COLON RESECTION    . COLOSTOMY    . CORONARY ANGIOPLASTY    . CORONARY STENT PLACEMENT  06/19/2014   DES       dr Martinique  . HARDWARE REMOVAL Left 12/01/2017   Procedure: HARDWARE REMOVAL LEFT ANKLE;  Surgeon: Newt Minion, MD;  Location: [place];  Service: Orthopedics;  Laterality: Left;  . HERNIA REPAIR    . LEFT AND RIGHT HEART CATHETERIZATION WITH CORONARY ANGIOGRAM N/A 06/19/2014   Procedure: LEFT AND RIGHT HEART CATHETERIZATION WITH CORONARY ANGIOGRAM;  Surgeon: Peter M Martinique, MD;  Location: [facility] CATH LAB;  Service: Cardiovascular;  Laterality: N/A;  .  ORIF ANKLE FRACTURE Left 10/25/2017   Procedure: OPEN REDUCTION INTERNAL FIXATION (ORIF) LEFT ANKLE FRACTURE;  Surgeon: Newt Minion, MD;  Location: [place];  Service: Orthopedics;  Laterality: Left;  . TONSILLECTOMY       Allergies  Allergies  Allergen Reactions  . Brilinta [Ticagrelor] Shortness Of Breath and Other (See Comments)    Changed to Plavix due to SOB  . Clindamycin/Lincomycin Anaphylaxis    Whole body skin peeling, blisters  . Doxycycline Anaphylaxis  . Penicillins Rash and Other (See Comments)    Has patient had a PCN reaction causing immediate rash, facial/tongue/throat swelling, SOB or lightheadedness with hypotension:Yes Has patient had a PCN reaction causing severe rash involving mucus membranes or skin necrosis: No Has patient had a PCN  reaction that required hospitalization: No Has patient had a PCN reaction occurring within the last 10 years: No If all of the above answers are "NO", then may proceed with Cephalosporin use.  . Sulfa Antibiotics Other (See Comments)    Pt does not remember the reaction, but it sure the allergy exists.    History of Present Illness    [name] [name] is an 84 yo male with PMH PAF, CAD with CTO of LAD with collaterals and PCI/DES to large ramus, chronic diastolic HF, CKD, DM. He is followed by [name] [name] as an outpatient. Reports he has been at a SNF since 1/19. He was last seen in the office on 10/17 with [name] [name] and reported being in his usual state of health and he was continued on ASA, statin, BB and amiodarone.   He reports being seen at the Urology office about 2 weeks ago and dx with a UTI. Had a foley placed and was also placed on a medication, though unable to locate on home meds. States he was Rx 2 tabs TID. On Sunday evening he reports he began to feel short of breath. Called the RN for help to his room. Denies any chest pain. [name] [name] he had felt mildly short of breath several days prior but became acutely worse Sunday.   In the ED his labs showed K+ >7.5, Cr 1.77, BNP 488, Hgb 9.9, Trop 0.06>>0.05. EKG showed junctional bradycardia. He was given D50, novolog, Bicarb and 74m IV lasix in the ED with correction of bradycardia. CXR with small bilateral pleural effusions. He was admitted to Internal medicine for further work up. EKG today showed Afib. His breathing has improved. K+ now 5.7, though Cr has continued rise at 2.28 today.    Inpatient Medications    . albuterol  10 mg Nebulization Once  . amiodarone  200 mg Oral Daily  . aspirin EC  81 mg Oral Daily  . calcium carbonate  1 tablet Oral Q breakfast  . cholecalciferol  3,000 Units Oral Daily  . docusate sodium  100 mg Oral BID  . famotidine  20 mg Oral QHS  . feeding supplement (PRO-STAT SUGAR FREE 64)  30 mL Oral Daily  .  furosemide  40 mg Intravenous BID  . heparin injection (subcutaneous)  5,000 Units Subcutaneous Q8H  . insulin aspart  0-5 Units Subcutaneous QHS  . insulin aspart  0-9 Units Subcutaneous TID WC  . multivitamin with minerals  1 tablet Oral Daily  . pantoprazole  40 mg Oral Daily  . patiromer  8.4 g Oral Daily  . simvastatin  20 mg Oral QHS  . sodium chloride flush  3 mL Intravenous Q12H  . tamsulosin  0.4 mg Oral QPC  supper    Family History    Family History  Problem Relation Age of Onset  . Colon cancer Mother     Social History    Social History   Socioeconomic History  . Marital status: Widowed    Spouse name: Not on file  . Number of children: Not on file  . Years of education: Not on file  . Highest education level: Not on file  Occupational History  . Not on file  Social Needs  . Financial resource strain: Not on file  . Food insecurity:    Worry: Not on file    Inability: Not on file  . Transportation needs:    Medical: Not on file    Non-medical: Not on file  Tobacco Use  . Smoking status: Never Smoker  . Smokeless tobacco: Never Used  Substance and Sexual Activity  . Alcohol use: No  . Drug use: No  . Sexual activity: Not on file  Lifestyle  . Physical activity:    Days per week: Not on file    Minutes per session: Not on file  . Stress: Not on file  Relationships  . Social connections:    Talks on phone: Not on file    Gets together: Not on file    Attends religious service: Not on file    Active member of club or organization: Not on file    Attends meetings of clubs or organizations: Not on file    Relationship status: Not on file  . Intimate partner violence:    Fear of current or ex partner: Not on file    Emotionally abused: Not on file    Physically abused: Not on file    Forced sexual activity: Not on file  Other Topics Concern  . Not on file  Social History Narrative  . Not on file     Review of Systems    See HPI  All other  systems reviewed and are otherwise negative except as noted above.  Physical Exam    Blood pressure 124/64, pulse 100, temperature 97.8 F (36.6 C), temperature source Oral, resp. rate 14, weight 233 lb 14.5 oz (106.1 kg), SpO2 98 %.  General: Pleasant, obese older male, NAD Psych: Normal affect. Neuro: Alert and oriented X 3. Moves all extremities spontaneously. HEENT: Normal  Neck: Supple, no JVD. Lungs:  Resp regular and unlabored, CTA. Heart: RRR no s3, s4, or murmurs. Abdomen: Soft, non-tender, non-distended, BS + x 4.  Extremities: No clubbing, cyanosis, mild LE edema. DP/PT/Radials 2+ and equal bilaterally.  Labs    Troponin (Point of Care Test) No results for input(s): TROPIPOC in the last 72 hours. Recent Labs    02/19/18 0347 02/19/18 1240 02/19/18 2016  TROPONINI 0.06* 0.05* 0.06*   Lab Results  Component Value Date   WBC 11.4 (H) 02/19/2018   HGB 8.5 (L) 02/19/2018   HCT 25.0 (L) 02/19/2018   MCV 93.3 02/19/2018   PLT 335 02/19/2018    Recent Labs  Lab 02/19/18 0347  02/20/18 0152 02/20/18 1209  NA 131*   < > 134*  --   K >7.5*   < > 5.5*  5.5* 5.7*  CL 101   < > 98*  --   CO2 22   < > 28  --   BUN 54*   < > 57*  --   CREATININE 1.77*   < > 2.28*  --   CALCIUM 8.2*   < > 7.6*  --  PROT 6.4*  --   --   --   BILITOT 0.7  --   --   --   ALKPHOS 216*  --   --   --   ALT 24  --   --   --   AST 37  --   --   --   GLUCOSE 173*   < > 265*  --    < > = values in this interval not displayed.   Lab Results  Component Value Date   CHOL 97 02/20/2018   HDL 36 (L) 02/20/2018   LDLCALC 44 02/20/2018   TRIG 85 02/20/2018   No results found for: [facility]   Radiology Studies    Dg Chest Portable 1 View  Result Date: 02/19/2018 CLINICAL DATA:  84 y/o  M; shortness of breath. EXAM: PORTABLE CHEST 1 VIEW COMPARISON:  12/08/2017 chest radiograph FINDINGS: Stable cardiomegaly given projection and technique. Diffuse reticular and patchy opacities of the lungs.  Small effusions. Degenerative changes of the mid and lower thoracic spine. IMPRESSION: Stable cardiomegaly. Reticular and patchy opacities of the lungs, likely interstitial and alveolar pulmonary edema. Small bilateral effusions. Electronically Signed   By: Kristine Garbe M.D.   On: 02/19/2018 03:37    ECG & Cardiac Imaging    EKG:  The EKG was personally reviewed and demonstrates Afib  Echo: 02/19/18  Study Conclusions  - Left ventricle: The cavity size was normal. There was mild focal   basal hypertrophy of the septum. Systolic function was normal.   The estimated ejection fraction was in the range of 50% to 55%.   Wall motion was normal; there were no regional wall motion   abnormalities. Doppler parameters are consistent with abnormal   left ventricular relaxation (grade 1 diastolic dysfunction). - Aortic valve: Trileaflet; mildly thickened, mildly calcified   leaflets. - Mitral valve: Calcified annulus. - Pulmonary arteries: Systolic pressure was mildly increased. PA   peak pressure: 31 mm Hg (S).  Assessment & Plan    84 yo male with PMH of PAF, CAD with CTO of LAD with collaterals and PCI/DES to large ramus, chronic diastolic HF, CKD, DM who presented with shortness of breath and found to have severe hyperkalemia.   1. Hyperkalemia: K+ >7.5 on admission. Corrected in the ED, with improvement to 5.7 today. Bradycardia has now resolved. His breathing has also improved. It is unclear why he was on 77mq of K+ daily, without any diuretic? States he was placed on new medication by Urology about 2 weeks ago but unsure of medication and not listed on med list.   2. Elevated Trop: Suspect this is demand ischemia in the setting of bradycardia and hyperkalemia. He c/o of no chest pain. Echo with normal EF and no WMA.   3. PAF: On amiodarone, not on [place]2 to GIB in the past.   4. Chronic diastolic HF: BNP 4672on admission. Has diuresed well, total UOP 3.3L.   5. CKD:  Baseline Cr seems around 1.9-2.0.  -- follow BMET with diuresis.   SBarnet Pall NP-C Pager [phone]/04/2018, 2:37 PM

## 2018-02-21 DIAGNOSIS — I5043 Acute on chronic combined systolic (congestive) and diastolic (congestive) heart failure: Secondary | ICD-10-CM

## 2018-02-21 LAB — BASIC METABOLIC PANEL
Anion gap: 7 (ref 5–15)
BUN: 51 mg/dL — ABNORMAL HIGH (ref 6–20)
CO2: 32 mmol/L (ref 22–32)
Calcium: 7.7 mg/dL — ABNORMAL LOW (ref 8.9–10.3)
Chloride: 97 mmol/L — ABNORMAL LOW (ref 101–111)
Creatinine, Ser: 2.16 mg/dL — ABNORMAL HIGH (ref 0.61–1.24)
GFR calc Af Amer: 31 mL/min — ABNORMAL LOW (ref >60)
GFR calc non Af Amer: 27 mL/min — ABNORMAL LOW (ref >60)
Glucose, Bld: 156 mg/dL — ABNORMAL HIGH (ref 65–99)
Potassium: 4.5 mmol/L (ref 3.5–5.1)
Sodium: 136 mmol/L (ref 135–145)

## 2018-02-21 LAB — CBC WITH DIFFERENTIAL/PLATELET
Basophils Absolute: 0 10*3/uL (ref 0.0–0.1)
Basophils Relative: 0 %
Eosinophils Absolute: 0.4 10*3/uL (ref 0.0–0.7)
Eosinophils Relative: 5 %
HCT: 31 % — ABNORMAL LOW (ref 39.0–52.0)
Hemoglobin: 9.3 g/dL — ABNORMAL LOW (ref 13.0–17.0)
Lymphocytes Relative: 23 %
Lymphs Abs: 1.7 10*3/uL (ref 0.7–4.0)
MCH: 28.4 pg (ref 26.0–34.0)
MCHC: 30 g/dL (ref 30.0–36.0)
MCV: 94.8 fL (ref 78.0–100.0)
Monocytes Absolute: 0.5 10*3/uL (ref 0.1–1.0)
Monocytes Relative: 7 %
Neutro Abs: 4.8 10*3/uL (ref 1.7–7.7)
Neutrophils Relative %: 65 %
Platelets: 256 10*3/uL (ref 150–400)
RBC: 3.27 MIL/uL — ABNORMAL LOW (ref 4.22–5.81)
RDW: 16.6 % — ABNORMAL HIGH (ref 11.5–15.5)
WBC: 7.3 10*3/uL (ref 4.0–10.5)

## 2018-02-21 LAB — GLUCOSE, CAPILLARY
Glucose-Capillary: 130 mg/dL — ABNORMAL HIGH (ref 65–99)
Glucose-Capillary: 277 mg/dL — ABNORMAL HIGH (ref 65–99)
Glucose-Capillary: 88 mg/dL (ref 65–99)
Glucose-Capillary: 159 mg/dL — ABNORMAL HIGH (ref 65–99)

## 2018-02-21 LAB — POTASSIUM
Potassium: 4.5 mmol/L (ref 3.5–5.1)
Potassium: 4.3 mmol/L (ref 3.5–5.1)
Potassium: 4.4 mmol/L (ref 3.5–5.1)
Potassium: 5.1 mmol/L (ref 3.5–5.1)

## 2018-02-21 MED ORDER — AMIODARONE HCL 200 MG PO TABS
200.0000 mg | ORAL_TABLET | Freq: Two times a day (BID) | ORAL | Status: DC
  Administered 2018-02-21 – 2018-02-23 (×4): 200 mg via ORAL
  Filled 2018-02-21 (×4): qty 1

## 2018-02-21 MED ORDER — AMIODARONE IV BOLUS ONLY 150 MG/100ML
150.0000 mg | Freq: Once | INTRAVENOUS | Status: AC
  Administered 2018-02-21: 150 mg via INTRAVENOUS
  Filled 2018-02-21: qty 100

## 2018-02-21 NOTE — Progress Notes (Signed)
PROGRESS NOTE    [name] [name]  LNL:[id number] DOB: Mar 13, 1933 DOA: 02/19/2018 PCP: [name], MD  Brief Narrative:84 y.o.malewith medical history significant ofhypertension, hyperlipidemia, diabetes mellitus, GERD, atrial fibrillation not on anticoagulants, CAD, stent placement, CHF with EF of 40%, CKD-3, colostomy, peptic ulcer disease, iron deficiency anemia, who presents with shortness of breath.  Patient states that she started having shortness of breath at about 1030PM, which has been progressively getting worse.Hedoes not have cough, chest pain, fever or chills. She has nausea, and vomited twice, but no abdominal pain or diarrhea. Patient denies symptoms of UTI. Patient states that he has a Foley catheter placed 2 weeks ago after UTI.He has bilateral leg edema. No unilateral weakness. Patient is alert and oriented x3. Per report pt is taking 90 mEq of KCl daily at [facility]  ED Course:pt was found to havetroponin 0.06, potassium 7.5, stable renal function, temperature normal, bradycardia, hypotensive with blood pressure 83/75, which improved to 117/43, tachypnea, oxygen saturation 74% which improved to 100% on room air, temperature normal. Chest x-ray showed pulmonary edema and a bilateral small pleural effusion. Patient is admitted to stepdown as inpatient. Cardiology fellow, ?Dr. Raiford Simmonds was consulted.     Assessment & Plan:   Principal Problem:   Hyperkalemia Active Problems:   CAD in native artery- total LAD, s/p RI DES 06/19/14   Dyslipidemia   CKD (chronic kidney disease), stage III ([name])   Acute on chronic combined systolic and diastolic CHF (congestive heart failure) ([name])   Paroxysmal atrial fibrillation ([name])   Bradycardia   Elevated troponin   GERD (gastroesophageal reflux disease)   Type II diabetes mellitus with renal manifestations ([name])   HTN (hypertension)   Pressure injury of skin   Acute on chronic diastolic CHF (congestive heart failure)  ([place])  Hyperkalemia:Most likely due to iatrogenic potassium p.o. intake. Patient is taking90 mEq potassium chloride daily. Patient developed junctional rhythm and bradycardia andhypotension. Patient was treated with calcium gluconate,1 g x 3, D50, NovoLog 10 units, bicarbonate 50 mEq and 40 mg lasix IVin ED. Cardiology fellow, Dr. Raiford Simmonds was consulted.K 4.5 TODAY.s/p [place].  -Bradycardiaandjunctional rhythm:Secondary to hyperkalemia. Bradycardia resolved now aftertreated with atropine and glucagon. Hypotension also resolved.Currently heart rates 80s. -Hold metoprolol -Pacer as needed. -Atropine as needed  Elevated trop and hx ofCAD in native artery- total LAD, s/p RI DES 06/19/14: no CP. Trop 0.06.Most likely due to demand ischemia secondary to CHF exacerbation. -trop x 3 -ASA and zocor -echo noted   Acute on chronic combined systolic and diastolic CHF:BNP 408, 3+ leg edema, pulmonary edema chest x-ray, consistent with CHF exacerbation. -Lasix 40 mg bid by IV he is negative over 4 L overnight. HTN: -holdmetoprolol -IV Lasix -IV hydralazine prn  CKD (chronic kidney disease), stage III ([place]): stable.Baseline creatinine 1.60-2.0. His creatinine is 1.60, BUN 47 -f/u by BMP  Atrial Fibrillation: CHA2DS2-VASc Scoreis 6, needs oral anticoagulation. Patient isnot on ACpossibly due to peptic ulcer disease.Heart rate is80s now.Metoprolol -Metoprolol on hold -holdamiodarone  GERD: -Protonix -Pepcid  Type II diabetes mellitus with renal manifestations ([name]):Last A1c6.4 on 12/05/17, well controled. Patient is takingsaxagliptin and Acosat home -SSI  Left ankle trimalleolar fracture inform Dr. Sharol Given of patient's admission.  Patient had appointment to see him tomorrow at the office.  ?  Urine retention patient tells me that urologist told him his Foley catheter could come out this Monday but he had to go for a funeral so he kept the catheter.  Will  plan to DC the catheter today  and watch for urine output.      DVT prophylaxis: Lovenox Code Status: DNR Family Communication none Disposition Plan:   Hope to discharge in the next 24 to 48 hours provided he is able to urinate without a catheter if not will have to put the catheter back and prior to discharge.  Patient will be discharged back to the skilled nursing facility where he came from. Consultants: [place] [facility]  Procedures none Antimicrobials: None  Subjective: Much more awake and alert and speaking in full sentences.  Asked a lot of questions.  Feels breathing is better.  Objective: Vitals:   02/20/18 2310 02/21/18 0100 02/21/18 0451 02/21/18 0822  BP:  (!) 106/55 112/60 (!) 126/99  Pulse: 75 63 63 (!) 115  Resp: 13 13 (!) 22 (!) 22  Temp:  98 F (36.7 C) 98.1 F (36.7 C) 98 F (36.7 C)  TempSrc:  Oral Oral Axillary  SpO2: (!) 77% 95% 99% 100%  Weight:   103.7 kg (228 lb 9.9 oz)     Intake/Output Summary (Last 24 hours) at 02/21/2018 1119 Last data filed at 02/21/2018 0931 Gross per 24 hour  Intake 963 ml  Output 4975 ml  Net -4012 ml   Filed Weights   02/19/18 2303 02/20/18 0530 02/21/18 0451  Weight: 106.1 kg (233 lb 14.5 oz) 106.1 kg (233 lb 14.5 oz) 103.7 kg (228 lb 9.9 oz)    Examination:  General exam: Appears calm and comfortable  Respiratory system: Clear to auscultation. Respiratory effort normal. Cardiovascular system: S1 & S2 heard, RRR. No JVD, murmurs, rubs, gallops or clicks. No pedal edema. Gastrointestinal system: Abdomen is nondistended, soft and nontender. No organomegaly or masses felt. Normal bowel sounds heard. Central nervous system: Alert and oriented. No focal neurological deficits. Extremities: Symmetric 5 x 5 power. Skin: No rashes, lesions or ulcers Psychiatry: Judgement and insight appear normal. Mood & affect appropriate.     Data Reviewed: I have personally reviewed following labs and imaging studies  CBC: Recent Labs  Lab  02/19/18 0347 02/19/18 0549 02/21/18 0235  WBC 11.4*  --  7.3  NEUTROABS 8.4*  --  4.8  HGB 9.9* 8.5* 9.3*  HCT 31.8* 25.0* 31.0*  MCV 93.3  --  94.8  PLT 335  --  098   Basic Metabolic Panel: Recent Labs  Lab 02/19/18 0347  02/19/18 0549 02/19/18 1240 02/19/18 2016 02/20/18 0152 02/20/18 1209 02/20/18 1629 02/20/18 2010 02/21/18 0235 02/21/18 0958  NA 131*  --  132* 136 136 134*  --   --   --  136  --   K >7.5*   < > 6.8* 6.1* 6.4* 5.5*  5.5* 5.7* 5.0 4.8 4.5  4.5 4.3  CL 101  --  107 101 100* 98*  --   --   --  97*  --   CO2 22  --   --  24 26 28   --   --   --  32  --   GLUCOSE 173*  --  225* 109* 117* 265*  --   --   --  156*  --   BUN 54*  --  47* 52* 57* 57*  --   --   --  51*  --   CREATININE 1.77*  --  1.60* 1.82* 2.38* 2.28*  --   --   --  2.16*  --   CALCIUM 8.2*  --   --  8.8* 8.2* 7.6*  --   --   --  7.7*  --    < > = values in this interval not displayed.   GFR: Estimated Creatinine Clearance: 30.8 mL/min (A) (by C-G formula based on SCr of 2.16 mg/dL (H)). Liver Function Tests: Recent Labs  Lab 02/19/18 0347  AST 37  ALT 24  ALKPHOS 216*  BILITOT 0.7  PROT 6.4*  ALBUMIN 2.1*   No results for input(s): LIPASE, AMYLASE in the last 168 hours. No results for input(s): AMMONIA in the last 168 hours. Coagulation Profile: No results for input(s): INR, PROTIME in the last 168 hours. Cardiac Enzymes: Recent Labs  Lab 02/19/18 0347 02/19/18 1240 02/19/18 2016  TROPONINI 0.06* 0.05* 0.06*   BNP (last 3 results) No results for input(s): PROBNP in the last 8760 hours. HbA1C: Recent Labs    02/20/18 0152  HGBA1C 7.5*   CBG: Recent Labs  Lab 02/20/18 1101 02/20/18 1608 02/20/18 2134 02/21/18 0757 02/21/18 1056  GLUCAP 218* 101* 164* 130* 277*   Lipid Profile: Recent Labs    02/20/18 0152  CHOL 97  HDL 36*  LDLCALC 44  TRIG 85  CHOLHDL 2.7   Thyroid Function Tests: No results for input(s): TSH, T4TOTAL, FREET4, T3FREE, THYROIDAB  in the last 72 hours. Anemia Panel: No results for input(s): VITAMINB12, FOLATE, FERRITIN, TIBC, IRON, RETICCTPCT in the last 72 hours. Sepsis Labs: No results for input(s): PROCALCITON, LATICACIDVEN in the last 168 hours.  Recent Results (from the past 240 hour(s))  MRSA PCR Screening     Status: None   Collection Time: 02/19/18 11:20 PM  Result Value Ref Range Status   MRSA by PCR NEGATIVE NEGATIVE Final    Comment:        The GeneXpert MRSA Assay (FDA approved for NASAL specimens only), is one component of a comprehensive MRSA colonization surveillance program. It is not intended to diagnose MRSA infection nor to guide or monitor treatment for MRSA infections. Performed at [facility], [place] [address], [place], [place] [address]          Radiology Studies: No results found.      Scheduled Meds: . albuterol  10 mg Nebulization Once  . amiodarone  200 mg Oral Daily  . aspirin EC  81 mg Oral Daily  . calcium carbonate  1 tablet Oral Q breakfast  . cholecalciferol  3,000 Units Oral Daily  . docusate sodium  100 mg Oral BID  . famotidine  20 mg Oral QHS  . feeding supplement (PRO-STAT SUGAR FREE 64)  30 mL Oral Daily  . furosemide  40 mg Intravenous BID  . heparin injection (subcutaneous)  5,000 Units Subcutaneous Q8H  . insulin aspart  0-5 Units Subcutaneous QHS  . insulin aspart  0-9 Units Subcutaneous TID WC  . multivitamin with minerals  1 tablet Oral Daily  . pantoprazole  40 mg Oral Daily  . patiromer  8.4 g Oral Daily  . simvastatin  20 mg Oral QHS  . sodium chloride flush  3 mL Intravenous Q12H  . tamsulosin  0.4 mg Oral QPC supper   Continuous Infusions: . sodium chloride    . diltiazem (CARDIZEM) infusion Stopped (02/20/18 1655)     LOS: 2 days     Georgette Shell, MD [facility]  If 7PM-7AM, please contact night-coverage [email or web address] Password [email or web address] 02/21/2018, 11:19 AM

## 2018-02-21 NOTE — Progress Notes (Addendum)
Progress Note  Patient Name: [name] [name] Date of Encounter: 02/21/2018  Primary Cardiologist: [name], MD   Subjective   Pt converted to Afib with poor rate control at 0654 this morning. He is largely unaware of his rhythm and rate. He wants to discharge home.   Inpatient Medications    Scheduled Meds: . albuterol  10 mg Nebulization Once  . amiodarone  200 mg Oral Daily  . aspirin EC  81 mg Oral Daily  . calcium carbonate  1 tablet Oral Q breakfast  . cholecalciferol  3,000 Units Oral Daily  . docusate sodium  100 mg Oral BID  . famotidine  20 mg Oral QHS  . feeding supplement (PRO-STAT SUGAR FREE 64)  30 mL Oral Daily  . furosemide  40 mg Intravenous BID  . heparin injection (subcutaneous)  5,000 Units Subcutaneous Q8H  . insulin aspart  0-5 Units Subcutaneous QHS  . insulin aspart  0-9 Units Subcutaneous TID WC  . multivitamin with minerals  1 tablet Oral Daily  . pantoprazole  40 mg Oral Daily  . patiromer  8.4 g Oral Daily  . simvastatin  20 mg Oral QHS  . sodium chloride flush  3 mL Intravenous Q12H  . tamsulosin  0.4 mg Oral QPC supper   Continuous Infusions: . sodium chloride    . diltiazem (CARDIZEM) infusion Stopped (02/20/18 0823)   PRN Meds: sodium chloride, acetaminophen, ALPRAZolam, atropine, hydrALAZINE, ondansetron (ZOFRAN) IV, oxyCODONE-acetaminophen **AND** oxyCODONE, senna-docusate, sodium chloride flush   Vital Signs    Vitals:   02/20/18 2027 02/20/18 2310 02/21/18 0100 02/21/18 0451  BP:   (!) 106/55 112/60  Pulse: 66 75 63 63  Resp: 20 13 13  (!) 22  Temp:   98 F (36.7 C) 98.1 F (36.7 C)  TempSrc:   Oral Oral  SpO2: 94% (!) 77% 95% 99%  Weight:    228 lb 9.9 oz (103.7 kg)    Intake/Output Summary (Last 24 hours) at 02/21/2018 0802 Last data filed at 02/21/2018 0540 Gross per 24 hour  Intake 740.38 ml  Output 4475 ml  Net -3734.62 ml   Filed Weights   02/19/18 2303 02/20/18 0530 02/21/18 0451  Weight: 233 lb 14.5 oz (106.1  kg) 233 lb 14.5 oz (106.1 kg) 228 lb 9.9 oz (103.7 kg)    Telemetry    NSR --> Afib 0654 today, poor rate control - Personally Reviewed  ECG    No new tracings - Personally Reviewed  Physical Exam   GEN: No acute distress.   Neck: No JVD Cardiac: irregular rhythm, irregular rate, no mumurs Respiratory: respirations unlabored, coarse sounds throughout GI: Soft, nontender, non-distended  MS: trace edema Neuro:  Nonfocal  Psych: Normal affect   Labs    Chemistry Recent Labs  Lab 02/19/18 0347  02/19/18 2016 02/20/18 0152  02/20/18 1629 02/20/18 2010 02/21/18 0235  NA 131*   < > 136 134*  --   --   --  136  K >7.5*   < > 6.4* 5.5*  5.5*   < > 5.0 4.8 4.5  4.5  CL 101   < > 100* 98*  --   --   --  97*  CO2 22   < > 26 28  --   --   --  32  GLUCOSE 173*   < > 117* 265*  --   --   --  156*  BUN 54*   < > 57* 57*  --   --   --  51*  CREATININE 1.77*   < > 2.38* 2.28*  --   --   --  2.16*  CALCIUM 8.2*   < > 8.2* 7.6*  --   --   --  7.7*  PROT 6.4*  --   --   --   --   --   --   --   ALBUMIN 2.1*  --   --   --   --   --   --   --   AST 37  --   --   --   --   --   --   --   ALT 24  --   --   --   --   --   --   --   ALKPHOS 216*  --   --   --   --   --   --   --   BILITOT 0.7  --   --   --   --   --   --   --   GFRNONAA 34*   < > 24* 25*  --   --   --  27*  GFRAA 39*   < > 28* 29*  --   --   --  31*  ANIONGAP 8   < > 10 8  --   --   --  7   < > = values in this interval not displayed.     Hematology Recent Labs  Lab 02/19/18 0347 02/19/18 0549 02/21/18 0235  WBC 11.4*  --  7.3  RBC 3.41*  --  3.27*  HGB 9.9* 8.5* 9.3*  HCT 31.8* 25.0* 31.0*  MCV 93.3  --  94.8  MCH 29.0  --  28.4  MCHC 31.1  --  30.0  RDW 16.6*  --  16.6*  PLT 335  --  256    Cardiac Enzymes Recent Labs  Lab 02/19/18 0347 02/19/18 1240 02/19/18 2016  TROPONINI 0.06* 0.05* 0.06*   No results for input(s): TROPIPOC in the last 168 hours.   BNP Recent Labs  Lab 02/19/18 0347    BNP 488.1*     DDimer No results for input(s): DDIMER in the last 168 hours.   Radiology    No results found.  Cardiac Studies   Echo 02/19/18: Study Conclusions - Left ventricle: The cavity size was normal. There was mild focal   basal hypertrophy of the septum. Systolic function was normal.   The estimated ejection fraction was in the range of 50% to 55%.   Wall motion was normal; there were no regional wall motion   abnormalities. Doppler parameters are consistent with abnormal   left ventricular relaxation (grade 1 diastolic dysfunction). - Aortic valve: Trileaflet; mildly thickened, mildly calcified   leaflets. - Mitral valve: Calcified annulus. - Pulmonary arteries: Systolic pressure was mildly increased. PA   peak pressure: 31 mm Hg (S).  Patient Profile     84 y.o. male with PMH of PAF, CAD with CTO of LAD with collaterals and PCI/DES to large ramus, chronic diastolic HF, CKD, DM who presented with shortness of breath and found to have severe hyperkalemia.   Assessment & Plan    1. Hyperkalemia  - K is down to 4.5, from 7.6 on arrival - per primary team   2. Acute on chronic diastolic heart failure - likely exacerbated by bradycardic arrhythmias - grade 1 DD by echo yesterday - diuresing on 40 mg IV  lasix BID - he is overall net negative 7.8 L with 4.4 L urine output yesterday - weight is 228 lb from 233 lbs on admission   3. PAF - he converted back to Afib this morning 0654 with poor rate control in the 90-120s - amiodarone resumed, consider increasing to 200 mg BID +/- amio bolus - diltiazem drip D/C'ed yesterday morning - start lopressor 25 mg BID - no anticoagulation due to hx of GI bleed - since he can't be anticoagulated, will attempt to convert him back to NSR with medicine   4. Elevated troponin, known CAD with CTO of LAD - troponin 0.06 --> 0.05 --> 0.06 - EKG without clear signs of ischemia - pt denies chest pain - this is likely demand  ischemia in the setting of bradycardia and CTO of LAD - no further ischemic workup at this time   For questions or updates, please contact [facility] Please consult [email or web address] for contact info under Cardiology/STEMI.      Signed, [place], PA  02/21/2018, 8:02 AM

## 2018-02-21 NOTE — Progress Notes (Signed)
Patient started to desat into the 70s overnight on RA.  I placed patient on 2L of O2 and has sats have been up in the 90s throughout the night.  I will keep monitoring patient.

## 2018-02-21 NOTE — Care Management Note (Addendum)
Case Management Note  Patient Details  Name: BENNY DEUTSCHMAN MRN: [id number] Date of Birth: [birth year]  Subjective/Objective: Pt presented for SOB- from [place]. Plan for return to SNF once stable.                     Action/Plan: CSW following for disposition needs. CM will continue to monitor.    Expected Discharge Date:                  Expected Discharge Plan:  Skilled Nursing Facility  In-House Referral:  Clinical Social Work  Discharge planning Services  CM Consult  Post Acute Care Choice:  NA Choice offered to:  NA  DME Arranged:  N/A DME Agency:  NA  HH Arranged:  NA HH Agency:  NA  Status of Service:  Completed, signed off  If discussed at [place] of Stay Meetings, dates discussed:    Additional Comments:  Bethena Roys, RN 02/21/2018, 10:12 AM

## 2018-02-21 NOTE — Consult Note (Signed)
   [facility] CM Inpatient Consult   02/21/2018  CHRISTERPHER CLOS Nov 13, 1932 [id number]   Patient screened for multiple hospitalizations in the past 6 months and a high risk for unplanned readmission score in [place] [facility]. Patient is in the [place] [facility] the [place] [facility] services under patient's Medicare plan. Chart review reveals the patient is from [facility] and the plan is to return.  For questions contact:   Natividad Brood, RN BSN [facility] Liaison  [phone] business mobile phone Toll free office [phone]

## 2018-02-21 NOTE — Progress Notes (Signed)
Patient ID: [name] [name], male   DOB: 08-02-1933, 84 y.o.   MRN: [id number] Patient is seen for evaluation for both lower extremities.  He has stable venous stasis swelling but no ulcerations in either leg.  He has well-healed surgical incisions on the left ankle no redness no cellulitis no pain with range of motion no signs of infection.  Patient is status post removal of infected deep retained hardware.  Patient may be full weightbearing at this time with regular shoewear.

## 2018-02-21 NOTE — Clinical Social Work Note (Signed)
Clinical Social Work Assessment  Patient Details  Name: [name] [name] MRN: [id number] Date of Birth: [birth year]  Date of referral:  02/21/18               Reason for consult:  Facility Placement, Discharge Planning(from [facility])                Permission sought to share information with:  Facility Sport and exercise psychologist, Family Supports Permission granted to share information::  Yes, Verbal Permission Granted  Name::     [name] [name]  Agency::  [facility]  Relationship::  sister  Contact Information:  [phone]  Housing/Transportation Living arrangements for the past 2 months:  [place] of Information:  Patient, Siblings Patient Interpreter Needed:  None Criminal Activity/Legal Involvement Pertinent to Current Situation/Hospitalization:  No - Comment as needed Significant Relationships:  Siblings Lives with:  Self Do you feel safe going back to the place where you live?  Yes Need for family participation in patient care:  Yes (Comment)  Care giving concerns: Patient from [facility]. Has been there for rehab since January. Prior to that, was home independently.  Social Worker assessment / plan: CSW met with patient and sister at bedside. Patient alert and oriented. Patient and sister explained that patient has been at [facility] for rehab since breaking his leg in January. Patient has been private paying for most of the days in rehab. Patient now using Medicare days. Patient and sister agreeable for patient to return to [facility].  CSW spoke with admissions at [facility] and they are following patient. They should be able to offer a bed when patient ready to return. CSW to follow for medical readiness and support with disposition planning.  Employment status:  Retired Forensic scientist:  Medicare PT Recommendations:  Not assessed at this time Information / Referral to community resources:  [place]  Patient/Family's  Response to care: Patient and sister appreciative of care.  Patient/Family's Understanding of and Emotional Response to Diagnosis, Current Treatment, and Prognosis: Patient and sister with good understanding of patient's condition. They have questions about why SNF MD prescribed high doses of potassium for patient. Agreeable to return to SNF.  Emotional Assessment Appearance:  Appears stated age Attitude/Demeanor/Rapport:  Engaged Affect (typically observed):  Appropriate, Accepting, Calm Orientation:  Oriented to Self, Oriented to Place, Oriented to  Time, Oriented to Situation Alcohol / Substance use:  Not Applicable Psych involvement (Current and /or in the community):  No (Comment)  Discharge Needs  Concerns to be addressed:  Discharge Planning Concerns, Care Coordination Readmission within the last 30 days:  No Current discharge risk:  Lives alone, Physical Impairment Barriers to Discharge:  Continued Medical Work up   Estanislado Emms, LCSW 02/21/2018, 11:44 AM

## 2018-02-22 DIAGNOSIS — Z9889 Other specified postprocedural states: Secondary | ICD-10-CM

## 2018-02-22 DIAGNOSIS — I4891 Unspecified atrial fibrillation: Secondary | ICD-10-CM

## 2018-02-22 DIAGNOSIS — K5100 Ulcerative (chronic) pancolitis without complications: Secondary | ICD-10-CM

## 2018-02-22 LAB — GLUCOSE, CAPILLARY
Glucose-Capillary: 143 mg/dL — ABNORMAL HIGH (ref 65–99)
Glucose-Capillary: 148 mg/dL — ABNORMAL HIGH (ref 65–99)
Glucose-Capillary: 226 mg/dL — ABNORMAL HIGH (ref 65–99)
Glucose-Capillary: 99 mg/dL (ref 65–99)

## 2018-02-22 LAB — BASIC METABOLIC PANEL
Anion gap: 9 (ref 5–15)
BUN: 57 mg/dL — ABNORMAL HIGH (ref 6–20)
CO2: 31 mmol/L (ref 22–32)
Calcium: 7.7 mg/dL — ABNORMAL LOW (ref 8.9–10.3)
Chloride: 94 mmol/L — ABNORMAL LOW (ref 101–111)
Creatinine, Ser: 2.13 mg/dL — ABNORMAL HIGH (ref 0.61–1.24)
GFR calc Af Amer: 32 mL/min — ABNORMAL LOW (ref >60)
GFR calc non Af Amer: 27 mL/min — ABNORMAL LOW (ref >60)
Glucose, Bld: 143 mg/dL — ABNORMAL HIGH (ref 65–99)
Potassium: 4.9 mmol/L (ref 3.5–5.1)
Sodium: 134 mmol/L — ABNORMAL LOW (ref 135–145)

## 2018-02-22 LAB — POTASSIUM
Potassium: 4.5 mmol/L (ref 3.5–5.1)
Potassium: 4.6 mmol/L (ref 3.5–5.1)

## 2018-02-22 MED ORDER — FUROSEMIDE 40 MG PO TABS
40.0000 mg | ORAL_TABLET | Freq: Every day | ORAL | Status: DC
  Administered 2018-02-22: 40 mg via ORAL
  Filled 2018-02-22: qty 1

## 2018-02-22 MED ORDER — APIXABAN 2.5 MG PO TABS
2.5000 mg | ORAL_TABLET | Freq: Two times a day (BID) | ORAL | Status: DC
  Administered 2018-02-22 – 2018-02-23 (×3): 2.5 mg via ORAL
  Filled 2018-02-22 (×3): qty 1

## 2018-02-22 MED ORDER — SODIUM CHLORIDE 0.9% FLUSH: 3.0000 mL | INTRAVENOUS | Status: DC | PRN

## 2018-02-22 MED ORDER — SODIUM CHLORIDE 0.9 % IV SOLN
INTRAVENOUS | Status: DC
  Administered 2018-02-23: 10:00:00 via INTRAVENOUS

## 2018-02-22 MED ORDER — HYDROCORTISONE 1 % EX CREA
1.0000 "application " | TOPICAL_CREAM | Freq: Three times a day (TID) | CUTANEOUS | Status: DC | PRN
  Filled 2018-02-22: qty 28

## 2018-02-22 MED ORDER — SODIUM CHLORIDE 0.9% FLUSH
3.0000 mL | Freq: Two times a day (BID) | INTRAVENOUS | Status: DC
  Administered 2018-02-22 – 2018-02-23 (×2): 3 mL via INTRAVENOUS

## 2018-02-22 MED ORDER — SODIUM CHLORIDE 0.9 % IV SOLN: 250.0000 mL | INTRAVENOUS | Status: DC

## 2018-02-22 NOTE — NC FL2 (Signed)
[place] LEVEL OF CARE SCREENING TOOL     IDENTIFICATION  Patient Name: [name] [name] Birthdate: 01/27/1933 Sex: male Admission Date (Current Location): 02/19/2018  [facility] and [place] Number:  Herbalist and Address:  [facility] [place], [place] [address], [place], [place] [address]      Provider Number: [id number]  Attending Physician Name and Address:  Georgette Shell, MD  Relative Name and Phone Number:  Azucena Freed, sister, [phone]    Current Level of Care: Hospital Recommended Level of Care: [place] Prior Approval Number:    Date Approved/Denied:   PASRR Number: [id number]  Discharge Plan: SNF    Current Diagnoses: Patient Active Problem List   Diagnosis Date Noted  . Atrial fibrillation with RVR ([place])   . Pressure injury of skin 02/20/2018  . Acute on chronic diastolic CHF (congestive heart failure) ([place])   . Hyperkalemia 02/19/2018  . Bradycardia 02/19/2018  . Elevated troponin 02/19/2018  . GERD (gastroesophageal reflux disease) 02/19/2018  . Type II diabetes mellitus with renal manifestations ([place]) 02/19/2018  . HTN (hypertension) 02/19/2018  . AKI (acute kidney injury) ([place])   . Anaphylactic syndrome   . Drug-induced skin rash   . Acute metabolic encephalopathy 94/49/6759  . Allergic reaction caused by a drug 12/05/2017  . Acute renal failure superimposed on stage 3 chronic kidney disease ([place]) 12/05/2017  . Metabolic acidosis 16/38/4665  . Hypotension 12/05/2017  . Hardware complicating wound infection ([place]) 11/30/2017  . Trimalleolar fracture of ankle, closed, left, initial encounter   . Fall 10/24/2017  . Malleolar fracture, left, closed, initial encounter 10/24/2017  . Fibula fracture 10/24/2017  . Diabetes mellitus with diabetic nephropathy without long-term current use of insulin ([place]) 01/05/2017  . CKD (chronic kidney disease), stage III ([place])   . Acute on chronic combined  systolic and diastolic CHF (congestive heart failure) ([place])   . Paroxysmal atrial fibrillation ([name])   . Bleeding gastrointestinal   . Dyslipidemia 10/14/2014  . CAD in native artery- total LAD, s/p RI DES 06/19/14 07/11/2014  . Ulcerative colitis ([place])     Orientation RESPIRATION BLADDER Height & Weight     Self, Time, Situation, Place  O2(nasal cannula 2L) Incontinent, Indwelling catheter Weight: 221 lb 1.9 oz (100.3 kg) Height:     BEHAVIORAL SYMPTOMS/MOOD NEUROLOGICAL BOWEL NUTRITION STATUS      Incontinent Diet(please see DC summary)  AMBULATORY STATUS COMMUNICATION OF NEEDS Skin   Extensive Assist Verbally PU Stage and Appropriate Care(PU stage II coccyx, foam dressing)                       Personal Care Assistance Level of Assistance  Bathing, Feeding, Dressing Bathing Assistance: Limited assistance Feeding assistance: Independent Dressing Assistance: Limited assistance     Functional Limitations Info  Sight, Hearing, Speech Sight Info: Adequate Hearing Info: Adequate Speech Info: Adequate    SPECIAL CARE FACTORS FREQUENCY  PT (By licensed PT)     PT Frequency: 5x/week              Contractures Contractures Info: Not present    Additional Factors Info  Code Status, Allergies, Insulin Sliding Scale Code Status Info: DNR Allergies Info: Brilinta Ticagrelor, Clindamycin/lincomycin, Doxycycline, Penicillins, Sulfa Antibiotics   Insulin Sliding Scale Info: novolog 3x/day with meals and at bedtime       Current Medications (02/22/2018):  This is the current hospital active medication list Current Facility-Administered Medications  Medication  Dose Route Frequency Provider Last Rate Last Dose  . 0.9 %  sodium chloride infusion  250 mL Intravenous PRN Ivor Costa, MD      . 0.9 %  sodium chloride infusion  250 mL Intravenous Continuous Turner, Traci R, MD      . acetaminophen (TYLENOL) tablet 500 mg  500 mg Oral Q6H PRN Ivor Costa, MD      . albuterol  (PROVENTIL) (2.5 MG/3ML) 0.083% nebulizer solution 10 mg  10 mg Nebulization Once Opyd, Ilene Qua, MD      . ALPRAZolam Duanne Moron) tablet 0.25 mg  0.25 mg Oral BID PRN Ivor Costa, MD      . amiodarone (PACERONE) tablet 200 mg  200 mg Oral BID Sueanne Margarita, MD   200 mg at 02/22/18 0948  . aspirin EC tablet 81 mg  81 mg Oral Daily Ivor Costa, MD   81 mg at 02/22/18 0948  . atropine 1 MG/10ML injection 0.5 mg  0.5 mg Intravenous PRN Ivor Costa, MD      . calcium carbonate (OS-CAL - dosed in mg of elemental calcium) tablet 500 mg of elemental calcium  1 tablet Oral Q breakfast Skeet Simmer, RPH   500 mg of elemental calcium at 02/22/18 0948  . cholecalciferol (VITAMIN D) tablet 3,000 Units  3,000 Units Oral Daily Ivor Costa, MD   3,000 Units at 02/22/18 [phone]  . docusate sodium (COLACE) capsule 100 mg  100 mg Oral BID Ivor Costa, MD   100 mg at 02/21/18 2210  . famotidine (PEPCID) tablet 20 mg  20 mg Oral QHS Ivor Costa, MD   20 mg at 02/21/18 2211  . feeding supplement (PRO-STAT SUGAR FREE 64) liquid 30 mL  30 mL Oral Daily Ivor Costa, MD   30 mL at 02/22/18 0956  . furosemide (LASIX) tablet 40 mg  40 mg Oral Daily Sueanne Margarita, MD   40 mg at 02/22/18 0959  . heparin injection 5,000 Units  5,000 Units Subcutaneous Q8H Skeet Simmer, RPH   5,000 Units at 02/22/18 3614  . hydrALAZINE (APRESOLINE) injection 5 mg  5 mg Intravenous Q2H PRN Ivor Costa, MD      . hydrocortisone cream 1 % 1 application  1 application Topical TID PRN Turner, Eber Hong, MD      . insulin aspart (novoLOG) injection 0-5 Units  0-5 Units Subcutaneous QHS Ivor Costa, MD      . insulin aspart (novoLOG) injection 0-9 Units  0-9 Units Subcutaneous TID WC Ivor Costa, MD   5 Units at 02/21/18 1201  . multivitamin with minerals tablet 1 tablet  1 tablet Oral Daily Ivor Costa, MD   1 tablet at 02/22/18 [phone]  . ondansetron (ZOFRAN) injection 4 mg  4 mg Intravenous Q6H PRN Ivor Costa, MD      . oxyCODONE-acetaminophen  (PERCOCET/ROXICET) 5-325 MG per tablet 1 tablet  1 tablet Oral Q4H PRN Emokpae, Courage, MD       And  . oxyCODONE (Oxy IR/ROXICODONE) immediate release tablet 5 mg  5 mg Oral Q4H PRN Emokpae, Courage, MD      . pantoprazole (PROTONIX) EC tablet 40 mg  40 mg Oral Daily Ivor Costa, MD   40 mg at 02/22/18 0948  . patiromer Daryll Drown) packet 8.4 g  8.4 g Oral Daily Georgette Shell, MD   8.4 g at 02/22/18 0947  . senna-docusate (Senokot-S) tablet 1 tablet  1 tablet Oral Daily PRN Ivor Costa, MD      .  simvastatin (ZOCOR) tablet 20 mg  20 mg Oral QHS Ivor Costa, MD   20 mg at 02/21/18 2210  . sodium chloride flush (NS) 0.9 % injection 3 mL  3 mL Intravenous Q12H Ivor Costa, MD   3 mL at 02/22/18 0959  . sodium chloride flush (NS) 0.9 % injection 3 mL  3 mL Intravenous PRN Ivor Costa, MD      . sodium chloride flush (NS) 0.9 % injection 3 mL  3 mL Intravenous Q12H Turner, Traci R, MD      . sodium chloride flush (NS) 0.9 % injection 3 mL  3 mL Intravenous PRN Turner, Traci R, MD      . tamsulosin (FLOMAX) capsule 0.4 mg  0.4 mg Oral QPC supper Ivor Costa, MD   0.4 mg at 02/21/18 1720     Discharge Medications: Please see discharge summary for a list of discharge medications.  Relevant Imaging Results:  Relevant Lab Results:   Additional Information SSN: [id number]  Estanislado Emms, LCSW

## 2018-02-22 NOTE — Evaluation (Signed)
Physical Therapy Evaluation Patient Details Name: [name] [name] MRN: [id number] DOB: 09-04-1933 Today's Date: 02/22/2018   History of Present Illness  84 y.o. male with PMH of PAF, CAD with CTO of LAD with collaterals and PCI/DES to large ramus, chronic diastolic HF, CKD, DM who presented with shortness of breath and found to have severe hyperkalemia.   Clinical Impression  Pt admitted with above diagnosis. Pt currently with functional limitations due to the deficits listed below (see PT Problem List). Pt was able to stand and pivot to chair with RW with min assist for stability.  Limited in mobility due to afib with incr HR.  Will follow acutely.  MD is planning for cardioversion therefore will continue to follow for mobility.   Pt will benefit from skilled PT to increase their independence and safety with mobility to allow discharge to the venue listed below.      Follow Up Recommendations SNF;Supervision/Assistance - 24 hour    Equipment Recommendations  None recommended by PT    Recommendations for Other Services       Precautions / Restrictions Precautions Precautions: Fall Required Braces or Orthoses: Other Brace/Splint(she has a CAM boot for left foot and shoe for right in room) Other Brace/Splint: MD states he can WBAT now and does not need post op shoes but that is all her has in the room. Restrictions Weight Bearing Restrictions: Yes LLE Weight Bearing: Weight bearing as tolerated      Mobility  Bed Mobility Overal bed mobility: Needs Assistance Bed Mobility: Supine to Sit     Supine to sit: Min assist     General bed mobility comments: Needed assist for elevation of trunk and used bed rail.  Transfers Overall transfer level: Needs assistance Equipment used: Rolling walker (2 wheeled) Transfers: Sit to/from [facility] Sit to Stand: Min assist Stand pivot transfers: Min assist       General transfer comment: Pt generally unsteady even with RW  needing min steadying assist.  Pt Dizzy as well and did not feel up to standing for much time.  Pt HR 112-123 bpm with afib per monitor.  BP 106/77.  Pt on 3L O2 with sats 93%.    Ambulation/Gait             General Gait Details: unable due to incr HR and afib  Stairs            Wheelchair Mobility    Modified Rankin (Stroke Patients Only)       Balance Overall balance assessment: Needs assistance;History of Falls Sitting-balance support: No upper extremity supported;Feet supported Sitting balance-[name]: Fair     Standing balance support: Bilateral upper extremity supported;During functional activity Standing balance-[name]: Poor Standing balance comment: relies on RW for support                             Pertinent Vitals/Pain Pain Assessment: No/denies pain    Home Living Family/patient expects to be discharged to:: Skilled nursing facility Living Arrangements: Alone Available Help at Discharge: Family;Available PRN/intermittently Type of Home: House Home Access: Stairs to enter;Ramped entrance Entrance Stairs-Rails: None Entrance Stairs-Number of Steps: 1 Home Layout: One level Home Equipment: Walker - 2 wheels;Walker - 4 wheels;Cane - single point;Bedside commode;Wheelchair - power;Transport chair      Prior Function Level of Independence: Needs assistance   Gait / Transfers Assistance Needed: working with therapy at SNF - walking 100 feet with  RW and min assist at SNF  ADL's / Homemaking Assistance Needed: required assist from SNF staff for ADL  Comments: Has been at [place] with therapy since Jan of this year.       Hand Dominance   Dominant Hand: Right    Extremity/Trunk Assessment   Upper Extremity Assessment Upper Extremity Assessment: Defer to OT evaluation    Lower Extremity Assessment Lower Extremity Assessment: Generalized weakness    Cervical / Trunk Assessment Cervical / Trunk Assessment: Kyphotic   Communication   Communication: No difficulties  Cognition Arousal/Alertness: Awake/alert Behavior During Therapy: WFL for tasks assessed/performed Overall Cognitive Status: Within Functional Limits for tasks assessed                                        General Comments      Exercises General Exercises - Lower Extremity Long Arc Quad: AROM;Both;10 reps;Seated   Assessment/Plan    PT Assessment Patient needs continued PT services  PT Problem List Decreased mobility;Decreased coordination;Decreased knowledge of use of DME;Decreased safety awareness;Decreased knowledge of precautions;Decreased activity tolerance;Decreased strength;Decreased balance       PT Treatment Interventions DME instruction;Gait training;Functional mobility training;Therapeutic activities;Therapeutic exercise;Balance training;Patient/family education    PT Goals (Current goals can be found in the Care Plan section)  Acute Rehab PT Goals Patient Stated Goal: to get better PT Goal Formulation: With patient Time For Goal Achievement: 03/08/18 Potential to Achieve Goals: Good    Frequency Min 2X/week   Barriers to discharge Decreased caregiver support      Co-evaluation               AM-PAC PT "6 Clicks" Daily Activity  Outcome Measure Difficulty turning over in bed (including adjusting bedclothes, sheets and blankets)?: Unable Difficulty moving from lying on back to sitting on the side of the bed? : Unable Difficulty sitting down on and standing up from a chair with arms (e.g., wheelchair, bedside commode, etc,.)?: A Little Help needed moving to and from a bed to chair (including a wheelchair)?: A Little Help needed walking in hospital room?: Total Help needed climbing 3-5 steps with a railing? : Total 6 Click Score: 10    End of Session Equipment Utilized During Treatment: Gait belt;Oxygen Activity Tolerance: Patient limited by fatigue Patient left: in chair;with call  bell/phone within reach;with chair alarm set Nurse Communication: Mobility status PT Visit Diagnosis: Unsteadiness on feet (R26.81);Muscle weakness (generalized) (M62.81);History of falling (Z91.81)    Time: 5027-7412 PT Time Calculation (min) (ACUTE ONLY): 44 min   Charges:   PT Evaluation $PT Eval Moderate Complexity: 1 Mod PT Treatments $Therapeutic Exercise: 8-22 mins $Therapeutic Activity: 8-22 mins   PT G Codes:        Khalidah Herbold,PT Acute Rehabilitation [phone] (pager)   Denice Paradise 02/22/2018, 11:13 AM

## 2018-02-22 NOTE — Progress Notes (Signed)
ANTICOAGULATION CONSULT NOTE - Initial Consult  Pharmacy Consult for apixaban Indication: atrial fibrillation  Allergies  Allergen Reactions  . Brilinta [Ticagrelor] Shortness Of Breath and Other (See Comments)    Changed to Plavix due to SOB  . Clindamycin/Lincomycin Anaphylaxis    Whole body skin peeling, blisters  . Doxycycline Anaphylaxis  . Penicillins Rash and Other (See Comments)    Has patient had a PCN reaction causing immediate rash, facial/tongue/throat swelling, SOB or lightheadedness with hypotension:Yes Has patient had a PCN reaction causing severe rash involving mucus membranes or skin necrosis: No Has patient had a PCN reaction that required hospitalization: No Has patient had a PCN reaction occurring within the last 10 years: No If all of the above answers are "NO", then may proceed with Cephalosporin use.  . Sulfa Antibiotics Other (See Comments)    Pt does not remember the reaction, but it sure the allergy exists.    Patient Measurements: Weight: 221 lb 1.9 oz (100.3 kg)   Vital Signs: Temp: 97.9 F (36.6 C) (05/09 1122) Temp Source: Oral (05/09 1122) BP: 111/74 (05/09 1122) Pulse Rate: 58 (05/09 1122)  Labs: Recent Labs    02/19/18 2016 02/20/18 0152 02/21/18 0235 02/22/18 0738  HGB  --   --  9.3*  --   HCT  --   --  31.0*  --   PLT  --   --  256  --   CREATININE 2.38* 2.28* 2.16* 2.13*  TROPONINI 0.06*  --   --   --     Estimated Creatinine Clearance: 30.7 mL/min (A) (by C-G formula based on SCr of 2.13 mg/dL (H)).   Medical History: Past Medical History:  Diagnosis Date  . Allergic reaction caused by a drug 11/2017  . Anemia   . Atrial flutter ([place])    a. s/p RFCA of counterclockwise cavo-tricuspid isthmus dependent atrial flutter 2009 by Dr. Rayann Heman.  Marland Kitchen CAD (coronary artery disease) 06/2014   a. 06/2014: abnl nuc. Cath: Totally occluded LAD with faint collaterals (not a candidate for CTO PCI), 90% ramus s/p DES, 50-70% OM2.  . Chronic  diastolic CHF (congestive heart failure) ([place])    a. 2D Echo 10/23/13: EF 50-55%, basal mid-inf HK, grade 2 DD, mild MR, mod dilated LA, no sig change from prior.  . CKD (chronic kidney disease), stage III ([place])   . Colostomy in place [facility])   . Complication of anesthesia    " during my kidney stone surgery my heart went out of rhythm  . Compression fracture of L1 lumbar vertebra ([name])   . Diabetes ([place])    TYPE 2   . Dyslipidemia   . Dyspnea   . Frequent PVCs   . GERD (gastroesophageal reflux disease)   . History of blood transfusion   . History of kidney stones   . History of peptic ulcer disease   . HTN (hypertension)   . Hx of acute renal failure 01/02/10-3/24-11   due to hypovolemic shock, gastroenteritis and dehydration,hospitalized . Did requre a few days of dialysis. Cr at discharge was 1.8  . Kidney disease   . Kidney stone may 2009   Right hydronephrosis, S/P stone removal  . Lower back pain   . Macular degeneration   . OA (osteoarthritis) of hip   . Obesity   . Osteopenia   . PAF (paroxysmal atrial fibrillation) ([name])    not on long term anticoagulation due to history of heme positive stools and anemia  . Shingles  episode  . Small bowel obstruction, partial ([place]) 2009   Episode  . Ulcerative colitis ([place])    a. s/p total colectomy remotely.    Medications:  Medications Prior to Admission  Medication Sig Dispense Refill Last Dose  . acetaminophen (TYLENOL) 500 MG tablet Take 1 tablet (500 mg total) by mouth every 6 (six) hours as needed (for pain or headaches). Available over the counter   unknown  . Amino Acids-Protein Hydrolys (FEEDING SUPPLEMENT, PRO-STAT SUGAR FREE 64,) LIQD Take 30 mLs by mouth daily.   02/18/2018 at Unknown time  . amiodarone (PACERONE) 200 MG tablet Take 1 tablet (200 mg total) by mouth daily. 90 tablet 3 02/18/2018 at Unknown time  . calcium carbonate (OS-CAL) 600 MG TABS tablet Take 600 mg by mouth daily with breakfast.   02/18/2018 at Unknown  time  . cholecalciferol (VITAMIN D) 1000 units tablet Take 3,000 Units by mouth daily.   02/18/2018 at Unknown time  . docusate sodium (COLACE) 100 MG capsule Take 1 capsule (100 mg total) by mouth 2 (two) times daily. 10 capsule 0 02/18/2018 at Unknown time  . metoprolol succinate (TOPROL-XL) 25 MG 24 hr tablet Take 12.5 mg by mouth daily.   02/18/2018 at 0900  . Multiple Vitamin (MULTIVITAMIN WITH MINERALS) TABS tablet Take 1 tablet by mouth daily.   02/18/2018 at Unknown time  . omeprazole (PRILOSEC) 20 MG capsule Take 20 mg by mouth every evening.   02/18/2018 at Unknown time  . oxyCODONE-acetaminophen (PERCOCET) 10-325 MG tablet Take 1 tablet by mouth every 4 (four) hours as needed for pain. 15 tablet 0 unknown  . pioglitazone (ACTOS) 30 MG tablet Take 1 tablet (30 mg total) by mouth daily.   02/18/2018 at Unknown time  . potassium chloride SA (KLOR-CON M15) 15 MEQ tablet Take 30 mEq by mouth 2 (two) times daily.   02/18/2018 at Unknown time  . promethazine (PHENERGAN) 25 MG tablet Take 25 mg by mouth 3 (three) times daily as needed for nausea or vomiting.   unknown  . ranitidine (ZANTAC) 150 MG tablet Take 150 mg by mouth daily.   02/18/2018 at Unknown time  . saxagliptin HCl (ONGLYZA) 5 MG TABS tablet Take 0.5 tablets (2.5 mg total) by mouth daily. 90 tablet 1 02/18/2018 at Unknown time  . senna-docusate (SENNA S) 8.6-50 MG tablet Take 1 tablet by mouth daily as needed for mild constipation.   unknown  . simvastatin (ZOCOR) 20 MG tablet Take 20 mg by mouth at bedtime.    02/18/2018 at Unknown time  . tamsulosin (FLOMAX) 0.4 MG CAPS capsule Take 1 capsule (0.4 mg total) by mouth daily after supper. 30 capsule 3 02/18/2018 at Unknown time  . UNABLE TO FIND Take 120 mLs by mouth daily. Med Name: Med pass   02/18/2018 at Unknown time  . carvedilol (COREG) 6.25 MG tablet Take 1 tablet (6.25 mg total) by mouth 2 (two) times daily with a meal. (Patient not taking: Reported on 02/19/2018)   Not Taking at Unknown time  .  ferrous sulfate 325 (65 FE) MG tablet Take 1 tablet (325 mg total) by mouth 2 (two) times daily with a meal. (Patient not taking: Reported on 02/19/2018) 30 tablet 3 Not Taking at Unknown time    Assessment: 84yoM Patient has not been on anticoagulation in the past due to history of GIB. However, patient in atrial fibrillation with RVR despite amiodarone. Planning for DCCV tomorrow. Was getting heparin subQ for VTE prophylaxis, last dose 0537. CBC  stable on 5/8 labs. SCr 2.13.   Goal of Therapy:  Anticoagulation Monitor platelets by anticoagulation protocol: Yes   Plan:  Apixaban 2.5 mg PO BID Monitor for signs and symptoms of bleeding   Thank you for allowing [place] to participate in this patients care.  Jens Som, PharmD Clinical phone for 02/22/2018 from 7a-3:30p: [phone] If after 3:30p, please call main pharmacy at: [phone] 02/22/2018 3:00 PM

## 2018-02-22 NOTE — Progress Notes (Addendum)
PROGRESS NOTE    [name] [name]  WNI:[id number] DOB: Nov 03, 1932 DOA: 02/19/2018 PCP: [name], MD   Brief Narrative:84 y.o.malewith medical history significant ofhypertension, hyperlipidemia, diabetes mellitus, GERD, atrial fibrillation not on anticoagulants, CAD, stent placement, CHF with EF of 40%, CKD-3, colostomy, peptic ulcer disease, iron deficiency anemia, who presents with shortness of breath.  Patient states that she started having shortness of breath at about 1030PM, which has been progressively getting worse.Hedoes not have cough, chest pain, fever or chills. She has nausea, and vomited twice, but no abdominal pain or diarrhea. Patient denies symptoms of UTI. Patient states that he has a Foley catheter placed 2 weeks ago after UTI.He has bilateral leg edema. No unilateral weakness. Patient is alert and oriented x3. Per report pt is taking 90 mEq of KCl daily at [facility]  ED Course:pt was found to havetroponin 0.06, potassium 7.5, stable renal function, temperature normal, bradycardia, hypotensive with blood pressure 83/75, which improved to 117/43, tachypnea, oxygen saturation 74% which improved to 100% on room air, temperature normal. Chest x-ray showed pulmonary edema and a bilateral small pleural effusion. Patient is admitted to stepdown as inpatient. Cardiology fellow, ?Dr. Raiford Simmonds was consulted.    Assessment & Plan:   Principal Problem:   Hyperkalemia Active Problems:   CAD in native artery- total LAD, s/p RI DES 06/19/14   Dyslipidemia   CKD (chronic kidney disease), stage III ([name])   Acute on chronic combined systolic and diastolic CHF (congestive heart failure) ([name])   Paroxysmal atrial fibrillation ([name])   Bradycardia   Elevated troponin   GERD (gastroesophageal reflux disease)   Type II diabetes mellitus with renal manifestations ([name])   HTN (hypertension)   Pressure injury of skin   Acute on chronic diastolic CHF (congestive heart failure) ([name])  Atrial fibrillation with RVR ([name])  1] hyperkalemia secondary to increased potassium intake.  Potassium has been normalized after multiple treatments with kayexalate and Veltassa.  Patient developed junctional rhythm and bradycardia and hypotension which has been resolved at this time but however he he is in A. fib and RVR in spite of being on amiodarone.  Plans by Dr. Radford Pax noted that she is going to consult with GI to see if this patient is a candidate for anticoagulation.  If  he is cardiology planning to do DCCV tomorrow.  Patient will be started on Eliquis if okay with GI today.  2] acute on chronic combined systolic and diastolic CHF on Lasix 40 twice daily.   3] CKD improving.  4] left ankle trimalleolar fracture followed by Dr. Sharol Given.  Okay to walk with the boots once he is medically stable.  5] urine retention Foley catheter was removed yesterday and a condom cath was placed and he is able to urinate.  6] type 2 diabetes currently on sliding scale insulin.  Restart Actos and saxagliptin upon discharge.   DVT prophylaxis  Heparin : Code Status: dnr Family Communication:none Disposition Plan: tbd  Consultants:cards,gi  Procedures:none Antimicrobials:none Subjective:does not feel palpitations,denies chest pain  Objective: Vitals:   02/21/18 2349 02/22/18 0617 02/22/18 0738 02/22/18 1122  BP: (!) 100/46 107/64 96/65 111/74  Pulse: (!) 120 79 97 (!) 58  Resp: 15 13 18 18   Temp: 98 F (36.7 C) 97.9 F (36.6 C) 98 F (36.7 C) 97.9 F (36.6 C)  TempSrc: Oral Oral Oral Oral  SpO2: 99% 100% 97% 98%  Weight:  100.3 kg (221 lb 1.9 oz)      Intake/Output Summary (Last 24 hours)  at 02/22/2018 1302 Last data filed at 02/22/2018 1250 Gross per 24 hour  Intake 823 ml  Output 2325 ml  Net -1502 ml   Filed Weights   02/20/18 0530 02/21/18 0451 02/22/18 0617  Weight: 106.1 kg (233 lb 14.5 oz) 103.7 kg (228 lb 9.9 oz) 100.3 kg (221 lb 1.9 oz)    Examination:  General exam:  Appears calm and comfortable  Respiratory system: decreased breath sounds auscultation. Respiratory effort normal. Cardiovascular system: S1 & S2 heard, RRR. No JVD, murmurs, rubs, gallops or clicks. No pedal edema. Gastrointestinal system: Abdomen is nondistended, soft and nontender. No organomegaly or masses felt. Normal bowel sounds heard. Central nervous system: Alert and oriented. No focal neurological deficits. Extremities:2plus edemaSkin: No rashes, lesions or ulcers Psychiatry: Judgement and insight appear normal. Mood & affect appropriate.     Data Reviewed: I have personally reviewed following labs and imaging studies  CBC: Recent Labs  Lab 02/19/18 0347 02/19/18 0549 02/21/18 0235  WBC 11.4*  --  7.3  NEUTROABS 8.4*  --  4.8  HGB 9.9* 8.5* 9.3*  HCT 31.8* 25.0* 31.0*  MCV 93.3  --  94.8  PLT 335  --  017   Basic Metabolic Panel: Recent Labs  Lab 02/19/18 1240 02/19/18 2016 02/20/18 0152  02/21/18 0235  02/21/18 1402 02/21/18 1906 02/21/18 2337 02/22/18 0249 02/22/18 0738  NA 136 136 134*  --  136  --   --   --   --   --  134*  K 6.1* 6.4* 5.5*  5.5*   < > 4.5  4.5   < > 4.4 5.1 4.5 4.6 4.9  CL 101 100* 98*  --  97*  --   --   --   --   --  94*  CO2 24 26 28   --  32  --   --   --   --   --  31  GLUCOSE 109* 117* 265*  --  156*  --   --   --   --   --  143*  BUN 52* 57* 57*  --  51*  --   --   --   --   --  57*  CREATININE 1.82* 2.38* 2.28*  --  2.16*  --   --   --   --   --  2.13*  CALCIUM 8.8* 8.2* 7.6*  --  7.7*  --   --   --   --   --  7.7*   < > = values in this interval not displayed.   GFR: Estimated Creatinine Clearance: 30.7 mL/min (A) (by C-G formula based on SCr of 2.13 mg/dL (H)). Liver Function Tests: Recent Labs  Lab 02/19/18 0347  AST 37  ALT 24  ALKPHOS 216*  BILITOT 0.7  PROT 6.4*  ALBUMIN 2.1*   No results for input(s): LIPASE, AMYLASE in the last 168 hours. No results for input(s): AMMONIA in the last 168 hours. Coagulation  Profile: No results for input(s): INR, PROTIME in the last 168 hours. Cardiac Enzymes: Recent Labs  Lab 02/19/18 0347 02/19/18 1240 02/19/18 2016  TROPONINI 0.06* 0.05* 0.06*   BNP (last 3 results) No results for input(s): PROBNP in the last 8760 hours. HbA1C: Recent Labs    02/20/18 0152  HGBA1C 7.5*   CBG: Recent Labs  Lab 02/21/18 1056 02/21/18 1611 02/21/18 2144 02/22/18 0735 02/22/18 1124  GLUCAP 277* 88 159* 143* 148*   Lipid Profile: Recent Labs  02/20/18 0152  CHOL 97  HDL 36*  LDLCALC 44  TRIG 85  CHOLHDL 2.7   Thyroid Function Tests: No results for input(s): TSH, T4TOTAL, FREET4, T3FREE, THYROIDAB in the last 72 hours. Anemia Panel: No results for input(s): VITAMINB12, FOLATE, FERRITIN, TIBC, IRON, RETICCTPCT in the last 72 hours. Sepsis Labs: No results for input(s): PROCALCITON, LATICACIDVEN in the last 168 hours.  Recent Results (from the past 240 hour(s))  MRSA PCR Screening     Status: None   Collection Time: 02/19/18 11:20 PM  Result Value Ref Range Status   MRSA by PCR NEGATIVE NEGATIVE Final    Comment:        The GeneXpert MRSA Assay (FDA approved for NASAL specimens only), is one component of a comprehensive MRSA colonization surveillance program. It is not intended to diagnose MRSA infection nor to guide or monitor treatment for MRSA infections. Performed at [facility], [place] [address], [place], [place] [address]          Radiology Studies: No results found.      Scheduled Meds: . albuterol  10 mg Nebulization Once  . amiodarone  200 mg Oral BID  . aspirin EC  81 mg Oral Daily  . calcium carbonate  1 tablet Oral Q breakfast  . cholecalciferol  3,000 Units Oral Daily  . docusate sodium  100 mg Oral BID  . famotidine  20 mg Oral QHS  . feeding supplement (PRO-STAT SUGAR FREE 64)  30 mL Oral Daily  . furosemide  40 mg Oral Daily  . heparin injection (subcutaneous)  5,000 Units Subcutaneous Q8H  . insulin  aspart  0-5 Units Subcutaneous QHS  . insulin aspart  0-9 Units Subcutaneous TID WC  . multivitamin with minerals  1 tablet Oral Daily  . pantoprazole  40 mg Oral Daily  . patiromer  8.4 g Oral Daily  . simvastatin  20 mg Oral QHS  . sodium chloride flush  3 mL Intravenous Q12H  . sodium chloride flush  3 mL Intravenous Q12H  . tamsulosin  0.4 mg Oral QPC supper   Continuous Infusions: . sodium chloride    . sodium chloride       LOS: 3 days     Georgette Shell, MD If 7PM-7AM, please contact night-coverage [email or web address] Password [email or web address] 02/22/2018, 1:02 PM

## 2018-02-22 NOTE — Consult Note (Addendum)
[facility] Consult: 11:12 AM 02/22/2018  LOS: 3 days    Referring Provider: Dr Radford Pax  Primary Care Physician:  [name], MD Primary Gastroenterologist:  Dr. Henrene Pastor    Reason for Consultation:  ? Ok to start Eliquis.     HPI: [name] [name] is a 84 y.o. male.  Obese diabetic.  Hx ulcerative colitis.  S/p subtotal colectomy 1985, further and complete resection, ileostomy and closure of rectal stump 1992.  CAD.  DES place 2015.  Tegretol has been discontinued for many years.  Parox A fib; deemed a non-candidate for anticoagulation due to his history of anemia and blood loss.  Diastolic dysfx.  Pulmonary htn.  CKD 3.  Left ankle surgery 05/4131 with complications, required hardware removal 11/2017.  Probable OSA.     for transfusion requiring FOBT + anemia.  Study normal, no cause for FOBT +, anemia.  Dr Carlean Purl suspected multifactorial anemia  11/2014 EGD for FOBT +, IDA.  Dr Henrene Pastor.  Study noted multiple gastric erosions as likely source.  Clo test negative.  Patient was to continue on daily PPI indefinitely and oral iron supplementation.   Treated by urology about 2 weeks ago for UTI.  Patient seen a week ago by the urologist and started on high doses of oral potassium. Over the past weekend he developed acute on chronic dyspnea which progressed, eventually EMS called and transported to ED on 5/7.  He was hyperkalemic, K >7.5.  Elevated troponins attributed to demand ischemia.  Initial EKG showing junctional bradycardia, rate in the 30s to 40s but rhythm eventually progressed to atrial fibrillation.  Echocardiogram with LVEF 50 to 44%, grade 1 diastolic dysfunction.  Noncritical aortic and mitral valve calcification, mild elevation pulmonary arterial pressures. As of this morning he remains in atrial fibrillation  with RVR despite IV and p.o. Amiodarone.  Treatment is hampered by soft blood pressure, limiting use of beta-blocker.  Dr. Radford Pax realizes that he is not an ideal DOAC candidate given his history of anemia and gastric erosions but she would like to give Eliquis a trial.   He has chronic anemia.  His Hgb generally ranges 9-10.  However Hgb was as high as 11.6 in late 11/2017 but that was associated with acute on chronic renal failure and possibly dehydration.  MCV consistently in 90s.   Patient takes ranitidine 150 mg daily and omeprazole 20 mg daily at HS.  He has no issues with his appetite, heartburn, dysphagia, abdominal discomfort.  Stools to the ostomy continued to be brown.  He has not seen any bloody or melenic stool.   Past Medical History:  Diagnosis Date  . Allergic reaction caused by a drug 11/2017  . Anemia   . Atrial flutter ([place])    a. s/p RFCA of counterclockwise cavo-tricuspid isthmus dependent atrial flutter 2009 by Dr. Rayann Heman.  Marland Kitchen CAD (coronary artery disease) 06/2014   a. 06/2014: abnl nuc. Cath: Totally occluded LAD with faint collaterals (not a candidate for CTO PCI), 90% ramus s/p DES, 50-70% OM2.  . Chronic diastolic CHF (congestive heart  failure) ([place])    a. 2D Echo 10/23/13: EF 50-55%, basal mid-inf HK, grade 2 DD, mild MR, mod dilated LA, no sig change from prior.  . CKD (chronic kidney disease), stage III ([place])   . Colostomy in place [facility])   . Complication of anesthesia    " during my kidney stone surgery my heart went out of rhythm  . Compression fracture of L1 lumbar vertebra ([name])   . Diabetes ([place])    TYPE 2   . Dyslipidemia   . Dyspnea   . Frequent PVCs   . GERD (gastroesophageal reflux disease)   . History of blood transfusion   . History of kidney stones   . History of peptic ulcer disease   . HTN (hypertension)   . Hx of acute renal failure 01/02/10-3/24-11   due to hypovolemic shock, gastroenteritis and dehydration,hospitalized . Did requre a few days of  dialysis. Cr at discharge was 1.8  . Kidney disease   . Kidney stone may 2009   Right hydronephrosis, S/P stone removal  . Lower back pain   . Macular degeneration   . OA (osteoarthritis) of hip   . Obesity   . Osteopenia   . PAF (paroxysmal atrial fibrillation) ([name])    not on long term anticoagulation due to history of heme positive stools and anemia  . Shingles    episode  . Small bowel obstruction, partial ([place]) 2009   Episode  . Ulcerative colitis ([place])    a. s/p total colectomy remotely.    Past Surgical History:  Procedure Laterality Date  . BACK SURGERY    . CARDIAC CATHETERIZATION  06/19/2014  . COLON RESECTION    . COLOSTOMY    . CORONARY ANGIOPLASTY    . CORONARY STENT PLACEMENT  06/19/2014   DES       dr Martinique  . HARDWARE REMOVAL Left 12/01/2017   Procedure: HARDWARE REMOVAL LEFT ANKLE;  Surgeon: Newt Minion, MD;  Location: [place];  Service: Orthopedics;  Laterality: Left;  . HERNIA REPAIR    . LEFT AND RIGHT HEART CATHETERIZATION WITH CORONARY ANGIOGRAM N/A 06/19/2014   Procedure: LEFT AND RIGHT HEART CATHETERIZATION WITH CORONARY ANGIOGRAM;  Surgeon: Peter M Martinique, MD;  Location: [facility] CATH LAB;  Service: Cardiovascular;  Laterality: N/A;  . ORIF ANKLE FRACTURE Left 10/25/2017   Procedure: OPEN REDUCTION INTERNAL FIXATION (ORIF) LEFT ANKLE FRACTURE;  Surgeon: Newt Minion, MD;  Location: [place];  Service: Orthopedics;  Laterality: Left;  . TONSILLECTOMY      Prior to Admission medications   Medication Sig Start Date End Date Taking? Authorizing Provider  acetaminophen (TYLENOL) 500 MG tablet Take 1 tablet (500 mg total) by mouth every 6 (six) hours as needed (for pain or headaches). Available over the counter 01/08/17  Yes Hongalgi, Lenis Dickinson, MD  Amino Acids-Protein Hydrolys (FEEDING SUPPLEMENT, PRO-STAT SUGAR FREE 64,) LIQD Take 30 mLs by mouth daily.   Yes [provider]  amiodarone (PACERONE) 200 MG tablet Take 1 tablet (200 mg total) by mouth daily.  07/03/17  Yes Turner, Eber Hong, MD  calcium carbonate (OS-CAL) 600 MG TABS tablet Take 600 mg by mouth daily with breakfast.   Yes [provider]  cholecalciferol (VITAMIN D) 1000 units tablet Take 3,000 Units by mouth daily.   Yes [provider]  docusate sodium (COLACE) 100 MG capsule Take 1 capsule (100 mg total) by mouth 2 (two) times daily. 10/27/17  Yes Doreatha Lew, MD  metoprolol  succinate (TOPROL-XL) 25 MG 24 hr tablet Take 12.5 mg by mouth daily.   Yes [provider]  Multiple Vitamin (MULTIVITAMIN WITH MINERALS) TABS tablet Take 1 tablet by mouth daily.   Yes [provider]  omeprazole (PRILOSEC) 20 MG capsule Take 20 mg by mouth every evening.   Yes [provider]  oxyCODONE-acetaminophen (PERCOCET) 10-325 MG tablet Take 1 tablet by mouth every 4 (four) hours as needed for pain. 10/27/17  Yes Patrecia Pour, Christean Grief, MD  pioglitazone (ACTOS) 30 MG tablet Take 1 tablet (30 mg total) by mouth daily. 11/05/14  Yes Dunn, Dayna N, PA-C  potassium chloride SA (KLOR-CON M15) 15 MEQ tablet Take 30 mEq by mouth 2 (two) times daily.   Yes [provider]  promethazine (PHENERGAN) 25 MG tablet Take 25 mg by mouth 3 (three) times daily as needed for nausea or vomiting.   Yes [provider]  ranitidine (ZANTAC) 150 MG tablet Take 150 mg by mouth daily.   Yes [provider]  saxagliptin HCl (ONGLYZA) 5 MG TABS tablet Take 0.5 tablets (2.5 mg total) by mouth daily. 12/13/17  Yes Velvet Bathe, MD  senna-docusate (SENNA S) 8.6-50 MG tablet Take 1 tablet by mouth daily as needed for mild constipation.   Yes [provider]  simvastatin (ZOCOR) 20 MG tablet Take 20 mg by mouth at bedtime.    Yes [provider]  tamsulosin (FLOMAX) 0.4 MG CAPS capsule Take 1 capsule (0.4 mg total) by mouth daily after supper. 10/27/14  Yes Florencia Reasons, MD  UNABLE TO FIND Take 120 mLs by mouth daily. Med Name: Med pass   Yes [provider]  carvedilol (COREG) 6.25 MG tablet Take 1 tablet (6.25 mg total) by mouth 2 (two) times daily with a meal. Patient not taking: Reported on 02/19/2018 12/13/17   Velvet Bathe, MD  ferrous sulfate 325 (65 FE) MG tablet Take 1 tablet (325 mg total) by mouth 2 (two) times daily with a meal. Patient not taking: Reported on 02/19/2018 10/27/14   Florencia Reasons, MD    Scheduled Meds: . albuterol  10 mg Nebulization Once  . amiodarone  200 mg Oral BID  . aspirin EC  81 mg Oral Daily  . calcium carbonate  1 tablet Oral Q breakfast  . cholecalciferol  3,000 Units Oral Daily  . docusate sodium  100 mg Oral BID  . famotidine  20 mg Oral QHS  . feeding supplement (PRO-STAT SUGAR FREE 64)  30 mL Oral Daily  . furosemide  40 mg Oral Daily  . heparin injection (subcutaneous)  5,000 Units Subcutaneous Q8H  . insulin aspart  0-5 Units Subcutaneous QHS  . insulin aspart  0-9 Units Subcutaneous TID WC  . multivitamin with minerals  1 tablet Oral Daily  . pantoprazole  40 mg Oral Daily  . patiromer  8.4 g Oral Daily  . simvastatin  20 mg Oral QHS  . sodium chloride flush  3 mL Intravenous Q12H  . sodium chloride flush  3 mL Intravenous Q12H  . tamsulosin  0.4 mg Oral QPC supper   Infusions: . sodium chloride    . sodium chloride     PRN Meds: sodium chloride, acetaminophen, ALPRAZolam, atropine, hydrALAZINE, hydrocortisone cream, ondansetron (ZOFRAN) IV, oxyCODONE-acetaminophen **AND** oxyCODONE, senna-docusate, sodium chloride flush, sodium chloride flush   Allergies as of 02/19/2018 - Review Complete 02/19/2018  Allergen Reaction Noted  . Brilinta [ticagrelor] Shortness Of Breath and Other (See Comments) 11/05/2014  . Clindamycin/lincomycin  Anaphylaxis 12/11/2017  . Doxycycline Anaphylaxis 12/05/2017  . Penicillins Rash and Other (See Comments) 10/02/2013  . Sulfa antibiotics Other (See Comments) 10/02/2013    Family History  Problem Relation Age of Onset  . Colon cancer Mother      Social History   Socioeconomic History  . Marital status: Widowed    Spouse name: Not on file  . Number of children: Not on file  . Years of education: Not on file  . Highest education level: Not on file  Occupational History  . Not on file  Social Needs  . Financial resource strain: Not on file  . Food insecurity:    Worry: Not on file    Inability: Not on file  . Transportation needs:    Medical: Not on file    Non-medical: Not on file  Tobacco Use  . Smoking status: Never Smoker  . Smokeless tobacco: Never Used  Substance and Sexual Activity  . Alcohol use: No  . Drug use: No  . Sexual activity: Not on file  Lifestyle  . Physical activity:    Days per week: Not on file    Minutes per session: Not on file  . Stress: Not on file  Relationships  . Social connections:    Talks on phone: Not on file    Gets together: Not on file    Attends religious service: Not on file    Active member of club or organization: Not on file    Attends meetings of clubs or organizations: Not on file    Relationship status: Not on file  . Intimate partner violence:    Fear of current or ex partner: Not on file    Emotionally abused: Not on file    Physically abused: Not on file    Forced sexual activity: Not on file  Other Topics Concern  . Not on file  Social History Narrative  . Not on file    REVIEW OF SYSTEMS: Constitutional: Weakness has improved during this admission. ENT:  No nose bleeds Pulm: Dyspnea has improved. CV: Patient did not notice any irregular heartbeat or palpitations.  No lower extremity swelling. GU:  No hematuria, no frequency GI:  Per HPI Heme: Denies unusual or excessive bleeding or bruising. Transfusions: None Neuro:  No headaches, no peripheral tingling or numbness Derm:  No itching, no rash or sores.  Endocrine:  No sweats or chills.  No polyuria or dysuria Immunization: Did not inquire as to recent immunizations. Travel:  None beyond local  counties in last few months.    PHYSICAL EXAM: Vital signs in last 24 hours: Vitals:   02/22/18 0617 02/22/18 0738  BP: 107/64 96/65  Pulse: 79 97  Resp: 13 18  Temp: 97.9 F (36.6 C) 98 F (36.7 C)  SpO2: 100% 97%   Wt Readings from Last 3 Encounters:  02/22/18 221 lb 1.9 oz (100.3 kg)  01/04/18 225 lb (102.1 kg)  12/21/17 225 lb (102.1 kg)    General: Obese, alert, moderately chronically ill appearing Head: No facial asymmetry or swelling.  No signs of head trauma. Eyes: No conjunctival pallor.  No scleral icterus.  EOMI. Ears: HOH Nose: No discharge or congestion. Mouth: Oral MM moist, pink, clear.  Tongue midline. Neck: No JVD, no masses, no thyromegaly. Lungs: Clear bilaterally.  No cough or labored breathing. Heart: Irregularly irregular, rate ~ 110.  No MRG.  S1, S2 present. Abdomen: Obese, soft.  No organomegaly, masses, bruits, hernias.  Bowel sounds  active.. Rectal: Deferred Musc/Skeltl: Erythematous but healed and noninfected scar on left lateral malleolus Extremities: No CCE.  Some changes of venous insufficiency dermatitis on the lower leg Neurologic: Alert.  Somewhat hard of hearing.  Oriented x3.  Good historian.  Moves all 4 limbs, strength not tested.  No tremors or gross deficits. Skin: No rashes or sores Tattoos: None observed Nodes: No cervical adenopathy Psych: Cooperative.  Slightly anxious but overall calm.  Intake/Output from previous day: 05/08 0701 - 05/09 0700 In: 763 [P.O.:660; I.V.:103] Out: 2675 [Urine:1900; Stool:775] Intake/Output this shift: Total I/O In: 240 [P.O.:240] Out: 350 [Stool:350]  LAB RESULTS: Recent Labs    02/21/18 0235  WBC 7.3  HGB 9.3*  HCT 31.0*  PLT 256   BMET Lab Results  Component Value Date   NA 134 (L) 02/22/2018   NA 136 02/21/2018   NA 134 (L) 02/20/2018   K 4.9 02/22/2018   K 4.6 02/22/2018   K 4.5 02/21/2018   CL 94 (L) 02/22/2018   CL 97 (L) 02/21/2018   CL 98 (L) 02/20/2018   CO2 31  02/22/2018   CO2 32 02/21/2018   CO2 28 02/20/2018   GLUCOSE 143 (H) 02/22/2018   GLUCOSE 156 (H) 02/21/2018   GLUCOSE 265 (H) 02/20/2018   BUN 57 (H) 02/22/2018   BUN 51 (H) 02/21/2018   BUN 57 (H) 02/20/2018   CREATININE 2.13 (H) 02/22/2018   CREATININE 2.16 (H) 02/21/2018   CREATININE 2.28 (H) 02/20/2018   CALCIUM 7.7 (L) 02/22/2018   CALCIUM 7.7 (L) 02/21/2018   CALCIUM 7.6 (L) 02/20/2018   LFT No results for input(s): PROT, ALBUMIN, AST, ALT, ALKPHOS, BILITOT, BILIDIR, IBILI in the last 72 hours. PT/INR Lab Results  Component Value Date   INR 1.07 01/04/2017   INR 1.29 10/23/2014   INR 1.19 10/22/2014   Hepatitis Panel No results for input(s): HEPBSAG, HCVAB, HEPAIGM, HEPBIGM in the last 72 hours. C-Diff No components found for: CDIFF Lipase  No results found for: LIPASE  Drugs of Abuse     Component Value Date/Time   LABOPIA NONE DETECTED 01/04/2017 1229   COCAINSCRNUR NONE DETECTED 01/04/2017 1229   LABBENZ NONE DETECTED 01/04/2017 1229   AMPHETMU NONE DETECTED 01/04/2017 1229   THCU NONE DETECTED 01/04/2017 1229   LABBARB NONE DETECTED 01/04/2017 1229     RADIOLOGY STUDIES: No results found.   IMPRESSION:   *   Chronic anemia.  Currently normocytic.  Deemed to be multifactorial in past years.  Previous contribution of gastric erosions and associated blood loss when he was taking Plavix in 2016. Current outpatient acid suppression therapy is with omeprazole and ranitidine.  No worrisome symptoms to suggest ongoing gastritis.  *    New onset rapid A. fib.  Dr. Theodosia Blender plan is for TEE/DCCV tomorrow afternoon 5/10.    *    Chronic anemia.  Normocytic.  Multifactorial including CKD.   On chronic oral iron.  *    History FOBT+.  Gastric erosions on 2016 EGD  *   S/P subtotal colectomy, ileostomy for UC.   *  Incidental elevation of Alk Phos, otherwise normal LFTs.  This may be from his complicated, healing left ankle fx.       PLAN:     *   Case  d/w Dr. Silverio Decamp.  Faythe Ghee to initiate Eliquis.  *    Will need to check his Hgb more frequently than normal.  Probably every 2 weeks for a while and then every  several weeks if his anemia is stable.  *   GGT in AM to sort out source of elevated alk phos (bone vs liver).   Azucena Freed  02/22/2018, 11:12 AM Phone [phone]   Attending physician's note   I have taken a history, examined the patient and reviewed the chart. I agree with the Advanced Practitioner's note, impression and recommendations. 84 year old male with history of ulcerative colitis status post colectomy with ileostomy and gastritis.  A. fib with RVR and CHF, okay to initiate Eliquis and continue as patient does not have any high risk lesions for possible recurrent GI bleed. Continue PPI daily Avoid NSAIDs Will sign off, available if have any questions  K. Denzil Magnuson , MD [phone]

## 2018-02-22 NOTE — Progress Notes (Addendum)
Progress Note  Patient Name: [name] [name] Date of Encounter: 02/22/2018  Primary Cardiologist: [name], MD   Subjective   Patient remains in atrial fibrillation with RVR this morning.  This is despite IV amiodarone yesterday as well as p.o. amnio.  Denies any chest pain or shortness of breath.  Inpatient Medications    Scheduled Meds: . albuterol  10 mg Nebulization Once  . amiodarone  200 mg Oral BID  . aspirin EC  81 mg Oral Daily  . calcium carbonate  1 tablet Oral Q breakfast  . cholecalciferol  3,000 Units Oral Daily  . docusate sodium  100 mg Oral BID  . famotidine  20 mg Oral QHS  . feeding supplement (PRO-STAT SUGAR FREE 64)  30 mL Oral Daily  . furosemide  40 mg Intravenous BID  . heparin injection (subcutaneous)  5,000 Units Subcutaneous Q8H  . insulin aspart  0-5 Units Subcutaneous QHS  . insulin aspart  0-9 Units Subcutaneous TID WC  . multivitamin with minerals  1 tablet Oral Daily  . pantoprazole  40 mg Oral Daily  . patiromer  8.4 g Oral Daily  . simvastatin  20 mg Oral QHS  . sodium chloride flush  3 mL Intravenous Q12H  . tamsulosin  0.4 mg Oral QPC supper   Continuous Infusions: . sodium chloride     PRN Meds: sodium chloride, acetaminophen, ALPRAZolam, atropine, hydrALAZINE, ondansetron (ZOFRAN) IV, oxyCODONE-acetaminophen **AND** oxyCODONE, senna-docusate, sodium chloride flush   Vital Signs    Vitals:   02/21/18 2250 02/21/18 2349 02/22/18 0617 02/22/18 0738  BP: 105/63 (!) 100/46 107/64 96/65  Pulse:  (!) 120 79 97  Resp:  15 13 18   Temp:  98 F (36.7 C) 97.9 F (36.6 C) 98 F (36.7 C)  TempSrc:  Oral Oral Oral  SpO2:  99% 100% 97%  Weight:   221 lb 1.9 oz (100.3 kg)     Intake/Output Summary (Last 24 hours) at 02/22/2018 0932 Last data filed at 02/22/2018 0836 Gross per 24 hour  Intake 643 ml  Output 2175 ml  Net -1532 ml   Filed Weights   02/20/18 0530 02/21/18 0451 02/22/18 0617  Weight: 233 lb 14.5 oz (106.1 kg) 228 lb 9.9  oz (103.7 kg) 221 lb 1.9 oz (100.3 kg)    Telemetry    Atrial fibrillation with RVR.- Personally Reviewed  ECG    No new EKG to review.- Personally Reviewed  Physical Exam   GEN: No acute distress.   Neck: No JVD Cardiac:  Irregularly irregular and tachycardic, no murmurs, rubs, or gallops.  Respiratory: Clear to auscultation bilaterally. GI: Soft, nontender, non-distended  MS: No edema; No deformity. Neuro:  Nonfocal  Psych: Normal affect   Labs    Chemistry Recent Labs  Lab 02/19/18 0347  02/20/18 0152  02/21/18 0235  02/21/18 2337 02/22/18 0249 02/22/18 0738  NA 131*   < > 134*  --  136  --   --   --  134*  K >7.5*   < > 5.5*  5.5*   < > 4.5  4.5   < > 4.5 4.6 4.9  CL 101   < > 98*  --  97*  --   --   --  94*  CO2 22   < > 28  --  32  --   --   --  31  GLUCOSE 173*   < > 265*  --  156*  --   --   --  143*  BUN 54*   < > 57*  --  51*  --   --   --  57*  CREATININE 1.77*   < > 2.28*  --  2.16*  --   --   --  2.13*  CALCIUM 8.2*   < > 7.6*  --  7.7*  --   --   --  7.7*  PROT 6.4*  --   --   --   --   --   --   --   --   ALBUMIN 2.1*  --   --   --   --   --   --   --   --   AST 37  --   --   --   --   --   --   --   --   ALT 24  --   --   --   --   --   --   --   --   ALKPHOS 216*  --   --   --   --   --   --   --   --   BILITOT 0.7  --   --   --   --   --   --   --   --   GFRNONAA 34*   < > 25*  --  27*  --   --   --  27*  GFRAA 39*   < > 29*  --  31*  --   --   --  32*  ANIONGAP 8   < > 8  --  7  --   --   --  9   < > = values in this interval not displayed.     Hematology Recent Labs  Lab 02/19/18 0347 02/19/18 0549 02/21/18 0235  WBC 11.4*  --  7.3  RBC 3.41*  --  3.27*  HGB 9.9* 8.5* 9.3*  HCT 31.8* 25.0* 31.0*  MCV 93.3  --  94.8  MCH 29.0  --  28.4  MCHC 31.1  --  30.0  RDW 16.6*  --  16.6*  PLT 335  --  256    Cardiac Enzymes Recent Labs  Lab 02/19/18 0347 02/19/18 1240 02/19/18 2016  TROPONINI 0.06* 0.05* 0.06*   No results for  input(s): TROPIPOC in the last 168 hours.   BNP Recent Labs  Lab 02/19/18 0347  BNP 488.1*     DDimer No results for input(s): DDIMER in the last 168 hours.   Radiology    No results found.  Cardiac Studies   Echo 02/19/18: Study Conclusions - Left ventricle: The cavity size was normal. There was mild focal basal hypertrophy of the septum. Systolic function was normal. The estimated ejection fraction was in the range of 50% to 55%. Wall motion was normal; there were no regional wall motion abnormalities. Doppler parameters are consistent with abnormal left ventricular relaxation (grade 1 diastolic dysfunction). - Aortic valve: Trileaflet; mildly thickened, mildly calcified leaflets. - Mitral valve: Calcified annulus. - Pulmonary arteries: Systolic pressure was mildly increased. PA peak pressure: 31 mm Hg (S).   Patient Profile     84 y.o. male with PMH of PAF, CAD with CTO of LAD with collaterals and PCI/DES to large ramus, chronic diastolic HF, CKD, DM who presented with shortness of breath and found to have severe hyperkalemia.  Assessment & Plan    1. Hyperkalemia -Due to iatrogenic  intake of extra potassium prescribed by Urology in setting of UTI and foley cath -Apparently was started on potassium supplementation 3 times a day by his urologist -K+ pain stable at 4.9 today.  2. Acute on chronic diastolic CHF -Likely exacerbated by bradycardia arrhythmias -BNP elevated at 488 and chest x-ray with CHF -2D echocardiogram today showed EF 50 to 55% with no wall motion normalities. There was grade 1 diastolic dysfunction. -he put out 0.67 L yesterday and is net neg 2.5 L -weight is down 12lbs from admit.  His baseline weight from last discharge several months ago was to 25 pounds and he is to 21 pounds today -Creatinine trending down (1.82>2.38>2.28>2.16>2.13).His documented baseline recently has been 1.9-2. 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 This is improving with diuresis. -I think he is euvolemic at this time and weight is back to baseline so  will change him to Lasix 40 mg p.o. Daily -Follow renal function closely  3. Elevated troponin -Troponin has been 0.06> 0.05> 0.06 and consistent with demand ischemia in the setting of bradycardia and known underlying CAD with CTO of LAD as well as CHF . -2D echocardiogram showed low normal LV function with no regional wall motion abnormalities -His anginal equivalent in the past has been significant shortness of breath which he says he has not had recently until Sunday in the setting of significant bradycardia -No further ischemic work-up at this time.  5. PAF -Bradycardia has resolved but unfortunately is in afib with RVR due to holding Amio and BB admission for bradycardia. -He was given amiodarone bolus of 150 mg IV yesterday and restarted on amiodarone 200 mg twice daily as well as Toprol-XL 25 mg daily but continues to be in A. fib with RVR. -He is not on DOAC therapy due to history of GI bleed with gastric erosions.  -Cannot increase beta-blocker further due to soft blood pressure. -I am going to consult with GI to determine if we can possibly start him on DOAC therapy.  He has seen Dr. Henrene Pastor in the past. -If GI ok with starting DOAC then will start Eliquis 2.56m BID and make NPO after MN for TEE/DCCV as an afternoon case tomorrow.  5. CAD -Prior cath showed CTO of LAD with collaterals, 80% ramus status post PCI with DES and 50 to 70% OM 2. Not a candidate for CTO of LAD. -He denies any chest pain and again shortness of breath is only worsened in the setting of bradycardia and prior to that he has been doing well. -trop minimally elevated with flat trend c/w demand ischemia in the setting of severe bradycardia, worsening renal function, hypotension and hyperkalemia -No  further ischemic work-up at this time. -Continue aspirin 81 mg daily, blocker and statin  6.  Wide-complex tachycardia -Appears that this may be VT but cannot rule out aberration in setting of A. fib with RVR -2D echocardiogram showed low normal LV function EF 50 to 55% -Potassium is in normal limits -Check mag level  I have spent a total of 35 minutes with patient reviewing echo, prior GI procedures , telemetry, EKGs, labs and examining patient as well as establishing an assessment and plan that was discussed with the patient.  > 50% of time was spent in direct patient care.     For questions or updates, please contact [place] consult [email or web address] for contact info under Cardiology/STEMI.  Signed, [name], MD  02/22/2018, 9:32 AM

## 2018-02-23 ENCOUNTER — Encounter (HOSPITAL_COMMUNITY): Payer: Self-pay | Admitting: Certified Registered Nurse Anesthetist

## 2018-02-23 ENCOUNTER — Inpatient Hospital Stay (HOSPITAL_COMMUNITY): Payer: Medicare Other | Admitting: Certified Registered Nurse Anesthetist

## 2018-02-23 ENCOUNTER — Inpatient Hospital Stay (HOSPITAL_COMMUNITY): Payer: Medicare Other

## 2018-02-23 ENCOUNTER — Encounter (HOSPITAL_COMMUNITY)
Admission: EM | Disposition: A | Discharge status: Skilled Nursing Facility | Payer: Self-pay | Source: Home / Self Care | Attending: Internal Medicine

## 2018-02-23 DIAGNOSIS — I340 Nonrheumatic mitral (valve) insufficiency: Secondary | ICD-10-CM

## 2018-02-23 HISTORY — PX: TEE WITHOUT CARDIOVERSION: SHX5443

## 2018-02-23 HISTORY — PX: CARDIOVERSION: SHX1299

## 2018-02-23 LAB — GLUCOSE, CAPILLARY
Glucose-Capillary: 131 mg/dL — ABNORMAL HIGH (ref 65–99)
Glucose-Capillary: 103 mg/dL — ABNORMAL HIGH (ref 65–99)
Glucose-Capillary: 256 mg/dL — ABNORMAL HIGH (ref 65–99)
Glucose-Capillary: 90 mg/dL (ref 65–99)

## 2018-02-23 LAB — BASIC METABOLIC PANEL
Anion gap: 9 (ref 5–15)
BUN: 57 mg/dL — ABNORMAL HIGH (ref 6–20)
CO2: 29 mmol/L (ref 22–32)
Calcium: 7.9 mg/dL — ABNORMAL LOW (ref 8.9–10.3)
Chloride: 96 mmol/L — ABNORMAL LOW (ref 101–111)
Creatinine, Ser: 1.97 mg/dL — ABNORMAL HIGH (ref 0.61–1.24)
GFR calc Af Amer: 35 mL/min — ABNORMAL LOW (ref >60)
GFR calc non Af Amer: 30 mL/min — ABNORMAL LOW (ref >60)
Glucose, Bld: 131 mg/dL — ABNORMAL HIGH (ref 65–99)
Potassium: 4.2 mmol/L (ref 3.5–5.1)
Sodium: 134 mmol/L — ABNORMAL LOW (ref 135–145)

## 2018-02-23 LAB — GAMMA GT: GGT: 31 U/L (ref 7–50)

## 2018-02-23 SURGERY — CARDIOVERSION
Anesthesia: General

## 2018-02-23 MED ORDER — AMIODARONE HCL 200 MG PO TABS
200.0000 mg | ORAL_TABLET | Freq: Two times a day (BID) | ORAL | Status: DC
  Administered 2018-02-23 – 2018-02-24 (×2): 200 mg via ORAL
  Filled 2018-02-23 (×2): qty 1

## 2018-02-23 MED ORDER — SODIUM CHLORIDE 0.9% FLUSH: 3.0000 mL | INTRAVENOUS | Status: DC | PRN

## 2018-02-23 MED ORDER — PANTOPRAZOLE SODIUM 40 MG PO TBEC
40.0000 mg | DELAYED_RELEASE_TABLET | Freq: Every day | ORAL | Status: DC
  Administered 2018-02-24: 40 mg via ORAL
  Filled 2018-02-23: qty 1

## 2018-02-23 MED ORDER — APIXABAN 2.5 MG PO TABS
2.5000 mg | ORAL_TABLET | Freq: Two times a day (BID) | ORAL | Status: DC
  Administered 2018-02-23 – 2018-02-24 (×2): 2.5 mg via ORAL
  Filled 2018-02-23 (×2): qty 1

## 2018-02-23 MED ORDER — INSULIN ASPART 100 UNIT/ML ~~LOC~~ SOLN
0.0000 [IU] | Freq: Three times a day (TID) | SUBCUTANEOUS | Status: DC
  Administered 2018-02-23: 5 [IU] via SUBCUTANEOUS
  Administered 2018-02-24: 1 [IU] via SUBCUTANEOUS
  Administered 2018-02-24: 2 [IU] via SUBCUTANEOUS

## 2018-02-23 MED ORDER — PROPOFOL 10 MG/ML IV BOLUS
INTRAVENOUS | Status: DC | PRN
  Administered 2018-02-23 (×2): 10 mg via INTRAVENOUS

## 2018-02-23 MED ORDER — AMIODARONE HCL 200 MG PO TABS: 200.0000 mg | ORAL_TABLET | Freq: Every day | ORAL | Status: DC

## 2018-02-23 MED ORDER — PATIROMER SORBITEX CALCIUM 8.4 G PO PACK
8.4000 g | PACK | Freq: Every day | ORAL | Status: DC
  Administered 2018-02-24: 8.4 g via ORAL
  Filled 2018-02-23: qty 1

## 2018-02-23 MED ORDER — SENNOSIDES-DOCUSATE SODIUM 8.6-50 MG PO TABS: 1.0000 | ORAL_TABLET | Freq: Every day | ORAL | Status: DC | PRN

## 2018-02-23 MED ORDER — FUROSEMIDE 40 MG PO TABS
40.0000 mg | ORAL_TABLET | Freq: Every day | ORAL | Status: DC
  Administered 2018-02-24: 40 mg via ORAL
  Filled 2018-02-23: qty 1

## 2018-02-23 MED ORDER — ADULT MULTIVITAMIN W/MINERALS CH
1.0000 | ORAL_TABLET | Freq: Every day | ORAL | Status: DC
  Administered 2018-02-24: 1 via ORAL
  Filled 2018-02-23: qty 1

## 2018-02-23 MED ORDER — SODIUM CHLORIDE 0.9% FLUSH
3.0000 mL | Freq: Two times a day (BID) | INTRAVENOUS | Status: DC
  Administered 2018-02-23 – 2018-02-24 (×2): 3 mL via INTRAVENOUS

## 2018-02-23 MED ORDER — LACTATED RINGERS IV SOLN
INTRAVENOUS | Status: DC | PRN
  Administered 2018-02-23: 13:00:00 via INTRAVENOUS

## 2018-02-23 MED ORDER — OXYCODONE HCL 5 MG PO TABS: 5.0000 mg | ORAL_TABLET | ORAL | Status: DC | PRN

## 2018-02-23 MED ORDER — PRO-STAT SUGAR FREE PO LIQD
30.0000 mL | Freq: Every day | ORAL | Status: DC
  Administered 2018-02-24: 30 mL via ORAL
  Filled 2018-02-23: qty 30

## 2018-02-23 MED ORDER — VITAMIN D 1000 UNITS PO TABS
3000.0000 [IU] | ORAL_TABLET | Freq: Once | ORAL | Status: AC
  Administered 2018-02-23: 3000 [IU] via ORAL

## 2018-02-23 MED ORDER — ALPRAZOLAM 0.25 MG PO TABS: 0.2500 mg | ORAL_TABLET | Freq: Two times a day (BID) | ORAL | Status: DC | PRN

## 2018-02-23 MED ORDER — FAMOTIDINE 20 MG PO TABS
20.0000 mg | ORAL_TABLET | Freq: Every day | ORAL | Status: DC
  Administered 2018-02-23: 20 mg via ORAL
  Filled 2018-02-23: qty 1

## 2018-02-23 MED ORDER — HYDROCORTISONE 1 % EX CREA: 1.0000 "application " | TOPICAL_CREAM | Freq: Three times a day (TID) | CUTANEOUS | Status: DC | PRN

## 2018-02-23 MED ORDER — SODIUM CHLORIDE 0.9 % IV SOLN: INTRAVENOUS | Status: DC | PRN

## 2018-02-23 MED ORDER — PROPOFOL 500 MG/50ML IV EMUL
INTRAVENOUS | Status: DC | PRN
  Administered 2018-02-23: 75 ug/kg/min via INTRAVENOUS

## 2018-02-23 MED ORDER — ASPIRIN EC 81 MG PO TBEC
81.0000 mg | DELAYED_RELEASE_TABLET | Freq: Every day | ORAL | Status: DC
Start: 2018-02-24 — End: 2018-02-24
  Administered 2018-02-24: 81 mg via ORAL
  Filled 2018-02-23: qty 1

## 2018-02-23 MED ORDER — ONDANSETRON HCL 4 MG/2ML IJ SOLN: 4.0000 mg | Freq: Four times a day (QID) | INTRAMUSCULAR | Status: DC | PRN

## 2018-02-23 MED ORDER — INSULIN ASPART 100 UNIT/ML ~~LOC~~ SOLN: 0.0000 [IU] | Freq: Every day | SUBCUTANEOUS | Status: DC

## 2018-02-23 MED ORDER — HYDRALAZINE HCL 20 MG/ML IJ SOLN: 5.0000 mg | INTRAMUSCULAR | Status: DC | PRN

## 2018-02-23 MED ORDER — DOCUSATE SODIUM 100 MG PO CAPS
100.0000 mg | ORAL_CAPSULE | Freq: Two times a day (BID) | ORAL | Status: DC
  Administered 2018-02-23 – 2018-02-24 (×2): 100 mg via ORAL
  Filled 2018-02-23 (×2): qty 1

## 2018-02-23 MED ORDER — CALCIUM CARBONATE 1250 (500 CA) MG PO TABS
1.0000 | ORAL_TABLET | Freq: Once | ORAL | Status: AC
  Administered 2018-02-23: 500 mg via ORAL
  Filled 2018-02-23: qty 1

## 2018-02-23 MED ORDER — OXYCODONE-ACETAMINOPHEN 5-325 MG PO TABS: 1.0000 | ORAL_TABLET | ORAL | Status: DC | PRN

## 2018-02-23 MED ORDER — TAMSULOSIN HCL 0.4 MG PO CAPS
0.4000 mg | ORAL_CAPSULE | Freq: Every day | ORAL | Status: DC
  Administered 2018-02-23: 0.4 mg via ORAL
  Filled 2018-02-23: qty 1

## 2018-02-23 MED ORDER — VITAMIN D 1000 UNITS PO TABS
3000.0000 [IU] | ORAL_TABLET | Freq: Every day | ORAL | Status: DC
  Administered 2018-02-24: 3000 [IU] via ORAL
  Filled 2018-02-23: qty 3

## 2018-02-23 MED ORDER — BUTAMBEN-TETRACAINE-BENZOCAINE 2-2-14 % EX AERO
INHALATION_SPRAY | CUTANEOUS | Status: DC | PRN
  Administered 2018-02-23: 2 via TOPICAL

## 2018-02-23 MED ORDER — PHENYLEPHRINE 40 MCG/ML (10ML) SYRINGE FOR IV PUSH (FOR BLOOD PRESSURE SUPPORT)
PREFILLED_SYRINGE | INTRAVENOUS | Status: DC | PRN
  Administered 2018-02-23 (×2): 80 ug via INTRAVENOUS

## 2018-02-23 MED ORDER — SIMVASTATIN 20 MG PO TABS
20.0000 mg | ORAL_TABLET | Freq: Every day | ORAL | Status: DC
  Administered 2018-02-23: 20 mg via ORAL
  Filled 2018-02-23: qty 1

## 2018-02-23 MED ORDER — ACETAMINOPHEN 500 MG PO TABS: 500.0000 mg | ORAL_TABLET | Freq: Four times a day (QID) | ORAL | Status: DC | PRN

## 2018-02-23 MED ORDER — ATROPINE SULFATE 1 MG/10ML IJ SOSY: 0.5000 mg | PREFILLED_SYRINGE | INTRAMUSCULAR | Status: DC | PRN

## 2018-02-23 MED ORDER — OXYCODONE-ACETAMINOPHEN 10-325 MG PO TABS: 1.0000 | ORAL_TABLET | ORAL | Status: DC | PRN

## 2018-02-23 MED ORDER — CALCIUM CARBONATE 1250 (500 CA) MG PO TABS
1.0000 | ORAL_TABLET | Freq: Every day | ORAL | Status: DC
  Administered 2018-02-24: 500 mg via ORAL
  Filled 2018-02-23: qty 1

## 2018-02-23 NOTE — CV Procedure (Signed)
     Transesophageal Echocardiogram Note  [name] [name] [id number] 12-10-1932  Procedure: Transesophageal Echocardiogram Indications: atrial fibrillation  Procedure Details Consent: Obtained Time Out: Verified patient identification, verified procedure, site/side was marked, verified correct patient position, special equipment/implants available, Radiology Safety Procedures followed,  medications/allergies/relevent history reviewed, required imaging and test results available.  Performed  Medications: Iv propofol 126 mg was administered by anesthesia staff  LVEF 35-40%, possibly secondary to a-fib with RVR during acquisition, no thrombus in the left atrium or left atrial appendage   Complications: No apparent complications Patient did tolerate procedure well.  Ena Dawley, MD, [facility] 02/23/2018, 1:09 PM      Cardioversion Note  [name] [name] [id number] [birth year]  Procedure: DC Cardioversion Indications: atrial fibrillation  Procedure Details Consent: Obtained Time Out: Verified patient identification, verified procedure, site/side was marked, verified correct patient position, special equipment/implants available, Radiology Safety Procedures followed,  medications/allergies/relevent history reviewed, required imaging and test results available.    The patient has been on adequate anticoagulation.  The patient received IV propofol 126 mg  for sedation.  Synchronous cardioversion was performed at 120 joules. Performed The cardioversion was successful.   Complications: No apparent complications Patient did tolerate procedure well.   Ena Dawley, MD, [facility] 02/23/2018, 1:09 PM

## 2018-02-23 NOTE — H&P (View-Only) (Signed)
Progress Note  Patient Name: [name] [name] Date of Encounter: 02/23/2018  Primary Cardiologist: [name], MD   Subjective   Eyes any chest pain or shortness of breath this morning.  He remains in atrial fibrillation with heart rates in the 90s but still goes up into the 120s intermittently.  He has received 2 doses of Eliquis and will receive his third dose prior to undergoing TEE cardioversion today.  Inpatient Medications    Scheduled Meds: . albuterol  10 mg Nebulization Once  . amiodarone  200 mg Oral BID  . apixaban  2.5 mg Oral BID  . aspirin EC  81 mg Oral Daily  . calcium carbonate  1 tablet Oral Q breakfast  . cholecalciferol  3,000 Units Oral Daily  . docusate sodium  100 mg Oral BID  . famotidine  20 mg Oral QHS  . feeding supplement (PRO-STAT SUGAR FREE 64)  30 mL Oral Daily  . furosemide  40 mg Oral Daily  . insulin aspart  0-5 Units Subcutaneous QHS  . insulin aspart  0-9 Units Subcutaneous TID WC  . multivitamin with minerals  1 tablet Oral Daily  . pantoprazole  40 mg Oral Daily  . patiromer  8.4 g Oral Daily  . simvastatin  20 mg Oral QHS  . sodium chloride flush  3 mL Intravenous Q12H  . sodium chloride flush  3 mL Intravenous Q12H  . tamsulosin  0.4 mg Oral QPC supper   Continuous Infusions: . sodium chloride    . sodium chloride    . sodium chloride     PRN Meds: sodium chloride, acetaminophen, ALPRAZolam, atropine, hydrALAZINE, hydrocortisone cream, ondansetron (ZOFRAN) IV, oxyCODONE-acetaminophen **AND** oxyCODONE, senna-docusate, sodium chloride flush, sodium chloride flush   Vital Signs    Vitals:   02/23/18 0340 02/23/18 0408 02/23/18 0615 02/23/18 0731  BP: (!) 86/69   114/67  Pulse: (!) 50   (!) 38  Resp: (!) 22   15  Temp:    98.1 F (36.7 C)  TempSrc:    Oral  SpO2: (!) 80%   100%  Weight:  224 lb 6.9 oz (101.8 kg) 217 lb 2.5 oz (98.5 kg)     Intake/Output Summary (Last 24 hours) at 02/23/2018 0844 Last data filed at  02/23/2018 0547 Gross per 24 hour  Intake 837 ml  Output 1625 ml  Net -788 ml   Filed Weights   02/22/18 0617 02/23/18 0408 02/23/18 0615  Weight: 221 lb 1.9 oz (100.3 kg) 224 lb 6.9 oz (101.8 kg) 217 lb 2.5 oz (98.5 kg)    Telemetry    Atrial fibrillationin 90's  - Personally Reviewed  ECG    No new EKG to review - Personally Reviewed  Physical Exam   GEN: No acute distress.   Neck: No JVD Cardiac: irregularly irregular, no murmurs, rubs, or gallops.  Respiratory: Clear to auscultation bilaterally. GI: Soft, nontender, non-distended  MS: No edema; No deformity. Neuro:  Nonfocal  Psych: Normal affect   Labs    Chemistry Recent Labs  Lab 02/19/18 0347  02/21/18 0235  02/22/18 0249 02/22/18 0738 02/23/18 0518  NA 131*   < > 136  --   --  134* 134*  K >7.5*   < > 4.5  4.5   < > 4.6 4.9 4.2  CL 101   < > 97*  --   --  94* 96*  CO2 22   < > 32  --   --  31 29  GLUCOSE 173*   < > 156*  --   --  143* 131*  BUN 54*   < > 51*  --   --  57* 57*  CREATININE 1.77*   < > 2.16*  --   --  2.13* 1.97*  CALCIUM 8.2*   < > 7.7*  --   --  7.7* 7.9*  PROT 6.4*  --   --   --   --   --   --   ALBUMIN 2.1*  --   --   --   --   --   --   AST 37  --   --   --   --   --   --   ALT 24  --   --   --   --   --   --   ALKPHOS 216*  --   --   --   --   --   --   BILITOT 0.7  --   --   --   --   --   --   GFRNONAA 34*   < > 27*  --   --  27* 30*  GFRAA 39*   < > 31*  --   --  32* 35*  ANIONGAP 8   < > 7  --   --  9 9   < > = values in this interval not displayed.     Hematology Recent Labs  Lab 02/19/18 0347 02/19/18 0549 02/21/18 0235  WBC 11.4*  --  7.3  RBC 3.41*  --  3.27*  HGB 9.9* 8.5* 9.3*  HCT 31.8* 25.0* 31.0*  MCV 93.3  --  94.8  MCH 29.0  --  28.4  MCHC 31.1  --  30.0  RDW 16.6*  --  16.6*  PLT 335  --  256    Cardiac Enzymes Recent Labs  Lab 02/19/18 0347 02/19/18 1240 02/19/18 2016  TROPONINI 0.06* 0.05* 0.06*   No results for input(s): TROPIPOC in the  last 168 hours.   BNP Recent Labs  Lab 02/19/18 0347  BNP 488.1*     DDimer No results for input(s): DDIMER in the last 168 hours.   Radiology    No results found.  Cardiac Studies   Echo 02/19/18: Study Conclusions -Left ventricle: The cavity size was normal. There was mild focal basal hypertrophy of the septum. Systolic function was normal. The estimated ejection fraction was in the range of 50% to 55%. Wall motion was normal; there were no regional wall motion abnormalities. Doppler parameters are consistent with abnormal left ventricular relaxation (grade 1 diastolic dysfunction). - Aortic valve: Trileaflet; mildly thickened, mildly calcified leaflets. - Mitral valve: Calcified annulus. - Pulmonary arteries: Systolic pressure was mildly increased. PA peak pressure: 31 mm Hg (S).  Patient Profile     84 y.o. male with PMH of PAF, CAD with CTO of LAD with collaterals and PCI/DES to large ramus, chronic diastolic HF, CKD, DM who presented with shortness of breath and found to have severe hyperkalemia.  Assessment & Plan    1. Hyperkalemia -Due to iatrogenic intake of extra potassium prescribed by Urology in setting of UTI and foley cath -Apparently was started on potassium supplementation 3 times a day by his urologist -K+ remains stable at 4.2 today  2. Acute on chronic diastolic CHF -Likely exacerbated by bradycardia arrhythmias -BNP elevated at 488 and chest x-ray with CHF -2D echocardiogram today  showed EF 50 to 55% with no wall motion normalities. There was grade 1 diastolic dysfunction. -He put out 1.625 L yesterday and is net -10.3 L -weight is down  16 pounds from admit   -Creatinine trending down (1.82>2.38>2.28>2.16>2.13>1.97).His documented baseline recently has been 1.9-2. 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 This is  improving with diuresis. -He is now back on p.o. Lasix 40 mg  Daily -Follow renal function closely  3. Elevated troponin -Troponin has been 0.06> 0.05> 0.06 and consistent with demand ischemia in the setting of bradycardia and known underlying CAD with CTO of LAD as well as CHF . -2D echocardiogram showed low normal LV function with no regional wall motion abnormalities -His anginal equivalent in the past has been significant shortness of breath which he says he has not had recently until Sunday in the setting of significant bradycardia -No further ischemic work-up at this time.  5. PAF -Bradycardia has resolved but unfortunately went into Afib with RVR due to holding Amio and BB admission for bradycardia. -He was given amiodarone bolus of 150 mg IV and restarted on amiodarone 200 mg twice daily as well as Toprol-XL 25 mg daily but continues to be in A. fib with RVR. -GI cleared patient to be back on anticoagulation long-term so he was started on Eliquis 2.5 mg twice daily yesterday dosed by pharmacy for age>80 and creatinine > 1.5. -Cannot increase beta-blocker further due to soft blood pressure. -He has received 2 doses of Eliquis yesterday and will receive 1 more dose this morning prior to undergoing TEE/DCCV later today.  5. CAD -Prior cath showed CTO of LAD with collaterals, 80% ramus status post PCI with DES and 50 to 70% OM 2. Not a candidate for CTO of LAD. -He denies any chest pain and again shortness of breath is only worsened in the setting of bradycardia and prior to that he has been doing well. -trop minimally elevated with flat trend c/w demand ischemia in the setting of severe bradycardia, worsening renal function, hypotension and hyperkalemia -No further ischemic work-up at this time. -Continue aspirin 81 mg daily, blocker and statin  6.  Wide-complex tachycardia -Appears that this may be VT but cannot rule out aberration in setting of A. fib with RVR -2D  echocardiogram showed low normal LV function EF 50 to 55% -Potassium is in normal limits -Check mag level    For questions or updates, please contact [facility] Please consult [email or web address] for contact info under Cardiology/STEMI.      Signed, [name], MD  02/23/2018, 8:44 AM

## 2018-02-23 NOTE — Addendum Note (Signed)
Addendum  created 02/23/18 1430 by Harden Mo, CRNA   Intraprocedure Meds edited

## 2018-02-23 NOTE — Anesthesia Postprocedure Evaluation (Signed)
Anesthesia Post Note  Patient: [name] [name]  Procedure(s) Performed: CARDIOVERSION (N/A ) TRANSESOPHAGEAL ECHOCARDIOGRAM (TEE) (N/A )     Patient location during evaluation: Endoscopy Anesthesia Type: General Level of consciousness: awake and patient cooperative Pain management: pain level controlled Vital Signs Assessment: post-procedure vital signs reviewed and stable Respiratory status: spontaneous breathing, nonlabored ventilation, respiratory function stable and patient connected to nasal cannula oxygen Cardiovascular status: blood pressure returned to baseline and stable Postop Assessment: no apparent nausea or vomiting Anesthetic complications: no    Last Vitals:  Vitals:   02/23/18 1325 02/23/18 1340  BP: (!) 119/52 124/67  Pulse: 68 67  Resp: 17 19  Temp:    SpO2: 100% 100%    Last Pain:  Vitals:   02/23/18 1340  TempSrc:   PainSc: 0-No pain                 Verley Pariseau

## 2018-02-23 NOTE — Discharge Instructions (Addendum)

## 2018-02-23 NOTE — Interval H&P Note (Signed)
History and Physical Interval Note:  02/23/2018 12:38 PM  [name] [name]  has presented today for surgery, with the diagnosis of AFIB  The various methods of treatment have been discussed with the patient and family. After consideration of risks, benefits and other options for treatment, the patient has consented to  Procedure(s): CARDIOVERSION (N/A) TRANSESOPHAGEAL ECHOCARDIOGRAM (TEE) (N/A) as a surgical intervention .  The patient's history has been reviewed, patient examined, no change in status, stable for surgery.  I have reviewed the patient's chart and labs.  Questions were answered to the patient's satisfaction.     Ena Dawley

## 2018-02-23 NOTE — Anesthesia Preprocedure Evaluation (Addendum)
Anesthesia Evaluation  Patient identified by MRN, date of birth, ID band Patient awake    Reviewed: Allergy & Precautions, NPO status , Patient's Chart, lab work & pertinent test results  History of Anesthesia Complications Negative for: history of anesthetic complications  Airway Mallampati: II  TM Distance: >3 FB Neck ROM: Full    Dental  (+) Poor Dentition, Missing, Dental Advisory Given   Pulmonary shortness of breath,    breath sounds clear to auscultation       Cardiovascular hypertension, Pt. on medications + CAD and +CHF  + dysrhythmias Atrial Fibrillation  Rhythm:Irregular     Neuro/Psych negative neurological ROS  negative psych ROS   GI/Hepatic GERD  Medicated and Controlled,  Endo/Other  diabetes, Type 2, Insulin Dependent, Oral Hypoglycemic Agents  Renal/GU Renal InsufficiencyRenal disease     Musculoskeletal  (+) Arthritis ,   Abdominal   Peds  Hematology  (+) anemia ,   Anesthesia Other Findings DNR on chart: patient ok with CPR and medical intervention, desires to remain DNI  Reproductive/Obstetrics                          Anesthesia Physical  Anesthesia Plan  ASA: IV  Anesthesia Plan: General   Post-op Pain Management:  Regional for Post-op pain   Induction: Intravenous  PONV Risk Score and Plan: 2 and Treatment may vary due to age or medical condition  Airway Management Planned: Nasal Cannula and Natural Airway  Additional Equipment: None  Intra-op Plan:   Post-operative Plan:   Informed Consent: I have reviewed the patients History and Physical, chart, labs and discussed the procedure including the risks, benefits and alternatives for the proposed anesthesia with the patient or authorized representative who has indicated his/her understanding and acceptance.   Dental advisory given  Plan Discussed with: CRNA, Surgeon and Anesthesiologist  Anesthesia  Plan Comments:      Anesthesia Quick Evaluation

## 2018-02-23 NOTE — Anesthesia Procedure Notes (Signed)
Procedure Name: MAC Date/Time: 02/23/2018 12:45 PM Performed by: Harden Mo, CRNA Pre-anesthesia Checklist: Patient identified, Emergency Drugs available, Suction available and Patient being monitored Patient Re-evaluated:Patient Re-evaluated prior to induction Oxygen Delivery Method: Nasal cannula Preoxygenation: Pre-oxygenation with 100% oxygen Induction Type: IV induction Placement Confirmation: positive ETCO2 and breath sounds checked- equal and bilateral Dental Injury: Teeth and Oropharynx as per pre-operative assessment

## 2018-02-23 NOTE — Progress Notes (Signed)
  Echocardiogram Echocardiogram Transesophageal has been performed.  Eldrick Penick G Joban Colledge 02/23/2018, 1:12 PM

## 2018-02-23 NOTE — Progress Notes (Signed)
Progress Note  Patient Name: [name] [name] Date of Encounter: 02/23/2018  Primary Cardiologist: [name], MD   Subjective   Eyes any chest pain or shortness of breath this morning.  He remains in atrial fibrillation with heart rates in the 90s but still goes up into the 120s intermittently.  He has received 2 doses of Eliquis and will receive his third dose prior to undergoing TEE cardioversion today.  Inpatient Medications    Scheduled Meds: . albuterol  10 mg Nebulization Once  . amiodarone  200 mg Oral BID  . apixaban  2.5 mg Oral BID  . aspirin EC  81 mg Oral Daily  . calcium carbonate  1 tablet Oral Q breakfast  . cholecalciferol  3,000 Units Oral Daily  . docusate sodium  100 mg Oral BID  . famotidine  20 mg Oral QHS  . feeding supplement (PRO-STAT SUGAR FREE 64)  30 mL Oral Daily  . furosemide  40 mg Oral Daily  . insulin aspart  0-5 Units Subcutaneous QHS  . insulin aspart  0-9 Units Subcutaneous TID WC  . multivitamin with minerals  1 tablet Oral Daily  . pantoprazole  40 mg Oral Daily  . patiromer  8.4 g Oral Daily  . simvastatin  20 mg Oral QHS  . sodium chloride flush  3 mL Intravenous Q12H  . sodium chloride flush  3 mL Intravenous Q12H  . tamsulosin  0.4 mg Oral QPC supper   Continuous Infusions: . sodium chloride    . sodium chloride    . sodium chloride     PRN Meds: sodium chloride, acetaminophen, ALPRAZolam, atropine, hydrALAZINE, hydrocortisone cream, ondansetron (ZOFRAN) IV, oxyCODONE-acetaminophen **AND** oxyCODONE, senna-docusate, sodium chloride flush, sodium chloride flush   Vital Signs    Vitals:   02/23/18 0340 02/23/18 0408 02/23/18 0615 02/23/18 0731  BP: (!) 86/69   114/67  Pulse: (!) 50   (!) 38  Resp: (!) 22   15  Temp:    98.1 F (36.7 C)  TempSrc:    Oral  SpO2: (!) 80%   100%  Weight:  224 lb 6.9 oz (101.8 kg) 217 lb 2.5 oz (98.5 kg)     Intake/Output Summary (Last 24 hours) at 02/23/2018 0844 Last data filed at  02/23/2018 0547 Gross per 24 hour  Intake 837 ml  Output 1625 ml  Net -788 ml   Filed Weights   02/22/18 0617 02/23/18 0408 02/23/18 0615  Weight: 221 lb 1.9 oz (100.3 kg) 224 lb 6.9 oz (101.8 kg) 217 lb 2.5 oz (98.5 kg)    Telemetry    Atrial fibrillationin 90's  - Personally Reviewed  ECG    No new EKG to review - Personally Reviewed  Physical Exam   GEN: No acute distress.   Neck: No JVD Cardiac: irregularly irregular, no murmurs, rubs, or gallops.  Respiratory: Clear to auscultation bilaterally. GI: Soft, nontender, non-distended  MS: No edema; No deformity. Neuro:  Nonfocal  Psych: Normal affect   Labs    Chemistry Recent Labs  Lab 02/19/18 0347  02/21/18 0235  02/22/18 0249 02/22/18 0738 02/23/18 0518  NA 131*   < > 136  --   --  134* 134*  K >7.5*   < > 4.5  4.5   < > 4.6 4.9 4.2  CL 101   < > 97*  --   --  94* 96*  CO2 22   < > 32  --   --  31 29  GLUCOSE 173*   < > 156*  --   --  143* 131*  BUN 54*   < > 51*  --   --  57* 57*  CREATININE 1.77*   < > 2.16*  --   --  2.13* 1.97*  CALCIUM 8.2*   < > 7.7*  --   --  7.7* 7.9*  PROT 6.4*  --   --   --   --   --   --   ALBUMIN 2.1*  --   --   --   --   --   --   AST 37  --   --   --   --   --   --   ALT 24  --   --   --   --   --   --   ALKPHOS 216*  --   --   --   --   --   --   BILITOT 0.7  --   --   --   --   --   --   GFRNONAA 34*   < > 27*  --   --  27* 30*  GFRAA 39*   < > 31*  --   --  32* 35*  ANIONGAP 8   < > 7  --   --  9 9   < > = values in this interval not displayed.     Hematology Recent Labs  Lab 02/19/18 0347 02/19/18 0549 02/21/18 0235  WBC 11.4*  --  7.3  RBC 3.41*  --  3.27*  HGB 9.9* 8.5* 9.3*  HCT 31.8* 25.0* 31.0*  MCV 93.3  --  94.8  MCH 29.0  --  28.4  MCHC 31.1  --  30.0  RDW 16.6*  --  16.6*  PLT 335  --  256    Cardiac Enzymes Recent Labs  Lab 02/19/18 0347 02/19/18 1240 02/19/18 2016  TROPONINI 0.06* 0.05* 0.06*   No results for input(s): TROPIPOC in the  last 168 hours.   BNP Recent Labs  Lab 02/19/18 0347  BNP 488.1*     DDimer No results for input(s): DDIMER in the last 168 hours.   Radiology    No results found.  Cardiac Studies   Echo 02/19/18: Study Conclusions -Left ventricle: The cavity size was normal. There was mild focal basal hypertrophy of the septum. Systolic function was normal. The estimated ejection fraction was in the range of 50% to 55%. Wall motion was normal; there were no regional wall motion abnormalities. Doppler parameters are consistent with abnormal left ventricular relaxation (grade 1 diastolic dysfunction). - Aortic valve: Trileaflet; mildly thickened, mildly calcified leaflets. - Mitral valve: Calcified annulus. - Pulmonary arteries: Systolic pressure was mildly increased. PA peak pressure: 31 mm Hg (S).  Patient Profile     84 y.o. male with PMH of PAF, CAD with CTO of LAD with collaterals and PCI/DES to large ramus, chronic diastolic HF, CKD, DM who presented with shortness of breath and found to have severe hyperkalemia.  Assessment & Plan    1. Hyperkalemia -Due to iatrogenic intake of extra potassium prescribed by Urology in setting of UTI and foley cath -Apparently was started on potassium supplementation 3 times a day by his urologist -K+ remains stable at 4.2 today  2. Acute on chronic diastolic CHF -Likely exacerbated by bradycardia arrhythmias -BNP elevated at 488 and chest x-ray with CHF -2D echocardiogram today  showed EF 50 to 55% with no wall motion normalities. There was grade 1 diastolic dysfunction. -He put out 1.625 L yesterday and is net -10.3 L -weight is down  16 pounds from admit   -Creatinine trending down (1.82>2.38>2.28>2.16>2.13>1.97).His documented baseline recently has been 1.9-2. 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 This is  improving with diuresis. -He is now back on p.o. Lasix 40 mg  Daily -Follow renal function closely  3. Elevated troponin -Troponin has been 0.06> 0.05> 0.06 and consistent with demand ischemia in the setting of bradycardia and known underlying CAD with CTO of LAD as well as CHF . -2D echocardiogram showed low normal LV function with no regional wall motion abnormalities -His anginal equivalent in the past has been significant shortness of breath which he says he has not had recently until Sunday in the setting of significant bradycardia -No further ischemic work-up at this time.  5. PAF -Bradycardia has resolved but unfortunately went into Afib with RVR due to holding Amio and BB admission for bradycardia. -He was given amiodarone bolus of 150 mg IV and restarted on amiodarone 200 mg twice daily as well as Toprol-XL 25 mg daily but continues to be in A. fib with RVR. -GI cleared patient to be back on anticoagulation long-term so he was started on Eliquis 2.5 mg twice daily yesterday dosed by pharmacy for age>80 and creatinine > 1.5. -Cannot increase beta-blocker further due to soft blood pressure. -He has received 2 doses of Eliquis yesterday and will receive 1 more dose this morning prior to undergoing TEE/DCCV later today.  5. CAD -Prior cath showed CTO of LAD with collaterals, 80% ramus status post PCI with DES and 50 to 70% OM 2. Not a candidate for CTO of LAD. -He denies any chest pain and again shortness of breath is only worsened in the setting of bradycardia and prior to that he has been doing well. -trop minimally elevated with flat trend c/w demand ischemia in the setting of severe bradycardia, worsening renal function, hypotension and hyperkalemia -No further ischemic work-up at this time. -Continue aspirin 81 mg daily, blocker and statin  6.  Wide-complex tachycardia -Appears that this may be VT but cannot rule out aberration in setting of A. fib with RVR -2D  echocardiogram showed low normal LV function EF 50 to 55% -Potassium is in normal limits -Check mag level    For questions or updates, please contact [facility] Please consult [email or web address] for contact info under Cardiology/STEMI.      Signed, [name], MD  02/23/2018, 8:44 AM

## 2018-02-23 NOTE — Progress Notes (Signed)
PROGRESS NOTE    [name] [name]  WIO:[id number] DOB: 1932/10/20 DOA: 02/19/2018 PCP: [name], MD   Brief Narrative:84 y.o.malewith medical history significant ofhypertension, hyperlipidemia, diabetes mellitus, GERD, atrial fibrillation not on anticoagulants, CAD, stent placement, CHF with EF of 40%, CKD-3, colostomy, peptic ulcer disease, iron deficiency anemia, who presents with shortness of breath.  Patient states that she started having shortness of breath at about 1030PM, which has been progressively getting worse.Hedoes not have cough, chest pain, fever or chills. She has nausea, and vomited twice, but no abdominal pain or diarrhea. Patient denies symptoms of UTI. Patient states that he has a Foley catheter placed 2 weeks ago after UTI.He has bilateral leg edema. No unilateral weakness. Patient is alert and oriented x3. Per report pt is taking 90 mEq of KCl daily at [facility]  ED Course:pt was found to havetroponin 0.06, potassium 7.5, stable renal function, temperature normal, bradycardia, hypotensive with blood pressure 83/75, which improved to 117/43, tachypnea, oxygen saturation 74% which improved to 100% on room air, temperature normal. Chest x-ray showed pulmonary edema and a bilateral small pleural effusion. Patient is admitted to stepdown as inpatient. Cardiology fellow, ?Dr. Raiford Simmonds was consulted.     Assessment & Plan:   Principal Problem:   Hyperkalemia Active Problems:   CAD in native artery- total LAD, s/p RI DES 06/19/14   Dyslipidemia   CKD (chronic kidney disease), stage III ([name])   Acute on chronic combined systolic and diastolic CHF (congestive heart failure) ([name])   Paroxysmal atrial fibrillation ([name])   Bradycardia   Elevated troponin   GERD (gastroesophageal reflux disease)   Type II diabetes mellitus with renal manifestations ([name])   HTN (hypertension)   Pressure injury of skin   Acute on chronic diastolic CHF (congestive heart failure)  ([name])   Atrial fibrillation with RVR ([name])   1] hyperkalemia secondary to increased potassium intake.  Potassium has been normalized after multiple treatments with kayexalate and Veltassa.  Patient developed junctional rhythm and bradycardia and hypotension which has been resolved at this time but however he he is in A. fib and RVR in spite of being on amiodarone.  Patient started on Eliquis yesterday.  Patient is now status post TEE with no evidence of atria or atrium appendage l thrombus DCCV.  I did discuss with Dr. Karsten Ro who is 1 of his urologist.  He prescribed Fioricet potassium 15 mg 2 tablets 3 times a day for uric acid stones.  He reports this was a same treatment he has received in the past and he responded well.  His creatinine was 1.3 on March 2019.  2] acute on chronic combined systolic and diastolic CHF on Lasix 40 twice daily.   3] CKD improving.  4] left ankle trimalleolar fracture followed by Dr. Sharol Given.  Okay to walk with the boots once he is medically stable.  5] urine retention Foley catheter was removed yesterday and a condom cath was placed and he is able to urinate.  6] type 2 diabetes currently on sliding scale insulin.  Restart Actos and saxagliptin upon discharge.     DVT prophylaxis: eliquis Code Status:full Family Communication:none Disposition Plan:tbd Consultants: cards  Procedures:tee 5/10 Antimicrobials:none  Subjective:feels ok   Objective: Vitals:   02/23/18 1213 02/23/18 1220 02/23/18 1317 02/23/18 1320  BP: 106/68   (!) 97/52  Pulse: 77 88  68  Resp: 20 (!) 21  16  Temp: 97.8 F (36.6 C)   (!) 97.5 F (36.4 C)  TempSrc: Oral  Oral  SpO2: 99% 94% 100% 100%  Weight:        Intake/Output Summary (Last 24 hours) at 02/23/2018 1325 Last data filed at 02/23/2018 0900 Gross per 24 hour  Intake 597 ml  Output 1275 ml  Net -678 ml   Filed Weights   02/22/18 0617 02/23/18 0408 02/23/18 0615  Weight: 100.3 kg (221 lb 1.9 oz) 101.8 kg  (224 lb 6.9 oz) 98.5 kg (217 lb 2.5 oz)    Examination:  General exam: Appears calm and comfortable  Respiratory system: Clear to auscultation. Respiratory effort normal. Cardiovascular system: S1 & S2 heard, RRR. No JVD, murmurs, rubs, gallops or clicks. No pedal edema. Gastrointestinal system: Abdomen is nondistended, soft and nontender. No organomegaly or masses felt. Normal bowel sounds heard. Central nervous system: Alert and oriented. No focal neurological deficits. Extremities: Symmetric 5 x 5 power. Skin: No rashes, lesions or ulcers Psychiatry: Judgement and insight appear normal. Mood & affect appropriate.     Data Reviewed: I have personally reviewed following labs and imaging studies  CBC: Recent Labs  Lab 02/19/18 0347 02/19/18 0549 02/21/18 0235  WBC 11.4*  --  7.3  NEUTROABS 8.4*  --  4.8  HGB 9.9* 8.5* 9.3*  HCT 31.8* 25.0* 31.0*  MCV 93.3  --  94.8  PLT 335  --  353   Basic Metabolic Panel: Recent Labs  Lab 02/19/18 2016 02/20/18 0152  02/21/18 0235  02/21/18 1906 02/21/18 2337 02/22/18 0249 02/22/18 0738 02/23/18 0518  NA 136 134*  --  136  --   --   --   --  134* 134*  K 6.4* 5.5*  5.5*   < > 4.5  4.5   < > 5.1 4.5 4.6 4.9 4.2  CL 100* 98*  --  97*  --   --   --   --  94* 96*  CO2 26 28  --  32  --   --   --   --  31 29  GLUCOSE 117* 265*  --  156*  --   --   --   --  143* 131*  BUN 57* 57*  --  51*  --   --   --   --  57* 57*  CREATININE 2.38* 2.28*  --  2.16*  --   --   --   --  2.13* 1.97*  CALCIUM 8.2* 7.6*  --  7.7*  --   --   --   --  7.7* 7.9*   < > = values in this interval not displayed.   GFR: Estimated Creatinine Clearance: 32.9 mL/min (A) (by C-G formula based on SCr of 1.97 mg/dL (H)). Liver Function Tests: Recent Labs  Lab 02/19/18 0347  AST 37  ALT 24  ALKPHOS 216*  BILITOT 0.7  PROT 6.4*  ALBUMIN 2.1*   No results for input(s): LIPASE, AMYLASE in the last 168 hours. No results for input(s): AMMONIA in the last 168  hours. Coagulation Profile: No results for input(s): INR, PROTIME in the last 168 hours. Cardiac Enzymes: Recent Labs  Lab 02/19/18 0347 02/19/18 1240 02/19/18 2016  TROPONINI 0.06* 0.05* 0.06*   BNP (last 3 results) No results for input(s): PROBNP in the last 8760 hours. HbA1C: No results for input(s): HGBA1C in the last 72 hours. CBG: Recent Labs  Lab 02/22/18 1124 02/22/18 1609 02/22/18 2113 02/23/18 0727 02/23/18 1124  GLUCAP 148* 226* 99 131* 103*   Lipid Profile: No results for  input(s): CHOL, HDL, LDLCALC, TRIG, CHOLHDL, LDLDIRECT in the last 72 hours. Thyroid Function Tests: No results for input(s): TSH, T4TOTAL, FREET4, T3FREE, THYROIDAB in the last 72 hours. Anemia Panel: No results for input(s): VITAMINB12, FOLATE, FERRITIN, TIBC, IRON, RETICCTPCT in the last 72 hours. Sepsis Labs: No results for input(s): PROCALCITON, LATICACIDVEN in the last 168 hours.  Recent Results (from the past 240 hour(s))  MRSA PCR Screening     Status: None   Collection Time: 02/19/18 11:20 PM  Result Value Ref Range Status   MRSA by PCR NEGATIVE NEGATIVE Final    Comment:        The GeneXpert MRSA Assay (FDA approved for NASAL specimens only), is one component of a comprehensive MRSA colonization surveillance program. It is not intended to diagnose MRSA infection nor to guide or monitor treatment for MRSA infections. Performed at [facility], [place] [address], [place], [place] [address]          Radiology Studies: No results found.      Scheduled Meds: . [MAR Hold] amiodarone  200 mg Oral BID  . [MAR Hold] apixaban  2.5 mg Oral BID  . [MAR Hold] aspirin EC  81 mg Oral Daily  . [MAR Hold] calcium carbonate  1 tablet Oral Q breakfast  . [MAR Hold] cholecalciferol  3,000 Units Oral Daily  . [MAR Hold] docusate sodium  100 mg Oral BID  . [MAR Hold] famotidine  20 mg Oral QHS  . [MAR Hold] feeding supplement (PRO-STAT SUGAR FREE 64)  30 mL Oral Daily  .  [MAR Hold] furosemide  40 mg Oral Daily  . [MAR Hold] insulin aspart  0-5 Units Subcutaneous QHS  . [MAR Hold] insulin aspart  0-9 Units Subcutaneous TID WC  . [MAR Hold] multivitamin with minerals  1 tablet Oral Daily  . [MAR Hold] pantoprazole  40 mg Oral Daily  . [MAR Hold] patiromer  8.4 g Oral Daily  . [MAR Hold] simvastatin  20 mg Oral QHS  . [MAR Hold] sodium chloride flush  3 mL Intravenous Q12H  . [MAR Hold] sodium chloride flush  3 mL Intravenous Q12H  . [MAR Hold] tamsulosin  0.4 mg Oral QPC supper   Continuous Infusions: . [MAR Hold] sodium chloride    . sodium chloride 20 mL/hr at 02/23/18 1006  . sodium chloride       LOS: 4 days     Georgette Shell, MD  If 7PM-7AM, please contact night-coverage [email or web address] Password [email or web address] 02/23/2018, 1:25 PM

## 2018-02-23 NOTE — Transfer of Care (Signed)
Immediate Anesthesia Transfer of Care Note  Patient: [name] [name]  Procedure(s) Performed: CARDIOVERSION (N/A ) TRANSESOPHAGEAL ECHOCARDIOGRAM (TEE) (N/A )  Patient Location: Endoscopy Unit  Anesthesia Type:MAC  Level of Consciousness: awake, alert  and oriented  Airway & Oxygen Therapy: Patient Spontanous Breathing and Patient connected to nasal cannula oxygen  Post-op Assessment: Report given to RN, Post -op Vital signs reviewed and stable and Patient moving all extremities X 4  Post vital signs: Reviewed and stable  Last Vitals:  Vitals Value Taken Time  BP 97/52 02/23/2018  1:17 PM  Temp    Pulse 68 02/23/2018  1:18 PM  Resp 23 02/23/2018  1:18 PM  SpO2 100 % 02/23/2018  1:18 PM  Vitals shown include unvalidated device data.  Last Pain:  Vitals:   02/23/18 1213  TempSrc: Oral  PainSc: 0-No pain      Patients Stated Pain Goal: 0 (03/54/65 6812)  Complications: No apparent anesthesia complications

## 2018-02-24 DIAGNOSIS — H353132 Nonexudative age-related macular degeneration, bilateral, intermediate dry stage: Secondary | ICD-10-CM | POA: Diagnosis not present

## 2018-02-24 DIAGNOSIS — I5041 Acute combined systolic (congestive) and diastolic (congestive) heart failure: Secondary | ICD-10-CM | POA: Diagnosis not present

## 2018-02-24 DIAGNOSIS — R5381 Other malaise: Secondary | ICD-10-CM | POA: Diagnosis not present

## 2018-02-24 DIAGNOSIS — D6489 Other specified anemias: Secondary | ICD-10-CM | POA: Diagnosis not present

## 2018-02-24 DIAGNOSIS — I2510 Atherosclerotic heart disease of native coronary artery without angina pectoris: Secondary | ICD-10-CM | POA: Diagnosis not present

## 2018-02-24 DIAGNOSIS — R278 Other lack of coordination: Secondary | ICD-10-CM | POA: Diagnosis not present

## 2018-02-24 DIAGNOSIS — T847XXS Infection and inflammatory reaction due to other internal orthopedic prosthetic devices, implants and grafts, sequela: Secondary | ICD-10-CM | POA: Diagnosis not present

## 2018-02-24 DIAGNOSIS — D649 Anemia, unspecified: Secondary | ICD-10-CM | POA: Diagnosis not present

## 2018-02-24 DIAGNOSIS — T847XXD Infection and inflammatory reaction due to other internal orthopedic prosthetic devices, implants and grafts, subsequent encounter: Secondary | ICD-10-CM | POA: Diagnosis not present

## 2018-02-24 DIAGNOSIS — M6281 Muscle weakness (generalized): Secondary | ICD-10-CM | POA: Diagnosis not present

## 2018-02-24 DIAGNOSIS — S82852S Displaced trimalleolar fracture of left lower leg, sequela: Secondary | ICD-10-CM | POA: Diagnosis not present

## 2018-02-24 DIAGNOSIS — Z7401 Bed confinement status: Secondary | ICD-10-CM | POA: Diagnosis not present

## 2018-02-24 DIAGNOSIS — E113293 Type 2 diabetes mellitus with mild nonproliferative diabetic retinopathy without macular edema, bilateral: Secondary | ICD-10-CM | POA: Diagnosis not present

## 2018-02-24 DIAGNOSIS — E119 Type 2 diabetes mellitus without complications: Secondary | ICD-10-CM | POA: Diagnosis not present

## 2018-02-24 DIAGNOSIS — M2550 Pain in unspecified joint: Secondary | ICD-10-CM | POA: Diagnosis not present

## 2018-02-24 DIAGNOSIS — S82852D Displaced trimalleolar fracture of left lower leg, subsequent encounter for closed fracture with routine healing: Secondary | ICD-10-CM | POA: Diagnosis not present

## 2018-02-24 DIAGNOSIS — I480 Paroxysmal atrial fibrillation: Secondary | ICD-10-CM | POA: Diagnosis not present

## 2018-02-24 DIAGNOSIS — E785 Hyperlipidemia, unspecified: Secondary | ICD-10-CM | POA: Diagnosis not present

## 2018-02-24 DIAGNOSIS — H43813 Vitreous degeneration, bilateral: Secondary | ICD-10-CM | POA: Diagnosis not present

## 2018-02-24 DIAGNOSIS — E875 Hyperkalemia: Secondary | ICD-10-CM | POA: Diagnosis not present

## 2018-02-24 DIAGNOSIS — N183 Chronic kidney disease, stage 3 (moderate): Secondary | ICD-10-CM | POA: Diagnosis not present

## 2018-02-24 DIAGNOSIS — I420 Dilated cardiomyopathy: Secondary | ICD-10-CM | POA: Diagnosis not present

## 2018-02-24 DIAGNOSIS — I10 Essential (primary) hypertension: Secondary | ICD-10-CM | POA: Diagnosis not present

## 2018-02-24 DIAGNOSIS — R2681 Unsteadiness on feet: Secondary | ICD-10-CM | POA: Diagnosis not present

## 2018-02-24 DIAGNOSIS — I4891 Unspecified atrial fibrillation: Secondary | ICD-10-CM | POA: Diagnosis not present

## 2018-02-24 DIAGNOSIS — S8292XA Unspecified fracture of left lower leg, initial encounter for closed fracture: Secondary | ICD-10-CM | POA: Diagnosis not present

## 2018-02-24 DIAGNOSIS — M6389 Disorders of muscle in diseases classified elsewhere, multiple sites: Secondary | ICD-10-CM | POA: Diagnosis not present

## 2018-02-24 DIAGNOSIS — G629 Polyneuropathy, unspecified: Secondary | ICD-10-CM | POA: Diagnosis not present

## 2018-02-24 DIAGNOSIS — I361 Nonrheumatic tricuspid (valve) insufficiency: Secondary | ICD-10-CM | POA: Diagnosis not present

## 2018-02-24 DIAGNOSIS — N401 Enlarged prostate with lower urinary tract symptoms: Secondary | ICD-10-CM | POA: Diagnosis not present

## 2018-02-24 LAB — CBC
HCT: 32.1 % — ABNORMAL LOW (ref 39.0–52.0)
Hemoglobin: 9.7 g/dL — ABNORMAL LOW (ref 13.0–17.0)
MCH: 28.8 pg (ref 26.0–34.0)
MCHC: 30.2 g/dL (ref 30.0–36.0)
MCV: 95.3 fL (ref 78.0–100.0)
Platelets: 255 10*3/uL (ref 150–400)
RBC: 3.37 MIL/uL — ABNORMAL LOW (ref 4.22–5.81)
RDW: 16.6 % — ABNORMAL HIGH (ref 11.5–15.5)
WBC: 8.8 10*3/uL (ref 4.0–10.5)

## 2018-02-24 LAB — BASIC METABOLIC PANEL
Anion gap: 8 (ref 5–15)
BUN: 47 mg/dL — ABNORMAL HIGH (ref 6–20)
CO2: 31 mmol/L (ref 22–32)
Calcium: 8.1 mg/dL — ABNORMAL LOW (ref 8.9–10.3)
Chloride: 97 mmol/L — ABNORMAL LOW (ref 101–111)
Creatinine, Ser: 1.77 mg/dL — ABNORMAL HIGH (ref 0.61–1.24)
GFR calc Af Amer: 39 mL/min — ABNORMAL LOW (ref >60)
GFR calc non Af Amer: 34 mL/min — ABNORMAL LOW (ref >60)
Glucose, Bld: 145 mg/dL — ABNORMAL HIGH (ref 65–99)
Potassium: 4.2 mmol/L (ref 3.5–5.1)
Sodium: 136 mmol/L (ref 135–145)

## 2018-02-24 LAB — GLUCOSE, CAPILLARY
Glucose-Capillary: 131 mg/dL — ABNORMAL HIGH (ref 65–99)
Glucose-Capillary: 186 mg/dL — ABNORMAL HIGH (ref 65–99)

## 2018-02-24 MED ORDER — HYDROCORTISONE 1 % EX CREA: 1.0000 "application " | TOPICAL_CREAM | Freq: Three times a day (TID) | CUTANEOUS | 0 refills | Status: DC | PRN

## 2018-02-24 MED ORDER — ASPIRIN 81 MG PO TBEC: 81.0000 mg | DELAYED_RELEASE_TABLET | Freq: Every day | ORAL | Status: DC

## 2018-02-24 MED ORDER — FUROSEMIDE 40 MG PO TABS: 40.0000 mg | ORAL_TABLET | Freq: Every day | ORAL | 0 refills | Status: DC

## 2018-02-24 MED ORDER — APIXABAN 2.5 MG PO TABS: 2.5000 mg | ORAL_TABLET | Freq: Two times a day (BID) | ORAL | 0 refills | Status: DC

## 2018-02-24 NOTE — Progress Notes (Addendum)
Patient will Discharge To: [facility] Anticipated DC Date:02/24/18 Family Notified: yes, Melrose Nakayama, sister [phone] Transport By: Corey Harold   Per MD patient ready for DC to [facility] . RN, patient, patient's family, and facility notified of DC. Assessment, Fl2/Pasrr, and Discharge Summary sent to facility. RN given number for report [phone]). DC packet on chart. Ambulance transport requested for patient.   CSW signing off.  Reed Breech LCSWA [phone]

## 2018-02-24 NOTE — Progress Notes (Signed)
Progress Note  Patient Name: [name] [name] Date of Encounter: 02/24/2018  Primary Cardiologist: [name], MD   Subjective   NSR still no cardiac complaints taking PO likely back to [facility] today   Inpatient Medications    Scheduled Meds: . amiodarone  200 mg Oral BID  . apixaban  2.5 mg Oral BID  . aspirin EC  81 mg Oral Daily  . calcium carbonate  1 tablet Oral Q breakfast  . cholecalciferol  3,000 Units Oral Daily  . docusate sodium  100 mg Oral BID  . famotidine  20 mg Oral QHS  . feeding supplement (PRO-STAT SUGAR FREE 64)  30 mL Oral Daily  . furosemide  40 mg Oral Daily  . insulin aspart  0-5 Units Subcutaneous QHS  . insulin aspart  0-9 Units Subcutaneous TID WC  . multivitamin with minerals  1 tablet Oral Daily  . pantoprazole  40 mg Oral Daily  . patiromer  8.4 g Oral Daily  . simvastatin  20 mg Oral QHS  . sodium chloride flush  3 mL Intravenous Q12H  . tamsulosin  0.4 mg Oral QPC supper   Continuous Infusions:  PRN Meds: acetaminophen, ALPRAZolam, atropine, hydrALAZINE, hydrocortisone cream, ondansetron (ZOFRAN) IV, oxyCODONE-acetaminophen **AND** oxyCODONE, senna-docusate, senna-docusate, sodium chloride flush   Vital Signs    Vitals:   02/23/18 1703 02/23/18 2014 02/24/18 0705 02/24/18 0814  BP: (!) 121/50 (!) 111/48 133/63 127/61  Pulse: 70 63 61 61  Resp: 18 18 16    Temp: 97.8 F (36.6 C) 97.6 F (36.4 C)  97.7 F (36.5 C)  TempSrc: Oral Oral  Oral  SpO2: 99% 97%  97%  Weight:        Intake/Output Summary (Last 24 hours) at 02/24/2018 0842 Last data filed at 02/23/2018 2100 Gross per 24 hour  Intake 720 ml  Output 550 ml  Net 170 ml   Filed Weights   02/22/18 0617 02/23/18 0408 02/23/18 0615  Weight: 221 lb 1.9 oz (100.3 kg) 224 lb 6.9 oz (101.8 kg) 217 lb 2.5 oz (98.5 kg)    Telemetry    Atrial fibrillation with RVR.- Personally Reviewed  ECG    No new EKG to review.- Personally Reviewed  Physical Exam   Affect  appropriate Healthy:  appears stated age 37: normal Neck supple with no adenopathy JVP normal no bruits no thyromegaly Lungs clear with no wheezing and good diaphragmatic motion Heart:  S1/S2 no murmur, no rub, gallop or click PMI normal Abdomen: benighn, BS positve, no tenderness, no AAA no bruit.  No HSM or HJR Distal pulses intact with no bruits No edema Neuro non-focal Skin warm and dry No muscular weakness   Labs    Chemistry Recent Labs  Lab 02/19/18 0347  02/22/18 0738 02/23/18 0518 02/24/18 0455  NA 131*   < > 134* 134* 136  K >7.5*   < > 4.9 4.2 4.2  CL 101   < > 94* 96* 97*  CO2 22   < > 31 29 31   GLUCOSE 173*   < > 143* 131* 145*  BUN 54*   < > 57* 57* 47*  CREATININE 1.77*   < > 2.13* 1.97* 1.77*  CALCIUM 8.2*   < > 7.7* 7.9* 8.1*  PROT 6.4*  --   --   --   --   ALBUMIN 2.1*  --   --   --   --   AST 37  --   --   --   --  ALT 24  --   --   --   --   ALKPHOS 216*  --   --   --   --   BILITOT 0.7  --   --   --   --   GFRNONAA 34*   < > 27* 30* 34*  GFRAA 39*   < > 32* 35* 39*  ANIONGAP 8   < > 9 9 8    < > = values in this interval not displayed.     Hematology Recent Labs  Lab 02/19/18 0347 02/19/18 0549 02/21/18 0235 02/24/18 0455  WBC 11.4*  --  7.3 8.8  RBC 3.41*  --  3.27* 3.37*  HGB 9.9* 8.5* 9.3* 9.7*  HCT 31.8* 25.0* 31.0* 32.1*  MCV 93.3  --  94.8 95.3  MCH 29.0  --  28.4 28.8  MCHC 31.1  --  30.0 30.2  RDW 16.6*  --  16.6* 16.6*  PLT 335  --  256 255    Cardiac Enzymes Recent Labs  Lab 02/19/18 0347 02/19/18 1240 02/19/18 2016  TROPONINI 0.06* 0.05* 0.06*   No results for input(s): TROPIPOC in the last 168 hours.   BNP Recent Labs  Lab 02/19/18 0347  BNP 488.1*     DDimer No results for input(s): DDIMER in the last 168 hours.   Radiology    No results found.  Cardiac Studies   Echo 02/19/18: Study Conclusions - Left ventricle: The cavity size was normal. There was mild focal basal hypertrophy of the  septum. Systolic function was normal. The estimated ejection fraction was in the range of 50% to 55%. Wall motion was normal; there were no regional wall motion abnormalities. Doppler parameters are consistent with abnormal left ventricular relaxation (grade 1 diastolic dysfunction). - Aortic valve: Trileaflet; mildly thickened, mildly calcified leaflets. - Mitral valve: Calcified annulus. - Pulmonary arteries: Systolic pressure was mildly increased. PA peak pressure: 31 mm Hg (S).   Patient Profile     84 y.o. male with PMH of PAF, CAD with CTO of LAD with collaterals and PCI/DES to large ramus, chronic diastolic HF, CKD, DM who presented with shortness of breath and found to have severe hyperkalemia.  Assessment & Plan    1. Elevated troponin -Troponin has been 0.06> 0.05> 0.06 and consistent with demand ischemia in the setting of bradycardia and known underlying CAD with CTO of LAD as well as CHF . -2D echocardiogram showed low normal LV function with no regional wall motion abnormalities -His anginal equivalent in the past has been significant shortness of breath which he says he has not had recently until Sunday in the setting of significant bradycardia -No further ischemic work-up at this time.  2. PAF Post TEE/DCC maintaining NSR continue anticoagulation   Ok to d/c back to [facility] today  Will arrange outpatient f/u with Dr Onnie Boer

## 2018-02-24 NOTE — Discharge Summary (Signed)
Physician Discharge Summary  JAELYN CLONINGER MGQ:[id number] DOB: [birth year] DOA: 02/19/2018  PCP: [name], MD  Admit date: 02/19/2018 Discharge date: 02/24/2018  Admitted From: NH Disposition: NH Recommendations for Outpatient Follow-up:  1. Follow up with PCP in 1-2 weeks 2. Please obtain BMP/CBC in one week:  Home Health NONE Equipment/Devices none  Discharge Condition stable CODE STATUS: DO NOT RESUSCITATE Diet recommendation: Cardiac diet Brief/Interim Summary:84 y.o.malewith medical history significant ofhypertension, hyperlipidemia, diabetes mellitus, GERD, atrial fibrillation not on anticoagulants, CAD, stent placement, CHF with EF of 40%, CKD-3, colostomy, peptic ulcer disease, iron deficiency anemia, who presents with shortness of breath.  Patient states that she started having shortness of breath at about 1030PM, which has been progressively getting worse.Hedoes not have cough, chest pain, fever or chills. She has nausea, and vomited twice, but no abdominal pain or diarrhea. Patient denies symptoms of UTI. Patient states that he has a Foley catheter placed 2 weeks ago after UTI.He has bilateral leg edema. No unilateral weakness. Patient is alert and oriented x3. Per report pt is taking 90 mEq of KCl daily at [facility]  ED Course:pt was found to havetroponin 0.06, potassium 7.5, stable renal function, temperature normal, bradycardia, hypotensive with blood pressure 83/75, which improved to 117/43, tachypnea, oxygen saturation 74% which improved to 100% on room air, temperature normal. Chest x-ray showed pulmonary edema and a bilateral small pleural effusion. Patient is admitted to stepdown as inpatient. Cardiology fellow, ?Dr. Raiford Simmonds was consulted.    Discharge Diagnoses:  Principal Problem:   Hyperkalemia Active Problems:   CAD in native artery- total LAD, s/p RI DES 06/19/14   Dyslipidemia   CKD (chronic kidney disease), stage III ([name])   Acute on chronic  combined systolic and diastolic CHF (congestive heart failure) ([name])   Paroxysmal atrial fibrillation ([name])   Bradycardia   Elevated troponin   GERD (gastroesophageal reflux disease)   Type II diabetes mellitus with renal manifestations ([name])   HTN (hypertension)   Pressure injury of skin   Acute on chronic diastolic CHF (congestive heart failure) ([name])   Atrial fibrillation with RVR ([place])  1]hyperkalemia secondary to increased potassium intake. Potassium has been normalized after multiple treatments with kayexalateand Veltassa. Patient developed junctional rhythm and bradycardia and hypotension which has been resolved at this time but however he he is in A. fib and RVR in spite of being on amiodarone.  Patient started on Eliquis yesterday.  Patient is now status post TEE with no evidence of atria or atrium appendage  Thrombus.  He is status post DC cardioversion 02/23/2018 currently in normal sinus rhythm.  Follow-up with Dr. Radford Pax as an outpatient 2]acute on chronic combined systolic and diastolic CHF on Lasix 40 twice daily.   3]CKD improving.  4]left ankle trimalleolar fracture followed by Dr. Sharol Given. Okay to walk with the boots once he is medically stable.  Will up with Dr. Sharol Given as an outpatient  5]urine retention Foley catheter was removed   6]type 2 diabetes currently on sliding scale insulin.Restart Actos and saxagliptin upon discharge.      Discharge Instructions  Discharge Instructions    Call MD for:  difficulty breathing, headache or visual disturbances   Complete by:  As directed    Call MD for:  persistant dizziness or light-headedness   Complete by:  As directed    Call MD for:  persistant nausea and vomiting   Complete by:  As directed    Call MD for:  severe uncontrolled pain   Complete  by:  As directed    Diet - low sodium heart healthy   Complete by:  As directed    Increase activity slowly   Complete by:  As directed      Allergies as of  02/24/2018      Reactions   Brilinta [ticagrelor] Shortness Of Breath, Other (See Comments)   Changed to Plavix due to SOB   Clindamycin/lincomycin Anaphylaxis   Whole body skin peeling, blisters   Doxycycline Anaphylaxis   Penicillins Rash, Other (See Comments)   Has patient had a PCN reaction causing immediate rash, facial/tongue/throat swelling, SOB or lightheadedness with hypotension:Yes Has patient had a PCN reaction causing severe rash involving mucus membranes or skin necrosis: No Has patient had a PCN reaction that required hospitalization: No Has patient had a PCN reaction occurring within the last 10 years: No If all of the above answers are "NO", then may proceed with Cephalosporin use.   Sulfa Antibiotics Other (See Comments)   Pt does not remember the reaction, but it sure the allergy exists.      Medication List    STOP taking these medications   carvedilol 6.25 MG tablet Commonly known as:  COREG   ferrous sulfate 325 (65 FE) MG tablet   metoprolol succinate 25 MG 24 hr tablet Commonly known as:  TOPROL-XL   potassium chloride SA 15 MEQ tablet Commonly known as:  KLOR-CON M15   ranitidine 150 MG tablet Commonly known as:  ZANTAC     TAKE these medications   acetaminophen 500 MG tablet Commonly known as:  TYLENOL Take 1 tablet (500 mg total) by mouth every 6 (six) hours as needed (for pain or headaches). Available over the counter   amiodarone 200 MG tablet Commonly known as:  PACERONE Take 1 tablet (200 mg total) by mouth daily.   apixaban 2.5 MG Tabs tablet Commonly known as:  ELIQUIS Take 1 tablet (2.5 mg total) by mouth 2 (two) times daily.   aspirin 81 MG EC tablet Take 1 tablet (81 mg total) by mouth daily. Start taking on:  02/25/2018   calcium carbonate 600 MG Tabs tablet Commonly known as:  OS-CAL Take 600 mg by mouth daily with breakfast.   cholecalciferol 1000 units tablet Commonly known as:  VITAMIN D Take 3,000 Units by mouth daily.    docusate sodium 100 MG capsule Commonly known as:  COLACE Take 1 capsule (100 mg total) by mouth 2 (two) times daily.   feeding supplement (PRO-STAT SUGAR FREE 64) Liqd Take 30 mLs by mouth daily.   furosemide 40 MG tablet Commonly known as:  LASIX Take 1 tablet (40 mg total) by mouth daily. Start taking on:  02/25/2018   hydrocortisone cream 1 % Apply 1 application topically 3 (three) times daily as needed for itching (skin irritation).   multivitamin with minerals Tabs tablet Take 1 tablet by mouth daily.   omeprazole 20 MG capsule Commonly known as:  PRILOSEC Take 20 mg by mouth every evening.   oxyCODONE-acetaminophen 10-325 MG tablet Commonly known as:  PERCOCET Take 1 tablet by mouth every 4 (four) hours as needed for pain.   pioglitazone 30 MG tablet Commonly known as:  ACTOS Take 1 tablet (30 mg total) by mouth daily.   promethazine 25 MG tablet Commonly known as:  PHENERGAN Take 25 mg by mouth 3 (three) times daily as needed for nausea or vomiting.   saxagliptin HCl 5 MG Tabs tablet Commonly known as:  ONGLYZA Take 0.5 tablets (  2.5 mg total) by mouth daily.   SENNA S 8.6-50 MG tablet Generic drug:  senna-docusate Take 1 tablet by mouth daily as needed for mild constipation.   simvastatin 20 MG tablet Commonly known as:  ZOCOR Take 20 mg by mouth at bedtime.   tamsulosin 0.4 MG Caps capsule Commonly known as:  FLOMAX Take 1 capsule (0.4 mg total) by mouth daily after supper.   UNABLE TO FIND Take 120 mLs by mouth daily. Med Name: Med pass      Follow-up Information    Newt Minion, MD Follow up in 3 month(s).   Specialty:  Orthopedic Surgery Contact information: [place] [address] [phone]        Sueanne Margarita, MD Follow up.   Specialty:  Cardiology Contact information: [address] [place] [address] [phone]        [name], MD Follow up.   Specialty:  Internal  Medicine Contact information: [address] [place] [address] [phone]          Allergies  Allergen Reactions  . Brilinta [Ticagrelor] Shortness Of Breath and Other (See Comments)    Changed to Plavix due to SOB  . Clindamycin/Lincomycin Anaphylaxis    Whole body skin peeling, blisters  . Doxycycline Anaphylaxis  . Penicillins Rash and Other (See Comments)    Has patient had a PCN reaction causing immediate rash, facial/tongue/throat swelling, SOB or lightheadedness with hypotension:Yes Has patient had a PCN reaction causing severe rash involving mucus membranes or skin necrosis: No Has patient had a PCN reaction that required hospitalization: No Has patient had a PCN reaction occurring within the last 10 years: No If all of the above answers are "NO", then may proceed with Cephalosporin use.  . Sulfa Antibiotics Other (See Comments)    Pt does not remember the reaction, but it sure the allergy exists.    Consultations:CARDIOLOGY Procedures/Studies: Dg Chest Portable 1 View  Result Date: 02/19/2018 CLINICAL DATA:  84 y/o  M; shortness of breath. EXAM: PORTABLE CHEST 1 VIEW COMPARISON:  12/08/2017 chest radiograph FINDINGS: Stable cardiomegaly given projection and technique. Diffuse reticular and patchy opacities of the lungs. Small effusions. Degenerative changes of the mid and lower thoracic spine. IMPRESSION: Stable cardiomegaly. Reticular and patchy opacities of the lungs, likely interstitial and alveolar pulmonary edema. Small bilateral effusions. Electronically Signed   By: Kristine Garbe M.D.   On: 02/19/2018 03:37    (Echo, Carotid, EGD, Colonoscopy, ERCP)    Subjective:   Discharge Exam: Vitals:   02/24/18 0705 02/24/18 0814  BP: 133/63 127/61  Pulse: 61 61  Resp: 16   Temp:  97.7 F (36.5 C)  SpO2:  97%   Vitals:   02/23/18 1703 02/23/18 2014 02/24/18 0705 02/24/18 0814  BP: (!) 121/50 (!) 111/48 133/63 127/61  Pulse:  70 63 61 61  Resp: 18 18 16    Temp: 97.8 F (36.6 C) 97.6 F (36.4 C)  97.7 F (36.5 C)  TempSrc: Oral Oral  Oral  SpO2: 99% 97%  97%  Weight:        General: Pt is alert, awake, not in acute distress Cardiovascular: RRR, S1/S2 +, no rubs, no gallops Respiratory: CTA bilaterally, no wheezing, no rhonchi Abdominal: Soft, NT, ND, bowel sounds + Extremities: no edema, no cyanosis    The results of significant diagnostics from this hospitalization (including imaging, microbiology, ancillary and laboratory) are listed below for reference.  Microbiology: Recent Results (from the past 240 hour(s))  MRSA PCR Screening     Status: None   Collection Time: 02/19/18 11:20 PM  Result Value Ref Range Status   MRSA by PCR NEGATIVE NEGATIVE Final    Comment:        The GeneXpert MRSA Assay (FDA approved for NASAL specimens only), is one component of a comprehensive MRSA colonization surveillance program. It is not intended to diagnose MRSA infection nor to guide or monitor treatment for MRSA infections. Performed at [facility], [place] [address] [place], [place] [address]      Labs: BNP (last 3 results) Recent Labs    02/19/18 0347  BNP 678.9*   Basic Metabolic Panel: Recent Labs  Lab 02/20/18 0152  02/21/18 0235  02/21/18 2337 02/22/18 0249 02/22/18 0738 02/23/18 0518 02/24/18 0455  NA 134*  --  136  --   --   --  134* 134* 136  K 5.5*  5.5*   < > 4.5  4.5   < > 4.5 4.6 4.9 4.2 4.2  CL 98*  --  97*  --   --   --  94* 96* 97*  CO2 28  --  32  --   --   --  31 29 31   GLUCOSE 265*  --  156*  --   --   --  143* 131* 145*  BUN 57*  --  51*  --   --   --  57* 57* 47*  CREATININE 2.28*  --  2.16*  --   --   --  2.13* 1.97* 1.77*  CALCIUM 7.6*  --  7.7*  --   --   --  7.7* 7.9* 8.1*   < > = values in this interval not displayed.   Liver Function Tests: Recent Labs  Lab 02/19/18 0347  AST 37  ALT 24  ALKPHOS 216*  BILITOT 0.7  PROT 6.4*  ALBUMIN 2.1*    No results for input(s): LIPASE, AMYLASE in the last 168 hours. No results for input(s): AMMONIA in the last 168 hours. CBC: Recent Labs  Lab 02/19/18 0347 02/19/18 0549 02/21/18 0235 02/24/18 0455  WBC 11.4*  --  7.3 8.8  NEUTROABS 8.4*  --  4.8  --   HGB 9.9* 8.5* 9.3* 9.7*  HCT 31.8* 25.0* 31.0* 32.1*  MCV 93.3  --  94.8 95.3  PLT 335  --  256 255   Cardiac Enzymes: Recent Labs  Lab 02/19/18 0347 02/19/18 1240 02/19/18 2016  TROPONINI 0.06* 0.05* 0.06*   BNP: Invalid input(s): POCBNP CBG: Recent Labs  Lab 02/23/18 0727 02/23/18 1124 02/23/18 1659 02/23/18 2123 02/24/18 0811  GLUCAP 131* 103* 256* 90 131*   D-Dimer No results for input(s): DDIMER in the last 72 hours. Hgb A1c No results for input(s): HGBA1C in the last 72 hours. Lipid Profile No results for input(s): CHOL, HDL, LDLCALC, TRIG, CHOLHDL, LDLDIRECT in the last 72 hours. Thyroid function studies No results for input(s): TSH, T4TOTAL, T3FREE, THYROIDAB in the last 72 hours.  Invalid input(s): FREET3 Anemia work up No results for input(s): VITAMINB12, FOLATE, FERRITIN, TIBC, IRON, RETICCTPCT in the last 72 hours. Urinalysis    Component Value Date/Time   COLORURINE YELLOW 12/08/2017 1600   APPEARANCEUR HAZY (A) 12/08/2017 1600   LABSPEC 1.018 12/08/2017 1600   PHURINE 8.0 12/08/2017 1600   GLUCOSEU NEGATIVE 12/08/2017 1600   HGBUR SMALL (A) 12/08/2017 1600   BILIRUBINUR NEGATIVE 12/08/2017 1600  KETONESUR NEGATIVE 12/08/2017 1600   PROTEINUR NEGATIVE 12/08/2017 1600   UROBILINOGEN 0.2 10/22/2014 1052   NITRITE NEGATIVE 12/08/2017 1600   LEUKOCYTESUR MODERATE (A) 12/08/2017 1600   Sepsis Labs Invalid input(s): PROCALCITONIN,  WBC,  LACTICIDVEN Microbiology Recent Results (from the past 240 hour(s))  MRSA PCR Screening     Status: None   Collection Time: 02/19/18 11:20 PM  Result Value Ref Range Status   MRSA by PCR NEGATIVE NEGATIVE Final    Comment:        The GeneXpert MRSA  Assay (FDA approved for NASAL specimens only), is one component of a comprehensive MRSA colonization surveillance program. It is not intended to diagnose MRSA infection nor to guide or monitor treatment for MRSA infections. Performed at [facility], [place] [address], [place], [place] [address]      Time coordinating discharge: 39 minutes  SIGNED:   Georgette Shell, MD  [facility] 02/24/2018, 9:35 AM Pager   If 7PM-7AM, please contact night-coverage [email or web address] Password [email or web address]

## 2018-02-24 NOTE — Plan of Care (Signed)
  Problem: Clinical Measurements: Goal: Respiratory complications will improve Outcome: Progressing Note:  No s/s of respiratory complications. Goal: Cardiovascular complication will be avoided Outcome: Progressing Note:  No s/s of cardiovascular complications.   Problem: Pain Managment: Goal: General experience of comfort will improve Outcome: Progressing Note:  No c/o pain or discomfort noted.

## 2018-02-25 ENCOUNTER — Encounter (HOSPITAL_COMMUNITY): Payer: Self-pay | Admitting: Cardiology

## 2018-02-25 DIAGNOSIS — I480 Paroxysmal atrial fibrillation: Secondary | ICD-10-CM | POA: Diagnosis not present

## 2018-02-25 DIAGNOSIS — N401 Enlarged prostate with lower urinary tract symptoms: Secondary | ICD-10-CM | POA: Diagnosis not present

## 2018-02-25 DIAGNOSIS — D6489 Other specified anemias: Secondary | ICD-10-CM | POA: Diagnosis not present

## 2018-02-25 DIAGNOSIS — S8292XA Unspecified fracture of left lower leg, initial encounter for closed fracture: Secondary | ICD-10-CM | POA: Diagnosis not present

## 2018-02-25 DIAGNOSIS — I2510 Atherosclerotic heart disease of native coronary artery without angina pectoris: Secondary | ICD-10-CM | POA: Diagnosis not present

## 2018-02-25 DIAGNOSIS — I5041 Acute combined systolic (congestive) and diastolic (congestive) heart failure: Secondary | ICD-10-CM | POA: Diagnosis not present

## 2018-02-25 DIAGNOSIS — N183 Chronic kidney disease, stage 3 (moderate): Secondary | ICD-10-CM | POA: Diagnosis not present

## 2018-02-26 ENCOUNTER — Telehealth (INDEPENDENT_AMBULATORY_CARE_PROVIDER_SITE_OTHER): Payer: Self-pay | Admitting: Orthopedic Surgery

## 2018-02-26 NOTE — Telephone Encounter (Signed)
Mrs. Aundra Dubin called from [facility] wanting some clarifications on the patients upgrade in the hospital? And also wanted to know if he still needed to come to his appointment for this Wednesday. CB # [phone]

## 2018-02-27 ENCOUNTER — Telehealth (INDEPENDENT_AMBULATORY_CARE_PROVIDER_SITE_OTHER): Payer: Self-pay | Admitting: Orthopedic Surgery

## 2018-02-27 ENCOUNTER — Ambulatory Visit (INDEPENDENT_AMBULATORY_CARE_PROVIDER_SITE_OTHER): Payer: Medicare Other | Admitting: Cardiology

## 2018-02-27 ENCOUNTER — Encounter: Payer: Self-pay | Admitting: Cardiology

## 2018-02-27 VITALS — BP 114/64 | HR 70 | Ht 70.0 in | Wt 225.0 lb

## 2018-02-27 DIAGNOSIS — I420 Dilated cardiomyopathy: Secondary | ICD-10-CM | POA: Insufficient documentation

## 2018-02-27 DIAGNOSIS — I2510 Atherosclerotic heart disease of native coronary artery without angina pectoris: Secondary | ICD-10-CM | POA: Diagnosis not present

## 2018-02-27 DIAGNOSIS — I10 Essential (primary) hypertension: Secondary | ICD-10-CM

## 2018-02-27 DIAGNOSIS — E785 Hyperlipidemia, unspecified: Secondary | ICD-10-CM | POA: Diagnosis not present

## 2018-02-27 DIAGNOSIS — I480 Paroxysmal atrial fibrillation: Secondary | ICD-10-CM

## 2018-02-27 HISTORY — DX: Dilated cardiomyopathy: I42.0

## 2018-02-27 NOTE — Patient Instructions (Signed)
Medication Instructions:  Your physician recommends that you continue on your current medications as directed. Please refer to the Current Medication list given to you today.  If you need a refill on your cardiac medications, please contact your pharmacy first.  Labwork: None ordered   Testing/Procedures: Your physician has requested that you have an echocardiogram. Echocardiography is a painless test that uses sound waves to create images of your heart. It provides your doctor with information about the size and shape of your heart and how well your heart's chambers and valves are working. This procedure takes approximately one hour. There are no restrictions for this procedure.  Follow-Up: Your physician recommends that you schedule a follow-up appointment in: 3 months with Dr. Radford Pax.  Any Other Special Instructions Will Be Listed Below (If Applicable).   Thank you for choosing [facility], RN  [phone]  If you need a refill on your cardiac medications before your next appointment, please call your pharmacy.

## 2018-02-27 NOTE — Addendum Note (Signed)
Addended by: Teressa Senter on: 02/27/2018 09:40 AM   Modules accepted: Orders

## 2018-02-27 NOTE — Telephone Encounter (Signed)
[facility]  [place]  [phone]   [facility] needs Clarification on Fracture boot. Pt stated to nurse he didn't need to wear boot per Dr.Duda

## 2018-02-27 NOTE — Telephone Encounter (Signed)
You saw this pt in the hospital on 02/21/18. Well healed incision from an infected HDW removal ankle fx and advanced him to full wtb and reg shoe wear. Saw while he was admitted for cardia issues. sch for appt tomorrow ok to push it out a few weeks or keep appt?

## 2018-02-27 NOTE — Telephone Encounter (Signed)
I called and kelly has left for the day. Advised that I will try again tomorrow.

## 2018-02-27 NOTE — Progress Notes (Signed)
Cardiology Office Note:    Date:  02/27/2018   ID:  [name] [name], DOB 1933/06/18, MRN [id number]  PCP:  [name], MD  Cardiologist:  [name], MD    Referring MD: [name], MD   Chief Complaint  Patient presents with  . Atrial Fibrillation  . Coronary Artery Disease  . Hypertension  . Hyperlipidemia  . Cardiomyopathy    History of Present Illness:    [name] [name] is a 84 y.o. male with a hx of  PAF, 2 vessel ASCAD with chronic total occlusion of the LAD with minimal collaterals and severe disease of the large ramus and normal LVF s/p DES to the ramus. He also has PAF, chronic diastolic CHF.  He was admitted last week with profound bradycardia after getting potassium supplements from his urologist for urinary tract infection along with antibiotics and presented with marked bradycardia and profound hyperkalemia.  Hyperkalemia was reversed and bradycardia resolved within the patient went into rapid atrial fibrillation after holding amiodarone and beta-blockers.  After consultation with GI he was placed back on Eliquis 5 mg twice daily and subsequently underwent TEE cardioversion successfully.  EF was noted to be 35 to 40% during TEE which is felt to probably be related to tachycardia mediated cardiomyopathy.  Is now here for follow-up.  He is here today for followup and is doing well.  He denies any chest pain or pressure, SOB, DOE, PND, orthopnea, LE edema, dizziness, palpitations or syncope. He is compliant with his meds and is tolerating meds with no SE.    Past Medical History:  Diagnosis Date  . Allergic reaction caused by a drug 11/2017  . Anemia   . Atrial flutter ([place])    a. s/p RFCA of counterclockwise cavo-tricuspid isthmus dependent atrial flutter 2009 by Dr. Rayann Heman.  Marland Kitchen CAD (coronary artery disease) 06/2014   a. 06/2014: abnl nuc. Cath: Totally occluded LAD with faint collaterals (not a candidate for CTO PCI), 90% ramus s/p DES, 50-70% OM2.  . Chronic  diastolic CHF (congestive heart failure) ([place])    a. 2D Echo 10/23/13: EF 50-55%, basal mid-inf HK, grade 2 DD, mild MR, mod dilated LA, no sig change from prior.  . CKD (chronic kidney disease), stage III ([place])   . Colostomy in place [facility])   . Complication of anesthesia    " during my kidney stone surgery my heart went out of rhythm  . Compression fracture of L1 lumbar vertebra ([name])   . DCM (dilated cardiomyopathy) ([place]) 02/27/2018   EF 35-40% by TEE 02/2018 felt tachycardia mediated  . Diabetes ([place])    TYPE 2   . Dyslipidemia   . Dyspnea   . Frequent PVCs   . GERD (gastroesophageal reflux disease)   . History of blood transfusion   . History of kidney stones   . History of peptic ulcer disease   . HTN (hypertension)   . Hx of acute renal failure 01/02/10-3/24-11   due to hypovolemic shock, gastroenteritis and dehydration,hospitalized . Did requre a few days of dialysis. Cr at discharge was 1.8  . Kidney disease   . Kidney stone may 2009   Right hydronephrosis, S/P stone removal  . Lower back pain   . Macular degeneration   . OA (osteoarthritis) of hip   . Obesity   . Osteopenia   . PAF (paroxysmal atrial fibrillation) ([name])    not on long term anticoagulation due to history of heme positive stools and anemia  . Shingles  episode  . Small bowel obstruction, partial ([place]) 2009   Episode  . Ulcerative colitis ([place])    a. s/p total colectomy remotely.    Past Surgical History:  Procedure Laterality Date  . BACK SURGERY    . CARDIAC CATHETERIZATION  06/19/2014  . CARDIOVERSION N/A 02/23/2018   Procedure: CARDIOVERSION;  Surgeon: Dorothy Spark, MD;  Location: [facility] ENDOSCOPY;  Service: Cardiovascular;  Laterality: N/A;  . COLON RESECTION    . COLOSTOMY    . CORONARY ANGIOPLASTY    . CORONARY STENT PLACEMENT  06/19/2014   DES       dr Martinique  . HARDWARE REMOVAL Left 12/01/2017   Procedure: HARDWARE REMOVAL LEFT ANKLE;  Surgeon: Newt Minion, MD;  Location: [place];   Service: Orthopedics;  Laterality: Left;  . HERNIA REPAIR    . LEFT AND RIGHT HEART CATHETERIZATION WITH CORONARY ANGIOGRAM N/A 06/19/2014   Procedure: LEFT AND RIGHT HEART CATHETERIZATION WITH CORONARY ANGIOGRAM;  Surgeon: Peter M Martinique, MD;  Location: [facility] CATH LAB;  Service: Cardiovascular;  Laterality: N/A;  . ORIF ANKLE FRACTURE Left 10/25/2017   Procedure: OPEN REDUCTION INTERNAL FIXATION (ORIF) LEFT ANKLE FRACTURE;  Surgeon: Newt Minion, MD;  Location: [place];  Service: Orthopedics;  Laterality: Left;  . TEE WITHOUT CARDIOVERSION N/A 02/23/2018   Procedure: TRANSESOPHAGEAL ECHOCARDIOGRAM (TEE);  Surgeon: Dorothy Spark, MD;  Location: [facility] ENDOSCOPY;  Service: Cardiovascular;  Laterality: N/A;  . TONSILLECTOMY      Current Medications: Current Meds  Medication Sig  . acetaminophen (TYLENOL) 500 MG tablet Take 1 tablet (500 mg total) by mouth every 6 (six) hours as needed (for pain or headaches). Available over the counter  . Amino Acids-Protein Hydrolys (FEEDING SUPPLEMENT, PRO-STAT SUGAR FREE 64,) LIQD Take 30 mLs by mouth daily.  Marland Kitchen amiodarone (PACERONE) 200 MG tablet Take 1 tablet (200 mg total) by mouth daily.  Marland Kitchen apixaban (ELIQUIS) 2.5 MG TABS tablet Take 1 tablet (2.5 mg total) by mouth 2 (two) times daily.  Marland Kitchen aspirin EC 81 MG EC tablet Take 1 tablet (81 mg total) by mouth daily.  . calcium carbonate (OS-CAL) 600 MG TABS tablet Take 600 mg by mouth daily with breakfast.  . cholecalciferol (VITAMIN D) 1000 units tablet Take 3,000 Units by mouth daily.  Marland Kitchen docusate sodium (COLACE) 100 MG capsule Take 1 capsule (100 mg total) by mouth 2 (two) times daily.  . furosemide (LASIX) 40 MG tablet Take 1 tablet (40 mg total) by mouth daily.  . hydrocortisone cream 1 % Apply 1 application topically 3 (three) times daily as needed for itching (skin irritation).  . Multiple Vitamin (MULTIVITAMIN WITH MINERALS) TABS tablet Take 1 tablet by mouth daily.  Marland Kitchen omeprazole (PRILOSEC) 20 MG capsule Take 20  mg by mouth every evening.  Marland Kitchen oxyCODONE-acetaminophen (PERCOCET) 10-325 MG tablet Take 1 tablet by mouth every 4 (four) hours as needed for pain.  . pioglitazone (ACTOS) 30 MG tablet Take 1 tablet (30 mg total) by mouth daily.  . promethazine (PHENERGAN) 25 MG tablet Take 25 mg by mouth 3 (three) times daily as needed for nausea or vomiting.  . saxagliptin HCl (ONGLYZA) 5 MG TABS tablet Take 0.5 tablets (2.5 mg total) by mouth daily.  Marland Kitchen senna-docusate (SENNA S) 8.6-50 MG tablet Take 1 tablet by mouth daily as needed for mild constipation.  . simvastatin (ZOCOR) 20 MG tablet Take 20 mg by mouth at bedtime.   . tamsulosin (FLOMAX) 0.4 MG CAPS capsule Take 1 capsule (0.4  mg total) by mouth daily after supper.  Marland Kitchen UNABLE TO FIND Take 120 mLs by mouth daily. Med Name: Med pass     Allergies:   Brilinta [ticagrelor]; Clindamycin/lincomycin; Doxycycline; Penicillins; and Sulfa antibiotics   Social History   Socioeconomic History  . Marital status: Widowed    Spouse name: Not on file  . Number of children: Not on file  . Years of education: Not on file  . Highest education level: Not on file  Occupational History  . Not on file  Social Needs  . Financial resource strain: Not on file  . Food insecurity:    Worry: Not on file    Inability: Not on file  . Transportation needs:    Medical: Not on file    Non-medical: Not on file  Tobacco Use  . Smoking status: Never Smoker  . Smokeless tobacco: Never Used  Substance and Sexual Activity  . Alcohol use: No  . Drug use: No  . Sexual activity: Not on file  Lifestyle  . Physical activity:    Days per week: Not on file    Minutes per session: Not on file  . Stress: Not on file  Relationships  . Social connections:    Talks on phone: Not on file    Gets together: Not on file    Attends religious service: Not on file    Active member of club or organization: Not on file    Attends meetings of clubs or organizations: Not on file     Relationship status: Not on file  Other Topics Concern  . Not on file  Social History Narrative  . Not on file     Family History: The patient's family history includes Colon cancer in his mother.  ROS:   Please see the history of present illness.    ROS  All other systems reviewed and negative.   EKGs/Labs/Other Studies Reviewed:    The following studies were reviewed today: Hospital notes, TEE  EKG:  EKG is not  ordered today.   Recent Labs: 02/19/2018: ALT 24; B Natriuretic Peptide 488.1 02/24/2018: BUN 47; Creatinine, Ser 1.77; Hemoglobin 9.7; Platelets 255; Potassium 4.2; Sodium 136   Recent Lipid Panel    Component Value Date/Time   CHOL 97 02/20/2018 0152   TRIG 85 02/20/2018 0152   HDL 36 (L) 02/20/2018 0152   CHOLHDL 2.7 02/20/2018 0152   VLDL 17 02/20/2018 0152   LDLCALC 44 02/20/2018 0152    Physical Exam:    VS:  BP 114/64   Pulse 70   Ht 5' 10"  (1.778 m)   Wt 225 lb (102.1 kg)   SpO2 99%   BMI 32.28 kg/m     Wt Readings from Last 3 Encounters:  02/27/18 225 lb (102.1 kg)  02/23/18 217 lb 2.5 oz (98.5 kg)  01/04/18 225 lb (102.1 kg)     GEN:  Well nourished, well developed in no acute distress HEENT: Normal NECK: No JVD; No carotid bruits LYMPHATICS: No lymphadenopathy CARDIAC: RRR, no murmurs, rubs, gallops RESPIRATORY:  Clear to auscultation without rales, wheezing or rhonchi  ABDOMEN: Soft, non-tender, non-distended MUSCULOSKELETAL:  No edema; No deformity  SKIN: Warm and dry NEUROLOGIC:  Alert and oriented x 3 PSYCHIATRIC:  Normal affect   ASSESSMENT:    1. Paroxysmal atrial fibrillation ([name])   2. CAD in native artery- total LAD, s/p RI DES 06/19/14   3. Essential hypertension   4. DCM (dilated cardiomyopathy) ([place])   5.  Dyslipidemia    PLAN:    In order of problems listed above:  1.  Paroxysmal atrial fibrillation -he had recurrence of atrial fibrillation with RVR after holding amiodarone and beta-blockers for significant  bradycardia caused by hyperkalemia.  He is now status post TEE/DCCV and is maintaining normal sinus rhythm on exam.  He will continue Eliquis 2.5 mg twice daily for at least 4 weeks.  He does not want to stay on Eliquis long-term even though his CHADS2VASC score Is 6 which places him at very high risk for developing cardio embolic events if he is off anticoagulation.  Creatinine was 1.77 on 02/24/2018  2.  ASCAD s/p PCI of the RI in 2015 and known chronically occluded LAD and 50-70% OM2.  Felt not a candidate for CTO of LAD.  He denies any anginal sx. he will continue on aspirin 81 mg daily and statin therapy.  Hemoglobin stable at 9.7.  3.  Hypertension -blood pressures well controlled on exam today.  Is not on any antihypertensive medication.  4.  Dilated cardiomyopathy -not related to tachycardia mediated cardiomyopathy from recent atrial fibrillation with RVR.  Now that he is back in normal sinus rhythm I will repeat a 2D echocardiogram to see if LV function is normal.  If still reduced will need to start heart failure therapy  5.  Hyperlipidemia with LDL goal less than 70.  He will continue on simvastatin 20 mg daily.  LDL was at goal at 44 on 02/20/2018.   Medication Adjustments/Labs and Tests Ordered: Current medicines are reviewed at length with the patient today.  Concerns regarding medicines are outlined above.  No orders of the defined types were placed in this encounter.  No orders of the defined types were placed in this encounter.   Signed, [name], MD  02/27/2018 9:11 AM    [place]

## 2018-02-27 NOTE — Telephone Encounter (Signed)
Move appt out by 1 month

## 2018-02-28 ENCOUNTER — Ambulatory Visit (INDEPENDENT_AMBULATORY_CARE_PROVIDER_SITE_OTHER): Payer: Medicare Other | Admitting: Orthopedic Surgery

## 2018-02-28 NOTE — Telephone Encounter (Signed)
Called and sw [name] [name]  to advise per hospital note pt may wear a regular shoe.

## 2018-03-02 ENCOUNTER — Telehealth: Payer: Self-pay | Admitting: Physician Assistant

## 2018-03-03 ENCOUNTER — Encounter: Payer: Self-pay | Admitting: Physician Assistant

## 2018-03-03 NOTE — Telephone Encounter (Signed)
Mistakenly opened an encounter  Azucena Freed PA-C

## 2018-03-05 DIAGNOSIS — E113293 Type 2 diabetes mellitus with mild nonproliferative diabetic retinopathy without macular edema, bilateral: Secondary | ICD-10-CM | POA: Diagnosis not present

## 2018-03-05 DIAGNOSIS — H43813 Vitreous degeneration, bilateral: Secondary | ICD-10-CM | POA: Diagnosis not present

## 2018-03-05 DIAGNOSIS — H353132 Nonexudative age-related macular degeneration, bilateral, intermediate dry stage: Secondary | ICD-10-CM | POA: Diagnosis not present

## 2018-03-14 ENCOUNTER — Encounter (INDEPENDENT_AMBULATORY_CARE_PROVIDER_SITE_OTHER): Payer: Self-pay | Admitting: Family

## 2018-03-14 ENCOUNTER — Ambulatory Visit (INDEPENDENT_AMBULATORY_CARE_PROVIDER_SITE_OTHER): Payer: Medicare Other | Admitting: Family

## 2018-03-14 ENCOUNTER — Other Ambulatory Visit: Payer: Self-pay

## 2018-03-14 ENCOUNTER — Ambulatory Visit (INDEPENDENT_AMBULATORY_CARE_PROVIDER_SITE_OTHER): Payer: Self-pay

## 2018-03-14 ENCOUNTER — Ambulatory Visit (INDEPENDENT_AMBULATORY_CARE_PROVIDER_SITE_OTHER): Payer: Medicare Other | Admitting: Orthopedic Surgery

## 2018-03-14 ENCOUNTER — Ambulatory Visit (HOSPITAL_COMMUNITY): Payer: Medicare Other | Attending: Cardiovascular Disease

## 2018-03-14 VITALS — Ht 70.0 in | Wt 225.0 lb

## 2018-03-14 DIAGNOSIS — I2510 Atherosclerotic heart disease of native coronary artery without angina pectoris: Secondary | ICD-10-CM

## 2018-03-14 DIAGNOSIS — E785 Hyperlipidemia, unspecified: Secondary | ICD-10-CM | POA: Insufficient documentation

## 2018-03-14 DIAGNOSIS — I4891 Unspecified atrial fibrillation: Secondary | ICD-10-CM | POA: Insufficient documentation

## 2018-03-14 DIAGNOSIS — I10 Essential (primary) hypertension: Secondary | ICD-10-CM | POA: Insufficient documentation

## 2018-03-14 DIAGNOSIS — B351 Tinea unguium: Secondary | ICD-10-CM

## 2018-03-14 DIAGNOSIS — S82852S Displaced trimalleolar fracture of left lower leg, sequela: Secondary | ICD-10-CM

## 2018-03-14 DIAGNOSIS — T847XXS Infection and inflammatory reaction due to other internal orthopedic prosthetic devices, implants and grafts, sequela: Secondary | ICD-10-CM | POA: Diagnosis not present

## 2018-03-14 DIAGNOSIS — I361 Nonrheumatic tricuspid (valve) insufficiency: Secondary | ICD-10-CM | POA: Diagnosis not present

## 2018-03-14 DIAGNOSIS — I420 Dilated cardiomyopathy: Secondary | ICD-10-CM | POA: Insufficient documentation

## 2018-03-14 NOTE — Progress Notes (Signed)
Office Visit Note   Patient: [name] [name]           Date of Birth: 1933-04-25           MRN: [id number] Visit Date: 03/14/2018              Requested by: [name], MD [address] [place], [place] [address] PCP: [name], MD  Chief Complaint  Patient presents with  . Left Ankle - Follow-up    10/25/17 ORIF trimal ankle fx  12/01/17 left ankle hardware removal       HPI: Patient is a 84 year old gentleman status post comminuted bimalleolar left ankle fracture.  Patient did have complications with wound healing and developed infection with the hardware. the hardware was removed. patient presents at this time for follow-up.  Has been full weight bearing in lace up ankle boots with minimal pain. No difficutly. Sister states she is hoping he'll move in to assisted living soon.  Today requests a nail trim.  Assessment & Plan: Visit Diagnoses:  1. Hardware complicating wound infection, sequela     Plan: Patient's ankle is stable. the swelling is improved. No signs of infection.  We will allow the patient to continue weightbearing as tolerated with the ankle high boots on for the left lower extremity per Dr. Gavin Potters instruction. he is to wear his knee-high medical compression stockings at all times.  Follow-Up Instructions: No follow-ups on file.   Ortho Exam  Patient is alert, oriented, no adenopathy, well-dressed, normal affect, normal respiratory effort. Examination there is very minimal swelling there is dry cracked peeling skin from where the patient had swelling the incision is well healed. there is no redness no cellulitis no signs of infection. Has thickened and onychomycotic nails x 10. Is unable to safely trim own nails. Nails were trimmed x 10 without incident.  Imaging: No results found. No images are attached to the encounter.  Labs: Lab Results  Component Value Date   HGBA1C 7.5 (H) 02/20/2018   HGBA1C 6.4 (H) 12/05/2017   HGBA1C 6.5 (H)  01/04/2017   REPTSTATUS 12/09/2017 FINAL 12/08/2017   CULT MULTIPLE SPECIES PRESENT, SUGGEST RECOLLECTION (A) 12/08/2017    @LABSALLVALUES (HGBA1)@  Body mass index is 32.28 kg/m.  Orders:  No orders of the defined types were placed in this encounter.  No orders of the defined types were placed in this encounter.    Procedures: No procedures performed  Clinical Data: No additional findings.  ROS:  All other systems negative, except as noted in the HPI. Review of Systems  Constitutional: Negative for chills and fever.  Musculoskeletal: Positive for arthralgias and joint swelling.  Skin: Negative for wound.    Objective: Vital Signs: Ht 5' 10"  (1.778 m)   Wt 225 lb (102.1 kg)   BMI 32.28 kg/m   Specialty Comments:  No specialty comments available.  PMFS History: Patient Active Problem List   Diagnosis Date Noted  . DCM (dilated cardiomyopathy) ([place]) 02/27/2018  . Pressure injury of skin 02/20/2018  . Hyperkalemia 02/19/2018  . Elevated troponin 02/19/2018  . GERD (gastroesophageal reflux disease) 02/19/2018  . Type II diabetes mellitus with renal manifestations ([place]) 02/19/2018  . HTN (hypertension) 02/19/2018  . AKI (acute kidney injury) ([place])   . Anaphylactic syndrome   . Drug-induced skin rash   . Acute metabolic encephalopathy 60/07/9322  . Allergic reaction caused by a drug 12/05/2017  . Acute renal failure superimposed on stage 3 chronic kidney disease ([place]) 12/05/2017  .  Metabolic acidosis 54/27/0623  . Hardware complicating wound infection ([place]) 11/30/2017  . Trimalleolar fracture of ankle, closed, left, sequela   . Fall 10/24/2017  . Malleolar fracture, left, closed, initial encounter 10/24/2017  . Fibula fracture 10/24/2017  . Diabetes mellitus with diabetic nephropathy without long-term current use of insulin ([place]) 01/05/2017  . CKD (chronic kidney disease), stage III ([place])   . Paroxysmal atrial fibrillation ([name])   . Bleeding gastrointestinal    . Dyslipidemia 10/14/2014  . CAD in native artery- total LAD, s/p RI DES 06/19/14 07/11/2014  . Ulcerative colitis ([place])    Past Medical History:  Diagnosis Date  . Allergic reaction caused by a drug 11/2017  . Anemia   . Atrial flutter ([place])    a. s/p RFCA of counterclockwise cavo-tricuspid isthmus dependent atrial flutter 2009 by Dr. Rayann Heman.  Marland Kitchen CAD (coronary artery disease) 06/2014   a. 06/2014: abnl nuc. Cath: Totally occluded LAD with faint collaterals (not a candidate for CTO PCI), 90% ramus s/p DES, 50-70% OM2.  . Chronic diastolic CHF (congestive heart failure) ([place])    a. 2D Echo 10/23/13: EF 50-55%, basal mid-inf HK, grade 2 DD, mild MR, mod dilated LA, no sig change from prior.  . CKD (chronic kidney disease), stage III ([place])   . Colostomy in place [facility])   . Complication of anesthesia    " during my kidney stone surgery my heart went out of rhythm  . Compression fracture of L1 lumbar vertebra ([name])   . DCM (dilated cardiomyopathy) ([place]) 02/27/2018   EF 35-40% by TEE 02/2018 felt tachycardia mediated  . Diabetes ([place])    TYPE 2   . Dyslipidemia   . Dyspnea   . Frequent PVCs   . GERD (gastroesophageal reflux disease)   . History of blood transfusion   . History of kidney stones   . History of peptic ulcer disease   . HTN (hypertension)   . Hx of acute renal failure 01/02/10-3/24-11   due to hypovolemic shock, gastroenteritis and dehydration,hospitalized . Did requre a few days of dialysis. Cr at discharge was 1.8  . Kidney disease   . Kidney stone may 2009   Right hydronephrosis, S/P stone removal  . Lower back pain   . Macular degeneration   . OA (osteoarthritis) of hip   . Obesity   . Osteopenia   . PAF (paroxysmal atrial fibrillation) ([name])    not on long term anticoagulation due to history of heme positive stools and anemia  . Shingles    episode  . Small bowel obstruction, partial ([place]) 2009   Episode  . Ulcerative colitis ([place])    a. s/p total colectomy  remotely.    Family History  Problem Relation Age of Onset  . Colon cancer Mother     Past Surgical History:  Procedure Laterality Date  . BACK SURGERY    . CARDIAC CATHETERIZATION  06/19/2014  . CARDIOVERSION N/A 02/23/2018   Procedure: CARDIOVERSION;  Surgeon: Dorothy Spark, MD;  Location: [facility] ENDOSCOPY;  Service: Cardiovascular;  Laterality: N/A;  . COLON RESECTION    . COLOSTOMY    . CORONARY ANGIOPLASTY    . CORONARY STENT PLACEMENT  06/19/2014   DES       dr Martinique  . HARDWARE REMOVAL Left 12/01/2017   Procedure: HARDWARE REMOVAL LEFT ANKLE;  Surgeon: Newt Minion, MD;  Location: [place];  Service: Orthopedics;  Laterality: Left;  . HERNIA REPAIR    . LEFT AND RIGHT HEART  CATHETERIZATION WITH CORONARY ANGIOGRAM N/A 06/19/2014   Procedure: LEFT AND RIGHT HEART CATHETERIZATION WITH CORONARY ANGIOGRAM;  Surgeon: Peter M Martinique, MD;  Location: [facility] CATH LAB;  Service: Cardiovascular;  Laterality: N/A;  . ORIF ANKLE FRACTURE Left 10/25/2017   Procedure: OPEN REDUCTION INTERNAL FIXATION (ORIF) LEFT ANKLE FRACTURE;  Surgeon: Newt Minion, MD;  Location: [place];  Service: Orthopedics;  Laterality: Left;  . TEE WITHOUT CARDIOVERSION N/A 02/23/2018   Procedure: TRANSESOPHAGEAL ECHOCARDIOGRAM (TEE);  Surgeon: Dorothy Spark, MD;  Location: [facility] ENDOSCOPY;  Service: Cardiovascular;  Laterality: N/A;  . TONSILLECTOMY     Social History   Occupational History  . Not on file  Tobacco Use  . Smoking status: Never Smoker  . Smokeless tobacco: Never Used  Substance and Sexual Activity  . Alcohol use: No  . Drug use: No  . Sexual activity: Not on file

## 2018-03-15 ENCOUNTER — Telehealth: Payer: Self-pay

## 2018-03-15 DIAGNOSIS — I272 Pulmonary hypertension, unspecified: Secondary | ICD-10-CM

## 2018-03-15 NOTE — Telephone Encounter (Signed)
Notes recorded by Teressa Senter, RN on 03/15/2018 at 8:21 AM EDT Pt made aware echo results. Echo showed mildly reduced LVF with EF 40-45% with increased stiffness of heart muscle, trivial AR, severe LAE mild TR and moderate pulmonary HTN - compared to prior echo, LVF appears similar but PASP has increased - please repeat echo in 1 year for pulmonary HTN.  Pt informed Dr. Radford Pax recommends repeating echo in 1 year to assess pulmonary HTN. Pt stated understanding and thankful for the call. Echo ordered to be scheduled in 1 year  Notes recorded by Sueanne Margarita, MD on 03/14/2018 at 4:20 PM EDT Echo showed mildly reduced LVF with EF 40-45% with increased stiffness of heart muscle, trivial AR, severe LAE mild TR and moderate pulmonary HTN - compared to prior echo, LVF appears similar but PASP has increased - please repeat echo in 1 year for pulmonary HTN

## 2018-03-21 DIAGNOSIS — S82852D Displaced trimalleolar fracture of left lower leg, subsequent encounter for closed fracture with routine healing: Secondary | ICD-10-CM | POA: Diagnosis not present

## 2018-03-21 DIAGNOSIS — I5043 Acute on chronic combined systolic (congestive) and diastolic (congestive) heart failure: Secondary | ICD-10-CM | POA: Diagnosis not present

## 2018-03-21 DIAGNOSIS — R339 Retention of urine, unspecified: Secondary | ICD-10-CM | POA: Diagnosis not present

## 2018-03-21 DIAGNOSIS — S61412D Laceration without foreign body of left hand, subsequent encounter: Secondary | ICD-10-CM | POA: Diagnosis not present

## 2018-03-21 DIAGNOSIS — I480 Paroxysmal atrial fibrillation: Secondary | ICD-10-CM | POA: Diagnosis not present

## 2018-03-21 DIAGNOSIS — I2510 Atherosclerotic heart disease of native coronary artery without angina pectoris: Secondary | ICD-10-CM | POA: Diagnosis not present

## 2018-03-21 DIAGNOSIS — Z9181 History of falling: Secondary | ICD-10-CM | POA: Diagnosis not present

## 2018-03-21 DIAGNOSIS — Z7901 Long term (current) use of anticoagulants: Secondary | ICD-10-CM | POA: Diagnosis not present

## 2018-03-21 DIAGNOSIS — N183 Chronic kidney disease, stage 3 (moderate): Secondary | ICD-10-CM | POA: Diagnosis not present

## 2018-03-21 DIAGNOSIS — E1142 Type 2 diabetes mellitus with diabetic polyneuropathy: Secondary | ICD-10-CM | POA: Diagnosis not present

## 2018-03-21 DIAGNOSIS — N401 Enlarged prostate with lower urinary tract symptoms: Secondary | ICD-10-CM | POA: Diagnosis not present

## 2018-03-21 DIAGNOSIS — Z933 Colostomy status: Secondary | ICD-10-CM | POA: Diagnosis not present

## 2018-03-21 DIAGNOSIS — W19XXXD Unspecified fall, subsequent encounter: Secondary | ICD-10-CM | POA: Diagnosis not present

## 2018-03-21 DIAGNOSIS — M25552 Pain in left hip: Secondary | ICD-10-CM | POA: Diagnosis not present

## 2018-03-21 DIAGNOSIS — I130 Hypertensive heart and chronic kidney disease with heart failure and stage 1 through stage 4 chronic kidney disease, or unspecified chronic kidney disease: Secondary | ICD-10-CM | POA: Diagnosis not present

## 2018-03-21 DIAGNOSIS — D631 Anemia in chronic kidney disease: Secondary | ICD-10-CM | POA: Diagnosis not present

## 2018-03-21 DIAGNOSIS — E1122 Type 2 diabetes mellitus with diabetic chronic kidney disease: Secondary | ICD-10-CM | POA: Diagnosis not present

## 2018-03-21 DIAGNOSIS — K5190 Ulcerative colitis, unspecified, without complications: Secondary | ICD-10-CM | POA: Diagnosis not present

## 2018-03-23 DIAGNOSIS — R601 Generalized edema: Secondary | ICD-10-CM | POA: Diagnosis not present

## 2018-03-23 DIAGNOSIS — R2689 Other abnormalities of gait and mobility: Secondary | ICD-10-CM | POA: Diagnosis not present

## 2018-03-23 DIAGNOSIS — I2510 Atherosclerotic heart disease of native coronary artery without angina pectoris: Secondary | ICD-10-CM | POA: Diagnosis not present

## 2018-03-23 DIAGNOSIS — S82852D Displaced trimalleolar fracture of left lower leg, subsequent encounter for closed fracture with routine healing: Secondary | ICD-10-CM | POA: Diagnosis not present

## 2018-03-23 DIAGNOSIS — I130 Hypertensive heart and chronic kidney disease with heart failure and stage 1 through stage 4 chronic kidney disease, or unspecified chronic kidney disease: Secondary | ICD-10-CM | POA: Diagnosis not present

## 2018-03-23 DIAGNOSIS — I5043 Acute on chronic combined systolic (congestive) and diastolic (congestive) heart failure: Secondary | ICD-10-CM | POA: Diagnosis not present

## 2018-03-23 DIAGNOSIS — E1142 Type 2 diabetes mellitus with diabetic polyneuropathy: Secondary | ICD-10-CM | POA: Diagnosis not present

## 2018-03-23 DIAGNOSIS — E1122 Type 2 diabetes mellitus with diabetic chronic kidney disease: Secondary | ICD-10-CM | POA: Diagnosis not present

## 2018-03-23 DIAGNOSIS — N183 Chronic kidney disease, stage 3 (moderate): Secondary | ICD-10-CM | POA: Diagnosis not present

## 2018-03-26 ENCOUNTER — Encounter (INDEPENDENT_AMBULATORY_CARE_PROVIDER_SITE_OTHER): Payer: Self-pay | Admitting: Family

## 2018-03-26 ENCOUNTER — Other Ambulatory Visit: Payer: Self-pay | Admitting: Internal Medicine

## 2018-03-26 ENCOUNTER — Ambulatory Visit
Admission: RE | Admit: 2018-03-26 | Discharge: 2018-03-26 | Disposition: A | Discharge status: Home / Self Care | Payer: Medicare Other | Source: Ambulatory Visit | Attending: Internal Medicine | Admitting: Internal Medicine

## 2018-03-26 ENCOUNTER — Encounter (HOSPITAL_COMMUNITY): Payer: Self-pay | Admitting: Emergency Medicine

## 2018-03-26 ENCOUNTER — Emergency Department (HOSPITAL_COMMUNITY): Payer: Medicare Other

## 2018-03-26 ENCOUNTER — Other Ambulatory Visit: Payer: Self-pay

## 2018-03-26 ENCOUNTER — Inpatient Hospital Stay (HOSPITAL_COMMUNITY)
Admission: EM | Admit: 2018-03-26 | Discharge: 2018-03-29 | DRG: 535 | Disposition: A | Discharge status: Home Health | Payer: Medicare Other | Source: Skilled Nursing Facility | Attending: Internal Medicine | Admitting: Internal Medicine

## 2018-03-26 DIAGNOSIS — W010XXA Fall on same level from slipping, tripping and stumbling without subsequent striking against object, initial encounter: Secondary | ICD-10-CM | POA: Diagnosis present

## 2018-03-26 DIAGNOSIS — I509 Heart failure, unspecified: Secondary | ICD-10-CM | POA: Diagnosis not present

## 2018-03-26 DIAGNOSIS — D649 Anemia, unspecified: Secondary | ICD-10-CM | POA: Diagnosis present

## 2018-03-26 DIAGNOSIS — R52 Pain, unspecified: Secondary | ICD-10-CM | POA: Diagnosis not present

## 2018-03-26 DIAGNOSIS — E785 Hyperlipidemia, unspecified: Secondary | ICD-10-CM | POA: Diagnosis present

## 2018-03-26 DIAGNOSIS — D631 Anemia in chronic kidney disease: Secondary | ICD-10-CM | POA: Diagnosis present

## 2018-03-26 DIAGNOSIS — K219 Gastro-esophageal reflux disease without esophagitis: Secondary | ICD-10-CM | POA: Diagnosis present

## 2018-03-26 DIAGNOSIS — I480 Paroxysmal atrial fibrillation: Secondary | ICD-10-CM | POA: Diagnosis not present

## 2018-03-26 DIAGNOSIS — Z6833 Body mass index (BMI) 33.0-33.9, adult: Secondary | ICD-10-CM

## 2018-03-26 DIAGNOSIS — E43 Unspecified severe protein-calorie malnutrition: Secondary | ICD-10-CM | POA: Diagnosis present

## 2018-03-26 DIAGNOSIS — S72142A Displaced intertrochanteric fracture of left femur, initial encounter for closed fracture: Secondary | ICD-10-CM

## 2018-03-26 DIAGNOSIS — Y92098 Other place in other non-institutional residence as the place of occurrence of the external cause: Secondary | ICD-10-CM | POA: Diagnosis not present

## 2018-03-26 DIAGNOSIS — N183 Chronic kidney disease, stage 3 (moderate): Secondary | ICD-10-CM | POA: Diagnosis not present

## 2018-03-26 DIAGNOSIS — E1129 Type 2 diabetes mellitus with other diabetic kidney complication: Secondary | ICD-10-CM | POA: Diagnosis present

## 2018-03-26 DIAGNOSIS — Z9049 Acquired absence of other specified parts of digestive tract: Secondary | ICD-10-CM | POA: Diagnosis not present

## 2018-03-26 DIAGNOSIS — S329XXD Fracture of unspecified parts of lumbosacral spine and pelvis, subsequent encounter for fracture with routine healing: Secondary | ICD-10-CM | POA: Diagnosis not present

## 2018-03-26 DIAGNOSIS — S72001A Fracture of unspecified part of neck of right femur, initial encounter for closed fracture: Secondary | ICD-10-CM

## 2018-03-26 DIAGNOSIS — S79912A Unspecified injury of left hip, initial encounter: Secondary | ICD-10-CM | POA: Diagnosis not present

## 2018-03-26 DIAGNOSIS — W19XXXA Unspecified fall, initial encounter: Secondary | ICD-10-CM | POA: Diagnosis not present

## 2018-03-26 DIAGNOSIS — I5042 Chronic combined systolic (congestive) and diastolic (congestive) heart failure: Secondary | ICD-10-CM | POA: Diagnosis present

## 2018-03-26 DIAGNOSIS — M25552 Pain in left hip: Secondary | ICD-10-CM

## 2018-03-26 DIAGNOSIS — J9811 Atelectasis: Secondary | ICD-10-CM | POA: Diagnosis not present

## 2018-03-26 DIAGNOSIS — Z933 Colostomy status: Secondary | ICD-10-CM | POA: Diagnosis not present

## 2018-03-26 DIAGNOSIS — E1122 Type 2 diabetes mellitus with diabetic chronic kidney disease: Secondary | ICD-10-CM | POA: Diagnosis not present

## 2018-03-26 DIAGNOSIS — I2510 Atherosclerotic heart disease of native coronary artery without angina pectoris: Secondary | ICD-10-CM | POA: Diagnosis present

## 2018-03-26 DIAGNOSIS — Z7901 Long term (current) use of anticoagulants: Secondary | ICD-10-CM

## 2018-03-26 DIAGNOSIS — N184 Chronic kidney disease, stage 4 (severe): Secondary | ICD-10-CM | POA: Diagnosis not present

## 2018-03-26 DIAGNOSIS — M2550 Pain in unspecified joint: Secondary | ICD-10-CM | POA: Diagnosis not present

## 2018-03-26 DIAGNOSIS — S32512A Fracture of superior rim of left pubis, initial encounter for closed fracture: Principal | ICD-10-CM | POA: Diagnosis present

## 2018-03-26 DIAGNOSIS — I130 Hypertensive heart and chronic kidney disease with heart failure and stage 1 through stage 4 chronic kidney disease, or unspecified chronic kidney disease: Secondary | ICD-10-CM | POA: Diagnosis present

## 2018-03-26 DIAGNOSIS — N400 Enlarged prostate without lower urinary tract symptoms: Secondary | ICD-10-CM | POA: Diagnosis present

## 2018-03-26 DIAGNOSIS — N179 Acute kidney failure, unspecified: Secondary | ICD-10-CM | POA: Diagnosis present

## 2018-03-26 DIAGNOSIS — S329XXA Fracture of unspecified parts of lumbosacral spine and pelvis, initial encounter for closed fracture: Secondary | ICD-10-CM | POA: Diagnosis present

## 2018-03-26 DIAGNOSIS — R102 Pelvic and perineal pain: Secondary | ICD-10-CM | POA: Diagnosis not present

## 2018-03-26 DIAGNOSIS — M879 Osteonecrosis, unspecified: Secondary | ICD-10-CM | POA: Diagnosis present

## 2018-03-26 DIAGNOSIS — I5022 Chronic systolic (congestive) heart failure: Secondary | ICD-10-CM | POA: Diagnosis not present

## 2018-03-26 DIAGNOSIS — I10 Essential (primary) hypertension: Secondary | ICD-10-CM | POA: Diagnosis not present

## 2018-03-26 DIAGNOSIS — Z955 Presence of coronary angioplasty implant and graft: Secondary | ICD-10-CM

## 2018-03-26 DIAGNOSIS — Z01810 Encounter for preprocedural cardiovascular examination: Secondary | ICD-10-CM | POA: Diagnosis not present

## 2018-03-26 DIAGNOSIS — Z66 Do not resuscitate: Secondary | ICD-10-CM | POA: Diagnosis present

## 2018-03-26 DIAGNOSIS — S32301A Unspecified fracture of right ilium, initial encounter for closed fracture: Secondary | ICD-10-CM | POA: Diagnosis not present

## 2018-03-26 DIAGNOSIS — R609 Edema, unspecified: Secondary | ICD-10-CM | POA: Diagnosis not present

## 2018-03-26 DIAGNOSIS — H3530 Unspecified macular degeneration: Secondary | ICD-10-CM | POA: Diagnosis present

## 2018-03-26 DIAGNOSIS — S72145A Nondisplaced intertrochanteric fracture of left femur, initial encounter for closed fracture: Secondary | ICD-10-CM | POA: Diagnosis not present

## 2018-03-26 DIAGNOSIS — E1142 Type 2 diabetes mellitus with diabetic polyneuropathy: Secondary | ICD-10-CM | POA: Diagnosis present

## 2018-03-26 DIAGNOSIS — S72143A Displaced intertrochanteric fracture of unspecified femur, initial encounter for closed fracture: Secondary | ICD-10-CM | POA: Diagnosis present

## 2018-03-26 DIAGNOSIS — Z79899 Other long term (current) drug therapy: Secondary | ICD-10-CM

## 2018-03-26 DIAGNOSIS — S32592A Other specified fracture of left pubis, initial encounter for closed fracture: Secondary | ICD-10-CM

## 2018-03-26 DIAGNOSIS — K5190 Ulcerative colitis, unspecified, without complications: Secondary | ICD-10-CM | POA: Diagnosis not present

## 2018-03-26 DIAGNOSIS — S82852D Displaced trimalleolar fracture of left lower leg, subsequent encounter for closed fracture with routine healing: Secondary | ICD-10-CM | POA: Diagnosis not present

## 2018-03-26 DIAGNOSIS — S32509S Unspecified fracture of unspecified pubis, sequela: Secondary | ICD-10-CM | POA: Diagnosis not present

## 2018-03-26 DIAGNOSIS — Z7982 Long term (current) use of aspirin: Secondary | ICD-10-CM

## 2018-03-26 DIAGNOSIS — I420 Dilated cardiomyopathy: Secondary | ICD-10-CM | POA: Diagnosis present

## 2018-03-26 DIAGNOSIS — Z7401 Bed confinement status: Secondary | ICD-10-CM | POA: Diagnosis not present

## 2018-03-26 DIAGNOSIS — I4891 Unspecified atrial fibrillation: Secondary | ICD-10-CM | POA: Diagnosis not present

## 2018-03-26 DIAGNOSIS — I5043 Acute on chronic combined systolic (congestive) and diastolic (congestive) heart failure: Secondary | ICD-10-CM | POA: Diagnosis not present

## 2018-03-26 LAB — BASIC METABOLIC PANEL
Anion gap: 9 (ref 5–15)
BUN: 49 mg/dL — ABNORMAL HIGH (ref 6–20)
CO2: 22 mmol/L (ref 22–32)
Calcium: 8.3 mg/dL — ABNORMAL LOW (ref 8.9–10.3)
Chloride: 106 mmol/L (ref 101–111)
Creatinine, Ser: 2.05 mg/dL — ABNORMAL HIGH (ref 0.61–1.24)
GFR calc Af Amer: 33 mL/min — ABNORMAL LOW (ref >60)
GFR calc non Af Amer: 28 mL/min — ABNORMAL LOW (ref >60)
Glucose, Bld: 138 mg/dL — ABNORMAL HIGH (ref 65–99)
Potassium: 4.4 mmol/L (ref 3.5–5.1)
Sodium: 137 mmol/L (ref 135–145)

## 2018-03-26 LAB — CBC WITH DIFFERENTIAL/PLATELET
Abs Immature Granulocytes: 0 10*3/uL (ref 0.0–0.1)
Basophils Absolute: 0 10*3/uL (ref 0.0–0.1)
Basophils Relative: 0 %
Eosinophils Absolute: 0.3 10*3/uL (ref 0.0–0.7)
Eosinophils Relative: 4 %
HCT: 30.7 % — ABNORMAL LOW (ref 39.0–52.0)
Hemoglobin: 9.2 g/dL — ABNORMAL LOW (ref 13.0–17.0)
Immature Granulocytes: 1 %
Lymphocytes Relative: 15 %
Lymphs Abs: 1.3 10*3/uL (ref 0.7–4.0)
MCH: 29 pg (ref 26.0–34.0)
MCHC: 30 g/dL (ref 30.0–36.0)
MCV: 96.8 fL (ref 78.0–100.0)
Monocytes Absolute: 0.8 10*3/uL (ref 0.1–1.0)
Monocytes Relative: 10 %
Neutro Abs: 5.8 10*3/uL (ref 1.7–7.7)
Neutrophils Relative %: 70 %
Platelets: 223 10*3/uL (ref 150–400)
RBC: 3.17 MIL/uL — ABNORMAL LOW (ref 4.22–5.81)
RDW: 16.1 % — ABNORMAL HIGH (ref 11.5–15.5)
WBC: 8.2 10*3/uL (ref 4.0–10.5)

## 2018-03-26 MED ORDER — ADULT MULTIVITAMIN W/MINERALS CH
1.0000 | ORAL_TABLET | Freq: Every day | ORAL | Status: DC
  Administered 2018-03-27 – 2018-03-29 (×3): 1 via ORAL
  Filled 2018-03-26 (×3): qty 1

## 2018-03-26 MED ORDER — LINAGLIPTIN 5 MG PO TABS
5.0000 mg | ORAL_TABLET | Freq: Every day | ORAL | Status: DC
  Administered 2018-03-27 – 2018-03-29 (×3): 5 mg via ORAL
  Filled 2018-03-26 (×3): qty 1

## 2018-03-26 MED ORDER — HYDROMORPHONE HCL 2 MG/ML IJ SOLN: 0.5000 mg | INTRAMUSCULAR | Status: DC | PRN

## 2018-03-26 MED ORDER — ACETAMINOPHEN 325 MG PO TABS
650.0000 mg | ORAL_TABLET | Freq: Four times a day (QID) | ORAL | Status: DC | PRN
  Administered 2018-03-29 (×2): 650 mg via ORAL
  Filled 2018-03-26 (×2): qty 2

## 2018-03-26 MED ORDER — SODIUM CHLORIDE 0.9 % IV SOLN: 250.0000 mL | INTRAVENOUS | Status: DC | PRN

## 2018-03-26 MED ORDER — AMIODARONE HCL 100 MG PO TABS
200.0000 mg | ORAL_TABLET | Freq: Every day | ORAL | Status: DC
  Administered 2018-03-27 – 2018-03-29 (×3): 200 mg via ORAL
  Filled 2018-03-26 (×3): qty 2

## 2018-03-26 MED ORDER — DOCUSATE SODIUM 100 MG PO CAPS
100.0000 mg | ORAL_CAPSULE | Freq: Two times a day (BID) | ORAL | Status: DC
  Administered 2018-03-27 – 2018-03-29 (×5): 100 mg via ORAL
  Filled 2018-03-26 (×5): qty 1

## 2018-03-26 MED ORDER — PRO-STAT SUGAR FREE PO LIQD
30.0000 mL | Freq: Every day | ORAL | Status: DC
  Administered 2018-03-27 – 2018-03-29 (×3): 30 mL via ORAL
  Filled 2018-03-26 (×3): qty 30

## 2018-03-26 MED ORDER — PANTOPRAZOLE SODIUM 40 MG PO TBEC
40.0000 mg | DELAYED_RELEASE_TABLET | Freq: Every day | ORAL | Status: DC
  Administered 2018-03-27 – 2018-03-29 (×3): 40 mg via ORAL
  Filled 2018-03-26 (×3): qty 1

## 2018-03-26 MED ORDER — SODIUM CHLORIDE 0.9% FLUSH
3.0000 mL | Freq: Two times a day (BID) | INTRAVENOUS | Status: DC
  Administered 2018-03-27 – 2018-03-29 (×5): 3 mL via INTRAVENOUS

## 2018-03-26 MED ORDER — ACETAMINOPHEN 650 MG RE SUPP: 650.0000 mg | Freq: Four times a day (QID) | RECTAL | Status: DC | PRN

## 2018-03-26 MED ORDER — INSULIN ASPART 100 UNIT/ML ~~LOC~~ SOLN
0.0000 [IU] | Freq: Three times a day (TID) | SUBCUTANEOUS | Status: DC
  Administered 2018-03-27 – 2018-03-28 (×4): 1 [IU] via SUBCUTANEOUS
  Administered 2018-03-28 – 2018-03-29 (×2): 2 [IU] via SUBCUTANEOUS
  Administered 2018-03-29: 1 [IU] via SUBCUTANEOUS

## 2018-03-26 MED ORDER — INSULIN ASPART 100 UNIT/ML ~~LOC~~ SOLN: 0.0000 [IU] | Freq: Every day | SUBCUTANEOUS | Status: DC

## 2018-03-26 MED ORDER — PIOGLITAZONE HCL 30 MG PO TABS: 30.0000 mg | ORAL_TABLET | Freq: Every day | ORAL | Status: DC

## 2018-03-26 MED ORDER — SODIUM CHLORIDE 0.9% FLUSH: 3.0000 mL | INTRAVENOUS | Status: DC | PRN

## 2018-03-26 NOTE — ED Provider Notes (Addendum)
[facility] EMERGENCY DEPARTMENT Provider Note   CSN: [id number] Arrival date & time: 03/26/18  9983     History   Chief Complaint Chief Complaint  Patient presents with  . Hip Injury    HPI [name] [name] is a 84 y.o. male.  HPI  84 year old male with history of diastolic CHF, CKD, atrial flutter on Eliquis, metabolic syndrome comes in with chief complaint of hip pain.  Patient states that he had a mechanical fall 1 week ago.  He has been ambulating since then, with pain over his left hip.  Patient denies pain elsewhere.  He has no history of hip replacement.  Past Medical History:  Diagnosis Date  . Allergic reaction caused by a drug 11/2017  . Anemia   . Atrial flutter ([place])    a. s/p RFCA of counterclockwise cavo-tricuspid isthmus dependent atrial flutter 2009 by Dr. Rayann Heman.  Marland Kitchen CAD (coronary artery disease) 06/2014   a. 06/2014: abnl nuc. Cath: Totally occluded LAD with faint collaterals (not a candidate for CTO PCI), 90% ramus s/p DES, 50-70% OM2.  . Chronic diastolic CHF (congestive heart failure) ([place])    a. 2D Echo 10/23/13: EF 50-55%, basal mid-inf HK, grade 2 DD, mild MR, mod dilated LA, no sig change from prior.  . CKD (chronic kidney disease), stage III ([place])   . Colostomy in place [facility])   . Complication of anesthesia    " during my kidney stone surgery my heart went out of rhythm  . Compression fracture of L1 lumbar vertebra ([name])   . DCM (dilated cardiomyopathy) ([place]) 02/27/2018   EF 35-40% by TEE 02/2018 felt tachycardia mediated  . Diabetes ([place])    TYPE 2   . Dyslipidemia   . Dyspnea   . Frequent PVCs   . GERD (gastroesophageal reflux disease)   . History of blood transfusion   . History of kidney stones   . History of peptic ulcer disease   . HTN (hypertension)   . Hx of acute renal failure 01/02/10-3/24-11   due to hypovolemic shock, gastroenteritis and dehydration,hospitalized . Did requre a few days of dialysis. Cr at discharge was  1.8  . Kidney disease   . Kidney stone may 2009   Right hydronephrosis, S/P stone removal  . Lower back pain   . Macular degeneration   . OA (osteoarthritis) of hip   . Obesity   . Osteopenia   . PAF (paroxysmal atrial fibrillation) ([name])    not on long term anticoagulation due to history of heme positive stools and anemia  . Shingles    episode  . Small bowel obstruction, partial ([place]) 2009   Episode  . Ulcerative colitis ([place])    a. s/p total colectomy remotely.    Patient Active Problem List   Diagnosis Date Noted  . DCM (dilated cardiomyopathy) ([place]) 02/27/2018  . Pressure injury of skin 02/20/2018  . Hyperkalemia 02/19/2018  . Elevated troponin 02/19/2018  . GERD (gastroesophageal reflux disease) 02/19/2018  . Type II diabetes mellitus with renal manifestations ([place]) 02/19/2018  . HTN (hypertension) 02/19/2018  . AKI (acute kidney injury) ([place])   . Anaphylactic syndrome   . Drug-induced skin rash   . Acute metabolic encephalopathy 38/25/0539  . Allergic reaction caused by a drug 12/05/2017  . Acute renal failure superimposed on stage 3 chronic kidney disease ([place]) 12/05/2017  . Metabolic acidosis 76/73/4193  . Hardware complicating wound infection ([place]) 11/30/2017  . Trimalleolar fracture of ankle, closed, left, sequela   .  Fall 10/24/2017  . Malleolar fracture, left, closed, initial encounter 10/24/2017  . Fibula fracture 10/24/2017  . Diabetes mellitus with diabetic nephropathy without long-term current use of insulin ([place]) 01/05/2017  . CKD (chronic kidney disease), stage III ([place])   . Paroxysmal atrial fibrillation ([name])   . Bleeding gastrointestinal   . Dyslipidemia 10/14/2014  . CAD in native artery- total LAD, s/p RI DES 06/19/14 07/11/2014  . Ulcerative colitis [facility])     Past Surgical History:  Procedure Laterality Date  . BACK SURGERY    . CARDIAC CATHETERIZATION  06/19/2014  . CARDIOVERSION N/A 02/23/2018   Procedure: CARDIOVERSION;  Surgeon: Dorothy Spark, MD;  Location[facility] ENDOSCOPY;  Service: Cardiovascular;  Laterality: N/A;  . COLON RESECTION    . COLOSTOMY    . CORONARY ANGIOPLASTY    . CORONARY STENT PLACEMENT  06/19/2014   DES       dr Martinique  . HARDWARE REMOVAL Left 12/01/2017   Procedure: HARDWARE REMOVAL LEFT ANKLE;  Surgeon: Newt Minion, MD;  Location: [facility];  Service: Orthopedics;  Laterality: Left;  . HERNIA REPAIR    . LEFT AND RIGHT HEART CATHETERIZATION WITH CORONARY ANGIOGRAM N/A 06/19/2014   Procedure: LEFT AND RIGHT HEART CATHETERIZATION WITH CORONARY ANGIOGRAM;  Surgeon: Peter M Martinique, MD;  Location: [facility] CATH LAB;  Service: Cardiovascular;  Laterality: N/A;  . ORIF ANKLE FRACTURE Left 10/25/2017   Procedure: OPEN REDUCTION INTERNAL FIXATION (ORIF) LEFT ANKLE FRACTURE;  Surgeon: Newt Minion, MD;  Location: [facility];  Service: Orthopedics;  Laterality: Left;  . TEE WITHOUT CARDIOVERSION N/A 02/23/2018   Procedure: TRANSESOPHAGEAL ECHOCARDIOGRAM (TEE);  Surgeon: Dorothy Spark, MD;  Location: [facility] ENDOSCOPY;  Service: Cardiovascular;  Laterality: N/A;  . TONSILLECTOMY          Home Medications    Prior to Admission medications   Medication Sig Start Date End Date Taking? Authorizing Provider  acetaminophen (TYLENOL) 500 MG tablet Take 1 tablet (500 mg total) by mouth every 6 (six) hours as needed (for pain or headaches). Available over the counter 01/08/17   Hongalgi, Lenis Dickinson, MD  Amino Acids-Protein Hydrolys (FEEDING SUPPLEMENT, PRO-STAT SUGAR FREE 64,) LIQD Take 30 mLs by mouth daily.    [provider]  amiodarone (PACERONE) 200 MG tablet Take 1 tablet (200 mg total) by mouth daily. 07/03/17   Sueanne Margarita, MD  apixaban (ELIQUIS) 2.5 MG TABS tablet Take 1 tablet (2.5 mg total) by mouth 2 (two) times daily. 02/24/18   Georgette Shell, MD  aspirin EC 81 MG EC tablet Take 1 tablet (81 mg total) by mouth daily. 02/25/18   Georgette Shell, MD  calcium carbonate (OS-CAL) 600 MG TABS tablet Take  600 mg by mouth daily with breakfast.    [provider]  cholecalciferol (VITAMIN D) 1000 units tablet Take 3,000 Units by mouth daily.    [provider]  docusate sodium (COLACE) 100 MG capsule Take 1 capsule (100 mg total) by mouth 2 (two) times daily. 10/27/17   Doreatha Lew, MD  furosemide (LASIX) 40 MG tablet Take 1 tablet (40 mg total) by mouth daily. 02/25/18   Georgette Shell, MD  hydrocortisone cream 1 % Apply 1 application topically 3 (three) times daily as needed for itching (skin irritation). 02/24/18   Georgette Shell, MD  Multiple Vitamin (MULTIVITAMIN WITH MINERALS) TABS tablet Take 1 tablet by mouth daily.    [provider]  omeprazole (PRILOSEC) 20 MG capsule  Take 20 mg by mouth every evening.    [provider]  oxyCODONE-acetaminophen (PERCOCET) 10-325 MG tablet Take 1 tablet by mouth every 4 (four) hours as needed for pain. 10/27/17   Doreatha Lew, MD  pioglitazone (ACTOS) 30 MG tablet Take 1 tablet (30 mg total) by mouth daily. 11/05/14   Dunn, Nedra Hai, PA-C  promethazine (PHENERGAN) 25 MG tablet Take 25 mg by mouth 3 (three) times daily as needed for nausea or vomiting.    [provider]  saxagliptin HCl (ONGLYZA) 5 MG TABS tablet Take 0.5 tablets (2.5 mg total) by mouth daily. 12/13/17   Velvet Bathe, MD  senna-docusate (SENNA S) 8.6-50 MG tablet Take 1 tablet by mouth daily as needed for mild constipation.    [provider]  simvastatin (ZOCOR) 20 MG tablet Take 20 mg by mouth at bedtime.     [provider]  tamsulosin (FLOMAX) 0.4 MG CAPS capsule Take 1 capsule (0.4 mg total) by mouth daily after supper. 10/27/14   Florencia Reasons, MD  UNABLE TO FIND Take 120 mLs by mouth daily. Med Name: Med pass    [provider]    Family History Family History  Problem Relation Age of Onset  . Colon cancer Mother     Social History Social History   Tobacco Use  . Smoking status: Never  Smoker  . Smokeless tobacco: Never Used  Substance Use Topics  . Alcohol use: No  . Drug use: No     Allergies   Brilinta [ticagrelor]; Clindamycin/lincomycin; Doxycycline; Penicillins; and Sulfa antibiotics   Review of Systems Review of Systems  Constitutional: Positive for activity change.  Respiratory: Negative for shortness of breath.   Cardiovascular: Negative for chest pain.  Gastrointestinal: Negative for abdominal pain, nausea and vomiting.  Musculoskeletal: Positive for arthralgias and myalgias.  Hematological: Bruises/bleeds easily.  All other systems reviewed and are negative.    Physical Exam Updated Vital Signs BP (!) 123/43 (BP Location: Left Arm)   Pulse 66   Temp 98.5 F (36.9 C) (Oral)   Ht 5' 10"  (1.778 m)   Wt 105.2 kg (232 lb)   SpO2 95%   BMI 33.29 kg/m   Physical Exam  Constitutional: He is oriented to person, place, and time. He appears well-developed.  HENT:  Head: Atraumatic.  Neck: Neck supple.  Cardiovascular: Normal rate and intact distal pulses.  Pulmonary/Chest: Effort normal.  Abdominal: Soft.  Musculoskeletal:  Tenderness to deep palpation of the left hip  Neurological: He is alert and oriented to person, place, and time.  Skin: Skin is warm.  Nursing note and vitals reviewed.    ED Treatments / Results  Labs (all labs ordered are listed, but only abnormal results are displayed) Labs Reviewed  BASIC METABOLIC PANEL  CBC WITH DIFFERENTIAL/PLATELET    EKG EKG Interpretation  Date/Time:  Monday March 26 2018 20:38:38 EDT Ventricular Rate:  78 PR Interval:    QRS Duration: 132 QT Interval:  430 QTC Calculation: 490 R Axis:   -61 Text Interpretation:  Undetermined rhythm Left axis deviation Non-specific intra-ventricular conduction block Abnormal ECG Atrial fibrillation Confirmed by Varney Biles [phone]) on 03/26/2018 9:03:00 PM   Radiology Dg Hip Unilat With Pelvis 2-3 Views Left  Result Date: 03/26/2018 CLINICAL  DATA:  [place] 1 week ago, LEFT hip pain. EXAM: DG HIP (WITH OR WITHOUT PELVIS) 2-3V LEFT COMPARISON:  CT abdomen and pelvis April 8 1,019 FINDINGS: Linear lucency intertrochanteric femur. Femoral head is intact and  located. Osteopenia without destructive bony lesion. Moderate vascular calcification. Surgical clips project in the pelvis. IMPRESSION: Nondisplaced LEFT femur intertrochanteric fracture versus neurovascular channel. These results will be called to the ordering clinician or representative by the Radiologist Assistant, and communication documented in the PACS or zVision Dashboard. Electronically Signed   By: Elon Alas M.D.   On: 03/26/2018 14:40    Procedures Procedures (including critical care time)  Medications Ordered in ED Medications - No data to display   Initial Impression / Assessment and Plan / ED Course  I have reviewed the triage vital signs and the nursing notes.  Pertinent labs & imaging results that were available during my care of the patient were reviewed by me and considered in my medical decision making (see chart for details).     84 year old male comes in with chief complaint of fall. Fall was last week, he has been walking with pain -an outpatient x-ray shows left-sided intertrochanteric fracture.  Orthopedic service has been consulted.  9:06 PM I spoke with Dr. Alvan Dame, who recommends that patient be admitted by medicine.  Since patient is on Eliquis he does not need to be n.p.o. Tonight.  They will see the patient tomorrow morning.   Final Clinical Impressions(s) / ED Diagnoses   Final diagnoses:  Closed fracture of right hip, initial encounter [facility])    ED Discharge Orders    None       Varney Biles, MD 03/26/18 2034    Varney Biles, MD 03/26/18 2106

## 2018-03-26 NOTE — ED Notes (Signed)
Pt sister Wallis Bamberg [phone] Pt sister Azucena Freed [phone]. Please call if you need any information about pt

## 2018-03-26 NOTE — H&P (Signed)
TRH H&P   Patient Demographics:    [name] [name], is a 84 y.o. male  MRN: [id number]   DOB - 1933-09-17  Admit Date - 03/26/2018  Outpatient Primary MD for the patient is [name], MD  Referring MD/NP/PA: Marla Roe  Outpatient Specialists:  [name] (cardiology)  Patient coming from: ALF, [place]  Chief Complaint  Patient presents with  . Hip Injury      HPI:    [name] [name]  is a 84 y.o. male, w hypertension, hyperlipidemia,  dm2, CAD s/p stent, CHF (ef 50-55%), Pafib/ flutter, CKD stage 3, osteopenia, compression fracture L1, apparently had fall about 1 week ago Tuesday  (mechanical).  He fell backwards as he was trying to put on his pants.  Today he went to see his pcp and they performed an Xray and sent him to Ed.   In ED,  Na 137, K 4.4, Bun 29, Creatinine 2.05 Glucose 138 Wbc 8.2, Hgb 9.2, Plt 223  CXR IMPRESSION: No acute abnormality.  L hip xray IMPRESSION: Nondisplaced LEFT femur intertrochanteric fracture versus neurovascular channel.  ED consulted orthopedics Dr Alvan Dame  Pt will be admitted for L intertrochanteric fracture.    Review of systems:    In addition to the HPI above,  No Fever-chills, No Headache, No changes with Vision or hearing, No problems swallowing food or Liquids, No Chest pain, Cough or Shortness of Breath, No Abdominal pain, No Nausea or Vommitting, Bowel movements are regular, No Blood in stool or Urine, No dysuria, No new skin rashes or bruises,  No new weakness, tingling, numbness in any extremity, No recent weight gain or loss, No polyuria, polydypsia or polyphagia, No significant Mental Stressors.  A full 10 point Review of Systems was done, except as stated above, all other Review of Systems were negative.   With Past History of the following :    Past Medical History:  Diagnosis Date  . Allergic  reaction caused by a drug 11/2017  . Anemia   . Atrial flutter ([place])    a. s/p RFCA of counterclockwise cavo-tricuspid isthmus dependent atrial flutter 2009 by Dr. Rayann Heman.  Marland Kitchen CAD (coronary artery disease) 06/2014   a. 06/2014: abnl nuc. Cath: Totally occluded LAD with faint collaterals (not a candidate for CTO PCI), 90% ramus s/p DES, 50-70% OM2.  . Chronic diastolic CHF (congestive heart failure) ([place])    a. 2D Echo 10/23/13: EF 50-55%, basal mid-inf HK, grade 2 DD, mild MR, mod dilated LA, no sig change from prior.  . CKD (chronic kidney disease), stage III ([place])   . Colostomy in place [facility])   . Complication of anesthesia    " during my kidney stone surgery my heart went out of rhythm  . Compression fracture of L1 lumbar vertebra ([name])   . DCM (dilated cardiomyopathy) ([place]) 02/27/2018   EF 35-40% by TEE 02/2018 felt tachycardia mediated  .  Diabetes ([place])    TYPE 2   . Dyslipidemia   . Dyspnea   . Frequent PVCs   . GERD (gastroesophageal reflux disease)   . History of blood transfusion   . History of kidney stones   . History of peptic ulcer disease   . HTN (hypertension)   . Hx of acute renal failure 01/02/10-3/24-11   due to hypovolemic shock, gastroenteritis and dehydration,hospitalized . Did requre a few days of dialysis. Cr at discharge was 1.8  . Kidney disease   . Kidney stone may 2009   Right hydronephrosis, S/P stone removal  . Lower back pain   . Macular degeneration   . OA (osteoarthritis) of hip   . Obesity   . Osteopenia   . PAF (paroxysmal atrial fibrillation) ([name])    not on long term anticoagulation due to history of heme positive stools and anemia  . Shingles    episode  . Small bowel obstruction, partial ([place]) 2009   Episode  . Ulcerative colitis ([place])    a. s/p total colectomy remotely.      Past Surgical History:  Procedure Laterality Date  . BACK SURGERY    . CARDIAC CATHETERIZATION  06/19/2014  . CARDIOVERSION N/A 02/23/2018   Procedure:  CARDIOVERSION;  Surgeon: Dorothy Spark, MD;  Location: [facility] ENDOSCOPY;  Service: Cardiovascular;  Laterality: N/A;  . COLON RESECTION    . COLOSTOMY    . CORONARY ANGIOPLASTY    . CORONARY STENT PLACEMENT  06/19/2014   DES       dr Martinique  . HARDWARE REMOVAL Left 12/01/2017   Procedure: HARDWARE REMOVAL LEFT ANKLE;  Surgeon: Newt Minion, MD;  Location: [place];  Service: Orthopedics;  Laterality: Left;  . HERNIA REPAIR    . LEFT AND RIGHT HEART CATHETERIZATION WITH CORONARY ANGIOGRAM N/A 06/19/2014   Procedure: LEFT AND RIGHT HEART CATHETERIZATION WITH CORONARY ANGIOGRAM;  Surgeon: Peter M Martinique, MD;  Location: [facility] CATH LAB;  Service: Cardiovascular;  Laterality: N/A;  . ORIF ANKLE FRACTURE Left 10/25/2017   Procedure: OPEN REDUCTION INTERNAL FIXATION (ORIF) LEFT ANKLE FRACTURE;  Surgeon: Newt Minion, MD;  Location: [place];  Service: Orthopedics;  Laterality: Left;  . TEE WITHOUT CARDIOVERSION N/A 02/23/2018   Procedure: TRANSESOPHAGEAL ECHOCARDIOGRAM (TEE);  Surgeon: Dorothy Spark, MD;  Location: [facility] ENDOSCOPY;  Service: Cardiovascular;  Laterality: N/A;  . TONSILLECTOMY        Social History:     Social History   Tobacco Use  . Smoking status: Never Smoker  . Smokeless tobacco: Never Used  Substance Use Topics  . Alcohol use: No     Lives - at ALF  Mobility - unclear, walking previously   Family History :     Family History  Problem Relation Age of Onset  . Colon cancer Mother       Home Medications:   Prior to Admission medications   Medication Sig Start Date End Date Taking? Authorizing Provider  Amino Acids-Protein Hydrolys (FEEDING SUPPLEMENT, PRO-STAT SUGAR FREE 64,) LIQD Take 30 mLs by mouth daily.   Yes [provider]  amiodarone (PACERONE) 200 MG tablet Take 1 tablet (200 mg total) by mouth daily. 07/03/17  Yes Turner, Eber Hong, MD  apixaban (ELIQUIS) 2.5 MG TABS tablet Take 1 tablet (2.5 mg total) by mouth 2 (two) times daily. Patient taking  differently: Take 2.5 mg by mouth daily.  02/24/18  Yes Georgette Shell, MD  aspirin EC 81 MG EC tablet  Take 1 tablet (81 mg total) by mouth daily. 02/25/18  Yes Georgette Shell, MD  calcium carbonate (OS-CAL) 600 MG TABS tablet Take 600 mg by mouth daily with breakfast.   Yes [provider]  Dermatological Products, Misc. (MOISTURE BARRIER) OINT Apply 1 application topically 3 (three) times daily. To buttocks/sacrum   Yes [provider]  docusate sodium (COLACE) 100 MG capsule Take 1 capsule (100 mg total) by mouth 2 (two) times daily. 10/27/17  Yes Doreatha Lew, MD  Multiple Vitamin (MULTIVITAMIN WITH MINERALS) TABS tablet Take 1 tablet by mouth daily.   Yes [provider]  omeprazole (PRILOSEC) 20 MG capsule Take 20 mg by mouth every evening.   Yes [provider]  pioglitazone (ACTOS) 30 MG tablet Take 1 tablet (30 mg total) by mouth daily. 11/05/14  Yes Dunn, Dayna N, PA-C  saxagliptin HCl (ONGLYZA) 5 MG TABS tablet Take 0.5 tablets (2.5 mg total) by mouth daily. 12/13/17  Yes Velvet Bathe, MD  acetaminophen (TYLENOL) 500 MG tablet Take 1 tablet (500 mg total) by mouth every 6 (six) hours as needed (for pain or headaches). Available over the counter Patient not taking: Reported on 03/26/2018 01/08/17   Modena Jansky, MD  furosemide (LASIX) 40 MG tablet Take 1 tablet (40 mg total) by mouth daily. Patient not taking: Reported on 03/26/2018 02/25/18   Georgette Shell, MD  hydrocortisone cream 1 % Apply 1 application topically 3 (three) times daily as needed for itching (skin irritation). Patient not taking: Reported on 03/26/2018 02/24/18   Georgette Shell, MD  oxyCODONE-acetaminophen (PERCOCET) 10-325 MG tablet Take 1 tablet by mouth every 4 (four) hours as needed for pain. Patient not taking: Reported on 03/26/2018 10/27/17   Patrecia Pour, Christean Grief, MD  tamsulosin [facility]) 0.4 MG CAPS capsule Take 1 capsule (0.4 mg total) by mouth daily  after supper. Patient not taking: Reported on 03/26/2018 10/27/14   Florencia Reasons, MD     Allergies:     Allergies  Allergen Reactions  . Brilinta [Ticagrelor] Shortness Of Breath and Other (See Comments)    Changed to Plavix due to SOB  . Clindamycin/Lincomycin Anaphylaxis    Whole body skin peeling, blisters  . Doxycycline Anaphylaxis  . Penicillins Rash and Other (See Comments)    Has patient had a PCN reaction causing immediate rash, facial/tongue/throat swelling, SOB or lightheadedness with hypotension:Yes Has patient had a PCN reaction causing severe rash involving mucus membranes or skin necrosis: No Has patient had a PCN reaction that required hospitalization: No Has patient had a PCN reaction occurring within the last 10 years: No If all of the above answers are "NO", then may proceed with Cephalosporin use.  . Sulfa Antibiotics Other (See Comments)    Pt does not remember the reaction, but it sure the allergy exists.     Physical Exam:   Vitals  Blood pressure (!) 123/43, pulse 66, temperature 98.5 F (36.9 C), temperature source Oral, height 5' 10"  (1.778 m), weight 105.2 kg (232 lb), SpO2 95 %.   1. General  lying in bed in NAD,   2. Normal affect and insight, Not Suicidal or Homicidal, Awake Alert, Oriented X 3.  3. No F.N deficits, ALL C.Nerves Intact, Strength 5/5 all 4 extremities, Sensation intact all 4 extremities, Plantars down going.  4. Ears and Eyes appear Normal, Conjunctivae clear, PERRLA. Moist Oral Mucosa.  5. Supple Neck, No JVD, No cervical lymphadenopathy appriciated, No Carotid Bruits.  6. Symmetrical Chest  wall movement, Good air movement bilaterally, CTAB.  7. Irr, irr, s1, s2   8. Positive Bowel Sounds, Abdomen Soft, No tenderness, No organomegaly appriciated,No rebound -guarding or rigidity.  9.  No Cyanosis, Normal Skin Turgor, No Skin Rash or Bruise.  10. Good muscle tone,  joints appear normal , no effusions, Normal ROM.  11. No  Palpable Lymph Nodes in Neck or Axillae     Data Review:    CBC Recent Labs  Lab 03/26/18 2101  WBC 8.2  HGB 9.2*  HCT 30.7*  PLT 223  MCV 96.8  MCH 29.0  MCHC 30.0  RDW 16.1*  LYMPHSABS 1.3  MONOABS 0.8  EOSABS 0.3  BASOSABS 0.0   ------------------------------------------------------------------------------------------------------------------  Chemistries  Recent Labs  Lab 03/26/18 2101  NA 137  K 4.4  CL 106  CO2 22  GLUCOSE 138*  BUN 49*  CREATININE 2.05*  CALCIUM 8.3*   ------------------------------------------------------------------------------------------------------------------ estimated creatinine clearance is 33.2 mL/min (A) (by C-G formula based on SCr of 2.05 mg/dL (H)). ------------------------------------------------------------------------------------------------------------------ No results for input(s): TSH, T4TOTAL, T3FREE, THYROIDAB in the last 72 hours.  Invalid input(s): FREET3  Coagulation profile No results for input(s): INR, PROTIME in the last 168 hours. ------------------------------------------------------------------------------------------------------------------- No results for input(s): DDIMER in the last 72 hours. -------------------------------------------------------------------------------------------------------------------  Cardiac Enzymes No results for input(s): CKMB, TROPONINI, MYOGLOBIN in the last 168 hours.  Invalid input(s): CK ------------------------------------------------------------------------------------------------------------------    Component Value Date/Time   BNP 488.1 (H) 02/19/2018 0347     ---------------------------------------------------------------------------------------------------------------  Urinalysis    Component Value Date/Time   COLORURINE YELLOW 12/08/2017 1600   APPEARANCEUR HAZY (A) 12/08/2017 1600   LABSPEC 1.018 12/08/2017 1600   PHURINE 8.0 12/08/2017 1600   GLUCOSEU  NEGATIVE 12/08/2017 1600   HGBUR SMALL (A) 12/08/2017 1600   BILIRUBINUR NEGATIVE 12/08/2017 1600   KETONESUR NEGATIVE 12/08/2017 1600   PROTEINUR NEGATIVE 12/08/2017 1600   UROBILINOGEN 0.2 10/22/2014 1052   NITRITE NEGATIVE 12/08/2017 1600   LEUKOCYTESUR MODERATE (A) 12/08/2017 1600    ----------------------------------------------------------------------------------------------------------------   Imaging Results:    Dg Chest 2 View  Result Date: 03/26/2018 CLINICAL DATA:  Preoperative chest x-ray EXAM: CHEST - 2 VIEW COMPARISON:  Feb 19, 2018 FINDINGS: Stable cardiomegaly. Mild atelectasis in the left base. The hila, mediastinum, lungs, and pleura are otherwise normal. A compression fracture of a lower thoracic vertebral body is stable. IMPRESSION: No acute abnormality. Electronically Signed   By: Dorise Bullion III M.D   On: 03/26/2018 21:34   Dg Hip Unilat With Pelvis 2-3 Views Left  Result Date: 03/26/2018 CLINICAL DATA:  [place] 1 week ago, LEFT hip pain. EXAM: DG HIP (WITH OR WITHOUT PELVIS) 2-3V LEFT COMPARISON:  CT abdomen and pelvis April 8 1,019 FINDINGS: Linear lucency intertrochanteric femur. Femoral head is intact and located. Osteopenia without destructive bony lesion. Moderate vascular calcification. Surgical clips project in the pelvis. IMPRESSION: Nondisplaced LEFT femur intertrochanteric fracture versus neurovascular channel. These results will be called to the ordering clinician or representative by the Radiologist Assistant, and communication documented in the PACS or zVision Dashboard. Electronically Signed   By: Elon Alas M.D.   On: 03/26/2018 14:40     EKG Afib at 80, LAD, no st-t changes c/w ischemia   Assessment & Plan:    Active Problems:   Intertrochanteric fracture ([name])    L intertrochanteric fracture STOP Eliquis Check INR, PTT in am Check urinalysis Orthopedics consult appreciated  CAD s/p stent Hold Asprin Check lipid Not clear why not  on  Statin  Pafib Cont Amiodarone STOP Eliquis  CHF (EF ef 50%) Cont Lasix 40m po qday  Dm2 fsbs ac and qhs, ISS Cont Actos Cont Onglyza=> Tradjenta  BPH Cont Flomax  Anemia Check cbc in am  CKD stage 3 Check cbc, cmp in am    DVT Prophylaxis - SCDs   AM Labs Ordered, also please review Full Orders  Family Communication: Admission, patients condition and plan of care including tests being ordered have been discussed with the patient  who indicate understanding and agree with the plan and Code Status.  Code Status  DNR  Likely DC to  home  Condition GUARDED    Consults called: orthopedics by ED,   Admission status: inpatient  Time spent in minutes :45   JJani GravelM.D on 03/26/2018 at 10:33 PM  Between 7am to 7pm - Pager - [phone]  After 7pm go to [email or web address] - password [email or web address] - Office  [phone]

## 2018-03-26 NOTE — ED Notes (Signed)
Pt sister called very upset that pt is in hallway, I assured her he is getting the same treatment he would receive in a room. She states the pt sees Dr Sharol Given for his leg.

## 2018-03-26 NOTE — ED Triage Notes (Signed)
Pt BIB [facility] from [place], pt fell June 4, c/o left hip pain with ambulation, xray done at facility, + left hip fx, c/o BLE edema, hx CHF lasix increased today.

## 2018-03-27 ENCOUNTER — Other Ambulatory Visit (INDEPENDENT_AMBULATORY_CARE_PROVIDER_SITE_OTHER): Payer: Self-pay | Admitting: Orthopedic Surgery

## 2018-03-27 ENCOUNTER — Encounter (HOSPITAL_COMMUNITY): Payer: Self-pay | Admitting: Cardiology

## 2018-03-27 ENCOUNTER — Inpatient Hospital Stay (HOSPITAL_COMMUNITY): Payer: Medicare Other

## 2018-03-27 DIAGNOSIS — I480 Paroxysmal atrial fibrillation: Secondary | ICD-10-CM

## 2018-03-27 DIAGNOSIS — S72142A Displaced intertrochanteric fracture of left femur, initial encounter for closed fracture: Secondary | ICD-10-CM

## 2018-03-27 DIAGNOSIS — E43 Unspecified severe protein-calorie malnutrition: Secondary | ICD-10-CM

## 2018-03-27 DIAGNOSIS — E1142 Type 2 diabetes mellitus with diabetic polyneuropathy: Secondary | ICD-10-CM

## 2018-03-27 DIAGNOSIS — Z01810 Encounter for preprocedural cardiovascular examination: Secondary | ICD-10-CM

## 2018-03-27 DIAGNOSIS — S72001A Fracture of unspecified part of neck of right femur, initial encounter for closed fracture: Secondary | ICD-10-CM

## 2018-03-27 LAB — URINALYSIS, ROUTINE W REFLEX MICROSCOPIC
Bilirubin Urine: NEGATIVE
Glucose, UA: NEGATIVE mg/dL
Ketones, ur: NEGATIVE mg/dL
Nitrite: NEGATIVE
Protein, ur: NEGATIVE mg/dL
Specific Gravity, Urine: 1.014 (ref 1.005–1.030)
WBC, UA: >50 WBC/hpf — ABNORMAL HIGH (ref 0–5)
pH: 6 (ref 5.0–8.0)

## 2018-03-27 LAB — LIPID PANEL
Cholesterol: 112 mg/dL (ref 0–200)
HDL: 36 mg/dL — ABNORMAL LOW (ref >40)
LDL Cholesterol: 52 mg/dL (ref 0–99)
Total CHOL/HDL Ratio: 3.1 RATIO
Triglycerides: 119 mg/dL (ref <150)
VLDL: 24 mg/dL (ref 0–40)

## 2018-03-27 LAB — GLUCOSE, CAPILLARY
Glucose-Capillary: 117 mg/dL — ABNORMAL HIGH (ref 65–99)
Glucose-Capillary: 130 mg/dL — ABNORMAL HIGH (ref 65–99)
Glucose-Capillary: 134 mg/dL — ABNORMAL HIGH (ref 65–99)
Glucose-Capillary: 135 mg/dL — ABNORMAL HIGH (ref 65–99)
Glucose-Capillary: 156 mg/dL — ABNORMAL HIGH (ref 65–99)
Glucose-Capillary: 166 mg/dL — ABNORMAL HIGH (ref 65–99)

## 2018-03-27 LAB — APTT: aPTT: 40 seconds — ABNORMAL HIGH (ref 24–36)

## 2018-03-27 LAB — COMPREHENSIVE METABOLIC PANEL
ALT: 20 U/L (ref 17–63)
AST: 26 U/L (ref 15–41)
Albumin: 2 g/dL — ABNORMAL LOW (ref 3.5–5.0)
Alkaline Phosphatase: 114 U/L (ref 38–126)
Anion gap: 8 (ref 5–15)
BUN: 48 mg/dL — ABNORMAL HIGH (ref 6–20)
CO2: 25 mmol/L (ref 22–32)
Calcium: 8.2 mg/dL — ABNORMAL LOW (ref 8.9–10.3)
Chloride: 107 mmol/L (ref 101–111)
Creatinine, Ser: 1.86 mg/dL — ABNORMAL HIGH (ref 0.61–1.24)
GFR calc Af Amer: 37 mL/min — ABNORMAL LOW (ref >60)
GFR calc non Af Amer: 32 mL/min — ABNORMAL LOW (ref >60)
Glucose, Bld: 135 mg/dL — ABNORMAL HIGH (ref 65–99)
Potassium: 4.1 mmol/L (ref 3.5–5.1)
Sodium: 140 mmol/L (ref 135–145)
Total Bilirubin: 0.5 mg/dL (ref 0.3–1.2)
Total Protein: 5.4 g/dL — ABNORMAL LOW (ref 6.5–8.1)

## 2018-03-27 LAB — HEMOGLOBIN A1C
Hgb A1c MFr Bld: 6.7 % — ABNORMAL HIGH (ref 4.8–5.6)
Mean Plasma Glucose: 145.59 mg/dL

## 2018-03-27 LAB — PROTIME-INR
INR: 1.21
Prothrombin Time: 15.2 seconds (ref 11.4–15.2)

## 2018-03-27 LAB — CBC
HCT: 28.5 % — ABNORMAL LOW (ref 39.0–52.0)
Hemoglobin: 8.8 g/dL — ABNORMAL LOW (ref 13.0–17.0)
MCH: 29.5 pg (ref 26.0–34.0)
MCHC: 30.9 g/dL (ref 30.0–36.0)
MCV: 95.6 fL (ref 78.0–100.0)
Platelets: 212 10*3/uL (ref 150–400)
RBC: 2.98 MIL/uL — ABNORMAL LOW (ref 4.22–5.81)
RDW: 16.1 % — ABNORMAL HIGH (ref 11.5–15.5)
WBC: 6.8 10*3/uL (ref 4.0–10.5)

## 2018-03-27 MED ORDER — APIXABAN 2.5 MG PO TABS
2.5000 mg | ORAL_TABLET | Freq: Two times a day (BID) | ORAL | Status: DC
  Administered 2018-03-27 – 2018-03-29 (×4): 2.5 mg via ORAL
  Filled 2018-03-27 (×4): qty 1

## 2018-03-27 MED ORDER — HEPARIN (PORCINE) IN NACL 100-0.45 UNIT/ML-% IJ SOLN
1400.0000 [IU]/h | INTRAMUSCULAR | Status: DC
  Administered 2018-03-27: 1400 [IU]/h via INTRAVENOUS
  Filled 2018-03-27: qty 250

## 2018-03-27 MED ORDER — ASPIRIN EC 81 MG PO TBEC
81.0000 mg | DELAYED_RELEASE_TABLET | Freq: Every day | ORAL | Status: DC
  Administered 2018-03-27 – 2018-03-29 (×3): 81 mg via ORAL
  Filled 2018-03-27 (×3): qty 1

## 2018-03-27 NOTE — Progress Notes (Signed)
ANTICOAGULATION CONSULT NOTE - Initial Consult  Pharmacy Consult for apixaban Indication: atrial fibrillation  Allergies  Allergen Reactions  . Brilinta [Ticagrelor] Shortness Of Breath and Other (See Comments)    Changed to Plavix due to SOB  . Clindamycin/Lincomycin Anaphylaxis    Whole body skin peeling, blisters  . Doxycycline Anaphylaxis  . Penicillins Rash and Other (See Comments)    PATIENT HAS HAD A PCN REACTION WITH IMMEDIATE RASH, FACIAL/TONGUE/THROAT SWELLING, SOB, OR LIGHTHEADEDNESS WITH HYPOTENSION:  #  #  YES  #  #  Has patient had a PCN reaction causing severe rash involving mucus membranes or skin necrosis: No Has patient had a PCN reaction that required hospitalization: No Has patient had a PCN reaction occurring within the last 10 years: No  . Sulfa Antibiotics Other (See Comments)    Pt does not remember the reaction, but it sure the allergy exists. UNSPECIFIED REACTION     Patient Measurements: Height: 5' 10"  (177.8 cm) Weight: 233 lb 4 oz (105.8 kg) IBW/kg (Calculated) : 73 Heparin Dosing Weight: 95 kg  Vital Signs: Temp: 98 F (36.7 C) (06/11 1402) Temp Source: Oral (06/11 1402) BP: 117/45 (06/11 1402) Pulse Rate: 70 (06/11 1402)  Labs: Recent Labs    03/26/18 2101 03/27/18 0547  HGB 9.2* 8.8*  HCT 30.7* 28.5*  PLT 223 212  APTT  --  40*  LABPROT  --  15.2  INR  --  1.21  CREATININE 2.05* 1.86*    Estimated Creatinine Clearance: 36.6 mL/min (A) (by C-G formula based on SCr of 1.86 mg/dL (H)).   Medical History: Past Medical History:  Diagnosis Date  . Allergic reaction caused by a drug 11/2017  . Anemia   . Atrial flutter ([place])    a. s/p RFCA of counterclockwise cavo-tricuspid isthmus dependent atrial flutter 2009 by Dr. Rayann Heman.  Marland Kitchen CAD (coronary artery disease) 06/2014   a. 06/2014: abnl nuc. Cath: Totally occluded LAD with faint collaterals (not a candidate for CTO PCI), 90% ramus s/p DES, 50-70% OM2.  . Chronic diastolic CHF  (congestive heart failure) ([place])    a. 2D Echo 10/23/13: EF 50-55%, basal mid-inf HK, grade 2 DD, mild MR, mod dilated LA, no sig change from prior.  . CKD (chronic kidney disease), stage III ([place])   . Colostomy in place [facility])   . Complication of anesthesia    " during my kidney stone surgery my heart went out of rhythm  . Compression fracture of L1 lumbar vertebra ([name])   . DCM (dilated cardiomyopathy) ([place]) 02/27/2018   EF 35-40% by TEE 02/2018 felt tachycardia mediated  . Diabetes ([place])    TYPE 2   . Dyslipidemia   . Dyspnea   . Frequent PVCs   . GERD (gastroesophageal reflux disease)   . History of blood transfusion   . History of kidney stones   . History of peptic ulcer disease   . HTN (hypertension)   . Hx of acute renal failure 01/02/10-3/24-11   due to hypovolemic shock, gastroenteritis and dehydration,hospitalized . Did requre a few days of dialysis. Cr at discharge was 1.8  . Kidney disease   . Kidney stone may 2009   Right hydronephrosis, S/P stone removal  . Lower back pain   . Macular degeneration   . OA (osteoarthritis) of hip   . Obesity   . Osteopenia   . PAF (paroxysmal atrial fibrillation) ([name])    not on long term anticoagulation due to history of heme positive stools  and anemia  . Shingles    episode  . Small bowel obstruction, partial ([place]) 2009   Episode  . Ulcerative colitis ([place])    a. s/p total colectomy remotely.    Assessment: 84 yo on Eliquis PTA for Afib. Last dose was on the evening of 6/9. Pt was transitioned to IV heparin in anticipation of ortho surgery.  Pt re-evaluated and no surgery needed.  MD has ordered transition back to apixaban.  Hgb 8.8, plts wnl.  Goal of Therapy:  Therapeutic anticoagulation Monitor platelets by anticoagulation protocol: Yes   Plan:  Discontinue heparin and associated labs. Begin Apixaban 2.37m PO BID.  Dose reduced for age >>82and SCR >1.5.  [employer] Pharm.D., BCPS Clinical Pharmacist Pager:  [phone] phone for 03/27/2018 is [phone] 03/27/2018 5:55 PM

## 2018-03-27 NOTE — Consult Note (Signed)
Reason for Consult: Left hip fracture Referring Physician: Tyrell Antonio, MD (Hospitalist)  [name] [name] is an 84 y.o. male.  HPI: [name] [name]  is a 84 y.o. male, w hypertension, hyperlipidemia,  dm2, CAD s/p stent, CHF (ef 50-55%), Pafib/ flutter, CKD stage 3, osteopenia, compression fracture L1, apparently had fall about 1 week ago Tuesday  (mechanical).  He fell backwards as he was trying to put on his pants.  Today he went to see his pcp and they performed an Xray and sent him to [facility].  Seen and evaluated in hospital room.  Complains predominantly of left hip pain with ambulation Left ankle issues and mobility concerns since fracture No RLE or UE complaints, acute    Past Medical History:  Diagnosis Date  . Allergic reaction caused by a drug 11/2017  . Anemia   . Atrial flutter ([place])    a. s/p RFCA of counterclockwise cavo-tricuspid isthmus dependent atrial flutter 2009 by Dr. Rayann Heman.  Marland Kitchen CAD (coronary artery disease) 06/2014   a. 06/2014: abnl nuc. Cath: Totally occluded LAD with faint collaterals (not a candidate for CTO PCI), 90% ramus s/p DES, 50-70% OM2.  . Chronic diastolic CHF (congestive heart failure) ([place])    a. 2D Echo 10/23/13: EF 50-55%, basal mid-inf HK, grade 2 DD, mild MR, mod dilated LA, no sig change from prior.  . CKD (chronic kidney disease), stage III ([place])   . Colostomy in place [facility])   . Complication of anesthesia    " during my kidney stone surgery my heart went out of rhythm  . Compression fracture of L1 lumbar vertebra ([name])   . DCM (dilated cardiomyopathy) ([place]) 02/27/2018   EF 35-40% by TEE 02/2018 felt tachycardia mediated  . Diabetes ([place])    TYPE 2   . Dyslipidemia   . Dyspnea   . Frequent PVCs   . GERD (gastroesophageal reflux disease)   . History of blood transfusion   . History of kidney stones   . History of peptic ulcer disease   . HTN (hypertension)   . Hx of acute renal failure 01/02/10-3/24-11   due to hypovolemic shock, gastroenteritis and  dehydration,hospitalized . Did requre a few days of dialysis. Cr at discharge was 1.8  . Kidney disease   . Kidney stone may 2009   Right hydronephrosis, S/P stone removal  . Lower back pain   . Macular degeneration   . OA (osteoarthritis) of hip   . Obesity   . Osteopenia   . PAF (paroxysmal atrial fibrillation) ([name])    not on long term anticoagulation due to history of heme positive stools and anemia  . Shingles    episode  . Small bowel obstruction, partial ([place]) 2009   Episode  . Ulcerative colitis ([place])    a. s/p total colectomy remotely.    Past Surgical History:  Procedure Laterality Date  . BACK SURGERY    . CARDIAC CATHETERIZATION  06/19/2014  . CARDIOVERSION N/A 02/23/2018   Procedure: CARDIOVERSION;  Surgeon: Dorothy Spark, MD;  Location: [facility] ENDOSCOPY;  Service: Cardiovascular;  Laterality: N/A;  . COLON RESECTION    . COLOSTOMY    . CORONARY ANGIOPLASTY    . CORONARY STENT PLACEMENT  06/19/2014   DES       dr Martinique  . HARDWARE REMOVAL Left 12/01/2017   Procedure: HARDWARE REMOVAL LEFT ANKLE;  Surgeon: Newt Minion, MD;  Location: [place];  Service: Orthopedics;  Laterality: Left;  . HERNIA REPAIR    .  LEFT AND RIGHT HEART CATHETERIZATION WITH CORONARY ANGIOGRAM N/A 06/19/2014   Procedure: LEFT AND RIGHT HEART CATHETERIZATION WITH CORONARY ANGIOGRAM;  Surgeon: Peter M Martinique, MD;  Location: [facility] CATH LAB;  Service: Cardiovascular;  Laterality: N/A;  . ORIF ANKLE FRACTURE Left 10/25/2017   Procedure: OPEN REDUCTION INTERNAL FIXATION (ORIF) LEFT ANKLE FRACTURE;  Surgeon: Newt Minion, MD;  Location: [place];  Service: Orthopedics;  Laterality: Left;  . TEE WITHOUT CARDIOVERSION N/A 02/23/2018   Procedure: TRANSESOPHAGEAL ECHOCARDIOGRAM (TEE);  Surgeon: Dorothy Spark, MD;  Location: [facility] ENDOSCOPY;  Service: Cardiovascular;  Laterality: N/A;  . TONSILLECTOMY      Family History  Problem Relation Age of Onset  . Colon cancer Mother     Social History:  reports  that he has never smoked. He has never used smokeless tobacco. He reports that he does not drink alcohol or use drugs.  Allergies:  Allergies  Allergen Reactions  . Brilinta [Ticagrelor] Shortness Of Breath and Other (See Comments)    Changed to Plavix due to SOB  . Clindamycin/Lincomycin Anaphylaxis    Whole body skin peeling, blisters  . Doxycycline Anaphylaxis  . Penicillins Rash and Other (See Comments)    Has patient had a PCN reaction causing immediate rash, facial/tongue/throat swelling, SOB or lightheadedness with hypotension:Yes Has patient had a PCN reaction causing severe rash involving mucus membranes or skin necrosis: No Has patient had a PCN reaction that required hospitalization: No Has patient had a PCN reaction occurring within the last 10 years: No If all of the above answers are "NO", then may proceed with Cephalosporin use.  . Sulfa Antibiotics Other (See Comments)    Pt does not remember the reaction, but it sure the allergy exists.    Medications:  I have reviewed the patient's current medications. Scheduled: . amiodarone  200 mg Oral Daily  . docusate sodium  100 mg Oral BID  . feeding supplement (PRO-STAT SUGAR FREE 64)  30 mL Oral Daily  . insulin aspart  0-5 Units Subcutaneous QHS  . insulin aspart  0-9 Units Subcutaneous TID WC  . linagliptin  5 mg Oral Daily  . multivitamin with minerals  1 tablet Oral Daily  . pantoprazole  40 mg Oral Daily  . pioglitazone  30 mg Oral Q breakfast  . sodium chloride flush  3 mL Intravenous Q12H    Results for orders placed or performed during the hospital encounter of 03/26/18 (from the past 24 hour(s))  Basic metabolic panel     Status: Abnormal   Collection Time: 03/26/18  9:01 PM  Result Value Ref Range   Sodium 137 135 - 145 mmol/L   Potassium 4.4 3.5 - 5.1 mmol/L   Chloride 106 101 - 111 mmol/L   CO2 22 22 - 32 mmol/L   Glucose, Bld 138 (H) 65 - 99 mg/dL   BUN 49 (H) 6 - 20 mg/dL   Creatinine, Ser 2.05 (H)  0.61 - 1.24 mg/dL   Calcium 8.3 (L) 8.9 - 10.3 mg/dL   GFR calc non Af Amer 28 (L) >60 mL/min   GFR calc Af Amer 33 (L) >60 mL/min   Anion gap 9 5 - 15  CBC with Differential     Status: Abnormal   Collection Time: 03/26/18  9:01 PM  Result Value Ref Range   WBC 8.2 4.0 - 10.5 K/uL   RBC 3.17 (L) 4.22 - 5.81 MIL/uL   Hemoglobin 9.2 (L) 13.0 - 17.0 g/dL   HCT 30.7 (  L) 39.0 - 52.0 %   MCV 96.8 78.0 - 100.0 fL   MCH 29.0 26.0 - 34.0 pg   MCHC 30.0 30.0 - 36.0 g/dL   RDW 16.1 (H) 11.5 - 15.5 %   Platelets 223 150 - 400 K/uL   Neutrophils Relative % 70 %   Neutro Abs 5.8 1.7 - 7.7 K/uL   Lymphocytes Relative 15 %   Lymphs Abs 1.3 0.7 - 4.0 K/uL   Monocytes Relative 10 %   Monocytes Absolute 0.8 0.1 - 1.0 K/uL   Eosinophils Relative 4 %   Eosinophils Absolute 0.3 0.0 - 0.7 K/uL   Basophils Relative 0 %   Basophils Absolute 0.0 0.0 - 0.1 K/uL   Immature Granulocytes 1 %   Abs Immature Granulocytes 0.0 0.0 - 0.1 K/uL  Glucose, capillary     Status: Abnormal   Collection Time: 03/27/18 12:46 AM  Result Value Ref Range   Glucose-Capillary 156 (H) 65 - 99 mg/dL  Protime-INR     Status: None   Collection Time: 03/27/18  5:47 AM  Result Value Ref Range   Prothrombin Time 15.2 11.4 - 15.2 seconds   INR 1.21   APTT     Status: Abnormal   Collection Time: 03/27/18  5:47 AM  Result Value Ref Range   aPTT 40 (H) 24 - 36 seconds  CBC     Status: Abnormal   Collection Time: 03/27/18  5:47 AM  Result Value Ref Range   WBC 6.8 4.0 - 10.5 K/uL   RBC 2.98 (L) 4.22 - 5.81 MIL/uL   Hemoglobin 8.8 (L) 13.0 - 17.0 g/dL   HCT 28.5 (L) 39.0 - 52.0 %   MCV 95.6 78.0 - 100.0 fL   MCH 29.5 26.0 - 34.0 pg   MCHC 30.9 30.0 - 36.0 g/dL   RDW 16.1 (H) 11.5 - 15.5 %   Platelets 212 150 - 400 K/uL  Comprehensive metabolic panel     Status: Abnormal   Collection Time: 03/27/18  5:47 AM  Result Value Ref Range   Sodium 140 135 - 145 mmol/L   Potassium 4.1 3.5 - 5.1 mmol/L   Chloride 107 101 - 111  mmol/L   CO2 25 22 - 32 mmol/L   Glucose, Bld 135 (H) 65 - 99 mg/dL   BUN 48 (H) 6 - 20 mg/dL   Creatinine, Ser 1.86 (H) 0.61 - 1.24 mg/dL   Calcium 8.2 (L) 8.9 - 10.3 mg/dL   Total Protein 5.4 (L) 6.5 - 8.1 g/dL   Albumin 2.0 (L) 3.5 - 5.0 g/dL   AST 26 15 - 41 U/L   ALT 20 17 - 63 U/L   Alkaline Phosphatase 114 38 - 126 U/L   Total Bilirubin 0.5 0.3 - 1.2 mg/dL   GFR calc non Af Amer 32 (L) >60 mL/min   GFR calc Af Amer 37 (L) >60 mL/min   Anion gap 8 5 - 15  Hemoglobin A1c     Status: Abnormal   Collection Time: 03/27/18  5:47 AM  Result Value Ref Range   Hgb A1c MFr Bld 6.7 (H) 4.8 - 5.6 %   Mean Plasma Glucose 145.59 mg/dL  Lipid panel     Status: Abnormal   Collection Time: 03/27/18  5:47 AM  Result Value Ref Range   Cholesterol 112 0 - 200 mg/dL   Triglycerides 119 <150 mg/dL   HDL 36 (L) >40 mg/dL   Total CHOL/HDL Ratio 3.1 RATIO   VLDL  24 0 - 40 mg/dL   LDL Cholesterol 52 0 - 99 mg/dL  Glucose, capillary     Status: Abnormal   Collection Time: 03/27/18  6:19 AM  Result Value Ref Range   Glucose-Capillary 117 (H) 65 - 99 mg/dL     X-ray: CLINICAL DATA:  Fell 1 week ago, LEFT hip pain.  EXAM: DG HIP (WITH OR WITHOUT PELVIS) 2-3V LEFT  COMPARISON:  CT abdomen and pelvis April 8 1,019  FINDINGS: Linear lucency intertrochanteric femur. Femoral head is intact and located. Osteopenia without destructive bony lesion. Moderate vascular calcification. Surgical clips project in the pelvis.  IMPRESSION: Nondisplaced LEFT femur intertrochanteric fracture versus neurovascular channel.  These results will be called to the ordering clinician or representative by the Radiologist Assistant, and communication documented in the PACS or zVision Dashboard.   Electronically Signed   By: Elon Alas M.D.  ROS  As noted in HPI and admission note Left ankle fracture January Lived alone until that time then has been in SNF  Blood pressure (!) 113/58, pulse  67, temperature 98 F (36.7 C), temperature source Oral, resp. rate 20, height 5' 10"  (1.778 m), weight 105.8 kg (233 lb 4 oz), SpO2 98 %.  Physical Exam  Awake alert LLE ER but not significantly shortened RLE normal BUE normal Blood pressure (!) 123/43, pulse 66, temperature 98.5 F (36.9 C), temperature source Oral, height 5' 10"  (1.778 m), weight 105.2 kg (232 lb), SpO2 95 %.   1. General  lying in bed in NAD,   2. Normal affect and insight, Not Suicidal or Homicidal, Awake Alert, Oriented X 3.  3. No F.N deficits, ALL C.Nerves Intact, Strength 5/5 all 4 extremities, Sensation intact all 4 extremities, Plantars down going.  4. Ears and Eyes appear Normal, Conjunctivae clear, PERRLA. Moist Oral Mucosa.  5. Supple Neck, No JVD, No cervical lymphadenopathy appriciated, No Carotid Bruits.  6. Symmetrical Chest wall movement, Good air movement bilaterally, CTAB.  7. Irr, irr, s1, s2   8. Positive Bowel Sounds, Abdomen Soft, No tenderness, No organomegaly appriciated,No rebound -guarding or rigidity.  9.  No Cyanosis, Normal Skin Turgor, No Skin Rash or Bruise.  10. Good muscle tone,  joints appear normal , no effusions, Normal ROM.  11. No Palpable Lymph Nodes in Neck or Axillae    Assessment/Plan: Non displaced left inter-trochanteric femur fracture  I feel it is in his best interest to have fracture fixed. He has had care through [facility] in the past (left ankle - Sharol Given).  I will have Silvestre Gunner contact members of that group to have addressed likely tomorrow or Thursday due to Eliquis use  Mauri Pole 03/27/2018, 7:49 AM

## 2018-03-27 NOTE — Progress Notes (Addendum)
Patient arrived to 5 N rm 6 via stretcher. A&O x4. Call light within reach.  Will continue to monitor.

## 2018-03-27 NOTE — Consult Note (Signed)
ORTHOPAEDIC CONSULTATION  REQUESTING PHYSICIAN: Elmarie Shiley, MD  Chief Complaint: Left hip pain.  HPI: [name] [name] is a 84 y.o. male who presents with left hip pain.  Patient states that he fell a week ago on Tuesday was having pain with weightbearing he went to his doctor's office on Friday and radiographs were obtained which shows a nondisplaced intertrochanteric hip fracture on the left.  Patient has type 2 diabetes with severe protein caloric malnutrition.  Past Medical History:  Diagnosis Date  . Allergic reaction caused by a drug 11/2017  . Anemia   . Atrial flutter ([place])    a. s/p RFCA of counterclockwise cavo-tricuspid isthmus dependent atrial flutter 2009 by Dr. Rayann Heman.  Marland Kitchen CAD (coronary artery disease) 06/2014   a. 06/2014: abnl nuc. Cath: Totally occluded LAD with faint collaterals (not a candidate for CTO PCI), 90% ramus s/p DES, 50-70% OM2.  . Chronic diastolic CHF (congestive heart failure) ([place])    a. 2D Echo 10/23/13: EF 50-55%, basal mid-inf HK, grade 2 DD, mild MR, mod dilated LA, no sig change from prior.  . CKD (chronic kidney disease), stage III ([place])   . Colostomy in place [facility])   . Complication of anesthesia    " during my kidney stone surgery my heart went out of rhythm  . Compression fracture of L1 lumbar vertebra ([name])   . DCM (dilated cardiomyopathy) ([place]) 02/27/2018   EF 35-40% by TEE 02/2018 felt tachycardia mediated  . Diabetes ([place])    TYPE 2   . Dyslipidemia   . Dyspnea   . Frequent PVCs   . GERD (gastroesophageal reflux disease)   . History of blood transfusion   . History of kidney stones   . History of peptic ulcer disease   . HTN (hypertension)   . Hx of acute renal failure 01/02/10-3/24-11   due to hypovolemic shock, gastroenteritis and dehydration,hospitalized . Did requre a few days of dialysis. Cr at discharge was 1.8  . Kidney disease   . Kidney stone may 2009   Right hydronephrosis, S/P stone removal  . Lower back pain   .  Macular degeneration   . OA (osteoarthritis) of hip   . Obesity   . Osteopenia   . PAF (paroxysmal atrial fibrillation) ([name])    not on long term anticoagulation due to history of heme positive stools and anemia  . Shingles    episode  . Small bowel obstruction, partial ([place]) 2009   Episode  . Ulcerative colitis ([place])    a. s/p total colectomy remotely.   Past Surgical History:  Procedure Laterality Date  . BACK SURGERY    . CARDIAC CATHETERIZATION  06/19/2014  . CARDIOVERSION N/A 02/23/2018   Procedure: CARDIOVERSION;  Surgeon: Dorothy Spark, MD;  Location: [facility] ENDOSCOPY;  Service: Cardiovascular;  Laterality: N/A;  . COLON RESECTION    . COLOSTOMY    . CORONARY ANGIOPLASTY    . CORONARY STENT PLACEMENT  06/19/2014   DES       dr Martinique  . HARDWARE REMOVAL Left 12/01/2017   Procedure: HARDWARE REMOVAL LEFT ANKLE;  Surgeon: Newt Minion, MD;  Location: [place];  Service: Orthopedics;  Laterality: Left;  . HERNIA REPAIR    . LEFT AND RIGHT HEART CATHETERIZATION WITH CORONARY ANGIOGRAM N/A 06/19/2014   Procedure: LEFT AND RIGHT HEART CATHETERIZATION WITH CORONARY ANGIOGRAM;  Surgeon: Peter M Martinique, MD;  Location: [facility] CATH LAB;  Service: Cardiovascular;  Laterality: N/A;  . ORIF  ANKLE FRACTURE Left 10/25/2017   Procedure: OPEN REDUCTION INTERNAL FIXATION (ORIF) LEFT ANKLE FRACTURE;  Surgeon: Newt Minion, MD;  Location: [place];  Service: Orthopedics;  Laterality: Left;  . TEE WITHOUT CARDIOVERSION N/A 02/23/2018   Procedure: TRANSESOPHAGEAL ECHOCARDIOGRAM (TEE);  Surgeon: Dorothy Spark, MD;  Location: [facility] ENDOSCOPY;  Service: Cardiovascular;  Laterality: N/A;  . TONSILLECTOMY     Social History   Socioeconomic History  . Marital status: Widowed    Spouse name: Not on file  . Number of children: Not on file  . Years of education: Not on file  . Highest education level: Not on file  Occupational History  . Not on file  Social Needs  . Financial resource strain: Not on file    . Food insecurity:    Worry: Not on file    Inability: Not on file  . Transportation needs:    Medical: Not on file    Non-medical: Not on file  Tobacco Use  . Smoking status: Never Smoker  . Smokeless tobacco: Never Used  Substance and Sexual Activity  . Alcohol use: No  . Drug use: No  . Sexual activity: Not on file  Lifestyle  . Physical activity:    Days per week: Not on file    Minutes per session: Not on file  . Stress: Not on file  Relationships  . Social connections:    Talks on phone: Not on file    Gets together: Not on file    Attends religious service: Not on file    Active member of club or organization: Not on file    Attends meetings of clubs or organizations: Not on file    Relationship status: Not on file  Other Topics Concern  . Not on file  Social History Narrative  . Not on file   Family History  Problem Relation Age of Onset  . Colon cancer Mother    - negative except otherwise stated in the family history section Allergies  Allergen Reactions  . Brilinta [Ticagrelor] Shortness Of Breath and Other (See Comments)    Changed to Plavix due to SOB  . Clindamycin/Lincomycin Anaphylaxis    Whole body skin peeling, blisters  . Doxycycline Anaphylaxis  . Penicillins Rash and Other (See Comments)    Has patient had a PCN reaction causing immediate rash, facial/tongue/throat swelling, SOB or lightheadedness with hypotension:Yes Has patient had a PCN reaction causing severe rash involving mucus membranes or skin necrosis: No Has patient had a PCN reaction that required hospitalization: No Has patient had a PCN reaction occurring within the last 10 years: No If all of the above answers are "NO", then may proceed with Cephalosporin use.  . Sulfa Antibiotics Other (See Comments)    Pt does not remember the reaction, but it sure the allergy exists.   Prior to Admission medications   Medication Sig Start Date End Date Taking? Authorizing Provider  [place]  Acids-Protein Hydrolys (FEEDING SUPPLEMENT, PRO-STAT SUGAR FREE 64,) LIQD Take 30 mLs by mouth daily.   Yes [provider]  amiodarone (PACERONE) 200 MG tablet Take 1 tablet (200 mg total) by mouth daily. 07/03/17  Yes Turner, Eber Hong, MD  apixaban (ELIQUIS) 2.5 MG TABS tablet Take 1 tablet (2.5 mg total) by mouth 2 (two) times daily. Patient taking differently: Take 2.5 mg by mouth daily.  02/24/18  Yes Georgette Shell, MD  aspirin EC 81 MG EC tablet Take 1 tablet (81 mg total) by mouth  daily. 02/25/18  Yes Georgette Shell, MD  calcium carbonate (OS-CAL) 600 MG TABS tablet Take 600 mg by mouth daily with breakfast.   Yes [provider]  Dermatological Products, Misc. (MOISTURE BARRIER) OINT Apply 1 application topically 3 (three) times daily. To buttocks/sacrum   Yes [provider]  docusate sodium (COLACE) 100 MG capsule Take 1 capsule (100 mg total) by mouth 2 (two) times daily. 10/27/17  Yes Doreatha Lew, MD  Multiple Vitamin (MULTIVITAMIN WITH MINERALS) TABS tablet Take 1 tablet by mouth daily.   Yes [provider]  omeprazole (PRILOSEC) 20 MG capsule Take 20 mg by mouth every evening.   Yes [provider]  pioglitazone (ACTOS) 30 MG tablet Take 1 tablet (30 mg total) by mouth daily. 11/05/14  Yes Dunn, Dayna N, PA-C  saxagliptin HCl (ONGLYZA) 5 MG TABS tablet Take 0.5 tablets (2.5 mg total) by mouth daily. 12/13/17  Yes Velvet Bathe, MD  acetaminophen (TYLENOL) 500 MG tablet Take 1 tablet (500 mg total) by mouth every 6 (six) hours as needed (for pain or headaches). Available over the counter Patient not taking: Reported on 03/26/2018 01/08/17   Modena Jansky, MD  furosemide (LASIX) 40 MG tablet Take 1 tablet (40 mg total) by mouth daily. Patient not taking: Reported on 03/26/2018 02/25/18   Georgette Shell, MD  hydrocortisone cream 1 % Apply 1 application topically 3 (three) times daily as needed for itching (skin  irritation). Patient not taking: Reported on 03/26/2018 02/24/18   Georgette Shell, MD  oxyCODONE-acetaminophen (PERCOCET) 10-325 MG tablet Take 1 tablet by mouth every 4 (four) hours as needed for pain. Patient not taking: Reported on 03/26/2018 10/27/17   Patrecia Pour, Christean Grief, MD  tamsulosin [facility]) 0.4 MG CAPS capsule Take 1 capsule (0.4 mg total) by mouth daily after supper. Patient not taking: Reported on 03/26/2018 10/27/14   Florencia Reasons, MD   Dg Chest 2 View  Result Date: 03/26/2018 CLINICAL DATA:  Preoperative chest x-ray EXAM: CHEST - 2 VIEW COMPARISON:  Feb 19, 2018 FINDINGS: Stable cardiomegaly. Mild atelectasis in the left base. The hila, mediastinum, lungs, and pleura are otherwise normal. A compression fracture of a lower thoracic vertebral body is stable. IMPRESSION: No acute abnormality. Electronically Signed   By: Dorise Bullion III M.D   On: 03/26/2018 21:34   Dg Hip Unilat With Pelvis 2-3 Views Left  Result Date: 03/26/2018 CLINICAL DATA:  [place] 1 week ago, LEFT hip pain. EXAM: DG HIP (WITH OR WITHOUT PELVIS) 2-3V LEFT COMPARISON:  CT abdomen and pelvis April 8 1,019 FINDINGS: Linear lucency intertrochanteric femur. Femoral head is intact and located. Osteopenia without destructive bony lesion. Moderate vascular calcification. Surgical clips project in the pelvis. IMPRESSION: Nondisplaced LEFT femur intertrochanteric fracture versus neurovascular channel. These results will be called to the ordering clinician or representative by the Radiologist Assistant, and communication documented in the PACS or zVision Dashboard. Electronically Signed   By: Elon Alas M.D.   On: 03/26/2018 14:40   - pertinent xrays, CT, MRI studies were reviewed and independently interpreted  Positive ROS: All other systems have been reviewed and were otherwise negative with the exception of those mentioned in the HPI and as above.  Physical Exam: General: Alert, no acute distress Psychiatric: Patient  is competent for consent with normal mood and affect Lymphatic: No axillary or cervical lymphadenopathy Cardiovascular: No pedal edema Respiratory: No cyanosis, no use of accessory musculature GI: No organomegaly, abdomen is soft and non-tender  Images:  @ENCIMAGES @  Labs:  Lab Results  Component Value Date   HGBA1C 6.7 (H) 03/27/2018   HGBA1C 7.5 (H) 02/20/2018   HGBA1C 6.4 (H) 12/05/2017   REPTSTATUS 12/09/2017 FINAL 12/08/2017   CULT MULTIPLE SPECIES PRESENT, SUGGEST RECOLLECTION (A) 12/08/2017    Lab Results  Component Value Date   ALBUMIN 2.0 (L) 03/27/2018   ALBUMIN 2.1 (L) 02/19/2018   ALBUMIN 2.4 (L) 10/25/2017    Neurologic: Patient does not have protective sensation bilateral lower extremities.   MUSCULOSKELETAL:   Skin: Examination patient does have some swelling there is no full-thickness skin defect.  Patient has a palpable pulse of the left lower extremity was externally rotated.  Patient does not have pain with internal or external rotation of the hip.  Radiographs were reviewed which shows a nondisplaced intertrochanteric hip fracture.  Assessment: Assessment: Nondisplaced intertrochanteric left hip fracture 1 week from the initial injury.    Plan: Plan: We will obtain a CT scan to further evaluate the anatomy of the hip fracture.  Is not completely identified by the x-ray.  We will plan for internal fixation on Wednesday or Friday.  Thank you for the consult and the opportunity to see Mr. Filipe Greathouse, [place] [phone] 9:47 AM

## 2018-03-27 NOTE — Consult Note (Addendum)
Cardiology Consultation:   Patient ID: [name] [name]; [id number]; [birth year]   Admit date: 03/26/2018 Date of Consult: 03/27/2018  Primary Care Provider: [name], MD Primary Cardiologist: [name], MD  Primary Electrophysiologist: NA   Patient Profile:   [name] [name] is a 84 y.o. male with a hx of PAF, 2 vessel ASCAD with chronic total occlusion of the LAD with minimal collaterals and severe disease of the large ramus and normal LVF s/p DES to the ramus, chronic diastolic HF, marked brady with severe hyperkalemia the first of May, which resolved once K+ reduced.  He did develop a fib with RVR with holding amiodarone and BB,  Resumed eliquis and had TEE DCCV who is being seen today for the evaluation of pre-op clearance for fx hip at the request of Dr. Tyrell Antonio.  History of Present Illness:   [name] [name] has  a hx of PAF, 2 vessel ASCAD with chronic total occlusion of the LAD with minimal collaterals and severe disease of the large ramus and normal LVF s/p DES to the ramus, chronic diastolic HF, marked brady with severe hyperkalemia the first of May, which resolved once K+ reduced.  He did develop a fib with RVR with holding amiodarone and BB,  Resumed eliquis and had TEE DCCV 02/23/18.  EF at that time EF 35-40% diffuse hypokinesis, trivial AR and mild MR, no thrombus.   Follow up echo was done 03/14/18  With EF 40-45%, with G1DD, LA was severely dilated.  PA pk pressure was 44 mmHg.  Global longitudinal strain -12.5% (abnormal)  Cardiac cath 2015 with 2 vessel obstructive CAD, total occlusion of LAD wit minimal collaterals, stenting of severe stenosis of large ramus intermediate with DES.    Now admitted from [facility] after fall on 03/20/18 and present 03/26/18 to ER by EMS, after xray revealed Lt hip fx.  Also lower ext edema.   Pt was walking with his walker in hall and pants fell down and he went to pull up, letting go of the walker and lost his balance and fell.    Dr. Alvan Dame with  ortho has seen and [facility] Dr. Sharol Given is obtaining CR of hip, and plan for internal fixation Wed. Or Friday.    EKG SR with freq PACs.  LBBB and LAD I personally reviewed  Tele SR with PACs    K+ 4.4, Na 137, Cr yesterday 2.05 now 1.86  LDL 52 Hgb 8.8 WBC 6.8, plts 212.  CXR no HF. No acute process  He had appt on 03/29/18 for HF clinic per his PCP, I have cancelled this for now.    Currently he has no SOB or chest pain.  He had no chest pain with walking with walker.    No lightheadedness or dizziness.  He wants to see Dr. Haroldine Laws prior to discharge.   See below for other hx.    Past Medical History:  Diagnosis Date  . Allergic reaction caused by a drug 11/2017  . Anemia   . Atrial flutter ([place])    a. s/p RFCA of counterclockwise cavo-tricuspid isthmus dependent atrial flutter 2009 by Dr. Rayann Heman.  Marland Kitchen CAD (coronary artery disease) 06/2014   a. 06/2014: abnl nuc. Cath: Totally occluded LAD with faint collaterals (not a candidate for CTO PCI), 90% ramus s/p DES, 50-70% OM2.  . Chronic diastolic CHF (congestive heart failure) ([place])    a. 2D Echo 10/23/13: EF 50-55%, basal mid-inf HK, grade 2 DD, mild MR, mod dilated LA, no sig  change from prior.  . CKD (chronic kidney disease), stage III ([place])   . Colostomy in place [facility])   . Complication of anesthesia    " during my kidney stone surgery my heart went out of rhythm  . Compression fracture of L1 lumbar vertebra ([name])   . DCM (dilated cardiomyopathy) ([place]) 02/27/2018   EF 35-40% by TEE 02/2018 felt tachycardia mediated  . Diabetes ([place])    TYPE 2   . Dyslipidemia   . Dyspnea   . Frequent PVCs   . GERD (gastroesophageal reflux disease)   . History of blood transfusion   . History of kidney stones   . History of peptic ulcer disease   . HTN (hypertension)   . Hx of acute renal failure 01/02/10-3/24-11   due to hypovolemic shock, gastroenteritis and dehydration,hospitalized . Did requre a few days of dialysis. Cr at discharge was  1.8  . Kidney disease   . Kidney stone may 2009   Right hydronephrosis, S/P stone removal  . Lower back pain   . Macular degeneration   . OA (osteoarthritis) of hip   . Obesity   . Osteopenia   . PAF (paroxysmal atrial fibrillation) ([name])    not on long term anticoagulation due to history of heme positive stools and anemia  . Shingles    episode  . Small bowel obstruction, partial ([place]) 2009   Episode  . Ulcerative colitis ([place])    a. s/p total colectomy remotely.    Past Surgical History:  Procedure Laterality Date  . BACK SURGERY    . CARDIAC CATHETERIZATION  06/19/2014  . CARDIOVERSION N/A 02/23/2018   Procedure: CARDIOVERSION;  Surgeon: Dorothy Spark, MD;  Location: [facility] ENDOSCOPY;  Service: Cardiovascular;  Laterality: N/A;  . COLON RESECTION    . COLOSTOMY    . CORONARY ANGIOPLASTY    . CORONARY STENT PLACEMENT  06/19/2014   DES       dr Martinique  . HARDWARE REMOVAL Left 12/01/2017   Procedure: HARDWARE REMOVAL LEFT ANKLE;  Surgeon: Newt Minion, MD;  Location: [place];  Service: Orthopedics;  Laterality: Left;  . HERNIA REPAIR    . LEFT AND RIGHT HEART CATHETERIZATION WITH CORONARY ANGIOGRAM N/A 06/19/2014   Procedure: LEFT AND RIGHT HEART CATHETERIZATION WITH CORONARY ANGIOGRAM;  Surgeon: Peter M Martinique, MD;  Location: [facility] CATH LAB;  Service: Cardiovascular;  Laterality: N/A;  . ORIF ANKLE FRACTURE Left 10/25/2017   Procedure: OPEN REDUCTION INTERNAL FIXATION (ORIF) LEFT ANKLE FRACTURE;  Surgeon: Newt Minion, MD;  Location: [place];  Service: Orthopedics;  Laterality: Left;  . TEE WITHOUT CARDIOVERSION N/A 02/23/2018   Procedure: TRANSESOPHAGEAL ECHOCARDIOGRAM (TEE);  Surgeon: Dorothy Spark, MD;  Location: [facility] ENDOSCOPY;  Service: Cardiovascular;  Laterality: N/A;  . TONSILLECTOMY       Home Medications:  Prior to Admission medications   Medication Sig Start Date End Date Taking? Authorizing Provider  Amino Acids-Protein Hydrolys (FEEDING SUPPLEMENT, PRO-STAT SUGAR  FREE 64,) LIQD Take 30 mLs by mouth daily.   Yes [provider]  amiodarone (PACERONE) 200 MG tablet Take 1 tablet (200 mg total) by mouth daily. 07/03/17  Yes Turner, Eber Hong, MD  apixaban (ELIQUIS) 2.5 MG TABS tablet Take 1 tablet (2.5 mg total) by mouth 2 (two) times daily. Patient taking differently: Take 2.5 mg by mouth daily.  02/24/18  Yes Georgette Shell, MD  aspirin EC 81 MG EC tablet Take 1 tablet (81 mg total) by mouth daily. 02/25/18  Yes Georgette Shell, MD  calcium carbonate (OS-CAL) 600 MG TABS tablet Take 600 mg by mouth daily with breakfast.   Yes [provider]  Dermatological Products, Misc. (MOISTURE BARRIER) OINT Apply 1 application topically 3 (three) times daily. To buttocks/sacrum   Yes [provider]  docusate sodium (COLACE) 100 MG capsule Take 1 capsule (100 mg total) by mouth 2 (two) times daily. 10/27/17  Yes Doreatha Lew, MD  Multiple Vitamin (MULTIVITAMIN WITH MINERALS) TABS tablet Take 1 tablet by mouth daily.   Yes [provider]  omeprazole (PRILOSEC) 20 MG capsule Take 20 mg by mouth every evening.   Yes [provider]  pioglitazone (ACTOS) 30 MG tablet Take 1 tablet (30 mg total) by mouth daily. 11/05/14  Yes Dunn, Dayna N, PA-C  saxagliptin HCl (ONGLYZA) 5 MG TABS tablet Take 0.5 tablets (2.5 mg total) by mouth daily. 12/13/17  Yes Velvet Bathe, MD  acetaminophen (TYLENOL) 500 MG tablet Take 1 tablet (500 mg total) by mouth every 6 (six) hours as needed (for pain or headaches). Available over the counter Patient not taking: Reported on 03/26/2018 01/08/17   Modena Jansky, MD  furosemide (LASIX) 40 MG tablet Take 1 tablet (40 mg total) by mouth daily. Patient not taking: Reported on 03/26/2018 02/25/18   Georgette Shell, MD  hydrocortisone cream 1 % Apply 1 application topically 3 (three) times daily as needed for itching (skin irritation). Patient not taking: Reported on 03/26/2018 02/24/18    Georgette Shell, MD  oxyCODONE-acetaminophen (PERCOCET) 10-325 MG tablet Take 1 tablet by mouth every 4 (four) hours as needed for pain. Patient not taking: Reported on 03/26/2018 10/27/17   Patrecia Pour, Christean Grief, MD  tamsulosin [facility]) 0.4 MG CAPS capsule Take 1 capsule (0.4 mg total) by mouth daily after supper. Patient not taking: Reported on 03/26/2018 10/27/14   Florencia Reasons, MD    Inpatient Medications: Scheduled Meds: . amiodarone  200 mg Oral Daily  . docusate sodium  100 mg Oral BID  . feeding supplement (PRO-STAT SUGAR FREE 64)  30 mL Oral Daily  . insulin aspart  0-5 Units Subcutaneous QHS  . insulin aspart  0-9 Units Subcutaneous TID WC  . linagliptin  5 mg Oral Daily  . multivitamin with minerals  1 tablet Oral Daily  . pantoprazole  40 mg Oral Daily  . sodium chloride flush  3 mL Intravenous Q12H   Continuous Infusions: . sodium chloride    . heparin     PRN Meds: sodium chloride, acetaminophen **OR** acetaminophen, HYDROmorphone (DILAUDID) injection, sodium chloride flush  Allergies:    Allergies  Allergen Reactions  . Brilinta [Ticagrelor] Shortness Of Breath and Other (See Comments)    Changed to Plavix due to SOB  . Clindamycin/Lincomycin Anaphylaxis    Whole body skin peeling, blisters  . Doxycycline Anaphylaxis  . Penicillins Rash and Other (See Comments)    Has patient had a PCN reaction causing immediate rash, facial/tongue/throat swelling, SOB or lightheadedness with hypotension:Yes Has patient had a PCN reaction causing severe rash involving mucus membranes or skin necrosis: No Has patient had a PCN reaction that required hospitalization: No Has patient had a PCN reaction occurring within the last 10 years: No If all of the above answers are "NO", then may proceed with Cephalosporin use.  . Sulfa Antibiotics Other (See Comments)    Pt does not remember the reaction, but it sure the allergy exists.    Social History:   Social History  Socioeconomic  History  . Marital status: Widowed    Spouse name: Not on file  . Number of children: Not on file  . Years of education: Not on file  . Highest education level: Not on file  Occupational History  . Not on file  Social Needs  . Financial resource strain: Not on file  . Food insecurity:    Worry: Not on file    Inability: Not on file  . Transportation needs:    Medical: Not on file    Non-medical: Not on file  Tobacco Use  . Smoking status: Never Smoker  . Smokeless tobacco: Never Used  Substance and Sexual Activity  . Alcohol use: No  . Drug use: No  . Sexual activity: Not on file  Lifestyle  . Physical activity:    Days per week: Not on file    Minutes per session: Not on file  . Stress: Not on file  Relationships  . Social connections:    Talks on phone: Not on file    Gets together: Not on file    Attends religious service: Not on file    Active member of club or organization: Not on file    Attends meetings of clubs or organizations: Not on file    Relationship status: Not on file  . Intimate partner violence:    Fear of current or ex partner: Not on file    Emotionally abused: Not on file    Physically abused: Not on file    Forced sexual activity: Not on file  Other Topics Concern  . Not on file  Social History Narrative  . Not on file    Family History:    Family History  Problem Relation Age of Onset  . Colon cancer Mother      ROS:  Please see the history of present illness.  General:no colds or fevers, no weight changes Skin:no rashes or ulcers HEENT:no blurred vision, no congestion CV:see HPI PUL:see HPI GI:no diarrhea constipation or melena, no indigestion, colostomy GU:no hematuria, no dysuria MS:+ hip pain, no claudication, fx Lt ankle earlier in year Neuro:no syncope, no lightheadedness Endo:+ diabetes, no thyroid disease  All other ROS reviewed and negative.     Physical Exam/Data:   Vitals:   03/26/18 2301 03/27/18 0000 03/27/18  0500 03/27/18 0610  BP: (!) 129/59 (!) 132/59  (!) 113/58  Pulse: 76 79  67  Resp: 16 19  20   Temp:  97.9 F (36.6 C)  98 F (36.7 C)  TempSrc:  Oral  Oral  SpO2: 98% 98%  98%  Weight:  233 lb 4 oz (105.8 kg) 233 lb 4 oz (105.8 kg)   Height:        Intake/Output Summary (Last 24 hours) at 03/27/2018 1102 Last data filed at 03/27/2018 0900 Gross per 24 hour  Intake 462 ml  Output 500 ml  Net -38 ml   Filed Weights   03/26/18 1828 03/27/18 0000 03/27/18 0500  Weight: 232 lb (105.2 kg) 233 lb 4 oz (105.8 kg) 233 lb 4 oz (105.8 kg)   Body mass index is 33.47 kg/m.  General:  Well nourished, well developed, in no acute distress HEENT: normal Lymph: no adenopathy Neck: no JVD Endocrine:  No thryomegaly Vascular: No carotid bruits; pedal pulses 1+ bilaterally  Cardiac: irreg irreg no murmur, gallup or rub  Lungs:  breath sounds present to auscultation bilaterally, no wheezing, rhonchi or rales though diminished Abd: obese, soft, nontender, no  hepatomegaly + colostomy  Ext: Tr  edema Musculoskeletal:  No deformities, BUE and BLE strength normal and equal Skin: warm and dry  Neuro:  Alert and oriented X 3 MAE, follows commands, no focal abnormalities noted Psych:  Normal affect     Relevant CV Studies: TEE 02/23/18 Study Conclusions  - Left ventricle: Systolic function was moderately reduced. The   estimated ejection fraction was in the range of 35% to 40%.   Diffuse hypokinesis. No evidence of thrombus. - Aortic valve: No evidence of vegetation. There was trivial   regurgitation. - Aorta: The aorta was normal, not dilated, with severe non-mobile   atheroma in the descending aorta. - Mitral valve: No evidence of vegetation. There was mild   regurgitation. - Left atrium: No evidence of thrombus in the atrial cavity or   appendage. No evidence of thrombus in the atrial cavity or   appendage. - Right atrium: No evidence of thrombus in the atrial cavity or   appendage. -  Tricuspid valve: No evidence of vegetation. - Pulmonic valve: No evidence of vegetation.  Impressions:  - Successful cardioversion. No cardiac source of emboli was   indentified.  TTE 03/14/18  Study Conclusions  - Left ventricle: The cavity size was normal. There was mild   concentric hypertrophy. Systolic function was mildly to   moderately reduced. The estimated ejection fraction was in the   range of 40% to 45%. Diffuse hypokinesis. Doppler parameters are   consistent with abnormal left ventricular relaxation (grade 1   diastolic dysfunction). Doppler parameters are consistent with   indeterminate ventricular filling pressure. - Aortic valve: Transvalvular velocity was within the normal range.   There was no stenosis. There was trivial regurgitation.   Regurgitation pressure half-time: 793 ms. - Mitral valve: Transvalvular velocity was within the normal range.   There was no evidence for stenosis. - Left atrium: The atrium was severely dilated. - Right ventricle: The cavity size was normal. Wall thickness was   normal. Systolic function was normal. - Right atrium: The atrium was moderately dilated. - Tricuspid valve: There was mild regurgitation. - Pulmonary arteries: Systolic pressure was mildly increased. PA   peak pressure: 44 mm Hg (S). - Global longitudinal strain -12.5% (abnormal).    Laboratory Data:  Chemistry Recent Labs  Lab 03/26/18 2101 03/27/18 0547  NA 137 140  K 4.4 4.1  CL 106 107  CO2 22 25  GLUCOSE 138* 135*  BUN 49* 48*  CREATININE 2.05* 1.86*  CALCIUM 8.3* 8.2*  GFRNONAA 28* 32*  GFRAA 33* 37*  ANIONGAP 9 8    Recent Labs  Lab 03/27/18 0547  PROT 5.4*  ALBUMIN 2.0*  AST 26  ALT 20  ALKPHOS 114  BILITOT 0.5   Hematology Recent Labs  Lab 03/26/18 2101 03/27/18 0547  WBC 8.2 6.8  RBC 3.17* 2.98*  HGB 9.2* 8.8*  HCT 30.7* 28.5*  MCV 96.8 95.6  MCH 29.0 29.5  MCHC 30.0 30.9  RDW 16.1* 16.1*  PLT 223 212   Cardiac  EnzymesNo results for input(s): TROPONINI in the last 168 hours. No results for input(s): TROPIPOC in the last 168 hours.  BNPNo results for input(s): BNP, PROBNP in the last 168 hours.  DDimer No results for input(s): DDIMER in the last 168 hours.  Radiology/Studies:  Dg Chest 2 View  Result Date: 03/26/2018 CLINICAL DATA:  Preoperative chest x-ray EXAM: CHEST - 2 VIEW COMPARISON:  Feb 19, 2018 FINDINGS: Stable cardiomegaly. Mild atelectasis in the left  base. The hila, mediastinum, lungs, and pleura are otherwise normal. A compression fracture of a lower thoracic vertebral body is stable. IMPRESSION: No acute abnormality. Electronically Signed   By: Dorise Bullion III M.D   On: 03/26/2018 21:34   Dg Hip Unilat With Pelvis 2-3 Views Left  Result Date: 03/26/2018 CLINICAL DATA:  [place] 1 week ago, LEFT hip pain. EXAM: DG HIP (WITH OR WITHOUT PELVIS) 2-3V LEFT COMPARISON:  CT abdomen and pelvis April 8 1,019 FINDINGS: Linear lucency intertrochanteric femur. Femoral head is intact and located. Osteopenia without destructive bony lesion. Moderate vascular calcification. Surgical clips project in the pelvis. IMPRESSION: Nondisplaced LEFT femur intertrochanteric fracture versus neurovascular channel. These results will be called to the ordering clinician or representative by the Radiologist Assistant, and communication documented in the PACS or zVision Dashboard. Electronically Signed   By: Elon Alas M.D.   On: 03/26/2018 14:40    Assessment and Plan:   Pre-op clearance - Pt is an 84 yo wlth known CAD as noted above    He is not very  Active, was walking  with a walker when fell.  He Can walk up to 120 feet without chest pain or SOB  He is not having any active anginal symptoms    On exam he is in SR with very mild volume increase   LVEF on echo 40 to 45%     From a cardiac standpoint he is at mod risk of cardiac complications, mostly volume overload.  His coronary anatomy from last cath has  chronic occlusion of LAD with minimal collaterals  Ramus was large and stented.    Overall, pt is an acceptable risk for surgery for hip.  Recomm close attention to BP and fluids through procedure    Will continue to follow with you   2.         PAF with recent episode after holding amiodarone and BB due to Bradycardia with elevated K+,  With TEE DCCV pt has been in SR.  It has been 4 weeks since DCCV.    His CHADS2VASC score Is 6 now back on amiodarone.   - Pt is currently on heparin and amiodarone 200 mg daily.    3.        ASCAD with PCI of RI in 2015 and chronically occluded LAD, 50-70% OM2, not a candidate for CTO of LAD.  Is on ASA 81 mg daily  Follow BP   Avoid hypotension    4.        HTN 132/59 to 113/58 on no meds   5.       Chronic systolic CHF Recent echo, LVEF is  up to 40-45% from 35-40%.   6.         HLD, on zocor 20 and LDL was at 44   WOuld  Continue statin   7.         Anemia  Hgb at 8, was at 9.7 previously   8.          CKD  Stage 3   Cr today 1.86. (usual between 1.2 to 1.6)  Follow    For questions or updates, please contact [place] Please consult [email or web address] for contact info under Cardiology/STEMI.   Signed, Cecilie Kicks, NP  03/27/2018 11:02 AM   Pt seen and examined   I have amended note above by L In[place]  Pt is an 84 yo with history of significant CAD, mildly LV dysfunction, PAF, HTN Admitted with  Fx to hip    On exam;  Neck is full, jvp mildly increased  Lungs are CTA  Cardiac exam:  RRR with skips  No murmurs  ABd is benign  Ext with tr edema  Tele and EKG with SR with PACs.  From a cardiac standpoint he is at some increased risk for periop cardiac complication (mainly volume overload/CHF)   Would recomm close control of BP, avoid hypotension given CAD      Will continue to follow   Dorris Carnes

## 2018-03-27 NOTE — Evaluation (Signed)
Physical Therapy Evaluation Patient Details Name: [name] [name] MRN: [id number] DOB: 1933-06-04 Today's Date: 03/27/2018   History of Present Illness  pt is and 84 y/o male with HTN, DM2, CAD with stenting, CHF, p AFIB, compression fx L1, admitted from [facility] Living center after a fall approx 1 week prior to admission.  Initially xray showed L trochanteric fx, but CT on admission showed no hip fx, but did show sup/in pubic rami fx's on the left.  Clinical Impression  Pt admitted with/for fx's stated above post recent fall.  Pt needing min to mod assist for mobility at this time.  Pt currently limited functionally due to the problems listed. ( See problems list.)   Pt will benefit from PT to maximize function and safety in order to get ready for next venue listed below.     Follow Up Recommendations SNF;Follow surgeon's recommendation for DC plan and follow-up therapies(or A living)    Equipment Recommendations  Other (comment)(TBA)    Recommendations for Other Services       Precautions / Restrictions Precautions Precautions: Fall Restrictions LUE Weight Bearing: Weight bearing as tolerated RLE Weight Bearing: Weight bearing as tolerated LLE Weight Bearing: Weight bearing as tolerated      Mobility  Bed Mobility Overal bed mobility: Needs Assistance Bed Mobility: Supine to Sit;Sit to Supine     Supine to sit: Min assist Sit to supine: Mod assist   General bed mobility comments: cues for direction and technique, assist to bridge to EOB and come up and forward.  Transfers Overall transfer level: Needs assistance Equipment used: Rolling walker (2 wheeled) Transfers: Sit to/from Stand Sit to Stand: Min assist         General transfer comment: cues for hand placement, assist to come forward and up.  Ambulation/Gait Ambulation/Gait assistance: Min assist           General Gait Details: marched in place and sidestepped to [employer] feet with min assist  Stairs             Wheelchair Mobility    Modified Rankin (Stroke Patients Only)       Balance Overall balance assessment: Mild deficits observed, not formally tested                                           Pertinent Vitals/Pain Pain Assessment: Faces Faces Pain Scale: Hurts little more Pain Location: L hip/groin Pain Descriptors / Indicators: Grimacing Pain Intervention(s): Monitored during session    Home Living Family/patient expects to be discharged to:: Skilled nursing facility                 Additional Comments: pt will like go either to SNF or A living:  has scooter or ?power chair.    Prior Function Level of Independence: Needs assistance   Gait / Transfers Assistance Needed: working with therapy at SNF - walking 100 feet with RW and min assist at SNF  ADL's / Homemaking Assistance Needed: required assist from SNF staff for ADL        Hand Dominance   Dominant Hand: Right    Extremity/Trunk Assessment   Upper Extremity Assessment Upper Extremity Assessment: Overall WFL for tasks assessed    Lower Extremity Assessment Lower Extremity Assessment: Generalized weakness(weakness hip flexors, hams bil:  bil edematous and sore legs)    Cervical / Trunk Assessment Cervical /  Trunk Assessment: Kyphotic  Communication   Communication: No difficulties  Cognition                                       General Comments: poor historian, otherwise did not test formally      General Comments      Exercises     Assessment/Plan    PT Assessment Patient needs continued PT services  PT Problem List Decreased strength;Decreased activity tolerance;Decreased mobility;Decreased balance;Decreased knowledge of use of DME;Pain       PT Treatment Interventions DME instruction;Gait training;Functional mobility training;Therapeutic activities;Balance training;Patient/family education    PT Goals (Current goals can be found in the Care  Plan section)  Acute Rehab PT Goals Patient Stated Goal: Finish up with therapy and be able to walk PT Goal Formulation: With patient Time For Goal Achievement: 04/10/18 Potential to Achieve Goals: Fair    Frequency Min 3X/week   Barriers to discharge Decreased caregiver support      Co-evaluation               AM-PAC PT "6 Clicks" Daily Activity  Outcome Measure Difficulty turning over in bed (including adjusting bedclothes, sheets and blankets)?: Unable Difficulty moving from lying on back to sitting on the side of the bed? : Unable Difficulty sitting down on and standing up from a chair with arms (e.g., wheelchair, bedside commode, etc,.)?: Unable Help needed moving to and from a bed to chair (including a wheelchair)?: A Lot Help needed walking in hospital room?: A Lot Help needed climbing 3-5 steps with a railing? : A Lot 6 Click Score: 9    End of Session   Activity Tolerance: Patient tolerated treatment well;Patient limited by pain Patient left: in bed;with call bell/phone within reach;with bed alarm set Nurse Communication: Mobility status PT Visit Diagnosis: Other abnormalities of gait and mobility (R26.89);Muscle weakness (generalized) (M62.81);Pain Pain - part of body: (left hip/groin)    Time: 7048-8891 PT Time Calculation (min) (ACUTE ONLY): 24 min   Charges:   PT Evaluation $PT Eval Moderate Complexity: 1 Mod PT Treatments $Therapeutic Activity: 8-22 mins   PT G Codes:        04-21-2018  Donnella Sham, PT [phone]  (pager)  [name] [name] 21-Apr-2018, 7:27 PM

## 2018-03-27 NOTE — Progress Notes (Signed)
PROGRESS NOTE    [name] [name]  ZDG:[id number] DOB: Mar 08, 1933 DOA: 03/26/2018 PCP: [name], MD    Brief Narrative: [name] [name]  is a 84 y.o. male, w hypertension, hyperlipidemia,  dm2, CAD s/p stent, CHF (ef 50-55%), Pafib/ flutter, CKD stage 3, osteopenia, compression fracture L1, apparently had fall about 1 week ago Tuesday  (mechanical).  He fell backwards as he was trying to put on his pants.  Today he went to see his pcp and they performed an Xray and sent him to [facility].  L hip xray IMPRESSION: Nondisplaced LEFT femur intertrochanteric fracture versus neurovascular channel.  Assessment & Plan:   Active Problems:   Intertrochanteric fracture ([name])   1- Nondisplaced LEFT femur intertrochanteric fracture versus neurovascular channel. Evaluated by Dr Sharol Given, who recommended CT to better evaluate. CT was negative for hip fracture. Showed: Fractures of the left inferior and superior pubic rami. Will change back from heparin to eliquis.   2-CAD; S/P stent;  Resume aspirin.   Paroxysmal A fib;  Continue with amiodarone.  Started on IV heparin.  No surgery needed  will place back on eliquis.   DM;  SSI./ Continue with tradjenta.   BPH; flomax.   Anemia;  Follow trend.   CKD stage III; last cr per records 1.7---2.1 Cr 2---1.8  Monitor.      DVT prophylaxis: heparin  Code Status: DNR Family Communication: care discussed with patient.  Disposition Plan: remain In patient   Consultants:      Procedures:   none   Antimicrobials:  none   Subjective: He denies dyspnea. Complaints of LE edema.   Objective: Vitals:   03/26/18 2301 03/27/18 0000 03/27/18 0500 03/27/18 0610  BP: (!) 129/59 (!) 132/59  (!) 113/58  Pulse: 76 79  67  Resp: 16 19  20   Temp:  97.9 F (36.6 C)  98 F (36.7 C)  TempSrc:  Oral  Oral  SpO2: 98% 98%  98%  Weight:  105.8 kg (233 lb 4 oz) 105.8 kg (233 lb 4 oz)   Height:        Intake/Output Summary (Last 24 hours) at  03/27/2018 0758 Last data filed at 03/27/2018 0015 Gross per 24 hour  Intake 222 ml  Output -  Net 222 ml   Filed Weights   03/26/18 1828 03/27/18 0000 03/27/18 0500  Weight: 105.2 kg (232 lb) 105.8 kg (233 lb 4 oz) 105.8 kg (233 lb 4 oz)    Examination:  General exam: Appears calm and comfortable  Respiratory system: Clear to auscultation. Respiratory effort normal. Cardiovascular system: S1 & S2 heard, RRR. No JVD, murmurs, rubs, gallops or clicks. No pedal edema. Gastrointestinal system: Abdomen is nondistended, soft and nontender. No organomegaly or masses felt. Normal bowel sounds heard. Central nervous system: Alert and oriented. No focal neurological deficits. Extremities: Plus 2 edema.  Skin: No rashes, lesions or ulcers Psychiatry: Judgement and insight appear normal. Mood & affect appropriate.     Data Reviewed: I have personally reviewed following labs and imaging studies  CBC: Recent Labs  Lab 03/26/18 2101 03/27/18 0547  WBC 8.2 6.8  NEUTROABS 5.8  --   HGB 9.2* 8.8*  HCT 30.7* 28.5*  MCV 96.8 95.6  PLT 223 595   Basic Metabolic Panel: Recent Labs  Lab 03/26/18 2101 03/27/18 0547  NA 137 140  K 4.4 4.1  CL 106 107  CO2 22 25  GLUCOSE 138* 135*  BUN 49* 48*  CREATININE 2.05* 1.86*  CALCIUM  8.3* 8.2*   GFR: Estimated Creatinine Clearance: 36.6 mL/min (A) (by C-G formula based on SCr of 1.86 mg/dL (H)). Liver Function Tests: Recent Labs  Lab 03/27/18 0547  AST 26  ALT 20  ALKPHOS 114  BILITOT 0.5  PROT 5.4*  ALBUMIN 2.0*   No results for input(s): LIPASE, AMYLASE in the last 168 hours. No results for input(s): AMMONIA in the last 168 hours. Coagulation Profile: Recent Labs  Lab 03/27/18 0547  INR 1.21   Cardiac Enzymes: No results for input(s): CKTOTAL, CKMB, CKMBINDEX, TROPONINI in the last 168 hours. BNP (last 3 results) No results for input(s): PROBNP in the last 8760 hours. HbA1C: Recent Labs    03/27/18 0547  HGBA1C 6.7*     CBG: Recent Labs  Lab 03/27/18 0046 03/27/18 0619  GLUCAP 156* 117*   Lipid Profile: Recent Labs    03/27/18 0547  CHOL 112  HDL 36*  LDLCALC 52  TRIG 119  CHOLHDL 3.1   Thyroid Function Tests: No results for input(s): TSH, T4TOTAL, FREET4, T3FREE, THYROIDAB in the last 72 hours. Anemia Panel: No results for input(s): VITAMINB12, FOLATE, FERRITIN, TIBC, IRON, RETICCTPCT in the last 72 hours. Sepsis Labs: No results for input(s): PROCALCITON, LATICACIDVEN in the last 168 hours.  No results found for this or any previous visit (from the past 240 hour(s)).       Radiology Studies: Dg Chest 2 View  Result Date: 03/26/2018 CLINICAL DATA:  Preoperative chest x-ray EXAM: CHEST - 2 VIEW COMPARISON:  Feb 19, 2018 FINDINGS: Stable cardiomegaly. Mild atelectasis in the left base. The hila, mediastinum, lungs, and pleura are otherwise normal. A compression fracture of a lower thoracic vertebral body is stable. IMPRESSION: No acute abnormality. Electronically Signed   By: Dorise Bullion III M.D   On: 03/26/2018 21:34   Dg Hip Unilat With Pelvis 2-3 Views Left  Result Date: 03/26/2018 CLINICAL DATA:  [place] 1 week ago, LEFT hip pain. EXAM: DG HIP (WITH OR WITHOUT PELVIS) 2-3V LEFT COMPARISON:  CT abdomen and pelvis April 8 1,019 FINDINGS: Linear lucency intertrochanteric femur. Femoral head is intact and located. Osteopenia without destructive bony lesion. Moderate vascular calcification. Surgical clips project in the pelvis. IMPRESSION: Nondisplaced LEFT femur intertrochanteric fracture versus neurovascular channel. These results will be called to the ordering clinician or representative by the Radiologist Assistant, and communication documented in the PACS or zVision Dashboard. Electronically Signed   By: Elon Alas M.D.   On: 03/26/2018 14:40        Scheduled Meds: . amiodarone  200 mg Oral Daily  . docusate sodium  100 mg Oral BID  . feeding supplement (PRO-STAT SUGAR  FREE 64)  30 mL Oral Daily  . insulin aspart  0-5 Units Subcutaneous QHS  . insulin aspart  0-9 Units Subcutaneous TID WC  . linagliptin  5 mg Oral Daily  . multivitamin with minerals  1 tablet Oral Daily  . pantoprazole  40 mg Oral Daily  . pioglitazone  30 mg Oral Q breakfast  . sodium chloride flush  3 mL Intravenous Q12H   Continuous Infusions: . sodium chloride       LOS: 1 day    Time spent: 35 minutes.     Elmarie Shiley, MD [facility] Pager [phone]  If 7PM-7AM, please contact night-coverage [email or web address] Password [facility] 03/27/2018, 7:58 AM

## 2018-03-27 NOTE — Progress Notes (Signed)
ANTICOAGULATION CONSULT NOTE - Initial Consult  Pharmacy Consult for heparin Indication: atrial fibrillation  Allergies  Allergen Reactions  . Brilinta [Ticagrelor] Shortness Of Breath and Other (See Comments)    Changed to Plavix due to SOB  . Clindamycin/Lincomycin Anaphylaxis    Whole body skin peeling, blisters  . Doxycycline Anaphylaxis  . Penicillins Rash and Other (See Comments)    Has patient had a PCN reaction causing immediate rash, facial/tongue/throat swelling, SOB or lightheadedness with hypotension:Yes Has patient had a PCN reaction causing severe rash involving mucus membranes or skin necrosis: No Has patient had a PCN reaction that required hospitalization: No Has patient had a PCN reaction occurring within the last 10 years: No If all of the above answers are "NO", then may proceed with Cephalosporin use.  . Sulfa Antibiotics Other (See Comments)    Pt does not remember the reaction, but it sure the allergy exists.    Patient Measurements: Height: 5' 10"  (177.8 cm) Weight: 233 lb 4 oz (105.8 kg) IBW/kg (Calculated) : 73 Heparin Dosing Weight: 95 kg  Vital Signs: Temp: 98 F (36.7 C) (06/11 0610) Temp Source: Oral (06/11 0610) BP: 113/58 (06/11 0610) Pulse Rate: 67 (06/11 0610)  Labs: Recent Labs    03/26/18 2101 03/27/18 0547  HGB 9.2* 8.8*  HCT 30.7* 28.5*  PLT 223 212  APTT  --  40*  LABPROT  --  15.2  INR  --  1.21  CREATININE 2.05* 1.86*    Estimated Creatinine Clearance: 36.6 mL/min (A) (by C-G formula based on SCr of 1.86 mg/dL (H)).   Medical History: Past Medical History:  Diagnosis Date  . Allergic reaction caused by a drug 11/2017  . Anemia   . Atrial flutter ([place])    a. s/p RFCA of counterclockwise cavo-tricuspid isthmus dependent atrial flutter 2009 by Dr. Rayann Heman.  Marland Kitchen CAD (coronary artery disease) 06/2014   a. 06/2014: abnl nuc. Cath: Totally occluded LAD with faint collaterals (not a candidate for CTO PCI), 90% ramus s/p DES,  50-70% OM2.  . Chronic diastolic CHF (congestive heart failure) ([place])    a. 2D Echo 10/23/13: EF 50-55%, basal mid-inf HK, grade 2 DD, mild MR, mod dilated LA, no sig change from prior.  . CKD (chronic kidney disease), stage III ([place])   . Colostomy in place [facility])   . Complication of anesthesia    " during my kidney stone surgery my heart went out of rhythm  . Compression fracture of L1 lumbar vertebra ([name])   . DCM (dilated cardiomyopathy) ([place]) 02/27/2018   EF 35-40% by TEE 02/2018 felt tachycardia mediated  . Diabetes ([place])    TYPE 2   . Dyslipidemia   . Dyspnea   . Frequent PVCs   . GERD (gastroesophageal reflux disease)   . History of blood transfusion   . History of kidney stones   . History of peptic ulcer disease   . HTN (hypertension)   . Hx of acute renal failure 01/02/10-3/24-11   due to hypovolemic shock, gastroenteritis and dehydration,hospitalized . Did requre a few days of dialysis. Cr at discharge was 1.8  . Kidney disease   . Kidney stone may 2009   Right hydronephrosis, S/P stone removal  . Lower back pain   . Macular degeneration   . OA (osteoarthritis) of hip   . Obesity   . Osteopenia   . PAF (paroxysmal atrial fibrillation) ([name])    not on long term anticoagulation due to history of heme positive stools  and anemia  . Shingles    episode  . Small bowel obstruction, partial ([place]) 2009   Episode  . Ulcerative colitis ([place])    a. s/p total colectomy remotely.    Assessment: 84 yo on Eliquis PTA for Afib. Currently held for L-THA planned by Ortho. Last dose was on the evening of 6/9. To transition to IV heparin. Hgb 8.8, plts wnl.  Goal of Therapy:  Heparin level 0.3-0.7 units/ml  APTT 66-102 sec Monitor platelets by anticoagulation protocol: Yes   Plan:  Start heparin gtt at 1,400 units/hr Monitor daily aPTT and heparin level, CBC, s/s of bleed F/U surgery time  Elenor Quinones, PharmD, BCPS Clinical Pharmacist Phone number [phone] 03/27/2018 8:29  AM

## 2018-03-27 NOTE — Progress Notes (Signed)
Patient ID: [name] [name], male   DOB: 10/27/1932, 84 y.o.   MRN: [id number] The CT scan is reviewed and shows a superior and inferior pubic rami fracture on the left.  There is no intertrochanteric hip fracture.  This is consistent with his physical exam.  Surgery is canceled for tomorrow, orders are placed for physical therapy for progressive ambulation weightbearing as tolerated.

## 2018-03-27 NOTE — Plan of Care (Signed)
  Problem: Education: Goal: Knowledge of General Education information will improve Outcome: Progressing   Problem: Clinical Measurements: Goal: Ability to maintain clinical measurements within normal limits will improve Outcome: Progressing   Problem: Activity: Goal: Risk for activity intolerance will decrease Outcome: Progressing   Problem: Pain Managment: Goal: General experience of comfort will improve Outcome: Progressing   Problem: Safety: Goal: Ability to remain free from injury will improve Outcome: Progressing

## 2018-03-28 ENCOUNTER — Encounter (HOSPITAL_COMMUNITY)
Admission: EM | Disposition: A | Discharge status: Home Health | Payer: Self-pay | Source: Skilled Nursing Facility | Attending: Internal Medicine

## 2018-03-28 DIAGNOSIS — E1122 Type 2 diabetes mellitus with diabetic chronic kidney disease: Secondary | ICD-10-CM

## 2018-03-28 DIAGNOSIS — S329XXD Fracture of unspecified parts of lumbosacral spine and pelvis, subsequent encounter for fracture with routine healing: Secondary | ICD-10-CM

## 2018-03-28 DIAGNOSIS — I5022 Chronic systolic (congestive) heart failure: Secondary | ICD-10-CM

## 2018-03-28 DIAGNOSIS — I5042 Chronic combined systolic (congestive) and diastolic (congestive) heart failure: Secondary | ICD-10-CM

## 2018-03-28 DIAGNOSIS — S329XXA Fracture of unspecified parts of lumbosacral spine and pelvis, initial encounter for closed fracture: Secondary | ICD-10-CM | POA: Diagnosis present

## 2018-03-28 DIAGNOSIS — S32301A Unspecified fracture of right ilium, initial encounter for closed fracture: Secondary | ICD-10-CM

## 2018-03-28 DIAGNOSIS — N184 Chronic kidney disease, stage 4 (severe): Secondary | ICD-10-CM

## 2018-03-28 LAB — BASIC METABOLIC PANEL
Anion gap: 6 (ref 5–15)
BUN: 38 mg/dL — ABNORMAL HIGH (ref 6–20)
CO2: 25 mmol/L (ref 22–32)
Calcium: 8.2 mg/dL — ABNORMAL LOW (ref 8.9–10.3)
Chloride: 108 mmol/L (ref 101–111)
Creatinine, Ser: 1.48 mg/dL — ABNORMAL HIGH (ref 0.61–1.24)
GFR calc Af Amer: 49 mL/min — ABNORMAL LOW (ref >60)
GFR calc non Af Amer: 42 mL/min — ABNORMAL LOW (ref >60)
Glucose, Bld: 125 mg/dL — ABNORMAL HIGH (ref 65–99)
Potassium: 4.5 mmol/L (ref 3.5–5.1)
Sodium: 139 mmol/L (ref 135–145)

## 2018-03-28 LAB — GLUCOSE, CAPILLARY
Glucose-Capillary: 102 mg/dL — ABNORMAL HIGH (ref 65–99)
Glucose-Capillary: 133 mg/dL — ABNORMAL HIGH (ref 65–99)
Glucose-Capillary: 141 mg/dL — ABNORMAL HIGH (ref 65–99)
Glucose-Capillary: 153 mg/dL — ABNORMAL HIGH (ref 65–99)
Glucose-Capillary: 177 mg/dL — ABNORMAL HIGH (ref 65–99)
Glucose-Capillary: 178 mg/dL — ABNORMAL HIGH (ref 65–99)

## 2018-03-28 LAB — CBC
HCT: 29.9 % — ABNORMAL LOW (ref 39.0–52.0)
Hemoglobin: 9.1 g/dL — ABNORMAL LOW (ref 13.0–17.0)
MCH: 29.4 pg (ref 26.0–34.0)
MCHC: 30.4 g/dL (ref 30.0–36.0)
MCV: 96.8 fL (ref 78.0–100.0)
Platelets: 213 10*3/uL (ref 150–400)
RBC: 3.09 MIL/uL — ABNORMAL LOW (ref 4.22–5.81)
RDW: 16 % — ABNORMAL HIGH (ref 11.5–15.5)
WBC: 6.9 10*3/uL (ref 4.0–10.5)

## 2018-03-28 SURGERY — FIXATION, FRACTURE, INTERTROCHANTERIC, WITH INTRAMEDULLARY ROD
Anesthesia: General | Laterality: Left

## 2018-03-28 MED ORDER — FUROSEMIDE 40 MG PO TABS
40.0000 mg | ORAL_TABLET | Freq: Every day | ORAL | Status: DC
  Administered 2018-03-28 – 2018-03-29 (×2): 40 mg via ORAL
  Filled 2018-03-28 (×2): qty 1

## 2018-03-28 MED ORDER — ATORVASTATIN CALCIUM 40 MG PO TABS
40.0000 mg | ORAL_TABLET | Freq: Every day | ORAL | Status: DC
  Administered 2018-03-28 – 2018-03-29 (×2): 40 mg via ORAL
  Filled 2018-03-28 (×2): qty 1

## 2018-03-28 NOTE — Consult Note (Addendum)
Advanced Heart Failure Team Consult Note   Primary Physician: [name], MD PCP-Cardiologist:  [name], MD  Reason for Consultation: Heart Failure   HPI:    [name] [name] is seen today for evaluation of heart failure at the request of Dr Algis Liming.   Mr Pound is an 84 year old with a history of PAF, S/P TEE DC-CV 02/23/18, 2-vessel CAD with chronic total occlusion of the LAD with minimal collaterals and severe disease of the large ramus and normal LVF s/p DES to the ramus,bradycardia,  left ankle fracture 10/2017, hyperkalemia, and chronic systolic/diastolic HF admitted from SNF after fall.   Prior to admit he was at [facility]. Weights had been 230-232 pounds. He has had problems with leg edema.   Admitted 5/6 through 02/24/2018 with UTI, AKI and hyperkalemia. Initial rhythm was junctional rhythm due to hyperkalemia then developed AF with RVR. Spontaneously went back to NSR but then had recurrent AF and underwent TEE /DC-CV on 02/23/18. At the time of TEE EF noted to be 35-40% (was 50-55% on echo 4 days earlier when in NSR). Eventually d/c'd to SNF. F/u echo on 5/29 showed EF ~45%. At that point Dr. Radford Pax referred to HF Clinic.   Had mechanical fall at SNF on 6/4. Evaluated by PCP on 6/10 and sent to ED for additional care.  Ortho consulted and there concern for non displaced left inter trochanteric femur fracture. Cardiology consulted for and he was cleared for surgery. Yesterday he had CT to further evaluate and this showed pelvic fracture and no femur fracture so he did not require surgery. Pertinent admission labs included 4.4, creatinine 2.05, WBC 8.2, hgb 9.2 .   HF team consulted today because he had planned to see Dr Haroldine Laws as a new patient in the clinic tomorrow in referral from Dr. Radford Pax.  Denies SOB/chest pain.   LHC 06/2014 Totally occluded LAD with faint collaterals (not a candidate for CTO PCI), 90% ramus s/p DES, 50-70% OM2.  Echo 6/16 EF 50-55% RVSP 31 Echo  02/19/18 EF 50-55% TEE 02/23/18 EF 35-40% in setting of rapid AF  Echo 03/14/2018  Left ventricle: The cavity size was normal. There was mild   concentric hypertrophy. Systolic function was mildly to   moderately reduced ed. The estimated ejection fraction was in the   range of 40% to 45%. Diffuse hypokinesis. Doppler parameters are   consistent with abnormal left ventricular relaxation (grade 1   diastolic dysfunction). Doppler parameters are consistent with   indeterminate ventricular filling pressure. - Aortic valve: Transvalvular velocity was within the normal range.   There was no stenosis. There was trivial regurgitation.   Regurgitation pressure half-time: 793 ms. - Mitral valve: Transvalvular velocity was within the normal range.   There was no evidence for stenosis. - Left atrium: The atrium was severely dilated. - Right ventricle: The cavity size was normal. Wall thickness was   normal. Systolic function was normal. - Right atrium: The atrium was moderately dilated. - Tricuspid valve: There was mild regurgitation. - Pulmonary arteries: Systolic pressure was mildly increased. PA   peak pressure: 44 mm Hg (S). - Global longitudinal strain -12.5% (abnormal).   Review of Systems: [y] = yes, [ ]  = no   General: Weight gain [ ] ; Weight loss [ ] ; Anorexia [ ] ; Fatigue [ ] ; Fever [ ] ; Chills [ ] ; Weakness [Y ]  Cardiac: Chest pain/pressure [ ] ; Resting SOB [ ] ; Exertional SOB [ ] ; Orthopnea [ ] ; Pedal  Edema [Y ]; Palpitations [ ] ; Syncope [ ] ; Presyncope [ ] ; Paroxysmal nocturnal dyspnea[ ]   Pulmonary: Cough [ ] ; Wheezing[ ] ; Hemoptysis[ ] ; Sputum [ ] ; Snoring [ ]   GI: Vomiting[ ] ; Dysphagia[ ] ; Melena[ ] ; Hematochezia [ ] ; Heartburn[ ] ; Abdominal pain [ ] ; Constipation [ ] ; Diarrhea [ ] ; BRBPR [ ]   GU: Hematuria[ ] ; Dysuria [ ] ; Nocturia[ ]   Vascular: Pain in legs with walking [Y ]; Pain in feet with lying flat [ ] ; Non-healing sores [ ] ; Stroke [ ] ; TIA [ ] ; Slurred speech [ ] ;    Neuro: Headaches[ ] ; Vertigo[ ] ; Seizures[ ] ; Paresthesias[ ] ;Blurred vision [ ] ; Diplopia [ ] ; Vision changes [ ]   Ortho/Skin: Arthritis [ ] ; Joint pain [Y ]; Muscle pain [ ] ; Joint swelling [ ] ; Back Pain [ Y]; Rash [ ]   Psych: Depression[ ] ; Anxiety[ ]   Heme: Bleeding problems [ ] ; Clotting disorders [ ] ; Anemia [ ]   Endocrine: Diabetes [Y ]; Thyroid dysfunction[ ]   Home Medications Prior to Admission medications   Medication Sig Start Date End Date Taking? Authorizing Provider  Amino Acids-Protein Hydrolys (FEEDING SUPPLEMENT, PRO-STAT SUGAR FREE 64,) LIQD Take 30 mLs by mouth daily.   Yes [provider]  amiodarone (PACERONE) 200 MG tablet Take 1 tablet (200 mg total) by mouth daily. 07/03/17  Yes Turner, Eber Hong, MD  apixaban (ELIQUIS) 2.5 MG TABS tablet Take 1 tablet (2.5 mg total) by mouth 2 (two) times daily. Patient taking differently: Take 2.5 mg by mouth daily.  02/24/18  Yes Georgette Shell, MD  aspirin EC 81 MG EC tablet Take 1 tablet (81 mg total) by mouth daily. 02/25/18  Yes Georgette Shell, MD  calcium carbonate (OS-CAL) 600 MG TABS tablet Take 600 mg by mouth daily with breakfast.   Yes [provider]  Dermatological Products, Misc. (MOISTURE BARRIER) OINT Apply 1 application topically 3 (three) times daily. To buttocks/sacrum   Yes [provider]  docusate sodium (COLACE) 100 MG capsule Take 1 capsule (100 mg total) by mouth 2 (two) times daily. 10/27/17  Yes Doreatha Lew, MD  Multiple Vitamin (MULTIVITAMIN WITH MINERALS) TABS tablet Take 1 tablet by mouth daily.   Yes [provider]  omeprazole (PRILOSEC) 20 MG capsule Take 20 mg by mouth every evening.   Yes [provider]  pioglitazone (ACTOS) 30 MG tablet Take 1 tablet (30 mg total) by mouth daily. 11/05/14  Yes Dunn, Dayna N, PA-C  saxagliptin HCl (ONGLYZA) 5 MG TABS tablet Take 0.5 tablets (2.5 mg total) by mouth daily. 12/13/17  Yes Velvet Bathe, MD   acetaminophen (TYLENOL) 500 MG tablet Take 1 tablet (500 mg total) by mouth every 6 (six) hours as needed (for pain or headaches). Available over the counter Patient not taking: Reported on 03/26/2018 01/08/17   Modena Jansky, MD  furosemide (LASIX) 40 MG tablet Take 1 tablet (40 mg total) by mouth daily. Patient not taking: Reported on 03/26/2018 02/25/18   Georgette Shell, MD  hydrocortisone cream 1 % Apply 1 application topically 3 (three) times daily as needed for itching (skin irritation). Patient not taking: Reported on 03/26/2018 02/24/18   Georgette Shell, MD  oxyCODONE-acetaminophen (PERCOCET) 10-325 MG tablet Take 1 tablet by mouth every 4 (four) hours as needed for pain. Patient not taking: Reported on 03/26/2018 10/27/17   Patrecia Pour, Christean Grief, MD  tamsulosin [facility]) 0.4 MG CAPS capsule Take 1 capsule (0.4 mg total) by mouth daily after supper. Patient  not taking: Reported on 03/26/2018 10/27/14   Florencia Reasons, MD    Past Medical History: Past Medical History:  Diagnosis Date  . Allergic reaction caused by a drug 11/2017  . Anemia   . Atrial flutter ([place])    a. s/p RFCA of counterclockwise cavo-tricuspid isthmus dependent atrial flutter 2009 by Dr. Rayann Heman.  Marland Kitchen CAD (coronary artery disease) 06/2014   a. 06/2014: abnl nuc. Cath: Totally occluded LAD with faint collaterals (not a candidate for CTO PCI), 90% ramus s/p DES, 50-70% OM2.  . Chronic diastolic CHF (congestive heart failure) ([place])    a. 2D Echo 10/23/13: EF 50-55%, basal mid-inf HK, grade 2 DD, mild MR, mod dilated LA, no sig change from prior.  . CKD (chronic kidney disease), stage III ([place])   . Colostomy in place [facility])   . Complication of anesthesia    " during my kidney stone surgery my heart went out of rhythm  . Compression fracture of L1 lumbar vertebra ([name])   . DCM (dilated cardiomyopathy) ([place]) 02/27/2018   EF 35-40% by TEE 02/2018 felt tachycardia mediated  . Diabetes ([place])    TYPE 2   . Dyslipidemia   .  Dyspnea   . Frequent PVCs   . GERD (gastroesophageal reflux disease)   . History of blood transfusion   . History of kidney stones   . History of peptic ulcer disease   . HTN (hypertension)   . Hx of acute renal failure 01/02/10-3/24-11   due to hypovolemic shock, gastroenteritis and dehydration,hospitalized . Did requre a few days of dialysis. Cr at discharge was 1.8  . Kidney disease   . Kidney stone may 2009   Right hydronephrosis, S/P stone removal  . Lower back pain   . Macular degeneration   . OA (osteoarthritis) of hip   . Obesity   . Osteopenia   . PAF (paroxysmal atrial fibrillation) ([name])    not on long term anticoagulation due to history of heme positive stools and anemia  . Shingles    episode  . Small bowel obstruction, partial ([place]) 2009   Episode  . Ulcerative colitis ([place])    a. s/p total colectomy remotely.    Past Surgical History: Past Surgical History:  Procedure Laterality Date  . BACK SURGERY    . CARDIAC CATHETERIZATION  06/19/2014  . CARDIOVERSION N/A 02/23/2018   Procedure: CARDIOVERSION;  Surgeon: Dorothy Spark, MD;  Location: [facility] ENDOSCOPY;  Service: Cardiovascular;  Laterality: N/A;  . COLON RESECTION    . COLOSTOMY    . CORONARY ANGIOPLASTY    . CORONARY STENT PLACEMENT  06/19/2014   DES       dr Martinique  . HARDWARE REMOVAL Left 12/01/2017   Procedure: HARDWARE REMOVAL LEFT ANKLE;  Surgeon: Newt Minion, MD;  Location: [place];  Service: Orthopedics;  Laterality: Left;  . HERNIA REPAIR    . LEFT AND RIGHT HEART CATHETERIZATION WITH CORONARY ANGIOGRAM N/A 06/19/2014   Procedure: LEFT AND RIGHT HEART CATHETERIZATION WITH CORONARY ANGIOGRAM;  Surgeon: Peter M Martinique, MD;  Location: [facility] CATH LAB;  Service: Cardiovascular;  Laterality: N/A;  . ORIF ANKLE FRACTURE Left 10/25/2017   Procedure: OPEN REDUCTION INTERNAL FIXATION (ORIF) LEFT ANKLE FRACTURE;  Surgeon: Newt Minion, MD;  Location: [place];  Service: Orthopedics;  Laterality: Left;  . TEE  WITHOUT CARDIOVERSION N/A 02/23/2018   Procedure: TRANSESOPHAGEAL ECHOCARDIOGRAM (TEE);  Surgeon: Dorothy Spark, MD;  Location: [place];  Service: Cardiovascular;  Laterality: N/A;  .  TONSILLECTOMY      Family History: Family History  Problem Relation Age of Onset  . Colon cancer Mother     Social History: Social History   Socioeconomic History  . Marital status: Widowed    Spouse name: Not on file  . Number of children: Not on file  . Years of education: Not on file  . Highest education level: Not on file  Occupational History  . Not on file  Social Needs  . Financial resource strain: Not on file  . Food insecurity:    Worry: Not on file    Inability: Not on file  . Transportation needs:    Medical: Not on file    Non-medical: Not on file  Tobacco Use  . Smoking status: Never Smoker  . Smokeless tobacco: Never Used  Substance and Sexual Activity  . Alcohol use: No  . Drug use: No  . Sexual activity: Not on file  Lifestyle  . Physical activity:    Days per week: Not on file    Minutes per session: Not on file  . Stress: Not on file  Relationships  . Social connections:    Talks on phone: Not on file    Gets together: Not on file    Attends religious service: Not on file    Active member of club or organization: Not on file    Attends meetings of clubs or organizations: Not on file    Relationship status: Not on file  Other Topics Concern  . Not on file  Social History Narrative  . Not on file    Allergies:  Allergies  Allergen Reactions  . Brilinta [Ticagrelor] Shortness Of Breath and Other (See Comments)    Changed to Plavix due to SOB  . Clindamycin/Lincomycin Anaphylaxis    Whole body skin peeling, blisters  . Doxycycline Anaphylaxis  . Penicillins Rash and Other (See Comments)    PATIENT HAS HAD A PCN REACTION WITH IMMEDIATE RASH, FACIAL/TONGUE/THROAT SWELLING, SOB, OR LIGHTHEADEDNESS WITH HYPOTENSION:  #  #  YES  #  #  Has patient had a  PCN reaction causing severe rash involving mucus membranes or skin necrosis: No Has patient had a PCN reaction that required hospitalization: No Has patient had a PCN reaction occurring within the last 10 years: No  . Sulfa Antibiotics Other (See Comments)    Pt does not remember the reaction, but it sure the allergy exists. UNSPECIFIED REACTION     Objective:    Vital Signs:   Temp:  [97.6 F (36.4 C)-98.6 F (37 C)] 97.6 F (36.4 C) (06/12 0355) Pulse Rate:  [59-70] 59 (06/12 0355) Resp:  [18-20] 20 (06/12 0355) BP: (86-117)/(36-45) 112/41 (06/12 0355) SpO2:  [99 %-100 %] 99 % (06/12 0355) Last BM Date: 03/26/18  Weight change: Filed Weights   03/26/18 1828 03/27/18 0000 03/27/18 0500  Weight: 232 lb (105.2 kg) 233 lb 4 oz (105.8 kg) 233 lb 4 oz (105.8 kg)    Intake/Output:   Intake/Output Summary (Last 24 hours) at 03/28/2018 1104 Last data filed at 03/28/2018 0900 Gross per 24 hour  Intake 806.33 ml  Output 500 ml  Net 306.33 ml      Physical Exam    General:   No resp difficulty. NAD. In bed.  HEENT: normal anicteric  Neck: supple. JVP 6-7. Carotids 2+ bilat; no bruits. No lymphadenopathy or thyromegaly appreciated. Cor: PMI nondisplaced. Regular rate & rhythm. No rubs, gallops or murmurs. Lungs: clear no wheeze  Abdomen: obese, soft, nontender, nondistended. No hepatosplenomegaly. No bruits or masses. Good bowel sounds. Extremities: no cyanosis, clubbing, rash, R and LLE trace-1+ edema Neuro: alert & orientedx3, cranial nerves grossly intact. moves all 4 extremities w/o difficulty. Affect pleasant   Telemetry   SR 70-80s   EKG    SR 78 bpm wit PACs and IVCD. Personally reviewed  Labs   Basic Metabolic Panel: Recent Labs  Lab 03/26/18 2101 03/27/18 0547 03/28/18 0701  NA 137 140 139  K 4.4 4.1 4.5  CL 106 107 108  CO2 22 25 25   GLUCOSE 138* 135* 125*  BUN 49* 48* 38*  CREATININE 2.05* 1.86* 1.48*  CALCIUM 8.3* 8.2* 8.2*    Liver Function  Tests: Recent Labs  Lab 03/27/18 0547  AST 26  ALT 20  ALKPHOS 114  BILITOT 0.5  PROT 5.4*  ALBUMIN 2.0*   No results for input(s): LIPASE, AMYLASE in the last 168 hours. No results for input(s): AMMONIA in the last 168 hours.  CBC: Recent Labs  Lab 03/26/18 2101 03/27/18 0547 03/28/18 0701  WBC 8.2 6.8 6.9  NEUTROABS 5.8  --   --   HGB 9.2* 8.8* 9.1*  HCT 30.7* 28.5* 29.9*  MCV 96.8 95.6 96.8  PLT 223 212 213    Cardiac Enzymes: No results for input(s): CKTOTAL, CKMB, CKMBINDEX, TROPONINI in the last 168 hours.  BNP: BNP (last 3 results) Recent Labs    02/19/18 0347  BNP 488.1*    ProBNP (last 3 results) No results for input(s): PROBNP in the last 8760 hours.   CBG: Recent Labs  Lab 03/27/18 1602 03/27/18 2110 03/28/18 0040 03/28/18 0626 03/28/18 0847  GLUCAP 134* 166* 141* 133* 177*    Coagulation Studies: Recent Labs    03/27/18 0547  LABPROT 15.2  INR 1.21     Imaging   Ct Hip Left Wo Contrast  Result Date: 03/27/2018 CLINICAL DATA:  Left hip pain.  Multiple recent falls. EXAM: CT OF THE LEFT HIP WITHOUT CONTRAST TECHNIQUE: Multidetector CT imaging of the left hip was performed according to the standard protocol. Multiplanar CT image reconstructions were also generated. COMPARISON:  Radiographs dated 03/26/2018 and CT scan of abdomen and pelvis dated 01/22/2018 FINDINGS: Bones/Joint/Cartilage There is a nondisplaced fracture left superior pubic ramus, best seen on series 9. There is a slightly displaced fracture of the left inferior pubic ramus best seen on image 87 of series 6. The proximal left femur is intact. The linear lucencies in the intertrochanteric region on the radiographs are demonstrated to be benign vascular channels. 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 The visualized portion of the sacrum is intact. There is prominent fluid in the left greater trochanteric bursa with edema in the subcutaneous  fat lateral to the left hip. There is moderate atrophy of the left gluteal muscles. IMPRESSION: 1. Fractures of the left inferior and superior pubic rami. 2. 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 3. No fracture of the proximal left femur. 4. Left greater trochanteric bursitis. Electronically Signed   By: Lorriane Shire M.D.   On: 03/27/2018 11:46      Medications:     Current Medications: . amiodarone  200 mg Oral Daily  . apixaban  2.5 mg Oral BID  . aspirin EC  81 mg Oral Daily  . docusate sodium  100 mg Oral BID  . feeding supplement (PRO-STAT SUGAR FREE 64)  30 mL Oral Daily  .  insulin aspart  0-5 Units Subcutaneous QHS  . insulin aspart  0-9 Units Subcutaneous TID WC  . linagliptin  5 mg Oral Daily  . multivitamin with minerals  1 tablet Oral Daily  . pantoprazole  40 mg Oral Daily  . sodium chloride flush  3 mL Intravenous Q12H     Infusions: . sodium chloride         Patient Profile   Mr Smyers is an 84 year old with a history of PAF, S/P TEE DC-CV 02/23/18, CAD vessel ASCAD with chronic total occlusion of the LAD with minimal collaterals and severe disease of the large ramus and normal LVF s/p DES to the ramus, chronic diastolic HF, hyperkalemia admitted from SNF after fall.   Assessment/Plan   1. Fall, mechanical --> pelvis fracture Ortho following.  No plan for surgical intervention. Per Dr Sharol Given he can be WBAT.   2. Chronic Systolic/Diastolic Heart Failure 3/00/7622 TEE 35-40%  03/14/2018 ECHO EF 40-45% Grade IDD Volume status mildly elevated. Restart lasix 40 mg po lasix. -He does not need potassium or spironolactone with history of hyperkalemia during last admit.  - Hold off on bb for now  3. PAF - S/P DC-CV 5/10 - Continue amio and elqius 5 mg twice a day.   4. CAD  No s/s ischemia  On aspirin, eliquis, and will start statin.  LHC  2015 2 vessel ASCAD with chronic total occlusion of the LAD with minimal collaterals and severe disease  of the large ramus and normal LVF s/p DES to the ramus.  5. HTN  Stable.   6. CKD Stage III Creatinine 1.7-1.9  Renal function stable. BMET in am  7. DNR   Length of Stay: 2  Amy Clegg, NP  03/28/2018, 11:04 AM  Advanced Heart Failure Team Pager [phone] (M-F; 7a - 4p)  Please contact [facility] for night-coverage after hours (4p -7a ) and weekends on [email or web address]  84 y/o male as above with h/o PAF, CAD, CKD and diastolic HF followed by Dr. Radford Pax.   All echo and cath images reviewed personally. He has CTO LAD and baseline EF 50-55%. Had echo 02/19/18 with EF 50-55%. Several days later admitted for UTI, AKI and hyperkalemia. Initial rhythm was junctional rhythm due to hyperkalemia then developed AF with RVR. Spontaneously went back to NSR but then had recurrent AF and underwent TEE /DC-CV on 02/23/18). At the time of TEE EF noted to be 35-40%. Eventually d/c'd to SNF. Then discharged to ALF [facility]). He denies having edema at SNF but developed significant LE edema at ALF due to high-salt diet. Repeat echo 03/14/18 shows EF 40-45%.  Admitted from ALF with fall. Found to have pelvic fracture. Edema now much improved in hospital. Deneis CP, orthopnea or PND. Trace-1+ edema on exam. Currently in NSR  Suspect drop in EF last month due to PAF with RVR and EF now returning to baseline. Would continue amiodarone to maintain NSR. Volume status worsened due to high-salt diet at ALF. Now improved. Agree with restarting po lasix. I do not think he will need Advanced HF services. He will need to watch his salt intake and adjust diuretics accordingly. As [place] typically has higher salt diet may need to use lasix 40 bid with sliding scale.   CAD appears stable and would continue to manage medically.   Of note, with age > 23 and serum creatinine persistently 1.5 or greater would drop Eliquis to 2.5 bid.  The HF team will sign off. He  should f/u with [facility] at discharge and we can see him back as  needed.   Glori Bickers, MD  5:54 PM

## 2018-03-28 NOTE — Progress Notes (Signed)
PROGRESS NOTE   [name] [name]  OJJ:[id number]    DOB: 06-08-1933    DOA: 03/26/2018  PCP: [name], MD   I have briefly reviewed patients previous medical records in [facility].  Brief Narrative:  84 year old male with PMH of PAF, s/p TEE DCCV 02/23/2018, CAD, chronic systolic and diastolic CHF, HTN, HLD, DM 2, GERD, stage III chronic kidney disease, left ankle fracture (Dr. Sharol Given, orthopedics) recent hospitalization 02/19/2018-02/24/2018 for hyperkalemia, A. fib with RVR when he underwent the cardioversion, urinary retention that required temporary Foley catheter sustained mechanical fall on 6/4, evaluated by PCP and sent to ED for further evaluation.  Orthopedics consulted and initial concern for left intertrochanteric femur fracture, cardiology consulted for preop clearance, subsequent CT however showed only pelvic fracture and no femur fracture hence no surgery planned.  Heart failure team consulted 6/12.   Assessment & Plan:   Principal Problem:   Pelvic fracture ([name]) Active Problems:   Type II diabetes mellitus with renal manifestations ([name])   Severe protein-calorie malnutrition ([name])   Diabetic polyneuropathy associated with type 2 diabetes mellitus ([name])   Mechanical fall and pelvic fracture (left superior and inferior pubic rami fractures) - CT of left hip 6/11 confirmed fractures of left inferior and superior pubic rami and bilateral avascular necrosis of femoral heads but no fracture of proximal left femur. - Dr. Sharol Given, orthopedics follow-up 6/12 appreciated: Fractures should heal uneventfully on their own, weightbearing as tolerated on left, PT evaluation and outpatient follow-up in office with him in 4 weeks. - PT recommends SNF.  Clinical social work consultation to determine if prior ALF can manage current levels of care recommended by PT or will require new SNF.  Chronic systolic and diastolic CHF - Heart failure team consulted 6/12.  Patient was supposed to see  them in office 6/13.  Patient and spouse insisted consultation. - TTE 03/14/2018: LVEF 40-45%. - Mild volume overload.  Lasix 40 mg orally daily restarted.  No potassium supplements or Aldactone due to hyperkalemia history. - Holding off beta-blockers.  Paroxysmal atrial fibrillation Status post DCCV 5/10.  Continue amiodarone and Eliquis (was briefly on IV heparin while surgery was being contemplated).  CAD Asymptomatic.  Continue aspirin, statins and currently on Eliquis.  Management per cardiology.  Essential hypertension Soft blood pressures although asymptomatic.  Monitor closely.  DM 2 with renal complications Reasonable inpatient control.  Continue SSI and remains on Tradjenta.  A1c 6.7.  Acute on stage III chronic kidney disease Baseline creatinine probably in the 1.5-1.7 range.  Presented with creatinine of 2.05 which has improved to 1.48.  Acute kidney injury resolved.  Follow BMP closely.  Normocytic anemia May be anemia of chronic disease or due to chronic kidney disease.  Stable.     DVT prophylaxis: Eliquis Code Status: DNR Family Communication: Discussed in detail with patient's sister at bedside.  Updated care and answered questions. Disposition: DC to SNF versus ALF pending clinical improvement, possibly 6/13.   Consultants:  Orthopedics. Cardiology. Heart failure team  Procedures:  None  Antimicrobials:  None   Subjective: Patient reports chronic lower extremity edema, has appointment to see Dr. Haroldine Laws 6/13 in clinic and asks for them to see him in the hospital today.  No chest pain or dyspnea or palpitations.  Left hip pain mostly on weightbearing.  ROS: As above.  Objective:  Vitals:   03/28/18 0036 03/28/18 0355 03/28/18 1437 03/28/18 1439  BP: (!) 86/36 (!) 112/41 (!) 89/64 (!) 94/25  Pulse: 60 (!)  59 69 67  Resp:  20 18   Temp: 98.6 F (37 C) 97.6 F (36.4 C) (!) 97.3 F (36.3 C)   TempSrc: Axillary Oral Oral   SpO2: 100% 99% 98%  98%  Weight:      Height:        Examination:  General exam: Does not elderly male, moderately built and overweight, lying comfortably propped up in bed. Respiratory system: Clear to auscultation. Respiratory effort normal. Cardiovascular system: S1 & S2 heard, RRR. No JVD, murmurs, rubs, gallops or clicks.  Trace bilateral leg edema.  Telemetry personally reviewed: Sinus rhythm with first-degree AV block. Gastrointestinal system: Abdomen is nondistended, soft and nontender. No organomegaly or masses felt. Normal bowel sounds heard. Central nervous system: Alert and oriented. No focal neurological deficits. Extremities: Symmetric 5 x 5 power. Skin: No rashes, lesions or ulcers Psychiatry: Judgement and insight appear somewhat impaired.  Mood & affect appropriate.     Data Reviewed: I have personally reviewed following labs and imaging studies  CBC: Recent Labs  Lab 03/26/18 2101 03/27/18 0547 03/28/18 0701  WBC 8.2 6.8 6.9  NEUTROABS 5.8  --   --   HGB 9.2* 8.8* 9.1*  HCT 30.7* 28.5* 29.9*  MCV 96.8 95.6 96.8  PLT 223 212 742   Basic Metabolic Panel: Recent Labs  Lab 03/26/18 2101 03/27/18 0547 03/28/18 0701  NA 137 140 139  K 4.4 4.1 4.5  CL 106 107 108  CO2 22 25 25   GLUCOSE 138* 135* 125*  BUN 49* 48* 38*  CREATININE 2.05* 1.86* 1.48*  CALCIUM 8.3* 8.2* 8.2*   Liver Function Tests: Recent Labs  Lab 03/27/18 0547  AST 26  ALT 20  ALKPHOS 114  BILITOT 0.5  PROT 5.4*  ALBUMIN 2.0*   Coagulation Profile: Recent Labs  Lab 03/27/18 0547  INR 1.21    HbA1C: Recent Labs    03/27/18 0547  HGBA1C 6.7*   CBG: Recent Labs  Lab 03/27/18 2110 03/28/18 0040 03/28/18 0626 03/28/18 0847 03/28/18 1243  GLUCAP 166* 141* 133* 177* 102*    No results found for this or any previous visit (from the past 240 hour(s)).       Radiology Studies: Dg Chest 2 View  Result Date: 03/26/2018 CLINICAL DATA:  Preoperative chest x-ray EXAM: CHEST - 2 VIEW  COMPARISON:  Feb 19, 2018 FINDINGS: Stable cardiomegaly. Mild atelectasis in the left base. The hila, mediastinum, lungs, and pleura are otherwise normal. A compression fracture of a lower thoracic vertebral body is stable. IMPRESSION: No acute abnormality. Electronically Signed   By: Dorise Bullion III M.D   On: 03/26/2018 21:34   Ct Hip Left Wo Contrast  Result Date: 03/27/2018 CLINICAL DATA:  Left hip pain.  Multiple recent falls. EXAM: CT OF THE LEFT HIP WITHOUT CONTRAST TECHNIQUE: Multidetector CT imaging of the left hip was performed according to the standard protocol. Multiplanar CT image reconstructions were also generated. COMPARISON:  Radiographs dated 03/26/2018 and CT scan of abdomen and pelvis dated 01/22/2018 FINDINGS: Bones/Joint/Cartilage There is a nondisplaced fracture left superior pubic ramus, best seen on series 9. There is a slightly displaced fracture of the left inferior pubic ramus best seen on image 87 of series 6. The proximal left femur is intact. The linear lucencies in the intertrochanteric region on the radiographs are demonstrated to be benign vascular channels. 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 The visualized portion of the sacrum is intact. There is prominent  fluid in the left greater trochanteric bursa with edema in the subcutaneous fat lateral to the left hip. There is moderate atrophy of the left gluteal muscles. IMPRESSION: 1. Fractures of the left inferior and superior pubic rami. 2. 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 3. No fracture of the proximal left femur. 4. Left greater trochanteric bursitis. Electronically Signed   By: Lorriane Shire M.D.   On: 03/27/2018 11:46        Scheduled Meds: . amiodarone  200 mg Oral Daily  . apixaban  2.5 mg Oral BID  . aspirin EC  81 mg Oral Daily  . atorvastatin  40 mg Oral q1800  . docusate sodium  100 mg Oral BID  . feeding supplement (PRO-STAT SUGAR FREE 64)   30 mL Oral Daily  . furosemide  40 mg Oral Daily  . insulin aspart  0-5 Units Subcutaneous QHS  . insulin aspart  0-9 Units Subcutaneous TID WC  . linagliptin  5 mg Oral Daily  . multivitamin with minerals  1 tablet Oral Daily  . pantoprazole  40 mg Oral Daily  . sodium chloride flush  3 mL Intravenous Q12H   Continuous Infusions: . sodium chloride       LOS: 2 days     Vernell Leep, MD, FACP, [facility]. [facility] Pager [phone]  If 7PM-7AM, please contact night-coverage [email or web address] Password [facility] 03/28/2018, 2:45 PM

## 2018-03-28 NOTE — Progress Notes (Signed)
Patient ID: [name] [name], male   DOB: 1933-03-06, 84 y.o.   MRN: [id number] CT scan of the left hip was reviewed shows no intertrochanteric or femoral neck fracture patient does have left inferior and superior pubic rami fractures.  These should heal uneventfully on their own.  Patient is to begin physical therapy weightbearing as tolerated on the left.  Patient does have increased swelling in both legs.  Will need compression or fluid management.  I will follow-up in the office in 4 weeks.

## 2018-03-28 NOTE — Plan of Care (Signed)
  Problem: Education: Goal: Knowledge of General Education information will improve Outcome: Progressing   Problem: Clinical Measurements: Goal: Ability to maintain clinical measurements within normal limits will improve Outcome: Progressing   Problem: Clinical Measurements: Goal: Cardiovascular complication will be avoided Outcome: Progressing   Problem: Activity: Goal: Risk for activity intolerance will decrease Outcome: Progressing   Problem: Pain Managment: Goal: General experience of comfort will improve Outcome: Progressing

## 2018-03-29 ENCOUNTER — Encounter (HOSPITAL_COMMUNITY): Payer: Medicare Other | Admitting: Internal Medicine

## 2018-03-29 ENCOUNTER — Other Ambulatory Visit: Payer: Self-pay

## 2018-03-29 DIAGNOSIS — S32509S Unspecified fracture of unspecified pubis, sequela: Secondary | ICD-10-CM

## 2018-03-29 LAB — BASIC METABOLIC PANEL
Anion gap: 9 (ref 5–15)
BUN: 33 mg/dL — ABNORMAL HIGH (ref 6–20)
CO2: 26 mmol/L (ref 22–32)
Calcium: 8.2 mg/dL — ABNORMAL LOW (ref 8.9–10.3)
Chloride: 102 mmol/L (ref 101–111)
Creatinine, Ser: 1.36 mg/dL — ABNORMAL HIGH (ref 0.61–1.24)
GFR calc Af Amer: 54 mL/min — ABNORMAL LOW (ref >60)
GFR calc non Af Amer: 46 mL/min — ABNORMAL LOW (ref >60)
Glucose, Bld: 134 mg/dL — ABNORMAL HIGH (ref 65–99)
Potassium: 4.5 mmol/L (ref 3.5–5.1)
Sodium: 137 mmol/L (ref 135–145)

## 2018-03-29 LAB — GLUCOSE, CAPILLARY
Glucose-Capillary: 119 mg/dL — ABNORMAL HIGH (ref 65–99)
Glucose-Capillary: 125 mg/dL — ABNORMAL HIGH (ref 65–99)
Glucose-Capillary: 134 mg/dL — ABNORMAL HIGH (ref 65–99)
Glucose-Capillary: 151 mg/dL — ABNORMAL HIGH (ref 65–99)

## 2018-03-29 MED ORDER — ACETAMINOPHEN 325 MG PO TABS: 650.0000 mg | ORAL_TABLET | Freq: Four times a day (QID) | ORAL | Status: AC | PRN

## 2018-03-29 MED ORDER — ATORVASTATIN CALCIUM 40 MG PO TABS: 40.0000 mg | ORAL_TABLET | Freq: Every day | ORAL | 0 refills | Status: AC

## 2018-03-29 MED ORDER — FUROSEMIDE 40 MG PO TABS: 40.0000 mg | ORAL_TABLET | Freq: Every day | ORAL | 0 refills | Status: DC

## 2018-03-29 NOTE — Clinical Social Work Note (Signed)
Clinical Social Work Assessment  Patient Details  Name: [name] [name] MRN: [id number] Date of Birth: 1933-01-29  Date of referral:  03/29/18               Reason for consult:  Facility Placement                Permission sought to share information with:  Facility Art therapist granted to share information::  Yes, Verbal Permission Granted  Name::        Agency::  Assisted Living  Relationship::     Contact Information:     Housing/Transportation Living arrangements for the past 2 months:  [place] of Information:  Patient, Siblings Patient Interpreter Needed:  None Criminal Activity/Legal Involvement Pertinent to Current Situation/Hospitalization:  No - Comment as needed Significant Relationships:  Other Family Members, Siblings Lives with:  Facility Resident Do you feel safe going back to the place where you live?  Yes Need for family participation in patient care:  No (Coment)  Care giving concerns: Pt from [facility] and will need rehab and can return to assisted living.  Social Worker assessment / plan:  CSW met with patient at bedside and he indicated that he already set up rehabilitative therapies with his assisted living-[facility] and would like to return there for care.  CSW explained process and placement back at nursing facility. CSW obtained permission to send referral to ALF.  CSW will f/u for disposition.  Employment status:  Retired Forensic scientist:  Medicare PT Recommendations:  [place] / Referral to community resources:  [place]  Patient/Family's Response to care:  Pt appreciative of [facility] and feels that everything is set up for him to return to ALF. CSW assured patient that CSW will need to dot he referral and confirm. Pt in agreement.  Patient/Family's Understanding of and Emotional Response to Diagnosis, Current Treatment, and Prognosis:  Pt has good  understanding of diagnosis and are in agreement that patient will need SNF. Son hopeful that patient will return to baseline and get back to ALF.   Emotional Assessment Appearance:  Appears stated age Attitude/Demeanor/Rapport:  (Cooperative) Affect (typically observed):  Accepting, Appropriate Orientation:  Oriented to Situation, Oriented to  Time, Oriented to Place, Oriented to Self Alcohol / Substance use:  Not Applicable Psych involvement (Current and /or in the community):  No (Comment)  Discharge Needs  Concerns to be addressed:  Discharge Planning Concerns Readmission within the last 30 days:  No Current discharge risk:  Dependent with Mobility, Physical Impairment Barriers to Discharge:  No Barriers Identified   Normajean Baxter, LCSW 03/29/2018, 10:11 AM

## 2018-03-29 NOTE — Social Work (Addendum)
CSW called [facility] and spoke with nurse Santa Genera about the rehabilitative therapies and return to facility. Santa Genera indicated she will review clinical's and call CSW back to confirm.  CSW will f/u and await call back.  CSW f/u for disposition.  1:26pm: CSW spoke to [place] at ALF and she is waiting for his clinicals to be reviewed and she will call back as soon as she receives a response.  CSW will need to update FL[facility] if ALF can accept patient for rehab.  Elissa Hefty, LCSW Clinical Social Worker [phone]

## 2018-03-29 NOTE — Progress Notes (Addendum)
Pt is going back to ALF [facility], report was given to [place]. Waiting for [facility] for transportation. 1830 A copy of discharge instructions given to pt, still waiting for [facility]. 1910 Discharged pt to ALF via [facility].

## 2018-03-29 NOTE — Discharge Summary (Signed)
Physician Discharge Summary  [name] [name] CBJ:[id number] DOB: 1932-10-27  PCP: [name] Low, MD  Admit date: 03/26/2018 Discharge date: 03/29/2018  Recommendations for Outpatient Follow-up:  1. MD at ALF in 5 days with repeat labs (CBC & BMP). 2. Dr. Meridee [name], Orthopedics in 4 weeks. 3. [name] Dopp, PA-C/Cardiology on April 11, 2018 at 8:45 AM. 4. Dr. Wenda [name], PCP upon discharge from ALF.   Home Health: PT and OT.  As per clinical social work, patient will be discharged to [name] [name] who can provide patient's required level of care. Equipment/Devices: None  Discharge Condition: Improved and stable CODE STATUS: DNR Diet recommendation: Heart healthy & diabetic diet.  Discharge Diagnoses:  Principal Problem:   Pelvic fracture ([name]) Active Problems:   Type II diabetes mellitus with renal manifestations ([name])   Severe protein-calorie malnutrition ([name])   Diabetic polyneuropathy associated with type 2 diabetes mellitus ([place])   Brief Summary: 84 year old male with PMH of PAF, s/p TEE DCCV 02/23/2018, CAD, chronic systolic and diastolic CHF, HTN, HLD, DM 2, GERD, stage III chronic kidney disease, left ankle fracture ([name] [name], orthopedics) recent hospitalization 02/19/2018-02/24/2018 for hyperkalemia, A. fib with RVR when he underwent the cardioversion, urinary retention that required temporary Foley catheter sustained mechanical fall on 6/4, evaluated by PCP and sent to ED for further evaluation.  Orthopedics consulted and initial concern for left intertrochanteric femur fracture, cardiology consulted for preop clearance, subsequent CT however showed only pelvic fracture and no femur fracture hence no surgery planned.  Heart failure team consulted 6/12.   Assessment & Plan:   Mechanical fall and pelvic fracture (left superior and inferior pubic rami fractures) - CT of left hip 6/11 confirmed fractures of left inferior and superior pubic rami and bilateral  avascular necrosis of femoral heads but no fracture of proximal left femur. - [name] [name], orthopedics follow-up 6/12 appreciated: Fractures should heal uneventfully on their own, weightbearing as tolerated on left, PT evaluation and outpatient follow-up in office with him in 4 weeks. - PT recommends SNF.  As per clinical social work, patient will return to prior ALF who can provide level of care and therapy recommended by PT. -Clinically improved without significant pain and has not used much pain medicines here.  Chronic systolic and diastolic CHF - Heart failure team consulted 6/12.  Patient was supposed to see them in office 6/13.  Patient and spouse insisted consultation. - TTE 03/14/2018: LVEF 40-45%. - Mild volume overload.  Lasix 40 mg orally daily restarted.  No potassium supplements or Aldactone due to hyperkalemia history. - Holding off beta-blockers. -As per cardiology follow-up, suspect dropping EF last month due to PAF with RVR and EF now returning to baseline.  Recommended continuing amiodarone to maintain normal sinus rhythm.  Suspect volume status worsened due to high salt diet at ALF, now improved.  They did not think that he needs advanced heart failure services.  He will need to watch his salt intake and adjust diuretics accordingly.  As [name] [name] typically has higher salt diet may need to use Lasix 40 mg twice daily with sliding scale-we will defer this to follow-up by MD at ALF.  Paroxysmal atrial fibrillation Status post DCCV 5/10.  Continue amiodarone and Eliquis (was briefly on IV heparin while surgery was being contemplated).  As per cardiology, patient to be on Eliquis 2.5 mg twice daily adjusted to his age and renal insufficiency.  CAD Asymptomatic.  Continue aspirin, statins and currently on Eliquis.  Management per cardiology.  Continue  medical management.  Essential hypertension Automatic blood pressure checks in the hospital at times have been erroneous as was  probably the BP of 88/32.  Stable.  DM 2 with renal complications Reasonable inpatient control.  A1c 6.7.  Actos discontinued PTA.  Continue Onglyza.  Follow CBGs closely at SNF.  Acute on stage III chronic kidney disease Baseline creatinine probably in the 1.5-1.7 range.  Presented with creatinine of 2.05 which has improved to 1.48 >1.36.  Acute kidney injury resolved.  Follow BMP closely at ALF.  Normocytic anemia May be anemia of chronic disease or due to chronic kidney disease.  Stable.  Asymptomatic bacteriuria/pyuria Patient denies dysuria, urinary frequency, lower abdominal pain, fever or chills.  No treatment initiated.  Consultants:  Orthopedics. Cardiology. Heart failure team  Procedures:  None    Discharge Instructions  Discharge Instructions    (HEART FAILURE PATIENTS) Call MD:  Anytime you have any of the following symptoms: 1) 3 pound weight gain in 24 hours or 5 pounds in 1 week 2) shortness of breath, with or without a dry hacking cough 3) swelling in the hands, feet or stomach 4) if you have to sleep on extra pillows at night in order to breathe.   Complete by:  As directed    Call MD for:  difficulty breathing, headache or visual disturbances   Complete by:  As directed    Call MD for:  extreme fatigue   Complete by:  As directed    Call MD for:  persistant dizziness or light-headedness   Complete by:  As directed    Call MD for:  severe uncontrolled pain   Complete by:  As directed    Diet - [name] sodium heart healthy   Complete by:  As directed    Diet Carb Modified   Complete by:  As directed    Increase activity slowly   Complete by:  As directed    Weight bearing as tolerated   Complete by:  As directed    Laterality:  left   Extremity:  Lower       Medication List    STOP taking these medications   hydrocortisone cream 1 %   oxyCODONE-acetaminophen 10-325 MG tablet Commonly known as:  PERCOCET   pioglitazone 30 MG tablet Commonly  known as:  ACTOS   tamsulosin 0.4 MG Caps capsule Commonly known as:  FLOMAX     TAKE these medications   acetaminophen 325 MG tablet Commonly known as:  TYLENOL Take 2 tablets (650 mg total) by mouth every 6 (six) hours as needed for mild pain, moderate pain or headache. What changed:    medication strength  how much to take  reasons to take this  additional instructions   amiodarone 200 MG tablet Commonly known as:  PACERONE Take 1 tablet (200 mg total) by mouth daily.   apixaban 2.5 MG Tabs tablet Commonly known as:  ELIQUIS Take 1 tablet (2.5 mg total) by mouth 2 (two) times daily. What changed:  when to take this   aspirin 81 MG EC tablet Take 1 tablet (81 mg total) by mouth daily.   atorvastatin 40 MG tablet Commonly known as:  LIPITOR Take 1 tablet (40 mg total) by mouth daily at 6 PM.   calcium carbonate 600 MG Tabs tablet Commonly known as:  OS-CAL Take 600 mg by mouth daily with breakfast.   docusate sodium 100 MG capsule Commonly known as:  COLACE Take 1 capsule (100 mg total) by  mouth 2 (two) times daily.   feeding supplement (PRO-STAT SUGAR FREE 64) Liqd Take 30 mLs by mouth daily.   furosemide 40 MG tablet Commonly known as:  LASIX Take 1 tablet (40 mg total) by mouth daily.   MOISTURE BARRIER Oint Apply 1 application topically 3 (three) times daily. To buttocks/sacrum   multivitamin with minerals Tabs tablet Take 1 tablet by mouth daily.   omeprazole 20 MG capsule Commonly known as:  PRILOSEC Take 20 mg by mouth every evening.   saxagliptin HCl 5 MG Tabs tablet Commonly known as:  ONGLYZA Take 0.5 tablets (2.5 mg total) by mouth daily.      Follow-up Information    Newt Minion, MD. Schedule an appointment as soon as possible for a visit in 4 week(s).   Specialty:  Orthopedic Surgery Contact information: [place] [address] [phone]        [name] [name] T, PA-C Follow up on 04/11/2018.    Specialties:  Cardiology, Physician Assistant Why:  Please arrive 15 minutes early for your 8:45am appointment Contact information: [address] [place] [address] [phone]        MD at [facility]. Schedule an appointment as soon as possible for a visit in 5 day(s).   Why:  To be seen with repeat labs (CBC & BMP).       [name] Low, MD. Schedule an appointment as soon as possible for a visit.   Specialty:  Internal Medicine Why:  Upon discharge from ALF. Contact information: [address], [address] [place] [address] [phone]        Sueanne Margarita, MD .   Specialty:  Cardiology Contact information: [phone] [place] [address] [phone]          Allergies  Allergen Reactions  . Brilinta [Ticagrelor] Shortness Of Breath and Other (See Comments)    Changed to Plavix due to SOB  . Clindamycin/Lincomycin Anaphylaxis    Whole body skin peeling, blisters  . Doxycycline Anaphylaxis  . Penicillins Rash and Other (See Comments)    PATIENT HAS HAD A PCN REACTION WITH IMMEDIATE RASH, FACIAL/TONGUE/THROAT SWELLING, SOB, OR LIGHTHEADEDNESS WITH HYPOTENSION:  #  #  YES  #  #  Has patient had a PCN reaction causing severe rash involving mucus membranes or skin necrosis: No Has patient had a PCN reaction that required hospitalization: No Has patient had a PCN reaction occurring within the last 10 years: No  . Sulfa Antibiotics Other (See Comments)    Pt does not remember the reaction, but it sure the allergy exists. UNSPECIFIED REACTION       Procedures/Studies: Dg Chest 2 View  Result Date: 03/26/2018 CLINICAL DATA:  Preoperative chest x-ray EXAM: CHEST - 2 VIEW COMPARISON:  Feb 19, 2018 FINDINGS: Stable cardiomegaly. Mild atelectasis in the left base. The hila, mediastinum, lungs, and pleura are otherwise normal. A compression fracture of a lower thoracic vertebral body is stable. IMPRESSION: No acute  abnormality. Electronically Signed   By: Dorise Bullion III M.D   On: 03/26/2018 21:34   Ct Hip Left Wo Contrast  Result Date: 03/27/2018 CLINICAL DATA:  Left hip pain.  Multiple recent falls. EXAM: CT OF THE LEFT HIP WITHOUT CONTRAST TECHNIQUE: Multidetector CT imaging of the left hip was performed according to the standard protocol. Multiplanar CT image reconstructions were also generated. COMPARISON:  Radiographs dated 03/26/2018 and CT scan of abdomen and pelvis dated 01/22/2018  FINDINGS: Bones/Joint/Cartilage There is a nondisplaced fracture left superior pubic ramus, best seen on series 9. There is a slightly displaced fracture of the left inferior pubic ramus best seen on image 87 of series 6. The proximal left femur is intact. The linear lucencies in the intertrochanteric region on the radiographs are demonstrated to be benign vascular channels. 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 The visualized portion of the sacrum is intact. There is prominent fluid in the left greater trochanteric bursa with edema in the subcutaneous fat lateral to the left hip. There is moderate atrophy of the left gluteal muscles. IMPRESSION: 1. Fractures of the left inferior and superior pubic rami. 2. 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 3. No fracture of the proximal left femur. 4. Left greater trochanteric bursitis. Electronically Signed   By: Lorriane Shire M.D.   On: 03/27/2018 11:46   Dg Hip Unilat With Pelvis 2-3 Views Left  Result Date: 03/26/2018 CLINICAL DATA:  [place] 1 week ago, LEFT hip pain. EXAM: DG HIP (WITH OR WITHOUT PELVIS) 2-3V LEFT COMPARISON:  CT abdomen and pelvis April 8 1,019 FINDINGS: Linear lucency intertrochanteric femur. Femoral head is intact and located. Osteopenia without destructive bony lesion. Moderate vascular calcification. Surgical clips project in the pelvis. IMPRESSION: Nondisplaced LEFT femur intertrochanteric fracture versus  neurovascular channel. These results will be called to the ordering clinician or representative by the Radiologist Assistant, and communication documented in the PACS or zVision Dashboard. Electronically Signed   By: Elon Alas M.D.   On: 03/26/2018 14:40      Subjective: Patient frustrated this morning.  Reports that he has not been getting physical therapy in the hospital as he is supposed to.  Anxious to be discharged to rehab.  Denies dyspnea or chest pain.  No pain reported.  Indicates that his leg swelling have significantly improved.  As per RN, no acute issues reported.  Manual blood pressure checked this morning normal.  Discharge Exam:  Vitals:   03/28/18 1900 03/29/18 0432 03/29/18 0736 03/29/18 0737  BP: (!) 116/32 (!) 88/32 (!) 158/68 132/62  Pulse: 66 65    Resp:      Temp: 97.8 F (36.6 C) 98.4 F (36.9 C)    TempSrc: Oral Oral    SpO2: 97% 97%    Weight:      Height:        General exam: Does not elderly male, moderately built and overweight, lying comfortably propped up in bed. Respiratory system: Clear to auscultation. Respiratory effort normal. Cardiovascular system: S1 & S2 heard, RRR. No JVD, murmurs, rubs, gallops or clicks.  Trace bilateral leg edema.  Gastrointestinal system: Abdomen is nondistended, soft and nontender. No organomegaly or masses felt. Normal bowel sounds heard. Central nervous system: Alert and oriented. No focal neurological deficits. Extremities: Symmetric 5 x 5 power. Skin: No rashes, lesions or ulcers Psychiatry: Judgement and insight appear somewhat impaired.  Mood & affect appropriate.       The results of significant diagnostics from this hospitalization (including imaging, microbiology, ancillary and laboratory) are listed below for reference.     Microbiology: No results found for this or any previous visit (from the past 240 hour(s)).   Labs: CBC: Recent Labs  Lab 03/26/18 2101 03/27/18 0547 03/28/18 0701  WBC  8.2 6.8 6.9  NEUTROABS 5.8  --   --   HGB 9.2* 8.8* 9.1*  HCT 30.7* 28.5* 29.9*  MCV 96.8 95.6 96.8  PLT 223 212 035   Basic Metabolic Panel: Recent Labs  Lab 03/26/18 2101 03/27/18 0547 03/28/18 0701 03/29/18 0534  NA 137 140 139 137  K 4.4 4.1 4.5 4.5  CL 106 107 108 102  CO2 22 25 25 26   GLUCOSE 138* 135* 125* 134*  BUN 49* 48* 38* 33*  CREATININE 2.05* 1.86* 1.48* 1.36*  CALCIUM 8.3* 8.2* 8.2* 8.2*   Liver Function Tests: Recent Labs  Lab 03/27/18 0547  AST 26  ALT 20  ALKPHOS 114  BILITOT 0.5  PROT 5.4*  ALBUMIN 2.0*   BNP (last 3 results) Recent Labs    02/19/18 0347  BNP 488.1*   CBG: Recent Labs  Lab 03/28/18 2112 03/29/18 0037 03/29/18 0435 03/29/18 0744 03/29/18 1219  GLUCAP 153* 134* 119* 125* 151*   Hgb A1c Recent Labs    03/27/18 0547  HGBA1C 6.7*   Lipid Profile Recent Labs    03/27/18 0547  CHOL 112  HDL 36*  LDLCALC 52  TRIG 119  CHOLHDL 3.1   Urinalysis    Component Value Date/Time   COLORURINE YELLOW 03/27/2018 1333   APPEARANCEUR CLOUDY (A) 03/27/2018 1333   LABSPEC 1.014 03/27/2018 1333   PHURINE 6.0 03/27/2018 1333   GLUCOSEU NEGATIVE 03/27/2018 1333   HGBUR SMALL (A) 03/27/2018 1333   BILIRUBINUR NEGATIVE 03/27/2018 1333   KETONESUR NEGATIVE 03/27/2018 1333   PROTEINUR NEGATIVE 03/27/2018 1333   UROBILINOGEN 0.2 10/22/2014 1052   NITRITE NEGATIVE 03/27/2018 1333   LEUKOCYTESUR LARGE (A) 03/27/2018 1333      Time coordinating discharge: 40 minutes  SIGNED:  Vernell Leep, MD, FACP, [facility]. [facility] Pager [phone]  If 7PM-7AM, please contact night-coverage [email or web address] Password [facility] 03/29/2018, 2:47 PM

## 2018-03-29 NOTE — Social Work (Signed)
Clinical Social Worker facilitated patient discharge including contacting patient family and facility to confirm patient discharge plans.  Clinical information faxed to facility and family agreeable with plan.    CSW arranged ambulance transport via [facility] to [facility].    RN to call [phone] to give report prior to discharge.  Clinical Social Worker will sign off for now as social work intervention is no longer needed. Please consult [place] again if new need arises.  Elissa Hefty, LCSW Clinical Social Worker [phone]

## 2018-03-29 NOTE — NC FL2 (Signed)
[place] LEVEL OF CARE SCREENING TOOL     IDENTIFICATION  Patient Name: [name] [name] Birthdate: 1933/06/02 Sex: male Admission Date (Current Location): 03/26/2018  [facility] and [place] Number:  Herbalist and Address:  [facility], [place] [address] [place], [place] [address]      Provider Number: [id number]  Attending Physician Name and Address:  Modena Jansky, MD  Relative Name and Phone Number:  Melrose Nakayama, sister, [phone]    Current Level of Care: Hospital Recommended Level of Care: [place] Prior Approval Number:    Date Approved/Denied:   PASRR Number: [id number]  Discharge Plan: Other (Comment)(Assisted Living)    Current Diagnoses: Patient Active Problem List   Diagnosis Date Noted  . Pelvic fracture ([place]) 03/28/2018  . Severe protein-calorie malnutrition ([place])   . Diabetic polyneuropathy associated with type 2 diabetes mellitus ([place])   . DCM (dilated cardiomyopathy) ([place]) 02/27/2018  . Pressure injury of skin 02/20/2018  . Hyperkalemia 02/19/2018  . Elevated troponin 02/19/2018  . GERD (gastroesophageal reflux disease) 02/19/2018  . Type II diabetes mellitus with renal manifestations ([place]) 02/19/2018  . HTN (hypertension) 02/19/2018  . AKI (acute kidney injury) ([place])   . Anaphylactic syndrome   . Drug-induced skin rash   . Acute metabolic encephalopathy 25/02/3975  . Allergic reaction caused by a drug 12/05/2017  . Acute renal failure superimposed on stage 3 chronic kidney disease ([place]) 12/05/2017  . Metabolic acidosis 73/41/9379  . Hardware complicating wound infection ([place]) 11/30/2017  . Trimalleolar fracture of ankle, closed, left, sequela   . Fall 10/24/2017  . Malleolar fracture, left, closed, initial encounter 10/24/2017  . Fibula fracture 10/24/2017  . Diabetes mellitus with diabetic nephropathy without long-term current use of insulin ([place]) 01/05/2017  . CKD  (chronic kidney disease), stage III ([place])   . Paroxysmal atrial fibrillation ([name])   . Bleeding gastrointestinal   . Dyslipidemia 10/14/2014  . CAD in native artery- total LAD, s/p RI DES 06/19/14 07/11/2014  . Ulcerative colitis ([place])     Orientation RESPIRATION BLADDER Height & Weight     Self, Time, Situation, Place  Normal Continent Weight: 233 lb 7.5 oz (105.9 kg) Height:  5' 10"  (177.8 cm)  BEHAVIORAL SYMPTOMS/MOOD NEUROLOGICAL BOWEL NUTRITION STATUS      Continent Diet(See DC Summary)  AMBULATORY STATUS COMMUNICATION OF NEEDS Skin   Limited Assist(min to mod assist for mobility) Verbally Surgical wounds, PU Stage and Appropriate Care PU Stage 1 Dressing: (PRN  Foam Dressing)                     Personal Care Assistance Level of Assistance  Bathing, Dressing, Feeding Bathing Assistance: Maximum assistance Feeding assistance: Limited assistance Dressing Assistance: Maximum assistance     Functional Limitations Info  Sight, Hearing, Speech Sight Info: Adequate Hearing Info: Adequate Speech Info: Adequate    SPECIAL CARE FACTORS FREQUENCY  PT (By licensed PT), OT (By licensed OT)     PT Frequency: 5x week OT Frequency: 5x week            Contractures      Additional Factors Info  Code Status, Allergies, Insulin Sliding Scale Code Status Info: DNR Allergies Info: BRILINTA TICAGRELOR, CLINDAMYCIN/LINCOMYCIN, DOXYCYCLINE, PENICILLINS, SULFA ANTIBIOTICS    Insulin Sliding Scale Info: Insulin daily       Current Medications (03/29/2018):  This is the current hospital active medication list Current Facility-Administered Medications  Medication Dose Route  Frequency Provider Last Rate Last Dose  . 0.9 %  sodium chloride infusion  250 mL Intravenous PRN Jani Gravel, MD      . acetaminophen (TYLENOL) tablet 650 mg  650 mg Oral Q6H PRN Jani Gravel, MD   650 mg at 03/29/18 6384   Or  . acetaminophen (TYLENOL) suppository 650 mg  650 mg Rectal Q6H PRN Jani Gravel, MD       . amiodarone (PACERONE) tablet 200 mg  200 mg Oral Daily Jani Gravel, MD   200 mg at 03/29/18 0853  . apixaban (ELIQUIS) tablet 2.5 mg  2.5 mg Oral BID Hammons, Kimberly B, RPH   2.5 mg at 03/29/18 0853  . aspirin EC tablet 81 mg  81 mg Oral Daily Regalado, Belkys A, MD   81 mg at 03/29/18 0853  . atorvastatin (LIPITOR) tablet 40 mg  40 mg Oral q1800 Clegg, Amy D, NP   40 mg at 03/28/18 1638  . docusate sodium (COLACE) capsule 100 mg  100 mg Oral BID Jani Gravel, MD   100 mg at 03/29/18 0854  . feeding supplement (PRO-STAT SUGAR FREE 64) liquid 30 mL  30 mL Oral Daily Jani Gravel, MD   30 mL at 03/29/18 0854  . furosemide (LASIX) tablet 40 mg  40 mg Oral Daily Clegg, Amy D, NP   40 mg at 03/29/18 0853  . HYDROmorphone (DILAUDID) injection 0.5 mg  0.5 mg Intravenous Q4H PRN Jani Gravel, MD      . insulin aspart (novoLOG) injection 0-5 Units  0-5 Units Subcutaneous QHS Jani Gravel, MD      . insulin aspart (novoLOG) injection 0-9 Units  0-9 Units Subcutaneous TID WC Jani Gravel, MD   1 Units at 03/29/18 0753  . linagliptin (TRADJENTA) tablet 5 mg  5 mg Oral Daily Jani Gravel, MD   5 mg at 03/29/18 0852  . multivitamin with minerals tablet 1 tablet  1 tablet Oral Daily Jani Gravel, MD   1 tablet at 03/29/18 0854  . pantoprazole (PROTONIX) EC tablet 40 mg  40 mg Oral Daily Jani Gravel, MD   40 mg at 03/29/18 0853  . sodium chloride flush (NS) 0.9 % injection 3 mL  3 mL Intravenous Q12H Jani Gravel, MD   3 mL at 03/29/18 0854  . sodium chloride flush (NS) 0.9 % injection 3 mL  3 mL Intravenous PRN Jani Gravel, MD         Discharge Medications: Please see discharge summary for a list of discharge medications.  Relevant Imaging Results:  Relevant Lab Results:   Additional Information SS#: [id number]  Normajean Baxter, LCSW

## 2018-03-29 NOTE — NC FL2 (Signed)
[place] LEVEL OF CARE SCREENING TOOL     IDENTIFICATION  Patient Name: [name] [name] Birthdate: May 21, 1933 Sex: male Admission Date (Current Location): 03/26/2018  [facility] and [place] Number:  Herbalist and Address:  The . [facility] [place] [address] [place], [place] [address]      Provider Number: [id number]  Attending Physician Name and Address:  Modena Jansky, MD  Relative Name and Phone Number:  Melrose Nakayama, sister, [phone]    Current Level of Care: Hospital Recommended Level of Care: [place] Prior Approval Number:    Date Approved/Denied:   PASRR Number: [id number]  Discharge Plan: Other (Comment)(Assisted Living)    Current Diagnoses: Patient Active Problem List   Diagnosis Date Noted  . Pelvic fracture ([place]) 03/28/2018  . Severe protein-calorie malnutrition ([place])   . Diabetic polyneuropathy associated with type 2 diabetes mellitus ([place])   . DCM (dilated cardiomyopathy) ([place]) 02/27/2018  . Pressure injury of skin 02/20/2018  . Hyperkalemia 02/19/2018  . Elevated troponin 02/19/2018  . GERD (gastroesophageal reflux disease) 02/19/2018  . Type II diabetes mellitus with renal manifestations ([place]) 02/19/2018  . HTN (hypertension) 02/19/2018  . AKI (acute kidney injury) ([place])   . Anaphylactic syndrome   . Drug-induced skin rash   . Acute metabolic encephalopathy 68/09/7516  . Allergic reaction caused by a drug 12/05/2017  . Acute renal failure superimposed on stage 3 chronic kidney disease ([place]) 12/05/2017  . Metabolic acidosis 00/17/4944  . Hardware complicating wound infection ([place]) 11/30/2017  . Trimalleolar fracture of ankle, closed, left, sequela   . Fall 10/24/2017  . Malleolar fracture, left, closed, initial encounter 10/24/2017  . Fibula fracture 10/24/2017  . Diabetes mellitus with diabetic nephropathy without long-term current use of insulin ([place]) 01/05/2017  . CKD  (chronic kidney disease), stage III ([place])   . Paroxysmal atrial fibrillation ([name])   . Bleeding gastrointestinal   . Dyslipidemia 10/14/2014  . CAD in native artery- total LAD, s/p RI DES 06/19/14 07/11/2014  . Ulcerative colitis ([place])     Orientation RESPIRATION BLADDER Height & Weight     Self, Time, Situation, Place  Normal Continent Weight: 233 lb 7.5 oz (105.9 kg) Height:  5' 10"  (177.8 cm)  BEHAVIORAL SYMPTOMS/MOOD NEUROLOGICAL BOWEL NUTRITION STATUS      Continent Diet(See DC Summary)  AMBULATORY STATUS COMMUNICATION OF NEEDS Skin   Limited Assist(min to mod assist for mobility) Verbally Surgical wounds, PU Stage and Appropriate Care PU Stage 1 Dressing: (PRN  Foam Dressing)                     Personal Care Assistance Level of Assistance  Bathing, Dressing, Feeding Bathing Assistance: Maximum assistance Feeding assistance: Limited assistance Dressing Assistance: Maximum assistance     Functional Limitations Info  Sight, Hearing, Speech Sight Info: Adequate Hearing Info: Adequate Speech Info: Adequate    SPECIAL CARE FACTORS FREQUENCY  PT (By licensed PT), OT (By licensed OT)     PT Frequency: 5x week OT Frequency: 5x week            Contractures      Additional Factors Info  Code Status, Allergies, Insulin Sliding Scale Code Status Info: DNR Allergies Info: BRILINTA TICAGRELOR, CLINDAMYCIN/LINCOMYCIN, DOXYCYCLINE, PENICILLINS, SULFA ANTIBIOTICS    Insulin Sliding Scale Info: Insulin daily       Current Medications (03/29/2018):  This is the current hospital active medication list Current Facility-Administered Medications  Medication Dose Route  Frequency Provider Last Rate Last Dose  . 0.9 %  sodium chloride infusion  250 mL Intravenous PRN Jani Gravel, MD      . acetaminophen (TYLENOL) tablet 650 mg  650 mg Oral Q6H PRN Jani Gravel, MD   650 mg at 03/29/18 0174   Or  . acetaminophen (TYLENOL) suppository 650 mg  650 mg Rectal Q6H PRN Jani Gravel, MD       . amiodarone (PACERONE) tablet 200 mg  200 mg Oral Daily Jani Gravel, MD   200 mg at 03/29/18 0853  . apixaban (ELIQUIS) tablet 2.5 mg  2.5 mg Oral BID Hammons, Kimberly B, RPH   2.5 mg at 03/29/18 0853  . aspirin EC tablet 81 mg  81 mg Oral Daily Regalado, Belkys A, MD   81 mg at 03/29/18 0853  . atorvastatin (LIPITOR) tablet 40 mg  40 mg Oral q1800 Clegg, Amy D, NP   40 mg at 03/28/18 1638  . docusate sodium (COLACE) capsule 100 mg  100 mg Oral BID Jani Gravel, MD   100 mg at 03/29/18 0854  . feeding supplement (PRO-STAT SUGAR FREE 64) liquid 30 mL  30 mL Oral Daily Jani Gravel, MD   30 mL at 03/29/18 0854  . furosemide (LASIX) tablet 40 mg  40 mg Oral Daily Clegg, Amy D, NP   40 mg at 03/29/18 0853  . HYDROmorphone (DILAUDID) injection 0.5 mg  0.5 mg Intravenous Q4H PRN Jani Gravel, MD      . insulin aspart (novoLOG) injection 0-5 Units  0-5 Units Subcutaneous QHS Jani Gravel, MD      . insulin aspart (novoLOG) injection 0-9 Units  0-9 Units Subcutaneous TID WC Jani Gravel, MD   2 Units at 03/29/18 1233  . linagliptin (TRADJENTA) tablet 5 mg  5 mg Oral Daily Jani Gravel, MD   5 mg at 03/29/18 0852  . multivitamin with minerals tablet 1 tablet  1 tablet Oral Daily Jani Gravel, MD   1 tablet at 03/29/18 0854  . pantoprazole (PROTONIX) EC tablet 40 mg  40 mg Oral Daily Jani Gravel, MD   40 mg at 03/29/18 0853  . sodium chloride flush (NS) 0.9 % injection 3 mL  3 mL Intravenous Q12H Jani Gravel, MD   3 mL at 03/29/18 0854  . sodium chloride flush (NS) 0.9 % injection 3 mL  3 mL Intravenous PRN Jani Gravel, MD         Discharge Medications: Please see discharge summary for a list of discharge medications.  Relevant Imaging Results:  Relevant Lab Results:   Additional Information SS#: [id number]  Normajean Baxter, LCSW

## 2018-03-29 NOTE — Care Management Important Message (Signed)
Important Message  Patient Details  Name: [name] [name] MRN: [id number] Date of Birth: 1933-10-08   Medicare Important Message Given:  Yes    Orbie Pyo 03/29/2018, 2:57 PM

## 2018-03-29 NOTE — Discharge Instructions (Signed)
Please get your medications reviewed and adjusted by your Primary MD.  Please request your Primary MD to go over all Hospital Tests and Procedure/Radiological results at the follow up, please get all Hospital records sent to your Prim MD by signing hospital release before you go home.  If you had Pneumonia of Lung problems at the Hospital: Please get a 2 view Chest X ray done in 6-8 weeks after hospital discharge or sooner if instructed by your Primary MD.  If you have Congestive Heart Failure: Please call your Cardiologist or Primary MD anytime you have any of the following symptoms:  1) 3 pound weight gain in 24 hours or 5 pounds in 1 week  2) shortness of breath, with or without a dry hacking cough  3) swelling in the hands, feet or stomach  4) if you have to sleep on extra pillows at night in order to breathe  Follow cardiac low salt diet and 1.5 lit/day fluid restriction.  If you have diabetes Accuchecks 4 times/day, Once in AM empty stomach and then before each meal. Log in all results and show them to your primary doctor at your next visit. If any glucose reading is under 80 or above 300 call your primary MD immediately.  If you have Seizure/Convulsions/Epilepsy: Please do not drive, operate heavy machinery, participate in activities at heights or participate in high speed sports until you have seen by Primary MD or a Neurologist and advised to do so again.  If you had Gastrointestinal Bleeding: Please ask your Primary MD to check a complete blood count within one week of discharge or at your next visit. Your endoscopic/colonoscopic biopsies that are pending at the time of discharge, will also need to followed by your Primary MD.  Get Medicines reviewed and adjusted. Please take all your medications with you for your next visit with your Primary MD  Please request your Primary MD to go over all hospital tests and procedure/radiological results at the follow up, please ask your  Primary MD to get all Hospital records sent to his/her office.  If you experience worsening of your admission symptoms, develop shortness of breath, life threatening emergency, suicidal or homicidal thoughts you must seek medical attention immediately by calling 911 or calling your MD immediately  if symptoms less severe.  You must read complete instructions/literature along with all the possible adverse reactions/side effects for all the Medicines you take and that have been prescribed to you. Take any new Medicines after you have completely understood and accpet all the possible adverse reactions/side effects.   Do not drive or operate heavy machinery when taking Pain medications.   Do not take more than prescribed Pain, Sleep and Anxiety Medications  Special Instructions: If you have smoked or chewed Tobacco  in the last 2 yrs please stop smoking, stop any regular Alcohol  and or any Recreational drug use.  Wear Seat belts while driving.  Please note You were cared for by a hospitalist during your hospital stay. If you have any questions about your discharge medications or the care you received while you were in the hospital after you are discharged, you can call the unit and asked to speak with the hospitalist on call if the hospitalist that took care of you is not available. Once you are discharged, your primary care physician will handle any further medical issues. Please note that NO REFILLS for any discharge medications will be authorized once you are discharged, as it is imperative that you  return to your primary care physician (or establish a relationship with a primary care physician if you do not have one) for your aftercare needs so that they can reassess your need for medications and monitor your lab values.  You can reach the hospitalist office at phone [phone] or fax [phone]   If you do not have a primary care physician, you can call [phone] for a physician  referral.  Simple Pelvic Fracture, Adult A pelvic fracture is a break in one of the pelvic bones. The pelvic bones include the bones that you sit on and the bones that make up the lower part of your spine. A pelvic fracture is called simple if the broken bones are stable and are not moving out of place. What are the causes? Common causes of this type of fracture include:  A fall.  A car accident.  Force or pressure applied to the pelvis.  What increases the risk? You may be at higher risk for this type of fracture if:  You play high-impact sports.  You are an older person with a condition that causes weak bones (osteoporosis).  You have a bone-weakening disease.  What are the signs or symptoms? Signs and symptoms may include:  Tenderness, swelling, or bruising in the affected area.  Pain when moving the hip.  Pain when walking or standing.  How is this diagnosed? A diagnosis is made with a physical exam and X-rays. Sometimes, a CT scan is also done. How is this treated? The goal of treatment is to get the bones to heal in a good position. Treatment of a simple pelvic fracture usually involves staying in bed (bed rest) and using crutches or a walker until the bones heal. Medicines may be prescribed for pain. Medicines may also be prescribed that help to prevent blood clots from forming in the legs. Follow these instructions at home: Managing pain, stiffness, and swelling  If directed, apply ice to the injured area: ? Put ice in a plastic bag. ? Place a towel between your skin and the bag. ? Leave the ice on for 20 minutes, 2-3 times a day.  Raise the injured area above the level of your heart while you are sitting or lying down. Driving  Do not  drive or operate heavy machinery until your health care provider tells you it is safe to do. Activity  Stay on bed rest for as long as directed by your health care provider.  While on bed rest: ? Change the position of your  legs every 1-2 hours. This keeps blood moving well through both of your legs. ? You may sit for as long as you feel comfortable.  After bed rest: ? Avoid strenuous activities for as long as directed by your health care provider. ? Return to your normal activities as directed by your health care provider. Ask your health care provider what activities are safe for you. Safety  Do not use the injured limb to support your body weight until your health care provider says that you can. Use crutches or a walker as directed by your health care provider. General instructions  Do not use any tobacco products, including cigarettes, chewing tobacco, or electronic cigarettes. Tobacco can delay bone healing. If you need help quitting, ask your health care provider.  Take medicines only as directed by your health care provider.  Keep all follow-up visits as directed by your health care provider. This is important. Contact a health care provider if:  Your  pain gets worse.  Your pain is not relieved with medicines. Get help right away if:  You feel light-headed or faint.  You develop chest pain.  You develop shortness of breath.  You have a fever.  You have blood in your urine or your stools.  You have vaginal bleeding.  You have difficulty or pain with urination or with a bowel movement.  You have difficulty or increased pain with walking.  You have new or increased swelling in one of your legs.  You have numbness in your legs or groin area. This information is not intended to replace advice given to you by your health care provider. Make sure you discuss any questions you have with your health care provider. Document Released: 12/12/2001 Document Revised: 05/29/2016 Document Reviewed: 05/27/2014 Elsevier Interactive Patient Education  Trentan Schein.

## 2018-03-29 NOTE — Progress Notes (Signed)
Physical Therapy Treatment Patient Details Name: KAMONI GENTLES MRN: [id number] DOB: 12/09/1932 Today's Date: 03/29/2018    History of Present Illness pt is and 84 y/o male with HTN, DM2, CAD with stenting, CHF, p AFIB, compression fx L1, [facility] after a fall approx 1 week prior to admission.  Initially xray showed L trochanteric fx, but CT on admission showed no hip fx, but did show sup/in pubic rami fx's on the left.    PT Comments    Pt performed gait training and functional mobility and he currently is requiring min to min guard assistance at this time.  Pt required cues for safety during functional mobility.  Pt presents with weakness and balance deficits and continues to require PT post d/c from the hospital.  If ALF can provide necessary assistance then patient can return to ALF if not then he will require short term SNF stay.  Re-educated patient on frequency in hospital setting of 3x/week as he expressed he was "upset" he did not receive treatment yesterday.  Encouraged patient to mobilize with nursing staff on days when therapy is not present to reduce risks of immobility.  Will plan for follow up on mobility per 3x/week POC.      Follow Up Recommendations  SNF;Follow surgeon's recommendation for DC plan and follow-up therapies(SNF vs assisted living ( if alf can provide necessary assistance))     Equipment Recommendations  Other (comment)(TBA)    Recommendations for Other Services       Precautions / Restrictions Precautions Precautions: Fall Restrictions Weight Bearing Restrictions: Yes RLE Weight Bearing: Weight bearing as tolerated LLE Weight Bearing: Weight bearing as tolerated    Mobility  Bed Mobility Overal bed mobility: Needs Assistance Bed Mobility: Supine to Sit     Supine to sit: Supervision     General bed mobility comments: Pt required increased time and endurance but able to achieve sitting edge of bed unassisted.     Transfers Overall transfer level: Needs assistance Equipment used: Rolling walker (2 wheeled) Transfers: Sit to/from Stand Sit to Stand: Min assist         General transfer comment: Cues for hand placement, assistance to boost into standing and Bed elevated to improve ease into standing.    Ambulation/Gait Ambulation/Gait assistance: Min guard;+2 safety/equipment(for close chair follow as patient fatigues easily.  ) Ambulation Distance (Feet): 30 Feet Assistive device: Rolling walker (2 wheeled) Gait Pattern/deviations: Step-through pattern;Antalgic;Trunk flexed;Decreased step length - right;Decreased step length - left;Decreased stride length     General Gait Details: Cues for sequencing, upper trunk control and increasing B stride length.  Pt with poor endurance and required sitting in chair after 30 ft of gait training.     Stairs             Wheelchair Mobility    Modified Rankin (Stroke Patients Only)       Balance Overall balance assessment: Needs assistance   Sitting balance-[name]: Fair       Standing balance-[name]: Poor                              Cognition Arousal/Alertness: Awake/alert Behavior During Therapy: WFL for tasks assessed/performed Overall Cognitive Status: Within Functional Limits for tasks assessed  Exercises Total Joint Exercises Ankle Circles/Pumps: AROM;Both;10 reps;Supine Quad Sets: AROM;Both;10 reps;Supine Hip ABduction/ADduction: AROM;10 reps;Seated;Both Long Arc Quad: AROM;Both;10 reps;Seated Marching in Standing: AROM;10 reps;Seated;Both    General Comments        Pertinent Vitals/Pain Pain Assessment: 0-10 Pain Score: 5  Pain Location: L hip/groin Pain Descriptors / Indicators: Grimacing Pain Intervention(s): Monitored during session;Repositioned;Premedicated before session(reports he received tylenol this am.  )    Home Living                       Prior Function            PT Goals (current goals can now be found in the care plan section) Acute Rehab PT Goals Patient Stated Goal: Finish up with therapy and be able to walk Potential to Achieve Goals: Fair Progress towards PT goals: Progressing toward goals    Frequency    Min 3X/week      PT Plan Current plan remains appropriate    Co-evaluation              AM-PAC PT "6 Clicks" Daily Activity  Outcome Measure  Difficulty turning over in bed (including adjusting bedclothes, sheets and blankets)?: A Lot Difficulty moving from lying on back to sitting on the side of the bed? : A Lot Difficulty sitting down on and standing up from a chair with arms (e.g., wheelchair, bedside commode, etc,.)?: Unable Help needed moving to and from a bed to chair (including a wheelchair)?: A Little Help needed walking in hospital room?: A Little Help needed climbing 3-5 steps with a railing? : A Lot 6 Click Score: 13    End of Session Equipment Utilized During Treatment: Gait belt Activity Tolerance: Patient tolerated treatment well;Patient limited by pain Patient left: with call bell/phone within reach;in chair;with chair alarm set Nurse Communication: Mobility status PT Visit Diagnosis: Other abnormalities of gait and mobility (R26.89);Muscle weakness (generalized) (M62.81);Pain Pain - part of body: (left hip and groin)     Time: 1610-9604 PT Time Calculation (min) (ACUTE ONLY): 23 min  Charges:  $Gait Training: 8-22 mins $Therapeutic Exercise: 8-22 mins                    G Codes:       Governor Rooks, PTA pager [phone]    Cristela Blue 03/29/2018, 9:55 AM

## 2018-03-29 NOTE — Clinical Social Work Placement (Signed)
   CLINICAL SOCIAL WORK PLACEMENT  NOTE  Date:  03/29/2018  Patient Details  Name: [name] [name] MRN: [id number] Date of Birth: 08/04/1933  Clinical Social Work is seeking post-discharge placement for this patient at the [place] level of care (*CSW will initial, date and re-position this form in  chart as items are completed):  Yes   Patient/family provided with [facility] Work Department's list of facilities offering this level of care within the geographic area requested by the patient (or if unable, by the patient's family).  Yes   Patient/family informed of their freedom to choose among providers that offer the needed level of care, that participate in Medicare, Medicaid or managed care program needed by the patient, have an available bed and are willing to accept the patient.  Yes   Patient/family informed of 's ownership interest in [facility] and [facility], as well as of the fact that they are under no obligation to receive care at these facilities.  PASRR submitted to EDS on       PASRR number received on       Existing PASRR number confirmed on       FL2 transmitted to all facilities in geographic area requested by pt/family on       FL2 transmitted to all facilities within larger geographic area on       Patient informed that his/her managed care company has contracts with or will negotiate with certain facilities, including the following:        Yes   Patient/family informed of bed offers received.  Patient chooses bed at [facility])     Physician recommends and patient chooses bed at      Patient to be transferred to [facility]) on 03/29/18.  Patient to be transferred to facility by [facility]     Patient family notified on 03/29/18 of transfer.  Name of family member notified:  pt responsible      PHYSICIAN       Additional Comment:     _______________________________________________ Normajean Baxter, LCSW 03/29/2018, 3:14 PM

## 2018-03-31 DIAGNOSIS — I5043 Acute on chronic combined systolic (congestive) and diastolic (congestive) heart failure: Secondary | ICD-10-CM | POA: Diagnosis not present

## 2018-03-31 DIAGNOSIS — I130 Hypertensive heart and chronic kidney disease with heart failure and stage 1 through stage 4 chronic kidney disease, or unspecified chronic kidney disease: Secondary | ICD-10-CM | POA: Diagnosis not present

## 2018-03-31 DIAGNOSIS — S82852D Displaced trimalleolar fracture of left lower leg, subsequent encounter for closed fracture with routine healing: Secondary | ICD-10-CM | POA: Diagnosis not present

## 2018-03-31 DIAGNOSIS — E1122 Type 2 diabetes mellitus with diabetic chronic kidney disease: Secondary | ICD-10-CM | POA: Diagnosis not present

## 2018-03-31 DIAGNOSIS — I2510 Atherosclerotic heart disease of native coronary artery without angina pectoris: Secondary | ICD-10-CM | POA: Diagnosis not present

## 2018-03-31 DIAGNOSIS — E1142 Type 2 diabetes mellitus with diabetic polyneuropathy: Secondary | ICD-10-CM | POA: Diagnosis not present

## 2018-04-02 ENCOUNTER — Telehealth: Payer: Self-pay

## 2018-04-02 NOTE — Telephone Encounter (Signed)
**Note De-Identified  Obfuscation** The pt was released to assisted living facility so a TCM call is not required. See  following Social Work note from 03/29/18:  Care giving concerns: Pt from [facility] and will need rehab and can return to assisted living.

## 2018-04-02 NOTE — Telephone Encounter (Signed)
-----   Message from Rivka Barbara sent at 03/29/2018 11:48 AM EDT ----- Regarding: TOC  TOC appointment [name] 8:45 6/26

## 2018-04-03 DIAGNOSIS — E1122 Type 2 diabetes mellitus with diabetic chronic kidney disease: Secondary | ICD-10-CM | POA: Diagnosis not present

## 2018-04-03 DIAGNOSIS — I130 Hypertensive heart and chronic kidney disease with heart failure and stage 1 through stage 4 chronic kidney disease, or unspecified chronic kidney disease: Secondary | ICD-10-CM | POA: Diagnosis not present

## 2018-04-03 DIAGNOSIS — S82852D Displaced trimalleolar fracture of left lower leg, subsequent encounter for closed fracture with routine healing: Secondary | ICD-10-CM | POA: Diagnosis not present

## 2018-04-03 DIAGNOSIS — I5043 Acute on chronic combined systolic (congestive) and diastolic (congestive) heart failure: Secondary | ICD-10-CM | POA: Diagnosis not present

## 2018-04-03 DIAGNOSIS — I2510 Atherosclerotic heart disease of native coronary artery without angina pectoris: Secondary | ICD-10-CM | POA: Diagnosis not present

## 2018-04-03 DIAGNOSIS — E1142 Type 2 diabetes mellitus with diabetic polyneuropathy: Secondary | ICD-10-CM | POA: Diagnosis not present

## 2018-04-04 DIAGNOSIS — I130 Hypertensive heart and chronic kidney disease with heart failure and stage 1 through stage 4 chronic kidney disease, or unspecified chronic kidney disease: Secondary | ICD-10-CM | POA: Diagnosis not present

## 2018-04-04 DIAGNOSIS — E1122 Type 2 diabetes mellitus with diabetic chronic kidney disease: Secondary | ICD-10-CM | POA: Diagnosis not present

## 2018-04-04 DIAGNOSIS — S82852D Displaced trimalleolar fracture of left lower leg, subsequent encounter for closed fracture with routine healing: Secondary | ICD-10-CM | POA: Diagnosis not present

## 2018-04-04 DIAGNOSIS — I2510 Atherosclerotic heart disease of native coronary artery without angina pectoris: Secondary | ICD-10-CM | POA: Diagnosis not present

## 2018-04-04 DIAGNOSIS — I5043 Acute on chronic combined systolic (congestive) and diastolic (congestive) heart failure: Secondary | ICD-10-CM | POA: Diagnosis not present

## 2018-04-04 DIAGNOSIS — E1142 Type 2 diabetes mellitus with diabetic polyneuropathy: Secondary | ICD-10-CM | POA: Diagnosis not present

## 2018-04-05 DIAGNOSIS — I130 Hypertensive heart and chronic kidney disease with heart failure and stage 1 through stage 4 chronic kidney disease, or unspecified chronic kidney disease: Secondary | ICD-10-CM | POA: Diagnosis not present

## 2018-04-05 DIAGNOSIS — E1142 Type 2 diabetes mellitus with diabetic polyneuropathy: Secondary | ICD-10-CM | POA: Diagnosis not present

## 2018-04-05 DIAGNOSIS — I2510 Atherosclerotic heart disease of native coronary artery without angina pectoris: Secondary | ICD-10-CM | POA: Diagnosis not present

## 2018-04-05 DIAGNOSIS — L0390 Cellulitis, unspecified: Secondary | ICD-10-CM | POA: Diagnosis not present

## 2018-04-05 DIAGNOSIS — I509 Heart failure, unspecified: Secondary | ICD-10-CM | POA: Diagnosis not present

## 2018-04-05 DIAGNOSIS — S82852D Displaced trimalleolar fracture of left lower leg, subsequent encounter for closed fracture with routine healing: Secondary | ICD-10-CM | POA: Diagnosis not present

## 2018-04-05 DIAGNOSIS — R531 Weakness: Secondary | ICD-10-CM | POA: Diagnosis not present

## 2018-04-05 DIAGNOSIS — S329XXA Fracture of unspecified parts of lumbosacral spine and pelvis, initial encounter for closed fracture: Secondary | ICD-10-CM | POA: Diagnosis not present

## 2018-04-05 DIAGNOSIS — I5043 Acute on chronic combined systolic (congestive) and diastolic (congestive) heart failure: Secondary | ICD-10-CM | POA: Diagnosis not present

## 2018-04-05 DIAGNOSIS — E1122 Type 2 diabetes mellitus with diabetic chronic kidney disease: Secondary | ICD-10-CM | POA: Diagnosis not present

## 2018-04-09 DIAGNOSIS — E1142 Type 2 diabetes mellitus with diabetic polyneuropathy: Secondary | ICD-10-CM | POA: Diagnosis not present

## 2018-04-09 DIAGNOSIS — S82852D Displaced trimalleolar fracture of left lower leg, subsequent encounter for closed fracture with routine healing: Secondary | ICD-10-CM | POA: Diagnosis not present

## 2018-04-09 DIAGNOSIS — I5043 Acute on chronic combined systolic (congestive) and diastolic (congestive) heart failure: Secondary | ICD-10-CM | POA: Diagnosis not present

## 2018-04-09 DIAGNOSIS — E1122 Type 2 diabetes mellitus with diabetic chronic kidney disease: Secondary | ICD-10-CM | POA: Diagnosis not present

## 2018-04-09 DIAGNOSIS — I130 Hypertensive heart and chronic kidney disease with heart failure and stage 1 through stage 4 chronic kidney disease, or unspecified chronic kidney disease: Secondary | ICD-10-CM | POA: Diagnosis not present

## 2018-04-09 DIAGNOSIS — I2510 Atherosclerotic heart disease of native coronary artery without angina pectoris: Secondary | ICD-10-CM | POA: Diagnosis not present

## 2018-04-10 DIAGNOSIS — F321 Major depressive disorder, single episode, moderate: Secondary | ICD-10-CM | POA: Diagnosis not present

## 2018-04-10 DIAGNOSIS — I5042 Chronic combined systolic (congestive) and diastolic (congestive) heart failure: Secondary | ICD-10-CM | POA: Insufficient documentation

## 2018-04-10 NOTE — Progress Notes (Signed)
Cardiology Office Note:    Date:  04/11/2018   ID:  [name] [name], DOB 11/30/1932, MRN [id number]  PCP:  [name] Low, MD  Cardiologist:  [name] Him, MD   Referring MD: [name] Low, MD   Chief Complaint  Patient presents with  . Hospitalization Follow-up    CHF    History of Present Illness:    [name] [name] is a 83 y.o. male with coronary artery disease s/p prior PCI with DES to the RI and known chronic total occlusion of the LAD, paroxysmal atrial fibrillation, diastolic CHF, atrial flutter s/p ablation, chronic kidney disease, diabetes, hypertension, Ulcerative Colitis s/p colectomy.  He has not been on anticoagulation due to a history of GI bleeding.  He was admitted in 02/2018 with bradycardia in the setting of profound hyperkalemia.  His HR recovered with reversal of the hyperkalemia but he developed AFib with RVR while his Amiodarone and beta-blocker were on hold.  He was placed on Apixaban and underwent TEE guided DCCV.  His EF was 35-40%.  He was last seen by Dr. Fransico [name] 02/27/18.  Follow up echo demonstrated EF 40-45 and PASP 44.    He was admitted 6/10-6/13 with a pelvic fracture after a fall.  He was seen by the CHF team during his hospitalization.  His cardiomyopathy was felt to be related to tachycardia.  It was felt that he did not need long term follow up with the CHF team.   [name] [name] returns for post hospital follow up.  He is here with his sister.  He is currently staying at [facility]. His sister is not pleased with his care at that facility.  He denies chest pain, syncope, paroxysmal nocturnal dyspnea, cough. He has not seen any melena or hematochezia. He is chronically short of breath without change.  He denies palpitations.   Prior CV studies:   The following studies were reviewed today:  Echo 03/14/18 Mild conc LVH, EF 40-45, diff HK, Gr 1 DD, trivial AI, severe LAE, mod RAE, normal RVSF, mild TR, PASP 44, GLS -12.5% (abnormal)  TEE 02/23/2018 EF  35-40, trivial AI, severe nonmobile atheroma and ascending aorta, mild MR  Echo 02/19/2018 Mild focal basal septal hypertrophy, EF 50-55, normal wall motion, grade 1 diastolic dysfunction, MAC, PASP 31  Nuclear stress test 05/28/2014 LAD territory ischemia, PDA territory infarct with peri-infarct ischemia, EF 48, intermediate risk  Catheterization 06/19/2014 LM normal LAD occluded -not a candidate for CTO PCI RI proximal 80-90 OM2 proximal 50-70, OM3 30 RCA mid 40 EF 50-55 PCI: 3 x 38 Promus DES to the proximal RI  Past Medical History:  Diagnosis Date  . Allergic reaction caused by a drug 11/2017  . Anemia   . Atrial flutter ([name] [name])    a. s/p RFCA of counterclockwise cavo-tricuspid isthmus dependent atrial flutter 2009 by Dr. Rayann [name].  Marland Kitchen CAD (coronary artery disease) 06/2014   a. 06/2014: abnl nuc. Cath: Totally occluded LAD with faint collaterals (not a candidate for CTO PCI), 90% ramus s/p DES, 50-70% OM2.  . Chronic diastolic CHF (congestive heart failure) ([place])    a. 2D Echo 10/23/13: EF 50-55%, basal mid-inf HK, grade 2 DD, mild MR, mod dilated LA, no sig change from prior.  . CKD (chronic kidney disease), stage III ([place])   . Colostomy in place [facility])   . Complication of anesthesia    " during my kidney stone surgery my heart went out of rhythm  . Compression fracture of L1 lumbar vertebra (  [place])   . DCM (dilated cardiomyopathy) ([place]) 02/27/2018   EF 35-40% by TEE 02/2018 felt tachycardia mediated  . Diabetes ([place])    TYPE 2   . Dyslipidemia   . Dyspnea   . Frequent PVCs   . GERD (gastroesophageal reflux disease)   . History of blood transfusion   . History of kidney stones   . History of peptic ulcer disease   . HTN (hypertension)   . Hx of acute renal failure 01/02/10-3/24-11   due to hypovolemic shock, gastroenteritis and dehydration,hospitalized . Did requre a few days of dialysis. Cr at discharge was 1.8  . Kidney disease   . Kidney stone may 2009   Right hydronephrosis,  S/P stone removal  . Lower back pain   . Macular degeneration   . OA (osteoarthritis) of hip   . Obesity   . Osteopenia   . PAF (paroxysmal atrial fibrillation) ([name])    not on long term anticoagulation due to history of heme positive stools and anemia  . Shingles    episode  . Small bowel obstruction, partial ([place]) 2009   Episode  . Ulcerative colitis ([place])    a. s/p total colectomy remotely.   Surgical Hx: The patient  has a past surgical history that includes Back surgery; Colon resection; Hernia repair; Tonsillectomy; Cardiac catheterization (06/19/2014); Coronary angioplasty; Coronary stent placement (06/19/2014); left and right heart catheterization with coronary angiogram (N/A, 06/19/2014); ORIF ankle fracture (Left, 10/25/2017); Colostomy; Hardware Removal (Left, 12/01/2017); Cardioversion (N/A, 02/23/2018); and TEE without cardioversion (N/A, 02/23/2018).   Current Medications: Current Meds  Medication Sig  . acetaminophen (TYLENOL) 325 MG tablet Take 2 tablets (650 mg total) by mouth every 6 (six) hours as needed for mild pain, moderate pain or headache.  . Amino Acids-Protein Hydrolys (FEEDING SUPPLEMENT, PRO-STAT SUGAR FREE 64,) LIQD Take 30 mLs by mouth daily.  Marland Kitchen aspirin EC 81 MG EC tablet Take 1 tablet (81 mg total) by mouth daily.  Marland Kitchen atorvastatin (LIPITOR) 40 MG tablet Take 1 tablet (40 mg total) by mouth daily at 6 PM.  . calcium carbonate (OS-CAL) 600 MG TABS tablet Take 600 mg by mouth daily with breakfast.  . Dermatological Products, Misc. (MOISTURE BARRIER) OINT Apply 1 application topically 3 (three) times daily. To buttocks/sacrum  . docusate sodium (COLACE) 100 MG capsule Take 1 capsule (100 mg total) by mouth 2 (two) times daily.  . Multiple Vitamin (MULTIVITAMIN WITH MINERALS) TABS tablet Take 1 tablet by mouth daily.  Marland Kitchen omeprazole (PRILOSEC) 20 MG capsule Take 20 mg by mouth every evening.  . saxagliptin HCl (ONGLYZA) 5 MG TABS tablet Take 0.5 tablets (2.5 mg total)  by mouth daily.  . [DISCONTINUED] amiodarone (PACERONE) 200 MG tablet Take 1 tablet (200 mg total) by mouth daily.  . [DISCONTINUED] apixaban (ELIQUIS) 2.5 MG TABS tablet Take 1 tablet (2.5 mg total) by mouth 2 (two) times daily. (Patient taking differently: Take 2.5 mg by mouth daily. )  . [DISCONTINUED] furosemide (LASIX) 40 MG tablet Take 1 tablet (40 mg total) by mouth daily.     Allergies:   Brilinta [ticagrelor]; Clindamycin/lincomycin; Doxycycline; Penicillins; and Sulfa antibiotics   Social History   Tobacco Use  . Smoking status: Never Smoker  . Smokeless tobacco: Never Used  Substance Use Topics  . Alcohol use: No  . Drug use: No     Family Hx: The patient's family history includes Colon cancer in his mother.  ROS:   Please see the history of present illness.  ROS All other systems reviewed and are negative.   EKGs/Labs/Other Test Reviewed:    EKG:  EKG is  ordered today.  The ekg ordered today demonstrates atrial fibrillation, HR 78, LAD, QTc 469, PVCs  Recent Labs: 02/19/2018: B Natriuretic Peptide 488.1 03/27/2018: ALT 20 04/11/2018: BUN 43; Creatinine, Ser 1.47; Hemoglobin 10.6; Platelets 334; Potassium 4.6; Sodium 138   Recent Lipid Panel Lab Results  Component Value Date/Time   CHOL 112 03/27/2018 05:47 AM   TRIG 119 03/27/2018 05:47 AM   HDL 36 (L) 03/27/2018 05:47 AM   CHOLHDL 3.1 03/27/2018 05:47 AM   LDLCALC 52 03/27/2018 05:47 AM    Physical Exam:    VS:  BP (!) 124/58   Pulse 74   Ht _0  (1.778 m)   Wt 211 lb (95.7 kg)   SpO2 98%   BMI 30.28 kg/m     Wt Readings from Last 3 Encounters:  04/11/18 211 lb (95.7 kg)  03/28/18 233 lb 7.5 oz (105.9 kg)  03/14/18 225 lb (102.1 kg)     Physical Exam  Constitutional: He is oriented to person, place, and time. He appears well-developed and well-nourished. No distress (arrives in a wheelchair).  HENT:  Head: Normocephalic and atraumatic.  Neck: Neck supple.  Cardiovascular: Normal rate, S1  normal, S2 normal and normal heart sounds. An irregularly irregular rhythm present.  No murmur heard. Pulmonary/Chest: Effort normal. He has no rales.  Abdominal: Soft.  Musculoskeletal: He exhibits edema (1+ bilat LE edema).  Neurological: He is alert and oriented to person, place, and time.  Skin: Skin is warm and dry.    ASSESSMENT & PLAN:    Persistent atrial fibrillation ([place]) He is back in atrial fibrillation with controlled ventricular rate.  He seems to be asymptomatic.  He is on Amiodarone and remains on anticoagulation with Apixaban. His dose of Apixaban was decreased to 2.5 mg bid in the hospital because his creatinine was consistently > 1.5.  Most recent Creatinine was 1.36.  I have recommended increasing his dose of Amiodarone to 400 mg Once daily to see if he will go back into NSR on his own.  I will obtain a BMET today.  If his Creatinine remains < 1.5, will increase Apixaban to 5 mg bid.  Will bring [name] back in close follow up.  I reviewed his case with Dr. Fransico [name] today who agreed with the above plan.  She did suggest proceeding with DCCV if he remains in AFib at follow up.    -Increase Amiodarone to 400 mg Once daily   -BMET, CBC  -If Creatinine < 1.5, increase Apixaban to 5 mg Twice daily   -FU in 2 weeks  -Schedule DCCV if he remains in AFib.   Coronary artery disease involving native coronary artery of native heart without angina pectoris Hx of DES to RI and known CTO of the LAD.  He is not having any angina.  Continue statin.    Essential hypertension The patient's blood pressure is controlled on his current regimen.  Continue current therapy.    Chronic combined systolic and diastolic CHF (congestive heart failure) ([name]) EF 40-45 by Echo last month.  Volume status appears stable.  [name] EF felt to be related to RVR.  Consider repeat Echo in 2 mos.  If EF remains < 45, consider ACEi/beta-blocker therapy.  CKD (chronic kidney disease), stage III ([place]) Repeat BMET  today.   Dispo:  Return in about 2 weeks (around 04/25/2018) for Close Follow  Up w/ Dr. Radford Pax or [name], PA-C.   Medication Adjustments/Labs and Tests Ordered: Current medicines are reviewed at length with the patient today.  Concerns regarding medicines are outlined above.  Tests Ordered: Orders Placed This Encounter  Procedures  . Basic Metabolic Panel (BMET)  . CBC  . EKG 12-Lead   Medication Changes: Meds ordered this encounter  Medications  . DISCONTD: amiodarone (PACERONE) 400 MG tablet    Sig: Take 1 tablet (400 mg total) by mouth daily.    Dispense:  90 tablet    Refill:  3    INCREASE IN DOSE  . amiodarone (PACERONE) 400 MG tablet    Sig: Take 1 tablet (400 mg total) by mouth daily.    INCREASE IN DOSE    Signed, [name], PA-C  04/11/2018 5:41 PM    [facility] [address], [place], [place]  [address] Phone: [phone]; Fax: [phone]

## 2018-04-11 ENCOUNTER — Encounter: Payer: Self-pay | Admitting: Physician Assistant

## 2018-04-11 ENCOUNTER — Encounter (INDEPENDENT_AMBULATORY_CARE_PROVIDER_SITE_OTHER): Payer: Self-pay

## 2018-04-11 ENCOUNTER — Ambulatory Visit (INDEPENDENT_AMBULATORY_CARE_PROVIDER_SITE_OTHER): Payer: Medicare Other | Admitting: Physician Assistant

## 2018-04-11 ENCOUNTER — Telehealth: Payer: Self-pay | Admitting: *Deleted

## 2018-04-11 VITALS — BP 124/58 | HR 74 | Ht 70.0 in | Wt 211.0 lb

## 2018-04-11 DIAGNOSIS — N183 Chronic kidney disease, stage 3 unspecified: Secondary | ICD-10-CM

## 2018-04-11 DIAGNOSIS — I2510 Atherosclerotic heart disease of native coronary artery without angina pectoris: Secondary | ICD-10-CM

## 2018-04-11 DIAGNOSIS — I4819 Other persistent atrial fibrillation: Secondary | ICD-10-CM

## 2018-04-11 DIAGNOSIS — I5042 Chronic combined systolic (congestive) and diastolic (congestive) heart failure: Secondary | ICD-10-CM

## 2018-04-11 DIAGNOSIS — E1122 Type 2 diabetes mellitus with diabetic chronic kidney disease: Secondary | ICD-10-CM | POA: Diagnosis not present

## 2018-04-11 DIAGNOSIS — E1142 Type 2 diabetes mellitus with diabetic polyneuropathy: Secondary | ICD-10-CM | POA: Diagnosis not present

## 2018-04-11 DIAGNOSIS — S82852D Displaced trimalleolar fracture of left lower leg, subsequent encounter for closed fracture with routine healing: Secondary | ICD-10-CM | POA: Diagnosis not present

## 2018-04-11 DIAGNOSIS — I481 Persistent atrial fibrillation: Secondary | ICD-10-CM

## 2018-04-11 DIAGNOSIS — I10 Essential (primary) hypertension: Secondary | ICD-10-CM | POA: Diagnosis not present

## 2018-04-11 DIAGNOSIS — I130 Hypertensive heart and chronic kidney disease with heart failure and stage 1 through stage 4 chronic kidney disease, or unspecified chronic kidney disease: Secondary | ICD-10-CM | POA: Diagnosis not present

## 2018-04-11 DIAGNOSIS — I5043 Acute on chronic combined systolic (congestive) and diastolic (congestive) heart failure: Secondary | ICD-10-CM | POA: Diagnosis not present

## 2018-04-11 LAB — BASIC METABOLIC PANEL
BUN/Creatinine Ratio: 29 — ABNORMAL HIGH (ref 10–24)
BUN: 43 mg/dL — ABNORMAL HIGH (ref 8–27)
CO2: 21 mmol/L (ref 20–29)
Calcium: 8.6 mg/dL (ref 8.6–10.2)
Chloride: 104 mmol/L (ref 96–106)
Creatinine, Ser: 1.47 mg/dL — ABNORMAL HIGH (ref 0.76–1.27)
GFR calc Af Amer: 50 mL/min/{1.73_m2} — ABNORMAL LOW (ref >59)
GFR calc non Af Amer: 43 mL/min/{1.73_m2} — ABNORMAL LOW (ref >59)
Glucose: 172 mg/dL — ABNORMAL HIGH (ref 65–99)
Potassium: 4.6 mmol/L (ref 3.5–5.2)
Sodium: 138 mmol/L (ref 134–144)

## 2018-04-11 LAB — CBC
Hematocrit: 32.3 % — ABNORMAL LOW (ref 37.5–51.0)
Hemoglobin: 10.6 g/dL — ABNORMAL LOW (ref 13.0–17.7)
MCH: 30.4 pg (ref 26.6–33.0)
MCHC: 32.8 g/dL (ref 31.5–35.7)
MCV: 93 fL (ref 79–97)
Platelets: 334 10*3/uL (ref 150–450)
RBC: 3.49 x10E6/uL — ABNORMAL LOW (ref 4.14–5.80)
RDW: 15.3 % (ref 12.3–15.4)
WBC: 9 10*3/uL (ref 3.4–10.8)

## 2018-04-11 MED ORDER — AMIODARONE HCL 400 MG PO TABS: 400.0000 mg | ORAL_TABLET | Freq: Every day | ORAL | Status: DC

## 2018-04-11 MED ORDER — APIXABAN 5 MG PO TABS: 5.0000 mg | ORAL_TABLET | Freq: Two times a day (BID) | ORAL | Status: DC

## 2018-04-11 MED ORDER — AMIODARONE HCL 400 MG PO TABS: 400.0000 mg | ORAL_TABLET | Freq: Every day | ORAL | 3 refills | Status: DC

## 2018-04-11 MED ORDER — FUROSEMIDE 40 MG PO TABS: 40.0000 mg | ORAL_TABLET | ORAL | Status: DC

## 2018-04-11 NOTE — Telephone Encounter (Signed)
I s/w nurse at [facility] in regards to medication changes. I advised new dose of Apixaban 5 mg BID starting tonight. New dose of lasix alternate 40 mg QOD with 20 mg QOD. Will need daily weights. Call [phone] for further advice if pt's weight is up 3 lb's x 1 day. BMET will be done when pt comes in for his follow up appt with [name], PA 04/24/18 @2 :15 pm. I will fax to Med Tech per nurse to fax # [phone] at [facility] on [place] location.

## 2018-04-11 NOTE — Telephone Encounter (Signed)
-----   Message from Liliane Shi, [place] sent at 04/11/2018  4:52 PM EDT ----- The hemoglobin is stable/improved.  The renal function is fairly stable.  He may be getting a little dry (BUN higher).  The potassium is normal.  Based upon his renal function, he should be on higher dose Apixaban. PLAN:  1. Decrease Lasix to 40 mg every other day alternated with 20 mg every other day. 2. Increase Apixaban 5 mg Twice daily (first dose tonight) 3. BMET 1 week 4. Weigh daily and call if weight increases by >3 lbs in 1 day. 5. Follow up as planned.  [name], PA-C    04/11/2018 4:40 PM

## 2018-04-11 NOTE — Patient Instructions (Addendum)
Medication Instructions:  1. INCREASE AMIODARONE TO 400 MG DAILY  Labwork: TODAY BMET, CBC  Testing/Procedures: NONE ORDERED TODAY  Follow-Up: [name], [facility] ON 04/24/18 @ 2:15 PM   Any Other Special Instructions Will Be Listed Below (If Applicable).     If you need a refill on your cardiac medications before your next appointment, please call your pharmacy.

## 2018-04-16 DIAGNOSIS — I2510 Atherosclerotic heart disease of native coronary artery without angina pectoris: Secondary | ICD-10-CM | POA: Diagnosis not present

## 2018-04-16 DIAGNOSIS — S82852D Displaced trimalleolar fracture of left lower leg, subsequent encounter for closed fracture with routine healing: Secondary | ICD-10-CM | POA: Diagnosis not present

## 2018-04-16 DIAGNOSIS — E1122 Type 2 diabetes mellitus with diabetic chronic kidney disease: Secondary | ICD-10-CM | POA: Diagnosis not present

## 2018-04-16 DIAGNOSIS — I130 Hypertensive heart and chronic kidney disease with heart failure and stage 1 through stage 4 chronic kidney disease, or unspecified chronic kidney disease: Secondary | ICD-10-CM | POA: Diagnosis not present

## 2018-04-16 DIAGNOSIS — E1142 Type 2 diabetes mellitus with diabetic polyneuropathy: Secondary | ICD-10-CM | POA: Diagnosis not present

## 2018-04-16 DIAGNOSIS — I5043 Acute on chronic combined systolic (congestive) and diastolic (congestive) heart failure: Secondary | ICD-10-CM | POA: Diagnosis not present

## 2018-04-19 DIAGNOSIS — I2510 Atherosclerotic heart disease of native coronary artery without angina pectoris: Secondary | ICD-10-CM | POA: Diagnosis not present

## 2018-04-19 DIAGNOSIS — I130 Hypertensive heart and chronic kidney disease with heart failure and stage 1 through stage 4 chronic kidney disease, or unspecified chronic kidney disease: Secondary | ICD-10-CM | POA: Diagnosis not present

## 2018-04-19 DIAGNOSIS — E1122 Type 2 diabetes mellitus with diabetic chronic kidney disease: Secondary | ICD-10-CM | POA: Diagnosis not present

## 2018-04-19 DIAGNOSIS — E1142 Type 2 diabetes mellitus with diabetic polyneuropathy: Secondary | ICD-10-CM | POA: Diagnosis not present

## 2018-04-19 DIAGNOSIS — S82852D Displaced trimalleolar fracture of left lower leg, subsequent encounter for closed fracture with routine healing: Secondary | ICD-10-CM | POA: Diagnosis not present

## 2018-04-19 DIAGNOSIS — I5043 Acute on chronic combined systolic (congestive) and diastolic (congestive) heart failure: Secondary | ICD-10-CM | POA: Diagnosis not present

## 2018-04-20 DIAGNOSIS — S82852D Displaced trimalleolar fracture of left lower leg, subsequent encounter for closed fracture with routine healing: Secondary | ICD-10-CM | POA: Diagnosis not present

## 2018-04-20 DIAGNOSIS — I130 Hypertensive heart and chronic kidney disease with heart failure and stage 1 through stage 4 chronic kidney disease, or unspecified chronic kidney disease: Secondary | ICD-10-CM | POA: Diagnosis not present

## 2018-04-20 DIAGNOSIS — I5043 Acute on chronic combined systolic (congestive) and diastolic (congestive) heart failure: Secondary | ICD-10-CM | POA: Diagnosis not present

## 2018-04-20 DIAGNOSIS — E1122 Type 2 diabetes mellitus with diabetic chronic kidney disease: Secondary | ICD-10-CM | POA: Diagnosis not present

## 2018-04-20 DIAGNOSIS — I2510 Atherosclerotic heart disease of native coronary artery without angina pectoris: Secondary | ICD-10-CM | POA: Diagnosis not present

## 2018-04-20 DIAGNOSIS — E1142 Type 2 diabetes mellitus with diabetic polyneuropathy: Secondary | ICD-10-CM | POA: Diagnosis not present

## 2018-04-23 ENCOUNTER — Ambulatory Visit (INDEPENDENT_AMBULATORY_CARE_PROVIDER_SITE_OTHER): Payer: Medicare Other | Admitting: Orthopedic Surgery

## 2018-04-23 ENCOUNTER — Encounter: Payer: Self-pay | Admitting: Cardiology

## 2018-04-23 DIAGNOSIS — S82852D Displaced trimalleolar fracture of left lower leg, subsequent encounter for closed fracture with routine healing: Secondary | ICD-10-CM | POA: Diagnosis not present

## 2018-04-23 DIAGNOSIS — E1142 Type 2 diabetes mellitus with diabetic polyneuropathy: Secondary | ICD-10-CM | POA: Diagnosis not present

## 2018-04-23 DIAGNOSIS — I5043 Acute on chronic combined systolic (congestive) and diastolic (congestive) heart failure: Secondary | ICD-10-CM | POA: Diagnosis not present

## 2018-04-23 DIAGNOSIS — E1122 Type 2 diabetes mellitus with diabetic chronic kidney disease: Secondary | ICD-10-CM | POA: Diagnosis not present

## 2018-04-23 DIAGNOSIS — I2510 Atherosclerotic heart disease of native coronary artery without angina pectoris: Secondary | ICD-10-CM | POA: Diagnosis not present

## 2018-04-23 DIAGNOSIS — I130 Hypertensive heart and chronic kidney disease with heart failure and stage 1 through stage 4 chronic kidney disease, or unspecified chronic kidney disease: Secondary | ICD-10-CM | POA: Diagnosis not present

## 2018-04-24 ENCOUNTER — Ambulatory Visit (INDEPENDENT_AMBULATORY_CARE_PROVIDER_SITE_OTHER): Payer: Medicare Other | Admitting: Physician Assistant

## 2018-04-24 ENCOUNTER — Encounter: Payer: Self-pay | Admitting: Physician Assistant

## 2018-04-24 VITALS — BP 124/64 | HR 70 | Ht 70.0 in | Wt 238.0 lb

## 2018-04-24 DIAGNOSIS — I5042 Chronic combined systolic (congestive) and diastolic (congestive) heart failure: Secondary | ICD-10-CM

## 2018-04-24 DIAGNOSIS — E1142 Type 2 diabetes mellitus with diabetic polyneuropathy: Secondary | ICD-10-CM | POA: Diagnosis not present

## 2018-04-24 DIAGNOSIS — I5043 Acute on chronic combined systolic (congestive) and diastolic (congestive) heart failure: Secondary | ICD-10-CM | POA: Diagnosis not present

## 2018-04-24 DIAGNOSIS — I2510 Atherosclerotic heart disease of native coronary artery without angina pectoris: Secondary | ICD-10-CM

## 2018-04-24 DIAGNOSIS — I130 Hypertensive heart and chronic kidney disease with heart failure and stage 1 through stage 4 chronic kidney disease, or unspecified chronic kidney disease: Secondary | ICD-10-CM | POA: Diagnosis not present

## 2018-04-24 DIAGNOSIS — E1122 Type 2 diabetes mellitus with diabetic chronic kidney disease: Secondary | ICD-10-CM | POA: Diagnosis not present

## 2018-04-24 DIAGNOSIS — N183 Chronic kidney disease, stage 3 unspecified: Secondary | ICD-10-CM

## 2018-04-24 DIAGNOSIS — S82852D Displaced trimalleolar fracture of left lower leg, subsequent encounter for closed fracture with routine healing: Secondary | ICD-10-CM | POA: Diagnosis not present

## 2018-04-24 DIAGNOSIS — I481 Persistent atrial fibrillation: Secondary | ICD-10-CM | POA: Diagnosis not present

## 2018-04-24 DIAGNOSIS — I4819 Other persistent atrial fibrillation: Secondary | ICD-10-CM

## 2018-04-24 MED ORDER — AMIODARONE HCL 200 MG PO TABS: 400.0000 mg | ORAL_TABLET | ORAL | Status: DC

## 2018-04-24 MED ORDER — FUROSEMIDE 40 MG PO TABS: 40.0000 mg | ORAL_TABLET | ORAL | Status: DC

## 2018-04-24 NOTE — Progress Notes (Signed)
Cardiology Office Note:    Date:  04/24/2018   ID:  [name] [name], DOB May 28, 1933, MRN [id number]  PCP:  [name], MD  Cardiologist:  [name], MD   Referring MD: [name], MD   Chief Complaint  Patient presents with  . Follow-up    CHF, atrial fibrillation    History of Present Illness:    [name] [name] is a 84 y.o. male with coronary artery disease s/p prior PCI with DES to the RI and known chronic total occlusion of the LAD, paroxysmal atrial fibrillation, diastolic CHF, atrial flutter s/p ablation, chronic kidney disease, diabetes, hypertension, Ulcerative Colitis s/p colectomy.  He has not been on anticoagulation due to a history of GI bleeding.  He was admitted in 02/2018 with bradycardia in the setting of profound hyperkalemia.  His HR recovered with reversal of the hyperkalemia but he developed AFib with RVR while his Amiodarone and beta-blocker were on hold.  He was placed on Apixaban and underwent TEE guided DCCV.  His EF was 35-40%.   Follow up echo in May 2019 demonstrated EF 40-45 and PASP 44.    He was admitted in June 2019 with a pelvic fracture after a fall.  He followed up here 04/11/2018.  He was back in atrial fibrillation.  I reviewed his case with Dr. Radford Pax.  I increased his amiodarone to 400 mg daily.  He would need repeat cardioversion if he remained in atrial fibrillation.  [name] [name] returns for follow-up.  He remains at [facility] skilled nursing facility.  He is here today with his sister.  Since last seen, he notes worsening lower extremity swelling.  By our scales, his weight is up 27 pounds.  He does note orthopnea and questionable paroxysmal nocturnal dyspnea.  He denies chest discomfort or syncope.  He does note worsening shortness of breath.  Prior CV studies:   The following studies were reviewed today:   Echo 03/14/18 Mild conc LVH, EF 40-45, diff HK, Gr 1 DD, trivial AI, severe LAE, mod RAE, normal RVSF, mild TR, PASP 44, GLS -12.5%  (abnormal)  TEE 02/23/2018 EF 35-40, trivial AI, severe nonmobile atheroma and ascending aorta, mild MR  Echo 02/19/2018 Mild focal basal septal hypertrophy, EF 50-55, normal wall motion, grade 1 diastolic dysfunction, MAC, PASP 31  Nuclear stress test 05/28/2014 LAD territory ischemia, PDA territory infarct with peri-infarct ischemia, EF 48, intermediate risk  Catheterization 06/19/2014 LM normal LAD occluded -not a candidate for CTO PCI RI proximal 80-90 OM2 proximal 50-70, OM3 30 RCA mid 40 EF 50-55 PCI: 3 x 38 Promus DES to the proximal RI  Past Medical History:  Diagnosis Date  . Allergic reaction caused by a drug 11/2017  . Anemia   . Atrial flutter ([place])    a. s/p RFCA of counterclockwise cavo-tricuspid isthmus dependent atrial flutter 2009 by Dr. Rayann Heman.  Marland Kitchen CAD (coronary artery disease) 06/2014   a. 06/2014: abnl nuc. Cath: Totally occluded LAD with faint collaterals (not a candidate for CTO PCI), 90% ramus s/p DES, 50-70% OM2.  . Chronic diastolic CHF (congestive heart failure) ([place])    a. 2D Echo 10/23/13: EF 50-55%, basal mid-inf HK, grade 2 DD, mild MR, mod dilated LA, no sig change from prior.  . CKD (chronic kidney disease), stage III ([place])   . Colostomy in place [name])   . Complication of anesthesia    " during my kidney stone surgery my heart went out of rhythm  . Compression fracture of L1  lumbar vertebra ([place])   . DCM (dilated cardiomyopathy) ([place]) 02/27/2018   EF 35-40% by TEE 02/2018 felt tachycardia mediated  . Diabetes ([place])    TYPE 2   . Dyslipidemia   . Dyspnea   . Frequent PVCs   . GERD (gastroesophageal reflux disease)   . History of blood transfusion   . History of kidney stones   . History of peptic ulcer disease   . HTN (hypertension)   . Hx of acute renal failure 01/02/10-3/24-11   due to hypovolemic shock, gastroenteritis and dehydration,hospitalized . Did requre a few days of dialysis. Cr at discharge was 1.8  . Kidney disease   . Kidney stone  may 2009   Right hydronephrosis, S/P stone removal  . Lower back pain   . Macular degeneration   . OA (osteoarthritis) of hip   . Obesity   . Osteopenia   . PAF (paroxysmal atrial fibrillation) ([name])    not on long term anticoagulation due to history of heme positive stools and anemia  . Shingles    episode  . Small bowel obstruction, partial ([place]) 2009   Episode  . Ulcerative colitis ([place])    a. s/p total colectomy remotely.   Surgical Hx: The patient  has a past surgical history that includes Back surgery; Colon resection; Hernia repair; Tonsillectomy; Cardiac catheterization (06/19/2014); Coronary angioplasty; Coronary stent placement (06/19/2014); left and right heart catheterization with coronary angiogram (N/A, 06/19/2014); ORIF ankle fracture (Left, 10/25/2017); Colostomy; Hardware Removal (Left, 12/01/2017); Cardioversion (N/A, 02/23/2018); and TEE without cardioversion (N/A, 02/23/2018).   Current Medications: Current Meds  Medication Sig  . acetaminophen (TYLENOL) 325 MG tablet Take 2 tablets (650 mg total) by mouth every 6 (six) hours as needed for mild pain, moderate pain or headache.  . Amino Acids-Protein Hydrolys (FEEDING SUPPLEMENT, PRO-STAT SUGAR FREE 64,) LIQD Take 30 mLs by mouth daily.  Marland Kitchen apixaban (ELIQUIS) 5 MG TABS tablet Take 1 tablet (5 mg total) by mouth 2 (two) times daily.  Marland Kitchen aspirin EC 81 MG EC tablet Take 1 tablet (81 mg total) by mouth daily.  Marland Kitchen atorvastatin (LIPITOR) 40 MG tablet Take 1 tablet (40 mg total) by mouth daily at 6 PM.  . calcium carbonate (OS-CAL) 600 MG TABS tablet Take 600 mg by mouth daily with breakfast.  . Dermatological Products, Misc. (MOISTURE BARRIER) OINT Apply 1 application topically 3 (three) times daily. To buttocks/sacrum  . docusate sodium (COLACE) 100 MG capsule Take 1 capsule (100 mg total) by mouth 2 (two) times daily.  . Multiple Vitamin (MULTIVITAMIN WITH MINERALS) TABS tablet Take 1 tablet by mouth daily.  Marland Kitchen omeprazole  (PRILOSEC) 20 MG capsule Take 20 mg by mouth every evening.  . saxagliptin HCl (ONGLYZA) 5 MG TABS tablet Take 0.5 tablets (2.5 mg total) by mouth daily.  . [DISCONTINUED] amiodarone (PACERONE) 400 MG tablet Take 1 tablet (400 mg total) by mouth daily.  . [DISCONTINUED] furosemide (LASIX) 40 MG tablet Take 1 tablet (40 mg total) by mouth as directed. Alternate 40 mg QOD with 20 mg QOD     Allergies:   Brilinta [ticagrelor]; Clindamycin/lincomycin; Doxycycline; Penicillins; and Sulfa antibiotics   Social History   Tobacco Use  . Smoking status: Never Smoker  . Smokeless tobacco: Never Used  Substance Use Topics  . Alcohol use: No  . Drug use: No     Family Hx: The patient's family history includes Colon cancer in his mother.  ROS:   Please see the history of present illness.  Review of Systems  HENT: Positive for hearing loss.   Cardiovascular: Positive for leg swelling.  Musculoskeletal: Positive for joint pain.   All other systems reviewed and are negative.   EKGs/Labs/Other Test Reviewed:    EKG:  EKG is  ordered today.  The ekg ordered today demonstrates normal sinus rhythm, heart rate 70, left axis deviation  Recent Labs: 02/19/2018: B Natriuretic Peptide 488.1 03/27/2018: ALT 20 04/11/2018: BUN 43; Creatinine, Ser 1.47; Hemoglobin 10.6; Platelets 334; Potassium 4.6; Sodium 138   Recent Lipid Panel Lab Results  Component Value Date/Time   CHOL 112 03/27/2018 05:47 AM   TRIG 119 03/27/2018 05:47 AM   HDL 36 (L) 03/27/2018 05:47 AM   CHOLHDL 3.1 03/27/2018 05:47 AM   LDLCALC 52 03/27/2018 05:47 AM    Physical Exam:    VS:  BP 124/64   Pulse 70   Ht 5' 10"  (1.778 m)   Wt 238 lb (108 kg) Comment: severe fluid retention  SpO2 98%   BMI 34.15 kg/m     Wt Readings from Last 3 Encounters:  04/24/18 238 lb (108 kg)  04/11/18 211 lb (95.7 kg)  03/28/18 233 lb 7.5 oz (105.9 kg)     Physical Exam  Constitutional: He is oriented to person, place, and time. He  appears well-developed and well-nourished. No distress.  HENT:  Head: Normocephalic and atraumatic.  Neck: Neck supple.  Cardiovascular: Normal rate and regular rhythm.  No murmur heard. Pulmonary/Chest: Effort normal. He has no rales.  Abdominal: Soft. He exhibits no distension.  Musculoskeletal: He exhibits edema (2 plus bilateral LE edema up to the knees).  Neurological: He is alert and oriented to person, place, and time.  Skin: Skin is warm and dry.    ASSESSMENT & PLAN:    Persistent atrial fibrillation ([name]) He is back in normal sinus rhythm.  Continue current dose of amiodarone for 2 more weeks, then reduce to 200 mg daily.  Continue Apixaban 5 mg twice daily.  Obtain BMET today.  If his creatinine goes back above 1.5, we will need to reduce his dose of Apixaban back to 2.5 mg twice daily.  Acute on chronic combined systolic and diastolic CHF (congestive heart failure) ([place]) At last visit, we decreased his Lasix due to worsening creatinine.  Since then, his weight is increased and he has worsening lower extremity edema.  By our scales, his weight is up 27 pounds.  Question if his weight last time was accurate.  In any event he is volume overloaded.  I will increase his Lasix to 40 mg twice daily for 3 days.  He will then reduce his Lasix to 60 mg daily.  Obtain BMET today and repeat in 1 week.  Follow-up with me in 1 week.  CKD (chronic kidney disease), stage III ([place]) I will keep a close eye on his renal function potassium with adjustments of Lasix with a BMET today and a BMET in 1 week.  Coronary artery disease involving native coronary artery of native heart without angina pectoris History of drug-eluting stent to the ramus intermediate and known chronic total occlusion of the LAD.  He denies anginal symptoms.  Continue aspirin, statin.   Dispo:  Return in about 1 week (around 05/01/2018) for Close Follow Up, w/ [name], PA-C.   Medication Adjustments/Labs and Tests  Ordered: Current medicines are reviewed at length with the patient today.  Concerns regarding medicines are outlined above.  Tests Ordered: Orders Placed This Encounter  Procedures  .  Basic Metabolic Panel (BMET)  . Basic Metabolic Panel (BMET)  . EKG 12-Lead   Medication Changes: Meds ordered this encounter  Medications  . amiodarone (PACERONE) 200 MG tablet    Sig: Take 2 tablets (400 mg total) by mouth as directed. BEGINNING ON 05/13/18 DECREASE TO 200 MG DAILY  . furosemide (LASIX) 40 MG tablet    Sig: Take 1 tablet (40 mg total) by mouth as directed. 40 mg twice daily for 3 days; then change to 60 mg daily    Signed, [name], PA-C  04/24/2018 [facility] [place], [place], [place]  [address] Phone: [phone]; Fax: [phone]

## 2018-04-24 NOTE — Patient Instructions (Signed)
Medication Instructions:  1. CONTINUE ON AMIODARONE 400 MG DAILY UNTIL 05/13/18 YOU WILL DECREASE AMIODARONE TO 200 MG DAILY  2. INCREASE LASIX TO 40 MG TWICE DAILY FOR 3 DAYS; AFTER THE 3 DAYS CHANGE LASIX TO 60 MG DAILY  Labwork: 1. TODAY BMET  2. IN 1 WEEK BMET 05/01/18   Testing/Procedures: NONE ORDERED TODAY  Follow-Up: DR. Radford Pax 05/01/18 @ 11:40 AM   Any Other Special Instructions Will Be Listed Below (If Applicable).     If you need a refill on your cardiac medications before your next appointment, please call your pharmacy.

## 2018-04-25 ENCOUNTER — Ambulatory Visit: Payer: Medicare Other | Admitting: Physician Assistant

## 2018-04-25 LAB — BASIC METABOLIC PANEL
BUN/Creatinine Ratio: 23 (ref 10–24)
BUN: 40 mg/dL — ABNORMAL HIGH (ref 8–27)
CO2: 23 mmol/L (ref 20–29)
Calcium: 8.4 mg/dL — ABNORMAL LOW (ref 8.6–10.2)
Chloride: 103 mmol/L (ref 96–106)
Creatinine, Ser: 1.71 mg/dL — ABNORMAL HIGH (ref 0.76–1.27)
GFR calc Af Amer: 42 mL/min/{1.73_m2} — ABNORMAL LOW (ref >59)
GFR calc non Af Amer: 36 mL/min/{1.73_m2} — ABNORMAL LOW (ref >59)
Glucose: 167 mg/dL — ABNORMAL HIGH (ref 65–99)
Potassium: 4.6 mmol/L (ref 3.5–5.2)
Sodium: 141 mmol/L (ref 134–144)

## 2018-04-26 DIAGNOSIS — R269 Unspecified abnormalities of gait and mobility: Secondary | ICD-10-CM | POA: Diagnosis not present

## 2018-04-26 DIAGNOSIS — S82852D Displaced trimalleolar fracture of left lower leg, subsequent encounter for closed fracture with routine healing: Secondary | ICD-10-CM | POA: Diagnosis not present

## 2018-04-26 DIAGNOSIS — S329XXD Fracture of unspecified parts of lumbosacral spine and pelvis, subsequent encounter for fracture with routine healing: Secondary | ICD-10-CM | POA: Diagnosis not present

## 2018-04-26 DIAGNOSIS — I130 Hypertensive heart and chronic kidney disease with heart failure and stage 1 through stage 4 chronic kidney disease, or unspecified chronic kidney disease: Secondary | ICD-10-CM | POA: Diagnosis not present

## 2018-04-26 DIAGNOSIS — I2510 Atherosclerotic heart disease of native coronary artery without angina pectoris: Secondary | ICD-10-CM | POA: Diagnosis not present

## 2018-04-26 DIAGNOSIS — E1142 Type 2 diabetes mellitus with diabetic polyneuropathy: Secondary | ICD-10-CM | POA: Diagnosis not present

## 2018-04-26 DIAGNOSIS — I5030 Unspecified diastolic (congestive) heart failure: Secondary | ICD-10-CM | POA: Diagnosis not present

## 2018-04-26 DIAGNOSIS — I10 Essential (primary) hypertension: Secondary | ICD-10-CM | POA: Diagnosis not present

## 2018-04-26 DIAGNOSIS — N183 Chronic kidney disease, stage 3 (moderate): Secondary | ICD-10-CM | POA: Diagnosis not present

## 2018-04-26 DIAGNOSIS — E1122 Type 2 diabetes mellitus with diabetic chronic kidney disease: Secondary | ICD-10-CM | POA: Diagnosis not present

## 2018-04-26 DIAGNOSIS — I5043 Acute on chronic combined systolic (congestive) and diastolic (congestive) heart failure: Secondary | ICD-10-CM | POA: Diagnosis not present

## 2018-04-26 DIAGNOSIS — I872 Venous insufficiency (chronic) (peripheral): Secondary | ICD-10-CM | POA: Diagnosis not present

## 2018-04-27 DIAGNOSIS — E1142 Type 2 diabetes mellitus with diabetic polyneuropathy: Secondary | ICD-10-CM | POA: Diagnosis not present

## 2018-04-27 DIAGNOSIS — E1122 Type 2 diabetes mellitus with diabetic chronic kidney disease: Secondary | ICD-10-CM | POA: Diagnosis not present

## 2018-04-27 DIAGNOSIS — S82852D Displaced trimalleolar fracture of left lower leg, subsequent encounter for closed fracture with routine healing: Secondary | ICD-10-CM | POA: Diagnosis not present

## 2018-04-27 DIAGNOSIS — I2510 Atherosclerotic heart disease of native coronary artery without angina pectoris: Secondary | ICD-10-CM | POA: Diagnosis not present

## 2018-04-27 DIAGNOSIS — I5043 Acute on chronic combined systolic (congestive) and diastolic (congestive) heart failure: Secondary | ICD-10-CM | POA: Diagnosis not present

## 2018-04-27 DIAGNOSIS — F4323 Adjustment disorder with mixed anxiety and depressed mood: Secondary | ICD-10-CM | POA: Diagnosis not present

## 2018-04-27 DIAGNOSIS — I130 Hypertensive heart and chronic kidney disease with heart failure and stage 1 through stage 4 chronic kidney disease, or unspecified chronic kidney disease: Secondary | ICD-10-CM | POA: Diagnosis not present

## 2018-04-30 DIAGNOSIS — S82852D Displaced trimalleolar fracture of left lower leg, subsequent encounter for closed fracture with routine healing: Secondary | ICD-10-CM | POA: Diagnosis not present

## 2018-04-30 DIAGNOSIS — I130 Hypertensive heart and chronic kidney disease with heart failure and stage 1 through stage 4 chronic kidney disease, or unspecified chronic kidney disease: Secondary | ICD-10-CM | POA: Diagnosis not present

## 2018-04-30 DIAGNOSIS — I5043 Acute on chronic combined systolic (congestive) and diastolic (congestive) heart failure: Secondary | ICD-10-CM | POA: Diagnosis not present

## 2018-04-30 DIAGNOSIS — I2510 Atherosclerotic heart disease of native coronary artery without angina pectoris: Secondary | ICD-10-CM | POA: Diagnosis not present

## 2018-04-30 DIAGNOSIS — E1122 Type 2 diabetes mellitus with diabetic chronic kidney disease: Secondary | ICD-10-CM | POA: Diagnosis not present

## 2018-04-30 DIAGNOSIS — E1142 Type 2 diabetes mellitus with diabetic polyneuropathy: Secondary | ICD-10-CM | POA: Diagnosis not present

## 2018-05-01 ENCOUNTER — Encounter: Payer: Self-pay | Admitting: Cardiology

## 2018-05-01 ENCOUNTER — Other Ambulatory Visit: Payer: Medicare Other

## 2018-05-01 ENCOUNTER — Ambulatory Visit (INDEPENDENT_AMBULATORY_CARE_PROVIDER_SITE_OTHER): Payer: Medicare Other | Admitting: Cardiology

## 2018-05-01 VITALS — BP 132/60 | HR 68 | Ht 70.0 in | Wt 234.8 lb

## 2018-05-01 DIAGNOSIS — I5042 Chronic combined systolic (congestive) and diastolic (congestive) heart failure: Secondary | ICD-10-CM | POA: Diagnosis not present

## 2018-05-01 DIAGNOSIS — I2510 Atherosclerotic heart disease of native coronary artery without angina pectoris: Secondary | ICD-10-CM

## 2018-05-01 DIAGNOSIS — N183 Chronic kidney disease, stage 3 unspecified: Secondary | ICD-10-CM

## 2018-05-01 DIAGNOSIS — I10 Essential (primary) hypertension: Secondary | ICD-10-CM | POA: Diagnosis not present

## 2018-05-01 DIAGNOSIS — E785 Hyperlipidemia, unspecified: Secondary | ICD-10-CM | POA: Diagnosis not present

## 2018-05-01 DIAGNOSIS — I481 Persistent atrial fibrillation: Secondary | ICD-10-CM | POA: Diagnosis not present

## 2018-05-01 DIAGNOSIS — I420 Dilated cardiomyopathy: Secondary | ICD-10-CM

## 2018-05-01 DIAGNOSIS — I4819 Other persistent atrial fibrillation: Secondary | ICD-10-CM

## 2018-05-01 LAB — BASIC METABOLIC PANEL
BUN/Creatinine Ratio: 27 — ABNORMAL HIGH (ref 10–24)
BUN: 47 mg/dL — ABNORMAL HIGH (ref 8–27)
CO2: 22 mmol/L (ref 20–29)
Calcium: 8.7 mg/dL (ref 8.6–10.2)
Chloride: 100 mmol/L (ref 96–106)
Creatinine, Ser: 1.72 mg/dL — ABNORMAL HIGH (ref 0.76–1.27)
GFR calc Af Amer: 42 mL/min/{1.73_m2} — ABNORMAL LOW (ref >59)
GFR calc non Af Amer: 36 mL/min/{1.73_m2} — ABNORMAL LOW (ref >59)
Glucose: 192 mg/dL — ABNORMAL HIGH (ref 65–99)
Potassium: 4.5 mmol/L (ref 3.5–5.2)
Sodium: 137 mmol/L (ref 134–144)

## 2018-05-01 NOTE — Progress Notes (Signed)
Cardiology Office Note:    Date:  05/01/2018   ID:  [name] [name], DOB 1933-07-13, MRN [id number]  PCP:  [name], MD  Cardiologist:  [name], MD    Referring MD: [name], MD   Chief Complaint  Patient presents with  . Coronary Artery Disease  . Hypertension  . Atrial Fibrillation  . Hyperlipidemia  . Congestive Heart Failure    History of Present Illness:    [name] [name] is a 84 y.o. male with a hx of coronary artery diseases/p prior PCI with DES to the RI and known chronic total occlusion of the LAD, paroxysmalatrial fibrillation, diastolic CHF, atrial flutter s/p ablation, chronic kidney disease,diabetes,hypertension,Ulcerative Colitiss/p colectomy. He has not been on anticoagulationdue to a history of GI bleeding. He was admitted in 02/2018 with bradycardia in the setting of profound hyperkalemia. His HR recovered with reversal of the hyperkalemia but he developed AFib with RVR while his Amiodarone and beta-blockerwere on hold. He was placed on Apixaban and underwent TEE guided DCCV. His EF was 35-40%. Follow up echo in May 2019 demonstrated EF 40-45 and PASP 44.   He was admitted in June 2019 with a pelvic fracture after a fall.  He followed up here 04/11/2018.  He was back in atrial fibrillation. Amiodarone was increased to 400 mg daily.  He is now here for follow-up.  He continues to reside in SNF at this time.  He is here today for followup and is doing well.  He denies any chest pain or pressure, SOB, DOE, PND, orthopnea, LE edema, dizziness, palpitations or syncope. He is compliant with his meds and is tolerating meds with no SE.     Past Medical History:  Diagnosis Date  . Allergic reaction caused by a drug 11/2017  . Anemia   . Atrial flutter ([place])    a. s/p RFCA of counterclockwise cavo-tricuspid isthmus dependent atrial flutter 2009 by Dr. Rayann Heman.  Marland Kitchen CAD (coronary artery disease) 06/2014   a. 06/2014: abnl nuc. Cath: Totally occluded LAD  with faint collaterals (not a candidate for CTO PCI), 90% ramus s/p DES, 50-70% OM2.  . Chronic diastolic CHF (congestive heart failure) ([place])    a. 2D Echo 10/23/13: EF 50-55%, basal mid-inf HK, grade 2 DD, mild MR, mod dilated LA, no sig change from prior.  . CKD (chronic kidney disease), stage III ([place])   . Colostomy in place [facility])   . Complication of anesthesia    " during my kidney stone surgery my heart went out of rhythm  . Compression fracture of L1 lumbar vertebra ([name])   . DCM (dilated cardiomyopathy) ([place]) 02/27/2018   EF 35-40% by TEE 02/2018 felt tachycardia mediated  . Diabetes ([place])    TYPE 2   . Dyslipidemia   . Dyspnea   . Frequent PVCs   . GERD (gastroesophageal reflux disease)   . History of blood transfusion   . History of kidney stones   . History of peptic ulcer disease   . HTN (hypertension)   . Hx of acute renal failure 01/02/10-3/24-11   due to hypovolemic shock, gastroenteritis and dehydration,hospitalized . Did requre a few days of dialysis. Cr at discharge was 1.8  . Kidney disease   . Kidney stone may 2009   Right hydronephrosis, S/P stone removal  . Lower back pain   . Macular degeneration   . OA (osteoarthritis) of hip   . Obesity   . Osteopenia   . PAF (paroxysmal atrial fibrillation) (  [place])    not on long term anticoagulation due to history of heme positive stools and anemia  . Shingles    episode  . Small bowel obstruction, partial ([place]) 2009   Episode  . Ulcerative colitis ([place])    a. s/p total colectomy remotely.    Past Surgical History:  Procedure Laterality Date  . BACK SURGERY    . CARDIAC CATHETERIZATION  06/19/2014  . CARDIOVERSION N/A 02/23/2018   Procedure: CARDIOVERSION;  Surgeon: Dorothy Spark, MD;  Location: [facility] ENDOSCOPY;  Service: Cardiovascular;  Laterality: N/A;  . COLON RESECTION    . COLOSTOMY    . CORONARY ANGIOPLASTY    . CORONARY STENT PLACEMENT  06/19/2014   DES       dr Martinique  . HARDWARE REMOVAL Left 12/01/2017    Procedure: HARDWARE REMOVAL LEFT ANKLE;  Surgeon: Newt Minion, MD;  Location: [place];  Service: Orthopedics;  Laterality: Left;  . HERNIA REPAIR    . LEFT AND RIGHT HEART CATHETERIZATION WITH CORONARY ANGIOGRAM N/A 06/19/2014   Procedure: LEFT AND RIGHT HEART CATHETERIZATION WITH CORONARY ANGIOGRAM;  Surgeon: Peter M Martinique, MD;  Location: [facility] CATH LAB;  Service: Cardiovascular;  Laterality: N/A;  . ORIF ANKLE FRACTURE Left 10/25/2017   Procedure: OPEN REDUCTION INTERNAL FIXATION (ORIF) LEFT ANKLE FRACTURE;  Surgeon: Newt Minion, MD;  Location: [place];  Service: Orthopedics;  Laterality: Left;  . TEE WITHOUT CARDIOVERSION N/A 02/23/2018   Procedure: TRANSESOPHAGEAL ECHOCARDIOGRAM (TEE);  Surgeon: Dorothy Spark, MD;  Location: [facility] ENDOSCOPY;  Service: Cardiovascular;  Laterality: N/A;  . TONSILLECTOMY      Current Medications: Current Meds  Medication Sig  . acetaminophen (TYLENOL) 325 MG tablet Take 2 tablets (650 mg total) by mouth every 6 (six) hours as needed for mild pain, moderate pain or headache.  . Amino Acids-Protein Hydrolys (FEEDING SUPPLEMENT, PRO-STAT SUGAR FREE 64,) LIQD Take 30 mLs by mouth daily.  Marland Kitchen amiodarone (PACERONE) 200 MG tablet Take 2 tablets (400 mg total) by mouth as directed. BEGINNING ON 05/13/18 DECREASE TO 200 MG DAILY  . apixaban (ELIQUIS) 5 MG TABS tablet Take 1 tablet (5 mg total) by mouth 2 (two) times daily.  Marland Kitchen aspirin EC 81 MG EC tablet Take 1 tablet (81 mg total) by mouth daily.  Marland Kitchen atorvastatin (LIPITOR) 40 MG tablet Take 1 tablet (40 mg total) by mouth daily at 6 PM.  . calcium carbonate (OS-CAL) 600 MG TABS tablet Take 600 mg by mouth daily with breakfast.  . Dermatological Products, Misc. (MOISTURE BARRIER) OINT Apply 1 application topically 3 (three) times daily. To buttocks/sacrum  . furosemide (LASIX) 40 MG tablet Take 1 tablet (40 mg total) by mouth as directed. 40 mg twice daily for 3 days; then change to 60 mg daily  . Multiple Vitamin  (MULTIVITAMIN WITH MINERALS) TABS tablet Take 1 tablet by mouth daily.  Marland Kitchen omeprazole (PRILOSEC) 20 MG capsule Take 20 mg by mouth every evening.  . saxagliptin HCl (ONGLYZA) 5 MG TABS tablet Take 0.5 tablets (2.5 mg total) by mouth daily.     Allergies:   Brilinta [ticagrelor]; Clindamycin/lincomycin; Doxycycline; Penicillins; and Sulfa antibiotics   Social History   Socioeconomic History  . Marital status: Widowed    Spouse name: Not on file  . Number of children: Not on file  . Years of education: Not on file  . Highest education level: Not on file  Occupational History  . Not on file  Social Needs  . Emergency planning/management officer  strain: Not on file  . Food insecurity:    Worry: Not on file    Inability: Not on file  . Transportation needs:    Medical: Not on file    Non-medical: Not on file  Tobacco Use  . Smoking status: Never Smoker  . Smokeless tobacco: Never Used  Substance and Sexual Activity  . Alcohol use: No  . Drug use: No  . Sexual activity: Not on file  Lifestyle  . Physical activity:    Days per week: Not on file    Minutes per session: Not on file  . Stress: Not on file  Relationships  . Social connections:    Talks on phone: Not on file    Gets together: Not on file    Attends religious service: Not on file    Active member of club or organization: Not on file    Attends meetings of clubs or organizations: Not on file    Relationship status: Not on file  Other Topics Concern  . Not on file  Social History Narrative  . Not on file     Family History: The patient's family history includes Colon cancer in his mother.  ROS:   Please see the history of present illness.    ROS  All other systems reviewed and negative.   EKGs/Labs/Other Studies Reviewed:    The following studies were reviewed today: none  EKG:  EKG is not ordered today.  Recent Labs: 02/19/2018: B Natriuretic Peptide 488.1 03/27/2018: ALT 20 04/11/2018: Hemoglobin 10.6; Platelets  334 04/24/2018: BUN 40; Creatinine, Ser 1.71; Potassium 4.6; Sodium 141   Recent Lipid Panel    Component Value Date/Time   CHOL 112 03/27/2018 0547   TRIG 119 03/27/2018 0547   HDL 36 (L) 03/27/2018 0547   CHOLHDL 3.1 03/27/2018 0547   VLDL 24 03/27/2018 0547   LDLCALC 52 03/27/2018 0547    Physical Exam:    VS:  BP 132/60 (BP Location: Left Arm, Patient Position: Sitting, Cuff Size: Large)   Pulse 68   Ht 5' 10"  (1.778 m)   Wt 234 lb 12.8 oz (106.5 kg)   SpO2 98%   BMI 33.69 kg/m     Wt Readings from Last 3 Encounters:  05/01/18 234 lb 12.8 oz (106.5 kg)  04/24/18 238 lb (108 kg)  04/11/18 211 lb (95.7 kg)     GEN:  Well nourished, well developed in no acute distress HEENT: Normal NECK: No JVD; No carotid bruits LYMPHATICS: No lymphadenopathy CARDIAC: RRR, no murmurs, rubs, gallops RESPIRATORY:  Clear to auscultation without rales, wheezing or rhonchi  ABDOMEN: Soft, non-tender, non-distended MUSCULOSKELETAL:  No deformity.  Lower extremities are wrapped today but there does appear to be some degree of edema. SKIN: Warm and dry NEUROLOGIC:  Alert and oriented x 3 PSYCHIATRIC:  Normal affect   ASSESSMENT:    1. Coronary artery disease involving native coronary artery of native heart without angina pectoris   2. Persistent atrial fibrillation ([place])   3. Essential hypertension   4. DCM (dilated cardiomyopathy) ([place])   5. Chronic combined systolic and diastolic CHF (congestive heart failure) ([place])   6. Dyslipidemia    PLAN:    In order of problems listed above:  1.  ASCAD - s/p prior PCI with DES to the RI and known chronic total occlusion of the LAD.  Denies any anginal symptoms.  He will continue on aspirin 81 mg daily, statin.  2.  Persistent atrial fibrillation -several months  ago he was hospitalized with severe bradycardia secondary to hyperkalemia.  Amiodarone and beta-blockers were held and he then developed recurrent atrial fibrillation and underwent TEE  cardioversion.  He then reverted back into atrial fibrillation.  His amiodarone was increased to 400 mg daily.  EKG the following week showed that he was back in normal sinus rhythm.  He will decrease his amiodarone to 200 mg starting tomorrow.  3.  HTN -BP is well controlled on exam today.  Currently is on no antihypertensive medicines.  4.  DCM -echo 03/14/2018 showed  EF 40 to 45%.  He is not on a beta-blocker due to history of bradycardia in the setting of hyperkalemia as well as no ACE due to a history of hyperkalemia in the past.  5.  Chronic combined systolic/diastolic CHF -he appears euvolemic on exam today.  His weight remains stable from a week ago and is actually lost 4 pounds.  He has chronic lower extremity edema which is most likely more from obesity and sedentary state with legs dependent than from CHF.  He will continue on Lasix 60 mg daily now and I will check a bmet today.  If creatinine is stable after increasing dose of Lasix last week then will consider bumping up to 40 mg twice daily because of ongoing lower extremity edema.  Creatinine was stable at 1.7 on 04/24/2018 and potassium was 4.6.  Stage III and creatinine has ranged from 1.36-2.13 recently.  6.  Hyperlipidemia - LDL goal is less than 70.  His LDL was 52 on 03/27/2018 and ALT was normal at 20.  Will continue on atorvastatin 40 mg daily.   Medication Adjustments/Labs and Tests Ordered: Current medicines are reviewed at length with the patient today.  Concerns regarding medicines are outlined above.  No orders of the defined types were placed in this encounter.  No orders of the defined types were placed in this encounter.   Signed, [name], MD  05/01/2018 11:30 AM    [place]

## 2018-05-01 NOTE — Patient Instructions (Addendum)
Medication Instructions:  Your physician recommends that you continue on your current medications as directed. Please refer to the Current Medication list given to you today.  Labwork: Your physician recommends that you have lab work today BMET  Testing/Procedures: NONE  Follow-Up: Your physician wants you to follow-up in: 3 months with PA.  Your physician wants you to follow-up in: 6 month with Dr. Radford Pax. You will receive a reminder letter in the mail two months in advance. If you don't receive a letter, please call our office to schedule the follow-up appointment.    If you need a refill on your cardiac medications before your next appointment, please call your pharmacy.

## 2018-05-02 ENCOUNTER — Telehealth: Payer: Self-pay | Admitting: *Deleted

## 2018-05-02 DIAGNOSIS — I5042 Chronic combined systolic (congestive) and diastolic (congestive) heart failure: Secondary | ICD-10-CM

## 2018-05-02 DIAGNOSIS — I5043 Acute on chronic combined systolic (congestive) and diastolic (congestive) heart failure: Secondary | ICD-10-CM | POA: Diagnosis not present

## 2018-05-02 DIAGNOSIS — I2510 Atherosclerotic heart disease of native coronary artery without angina pectoris: Secondary | ICD-10-CM | POA: Diagnosis not present

## 2018-05-02 DIAGNOSIS — E1122 Type 2 diabetes mellitus with diabetic chronic kidney disease: Secondary | ICD-10-CM | POA: Diagnosis not present

## 2018-05-02 DIAGNOSIS — S82852D Displaced trimalleolar fracture of left lower leg, subsequent encounter for closed fracture with routine healing: Secondary | ICD-10-CM | POA: Diagnosis not present

## 2018-05-02 DIAGNOSIS — E1142 Type 2 diabetes mellitus with diabetic polyneuropathy: Secondary | ICD-10-CM | POA: Diagnosis not present

## 2018-05-02 DIAGNOSIS — I130 Hypertensive heart and chronic kidney disease with heart failure and stage 1 through stage 4 chronic kidney disease, or unspecified chronic kidney disease: Secondary | ICD-10-CM | POA: Diagnosis not present

## 2018-05-02 MED ORDER — FUROSEMIDE 40 MG PO TABS: 40.0000 mg | ORAL_TABLET | Freq: Two times a day (BID) | ORAL | 3 refills | Status: DC

## 2018-05-02 NOTE — Telephone Encounter (Signed)
Spoke with pt and went over results and recommendations per [name], PA-C. Pt asked that I call [facility] and advise them of orders as well. He said lab would need to be scheduled with them as they provide transportation. Spoke with Rachel Moulds at [facility] and went over results and recommendations. She asked that I fax these to [phone]. She states that transportation has left for the day and she will send an email to make them aware to schedule labs. Orders faxed.

## 2018-05-02 NOTE — Telephone Encounter (Signed)
-----   Message from Liliane Shi, PA-C sent at 05/02/2018  6:50 AM EDT ----- Renal function stable.   PLAN:  1. Increase Lasix to 40 mg Twice daily 2. BMET 1 week [name], PA-C    05/02/2018 6:49 AM

## 2018-05-03 ENCOUNTER — Telehealth: Payer: Self-pay | Admitting: Cardiology

## 2018-05-03 NOTE — Telephone Encounter (Signed)
Faxed over the pts lab results with orders attached above the result, by [name] PA-C.  [facility] [place] states that is acceptable to take as a faxed order, on pts recent lab results and changes made to his lasix.  Faxed this to [place] with [facility] at [phone].  Shawna verbalized understanding and agrees with this plan.

## 2018-05-03 NOTE — Telephone Encounter (Signed)
New Message      [facility] @ [place] needs an order sent over for the change in Lasix. What she rec'vd was labs, but she needs the order change for lasix.Pls fax information, "Thx,"

## 2018-05-04 DIAGNOSIS — S82852D Displaced trimalleolar fracture of left lower leg, subsequent encounter for closed fracture with routine healing: Secondary | ICD-10-CM | POA: Diagnosis not present

## 2018-05-04 DIAGNOSIS — E1122 Type 2 diabetes mellitus with diabetic chronic kidney disease: Secondary | ICD-10-CM | POA: Diagnosis not present

## 2018-05-04 DIAGNOSIS — E1142 Type 2 diabetes mellitus with diabetic polyneuropathy: Secondary | ICD-10-CM | POA: Diagnosis not present

## 2018-05-04 DIAGNOSIS — I2510 Atherosclerotic heart disease of native coronary artery without angina pectoris: Secondary | ICD-10-CM | POA: Diagnosis not present

## 2018-05-04 DIAGNOSIS — I5043 Acute on chronic combined systolic (congestive) and diastolic (congestive) heart failure: Secondary | ICD-10-CM | POA: Diagnosis not present

## 2018-05-04 DIAGNOSIS — I130 Hypertensive heart and chronic kidney disease with heart failure and stage 1 through stage 4 chronic kidney disease, or unspecified chronic kidney disease: Secondary | ICD-10-CM | POA: Diagnosis not present

## 2018-05-07 DIAGNOSIS — E1122 Type 2 diabetes mellitus with diabetic chronic kidney disease: Secondary | ICD-10-CM | POA: Diagnosis not present

## 2018-05-07 DIAGNOSIS — I5043 Acute on chronic combined systolic (congestive) and diastolic (congestive) heart failure: Secondary | ICD-10-CM | POA: Diagnosis not present

## 2018-05-07 DIAGNOSIS — I130 Hypertensive heart and chronic kidney disease with heart failure and stage 1 through stage 4 chronic kidney disease, or unspecified chronic kidney disease: Secondary | ICD-10-CM | POA: Diagnosis not present

## 2018-05-07 DIAGNOSIS — E1142 Type 2 diabetes mellitus with diabetic polyneuropathy: Secondary | ICD-10-CM | POA: Diagnosis not present

## 2018-05-07 DIAGNOSIS — I2510 Atherosclerotic heart disease of native coronary artery without angina pectoris: Secondary | ICD-10-CM | POA: Diagnosis not present

## 2018-05-07 DIAGNOSIS — S82852D Displaced trimalleolar fracture of left lower leg, subsequent encounter for closed fracture with routine healing: Secondary | ICD-10-CM | POA: Diagnosis not present

## 2018-05-09 DIAGNOSIS — I130 Hypertensive heart and chronic kidney disease with heart failure and stage 1 through stage 4 chronic kidney disease, or unspecified chronic kidney disease: Secondary | ICD-10-CM | POA: Diagnosis not present

## 2018-05-09 DIAGNOSIS — I2510 Atherosclerotic heart disease of native coronary artery without angina pectoris: Secondary | ICD-10-CM | POA: Diagnosis not present

## 2018-05-09 DIAGNOSIS — S82852D Displaced trimalleolar fracture of left lower leg, subsequent encounter for closed fracture with routine healing: Secondary | ICD-10-CM | POA: Diagnosis not present

## 2018-05-09 DIAGNOSIS — E1142 Type 2 diabetes mellitus with diabetic polyneuropathy: Secondary | ICD-10-CM | POA: Diagnosis not present

## 2018-05-09 DIAGNOSIS — I5043 Acute on chronic combined systolic (congestive) and diastolic (congestive) heart failure: Secondary | ICD-10-CM | POA: Diagnosis not present

## 2018-05-09 DIAGNOSIS — E1122 Type 2 diabetes mellitus with diabetic chronic kidney disease: Secondary | ICD-10-CM | POA: Diagnosis not present

## 2018-05-10 ENCOUNTER — Other Ambulatory Visit: Payer: Medicare Other | Admitting: *Deleted

## 2018-05-10 DIAGNOSIS — I5042 Chronic combined systolic (congestive) and diastolic (congestive) heart failure: Secondary | ICD-10-CM | POA: Diagnosis not present

## 2018-05-11 ENCOUNTER — Telehealth: Payer: Self-pay | Admitting: *Deleted

## 2018-05-11 DIAGNOSIS — I2510 Atherosclerotic heart disease of native coronary artery without angina pectoris: Secondary | ICD-10-CM | POA: Diagnosis not present

## 2018-05-11 DIAGNOSIS — I5043 Acute on chronic combined systolic (congestive) and diastolic (congestive) heart failure: Secondary | ICD-10-CM | POA: Diagnosis not present

## 2018-05-11 DIAGNOSIS — S82852D Displaced trimalleolar fracture of left lower leg, subsequent encounter for closed fracture with routine healing: Secondary | ICD-10-CM | POA: Diagnosis not present

## 2018-05-11 DIAGNOSIS — E1122 Type 2 diabetes mellitus with diabetic chronic kidney disease: Secondary | ICD-10-CM | POA: Diagnosis not present

## 2018-05-11 DIAGNOSIS — I130 Hypertensive heart and chronic kidney disease with heart failure and stage 1 through stage 4 chronic kidney disease, or unspecified chronic kidney disease: Secondary | ICD-10-CM | POA: Diagnosis not present

## 2018-05-11 DIAGNOSIS — E1142 Type 2 diabetes mellitus with diabetic polyneuropathy: Secondary | ICD-10-CM | POA: Diagnosis not present

## 2018-05-11 LAB — BASIC METABOLIC PANEL
BUN/Creatinine Ratio: 24 (ref 10–24)
BUN: 43 mg/dL — ABNORMAL HIGH (ref 8–27)
CO2: 21 mmol/L (ref 20–29)
Calcium: 8 mg/dL — ABNORMAL LOW (ref 8.6–10.2)
Chloride: 103 mmol/L (ref 96–106)
Creatinine, Ser: 1.8 mg/dL — ABNORMAL HIGH (ref 0.76–1.27)
GFR calc Af Amer: 39 mL/min/{1.73_m2} — ABNORMAL LOW (ref >59)
GFR calc non Af Amer: 34 mL/min/{1.73_m2} — ABNORMAL LOW (ref >59)
Glucose: 186 mg/dL — ABNORMAL HIGH (ref 65–99)
Potassium: 4.6 mmol/L (ref 3.5–5.2)
Sodium: 139 mmol/L (ref 134–144)

## 2018-05-11 MED ORDER — APIXABAN 2.5 MG PO TABS: 2.5000 mg | ORAL_TABLET | Freq: Two times a day (BID) | ORAL | Status: DC

## 2018-05-11 NOTE — Telephone Encounter (Signed)
-----   Message from Liliane Shi, [place] sent at 05/11/2018  2:02 PM EDT ----- The following labs are stable without significant clinical change: Creatinine;  Potassium is normal.  Glucose is elevated.  All other results are normal or within acceptable limits. Medication changes / Follow up labs / Other changes or recommendations:   - Based upon his renal function, his dose of Eliquis should be changed (age > 85, SCr > 1.5). - Decrease Eliquis to 2.5 mg twice daily - Otherwise continue all other current medications. - Follow-up as planned. [name] Dopp, PA-C 05/11/2018 2:00 PM

## 2018-05-11 NOTE — Telephone Encounter (Signed)
I s/w Seth Bake, RN at [facility]. I went over lab results and recommendation to decrease Eliquis based on creatinine. I will fax results to Seth Bake at [phone]. Seth Bake, RN verbalized understanding to results given.

## 2018-05-15 DIAGNOSIS — E1142 Type 2 diabetes mellitus with diabetic polyneuropathy: Secondary | ICD-10-CM | POA: Diagnosis not present

## 2018-05-15 DIAGNOSIS — E1122 Type 2 diabetes mellitus with diabetic chronic kidney disease: Secondary | ICD-10-CM | POA: Diagnosis not present

## 2018-05-15 DIAGNOSIS — I130 Hypertensive heart and chronic kidney disease with heart failure and stage 1 through stage 4 chronic kidney disease, or unspecified chronic kidney disease: Secondary | ICD-10-CM | POA: Diagnosis not present

## 2018-05-15 DIAGNOSIS — I2510 Atherosclerotic heart disease of native coronary artery without angina pectoris: Secondary | ICD-10-CM | POA: Diagnosis not present

## 2018-05-15 DIAGNOSIS — S82852D Displaced trimalleolar fracture of left lower leg, subsequent encounter for closed fracture with routine healing: Secondary | ICD-10-CM | POA: Diagnosis not present

## 2018-05-15 DIAGNOSIS — I5043 Acute on chronic combined systolic (congestive) and diastolic (congestive) heart failure: Secondary | ICD-10-CM | POA: Diagnosis not present

## 2018-05-17 DIAGNOSIS — E1142 Type 2 diabetes mellitus with diabetic polyneuropathy: Secondary | ICD-10-CM | POA: Diagnosis not present

## 2018-05-17 DIAGNOSIS — S82852D Displaced trimalleolar fracture of left lower leg, subsequent encounter for closed fracture with routine healing: Secondary | ICD-10-CM | POA: Diagnosis not present

## 2018-05-17 DIAGNOSIS — E1122 Type 2 diabetes mellitus with diabetic chronic kidney disease: Secondary | ICD-10-CM | POA: Diagnosis not present

## 2018-05-17 DIAGNOSIS — I5043 Acute on chronic combined systolic (congestive) and diastolic (congestive) heart failure: Secondary | ICD-10-CM | POA: Diagnosis not present

## 2018-05-17 DIAGNOSIS — I2510 Atherosclerotic heart disease of native coronary artery without angina pectoris: Secondary | ICD-10-CM | POA: Diagnosis not present

## 2018-05-17 DIAGNOSIS — I130 Hypertensive heart and chronic kidney disease with heart failure and stage 1 through stage 4 chronic kidney disease, or unspecified chronic kidney disease: Secondary | ICD-10-CM | POA: Diagnosis not present

## 2018-05-20 DIAGNOSIS — Z933 Colostomy status: Secondary | ICD-10-CM | POA: Diagnosis not present

## 2018-05-20 DIAGNOSIS — I5042 Chronic combined systolic (congestive) and diastolic (congestive) heart failure: Secondary | ICD-10-CM | POA: Diagnosis not present

## 2018-05-20 DIAGNOSIS — S32512D Fracture of superior rim of left pubis, subsequent encounter for fracture with routine healing: Secondary | ICD-10-CM | POA: Diagnosis not present

## 2018-05-20 DIAGNOSIS — Z7901 Long term (current) use of anticoagulants: Secondary | ICD-10-CM | POA: Diagnosis not present

## 2018-05-20 DIAGNOSIS — D631 Anemia in chronic kidney disease: Secondary | ICD-10-CM | POA: Diagnosis not present

## 2018-05-20 DIAGNOSIS — S32592D Other specified fracture of left pubis, subsequent encounter for fracture with routine healing: Secondary | ICD-10-CM | POA: Diagnosis not present

## 2018-05-20 DIAGNOSIS — E1142 Type 2 diabetes mellitus with diabetic polyneuropathy: Secondary | ICD-10-CM | POA: Diagnosis not present

## 2018-05-20 DIAGNOSIS — E1122 Type 2 diabetes mellitus with diabetic chronic kidney disease: Secondary | ICD-10-CM | POA: Diagnosis not present

## 2018-05-20 DIAGNOSIS — Z79891 Long term (current) use of opiate analgesic: Secondary | ICD-10-CM | POA: Diagnosis not present

## 2018-05-20 DIAGNOSIS — N183 Chronic kidney disease, stage 3 (moderate): Secondary | ICD-10-CM | POA: Diagnosis not present

## 2018-05-20 DIAGNOSIS — M87052 Idiopathic aseptic necrosis of left femur: Secondary | ICD-10-CM | POA: Diagnosis not present

## 2018-05-20 DIAGNOSIS — I480 Paroxysmal atrial fibrillation: Secondary | ICD-10-CM | POA: Diagnosis not present

## 2018-05-20 DIAGNOSIS — Z9181 History of falling: Secondary | ICD-10-CM | POA: Diagnosis not present

## 2018-05-20 DIAGNOSIS — M87051 Idiopathic aseptic necrosis of right femur: Secondary | ICD-10-CM | POA: Diagnosis not present

## 2018-05-20 DIAGNOSIS — K5190 Ulcerative colitis, unspecified, without complications: Secondary | ICD-10-CM | POA: Diagnosis not present

## 2018-05-20 DIAGNOSIS — W19XXXD Unspecified fall, subsequent encounter: Secondary | ICD-10-CM | POA: Diagnosis not present

## 2018-05-20 DIAGNOSIS — Z8781 Personal history of (healed) traumatic fracture: Secondary | ICD-10-CM | POA: Diagnosis not present

## 2018-05-20 DIAGNOSIS — N401 Enlarged prostate with lower urinary tract symptoms: Secondary | ICD-10-CM | POA: Diagnosis not present

## 2018-05-20 DIAGNOSIS — R339 Retention of urine, unspecified: Secondary | ICD-10-CM | POA: Diagnosis not present

## 2018-05-20 DIAGNOSIS — I2510 Atherosclerotic heart disease of native coronary artery without angina pectoris: Secondary | ICD-10-CM | POA: Diagnosis not present

## 2018-05-20 DIAGNOSIS — I130 Hypertensive heart and chronic kidney disease with heart failure and stage 1 through stage 4 chronic kidney disease, or unspecified chronic kidney disease: Secondary | ICD-10-CM | POA: Diagnosis not present

## 2018-05-21 DIAGNOSIS — S32512D Fracture of superior rim of left pubis, subsequent encounter for fracture with routine healing: Secondary | ICD-10-CM | POA: Diagnosis not present

## 2018-05-21 DIAGNOSIS — E1142 Type 2 diabetes mellitus with diabetic polyneuropathy: Secondary | ICD-10-CM | POA: Diagnosis not present

## 2018-05-21 DIAGNOSIS — M87051 Idiopathic aseptic necrosis of right femur: Secondary | ICD-10-CM | POA: Diagnosis not present

## 2018-05-21 DIAGNOSIS — S32592D Other specified fracture of left pubis, subsequent encounter for fracture with routine healing: Secondary | ICD-10-CM | POA: Diagnosis not present

## 2018-05-21 DIAGNOSIS — M87052 Idiopathic aseptic necrosis of left femur: Secondary | ICD-10-CM | POA: Diagnosis not present

## 2018-05-21 DIAGNOSIS — W19XXXD Unspecified fall, subsequent encounter: Secondary | ICD-10-CM | POA: Diagnosis not present

## 2018-05-23 DIAGNOSIS — M87051 Idiopathic aseptic necrosis of right femur: Secondary | ICD-10-CM | POA: Diagnosis not present

## 2018-05-23 DIAGNOSIS — E1142 Type 2 diabetes mellitus with diabetic polyneuropathy: Secondary | ICD-10-CM | POA: Diagnosis not present

## 2018-05-23 DIAGNOSIS — M87052 Idiopathic aseptic necrosis of left femur: Secondary | ICD-10-CM | POA: Diagnosis not present

## 2018-05-23 DIAGNOSIS — W19XXXD Unspecified fall, subsequent encounter: Secondary | ICD-10-CM | POA: Diagnosis not present

## 2018-05-23 DIAGNOSIS — S32592D Other specified fracture of left pubis, subsequent encounter for fracture with routine healing: Secondary | ICD-10-CM | POA: Diagnosis not present

## 2018-05-23 DIAGNOSIS — S32512D Fracture of superior rim of left pubis, subsequent encounter for fracture with routine healing: Secondary | ICD-10-CM | POA: Diagnosis not present

## 2018-05-28 DIAGNOSIS — M87052 Idiopathic aseptic necrosis of left femur: Secondary | ICD-10-CM | POA: Diagnosis not present

## 2018-05-28 DIAGNOSIS — W19XXXD Unspecified fall, subsequent encounter: Secondary | ICD-10-CM | POA: Diagnosis not present

## 2018-05-28 DIAGNOSIS — M87051 Idiopathic aseptic necrosis of right femur: Secondary | ICD-10-CM | POA: Diagnosis not present

## 2018-05-28 DIAGNOSIS — E1142 Type 2 diabetes mellitus with diabetic polyneuropathy: Secondary | ICD-10-CM | POA: Diagnosis not present

## 2018-05-28 DIAGNOSIS — S32512D Fracture of superior rim of left pubis, subsequent encounter for fracture with routine healing: Secondary | ICD-10-CM | POA: Diagnosis not present

## 2018-05-28 DIAGNOSIS — S32592D Other specified fracture of left pubis, subsequent encounter for fracture with routine healing: Secondary | ICD-10-CM | POA: Diagnosis not present

## 2018-05-30 DIAGNOSIS — S32512D Fracture of superior rim of left pubis, subsequent encounter for fracture with routine healing: Secondary | ICD-10-CM | POA: Diagnosis not present

## 2018-05-30 DIAGNOSIS — E1142 Type 2 diabetes mellitus with diabetic polyneuropathy: Secondary | ICD-10-CM | POA: Diagnosis not present

## 2018-05-30 DIAGNOSIS — W19XXXD Unspecified fall, subsequent encounter: Secondary | ICD-10-CM | POA: Diagnosis not present

## 2018-05-30 DIAGNOSIS — M87052 Idiopathic aseptic necrosis of left femur: Secondary | ICD-10-CM | POA: Diagnosis not present

## 2018-05-30 DIAGNOSIS — M87051 Idiopathic aseptic necrosis of right femur: Secondary | ICD-10-CM | POA: Diagnosis not present

## 2018-05-30 DIAGNOSIS — S32592D Other specified fracture of left pubis, subsequent encounter for fracture with routine healing: Secondary | ICD-10-CM | POA: Diagnosis not present

## 2018-06-04 DIAGNOSIS — S32512D Fracture of superior rim of left pubis, subsequent encounter for fracture with routine healing: Secondary | ICD-10-CM | POA: Diagnosis not present

## 2018-06-04 DIAGNOSIS — S32592D Other specified fracture of left pubis, subsequent encounter for fracture with routine healing: Secondary | ICD-10-CM | POA: Diagnosis not present

## 2018-06-04 DIAGNOSIS — W19XXXD Unspecified fall, subsequent encounter: Secondary | ICD-10-CM | POA: Diagnosis not present

## 2018-06-04 DIAGNOSIS — E1142 Type 2 diabetes mellitus with diabetic polyneuropathy: Secondary | ICD-10-CM | POA: Diagnosis not present

## 2018-06-04 DIAGNOSIS — M87051 Idiopathic aseptic necrosis of right femur: Secondary | ICD-10-CM | POA: Diagnosis not present

## 2018-06-04 DIAGNOSIS — M87052 Idiopathic aseptic necrosis of left femur: Secondary | ICD-10-CM | POA: Diagnosis not present

## 2018-06-06 ENCOUNTER — Ambulatory Visit: Payer: Medicare Other | Admitting: Cardiology

## 2018-06-07 DIAGNOSIS — S32512D Fracture of superior rim of left pubis, subsequent encounter for fracture with routine healing: Secondary | ICD-10-CM | POA: Diagnosis not present

## 2018-06-07 DIAGNOSIS — S32592D Other specified fracture of left pubis, subsequent encounter for fracture with routine healing: Secondary | ICD-10-CM | POA: Diagnosis not present

## 2018-06-07 DIAGNOSIS — M87052 Idiopathic aseptic necrosis of left femur: Secondary | ICD-10-CM | POA: Diagnosis not present

## 2018-06-07 DIAGNOSIS — M87051 Idiopathic aseptic necrosis of right femur: Secondary | ICD-10-CM | POA: Diagnosis not present

## 2018-06-07 DIAGNOSIS — E1142 Type 2 diabetes mellitus with diabetic polyneuropathy: Secondary | ICD-10-CM | POA: Diagnosis not present

## 2018-06-07 DIAGNOSIS — W19XXXD Unspecified fall, subsequent encounter: Secondary | ICD-10-CM | POA: Diagnosis not present

## 2018-06-11 DIAGNOSIS — M87051 Idiopathic aseptic necrosis of right femur: Secondary | ICD-10-CM | POA: Diagnosis not present

## 2018-06-11 DIAGNOSIS — M87052 Idiopathic aseptic necrosis of left femur: Secondary | ICD-10-CM | POA: Diagnosis not present

## 2018-06-11 DIAGNOSIS — S32592D Other specified fracture of left pubis, subsequent encounter for fracture with routine healing: Secondary | ICD-10-CM | POA: Diagnosis not present

## 2018-06-11 DIAGNOSIS — W19XXXD Unspecified fall, subsequent encounter: Secondary | ICD-10-CM | POA: Diagnosis not present

## 2018-06-11 DIAGNOSIS — S32512D Fracture of superior rim of left pubis, subsequent encounter for fracture with routine healing: Secondary | ICD-10-CM | POA: Diagnosis not present

## 2018-06-11 DIAGNOSIS — E1142 Type 2 diabetes mellitus with diabetic polyneuropathy: Secondary | ICD-10-CM | POA: Diagnosis not present

## 2018-06-13 DIAGNOSIS — S32512D Fracture of superior rim of left pubis, subsequent encounter for fracture with routine healing: Secondary | ICD-10-CM | POA: Diagnosis not present

## 2018-06-13 DIAGNOSIS — E1142 Type 2 diabetes mellitus with diabetic polyneuropathy: Secondary | ICD-10-CM | POA: Diagnosis not present

## 2018-06-13 DIAGNOSIS — M87051 Idiopathic aseptic necrosis of right femur: Secondary | ICD-10-CM | POA: Diagnosis not present

## 2018-06-13 DIAGNOSIS — S32592D Other specified fracture of left pubis, subsequent encounter for fracture with routine healing: Secondary | ICD-10-CM | POA: Diagnosis not present

## 2018-06-13 DIAGNOSIS — W19XXXD Unspecified fall, subsequent encounter: Secondary | ICD-10-CM | POA: Diagnosis not present

## 2018-06-13 DIAGNOSIS — M87052 Idiopathic aseptic necrosis of left femur: Secondary | ICD-10-CM | POA: Diagnosis not present

## 2018-06-19 DIAGNOSIS — M87051 Idiopathic aseptic necrosis of right femur: Secondary | ICD-10-CM | POA: Diagnosis not present

## 2018-06-19 DIAGNOSIS — M87052 Idiopathic aseptic necrosis of left femur: Secondary | ICD-10-CM | POA: Diagnosis not present

## 2018-06-19 DIAGNOSIS — E1142 Type 2 diabetes mellitus with diabetic polyneuropathy: Secondary | ICD-10-CM | POA: Diagnosis not present

## 2018-06-19 DIAGNOSIS — S32592D Other specified fracture of left pubis, subsequent encounter for fracture with routine healing: Secondary | ICD-10-CM | POA: Diagnosis not present

## 2018-06-19 DIAGNOSIS — W19XXXD Unspecified fall, subsequent encounter: Secondary | ICD-10-CM | POA: Diagnosis not present

## 2018-06-19 DIAGNOSIS — S32512D Fracture of superior rim of left pubis, subsequent encounter for fracture with routine healing: Secondary | ICD-10-CM | POA: Diagnosis not present

## 2018-06-21 DIAGNOSIS — Q845 Enlarged and hypertrophic nails: Secondary | ICD-10-CM | POA: Diagnosis not present

## 2018-06-21 DIAGNOSIS — E1142 Type 2 diabetes mellitus with diabetic polyneuropathy: Secondary | ICD-10-CM | POA: Diagnosis not present

## 2018-06-21 DIAGNOSIS — W19XXXD Unspecified fall, subsequent encounter: Secondary | ICD-10-CM | POA: Diagnosis not present

## 2018-06-21 DIAGNOSIS — I739 Peripheral vascular disease, unspecified: Secondary | ICD-10-CM | POA: Diagnosis not present

## 2018-06-21 DIAGNOSIS — M87051 Idiopathic aseptic necrosis of right femur: Secondary | ICD-10-CM | POA: Diagnosis not present

## 2018-06-21 DIAGNOSIS — S32512D Fracture of superior rim of left pubis, subsequent encounter for fracture with routine healing: Secondary | ICD-10-CM | POA: Diagnosis not present

## 2018-06-21 DIAGNOSIS — M87052 Idiopathic aseptic necrosis of left femur: Secondary | ICD-10-CM | POA: Diagnosis not present

## 2018-06-21 DIAGNOSIS — S32592D Other specified fracture of left pubis, subsequent encounter for fracture with routine healing: Secondary | ICD-10-CM | POA: Diagnosis not present

## 2018-06-21 DIAGNOSIS — L603 Nail dystrophy: Secondary | ICD-10-CM | POA: Diagnosis not present

## 2018-06-22 DIAGNOSIS — F4323 Adjustment disorder with mixed anxiety and depressed mood: Secondary | ICD-10-CM | POA: Diagnosis not present

## 2018-06-26 DIAGNOSIS — M87051 Idiopathic aseptic necrosis of right femur: Secondary | ICD-10-CM | POA: Diagnosis not present

## 2018-06-26 DIAGNOSIS — W19XXXD Unspecified fall, subsequent encounter: Secondary | ICD-10-CM | POA: Diagnosis not present

## 2018-06-26 DIAGNOSIS — S32512D Fracture of superior rim of left pubis, subsequent encounter for fracture with routine healing: Secondary | ICD-10-CM | POA: Diagnosis not present

## 2018-06-26 DIAGNOSIS — E1142 Type 2 diabetes mellitus with diabetic polyneuropathy: Secondary | ICD-10-CM | POA: Diagnosis not present

## 2018-06-26 DIAGNOSIS — M87052 Idiopathic aseptic necrosis of left femur: Secondary | ICD-10-CM | POA: Diagnosis not present

## 2018-06-26 DIAGNOSIS — S32592D Other specified fracture of left pubis, subsequent encounter for fracture with routine healing: Secondary | ICD-10-CM | POA: Diagnosis not present

## 2018-06-28 DIAGNOSIS — M87052 Idiopathic aseptic necrosis of left femur: Secondary | ICD-10-CM | POA: Diagnosis not present

## 2018-06-28 DIAGNOSIS — S32592D Other specified fracture of left pubis, subsequent encounter for fracture with routine healing: Secondary | ICD-10-CM | POA: Diagnosis not present

## 2018-06-28 DIAGNOSIS — S32512D Fracture of superior rim of left pubis, subsequent encounter for fracture with routine healing: Secondary | ICD-10-CM | POA: Diagnosis not present

## 2018-06-28 DIAGNOSIS — E1142 Type 2 diabetes mellitus with diabetic polyneuropathy: Secondary | ICD-10-CM | POA: Diagnosis not present

## 2018-06-28 DIAGNOSIS — M87051 Idiopathic aseptic necrosis of right femur: Secondary | ICD-10-CM | POA: Diagnosis not present

## 2018-06-28 DIAGNOSIS — W19XXXD Unspecified fall, subsequent encounter: Secondary | ICD-10-CM | POA: Diagnosis not present

## 2018-07-03 DIAGNOSIS — M87051 Idiopathic aseptic necrosis of right femur: Secondary | ICD-10-CM | POA: Diagnosis not present

## 2018-07-03 DIAGNOSIS — M87052 Idiopathic aseptic necrosis of left femur: Secondary | ICD-10-CM | POA: Diagnosis not present

## 2018-07-03 DIAGNOSIS — W19XXXD Unspecified fall, subsequent encounter: Secondary | ICD-10-CM | POA: Diagnosis not present

## 2018-07-03 DIAGNOSIS — E1142 Type 2 diabetes mellitus with diabetic polyneuropathy: Secondary | ICD-10-CM | POA: Diagnosis not present

## 2018-07-03 DIAGNOSIS — S32592D Other specified fracture of left pubis, subsequent encounter for fracture with routine healing: Secondary | ICD-10-CM | POA: Diagnosis not present

## 2018-07-03 DIAGNOSIS — S32512D Fracture of superior rim of left pubis, subsequent encounter for fracture with routine healing: Secondary | ICD-10-CM | POA: Diagnosis not present

## 2018-07-05 DIAGNOSIS — S32512D Fracture of superior rim of left pubis, subsequent encounter for fracture with routine healing: Secondary | ICD-10-CM | POA: Diagnosis not present

## 2018-07-05 DIAGNOSIS — E1142 Type 2 diabetes mellitus with diabetic polyneuropathy: Secondary | ICD-10-CM | POA: Diagnosis not present

## 2018-07-05 DIAGNOSIS — M87052 Idiopathic aseptic necrosis of left femur: Secondary | ICD-10-CM | POA: Diagnosis not present

## 2018-07-05 DIAGNOSIS — S32592D Other specified fracture of left pubis, subsequent encounter for fracture with routine healing: Secondary | ICD-10-CM | POA: Diagnosis not present

## 2018-07-05 DIAGNOSIS — W19XXXD Unspecified fall, subsequent encounter: Secondary | ICD-10-CM | POA: Diagnosis not present

## 2018-07-05 DIAGNOSIS — M87051 Idiopathic aseptic necrosis of right femur: Secondary | ICD-10-CM | POA: Diagnosis not present

## 2018-07-16 ENCOUNTER — Ambulatory Visit (INDEPENDENT_AMBULATORY_CARE_PROVIDER_SITE_OTHER): Payer: Medicare Other | Admitting: Cardiology

## 2018-07-16 ENCOUNTER — Encounter: Payer: Self-pay | Admitting: Cardiology

## 2018-07-16 VITALS — BP 112/58 | HR 86 | Ht 70.0 in | Wt 231.4 lb

## 2018-07-16 DIAGNOSIS — I480 Paroxysmal atrial fibrillation: Secondary | ICD-10-CM | POA: Diagnosis not present

## 2018-07-16 DIAGNOSIS — I10 Essential (primary) hypertension: Secondary | ICD-10-CM | POA: Diagnosis not present

## 2018-07-16 DIAGNOSIS — I2510 Atherosclerotic heart disease of native coronary artery without angina pectoris: Secondary | ICD-10-CM | POA: Diagnosis not present

## 2018-07-16 DIAGNOSIS — Z23 Encounter for immunization: Secondary | ICD-10-CM | POA: Diagnosis not present

## 2018-07-16 DIAGNOSIS — I5042 Chronic combined systolic (congestive) and diastolic (congestive) heart failure: Secondary | ICD-10-CM

## 2018-07-16 DIAGNOSIS — I5030 Unspecified diastolic (congestive) heart failure: Secondary | ICD-10-CM | POA: Diagnosis not present

## 2018-07-16 DIAGNOSIS — I481 Persistent atrial fibrillation: Secondary | ICD-10-CM | POA: Diagnosis not present

## 2018-07-16 DIAGNOSIS — E1122 Type 2 diabetes mellitus with diabetic chronic kidney disease: Secondary | ICD-10-CM | POA: Diagnosis not present

## 2018-07-16 DIAGNOSIS — I872 Venous insufficiency (chronic) (peripheral): Secondary | ICD-10-CM | POA: Diagnosis not present

## 2018-07-16 DIAGNOSIS — I700 Atherosclerosis of aorta: Secondary | ICD-10-CM | POA: Diagnosis not present

## 2018-07-16 DIAGNOSIS — N183 Chronic kidney disease, stage 3 (moderate): Secondary | ICD-10-CM | POA: Diagnosis not present

## 2018-07-16 NOTE — Progress Notes (Signed)
07/16/2018 [name] [name]   13-Oct-1933  [id number]  Primary Physician [name], MD Primary Cardiologist: Dr. Radford Pax   Reason for Visit/CC: f/u for atrial fibrillation and chronic LEE  HPI: [name] [name] is a 84 y.o. male with a hx of coronary artery diseases/p prior PCI with DES to the RI and known chronic total occlusion of the LAD, paroxysmalatrial fibrillation, diastolic CHF, atrial flutter s/p ablation, chronic kidney disease,diabetes,hypertension,Ulcerative Colitiss/p colectomy. He had previously  not been on anticoagulationdue to a history of GI bleeding. He was admitted in 02/2018 with bradycardia in the setting of profound hyperkalemia. His HR recovered with reversal of the hyperkalemia but he developed AFib with RVR while his Amiodarone and beta-blockerwere on hold. He was placed on Apixaban and underwent TEE guided DCCV. His EF was 35-40%. Follow up echoin May 2019demonstrated EF 40-45% and PASP 44. He was admitted again in June 2019 with a pelvic fracture after a fall. He followed up here 04/11/2018. He was back in atrial fibrillation. Amiodarone was increased to 400 mg daily. He had f/u with Dr. Radford Pax 05/01/18 and was noted to be back in NSR. She subsequently reduced his amiodarone dose back down to 200 mg daily. He was continued on Eliquis, low dose 2.5 mg BID due to age and renal function.   He is here again for f/u. He is here with his sister. EKG today shows NSR w/ PACs.  Denies palpitations.  No chest pain.  He denies resting dyspnea and no orthopnea or PND but he does have some chronic exertional dyspnea.  He continues to reside at skilled nursing facility.  He continues to have bilateral lower extremity edema.  Compression stockings have been recommended by multiple providers however it is very difficult for him to get compression stockings on. He does not elevate his legs much.  He does not add salt to his food but he is unsure if he is being provided with a  low-sodium diet at nursing facility.  He denies any abnormal bleeding.  No melena or hematochezia.   Current Meds  Medication Sig  . acetaminophen (TYLENOL) 325 MG tablet Take 2 tablets (650 mg total) by mouth every 6 (six) hours as needed for mild pain, moderate pain or headache.  . Amino Acids-Protein Hydrolys (FEEDING SUPPLEMENT, PRO-STAT SUGAR FREE 64,) LIQD Take 30 mLs by mouth daily.  Marland Kitchen amiodarone (PACERONE) 200 MG tablet Take 2 tablets (400 mg total) by mouth as directed. BEGINNING ON 05/13/18 DECREASE TO 200 MG DAILY  . apixaban (ELIQUIS) 2.5 MG TABS tablet Take 1 tablet (2.5 mg total) by mouth 2 (two) times daily.  Marland Kitchen aspirin EC 81 MG EC tablet Take 1 tablet (81 mg total) by mouth daily.  Marland Kitchen atorvastatin (LIPITOR) 40 MG tablet Take 1 tablet (40 mg total) by mouth daily at 6 PM.  . calcium carbonate (OS-CAL) 600 MG TABS tablet Take 600 mg by mouth daily with breakfast.  . Dermatological Products, Misc. (MOISTURE BARRIER) OINT Apply 1 application topically 3 (three) times daily. To buttocks/sacrum  . furosemide (LASIX) 40 MG tablet Take 1 tablet (40 mg total) by mouth 2 (two) times daily.  Marland Kitchen glimepiride (AMARYL) 1 MG tablet Take 1 mg by mouth 3 times daily with meals, bedtime and 2 AM.  . Multiple Vitamin (MULTIVITAMIN WITH MINERALS) TABS tablet Take 1 tablet by mouth daily.  Marland Kitchen omeprazole (PRILOSEC) 20 MG capsule Take 20 mg by mouth every evening.  . saxagliptin HCl (ONGLYZA) 5 MG TABS tablet Take  0.5 tablets (2.5 mg total) by mouth daily.   Allergies  Allergen Reactions  . Brilinta [Ticagrelor] Shortness Of Breath and Other (See Comments)    Changed to Plavix due to SOB  . Clindamycin/Lincomycin Anaphylaxis    Whole body skin peeling, blisters  . Doxycycline Anaphylaxis  . Penicillins Rash and Other (See Comments)    PATIENT HAS HAD A PCN REACTION WITH IMMEDIATE RASH, FACIAL/TONGUE/THROAT SWELLING, SOB, OR LIGHTHEADEDNESS WITH HYPOTENSION:  #  #  YES  #  #  Has patient had a PCN  reaction causing severe rash involving mucus membranes or skin necrosis: No Has patient had a PCN reaction that required hospitalization: No Has patient had a PCN reaction occurring within the last 10 years: No  . Sulfa Antibiotics Other (See Comments)    Pt does not remember the reaction, but it sure the allergy exists. UNSPECIFIED REACTION    Past Medical History:  Diagnosis Date  . Allergic reaction caused by a drug 11/2017  . Anemia   . Atrial flutter ([place])    a. s/p RFCA of counterclockwise cavo-tricuspid isthmus dependent atrial flutter 2009 by Dr. Rayann Heman.  Marland Kitchen CAD (coronary artery disease) 06/2014   a. 06/2014: abnl nuc. Cath: Totally occluded LAD with faint collaterals (not a candidate for CTO PCI), 90% ramus s/p DES, 50-70% OM2.  . Chronic diastolic CHF (congestive heart failure) ([place])    a. 2D Echo 10/23/13: EF 50-55%, basal mid-inf HK, grade 2 DD, mild MR, mod dilated LA, no sig change from prior.  . CKD (chronic kidney disease), stage III ([place])   . Colostomy in place [facility])   . Complication of anesthesia    " during my kidney stone surgery my heart went out of rhythm  . Compression fracture of L1 lumbar vertebra ([name])   . DCM (dilated cardiomyopathy) ([place]) 02/27/2018   EF 35-40% by TEE 02/2018 felt tachycardia mediated  . Diabetes ([place])    TYPE 2   . Dyslipidemia   . Dyspnea   . Frequent PVCs   . GERD (gastroesophageal reflux disease)   . History of blood transfusion   . History of kidney stones   . History of peptic ulcer disease   . HTN (hypertension)   . Hx of acute renal failure 01/02/10-3/24-11   due to hypovolemic shock, gastroenteritis and dehydration,hospitalized . Did requre a few days of dialysis. Cr at discharge was 1.8  . Kidney disease   . Kidney stone may 2009   Right hydronephrosis, S/P stone removal  . Lower back pain   . Macular degeneration   . OA (osteoarthritis) of hip   . Obesity   . Osteopenia   . PAF (paroxysmal atrial fibrillation) ([name])    not  on long term anticoagulation due to history of heme positive stools and anemia  . Shingles    episode  . Small bowel obstruction, partial ([place]) 2009   Episode  . Ulcerative colitis ([place])    a. s/p total colectomy remotely.   Family History  Problem Relation Age of Onset  . Colon cancer Mother    Past Surgical History:  Procedure Laterality Date  . BACK SURGERY    . CARDIAC CATHETERIZATION  06/19/2014  . CARDIOVERSION N/A 02/23/2018   Procedure: CARDIOVERSION;  Surgeon: Dorothy Spark, MD;  Location: [facility] ENDOSCOPY;  Service: Cardiovascular;  Laterality: N/A;  . COLON RESECTION    . COLOSTOMY    . CORONARY ANGIOPLASTY    . CORONARY STENT PLACEMENT  06/19/2014  DES       dr Martinique  . HARDWARE REMOVAL Left 12/01/2017   Procedure: HARDWARE REMOVAL LEFT ANKLE;  Surgeon: Newt Minion, MD;  Location: [place];  Service: Orthopedics;  Laterality: Left;  . HERNIA REPAIR    . LEFT AND RIGHT HEART CATHETERIZATION WITH CORONARY ANGIOGRAM N/A 06/19/2014   Procedure: LEFT AND RIGHT HEART CATHETERIZATION WITH CORONARY ANGIOGRAM;  Surgeon: Peter M Martinique, MD;  Location[facility] CATH LAB;  Service: Cardiovascular;  Laterality: N/A;  . ORIF ANKLE FRACTURE Left 10/25/2017   Procedure: OPEN REDUCTION INTERNAL FIXATION (ORIF) LEFT ANKLE FRACTURE;  Surgeon: Newt Minion, MD;  Location: [place];  Service: Orthopedics;  Laterality: Left;  . TEE WITHOUT CARDIOVERSION N/A 02/23/2018   Procedure: TRANSESOPHAGEAL ECHOCARDIOGRAM (TEE);  Surgeon: Dorothy Spark, MD;  Location: [facility] ENDOSCOPY;  Service: Cardiovascular;  Laterality: N/A;  . TONSILLECTOMY     Social History   Socioeconomic History  . Marital status: Widowed    Spouse name: Not on file  . Number of children: Not on file  . Years of education: Not on file  . Highest education level: Not on file  Occupational History  . Not on file  Social Needs  . Financial resource strain: Not on file  . Food insecurity:    Worry: Not on file    Inability: Not  on file  . Transportation needs:    Medical: Not on file    Non-medical: Not on file  Tobacco Use  . Smoking status: Never Smoker  . Smokeless tobacco: Never Used  Substance and Sexual Activity  . Alcohol use: No  . Drug use: No  . Sexual activity: Not on file  Lifestyle  . Physical activity:    Days per week: Not on file    Minutes per session: Not on file  . Stress: Not on file  Relationships  . Social connections:    Talks on phone: Not on file    Gets together: Not on file    Attends religious service: Not on file    Active member of club or organization: Not on file    Attends meetings of clubs or organizations: Not on file    Relationship status: Not on file  . Intimate partner violence:    Fear of current or ex partner: Not on file    Emotionally abused: Not on file    Physically abused: Not on file    Forced sexual activity: Not on file  Other Topics Concern  . Not on file  Social History Narrative  . Not on file     Review of Systems: General: negative for chills, fever, night sweats or weight changes.  Cardiovascular: negative for chest pain, dyspnea on exertion, edema, orthopnea, palpitations, paroxysmal nocturnal dyspnea or shortness of breath Dermatological: negative for rash Respiratory: negative for cough or wheezing Urologic: negative for hematuria Abdominal: negative for nausea, vomiting, diarrhea, bright red blood per rectum, melena, or hematemesis Neurologic: negative for visual changes, syncope, or dizziness All other systems reviewed and are otherwise negative except as noted above.   Physical Exam:  Blood pressure (!) 112/58, pulse 86, height 5' 10"  (1.778 m), weight 231 lb 6.4 oz (105 kg), SpO2 96 %.  General appearance: alert, cooperative, no distress and moderately obese elderly WM Neck: no carotid bruit, no JVD and supple, symmetrical, trachea midline Lungs: clear to auscultation bilaterally Heart: regular rate and rhythm, S1, S2 normal, no  murmur, click, rub or gallop Extremities: 2+ bilateral  LEE Pulses: 2+ and symmetric Skin: Skin color, texture, turgor normal. No rashes or lesions Neurologic: Grossly normal  EKG NSR with PACs -- personally reviewed   ASSESSMENT AND PLAN:   1.  ASCAD - s/p prior PCI with DES to the RI and known chronic total occlusion of the LAD.  He deneis any anginal symptoms.  continue medical therapy w/ aspirin 81 mg daily and statin. No BB due to h/o bradycardia.   2.  Atrial fibrillation -per above. Maintaining NSR with low dose amiodarone, 200 mg daily. On low dose Eliquis 2.5 BID for stroke prophylaxis. Also on low dose ASA given CAD. Last CBC in June showed Hgb at 11. Given h/o GIB, will repeat CBC to ensure H/H is stable.   3.  HTN -BP is well controlled today at 112/58.  Currently not on antihypertensive medicines for reasons outlined above, profound bradycardia w/ BBs and hyperkalemia with ACEi.  4.  DCM -echo 03/14/2018 showed  EF 40 to 45%.  He is not on a beta-blocker due to history of bradycardia in the setting of hyperkalemia as well as no ACE due to a history of hyperkalemia in the past.  5.  Chronic combined systolic/diastolic CHF -lungs are CTAB, but he has had some exertional dyspnea. It is noted in prior notes that "he has chronic lower extremity edema which is most likely more from obesity and sedentary state with legs dependent than from CHF". It has been recommended by multiple providers that he try compression stockings, but he reports difficulty getting them on as well as poor fit. He was given new Rx for refitting. Also advised to elevate legs above the level of the heart, any chance he gets while resting at home. Also low sodium diet order written. We will check BNP and BMP and if significant elevation, he may need further titration of his Lasix. We will f/u on results and will advise regarding possible dose change.   6.  Hyperlipidemia - LDL goal is less than 70.  His LDL was 52  on 03/27/2018 and ALT was normal at 20.  Will continue on atorvastatin 40 mg daily.   Follow-Up w/ Dr. Radford Pax in 3 months.   Lubna Stegeman Ladoris Gene, MHS [facility] 07/16/2018 5:04 PM

## 2018-07-16 NOTE — Patient Instructions (Addendum)
Medication Instructions:  Your physician recommends that you continue on your current medications as directed. Please refer to the Current Medication list given to you today.   Labwork: TODAY:  CBC, BMET & PRO BNP  Testing/Procedures: None ordered  Follow-Up: Your physician recommends that you schedule a follow-up appointment in: 3 MONTHS WITH DR. Radford Pax    Any Other Special Instructions Will Be Listed Below (If Applicable). 1.  ELEVATE YOUR LEGS AS MUCH AS POSSIBLE 2.  WEAR COMPRESSION STOCKINGS  DASH Eating Plan DASH stands for "Dietary Approaches to Stop Hypertension." The DASH eating plan is a healthy eating plan that has been shown to reduce high blood pressure (hypertension). It may also reduce your risk for type 2 diabetes, heart disease, and stroke. The DASH eating plan may also help with weight loss. What are tips for following this plan? General guidelines  Avoid eating more than 2,300 mg (milligrams) of salt (sodium) a day. If you have hypertension, you may need to reduce your sodium intake to 1,500 mg a day.  Limit alcohol intake to no more than 1 drink a day for nonpregnant women and 2 drinks a day for men. One drink equals 12 oz of beer, 5 oz of wine, or 1 oz of hard liquor.  Work with your health care provider to maintain a healthy body weight or to lose weight. Ask what an ideal weight is for you.  Get at least 30 minutes of exercise that causes your heart to beat faster (aerobic exercise) most days of the week. Activities may include walking, swimming, or biking.  Work with your health care provider or diet and nutrition specialist (dietitian) to adjust your eating plan to your individual calorie needs. Reading food labels  Check food labels for the amount of sodium per serving. Choose foods with less than 5 percent of the Daily Value of sodium. Generally, foods with less than 300 mg of sodium per serving fit into this eating plan.  To find whole grains, look for  the word "whole" as the first word in the ingredient list. Shopping  Buy products labeled as "low-sodium" or "no salt added."  Buy fresh foods. Avoid canned foods and premade or frozen meals. Cooking  Avoid adding salt when cooking. Use salt-free seasonings or herbs instead of table salt or sea salt. Check with your health care provider or pharmacist before using salt substitutes.  Do not fry foods. Cook foods using healthy methods such as baking, boiling, grilling, and broiling instead.  Cook with heart-healthy oils, such as olive, canola, soybean, or sunflower oil. Meal planning   Eat a balanced diet that includes: ? 5 or more servings of fruits and vegetables each day. At each meal, try to fill half of your plate with fruits and vegetables. ? Up to 6-8 servings of whole grains each day. ? Less than 6 oz of lean meat, poultry, or fish each day. A 3-oz serving of meat is about the same size as a deck of cards. One egg equals 1 oz. ? 2 servings of low-fat dairy each day. ? A serving of nuts, seeds, or beans 5 times each week. ? Heart-healthy fats. Healthy fats called Omega-3 fatty acids are found in foods such as flaxseeds and coldwater fish, like sardines, salmon, and mackerel.  Limit how much you eat of the following: ? Canned or prepackaged foods. ? Food that is high in trans fat, such as fried foods. ? Food that is high in saturated fat, such as fatty  meat. ? Sweets, desserts, sugary drinks, and other foods with added sugar. ? Full-fat dairy products.  Do not salt foods before eating.  Try to eat at least 2 vegetarian meals each week.  Eat more home-cooked food and less restaurant, buffet, and fast food.  When eating at a restaurant, ask that your food be prepared with less salt or no salt, if possible. What foods are recommended? The items listed may not be a complete list. Talk with your dietitian about what dietary choices are best for you. Grains Whole-grain or  whole-wheat bread. Whole-grain or whole-wheat pasta. Brown rice. Modena Morrow. Bulgur. Whole-grain and low-sodium cereals. Pita bread. Low-fat, low-sodium crackers. Whole-wheat flour tortillas. Vegetables Fresh or frozen vegetables (raw, steamed, roasted, or grilled). Low-sodium or reduced-sodium tomato and vegetable juice. Low-sodium or reduced-sodium tomato sauce and tomato paste. Low-sodium or reduced-sodium canned vegetables. Fruits All fresh, dried, or frozen fruit. Canned fruit in natural juice (without added sugar). Meat and other protein foods Skinless chicken or [place]. Ground chicken or [place]. Pork with fat trimmed off. Fish and seafood. Egg whites. Dried beans, peas, or lentils. Unsalted nuts, nut butters, and seeds. Unsalted canned beans. Lean cuts of beef with fat trimmed off. Low-sodium, lean deli meat. Dairy Low-fat (1%) or fat-free (skim) milk. Fat-free, low-fat, or reduced-fat cheeses. Nonfat, low-sodium ricotta or cottage cheese. Low-fat or nonfat yogurt. Low-fat, low-sodium cheese. Fats and oils Soft margarine without trans fats. Vegetable oil. Low-fat, reduced-fat, or light mayonnaise and salad dressings (reduced-sodium). Canola, safflower, olive, soybean, and sunflower oils. Avocado. Seasoning and other foods Herbs. Spices. Seasoning mixes without salt. Unsalted popcorn and pretzels. Fat-free sweets. What foods are not recommended? The items listed may not be a complete list. Talk with your dietitian about what dietary choices are best for you. Grains Baked goods made with fat, such as croissants, muffins, or some breads. Dry pasta or rice meal packs. Vegetables Creamed or fried vegetables. Vegetables in a cheese sauce. Regular canned vegetables (not low-sodium or reduced-sodium). Regular canned tomato sauce and paste (not low-sodium or reduced-sodium). Regular tomato and vegetable juice (not low-sodium or reduced-sodium). Angie Fava. Olives. Fruits Canned fruit in a light  or heavy syrup. Fried fruit. Fruit in cream or butter sauce. Meat and other protein foods Fatty cuts of meat. Ribs. Fried meat. Berniece Salines. Sausage. Bologna and other processed lunch meats. Salami. Fatback. Hotdogs. Bratwurst. Salted nuts and seeds. Canned beans with added salt. Canned or smoked fish. Whole eggs or egg yolks. Chicken or [place] with skin. Dairy Whole or 2% milk, cream, and half-and-half. Whole or full-fat cream cheese. Whole-fat or sweetened yogurt. Full-fat cheese. Nondairy creamers. Whipped toppings. Processed cheese and cheese spreads. Fats and oils Butter. Stick margarine. Lard. Shortening. Ghee. Bacon fat. Tropical oils, such as coconut, palm kernel, or palm oil. Seasoning and other foods Salted popcorn and pretzels. Onion salt, garlic salt, seasoned salt, table salt, and sea salt. Worcestershire sauce. Tartar sauce. Barbecue sauce. Teriyaki sauce. Soy sauce, including reduced-sodium. Steak sauce. Canned and packaged gravies. Fish sauce. Oyster sauce. Cocktail sauce. Horseradish that you find on the shelf. Ketchup. Mustard. Meat flavorings and tenderizers. Bouillon cubes. Hot sauce and Tabasco sauce. Premade or packaged marinades. Premade or packaged taco seasonings. Relishes. Regular salad dressings. Where to find more information:  National Heart, Lung, and [place]: https://wilson-eaton.com/  American Heart Association: www.heart.org Summary  The DASH eating plan is a healthy eating plan that has been shown to reduce high blood pressure (hypertension). It may also reduce your risk for type  2 diabetes, heart disease, and stroke.  With the DASH eating plan, you should limit salt (sodium) intake to 2,300 mg a day. If you have hypertension, you may need to reduce your sodium intake to 1,500 mg a day.  When on the DASH eating plan, aim to eat more fresh fruits and vegetables, whole grains, lean proteins, low-fat dairy, and heart-healthy fats.  Work with your health care provider or  diet and nutrition specialist (dietitian) to adjust your eating plan to your individual calorie needs. This information is not intended to replace advice given to you by your health care provider. Make sure you discuss any questions you have with your health care provider. Document Released: 09/22/2011 Document Revised: 09/26/2016 Document Reviewed: 09/26/2016 Elsevier Interactive Patient Education  Tayson Schein.   If you need a refill on your cardiac medications before your next appointment, please call your pharmacy.

## 2018-07-17 ENCOUNTER — Telehealth: Payer: Self-pay | Admitting: *Deleted

## 2018-07-17 ENCOUNTER — Telehealth: Payer: Self-pay | Admitting: Cardiology

## 2018-07-17 DIAGNOSIS — Z79899 Other long term (current) drug therapy: Secondary | ICD-10-CM

## 2018-07-17 LAB — CBC
Hematocrit: 26.1 % — ABNORMAL LOW (ref 37.5–51.0)
Hemoglobin: 8 g/dL — ABNORMAL LOW (ref 13.0–17.7)
MCH: 25.3 pg — ABNORMAL LOW (ref 26.6–33.0)
MCHC: 30.7 g/dL — ABNORMAL LOW (ref 31.5–35.7)
MCV: 83 fL (ref 79–97)
Platelets: 286 10*3/uL (ref 150–450)
RBC: 3.16 x10E6/uL — ABNORMAL LOW (ref 4.14–5.80)
RDW: 15.4 % (ref 12.3–15.4)
WBC: 7.6 10*3/uL (ref 3.4–10.8)

## 2018-07-17 LAB — BASIC METABOLIC PANEL
BUN/Creatinine Ratio: 30 — ABNORMAL HIGH (ref 10–24)
BUN: 62 mg/dL — ABNORMAL HIGH (ref 8–27)
CO2: 22 mmol/L (ref 20–29)
Calcium: 8.8 mg/dL (ref 8.6–10.2)
Chloride: 98 mmol/L (ref 96–106)
Creatinine, Ser: 2.06 mg/dL — ABNORMAL HIGH (ref 0.76–1.27)
GFR calc Af Amer: 33 mL/min/{1.73_m2} — ABNORMAL LOW (ref >59)
GFR calc non Af Amer: 29 mL/min/{1.73_m2} — ABNORMAL LOW (ref >59)
Glucose: 216 mg/dL — ABNORMAL HIGH (ref 65–99)
Potassium: 4.2 mmol/L (ref 3.5–5.2)
Sodium: 135 mmol/L (ref 134–144)

## 2018-07-17 LAB — PRO B NATRIURETIC PEPTIDE: NT-Pro BNP: 1130 pg/mL — ABNORMAL HIGH (ref 0–486)

## 2018-07-17 MED ORDER — FUROSEMIDE 40 MG PO TABS: 60.0000 mg | ORAL_TABLET | Freq: Two times a day (BID) | ORAL | 3 refills | Status: DC

## 2018-07-17 NOTE — Telephone Encounter (Signed)
-----   Message from Consuelo Pandy, [place] sent at 07/17/2018  2:08 PM EDT ----- Labs are abnormal.   1. Hgb has dropped from 10.6 to 8.0. He denied melena at [facility] yesterday, but recommend FOBT (test to check stool for blood), repeat CBC on Thursday + iron and ferritin labs. Recommend that he stop ASA for now. Continue Priolsec. If further reduction in Hgb on Thursday or if FOBT +, then we may need to stop Eliquis.    2. BNP (fluid marker elevated). SCr (measure of kidney function is elevated but may be secondary to increased fluid and anemi). Recommend increasing Lasix to 60 mg BID and get repeat BMP and BMP on Thursday.   Try to get in for repeat OV next week w/ APP.

## 2018-07-17 NOTE — Telephone Encounter (Signed)
Spoke with pt re: lab results. He has been made aware that the recommendations, med changes, and all have been faxed over to [facility] @ [facility]. Pt thanked me for following up with him.

## 2018-07-17 NOTE — Telephone Encounter (Signed)
Follow up    Patient returning call back about results

## 2018-07-18 ENCOUNTER — Telehealth: Payer: Self-pay | Admitting: Cardiology

## 2018-07-18 NOTE — Telephone Encounter (Signed)
° °  Shawna from [facility] calling for actual order for fluid restrictions and medications   Phone [phone] Fax [phone]

## 2018-07-18 NOTE — Telephone Encounter (Signed)
Returned [employer].  We discussed pts orders verbally given yesterday and faxed over. She just needed clarification. Orders have been refaxed per her request.

## 2018-07-19 ENCOUNTER — Other Ambulatory Visit: Payer: Medicare Other

## 2018-07-19 ENCOUNTER — Other Ambulatory Visit: Payer: Self-pay | Admitting: Cardiology

## 2018-07-19 DIAGNOSIS — I420 Dilated cardiomyopathy: Secondary | ICD-10-CM | POA: Diagnosis not present

## 2018-07-19 DIAGNOSIS — Z79899 Other long term (current) drug therapy: Secondary | ICD-10-CM | POA: Diagnosis not present

## 2018-07-19 DIAGNOSIS — K922 Gastrointestinal hemorrhage, unspecified: Secondary | ICD-10-CM | POA: Diagnosis not present

## 2018-07-20 ENCOUNTER — Telehealth: Payer: Self-pay

## 2018-07-20 DIAGNOSIS — D649 Anemia, unspecified: Secondary | ICD-10-CM

## 2018-07-20 LAB — CBC
Hematocrit: 25.8 % — ABNORMAL LOW (ref 37.5–51.0)
Hemoglobin: 8 g/dL — ABNORMAL LOW (ref 13.0–17.7)
MCH: 25.3 pg — ABNORMAL LOW (ref 26.6–33.0)
MCHC: 31 g/dL — ABNORMAL LOW (ref 31.5–35.7)
MCV: 82 fL (ref 79–97)
Platelets: 320 10*3/uL (ref 150–450)
RBC: 3.16 x10E6/uL — ABNORMAL LOW (ref 4.14–5.80)
RDW: 16 % — ABNORMAL HIGH (ref 12.3–15.4)
WBC: 7.8 10*3/uL (ref 3.4–10.8)

## 2018-07-20 LAB — BASIC METABOLIC PANEL
BUN/Creatinine Ratio: 25 — ABNORMAL HIGH (ref 10–24)
BUN: 53 mg/dL — ABNORMAL HIGH (ref 8–27)
CO2: 21 mmol/L (ref 20–29)
Calcium: 8.7 mg/dL (ref 8.6–10.2)
Chloride: 96 mmol/L (ref 96–106)
Creatinine, Ser: 2.1 mg/dL — ABNORMAL HIGH (ref 0.76–1.27)
GFR calc Af Amer: 33 mL/min/{1.73_m2} — ABNORMAL LOW (ref >59)
GFR calc non Af Amer: 28 mL/min/{1.73_m2} — ABNORMAL LOW (ref >59)
Glucose: 87 mg/dL (ref 65–99)
Potassium: 4.6 mmol/L (ref 3.5–5.2)
Sodium: 136 mmol/L (ref 134–144)

## 2018-07-20 LAB — PRO B NATRIURETIC PEPTIDE: NT-Pro BNP: 1351 pg/mL — ABNORMAL HIGH (ref 0–486)

## 2018-07-20 LAB — FERRITIN: Ferritin: 28 ng/mL — ABNORMAL LOW (ref 30–400)

## 2018-07-20 NOTE — Telephone Encounter (Signed)
-----   Message from [place], [place] sent at 07/20/2018  3:58 PM EDT ----- No further drop in Hgb since 4 days ago. Stable at 8.0. Still awaiting FOBT (stool test to check for bleeding in GI tract). Will notify when results return. Recommend continued avoidance of ASA. If pt notes any dark stools over the weekend report to MD at nursing facility and contact our office, since he is on low dose Eliquis for afib. I recommend another CBC on Tuesday of next week just to ensure no further drop. His ferritin levels are low (storred iron). It is possible that his anemia is secondary to chronic kidney disease and mabe not a GI bleed. Recommend nursing facility start him on oral iron supplements. Repeat CBC on Tuesday.   BNP (fluid marker) still elevated. Check to see if any increased urinary response to increased dose of Lasix. Any improvement with edema? Any success getting the compression stockings? When he gets repeat CBC on Tuesday, also check CMP to check albumin level given his chronic LE edema.

## 2018-07-20 NOTE — Telephone Encounter (Signed)
Notes recorded by Frederik Schmidt, RN on 07/20/2018 at 5:13 PM EDT Spoke to [facility] who cares for patient at the facility. She will bring the patient to the office for labs on Tuesday. I need to know the amount of iron supplement for the patient. I will not be able to fax the order until 10/7, because the facility is now closed and I am not able to retrieve the fax number. Thank you. ------

## 2018-07-23 ENCOUNTER — Telehealth: Payer: Self-pay

## 2018-07-23 NOTE — Telephone Encounter (Signed)
-----   Message from [place], [place] sent at 07/23/2018  4:13 PM EDT ----- Can start ferrous gluconate 324 mg 3 days a week (M, W, F). Recheck CBC and Ferritin in 6 weeks. I still want CBC tomorrow to ensure no acute change in Hgb since last week.

## 2018-07-23 NOTE — Telephone Encounter (Signed)
Notes recorded by Frederik Schmidt, RN on 07/23/2018 at 5:08 PM EDT Spoke to Dr Radford Pax to sign Iron supplement prescription, but she would like a full Iron panel drawn 1st on 10/8. ------

## 2018-07-23 NOTE — Telephone Encounter (Signed)
-----   Message from [place], [place] sent at 07/23/2018  4:13 PM EDT ----- Can start ferrous gluconate 324 mg 3 days a week (M, W, F). Recheck CBC and Ferritin in 6 weeks. I still want CBC tomorrow to ensure no acute change in Hgb since last week.

## 2018-07-24 ENCOUNTER — Other Ambulatory Visit: Payer: Medicare Other

## 2018-07-24 DIAGNOSIS — D649 Anemia, unspecified: Secondary | ICD-10-CM

## 2018-07-24 LAB — COMPREHENSIVE METABOLIC PANEL
ALT: 14 IU/L (ref 0–44)
AST: 22 IU/L (ref 0–40)
Albumin/Globulin Ratio: 1 — ABNORMAL LOW (ref 1.2–2.2)
Albumin: 3.2 g/dL — ABNORMAL LOW (ref 3.5–4.7)
Alkaline Phosphatase: 120 IU/L — ABNORMAL HIGH (ref 39–117)
BUN/Creatinine Ratio: 25 — ABNORMAL HIGH (ref 10–24)
BUN: 52 mg/dL — ABNORMAL HIGH (ref 8–27)
Bilirubin Total: 0.3 mg/dL (ref 0.0–1.2)
CO2: 20 mmol/L (ref 20–29)
Calcium: 8.8 mg/dL (ref 8.6–10.2)
Chloride: 100 mmol/L (ref 96–106)
Creatinine, Ser: 2.08 mg/dL — ABNORMAL HIGH (ref 0.76–1.27)
GFR calc Af Amer: 33 mL/min/{1.73_m2} — ABNORMAL LOW (ref >59)
GFR calc non Af Amer: 29 mL/min/{1.73_m2} — ABNORMAL LOW (ref >59)
Globulin, Total: 3.2 g/dL (ref 1.5–4.5)
Glucose: 152 mg/dL — ABNORMAL HIGH (ref 65–99)
Potassium: 4.4 mmol/L (ref 3.5–5.2)
Sodium: 138 mmol/L (ref 134–144)
Total Protein: 6.4 g/dL (ref 6.0–8.5)

## 2018-07-24 LAB — CBC
Hematocrit: 24.7 % — ABNORMAL LOW (ref 37.5–51.0)
Hemoglobin: 7.7 g/dL — ABNORMAL LOW (ref 13.0–17.7)
MCH: 24.8 pg — ABNORMAL LOW (ref 26.6–33.0)
MCHC: 31.2 g/dL — ABNORMAL LOW (ref 31.5–35.7)
MCV: 80 fL (ref 79–97)
Platelets: 303 10*3/uL (ref 150–450)
RBC: 3.1 x10E6/uL — ABNORMAL LOW (ref 4.14–5.80)
RDW: 16 % — ABNORMAL HIGH (ref 12.3–15.4)
WBC: 7.8 10*3/uL (ref 3.4–10.8)

## 2018-07-25 ENCOUNTER — Telehealth: Payer: Self-pay | Admitting: Cardiology

## 2018-07-25 NOTE — Telephone Encounter (Signed)
Informed the patient that when the results are back, we will contact him further with recommendations.

## 2018-07-25 NOTE — Telephone Encounter (Signed)
CBC abnormal. Hgb now 7.7. I notified Dr. Radford Pax. She has recommended we stop Eliquis and have pt f/u with his PCP tomorrow. I contacted [facility] and spoke to RN and gave instructions, verbal order to stop Eliquis and arrange PCP follow-up tomorrow. She accepted verbal order but will need fax sent to [facility] on 10/10 stating to d/c Eliquis. RN will not give Eliquis tonight and will f/u with our office tomorrow for written order.   Order: STOP Eliquis 2.5 mg BID.   Electronic fax sent to [phone].

## 2018-07-25 NOTE — Telephone Encounter (Signed)
Follow up    Patient is returning call in reference to recent lab results. Please call to discuss.

## 2018-07-25 NOTE — Telephone Encounter (Signed)
Addendum:  Patient: [name] [name] DOB: August 13, 1933  CBC abnormal. Hgb now 7.7. I notified Dr. Radford Pax. She has recommended we stop Eliquis and have pt f/u with his PCP tomorrow. I contacted [facility] and spoke to RN and gave instructions, verbal order to stop Eliquis and arrange PCP follow-up tomorrow. She accepted verbal order but will need fax sent to [facility] on 10/10 stating to d/c Eliquis. RN will not give Eliquis tonight and will f/u with our office tomorrow for written order.   Order: STOP Eliquis 2.5 mg BID.   Electronic fax sent to [phone].

## 2018-07-26 ENCOUNTER — Encounter (HOSPITAL_COMMUNITY): Payer: Medicare Other

## 2018-07-26 ENCOUNTER — Inpatient Hospital Stay (HOSPITAL_COMMUNITY)
Admission: EM | Admit: 2018-07-26 | Discharge: 2018-07-28 | DRG: 378 | Disposition: A | Discharge status: Nursing Home (Includes ICF) | Payer: Medicare Other | Attending: Internal Medicine | Admitting: Internal Medicine

## 2018-07-26 ENCOUNTER — Encounter (HOSPITAL_COMMUNITY): Payer: Self-pay | Admitting: Emergency Medicine

## 2018-07-26 ENCOUNTER — Other Ambulatory Visit: Payer: Self-pay

## 2018-07-26 ENCOUNTER — Emergency Department (HOSPITAL_COMMUNITY): Payer: Medicare Other

## 2018-07-26 DIAGNOSIS — D649 Anemia, unspecified: Secondary | ICD-10-CM | POA: Diagnosis present

## 2018-07-26 DIAGNOSIS — I10 Essential (primary) hypertension: Secondary | ICD-10-CM | POA: Diagnosis not present

## 2018-07-26 DIAGNOSIS — I420 Dilated cardiomyopathy: Secondary | ICD-10-CM | POA: Diagnosis present

## 2018-07-26 DIAGNOSIS — N184 Chronic kidney disease, stage 4 (severe): Secondary | ICD-10-CM | POA: Diagnosis present

## 2018-07-26 DIAGNOSIS — N183 Chronic kidney disease, stage 3 (moderate): Secondary | ICD-10-CM

## 2018-07-26 DIAGNOSIS — I5042 Chronic combined systolic (congestive) and diastolic (congestive) heart failure: Secondary | ICD-10-CM | POA: Diagnosis present

## 2018-07-26 DIAGNOSIS — E1122 Type 2 diabetes mellitus with diabetic chronic kidney disease: Secondary | ICD-10-CM | POA: Diagnosis present

## 2018-07-26 DIAGNOSIS — I272 Pulmonary hypertension, unspecified: Secondary | ICD-10-CM | POA: Diagnosis present

## 2018-07-26 DIAGNOSIS — Z933 Colostomy status: Secondary | ICD-10-CM

## 2018-07-26 DIAGNOSIS — R58 Hemorrhage, not elsewhere classified: Secondary | ICD-10-CM | POA: Diagnosis not present

## 2018-07-26 DIAGNOSIS — Z955 Presence of coronary angioplasty implant and graft: Secondary | ICD-10-CM

## 2018-07-26 DIAGNOSIS — I480 Paroxysmal atrial fibrillation: Secondary | ICD-10-CM | POA: Diagnosis present

## 2018-07-26 DIAGNOSIS — I130 Hypertensive heart and chronic kidney disease with heart failure and stage 1 through stage 4 chronic kidney disease, or unspecified chronic kidney disease: Secondary | ICD-10-CM | POA: Diagnosis present

## 2018-07-26 DIAGNOSIS — Z880 Allergy status to penicillin: Secondary | ICD-10-CM

## 2018-07-26 DIAGNOSIS — K250 Acute gastric ulcer with hemorrhage: Principal | ICD-10-CM

## 2018-07-26 DIAGNOSIS — Z881 Allergy status to other antibiotic agents status: Secondary | ICD-10-CM

## 2018-07-26 DIAGNOSIS — Z9049 Acquired absence of other specified parts of digestive tract: Secondary | ICD-10-CM

## 2018-07-26 DIAGNOSIS — D62 Acute posthemorrhagic anemia: Secondary | ICD-10-CM | POA: Diagnosis not present

## 2018-07-26 DIAGNOSIS — Z888 Allergy status to other drugs, medicaments and biological substances status: Secondary | ICD-10-CM

## 2018-07-26 DIAGNOSIS — D500 Iron deficiency anemia secondary to blood loss (chronic): Secondary | ICD-10-CM | POA: Diagnosis not present

## 2018-07-26 DIAGNOSIS — R0902 Hypoxemia: Secondary | ICD-10-CM | POA: Diagnosis not present

## 2018-07-26 DIAGNOSIS — I4891 Unspecified atrial fibrillation: Secondary | ICD-10-CM | POA: Diagnosis not present

## 2018-07-26 DIAGNOSIS — I447 Left bundle-branch block, unspecified: Secondary | ICD-10-CM | POA: Diagnosis not present

## 2018-07-26 DIAGNOSIS — Z79899 Other long term (current) drug therapy: Secondary | ICD-10-CM

## 2018-07-26 DIAGNOSIS — E785 Hyperlipidemia, unspecified: Secondary | ICD-10-CM | POA: Diagnosis present

## 2018-07-26 DIAGNOSIS — E861 Hypovolemia: Secondary | ICD-10-CM | POA: Diagnosis not present

## 2018-07-26 DIAGNOSIS — R55 Syncope and collapse: Secondary | ICD-10-CM | POA: Diagnosis not present

## 2018-07-26 DIAGNOSIS — I9589 Other hypotension: Secondary | ICD-10-CM | POA: Diagnosis not present

## 2018-07-26 DIAGNOSIS — I2510 Atherosclerotic heart disease of native coronary artery without angina pectoris: Secondary | ICD-10-CM | POA: Diagnosis present

## 2018-07-26 DIAGNOSIS — E669 Obesity, unspecified: Secondary | ICD-10-CM | POA: Diagnosis present

## 2018-07-26 DIAGNOSIS — E0821 Diabetes mellitus due to underlying condition with diabetic nephropathy: Secondary | ICD-10-CM

## 2018-07-26 DIAGNOSIS — Z882 Allergy status to sulfonamides status: Secondary | ICD-10-CM

## 2018-07-26 DIAGNOSIS — H3530 Unspecified macular degeneration: Secondary | ICD-10-CM | POA: Diagnosis present

## 2018-07-26 DIAGNOSIS — K219 Gastro-esophageal reflux disease without esophagitis: Secondary | ICD-10-CM | POA: Diagnosis present

## 2018-07-26 DIAGNOSIS — R531 Weakness: Secondary | ICD-10-CM

## 2018-07-26 DIAGNOSIS — Z7901 Long term (current) use of anticoagulants: Secondary | ICD-10-CM

## 2018-07-26 DIAGNOSIS — R000 Tachycardia, unspecified: Secondary | ICD-10-CM | POA: Diagnosis not present

## 2018-07-26 DIAGNOSIS — Z6832 Body mass index (BMI) 32.0-32.9, adult: Secondary | ICD-10-CM

## 2018-07-26 DIAGNOSIS — R42 Dizziness and giddiness: Secondary | ICD-10-CM

## 2018-07-26 DIAGNOSIS — I4892 Unspecified atrial flutter: Secondary | ICD-10-CM | POA: Diagnosis present

## 2018-07-26 DIAGNOSIS — I959 Hypotension, unspecified: Secondary | ICD-10-CM | POA: Diagnosis not present

## 2018-07-26 LAB — COMPREHENSIVE METABOLIC PANEL
ALT: 16 U/L (ref 0–44)
AST: 29 U/L (ref 15–41)
Albumin: 2.9 g/dL — ABNORMAL LOW (ref 3.5–5.0)
Alkaline Phosphatase: 108 U/L (ref 38–126)
Anion gap: 12 (ref 5–15)
BUN: 49 mg/dL — ABNORMAL HIGH (ref 8–23)
CO2: 23 mmol/L (ref 22–32)
Calcium: 8.9 mg/dL (ref 8.9–10.3)
Chloride: 101 mmol/L (ref 98–111)
Creatinine, Ser: 2.1 mg/dL — ABNORMAL HIGH (ref 0.61–1.24)
GFR calc Af Amer: 32 mL/min — ABNORMAL LOW (ref >60)
GFR calc non Af Amer: 27 mL/min — ABNORMAL LOW (ref >60)
Glucose, Bld: 143 mg/dL — ABNORMAL HIGH (ref 70–99)
Potassium: 4.2 mmol/L (ref 3.5–5.1)
Sodium: 136 mmol/L (ref 135–145)
Total Bilirubin: 0.5 mg/dL (ref 0.3–1.2)
Total Protein: 7.1 g/dL (ref 6.5–8.1)

## 2018-07-26 LAB — CBC WITH DIFFERENTIAL/PLATELET
Abs Immature Granulocytes: 0.04 10*3/uL (ref 0.00–0.07)
Basophils Absolute: 0.1 10*3/uL (ref 0.0–0.1)
Basophils Relative: 1 %
Eosinophils Absolute: 0.3 10*3/uL (ref 0.0–0.5)
Eosinophils Relative: 3 %
HCT: 28 % — ABNORMAL LOW (ref 39.0–52.0)
Hemoglobin: 8.1 g/dL — ABNORMAL LOW (ref 13.0–17.0)
Immature Granulocytes: 0 %
Lymphocytes Relative: 15 %
Lymphs Abs: 1.4 10*3/uL (ref 0.7–4.0)
MCH: 24.2 pg — ABNORMAL LOW (ref 26.0–34.0)
MCHC: 28.9 g/dL — ABNORMAL LOW (ref 30.0–36.0)
MCV: 83.6 fL (ref 80.0–100.0)
Monocytes Absolute: 0.8 10*3/uL (ref 0.1–1.0)
Monocytes Relative: 9 %
Neutro Abs: 6.6 10*3/uL (ref 1.7–7.7)
Neutrophils Relative %: 72 %
Platelets: 316 10*3/uL (ref 150–400)
RBC: 3.35 MIL/uL — ABNORMAL LOW (ref 4.22–5.81)
RDW: 15.8 % — ABNORMAL HIGH (ref 11.5–15.5)
WBC: 9.2 10*3/uL (ref 4.0–10.5)
nRBC: 0 % (ref 0.0–0.2)

## 2018-07-26 LAB — URINALYSIS, ROUTINE W REFLEX MICROSCOPIC
Bilirubin Urine: NEGATIVE
Glucose, UA: NEGATIVE mg/dL
Ketones, ur: NEGATIVE mg/dL
Nitrite: NEGATIVE
Protein, ur: NEGATIVE mg/dL
Specific Gravity, Urine: 1.009 (ref 1.005–1.030)
WBC, UA: >50 WBC/hpf — ABNORMAL HIGH (ref 0–5)
pH: 5 (ref 5.0–8.0)

## 2018-07-26 LAB — RETICULOCYTES
Immature Retic Fract: 27.2 % — ABNORMAL HIGH (ref 2.3–15.9)
RBC.: 3.35 MIL/uL — ABNORMAL LOW (ref 4.22–5.81)
Retic Count, Absolute: 80.7 10*3/uL (ref 19.0–186.0)
Retic Ct Pct: 2.4 % (ref 0.4–3.1)

## 2018-07-26 LAB — GLUCOSE, CAPILLARY: Glucose-Capillary: 104 mg/dL — ABNORMAL HIGH (ref 70–99)

## 2018-07-26 LAB — POC OCCULT BLOOD, ED: Fecal Occult Bld: POSITIVE — AB

## 2018-07-26 LAB — I-STAT CG4 LACTIC ACID, ED: Lactic Acid, Venous: 1.55 mmol/L (ref 0.5–1.9)

## 2018-07-26 LAB — IRON AND TIBC
Iron Saturation: 7 % — CL (ref 15–55)
Iron: 21 ug/dL — ABNORMAL LOW (ref 38–169)
Iron: 21 ug/dL — ABNORMAL LOW (ref 45–182)
Saturation Ratios: 6 % — ABNORMAL LOW (ref 17.9–39.5)
TIBC: 371 ug/dL (ref 250–450)
Total Iron Binding Capacity: 322 ug/dL (ref 250–450)
UIBC: 301 ug/dL (ref 111–343)
UIBC: 350 ug/dL

## 2018-07-26 LAB — CBC
HCT: 25.8 % — ABNORMAL LOW (ref 39.0–52.0)
Hemoglobin: 7.4 g/dL — ABNORMAL LOW (ref 13.0–17.0)
MCH: 23.5 pg — ABNORMAL LOW (ref 26.0–34.0)
MCHC: 28.7 g/dL — ABNORMAL LOW (ref 30.0–36.0)
MCV: 81.9 fL (ref 80.0–100.0)
Platelets: 314 10*3/uL (ref 150–400)
RBC: 3.15 MIL/uL — ABNORMAL LOW (ref 4.22–5.81)
RDW: 15.7 % — ABNORMAL HIGH (ref 11.5–15.5)
WBC: 7.8 10*3/uL (ref 4.0–10.5)
nRBC: 0.3 % — ABNORMAL HIGH (ref 0.0–0.2)

## 2018-07-26 LAB — PREPARE RBC (CROSSMATCH)

## 2018-07-26 LAB — I-STAT TROPONIN, ED: Troponin i, poc: 0.01 ng/mL (ref 0.00–0.08)

## 2018-07-26 LAB — FOLATE: Folate: 42.6 ng/mL (ref >5.9)

## 2018-07-26 LAB — SPECIMEN STATUS REPORT

## 2018-07-26 LAB — FERRITIN: Ferritin: 17 ng/mL — ABNORMAL LOW (ref 24–336)

## 2018-07-26 LAB — MAGNESIUM: Magnesium: 1.8 mg/dL (ref 1.7–2.4)

## 2018-07-26 LAB — VITAMIN B12: Vitamin B-12: 372 pg/mL (ref 180–914)

## 2018-07-26 LAB — TSH: TSH: 1.345 u[IU]/mL (ref 0.350–4.500)

## 2018-07-26 LAB — PHOSPHORUS: Phosphorus: 3.9 mg/dL (ref 2.5–4.6)

## 2018-07-26 LAB — BRAIN NATRIURETIC PEPTIDE: B Natriuretic Peptide: 188.6 pg/mL — ABNORMAL HIGH (ref 0.0–100.0)

## 2018-07-26 MED ORDER — ATORVASTATIN CALCIUM 40 MG PO TABS
40.0000 mg | ORAL_TABLET | Freq: Every day | ORAL | Status: DC
  Administered 2018-07-26 – 2018-07-27 (×2): 40 mg via ORAL
  Filled 2018-07-26 (×3): qty 1

## 2018-07-26 MED ORDER — DOCUSATE SODIUM 100 MG PO CAPS
100.0000 mg | ORAL_CAPSULE | Freq: Two times a day (BID) | ORAL | Status: DC
  Administered 2018-07-26 – 2018-07-28 (×3): 100 mg via ORAL
  Filled 2018-07-26 (×4): qty 1

## 2018-07-26 MED ORDER — FERROUS SULFATE 325 (65 FE) MG PO TABS
325.0000 mg | ORAL_TABLET | Freq: Two times a day (BID) | ORAL | Status: DC
  Filled 2018-07-26: qty 1

## 2018-07-26 MED ORDER — SODIUM CHLORIDE 0.9 % IV SOLN
510.0000 mg | Freq: Once | INTRAVENOUS | Status: AC
  Administered 2018-07-26: 510 mg via INTRAVENOUS
  Filled 2018-07-26: qty 17

## 2018-07-26 MED ORDER — PANTOPRAZOLE SODIUM 40 MG PO TBEC
40.0000 mg | DELAYED_RELEASE_TABLET | Freq: Two times a day (BID) | ORAL | Status: DC
  Administered 2018-07-26 – 2018-07-28 (×3): 40 mg via ORAL
  Filled 2018-07-26 (×4): qty 1

## 2018-07-26 MED ORDER — SODIUM CHLORIDE 0.9 % IV BOLUS
500.0000 mL | Freq: Once | INTRAVENOUS | Status: AC
  Administered 2018-07-26: 500 mL via INTRAVENOUS

## 2018-07-26 MED ORDER — INSULIN ASPART 100 UNIT/ML ~~LOC~~ SOLN: 0.0000 [IU] | Freq: Every day | SUBCUTANEOUS | Status: DC

## 2018-07-26 MED ORDER — SODIUM CHLORIDE 0.9 % IV SOLN
1.0000 g | Freq: Once | INTRAVENOUS | Status: AC
  Administered 2018-07-26: 1 g via INTRAVENOUS
  Filled 2018-07-26: qty 10

## 2018-07-26 MED ORDER — ZINC OXIDE 12.8 % EX OINT
1.0000 "application " | TOPICAL_OINTMENT | Freq: Three times a day (TID) | CUTANEOUS | Status: DC
  Administered 2018-07-27 – 2018-07-28 (×4): 1 via TOPICAL
  Filled 2018-07-26 (×2): qty 56.7

## 2018-07-26 MED ORDER — PRO-STAT SUGAR FREE PO LIQD
30.0000 mL | Freq: Every day | ORAL | Status: DC
  Administered 2018-07-28: 30 mL via ORAL
  Filled 2018-07-26 (×2): qty 30

## 2018-07-26 MED ORDER — ADULT MULTIVITAMIN W/MINERALS CH
1.0000 | ORAL_TABLET | Freq: Every day | ORAL | Status: DC
  Administered 2018-07-28: 1 via ORAL
  Filled 2018-07-26 (×2): qty 1

## 2018-07-26 MED ORDER — FAMOTIDINE IN NACL 20-0.9 MG/50ML-% IV SOLN
20.0000 mg | Freq: Two times a day (BID) | INTRAVENOUS | Status: DC
  Administered 2018-07-27 (×2): 20 mg via INTRAVENOUS
  Filled 2018-07-26 (×2): qty 50

## 2018-07-26 MED ORDER — INSULIN ASPART 100 UNIT/ML ~~LOC~~ SOLN
0.0000 [IU] | Freq: Three times a day (TID) | SUBCUTANEOUS | Status: DC
  Administered 2018-07-27: 3 [IU] via SUBCUTANEOUS

## 2018-07-26 MED ORDER — FUROSEMIDE 40 MG PO TABS
60.0000 mg | ORAL_TABLET | Freq: Two times a day (BID) | ORAL | Status: DC
  Administered 2018-07-27 – 2018-07-28 (×2): 60 mg via ORAL
  Filled 2018-07-26 (×3): qty 1

## 2018-07-26 MED ORDER — GLIMEPIRIDE 1 MG PO TABS
1.0000 mg | ORAL_TABLET | Freq: Two times a day (BID) | ORAL | Status: DC
  Filled 2018-07-26 (×2): qty 1

## 2018-07-26 MED ORDER — LINAGLIPTIN 5 MG PO TABS
5.0000 mg | ORAL_TABLET | Freq: Every day | ORAL | Status: DC
  Filled 2018-07-26: qty 1

## 2018-07-26 MED ORDER — SODIUM CHLORIDE 0.9% IV SOLUTION: Freq: Once | INTRAVENOUS | Status: DC

## 2018-07-26 MED ORDER — CALCIUM CARBONATE 1250 (500 CA) MG PO TABS
1250.0000 mg | ORAL_TABLET | Freq: Every day | ORAL | Status: DC
  Administered 2018-07-28: 1250 mg via ORAL
  Filled 2018-07-26 (×2): qty 1

## 2018-07-26 MED ORDER — AMIODARONE HCL 200 MG PO TABS
200.0000 mg | ORAL_TABLET | Freq: Every day | ORAL | Status: DC
  Administered 2018-07-28: 200 mg via ORAL
  Filled 2018-07-26 (×2): qty 1

## 2018-07-26 NOTE — ED Triage Notes (Signed)
To ED via [facility] from dr's office with c/o GI bleed from colostomy. Per EMS pt's BP was 70/p enroute-- on arrival pt is alert/oriented x4- bP is in 314H systolic-  Pale mucus membranes, IV in left wrist per EMS- received approx 100cc.

## 2018-07-26 NOTE — ED Notes (Signed)
Pt not appropriate for 5C observation due to possible endoscopy tomorrow, symptomatic anemia with transfusions, from [place], and unable to ambulate more than short distances. Spoke with patient placement and [place] staff. Pt moved to hallway on monitor at this time.

## 2018-07-26 NOTE — ED Provider Notes (Signed)
[facility] EMERGENCY DEPARTMENT Provider Note   CSN: [id number] Arrival date & time: 07/26/18  1131     History   Chief Complaint Chief Complaint  Patient presents with  . GI Bleeding    HPI SCHUYLER OLDEN is a 84 y.o. male hx of CAD, CHF, CKD, afib on eliquis, ulcerative colitis s/p colostomy, here with near syncope, possible GI bleed.  Patient states that he is residing at [facility] assisted [facility] currently.  Has been having diffuse weakness over the last several weeks.  Has been back and forth from the cardiologist office and has been getting serial CBCs.  His CBC days ago showed a hemoglobin 7.7.  He was taken off of Eliquis and supposed to start iron supplementation but the facility did not have the iron pills.  He was at his primary care doctor Dr. Sherilyn Cooter office today for follow-up.  He states that he felt lightheaded dizzy at the doctor's office.  EMS was called and his blood pressure was 70 over pal at that time. States that he occasionally gets blood in his colostomy bag.   The history is provided by the patient.    Past Medical History:  Diagnosis Date  . Allergic reaction caused by a drug 11/2017  . Anemia   . Atrial flutter ([place])    a. s/p RFCA of counterclockwise cavo-tricuspid isthmus dependent atrial flutter 2009 by Dr. Rayann Heman.  Marland Kitchen CAD (coronary artery disease) 06/2014   a. 06/2014: abnl nuc. Cath: Totally occluded LAD with faint collaterals (not a candidate for CTO PCI), 90% ramus s/p DES, 50-70% OM2.  . Chronic diastolic CHF (congestive heart failure) ([place])    a. 2D Echo 10/23/13: EF 50-55%, basal mid-inf HK, grade 2 DD, mild MR, mod dilated LA, no sig change from prior.  . CKD (chronic kidney disease), stage III ([place])   . Colostomy in place [facility])   . Complication of anesthesia    " during my kidney stone surgery my heart went out of rhythm  . Compression fracture of L1 lumbar vertebra ([name])   . DCM (dilated cardiomyopathy) ([place]) 02/27/2018   EF  35-40% by TEE 02/2018 felt tachycardia mediated  . Diabetes ([place])    TYPE 2   . Dyslipidemia   . Dyspnea   . Frequent PVCs   . GERD (gastroesophageal reflux disease)   . History of blood transfusion   . History of kidney stones   . History of peptic ulcer disease   . HTN (hypertension)   . Hx of acute renal failure 01/02/10-3/24-11   due to hypovolemic shock, gastroenteritis and dehydration,hospitalized . Did requre a few days of dialysis. Cr at discharge was 1.8  . Kidney disease   . Kidney stone may 2009   Right hydronephrosis, S/P stone removal  . Lower back pain   . Macular degeneration   . OA (osteoarthritis) of hip   . Obesity   . Osteopenia   . PAF (paroxysmal atrial fibrillation) ([name])    not on long term anticoagulation due to history of heme positive stools and anemia  . Shingles    episode  . Small bowel obstruction, partial ([place]) 2009   Episode  . Ulcerative colitis ([place])    a. s/p total colectomy remotely.    Patient Active Problem List   Diagnosis Date Noted  . Chronic combined systolic and diastolic CHF (congestive heart failure) ([place]) 04/10/2018  . Pelvic fracture ([place]) 03/28/2018  . Severe protein-calorie malnutrition ([place])   .  Diabetic polyneuropathy associated with type 2 diabetes mellitus ([place])   . DCM (dilated cardiomyopathy) ([place]) 02/27/2018  . Pressure injury of skin 02/20/2018  . Hyperkalemia 02/19/2018  . Elevated troponin 02/19/2018  . GERD (gastroesophageal reflux disease) 02/19/2018  . Type II diabetes mellitus with renal manifestations ([place]) 02/19/2018  . HTN (hypertension) 02/19/2018  . AKI (acute kidney injury) ([place])   . Anaphylactic syndrome   . Drug-induced skin rash   . Acute metabolic encephalopathy 60/63/0160  . Allergic reaction caused by a drug 12/05/2017  . Acute renal failure superimposed on stage 3 chronic kidney disease ([place]) 12/05/2017  . Metabolic acidosis 10/93/2355  . Hardware complicating wound infection ([place]) 11/30/2017   . Trimalleolar fracture of ankle, closed, left, sequela   . Fall 10/24/2017  . Malleolar fracture, left, closed, initial encounter 10/24/2017  . Fibula fracture 10/24/2017  . Diabetes mellitus with diabetic nephropathy without long-term current use of insulin ([place]) 01/05/2017  . CKD (chronic kidney disease), stage III ([place])   . Persistent atrial fibrillation   . Bleeding gastrointestinal   . Dyslipidemia 10/14/2014  . CAD (coronary artery disease) 07/11/2014  . Ulcerative colitis [facility])     Past Surgical History:  Procedure Laterality Date  . BACK SURGERY    . CARDIAC CATHETERIZATION  06/19/2014  . CARDIOVERSION N/A 02/23/2018   Procedure: CARDIOVERSION;  Surgeon: Dorothy Spark, MD;  Location: [facility] ENDOSCOPY;  Service: Cardiovascular;  Laterality: N/A;  . COLON RESECTION    . COLOSTOMY    . CORONARY ANGIOPLASTY    . CORONARY STENT PLACEMENT  06/19/2014   DES       dr Martinique  . HARDWARE REMOVAL Left 12/01/2017   Procedure: HARDWARE REMOVAL LEFT ANKLE;  Surgeon: Newt Minion, MD;  Location: [facility];  Service: Orthopedics;  Laterality: Left;  . HERNIA REPAIR    . LEFT AND RIGHT HEART CATHETERIZATION WITH CORONARY ANGIOGRAM N/A 06/19/2014   Procedure: LEFT AND RIGHT HEART CATHETERIZATION WITH CORONARY ANGIOGRAM;  Surgeon: Peter M Martinique, MD;  Location: [facility] C[facility] LAB;  Service: Cardiovascular;  Laterality: N/A;  . ORIF ANKLE FRACTURE Left 10/25/2017   Procedure: OPEN REDUCTION INTERNAL FIXATION (ORIF) LEFT ANKLE FRACTURE;  Surgeon: Newt Minion, MD;  Location: [facility];  Service: Orthopedics;  Laterality: Left;  . TEE WITHOUT CARDIOVERSION N/A 02/23/2018   Procedure: TRANSESOPHAGEAL ECHOCARDIOGRAM (TEE);  Surgeon: Dorothy Spark, MD;  Location: [facility] ENDOSCOPY;  Service: Cardiovascular;  Laterality: N/A;  . TONSILLECTOMY          Home Medications    Prior to Admission medications   Medication Sig Start Date End Date Taking? Authorizing Provider  acetaminophen (TYLENOL) 325 MG tablet  Take 2 tablets (650 mg total) by mouth every 6 (six) hours as needed for mild pain, moderate pain or headache. 03/29/18   Hongalgi, Lenis Dickinson, MD  Amino Acids-Protein Hydrolys (FEEDING SUPPLEMENT, PRO-STAT SUGAR FREE 64,) LIQD Take 30 mLs by mouth daily.    [provider]  amiodarone (PACERONE) 200 MG tablet Take 2 tablets (400 mg total) by mouth as directed. BEGINNING ON 05/13/18 DECREASE TO 200 MG DAILY 04/24/18   [name] T, PA-C  apixaban (ELIQUIS) 2.5 MG TABS tablet Take 1 tablet (2.5 mg total) by mouth 2 (two) times daily. 05/11/18   [name] T, PA-C  atorvastatin (LIPITOR) 40 MG tablet Take 1 tablet (40 mg total) by mouth daily at 6 PM. 03/29/18   Hongalgi, Lenis Dickinson, MD  calcium carbonate (OS-CAL) 600 MG TABS tablet Take  600 mg by mouth daily with breakfast.    [provider]  Dermatological Products, Misc. (MOISTURE BARRIER) OINT Apply 1 application topically 3 (three) times daily. To buttocks/sacrum    [provider]  furosemide (LASIX) 40 MG tablet Take 1.5 tablets (60 mg total) by mouth 2 (two) times daily. 07/17/18 10/15/18  Lyda Jester M, PA-C  glimepiride (AMARYL) 1 MG tablet Take 1 mg by mouth 3 times daily with meals, bedtime and 2 AM.    [provider]  Multiple Vitamin (MULTIVITAMIN WITH MINERALS) TABS tablet Take 1 tablet by mouth daily.    [provider]  omeprazole (PRILOSEC) 20 MG capsule Take 20 mg by mouth every evening.    [provider]  saxagliptin HCl (ONGLYZA) 5 MG TABS tablet Take 0.5 tablets (2.5 mg total) by mouth daily. 12/13/17   Velvet Bathe, MD    Family History Family History  Problem Relation Age of Onset  . Colon cancer Mother     Social History Social History   Tobacco Use  . Smoking status: Never Smoker  . Smokeless tobacco: Never Used  Substance Use Topics  . Alcohol use: No  . Drug use: No     Allergies   Brilinta [ticagrelor]; Clindamycin/lincomycin; Doxycycline;  Penicillins; and Sulfa antibiotics   Review of Systems Review of Systems  Constitutional: Positive for fatigue.  Gastrointestinal: Positive for blood in stool.  Neurological: Positive for weakness.  All other systems reviewed and are negative.    Physical Exam Updated Vital Signs BP (!) 133/98   Pulse 70   Temp (!) 96.2 F (35.7 C) (Temporal)   Resp 16   Ht 5' 10"  (1.778 m)   Wt 102.5 kg   SpO2 92%   BMI 32.43 kg/m   Physical Exam  Constitutional: He is oriented to person, place, and time.  Chronically ill, slightly pale   HENT:  Head: Normocephalic.  Eyes: Pupils are equal, round, and reactive to light. EOM are normal.  Conjunctiva slightly pale   Neck: Normal range of motion. Neck supple.  Cardiovascular: Normal rate, regular rhythm and normal heart sounds.  Pulmonary/Chest: Effort normal and breath sounds normal. No stridor. No respiratory distress.  Abdominal: Soft. Bowel sounds are normal. He exhibits no distension. There is no tenderness.  Colostomy with brown stool. Complicated surgical scars that are healing well, nontender   Musculoskeletal: Normal range of motion.  Neurological: He is alert and oriented to person, place, and time. No cranial nerve deficit. Coordination normal.  Skin: Skin is warm.  Psychiatric: He has a normal mood and affect.  Nursing note and vitals reviewed.    ED Treatments / Results  Labs (all labs ordered are listed, but only abnormal results are displayed) Labs Reviewed  CBC WITH DIFFERENTIAL/PLATELET - Abnormal; Notable for the following components:      Result Value   RBC 3.35 (*)    Hemoglobin 8.1 (*)    HCT 28.0 (*)    MCH 24.2 (*)    MCHC 28.9 (*)    RDW 15.8 (*)    All other components within normal limits  COMPREHENSIVE METABOLIC PANEL - Abnormal; Notable for the following components:   Glucose, Bld 143 (*)    BUN 49 (*)    Creatinine, Ser 2.10 (*)    Albumin 2.9 (*)    GFR calc non Af Amer 27 (*)    GFR calc Af  Amer 32 (*)    All other components within normal limits  BRAIN NATRIURETIC PEPTIDE - Abnormal; Notable for the following components:   B Natriuretic Peptide 188.6 (*)    All other components within normal limits  IRON AND TIBC - Abnormal; Notable for the following components:   Iron 21 (*)    Saturation Ratios 6 (*)    All other components within normal limits  FERRITIN - Abnormal; Notable for the following components:   Ferritin 17 (*)    All other components within normal limits  RETICULOCYTES - Abnormal; Notable for the following components:   RBC. 3.35 (*)    Immature Retic Fract 27.2 (*)    All other components within normal limits  URINALYSIS, ROUTINE W REFLEX MICROSCOPIC - Abnormal; Notable for the following components:   APPearance HAZY (*)    Hgb urine dipstick LARGE (*)    Leukocytes, UA LARGE (*)    WBC, UA >50 (*)    Bacteria, UA FEW (*)    All other components within normal limits  POC OCCULT BLOOD, ED - Abnormal; Notable for the following components:   Fecal Occult Bld POSITIVE (*)    All other components within normal limits  URINE CULTURE  CULTURE, BLOOD (ROUTINE X 2)  CULTURE, BLOOD (ROUTINE X 2)  VITAMIN B12  FOLATE  I-STAT TROPONIN, ED  I-STAT CG4 LACTIC ACID, ED  TYPE AND SCREEN    EKG None  Radiology Dg Chest Port 1 View  Result Date: 07/26/2018 CLINICAL DATA:  Weakness EXAM: PORTABLE CHEST 1 VIEW COMPARISON:  03/26/2018 FINDINGS: Chronic cardiomegaly. Aortic tortuosity. Mild, generalized interstitial coarsening. There is no edema, consolidation, effusion, or pneumothorax. IMPRESSION: 1. No acute finding when compared to priors. 2. Cardiomegaly. Electronically Signed   By: Monte Fantasia M.D.   On: 07/26/2018 12:19    Procedures Procedures (including critical care time)  Medications Ordered in ED Medications  sodium chloride 0.9 % bolus 500 mL (has no administration in time range)  cefTRIAXone (ROCEPHIN) 1 g in sodium chloride 0.9 % 100 mL  IVPB (has no administration in time range)     Initial Impression / Assessment and Plan / ED Course  I have reviewed the triage vital signs and the nursing notes.  Pertinent labs & imaging results that were available during my care of the patient were reviewed by me and considered in my medical decision making (see chart for details).    Nicolaos UTSEY is a 84 y.o. male here with weakness, near syncope. Patient was anemic with Hg 7.7 on 10/8. Patient had a near syncopal episode in the office and was transiently hypotensive. BP 130s in the ED. Consider infection vs symptomatic anemia vs ACS. Will get labs and hydrate and likely need admission.   2:55 PM Hg 8.1, improved from 10/8. WBC nl. UA + UTI. CXR clear. I think near syncope likely from UTI. He is guiac positive but has brown stool and Hg stable. I called Dr. Blanch Media PA from [facility]. They will see patient. Hospitalist to admit for near syncope, UTI, anemia.   Final Clinical Impressions(s) / ED Diagnoses   Final diagnoses:  Symptomatic anemia  Near syncope    ED Discharge Orders    None       Drenda Freeze, MD 07/26/18 1459

## 2018-07-26 NOTE — Telephone Encounter (Signed)
Called [facility], spoke with Santa Genera, Director of Nursing, to verify that the orders was received to d/c pt's Eliquis. She confirmed that she did receive it. She did want to update you that pt is at the ER right now and we will await to see what happens.  She did state that they had on pts record that he was on Eliquis 5 mg bid, and after reviewing, seems like the decrease in dosage, 05/11/18, was never handled at their facility. She mentioned she will do some research back in the pts chart and let us know if she could find the communication / documentation.

## 2018-07-26 NOTE — ED Notes (Signed)
Admitting provider at bedside. States OK to starte pt on Cardiac Diet. [place] sandwich and a water given at this time.

## 2018-07-26 NOTE — ED Notes (Signed)
Sisters at bedside. Pt comfortable.

## 2018-07-26 NOTE — Consult Note (Signed)
[facility] Consult: 2:04 PM 07/26/2018  LOS: 0 days    Referring Provider: Dr Darl Householder  Primary Care Physician:  [name], MD Primary Gastroenterologist:  Dr Henrene Pastor     Reason for Consultation:  Anemia.  FOBT +.     HPI: [name] [name] is a 84 y.o. male.  Obese diabetic.  Hx ulcerative colitis. S/p subtotal colectomy 1985, further and complete resection, ileostomy and closure of rectal stump 1992.  CAD.  DES placed 2015.  On 81 ASA. Parox A fib; previously deemed a non-candidate for anticoagulation due to hx anemia and blood loss. Restarted Eliquis 02/2018 during admission for Afib/RVR and need for TEE/DCCV.  EF 40%.  Diastolic dysfx.  Pulmonary htn. CKD 3.  Left ankle surgery 06/2425 with complications, required hardware removal 11/2017. 03/2018 traumatic pelvic fracture, did not require surgery.  Probable OSA.   Patient says that in the 1960s he was told he had an ulcer.    EGD 2009: Normal study.  No cause found for transfusion requiring, FOBT + anemia.  Dr Carlean Purl suspected multifactorial anemia  11/2014 EGD for FOBT +, IDA.  Dr Henrene Pastor.  Multiple gastric erosions as likely source.  Clo test negative.  Patient was to continue on daily PPI indefinitely and oral iron supplementation. However pt not getting po iron but does take Omeprazole 20 mg/day.    In 02/2018, Dr Silverio Decamp OK'd restart of AC/Eliquis at time of TEE/DCCV 02/23/18.  By 03/2018 he was back in a fib, Amiodarone increased; NSR in 04/2018. NSR at [facility] of 9/30.  Chronic exertional dyspnea and LE edema.  Pro BNP elevated 9/30.  Hgb lower.   Advised to see PMD.  At office today he was asked to walk around the room to check sats, felt week and had syncopal spell (pt himself denies LOC) and SBPs to 70s, in 120s by arrival to ED.         Hgbs in 03/2018: 8.8 -  10.3 On 9/30: 8 >> 10/3: 8 >> 10/8: 7.7 >> 8.1 today.   Stable CKD, BUN not elevated beyond normal.   and 10/3.    3 weeks ago began 1 week ago he had fevers from his stoma.  He was able to wipe the blood a course of about 5 to 10 minutes and stopped.  He is also had a couple of vigorous episodes of epistaxis which he has treated with Kleenex tamponade.   No abdominal pain, no nausea, no ataxia, no dysphagia.       Past Medical History:  Diagnosis Date  . Allergic reaction caused by a drug 11/2017  . Anemia   . Atrial flutter ([place])    a. s/p RFCA of counterclockwise cavo-tricuspid isthmus dependent atrial flutter 2009 by Dr. Rayann Heman.  Marland Kitchen CAD (coronary artery disease) 06/2014   a. 06/2014: abnl nuc. Cath: Totally occluded LAD with faint collaterals (not a candidate for CTO PCI), 90% ramus s/p DES, 50-70% OM2.  . Chronic diastolic CHF (congestive heart failure) ([place])    a. 2D Echo 10/23/13: EF 50-55%, basal mid-inf  HK, grade 2 DD, mild MR, mod dilated LA, no sig change from prior.  . CKD (chronic kidney disease), stage III ([place])   . Colostomy in place [facility])   . Complication of anesthesia    " during my kidney stone surgery my heart went out of rhythm  . Compression fracture of L1 lumbar vertebra ([name])   . DCM (dilated cardiomyopathy) ([place]) 02/27/2018   EF 35-40% by TEE 02/2018 felt tachycardia mediated  . Diabetes ([place])    TYPE 2   . Dyslipidemia   . Dyspnea   . Frequent PVCs   . GERD (gastroesophageal reflux disease)   . History of blood transfusion   . History of kidney stones   . History of peptic ulcer disease   . HTN (hypertension)   . Hx of acute renal failure 01/02/10-3/24-11   due to hypovolemic shock, gastroenteritis and dehydration,hospitalized . Did requre a few days of dialysis. Cr at discharge was 1.8  . Kidney disease   . Kidney stone may 2009   Right hydronephrosis, S/P stone removal  . Lower back pain   . Macular degeneration   . OA (osteoarthritis) of hip   .  Obesity   . Osteopenia   . PAF (paroxysmal atrial fibrillation) ([name])    not on long term anticoagulation due to history of heme positive stools and anemia  . Shingles    episode  . Small bowel obstruction, partial ([place]) 2009   Episode  . Ulcerative colitis ([place])    a. s/p total colectomy remotely.    Past Surgical History:  Procedure Laterality Date  . BACK SURGERY    . CARDIAC CATHETERIZATION  06/19/2014  . CARDIOVERSION N/A 02/23/2018   Procedure: CARDIOVERSION;  Surgeon: Dorothy Spark, MD;  [facility] ENDOSCOPY;  Service: Cardiovascular;  Laterality: N/A;  . COLON RESECTION    . COLOSTOMY    . CORONARY ANGIOPLASTY    . CORONARY STENT PLACEMENT  06/19/2014   DES       dr Martinique  . HARDWARE REMOVAL Left 12/01/2017   Procedure: HARDWARE REMOVAL LEFT ANKLE;  Surgeon: Newt Minion, MD;  Location: [place];  Service: Orthopedics;  Laterality: Left;  . HERNIA REPAIR    . LEFT AND RIGHT HEART CATHETERIZATION WITH CORONARY ANGIOGRAM N/A 06/19/2014   Procedure: LEFT AND RIGHT HEART CATHETERIZATION WITH CORONARY ANGIOGRAM;  Surgeon: Peter M Martinique, MD;  Location: [facility] CATH LAB;  Service: Cardiovascular;  Laterality: N/A;  . ORIF ANKLE FRACTURE Left 10/25/2017   Procedure: OPEN REDUCTION INTERNAL FIXATION (ORIF) LEFT ANKLE FRACTURE;  Surgeon: Newt Minion, MD;  Location: [place];  Service: Orthopedics;  Laterality: Left;  . TEE WITHOUT CARDIOVERSION N/A 02/23/2018   Procedure: TRANSESOPHAGEAL ECHOCARDIOGRAM (TEE);  Surgeon: Dorothy Spark, MD;  Location: [facility] ENDOSCOPY;  Service: Cardiovascular;  Laterality: N/A;  . TONSILLECTOMY      Prior to Admission medications   Medication Sig Start Date End Date Taking? Authorizing Provider  acetaminophen (TYLENOL) 325 MG tablet Take 2 tablets (650 mg total) by mouth every 6 (six) hours as needed for mild pain, moderate pain or headache. 03/29/18   Hongalgi, Lenis Dickinson, MD  Amino Acids-Protein Hydrolys (FEEDING SUPPLEMENT, PRO-STAT SUGAR FREE 64,) LIQD  Take 30 mLs by mouth daily.    [provider]  amiodarone (PACERONE) 200 MG tablet Take 2 tablets (400 mg total) by mouth as directed. BEGINNING ON 05/13/18 DECREASE TO 200 MG DAILY 04/24/18   [name] T, PA-C  apixaban Arne [place])  2.5 MG TABS tablet Take 1 tablet (2.5 mg total) by mouth 2 (two) times daily. 05/11/18   [name] T, PA-C  atorvastatin (LIPITOR) 40 MG tablet Take 1 tablet (40 mg total) by mouth daily at 6 PM. 03/29/18   Hongalgi, Lenis Dickinson, MD  calcium carbonate (OS-CAL) 600 MG TABS tablet Take 600 mg by mouth daily with breakfast.    [provider]  Dermatological Products, Misc. (MOISTURE BARRIER) OINT Apply 1 application topically 3 (three) times daily. To buttocks/sacrum    [provider]  furosemide (LASIX) 40 MG tablet Take 1.5 tablets (60 mg total) by mouth 2 (two) times daily. 07/17/18 10/15/18  Lyda Jester M, PA-C  glimepiride (AMARYL) 1 MG tablet Take 1 mg by mouth 3 times daily with meals, bedtime and 2 AM.    [provider]  Multiple Vitamin (MULTIVITAMIN WITH MINERALS) TABS tablet Take 1 tablet by mouth daily.    [provider]  omeprazole (PRILOSEC) 20 MG capsule Take 20 mg by mouth every evening.    [provider]  saxagliptin HCl (ONGLYZA) 5 MG TABS tablet Take 0.5 tablets (2.5 mg total) by mouth daily. 12/13/17   Velvet Bathe, MD    Scheduled Meds:  Infusions: . cefTRIAXone (ROCEPHIN)  IV    . sodium chloride     PRN Meds:    Allergies as of 07/26/2018 - Review Complete 07/16/2018  Allergen Reaction Noted  . Brilinta [ticagrelor] Shortness Of Breath and Other (See Comments) 11/05/2014  . Clindamycin/lincomycin Anaphylaxis 12/11/2017  . Doxycycline Anaphylaxis 12/05/2017  . Penicillins Rash and Other (See Comments) 10/02/2013  . Sulfa antibiotics Other (See Comments) 10/02/2013    Family History  Problem Relation Age of Onset  . Colon cancer Mother     Social History   Socioeconomic  History  . Marital status: Widowed    Spouse name: Not on file  . Number of children: Not on file  . Years of education: Not on file  . Highest education level: Not on file  Occupational History  . Not on file  Social Needs  . Financial resource strain: Not on file  . Food insecurity:    Worry: Not on file    Inability: Not on file  . Transportation needs:    Medical: Not on file    Non-medical: Not on file  Tobacco Use  . Smoking status: Never Smoker  . Smokeless tobacco: Never Used  Substance and Sexual Activity  . Alcohol use: No  . Drug use: No  . Sexual activity: Not on file  Lifestyle  . Physical activity:    Days per week: Not on file    Minutes per session: Not on file  . Stress: Not on file  Relationships  . Social connections:    Talks on phone: Not on file    Gets together: Not on file    Attends religious service: Not on file    Active member of club or organization: Not on file    Attends meetings of clubs or organizations: Not on file    Relationship status: Not on file  . Intimate partner violence:    Fear of current or ex partner: Not on file    Emotionally abused: Not on file    Physically abused: Not on file    Forced sexual activity: Not on file  Other Topics Concern  . Not on file  Social History Narrative  . Not on file    REVIEW OF SYSTEMS:  Constitutional: Up until the episode of doctor's office today, the patient was not having any new fatigue or dyspnea.  In fact normally he walks 304 153 times a day at the nursing home.  It does wind but he is able to do it. ENT:  Per HPI Pulm:  Per HPI CV:  No palpitations, no chest pain.  Chronic lower extremity edema.  He says that wearing elastic stockings do not help at all. GU:  No hematuria, no frequency GI:  Per HPI Heme:  Per HPI   Transfusions:  In 2016.   Neuro:  No headaches, no peripheral tingling or numbness Derm:  No itching, no rash or sores.  Endocrine:  No sweats or chills.  No  polyuria or dysuria Immunization:  Not queried Travel:  None beyond local counties in last few months.    PHYSICAL EXAM: Vital signs in last 24 hours: Vitals:   07/26/18 1150  BP: (!) 133/98  Pulse: 70  Resp: 16  Temp: (!) 96.2 F (35.7 C)  SpO2: 92%   Wt Readings from Last 3 Encounters:  07/26/18 102.5 kg  07/16/18 105 kg  05/01/18 106.5 kg    General: Pale, somewhat ashen coloring with pallor.  Sitting up alert and comfortable.  Looks chronically unwell.  Obese Head: No hematuria.  No signs of head trauma. Eyes: Conjunctivae pale.  No scleral icterus Ears: Not hard Nose: No discharge or congestion Mouth: Tongue midline somewhat moist and clear. Neck: No JVD, thyromegaly. Lungs: Clear bilaterally.  No Heart: RRR.  So far in the 70s on heart monitor. Abdomen: Obese.  Soft.  Nontender.  Ostomy, pink plastic.  Unable to visualize the stool did not ostomy to do so.  Also able to visualize the stoma well but there is no gross blood there..   Rectal: Deferred Musc/Skeltl: No joint swelling, redness or significant deformity. Extremities: Significant lower extremity edema with some pitting. Neurologic: Alert.  Oriented x3.  Good, detailed historian.  No tremors or gross deficits.  Moves all 4 limbs, strength not tested. Skin: Some hyperpigmentation in the skin of her arms consistent with previous, resolving purpura/hematoma Nodes: No cervical adenopathy. Psych: Pleasant, cooperative.  Speech a bit low but clear.  Intake/Output from previous day: No intake/output data recorded. Intake/Output this shift: No intake/output data recorded.  LAB RESULTS: Recent Labs    07/24/18 1030 07/26/18 1147  WBC 7.8 9.2  HGB 7.7* 8.1*  HCT 24.7* 28.0*  PLT 303 316   BMET Lab Results  Component Value Date   NA 136 07/26/2018   NA 138 07/24/2018   NA 136 07/19/2018   K 4.2 07/26/2018   K 4.4 07/24/2018   K 4.6 07/19/2018   CL 101 07/26/2018   CL 100 07/24/2018   CL 96  07/19/2018   CO2 23 07/26/2018   CO2 20 07/24/2018   CO2 21 07/19/2018   GLUCOSE 143 (H) 07/26/2018   GLUCOSE 152 (H) 07/24/2018   GLUCOSE 87 07/19/2018   BUN 49 (H) 07/26/2018   BUN 52 (H) 07/24/2018   BUN 53 (H) 07/19/2018   CREATININE 2.10 (H) 07/26/2018   CREATININE 2.08 (H) 07/24/2018   CREATININE 2.10 (H) 07/19/2018   CALCIUM 8.9 07/26/2018   CALCIUM 8.8 07/24/2018   CALCIUM 8.7 07/19/2018   LFT Recent Labs    07/24/18 1030 07/26/18 1147  PROT 6.4 7.1  ALBUMIN 3.2* 2.9*  AST 22 29  ALT 14 16  ALKPHOS 120* 108  BILITOT 0.3 0.5  PT/INR Lab Results  Component Value Date   INR 1.21 03/27/2018   INR 1.07 01/04/2017   INR 1.29 10/23/2014   Hepatitis Panel No results for input(s): HEPBSAG, HCVAB, HEPAIGM, HEPBIGM in the last 72 hours. C-Diff No components found for: CDIFF Lipase  No results found for: LIPASE  Drugs of Abuse     Component Value Date/Time   LABOPIA NONE DETECTED 01/04/2017 1229   COCAINSCRNUR NONE DETECTED 01/04/2017 1229   LABBENZ NONE DETECTED 01/04/2017 1229   AMPHETMU NONE DETECTED 01/04/2017 1229   THCU NONE DETECTED 01/04/2017 1229   LABBARB NONE DETECTED 01/04/2017 1229     RADIOLOGY STUDIES: Dg Chest Port 1 View  Result Date: 07/26/2018 CLINICAL DATA:  Weakness EXAM: PORTABLE CHEST 1 VIEW COMPARISON:  03/26/2018 FINDINGS: Chronic cardiomegaly. Aortic tortuosity. Mild, generalized interstitial coarsening. There is no edema, consolidation, effusion, or pneumothorax. IMPRESSION: 1. No acute finding when compared to priors. 2. Cardiomegaly. Electronically Signed   By: Monte Fantasia M.D.   On: 07/26/2018 12:19     IMPRESSION:   *   Chronic anemia.  Normocytic.  Deemed multifactorial in past years. Previous contribution of gastric erosions and associated blood loss when he was taking Plavix in 2016.  *   FOBT +.  2 recent episodes bleed at stoma and 2 incidences of epistaxis in last 3 weeks.  Gastric erosions on 2016 EGD  *   S/P  subtotal colectomy, ileostomy for UC remotely.  No UC meds. Bleeding from stoma as noted but stools O/w brown.   *   Uti???: not many bacteria but lots of WBCs, few RBCs, + leukocytes, no nitrites.  On rocephin.    *   Syncope.  troponins normal so far.    *   History of atrial fibrillation.  Successfully cardioverted 02/2018.  Has been in sinus rhythm on recent office visits and continues in NSR in the ED today. Chronic eliquis since 02/2018.   On hold   Last dose was 10/10 in AM.         PLAN:     *    Will discuss this with Dr. Tarri Glenn.   Consider EGD as well as investigation of the ostomy. Can have clear liquids now.   Azucena Freed  07/26/2018, 2:04 PM Phone [phone]

## 2018-07-26 NOTE — ED Notes (Signed)
Dr. Tarri Glenn at bedside

## 2018-07-26 NOTE — H&P (Signed)
History and Physical  [name] [name] RSW:[id number] DOB: [birth year] DOA: 07/26/2018  Referring physician: ER physician. PCP: Thornton Park, MD  Outpatient Specialists: Cardiology and primary care provider. Patient coming from: [facility].  The patient has been a resident since June of this year.  Chief Complaint: Weakness and dizziness.  HPI:  Patient is an 84 year old Caucasian male, with past medical history significant for ulcerative colitis for which patient underwent total colectomy and colostomy, partial small bowel obstruction, paroxysmal atrial fibrillation, obesity, osteoarthritis, nephrolithiasis, hypertension, PUD, GERD, diabetes mellitus, hyperlipidemia, dilated cardiomyopathy with EF of 35 to 40%, CKD 3, and coronary artery disease.  Echocardiogram done on 03/14/2018 revealed EF of 40 to 45%, mild LVH, grade 1 diastolic dysfunction, severely dilated left atrium, and moderately dilated right atrium with PA peak pressure 45 mmHg.  Patient has been on Eliquis for atrial fibrillation, but was noted to have withdrawal in H/H.  Lab work done on outpatient basis was reported to reveal hemoglobin of 7.1 g/dL.  Patient's cardiologist advised patient to go to the primary care provider for further work-up and management.  While at the PCPs office, patient developed weakness and dizziness, but denied having passed out.  Patient was advised to come to the hospital for further assessment and management.  No headache, no neck pain, no fever chills, no shortness of breath, no palpitations, no GI symptoms and no focal motor weakness.  Troponin is 0.01, cardiac BNP is 188.6, and hemoglobin on presentation was 8.1 g/dL.  Iron studies done revealed iron of 21, TIBC of 371 and ferritin of 17.  Hospitalist team has been asked to admit patient for further assessment and management.  ED Course: On presentation to the ER, stool was found to be guaiac positive.  Work-up is essentially as  above. Pertinent labs: Iron is as documented above.  BMP revealed sodium of 136, potassium of 4.2, chloride 101, CO2 of 23, BUN of 49 and serum creatinine of 2.10 blood sugar of 143.  UA is suggestive of likely UTI Imaging: independently reviewed.   Review of Systems:  Negative for fever, visual changes, sore throat, rash, new muscle aches, chest pain, SOB, dysuria, n/v/abdominal pain.  Patient reports feeling weak and dizzy.  Past Medical History:  Diagnosis Date  . Allergic reaction caused by a drug 11/2017  . Anemia   . Atrial flutter ([place])    a. s/p RFCA of counterclockwise cavo-tricuspid isthmus dependent atrial flutter 2009 by Dr. Rayann Heman.  Marland Kitchen CAD (coronary artery disease) 06/2014   a. 06/2014: abnl nuc. Cath: Totally occluded LAD with faint collaterals (not a candidate for CTO PCI), 90% ramus s/p DES, 50-70% OM2.  . Chronic diastolic CHF (congestive heart failure) ([place])    a. 2D Echo 10/23/13: EF 50-55%, basal mid-inf HK, grade 2 DD, mild MR, mod dilated LA, no sig change from prior.  . CKD (chronic kidney disease), stage III ([place])   . Colostomy in place [facility])   . Complication of anesthesia    " during my kidney stone surgery my heart went out of rhythm  . Compression fracture of L1 lumbar vertebra ([name])   . DCM (dilated cardiomyopathy) ([place]) 02/27/2018   EF 35-40% by TEE 02/2018 felt tachycardia mediated  . Diabetes ([place])    TYPE 2   . Dyslipidemia   . Dyspnea   . Frequent PVCs   . GERD (gastroesophageal reflux disease)   . History of blood transfusion   . History of kidney stones   .  History of peptic ulcer disease   . HTN (hypertension)   . Hx of acute renal failure 01/02/10-3/24-11   due to hypovolemic shock, gastroenteritis and dehydration,hospitalized . Did requre a few days of dialysis. Cr at discharge was 1.8  . Kidney disease   . Kidney stone may 2009   Right hydronephrosis, S/P stone removal  . Lower back pain   . Macular degeneration   . OA (osteoarthritis) of hip     . Obesity   . Osteopenia   . PAF (paroxysmal atrial fibrillation) ([name])    not on long term anticoagulation due to history of heme positive stools and anemia  . Shingles    episode  . Small bowel obstruction, partial ([place]) 2009   Episode  . Ulcerative colitis ([place])    a. s/p total colectomy remotely.    Past Surgical History:  Procedure Laterality Date  . BACK SURGERY    . CARDIAC CATHETERIZATION  06/19/2014  . CARDIOVERSION N/A 02/23/2018   Procedure: CARDIOVERSION;  Surgeon: Dorothy Spark, MD;  Location: [facility] ENDOSCOPY;  Service: Cardiovascular;  Laterality: N/A;  . COLON RESECTION    . COLOSTOMY    . CORONARY ANGIOPLASTY    . CORONARY STENT PLACEMENT  06/19/2014   DES       dr Martinique  . HARDWARE REMOVAL Left 12/01/2017   Procedure: HARDWARE REMOVAL LEFT ANKLE;  Surgeon: Newt Minion, MD;  Location: [place];  Service: Orthopedics;  Laterality: Left;  . HERNIA REPAIR    . LEFT AND RIGHT HEART CATHETERIZATION WITH CORONARY ANGIOGRAM N/A 06/19/2014   Procedure: LEFT AND RIGHT HEART CATHETERIZATION WITH CORONARY ANGIOGRAM;  Surgeon: Peter M Martinique, MD;  Location: [facility] CATH LAB;  Service: Cardiovascular;  Laterality: N/A;  . ORIF ANKLE FRACTURE Left 10/25/2017   Procedure: OPEN REDUCTION INTERNAL FIXATION (ORIF) LEFT ANKLE FRACTURE;  Surgeon: Newt Minion, MD;  Location: [facility];  Service: Orthopedics;  Laterality: Left;  . TEE WITHOUT CARDIOVERSION N/A 02/23/2018   Procedure: TRANSESOPHAGEAL ECHOCARDIOGRAM (TEE);  Surgeon: Dorothy Spark, MD;  Location: [facility] ENDOSCOPY;  Service: Cardiovascular;  Laterality: N/A;  . TONSILLECTOMY       reports that he has never smoked. He has never used smokeless tobacco. He reports that he does not drink alcohol or use drugs.  Allergies  Allergen Reactions  . Brilinta [Ticagrelor] Shortness Of Breath and Other (See Comments)    Changed to Plavix due to SOB  . Clindamycin/Lincomycin Anaphylaxis and Other (See Comments)    Whole body skin peeling,  blisters also  . Doxycycline Anaphylaxis  . Penicillins Rash and Other (See Comments)    Has patient had a PCN reaction causing immediate rash, facial/tongue/throat swelling, SOB or lightheadedness with hypotension: Yes Has patient had a PCN reaction causing severe rash involving mucus membranes or skin necrosis: No Has patient had a PCN reaction that required hospitalization: No Has patient had a PCN reaction occurring within the last 10 years: No If all of the above answers are "NO", then may proceed with Cephalosporin use.   Eual Fines [Brassica Oleracea Italica]     Negatively affects colostomy  . Sulfa Antibiotics Other (See Comments)    "Allergic," per MAR  . Tape Rash    Skin is sensitive; please use an alternative to tape    Family History  Problem Relation Age of Onset  . Colon cancer Mother      Prior to Admission medications   Medication Sig Start Date End Date Taking? Authorizing  Provider  acetaminophen (TYLENOL) 325 MG tablet Take 2 tablets (650 mg total) by mouth every 6 (six) hours as needed for mild pain, moderate pain or headache. 03/29/18  Yes Hongalgi, Lenis Dickinson, MD  Amino Acids-Protein Hydrolys (FEEDING SUPPLEMENT, PRO-STAT SUGAR FREE 64,) LIQD Take 30 mLs by mouth daily.   Yes [provider]  amiodarone (PACERONE) 200 MG tablet Take 2 tablets (400 mg total) by mouth as directed. BEGINNING ON 05/13/18 DECREASE TO 200 MG DAILY Patient taking differently: Take 200 mg by mouth daily.  04/24/18  Yes Weaver, Scott T, PA-C  atorvastatin (LIPITOR) 40 MG tablet Take 1 tablet (40 mg total) by mouth daily at 6 PM. 03/29/18  Yes Hongalgi, Lenis Dickinson, MD  calcium carbonate (OS-CAL) 600 MG TABS tablet Take 600 mg by mouth daily with breakfast.   Yes [provider]  Dermatological Products, Misc. (MOISTURE BARRIER) OINT Apply 1 application topically 3 (three) times daily. To buttocks/sacrum   Yes [provider]  docusate sodium (COLACE) 100 MG capsule Take  100 mg by mouth 2 (two) times daily.   Yes [provider]  furosemide (LASIX) 40 MG tablet Take 1.5 tablets (60 mg total) by mouth 2 (two) times daily. 07/17/18 10/15/18 Yes Simmons, Brittainy M, PA-C  glimepiride (AMARYL) 1 MG tablet Take 1 mg by mouth 2 (two) times daily with a meal.    Yes [provider]  Multiple Vitamin (MULTIVITAMIN WITH MINERALS) TABS tablet Take 1 tablet by mouth daily.   Yes [provider]  omeprazole (PRILOSEC) 20 MG capsule Take 20 mg by mouth every evening.   Yes [provider]  saxagliptin HCl (ONGLYZA) 2.5 MG TABS tablet Take 2.5 mg by mouth daily.   Yes [provider]  apixaban (ELIQUIS) 2.5 MG TABS tablet Take 1 tablet (2.5 mg total) by mouth 2 (two) times daily. Patient not taking: Reported on 07/26/2018 05/11/18   [name] T, PA-C  saxagliptin HCl (ONGLYZA) 5 MG TABS tablet Take 0.5 tablets (2.5 mg total) by mouth daily. Patient not taking: Reported on 07/26/2018 12/13/17   Velvet Bathe, MD    Physical Exam: Vitals:   07/26/18 1400 07/26/18 1430 07/26/18 1515 07/26/18 1516  BP: (!) 128/52 (!) 137/58 129/63   Pulse: 70 78    Resp: 20 (!) 21 15   Temp:      TempSrc:      SpO2: 97% 96%  100%  Weight:      Height:        Constitutional:  . Appears calm and comfortable Eyes:  . Pallor. No jaundice.  ENMT:  . external ears, nose appear normal Neck:  . Neck is supple. No JVD Respiratory:  . CTA bilaterally, no w/r/r.  . Respiratory effort normal. No retractions or accessory muscle use Cardiovascular:  . S1S2 . Fullness of the lower extremities, with edema.  Size of the lower extremities is significantly increased, but noted to be chronic.    Abdomen:  . Abdomen is Peace, soft and non tender, with colostomy bag in place. Organs are difficult to assess. Neurologic:  . Awake and alert. . Moves all limbs.  Wt Readings from Last 3 Encounters:  07/26/18 102.5 kg  07/16/18 105 kg  05/01/18 106.5  kg    I have personally reviewed following labs and imaging studies  Labs on Admission:  CBC: Recent Labs  Lab 07/24/18 1030 07/26/18 1147  WBC 7.8 9.2  NEUTROABS  --  6.6  HGB 7.7* 8.1*  HCT 24.7* 28.0*  MCV 80 83.6  PLT 303 320   Basic Metabolic Panel: Recent Labs  Lab 07/24/18 1030 07/26/18 1147  NA 138 136  K 4.4 4.2  CL 100 101  CO2 20 23  GLUCOSE 152* 143*  BUN 52* 49*  CREATININE 2.08* 2.10*  CALCIUM 8.8 8.9   Liver Function Tests: Recent Labs  Lab 07/24/18 1030 07/26/18 1147  AST 22 29  ALT 14 16  ALKPHOS 120* 108  BILITOT 0.3 0.5  PROT 6.4 7.1  ALBUMIN 3.2* 2.9*   No results for input(s): LIPASE, AMYLASE in the last 168 hours. No results for input(s): AMMONIA in the last 168 hours. Coagulation Profile: No results for input(s): INR, PROTIME in the last 168 hours. Cardiac Enzymes: No results for input(s): CKTOTAL, CKMB, CKMBINDEX, TROPONINI in the last 168 hours. BNP (last 3 results) Recent Labs    07/16/18 1539 07/19/18 1406  PROBNP 1,130* 1,351*   HbA1C: No results for input(s): HGBA1C in the last 72 hours. CBG: No results for input(s): GLUCAP in the last 168 hours. Lipid Profile: No results for input(s): CHOL, HDL, LDLCALC, TRIG, CHOLHDL, LDLDIRECT in the last 72 hours. Thyroid Function Tests: No results for input(s): TSH, T4TOTAL, FREET4, T3FREE, THYROIDAB in the last 72 hours. Anemia Panel: Recent Labs    07/24/18 1030 07/26/18 1147  VITAMINB12  --  372  FOLATE  --  42.6  FERRITIN  --  17*  TIBC 322 371  IRON 21* 21*  RETICCTPCT  --  2.4   Urine analysis:    Component Value Date/Time   COLORURINE YELLOW 07/26/2018 1148   APPEARANCEUR HAZY (A) 07/26/2018 1148   LABSPEC 1.009 07/26/2018 1148   PHURINE 5.0 07/26/2018 1148   GLUCOSEU NEGATIVE 07/26/2018 1148   HGBUR LARGE (A) 07/26/2018 1148   BILIRUBINUR NEGATIVE 07/26/2018 [place] 07/26/2018 1148   PROTEINUR NEGATIVE 07/26/2018 1148   UROBILINOGEN 0.2  10/22/2014 1052   NITRITE NEGATIVE 07/26/2018 1148   LEUKOCYTESUR LARGE (A) 07/26/2018 1148   Sepsis Labs: @LABRCNTIP (procalcitonin:4,lacticidven:4) )No results found for this or any previous visit (from the past 240 hour(s)).    Radiological Exams on Admission: Dg Chest Port 1 View  Result Date: 07/26/2018 CLINICAL DATA:  Weakness EXAM: PORTABLE CHEST 1 VIEW COMPARISON:  03/26/2018 FINDINGS: Chronic cardiomegaly. Aortic tortuosity. Mild, generalized interstitial coarsening. There is no edema, consolidation, effusion, or pneumothorax. IMPRESSION: 1. No acute finding when compared to priors. 2. Cardiomegaly. Electronically Signed   By: Monte Fantasia M.D.   On: 07/26/2018 12:19   Active Problems:   * No active hospital problems. *   Assessment/Plan Symptomatic and anemia: Type and crossmatch 1 unit of packed red blood cells Transfuse 1 unit of red blood cells Continue to monitor H/H IV iron x1 dose Ferrous sulfate orally GI team is been consulted Protonix famotidine Management depend on hospital course.   Weakness and dizziness: Likely secondary to above. EKG TSH Telemetry monitoring Low threshold to proceed with further work-up. PT consult.  Iron deficiency anemia: See above GI input is awaited Likely related to GI blood loss  Likely UTI: Urine culture IV Rocephin Further management depend on hospital course  History of ulcerative colitis status post total colectomy: Stable for now Need to monitor symptoms  Atrial fibrillation on Eliquis up until this morning: Rate controlled Eliquis on hold due to GI bleed  Likely upper GI bleed/Oozing: Patient was on Eliquis. Continue to monitor. GI consulted Low threshold to also consult cardiology  team. Further management depend on hospital course.  CKD 3: Stable.  Chronic combined systolic and diastolic congestive heart failure: Stable Continue current medication. Results of last echocardiogram  noted.  Pulmonary hypertension: Seems stable.  Hypertension: Continue to optimize. Continue current medications.  DVT prophylaxis: SCD Code Status: Full Family Communication:  Disposition Plan: Discharge back to assisted living facility eventually Consults called: Gastroenterology  Admission status: Observation  Time spent: 65 minutes  Dana Allan, MD  [facility] Pager #: [phone] 7PM-7AM contact night coverage as above  07/26/2018, 4:33 PM

## 2018-07-27 DIAGNOSIS — I130 Hypertensive heart and chronic kidney disease with heart failure and stage 1 through stage 4 chronic kidney disease, or unspecified chronic kidney disease: Secondary | ICD-10-CM | POA: Diagnosis not present

## 2018-07-27 DIAGNOSIS — E1122 Type 2 diabetes mellitus with diabetic chronic kidney disease: Secondary | ICD-10-CM | POA: Diagnosis present

## 2018-07-27 DIAGNOSIS — I10 Essential (primary) hypertension: Secondary | ICD-10-CM | POA: Diagnosis not present

## 2018-07-27 DIAGNOSIS — Z9049 Acquired absence of other specified parts of digestive tract: Secondary | ICD-10-CM | POA: Diagnosis not present

## 2018-07-27 DIAGNOSIS — E669 Obesity, unspecified: Secondary | ICD-10-CM | POA: Diagnosis present

## 2018-07-27 DIAGNOSIS — K219 Gastro-esophageal reflux disease without esophagitis: Secondary | ICD-10-CM | POA: Diagnosis present

## 2018-07-27 DIAGNOSIS — K3189 Other diseases of stomach and duodenum: Secondary | ICD-10-CM | POA: Diagnosis not present

## 2018-07-27 DIAGNOSIS — I5022 Chronic systolic (congestive) heart failure: Secondary | ICD-10-CM

## 2018-07-27 DIAGNOSIS — I420 Dilated cardiomyopathy: Secondary | ICD-10-CM | POA: Diagnosis not present

## 2018-07-27 DIAGNOSIS — I480 Paroxysmal atrial fibrillation: Secondary | ICD-10-CM | POA: Diagnosis not present

## 2018-07-27 DIAGNOSIS — E0821 Diabetes mellitus due to underlying condition with diabetic nephropathy: Secondary | ICD-10-CM | POA: Diagnosis not present

## 2018-07-27 DIAGNOSIS — E785 Hyperlipidemia, unspecified: Secondary | ICD-10-CM | POA: Diagnosis present

## 2018-07-27 DIAGNOSIS — I5042 Chronic combined systolic (congestive) and diastolic (congestive) heart failure: Secondary | ICD-10-CM | POA: Diagnosis not present

## 2018-07-27 DIAGNOSIS — D500 Iron deficiency anemia secondary to blood loss (chronic): Secondary | ICD-10-CM | POA: Diagnosis not present

## 2018-07-27 DIAGNOSIS — Z881 Allergy status to other antibiotic agents status: Secondary | ICD-10-CM | POA: Diagnosis not present

## 2018-07-27 DIAGNOSIS — K922 Gastrointestinal hemorrhage, unspecified: Secondary | ICD-10-CM

## 2018-07-27 DIAGNOSIS — K259 Gastric ulcer, unspecified as acute or chronic, without hemorrhage or perforation: Secondary | ICD-10-CM | POA: Diagnosis not present

## 2018-07-27 DIAGNOSIS — Z933 Colostomy status: Secondary | ICD-10-CM | POA: Diagnosis not present

## 2018-07-27 DIAGNOSIS — D649 Anemia, unspecified: Secondary | ICD-10-CM | POA: Diagnosis not present

## 2018-07-27 DIAGNOSIS — Z955 Presence of coronary angioplasty implant and graft: Secondary | ICD-10-CM | POA: Diagnosis not present

## 2018-07-27 DIAGNOSIS — D62 Acute posthemorrhagic anemia: Secondary | ICD-10-CM | POA: Diagnosis not present

## 2018-07-27 DIAGNOSIS — I2510 Atherosclerotic heart disease of native coronary artery without angina pectoris: Secondary | ICD-10-CM | POA: Diagnosis not present

## 2018-07-27 DIAGNOSIS — I4892 Unspecified atrial flutter: Secondary | ICD-10-CM | POA: Diagnosis not present

## 2018-07-27 DIAGNOSIS — Z6832 Body mass index (BMI) 32.0-32.9, adult: Secondary | ICD-10-CM | POA: Diagnosis not present

## 2018-07-27 DIAGNOSIS — K250 Acute gastric ulcer with hemorrhage: Secondary | ICD-10-CM | POA: Diagnosis not present

## 2018-07-27 DIAGNOSIS — Z882 Allergy status to sulfonamides status: Secondary | ICD-10-CM | POA: Diagnosis not present

## 2018-07-27 DIAGNOSIS — I509 Heart failure, unspecified: Secondary | ICD-10-CM | POA: Diagnosis not present

## 2018-07-27 DIAGNOSIS — N184 Chronic kidney disease, stage 4 (severe): Secondary | ICD-10-CM | POA: Diagnosis not present

## 2018-07-27 DIAGNOSIS — K2950 Unspecified chronic gastritis without bleeding: Secondary | ICD-10-CM | POA: Diagnosis not present

## 2018-07-27 DIAGNOSIS — Z888 Allergy status to other drugs, medicaments and biological substances status: Secondary | ICD-10-CM | POA: Diagnosis not present

## 2018-07-27 DIAGNOSIS — Z79899 Other long term (current) drug therapy: Secondary | ICD-10-CM | POA: Diagnosis not present

## 2018-07-27 DIAGNOSIS — H3530 Unspecified macular degeneration: Secondary | ICD-10-CM | POA: Diagnosis present

## 2018-07-27 DIAGNOSIS — Z880 Allergy status to penicillin: Secondary | ICD-10-CM | POA: Diagnosis not present

## 2018-07-27 LAB — BASIC METABOLIC PANEL
Anion gap: 8 (ref 5–15)
BUN: 39 mg/dL — ABNORMAL HIGH (ref 8–23)
CO2: 26 mmol/L (ref 22–32)
Calcium: 8.7 mg/dL — ABNORMAL LOW (ref 8.9–10.3)
Chloride: 105 mmol/L (ref 98–111)
Creatinine, Ser: 1.6 mg/dL — ABNORMAL HIGH (ref 0.61–1.24)
GFR calc Af Amer: 44 mL/min — ABNORMAL LOW (ref >60)
GFR calc non Af Amer: 38 mL/min — ABNORMAL LOW (ref >60)
Glucose, Bld: 117 mg/dL — ABNORMAL HIGH (ref 70–99)
Potassium: 3.6 mmol/L (ref 3.5–5.1)
Sodium: 139 mmol/L (ref 135–145)

## 2018-07-27 LAB — GLUCOSE, CAPILLARY
Glucose-Capillary: 112 mg/dL — ABNORMAL HIGH (ref 70–99)
Glucose-Capillary: 130 mg/dL — ABNORMAL HIGH (ref 70–99)
Glucose-Capillary: 245 mg/dL — ABNORMAL HIGH (ref 70–99)

## 2018-07-27 LAB — CBC
HCT: 27.2 % — ABNORMAL LOW (ref 39.0–52.0)
Hemoglobin: 8 g/dL — ABNORMAL LOW (ref 13.0–17.0)
MCH: 24.5 pg — ABNORMAL LOW (ref 26.0–34.0)
MCHC: 29.4 g/dL — ABNORMAL LOW (ref 30.0–36.0)
MCV: 83.4 fL (ref 80.0–100.0)
Platelets: 280 10*3/uL (ref 150–400)
RBC: 3.26 MIL/uL — ABNORMAL LOW (ref 4.22–5.81)
RDW: 15.3 % (ref 11.5–15.5)
WBC: 7.7 10*3/uL (ref 4.0–10.5)
nRBC: 0 % (ref 0.0–0.2)

## 2018-07-27 LAB — FECAL OCCULT BLOOD, IMMUNOCHEMICAL: Fecal Occult Bld: POSITIVE — AB

## 2018-07-27 MED ORDER — SODIUM CHLORIDE 0.9 % IV SOLN
1.0000 g | Freq: Every day | INTRAVENOUS | Status: DC
  Administered 2018-07-27: 1 g via INTRAVENOUS
  Filled 2018-07-27: qty 10

## 2018-07-27 NOTE — Clinical Social Work Note (Signed)
Clinical Social Work Assessment  Patient Details  Name: [name] [name] MRN: [id number] Date of Birth: [birth year]  Date of referral:  07/27/18               Reason for consult:  Facility Placement                Permission sought to share information with:  Facility Sport and exercise psychologist, Family Supports Permission granted to share information::  Yes, Verbal Permission Granted  Name::        Agency::  [facility]  Relationship::     Contact Information:     Housing/Transportation Living arrangements for the past 2 months:  [place] of Information:  Patient Patient Interpreter Needed:  None Criminal Activity/Legal Involvement Pertinent to Current Situation/Hospitalization:  No - Comment as needed Significant Relationships:  Siblings Lives with:  Facility Resident Do you feel safe going back to the place where you live?  Yes Need for family participation in patient care:  No (Coment)  Care giving concerns:  CSW received consult regarding discharge planning. Patient reports that he resides at [place] and he will return there at discharge. CSW to continue to follow and assist with discharge planning needs.   Social Worker assessment / plan:  CSW spoke with patient concerning discharge plan.   Employment status:  Retired Forensic scientist:  Medicare PT Recommendations:  Home with [place] / Referral to community resources:     Patient/Family's Response to care:  Patient recognizes need for rehab at ALF and can receive home health PT at the facility. He will require PTAR if ALF is unable to transport him.   Patient/Family's Understanding of and Emotional Response to Diagnosis, Current Treatment, and Prognosis:  Patient/family is realistic regarding therapy needs and expressed being hopeful for feeling better and return to ALF placement. Patient expressed understanding of CSW role and discharge process as well as medical  condition. No questions/concerns about plan or treatment.    Emotional Assessment Appearance:  Appears stated age Attitude/Demeanor/Rapport:  Self-Confident Affect (typically observed):  Accepting, Appropriate Orientation:  Oriented to Self, Oriented to Place, Oriented to  Time, Oriented to Situation Alcohol / Substance use:  Not Applicable Psych involvement (Current and /or in the community):  No (Comment)  Discharge Needs  Concerns to be addressed:  Care Coordination Readmission within the last 30 days:  No Current discharge risk:  None Barriers to Discharge:  Continued Medical Work up   [employer], LCSW 07/27/2018, 2:35 PM

## 2018-07-27 NOTE — H&P (View-Only) (Signed)
Daily Rounding Note  07/27/2018, 12:31 PM  LOS: 0 days   SUBJECTIVE:   Chief complaint: Anemia, weakness.  He feels pretty good.  He walked about 50 feet in the hallway    He emptied his ostomy bag last night and it was dark, he did not see the color of the stool it was liquid which it always is.  No abdominal pain.  No nausea.  He is hungry.  OBJECTIVE:         Vital signs in last 24 hours:    Temp:  [97.7 F (36.5 C)-97.9 F (36.6 C)] 97.8 F (36.6 C) (10/11 0557) Pulse Rate:  [65-122] 65 (10/11 0557) Resp:  [11-21] 18 (10/11 0557) BP: (101-137)/(37-63) 121/42 (10/11 0557) SpO2:  [96 %-100 %] 99 % (10/11 0557) Weight:  [102.5 kg] 102.5 kg (10/10 1233) Last BM Date: 07/26/18 Filed Weights   07/26/18 1233  Weight: 102.5 kg   General: Looks about the same.  Pleasant, alert, sitting comfortably in bed.  Looks chronically ill and pale. Heart: RRR. Chest: No labored breathing. Abdomen: Soft.  Not tender or distended.  Ostomy in place on right. Extremities: Positive lower extremity edema. Neuro/Psych: Pleasant, oriented x3.  Fully alert.  Moves all 4 limbs.  Intake/Output from previous day: 10/10 0701 - 10/11 0700 In: 938 [I.V.:500; Blood:288; IV Piggyback:150] Out: 1000 [Urine:1000]  Intake/Output this shift: No intake/output data recorded.  Lab Results: Recent Labs    07/26/18 1147 07/26/18 2131 07/27/18 0706  WBC 9.2 7.8 7.7  HGB 8.1* 7.4* 8.0*  HCT 28.0* 25.8* 27.2*  PLT 316 314 280   BMET Recent Labs    07/26/18 1147 07/27/18 0706  NA 136 139  K 4.2 3.6  CL 101 105  CO2 23 26  GLUCOSE 143* 117*  BUN 49* 39*  CREATININE 2.10* 1.60*  CALCIUM 8.9 8.7*   LFT Recent Labs    07/26/18 1147  PROT 7.1  ALBUMIN 2.9*  AST 29  ALT 16  ALKPHOS 108  BILITOT 0.5   PT/INR No results for input(s): LABPROT, INR in the last 72 hours. Hepatitis Panel No results for input(s): HEPBSAG, HCVAB,  HEPAIGM, HEPBIGM in the last 72 hours.  Studies/Results: Dg Chest Port 1 View  Result Date: 07/26/2018 CLINICAL DATA:  Weakness EXAM: PORTABLE CHEST 1 VIEW COMPARISON:  03/26/2018 FINDINGS: Chronic cardiomegaly. Aortic tortuosity. Mild, generalized interstitial coarsening. There is no edema, consolidation, effusion, or pneumothorax. IMPRESSION: 1. No acute finding when compared to priors. 2. Cardiomegaly. Electronically Signed   By: Monte Fantasia M.D.   On: 07/26/2018 12:19    ASSESMENT:   * Chronic anemia. Normocytic. Deemed multifactorial in past years. Previous contribution of gastric erosions and associated blood loss 2016.   Received Feraheme infusion yesterday.  Was not taking oral iron at the nursing home but this has been started as of yesterday.  *   FOBT +.  2 recent episodes bleed at stoma and 2 incidences of epistaxis in last 3 weeks.  Gastric erosions on 2016 EGD  * S/P subtotal colectomy, ileostomy for UC remotely.  No UC meds. Bleeding from stoma as noted but stools O/w brown.   *   Uti???: not many bacteria but lots of WBCs, few RBCs, + leukocytes, no nitrites.  On rocephin.    *   Syncope.  troponins normal so far.    * History of atrial fibrillation.  Successfully cardioverted 02/2018.  Has been in sinus  rhythm on recent office visits and continues in NSR in the ED today. Chronic eliquis since 02/2018.   On hold   Last dose was 10/10 in AM    PLAN   *    EGD and scope via ostomy will possibly go today.  If they do not go today than he can eat dinner and up until midnight and undergo the procedures tomorrow  *  Stopped IV pepcid, not necessary given BID PO Protonix in place Stopped oral iron, it is not immediately necessary and is present in the stomach and distal intestinal tract could be confused for old blood.  Seeing as how he is going to be undergoing EGD and investigation of the ostomy.  Prudent to hold this until after all endoscopic studies have  been completed    Azucena Freed  07/27/2018, 12:31 PM Phone [phone]

## 2018-07-27 NOTE — Progress Notes (Signed)
[name] [name] is a 84 y.o. male patient admitted from ED awake, alert - oriented  X 4 - no acute distress noted.  VSS - Blood pressure (!) 121/42, pulse 65, temperature 97.8 F (36.6 C), temperature source Oral, resp. rate 18, height 5' 10"  (1.778 m), weight 102.5 kg, SpO2 99 %.    IV in place, occlusive dsg intact without redness.  Orientation to room, and floor completed with information packet given to patient.  Admission INP armband ID verified with patient, and in place.   Fall assessment complete, with patient and family able to verbalize understanding of risk associated with falls, and verbalized understanding to call nsg before up out of bed.  Call light within reach, patient able to voice, and demonstrate understanding.  No evidence of skin break down noted on exam.     Will cont to eval and treat per MD orders.  Valorie Roosevelt, RN 07/27/2018 6:13 AM

## 2018-07-27 NOTE — Evaluation (Addendum)
Physical Therapy Evaluation Patient Details Name: [name] [name] MRN: [id number] DOB: 1933/08/13 Today's Date: 07/27/2018   History of Present Illness  Patient is an 84 year old Caucasian male, with past medical history significant for ulcerative colitis for which patient underwent total colectomy and colostomy, partial small bowel obstruction, paroxysmal atrial fibrillation, obesity, osteoarthritis, nephrolithiasis, hypertension, PUD, GERD, diabetes mellitus, hyperlipidemia, dilated cardiomyopathy with EF of 35 to 40%, CKD 3, and coronary artery disease.  Echocardiogram done on 03/14/2018 revealed EF of 40 to 45%, mild LVH, grade 1 diastolic dysfunction, severely dilated left atrium, and moderately dilated right atrium with PA peak pressure 45 mmHg.  While at the PCPs office, patient developed weakness and dizziness, but denied having passed out.  Patient was advised to come to the hospital for further assessment and management.  plan for EGD today.   Clinical Impression  Pt admitted with above diagnosis. Pt currently with functional limitations due to the deficits listed below (see PT Problem List). Pt was able to ambulate with min assist due to decr postural stability and need for steadying assist when fatigues. Pt uses his electric wheelchair at facility most per pt.  Will be able to return to A living when ready.   Will follow acutely.   Pt will benefit from skilled PT to increase their independence and safety with mobility to allow discharge to the venue listed below.      Follow Up Recommendations Home health PT;Supervision - Intermittent    Equipment Recommendations  None recommended by PT    Recommendations for Other Services       Precautions / Restrictions Precautions Precautions: Fall Restrictions Weight Bearing Restrictions: No      Mobility  Bed Mobility Overal bed mobility: Needs Assistance Bed Mobility: Supine to Sit     Supine to sit: Min guard     General bed  mobility comments: No assist needed.   Transfers Overall transfer level: Needs assistance Equipment used: Rolling walker (2 wheeled) Transfers: Sit to/from Stand Sit to Stand: Min guard         General transfer comment: No physical asssit needed.   Ambulation/Gait Ambulation/Gait assistance: Min assist Gait Distance (Feet): 110 Feet Assistive device: Rolling walker (2 wheeled) Gait Pattern/deviations: Step-through pattern;Decreased stride length;Trunk flexed;Wide base of support;Drifts right/left   Gait velocity interpretation: <1.31 ft/sec, indicative of household ambulator General Gait Details: Pt initially not needing cues or assist but as he ambulated, needed cues and assist to stay close to RW.  He also had incr flexec posture as he fatigued.    Stairs            Wheelchair Mobility    Modified Rankin (Stroke Patients Only)       Balance Overall balance assessment: Needs assistance Sitting-balance support: No upper extremity supported;Feet supported Sitting balance-[name]: Good     Standing balance support: Bilateral upper extremity supported;During functional activity Standing balance-[name]: Poor Standing balance comment: reelies on UE support for balance                              Pertinent Vitals/Pain Pain Assessment: No/denies pain  VSS  Home Living Family/patient expects to be discharged to:: Assisted living Living Arrangements: Alone Available Help at Discharge: Family;Available PRN/intermittently Type of Home: Assisted living Home Access: Level entry     Home Layout: One level Home Equipment: Walker - 2 wheels;Walker - 4 wheels;Cane - single point;Bedside commode;Wheelchair - power;Transport chair;Grab bars -  toilet      Prior Function Level of Independence: Needs assistance   Gait / Transfers Assistance Needed: used RW outddoors vs. electric wheelchair Modif I  ADL's / Homemaking Assistance Needed: pt dresses  self, showers with staff assist Mon and thurs.  He sits in electric chair to empty colostomy because he loses balance if he doesn't sit to do so.  Uses urinal to urinate.    Comments: Drives     Hand Dominance   Dominant Hand: Right    Extremity/Trunk Assessment   Upper Extremity Assessment Upper Extremity Assessment: Defer to OT evaluation    Lower Extremity Assessment Lower Extremity Assessment: Generalized weakness    Cervical / Trunk Assessment Cervical / Trunk Assessment: Kyphotic  Communication   Communication: No difficulties  Cognition Arousal/Alertness: Awake/alert Behavior During Therapy: WFL for tasks assessed/performed Overall Cognitive Status: Within Functional Limits for tasks assessed                                        General Comments      Exercises     Assessment/Plan    PT Assessment Patient needs continued PT services  PT Problem List Decreased activity tolerance;Decreased balance;Decreased mobility;Decreased knowledge of use of DME;Decreased safety awareness;Decreased knowledge of precautions       PT Treatment Interventions DME instruction;Gait training;Functional mobility training;Therapeutic activities;Therapeutic exercise;Balance training;Patient/family education    PT Goals (Current goals can be found in the Care Plan section)  Acute Rehab PT Goals Patient Stated Goal: to get better PT Goal Formulation: With patient Time For Goal Achievement: 08/10/18 Potential to Achieve Goals: Good    Frequency Min 3X/week   Barriers to discharge Decreased caregiver support      Co-evaluation               AM-PAC PT "6 Clicks" Daily Activity  Outcome Measure Difficulty turning over in bed (including adjusting bedclothes, sheets and blankets)?: None Difficulty moving from lying on back to sitting on the side of the bed? : None Difficulty sitting down on and standing up from a chair with arms (e.g., wheelchair, bedside  commode, etc,.)?: A Little Help needed moving to and from a bed to chair (including a wheelchair)?: A Little Help needed walking in hospital room?: A Little Help needed climbing 3-5 steps with a railing? : A Little 6 Click Score: 20    End of Session Equipment Utilized During Treatment: Gait belt Activity Tolerance: Patient tolerated treatment well Patient left: in bed;with call bell/phone within reach;with bed alarm set Nurse Communication: Mobility status PT Visit Diagnosis: Muscle weakness (generalized) (M62.81);Unsteadiness on feet (R26.81)    Time: 9201-0071 PT Time Calculation (min) (ACUTE ONLY): 23 min   Charges:   PT Evaluation $PT Eval Moderate Complexity: 1 Mod PT Treatments $Gait Training: 8-22 mins        [place] Pager:  [phone]  Office:  [place] 07/27/2018, 1:21 PM

## 2018-07-27 NOTE — Progress Notes (Signed)
Daily Rounding Note  07/27/2018, 12:31 PM  LOS: 0 days   SUBJECTIVE:   Chief complaint: Anemia, weakness.  He feels pretty good.  He walked about 50 feet in the hallway    He emptied his ostomy bag last night and it was dark, he did not see the color of the stool it was liquid which it always is.  No abdominal pain.  No nausea.  He is hungry.  OBJECTIVE:         Vital signs in last 24 hours:    Temp:  [97.7 F (36.5 C)-97.9 F (36.6 C)] 97.8 F (36.6 C) (10/11 0557) Pulse Rate:  [65-122] 65 (10/11 0557) Resp:  [11-21] 18 (10/11 0557) BP: (101-137)/(37-63) 121/42 (10/11 0557) SpO2:  [96 %-100 %] 99 % (10/11 0557) Weight:  [102.5 kg] 102.5 kg (10/10 1233) Last BM Date: 07/26/18 Filed Weights   07/26/18 1233  Weight: 102.5 kg   General: Looks about the same.  Pleasant, alert, sitting comfortably in bed.  Looks chronically ill and pale. Heart: RRR. Chest: No labored breathing. Abdomen: Soft.  Not tender or distended.  Ostomy in place on right. Extremities: Positive lower extremity edema. Neuro/Psych: Pleasant, oriented x3.  Fully alert.  Moves all 4 limbs.  Intake/Output from previous day: 10/10 0701 - 10/11 0700 In: 938 [I.V.:500; Blood:288; IV Piggyback:150] Out: 1000 [Urine:1000]  Intake/Output this shift: No intake/output data recorded.  Lab Results: Recent Labs    07/26/18 1147 07/26/18 2131 07/27/18 0706  WBC 9.2 7.8 7.7  HGB 8.1* 7.4* 8.0*  HCT 28.0* 25.8* 27.2*  PLT 316 314 280   BMET Recent Labs    07/26/18 1147 07/27/18 0706  NA 136 139  K 4.2 3.6  CL 101 105  CO2 23 26  GLUCOSE 143* 117*  BUN 49* 39*  CREATININE 2.10* 1.60*  CALCIUM 8.9 8.7*   LFT Recent Labs    07/26/18 1147  PROT 7.1  ALBUMIN 2.9*  AST 29  ALT 16  ALKPHOS 108  BILITOT 0.5   PT/INR No results for input(s): LABPROT, INR in the last 72 hours. Hepatitis Panel No results for input(s): HEPBSAG, HCVAB,  HEPAIGM, HEPBIGM in the last 72 hours.  Studies/Results: Dg Chest Port 1 View  Result Date: 07/26/2018 CLINICAL DATA:  Weakness EXAM: PORTABLE CHEST 1 VIEW COMPARISON:  03/26/2018 FINDINGS: Chronic cardiomegaly. Aortic tortuosity. Mild, generalized interstitial coarsening. There is no edema, consolidation, effusion, or pneumothorax. IMPRESSION: 1. No acute finding when compared to priors. 2. Cardiomegaly. Electronically Signed   By: Monte Fantasia M.D.   On: 07/26/2018 12:19    ASSESMENT:   * Chronic anemia. Normocytic. Deemed multifactorial in past years. Previous contribution of gastric erosions and associated blood loss 2016.   Received Feraheme infusion yesterday.  Was not taking oral iron at the nursing home but this has been started as of yesterday.  *   FOBT +.  2 recent episodes bleed at stoma and 2 incidences of epistaxis in last 3 weeks.  Gastric erosions on 2016 EGD  * S/P subtotal colectomy, ileostomy for UC remotely.  No UC meds. Bleeding from stoma as noted but stools O/w brown.   *   Uti???: not many bacteria but lots of WBCs, few RBCs, + leukocytes, no nitrites.  On rocephin.    *   Syncope.  troponins normal so far.    * History of atrial fibrillation.  Successfully cardioverted 02/2018.  Has been in sinus  rhythm on recent office visits and continues in NSR in the ED today. Chronic eliquis since 02/2018.   On hold   Last dose was 10/10 in AM    PLAN   *    EGD and scope via ostomy will possibly go today.  If they do not go today than he can eat dinner and up until midnight and undergo the procedures tomorrow  *  Stopped IV pepcid, not necessary given BID PO Protonix in place Stopped oral iron, it is not immediately necessary and is present in the stomach and distal intestinal tract could be confused for old blood.  Seeing as how he is going to be undergoing EGD and investigation of the ostomy.  Prudent to hold this until after all endoscopic studies have  been completed    Azucena Freed  07/27/2018, 12:31 PM Phone [phone]

## 2018-07-27 NOTE — Progress Notes (Signed)
PROGRESS NOTE        PATIENT DETAILS Name: [name] [name] Age: 84 y.o. Sex: male Date of Birth: [birth year] Admit Date: 07/26/2018 Admitting Physician Bonnell Public, MD JJH:ERDEYCX, Joelene Millin, MD  Brief Narrative: Patient is a 84 y.o. male with history of ulcerative colitis status post colectomy with colostomy in place, PAF on Eliquis, CAD status post PCI, combined systolic and diastolic heart failure, hypertension-presenting with weakness/dizziness, found to have a hemoglobin of 8.1, she was subsequently admitted for management and evaluation of symptomatic anemia.  Concern for GI bleeding with acute blood loss anemia.  See below for further details.  Subjective: Lying comfortably in bed-no chest pain or shortness of breath.  Inquiring about timing of endoscopic evaluation.  Assessment/Plan: GI bleeding with subacute blood loss anemia: Continue PPI-GI following with plans for EGD and scope via ostomy when schedule permits.  Transfused 1 unit of PRBC, hemoglobin 8.0 this morning.  We will continue to follow closely.  Do not see any overt bleeding this morning.  Paroxysmal atrial fibrillation: Rate controlled with amiodarone and Eliquis on hold due to concern for GI bleeding.  Will need to complete endoscopic work-up before we can resume Eliquis in the setting.  CAD-status post PCI: Does not appear to be on antiplatelet agents in the outpatient setting-likely due to patient being on Eliquis.  Continue statin.  Chronic combined systolic and diastolic heart failure: Appears euvolemic, continue Lasix  CKD stage IV: Creatinine close to usual baseline.  DM-2: Hold all oral hypoglycemic agents-CBGs currently stable with SSI.  ?  UTI: Patient does not have any clinical features of UTI-stop all antibiotics.  Ulcerative colitis status post total colectomy and colostomy   DVT Prophylaxis: SCD's  Code Status: Full code   Family Communication: None at  bedside  Disposition Plan: Remain inpatient-home when work-up is complete.  Antimicrobial agents: Anti-infectives (From admission, onward)   Start     Dose/Rate Route Frequency Ordered Stop   07/27/18 1000  cefTRIAXone (ROCEPHIN) 1 g in sodium chloride 0.9 % 100 mL IVPB     1 g 200 mL/hr over 30 Minutes Intravenous Daily 07/27/18 0054     07/26/18 1400  cefTRIAXone (ROCEPHIN) 1 g in sodium chloride 0.9 % 100 mL IVPB     1 g 200 mL/hr over 30 Minutes Intravenous  Once 07/26/18 1347 07/26/18 1618      Procedures: None  CONSULTS:  GI  Time spent: 25- minutes-Greater than 50% of this time was spent in counseling, explanation of diagnosis, planning of further management, and coordination of care.  MEDICATIONS: Scheduled Meds: . sodium chloride   Intravenous Once  . amiodarone  200 mg Oral Daily  . atorvastatin  40 mg Oral q1800  . calcium carbonate  1,250 mg Oral Q breakfast  . docusate sodium  100 mg Oral BID  . feeding supplement (PRO-STAT SUGAR FREE 64)  30 mL Oral Daily  . furosemide  60 mg Oral BID  . glimepiride  1 mg Oral BID WC  . insulin aspart  0-5 Units Subcutaneous QHS  . insulin aspart  0-9 Units Subcutaneous TID WC  . linagliptin  5 mg Oral Daily  . multivitamin with minerals  1 tablet Oral Daily  . pantoprazole  40 mg Oral BID  . Zinc Oxide  1 application Topical TID   Continuous Infusions: . cefTRIAXone (ROCEPHIN)  IV 1 g (07/27/18 1008)   PRN Meds:.   PHYSICAL EXAM: Vital signs: Vitals:   07/27/18 0007 07/27/18 0038 07/27/18 0248 07/27/18 0557  BP: (!) 112/45 (!) 122/37 (!) 101/42 (!) 121/42  Pulse:    65  Resp: 16 16 15 18   Temp: 97.7 F (36.5 C) 97.9 F (36.6 C) 97.7 F (36.5 C) 97.8 F (36.6 C)  TempSrc: Oral Oral Oral Oral  SpO2: 100% 100% 99% 99%  Weight:      Height:       Filed Weights   07/26/18 1233  Weight: 102.5 kg   Body mass index is 32.43 kg/m.   General appearance :Awake, alert, not in any distress. Speech Clear.   Eyes:Pink conjunctiva HEENT: Atraumatic and Normocephalic Neck: supple, no JVD. No cervical lymphadenopathy.  Resp:Good air entry bilaterally, no added sounds  CVS: S1 S2 regular GI: Bowel sounds present, Non tender and not distended with no gaurding, rigidity or rebound. Extremities: B/L Lower Ext shows trace edema, both legs are warm to touch Neurology:  speech clear,Non focal, sensation is grossly intact. Musculoskeletal:No digital cyanosis Skin:No Rash, warm and dry Wounds:N/A  I have personally reviewed following labs and imaging studies  LABORATORY DATA: CBC: Recent Labs  Lab 07/24/18 1030 07/26/18 1147 07/26/18 2131 07/27/18 0706  WBC 7.8 9.2 7.8 7.7  NEUTROABS  --  6.6  --   --   HGB 7.7* 8.1* 7.4* 8.0*  HCT 24.7* 28.0* 25.8* 27.2*  MCV 80 83.6 81.9 83.4  PLT 303 316 314 528    Basic Metabolic Panel: Recent Labs  Lab 07/24/18 1030 07/26/18 1147 07/26/18 2131 07/27/18 0706  NA 138 136  --  139  K 4.4 4.2  --  3.6  CL 100 101  --  105  CO2 20 23  --  26  GLUCOSE 152* 143*  --  117*  BUN 52* 49*  --  39*  CREATININE 2.08* 2.10*  --  1.60*  CALCIUM 8.8 8.9  --  8.7*  MG  --   --  1.8  --   PHOS  --   --  3.9  --     GFR: Estimated Creatinine Clearance: 42 mL/min (A) (by C-G formula based on SCr of 1.6 mg/dL (H)).  Liver Function Tests: Recent Labs  Lab 07/24/18 1030 07/26/18 1147  AST 22 29  ALT 14 16  ALKPHOS 120* 108  BILITOT 0.3 0.5  PROT 6.4 7.1  ALBUMIN 3.2* 2.9*   No results for input(s): LIPASE, AMYLASE in the last 168 hours. No results for input(s): AMMONIA in the last 168 hours.  Coagulation Profile: No results for input(s): INR, PROTIME in the last 168 hours.  Cardiac Enzymes: No results for input(s): CKTOTAL, CKMB, CKMBINDEX, TROPONINI in the last 168 hours.  BNP (last 3 results) Recent Labs    07/16/18 1539 07/19/18 1406  PROBNP 1,130* 1,351*    HbA1C: No results for input(s): HGBA1C in the last 72  hours.  CBG: Recent Labs  Lab 07/26/18 2316 07/27/18 0818 07/27/18 1206  GLUCAP 104* 112* 130*    Lipid Profile: No results for input(s): CHOL, HDL, LDLCALC, TRIG, CHOLHDL, LDLDIRECT in the last 72 hours.  Thyroid Function Tests: Recent Labs    07/26/18 2131  TSH 1.345    Anemia Panel: Recent Labs    07/26/18 1147  VITAMINB12 372  FOLATE 42.6  FERRITIN 17*  TIBC 371  IRON 21*  RETICCTPCT 2.4    Urine analysis:  Component Value Date/Time   COLORURINE YELLOW 07/26/2018 1148   APPEARANCEUR HAZY (A) 07/26/2018 1148   LABSPEC 1.009 07/26/2018 1148   PHURINE 5.0 07/26/2018 1148   GLUCOSEU NEGATIVE 07/26/2018 1148   HGBUR LARGE (A) 07/26/2018 1148   BILIRUBINUR NEGATIVE 07/26/2018 1148   KETONESUR NEGATIVE 07/26/2018 1148   PROTEINUR NEGATIVE 07/26/2018 1148   UROBILINOGEN 0.2 10/22/2014 1052   NITRITE NEGATIVE 07/26/2018 1148   LEUKOCYTESUR LARGE (A) 07/26/2018 1148    Sepsis Labs: Lactic Acid, Venous    Component Value Date/Time   LATICACIDVEN 1.55 07/26/2018 1506    MICROBIOLOGY: Recent Results (from the past 240 hour(s))  Urine culture     Status: Abnormal (Preliminary result)   Collection Time: 07/26/18 12:11 PM  Result Value Ref Range Status   Specimen Description URINE, RANDOM  Final   Special Requests   Final    NONE Performed at [facility], [place] [address], [place], [place] [address]    Culture 80,000 COLONIES/mL GRAM NEGATIVE RODS (A)  Final   Report Status PENDING  Incomplete    RADIOLOGY STUDIES/RESULTS: Dg Chest Port 1 View  Result Date: 07/26/2018 CLINICAL DATA:  Weakness EXAM: PORTABLE CHEST 1 VIEW COMPARISON:  03/26/2018 FINDINGS: Chronic cardiomegaly. Aortic tortuosity. Mild, generalized interstitial coarsening. There is no edema, consolidation, effusion, or pneumothorax. IMPRESSION: 1. No acute finding when compared to priors. 2. Cardiomegaly. Electronically Signed   By: Monte Fantasia M.D.   On: 07/26/2018 12:19      LOS: 0 days   Oren Binet, MD  [facility]  If 7PM-7AM, please contact night-coverage  Please page via [email or web address]-click on MD name and type text message  07/27/2018, 1:25 PM

## 2018-07-28 ENCOUNTER — Encounter (HOSPITAL_COMMUNITY)
Admission: EM | Disposition: A | Discharge status: Nursing Home (Includes ICF) | Payer: Self-pay | Source: Home / Self Care | Attending: Internal Medicine

## 2018-07-28 ENCOUNTER — Inpatient Hospital Stay (HOSPITAL_COMMUNITY): Payer: Medicare Other | Admitting: Anesthesiology

## 2018-07-28 ENCOUNTER — Encounter (HOSPITAL_COMMUNITY): Payer: Self-pay | Admitting: *Deleted

## 2018-07-28 DIAGNOSIS — E0821 Diabetes mellitus due to underlying condition with diabetic nephropathy: Secondary | ICD-10-CM

## 2018-07-28 DIAGNOSIS — N184 Chronic kidney disease, stage 4 (severe): Secondary | ICD-10-CM

## 2018-07-28 DIAGNOSIS — K250 Acute gastric ulcer with hemorrhage: Principal | ICD-10-CM

## 2018-07-28 HISTORY — PX: ESOPHAGOGASTRODUODENOSCOPY (EGD) WITH PROPOFOL: SHX5813

## 2018-07-28 HISTORY — PX: BIOPSY: SHX5522

## 2018-07-28 LAB — CBC
HCT: 31 % — ABNORMAL LOW (ref 39.0–52.0)
Hemoglobin: 8.8 g/dL — ABNORMAL LOW (ref 13.0–17.0)
MCH: 23.8 pg — ABNORMAL LOW (ref 26.0–34.0)
MCHC: 28.4 g/dL — ABNORMAL LOW (ref 30.0–36.0)
MCV: 84 fL (ref 80.0–100.0)
Platelets: 295 10*3/uL (ref 150–400)
RBC: 3.69 MIL/uL — ABNORMAL LOW (ref 4.22–5.81)
RDW: 15.5 % (ref 11.5–15.5)
WBC: 8.6 10*3/uL (ref 4.0–10.5)
nRBC: 0 % (ref 0.0–0.2)

## 2018-07-28 LAB — TYPE AND SCREEN
ABO/RH(D): A POS
Antibody Screen: NEGATIVE
Unit division: 0

## 2018-07-28 LAB — BPAM RBC
Blood Product Expiration Date: 201910172359
ISSUE DATE / TIME: 201910110015
Unit Type and Rh: 6200

## 2018-07-28 LAB — URINE CULTURE: Culture: 80000 — AB

## 2018-07-28 LAB — BASIC METABOLIC PANEL
Anion gap: 7 (ref 5–15)
BUN: 32 mg/dL — ABNORMAL HIGH (ref 8–23)
CO2: 26 mmol/L (ref 22–32)
Calcium: 8.7 mg/dL — ABNORMAL LOW (ref 8.9–10.3)
Chloride: 105 mmol/L (ref 98–111)
Creatinine, Ser: 1.88 mg/dL — ABNORMAL HIGH (ref 0.61–1.24)
GFR calc Af Amer: 36 mL/min — ABNORMAL LOW (ref >60)
GFR calc non Af Amer: 31 mL/min — ABNORMAL LOW (ref >60)
Glucose, Bld: 129 mg/dL — ABNORMAL HIGH (ref 70–99)
Potassium: 4.3 mmol/L (ref 3.5–5.1)
Sodium: 138 mmol/L (ref 135–145)

## 2018-07-28 LAB — GLUCOSE, CAPILLARY
Glucose-Capillary: 108 mg/dL — ABNORMAL HIGH (ref 70–99)
Glucose-Capillary: 123 mg/dL — ABNORMAL HIGH (ref 70–99)
Glucose-Capillary: 124 mg/dL — ABNORMAL HIGH (ref 70–99)

## 2018-07-28 SURGERY — ESOPHAGOGASTRODUODENOSCOPY (EGD) WITH PROPOFOL
Anesthesia: Monitor Anesthesia Care

## 2018-07-28 MED ORDER — LACTATED RINGERS IV SOLN
INTRAVENOUS | Status: DC
  Administered 2018-07-28: 07:00:00 via INTRAVENOUS

## 2018-07-28 MED ORDER — PANTOPRAZOLE SODIUM 40 MG PO TBEC: 40.0000 mg | DELAYED_RELEASE_TABLET | Freq: Two times a day (BID) | ORAL | 0 refills | Status: AC

## 2018-07-28 MED ORDER — SILVER NITRATE-POT NITRATE 75-25 % EX MISC
CUTANEOUS | Status: AC
  Filled 2018-07-28: qty 1

## 2018-07-28 MED ORDER — SODIUM CHLORIDE 0.9 % IV SOLN: INTRAVENOUS | Status: DC

## 2018-07-28 MED ORDER — APIXABAN 2.5 MG PO TABS: 2.5000 mg | ORAL_TABLET | Freq: Two times a day (BID) | ORAL | Status: DC

## 2018-07-28 MED ORDER — PHENYLEPHRINE 40 MCG/ML (10ML) SYRINGE FOR IV PUSH (FOR BLOOD PRESSURE SUPPORT)
PREFILLED_SYRINGE | INTRAVENOUS | Status: DC | PRN
  Administered 2018-07-28: 80 ug via INTRAVENOUS

## 2018-07-28 MED ORDER — PROPOFOL 500 MG/50ML IV EMUL
INTRAVENOUS | Status: DC | PRN
  Administered 2018-07-28: 100 ug/kg/min via INTRAVENOUS

## 2018-07-28 MED ORDER — SODIUM CHLORIDE 0.9 % IV SOLN
INTRAVENOUS | Status: DC | PRN
  Administered 2018-07-28: 08:00:00 via INTRAVENOUS

## 2018-07-28 SURGICAL SUPPLY — 15 items

## 2018-07-28 NOTE — Progress Notes (Signed)
Discharge to: [facility] Anticipated discharge date: 07/28/18 Transportation by: [facility]  Report #: [id number] [place])  [place] signing off.  Laveda Abbe LCSW [phone]

## 2018-07-28 NOTE — Anesthesia Procedure Notes (Signed)
Procedure Name: MAC Date/Time: 07/28/2018 7:42 AM Performed by: Kyung Rudd, CRNA Pre-anesthesia Checklist: Patient identified, Emergency Drugs available, Suction available and Patient being monitored Patient Re-evaluated:Patient Re-evaluated prior to induction Oxygen Delivery Method: Nasal cannula Induction Type: IV induction Placement Confirmation: positive ETCO2

## 2018-07-28 NOTE — Care Management Note (Signed)
Case Management Note  Patient Details  Name: [name] [name] MRN: [id number] Date of Birth: [birth year]  Subjective/Objective:    From [facility], for dc back today with HHPT, [place], NCM spoke with Dian Situ, rep with [facility], he said to make sure orders are in epic and patient should be ok to get therapy.                Action/Plan: DC when ready.  Expected Discharge Date:  07/28/18               Expected Discharge Plan:  Assisted Living / Rest Home  In-House Referral:  Clinical Social Work  Discharge planning Services  CM Consult  Post Acute Care Choice:    Choice offered to:     DME Arranged:    DME Agency:     HH Arranged:  PT, OT HH Agency:  [facility]  Status of Service:  Completed, signed off  If discussed at [place] of Stay Meetings, dates discussed:    Additional Comments:  Zenon Mayo, RN 07/28/2018, 10:07 AM

## 2018-07-28 NOTE — Interval H&P Note (Signed)
History and Physical Interval Note:  07/28/2018 7:42 AM  [name] [name]  has presented today for surgery, with the diagnosis of Anemia  The various methods of treatment have been discussed with the patient and family. After consideration of risks, benefits and other options for treatment, the patient has consented to  Procedure(s): ESOPHAGOGASTRODUODENOSCOPY (EGD) WITH PROPOFOL (N/A) as a surgical intervention .  The patient's history has been reviewed, patient examined, no change in status, stable for surgery.  I have reviewed the patient's chart and labs.  Questions were answered to the patient's satisfaction.     [place]

## 2018-07-28 NOTE — Discharge Summary (Signed)
PATIENT DETAILS Name: [name] [name] Age: 84 y.o. Sex: male Date of Birth: October 28, 1932 MRN: [id number]. Admitting Physician: Bonnell Public, MD [id number], Joelene Millin, MD  Admit Date: 07/26/2018 Discharge date: 07/28/2018  Recommendations for Outpatient Follow-up:  1. Follow up with PCP in 1-2 weeks 2. Please obtain BMP/CBC in one week 3. Please follow up on EGD biopsy results 4. Resume Eliquis on 10/13 5. Protonix twice daily for 2 months 6. Ensure follow-up with cardiology  Admitted From:  Home  Disposition: ALF    Home Health: Yes  Equipment/Devices: None  Discharge Condition: Stable  CODE STATUS: FULL CODE  Diet recommendation:  Heart Healthy / Carb Modified   Brief Summary: See H&P, Labs, Consult and Test reports for all details in brief, Patient is a 84 y.o. male with history of ulcerative colitis status post colectomy with colostomy in place, PAF on Eliquis, CAD status post PCI, combined systolic and diastolic heart failure, hypertension-presenting with weakness/dizziness, found to have a hemoglobin of 8.1, she was subsequently admitted for management and evaluation of symptomatic anemia.  Concern for GI bleeding with acute blood loss anemia.  See below for further details.  Brief Hospital Course: GI bleeding with subacute blood loss anemia: Started on PPI, transfused 1 unit of PRBC.  Seen by gastroenterology-underwent EGD on 10/12, showed one small nonbleeding gastric ulcer that was biopsied and results are currently pending.  Recommendations from gastroenterology are to continue with PPI twice daily for 2 months, to avoid NSAIDs/aspirin.  Okay to resume Eliquis starting on 10/13.  IMA globin stable at 8.8 on the day of discharge.  Stable to discharge back to ALF with home health-Long discussion with patient and sister at bedside this morning.    Paroxysmal atrial fibrillation: Rate controlled with amiodarone, as noted above Eliquis to be resumed on 10/13.   Patient and sister aware that patient needs to follow-up with his primary cardiologist-for continued care-and for continued risk/benefit assessment of long-term anticoagulation.  CAD-status post PCI: Does not appear to be on antiplatelet agents in the outpatient setting-likely due to patient being on Eliquis.  Continue statin.  Chronic combined systolic and diastolic heart failure: Appears euvolemic, continue Lasix  CKD stage IV: Creatinine close to usual baseline.  DM-2: All oral hypoglycemic agents were held-CBGs was stable with SSI-resume all oral hypoglycemic agents on discharge.  Please monitor CBGs at least 2-3 times a day while at ALF.   ?  UTI: Suspect patient has asymptomatic bacteriuria-he has no symptoms of UTI-afebrile-no leukocytosis-no indication to start antibiotics at this time even though he has positive urine cultures.   Ulcerative colitis status post total colectomy and colostomy  Procedures/Studies: 10/12>> EGD  Discharge Diagnoses:  Active Problems:   Symptomatic anemia   Acute gastric ulcer with hemorrhage  Discharge Instructions:  Activity:  As tolerated with Full fall precautions use walker/cane & assistance as needed  Discharge Instructions    Diet - low sodium heart healthy   Complete by:  As directed    Diet Carb Modified   Complete by:  As directed    Discharge instructions   Complete by:  As directed    Follow with Primary MD in 1 week  Follow with Dr Radford Pax in 1 week  Follow with Dr Henrene Pastor (GI) in 1 month  Please get a complete blood count and chemistry panel checked by your Primary MD at your next visit, and again as instructed by your Primary MD.  Please check CBG's 2-3 times a day.  Get Medicines reviewed and adjusted: Please take all your medications with you for your next visit with your Primary MD  Laboratory/radiological data: Please request your Primary MD to go over all hospital tests and procedure/radiological results at  the follow up, please ask your Primary MD to get all Hospital records sent to his/her office.  In some cases, they will be blood work, cultures and biopsy results pending at the time of your discharge. Please request that your primary care M.D. follows up on these results.  Also Note the following: If you experience worsening of your admission symptoms, develop shortness of breath, life threatening emergency, suicidal or homicidal thoughts you must seek medical attention immediately by calling 911 or calling your MD immediately  if symptoms less severe.  You must read complete instructions/literature along with all the possible adverse reactions/side effects for all the Medicines you take and that have been prescribed to you. Take any new Medicines after you have completely understood and accpet all the possible adverse reactions/side effects.   Do not drive when taking Pain medications or sleeping medications (Benzodaizepines)  Do not take more than prescribed Pain, Sleep and Anxiety Medications. It is not advisable to combine anxiety,sleep and pain medications without talking with your primary care practitioner  Special Instructions: If you have smoked or chewed Tobacco  in the last 2 yrs please stop smoking, stop any regular Alcohol  and or any Recreational drug use.  Wear Seat belts while driving.  Please note: You were cared for by a hospitalist during your hospital stay. Once you are discharged, your primary care physician will handle any further medical issues. Please note that NO REFILLS for any discharge medications will be authorized once you are discharged, as it is imperative that you return to your primary care physician (or establish a relationship with a primary care physician if you do not have one) for your post hospital discharge needs so that they can reassess your need for medications and monitor your lab values.   Increase activity slowly   Complete by:  As directed       Allergies as of 07/28/2018      Reactions   Brilinta [ticagrelor] Shortness Of Breath, Other (See Comments)   Changed to Plavix due to SOB   Clindamycin/lincomycin Anaphylaxis, Other (See Comments)   Whole body skin peeling, blisters also   Doxycycline Anaphylaxis   Penicillins Rash, Other (See Comments)   Has patient had a PCN reaction causing immediate rash, facial/tongue/throat swelling, SOB or lightheadedness with hypotension: Yes Has patient had a PCN reaction causing severe rash involving mucus membranes or skin necrosis: No Has patient had a PCN reaction that required hospitalization: No Has patient had a PCN reaction occurring within the last 10 years: No If all of the above answers are "NO", then may proceed with Cephalosporin use.   Broccoli [brassica Oleracea Italica]    Negatively affects colostomy   Sulfa Antibiotics Other (See Comments)   "Allergic," per MAR   Tape Rash   Skin is sensitive; please use an alternative to tape      Medication List    STOP taking these medications   omeprazole 20 MG capsule Commonly known as:  PRILOSEC Replaced by:  pantoprazole 40 MG tablet     TAKE these medications   acetaminophen 325 MG tablet Commonly known as:  TYLENOL Take 2 tablets (650 mg total) by mouth every 6 (six) hours as needed for mild pain, moderate pain or headache.  amiodarone 200 MG tablet Commonly known as:  PACERONE Take 2 tablets (400 mg total) by mouth as directed. BEGINNING ON 05/13/18 DECREASE TO 200 MG DAILY What changed:    how much to take  when to take this  additional instructions   apixaban 2.5 MG Tabs tablet Commonly known as:  ELIQUIS Take 1 tablet (2.5 mg total) by mouth 2 (two) times daily. Start taking on:  07/29/2018   atorvastatin 40 MG tablet Commonly known as:  LIPITOR Take 1 tablet (40 mg total) by mouth daily at 6 PM.   calcium carbonate 600 MG Tabs tablet Commonly known as:  OS-CAL Take 600 mg by mouth daily with  breakfast.   docusate sodium 100 MG capsule Commonly known as:  COLACE Take 100 mg by mouth 2 (two) times daily.   feeding supplement (PRO-STAT SUGAR FREE 64) Liqd Take 30 mLs by mouth daily.   furosemide 40 MG tablet Commonly known as:  LASIX Take 1.5 tablets (60 mg total) by mouth 2 (two) times daily.   glimepiride 1 MG tablet Commonly known as:  AMARYL Take 1 mg by mouth 2 (two) times daily with a meal.   MOISTURE BARRIER Oint Apply 1 application topically 3 (three) times daily. To buttocks/sacrum   multivitamin with minerals Tabs tablet Take 1 tablet by mouth daily.   pantoprazole 40 MG tablet Commonly known as:  PROTONIX Take 1 tablet (40 mg total) by mouth 2 (two) times daily. Replaces:  omeprazole 20 MG capsule   ONGLYZA 2.5 MG Tabs tablet Generic drug:  saxagliptin HCl Take 2.5 mg by mouth daily.   saxagliptin HCl 5 MG Tabs tablet Commonly known as:  ONGLYZA Take 0.5 tablets (2.5 mg total) by mouth daily.       Contact information for follow-up providers    Sueanne Margarita, MD. Schedule an appointment as soon as possible for a visit in 1 week(s).   Specialty:  Cardiology Contact information: [address] [place] [address] [phone]        Irene Shipper, MD. Schedule an appointment as soon as possible for a visit in 1 month(s).   Specialty:  Gastroenterology Contact information: [address] [place] [address] [phone]            Contact information for after-discharge care    Destination    [facility] .   Service:  Assisted Living Contact information: [place] [phone]                 Allergies  Allergen Reactions  . Brilinta [Ticagrelor] Shortness Of Breath and Other (See Comments)    Changed to Plavix due to SOB  . Clindamycin/Lincomycin Anaphylaxis and Other (See Comments)    Whole body skin peeling, blisters also  . Doxycycline  Anaphylaxis  . Penicillins Rash and Other (See Comments)    Has patient had a PCN reaction causing immediate rash, facial/tongue/throat swelling, SOB or lightheadedness with hypotension: Yes Has patient had a PCN reaction causing severe rash involving mucus membranes or skin necrosis: No Has patient had a PCN reaction that required hospitalization: No Has patient had a PCN reaction occurring within the last 10 years: No If all of the above answers are "NO", then may proceed with Cephalosporin use.   Eual Fines [Brassica Oleracea Italica]     Negatively affects colostomy  . Sulfa Antibiotics Other (See Comments)    "Allergic," per MAR  .  Tape Rash    Skin is sensitive; please use an alternative to tape    Consultations:   GI   Other Procedures/Studies: Dg Chest Port 1 View  Result Date: 07/26/2018 CLINICAL DATA:  Weakness EXAM: PORTABLE CHEST 1 VIEW COMPARISON:  03/26/2018 FINDINGS: Chronic cardiomegaly. Aortic tortuosity. Mild, generalized interstitial coarsening. There is no edema, consolidation, effusion, or pneumothorax. IMPRESSION: 1. No acute finding when compared to priors. 2. Cardiomegaly. Electronically Signed   By: Monte Fantasia M.D.   On: 07/26/2018 12:19      TODAY-DAY OF DISCHARGE:  Subjective:   [name] [name] today has no headache,no chest abdominal pain,no new weakness tingling or numbness, feels much better wants to go home today  Objective:   Blood pressure 134/63, pulse 69, temperature 97.6 F (36.4 C), temperature source Oral, resp. rate 20, height 5' 10"  (1.778 m), weight 102.5 kg, SpO2 100 %.  Intake/Output Summary (Last 24 hours) at 07/28/2018 0940 Last data filed at 07/28/2018 0802 Gross per 24 hour  Intake 1040 ml  Output 702 ml  Net 338 ml   Filed Weights   07/26/18 1233 07/28/18 0715  Weight: 102.5 kg 102.5 kg    Exam: Awake Alert, Oriented *3, No new F.N deficits, Normal affect [place].AT,PERRAL Supple Neck,No JVD, No cervical  lymphadenopathy appriciated.  Symmetrical Chest wall movement, Good air movement bilaterally, CTAB RRR,No Gallops,Rubs or new Murmurs, No Parasternal Heave +ve B.Sounds, Abd Soft, Non tender, No organomegaly appriciated, No rebound -guarding or rigidity. No Cyanosis, Clubbing or edema, No new Rash or bruise   PERTINENT RADIOLOGIC STUDIES: Dg Chest Port 1 View  Result Date: 07/26/2018 CLINICAL DATA:  Weakness EXAM: PORTABLE CHEST 1 VIEW COMPARISON:  03/26/2018 FINDINGS: Chronic cardiomegaly. Aortic tortuosity. Mild, generalized interstitial coarsening. There is no edema, consolidation, effusion, or pneumothorax. IMPRESSION: 1. No acute finding when compared to priors. 2. Cardiomegaly. Electronically Signed   By: Monte Fantasia M.D.   On: 07/26/2018 12:19     PERTINENT LAB RESULTS: CBC: Recent Labs    07/27/18 0706 07/28/18 0335  WBC 7.7 8.6  HGB 8.0* 8.8*  HCT 27.2* 31.0*  PLT 280 295   CMET CMP     Component Value Date/Time   NA 138 07/28/2018 0335   NA 138 07/24/2018 1030   K 4.3 07/28/2018 0335   CL 105 07/28/2018 0335   CO2 26 07/28/2018 0335   GLUCOSE 129 (H) 07/28/2018 0335   BUN 32 (H) 07/28/2018 0335   BUN 52 (H) 07/24/2018 1030   CREATININE 1.88 (H) 07/28/2018 0335   CREATININE 2.04 (H) 01/12/2016 0853   CALCIUM 8.7 (L) 07/28/2018 0335   PROT 7.1 07/26/2018 1147   PROT 6.4 07/24/2018 1030   ALBUMIN 2.9 (L) 07/26/2018 1147   ALBUMIN 3.2 (L) 07/24/2018 1030   AST 29 07/26/2018 1147   ALT 16 07/26/2018 1147   ALKPHOS 108 07/26/2018 1147   BILITOT 0.5 07/26/2018 1147   BILITOT 0.3 07/24/2018 1030   GFRNONAA 31 (L) 07/28/2018 0335   GFRAA 36 (L) 07/28/2018 0335    GFR Estimated Creatinine Clearance: 35.7 mL/min (A) (by C-G formula based on SCr of 1.88 mg/dL (H)). No results for input(s): LIPASE, AMYLASE in the last 72 hours. No results for input(s): CKTOTAL, CKMB, CKMBINDEX, TROPONINI in the last 72 hours. Invalid input(s): POCBNP No results for  input(s): DDIMER in the last 72 hours. No results for input(s): HGBA1C in the last 72 hours. No results for input(s): CHOL, HDL, LDLCALC, TRIG, CHOLHDL, LDLDIRECT in the last  72 hours. Recent Labs    07/26/18 2131  TSH 1.345   Recent Labs    07/26/18 1147  VITAMINB12 372  FOLATE 42.6  FERRITIN 17*  TIBC 371  IRON 21*  RETICCTPCT 2.4   Coags: No results for input(s): INR in the last 72 hours.  Invalid input(s): PT Microbiology: Recent Results (from the past 240 hour(s))  Fecal occult blood, imunochemical     Status: Abnormal   Collection Time: 07/19/18 12:00 AM  Result Value Ref Range Status   Fecal Occult Bld Positive (A) Negative Final  Urine culture     Status: Abnormal   Collection Time: 07/26/18 12:11 PM  Result Value Ref Range Status   Specimen Description URINE, RANDOM  Final   Special Requests   Final    NONE Performed at [facility], [place] [address], [place], [place] [address]    Culture (A)  Final    80,000 COLONIES/mL SERRATIA MARCESCENS >=100,000 COLONIES/mL PROTEUS MIRABILIS    Report Status 07/28/2018 FINAL  Final   Organism ID, Bacteria SERRATIA MARCESCENS (A)  Final   Organism ID, Bacteria PROTEUS MIRABILIS (A)  Final      Susceptibility   Proteus mirabilis - MIC*    AMPICILLIN <=2 SENSITIVE Sensitive     CEFAZOLIN <=4 SENSITIVE Sensitive     CEFTRIAXONE <=1 SENSITIVE Sensitive     CIPROFLOXACIN <=0.25 SENSITIVE Sensitive     GENTAMICIN <=1 SENSITIVE Sensitive     IMIPENEM 2 SENSITIVE Sensitive     NITROFURANTOIN 128 RESISTANT Resistant     TRIMETH/SULFA <=20 SENSITIVE Sensitive     AMPICILLIN/SULBACTAM <=2 SENSITIVE Sensitive     PIP/TAZO <=4 SENSITIVE Sensitive     * >=100,000 COLONIES/mL PROTEUS MIRABILIS   Serratia marcescens - MIC*    CEFAZOLIN >=64 RESISTANT Resistant     CEFTRIAXONE <=1 SENSITIVE Sensitive     CIPROFLOXACIN <=0.25 SENSITIVE Sensitive     GENTAMICIN <=1 SENSITIVE Sensitive     NITROFURANTOIN 256 RESISTANT  Resistant     TRIMETH/SULFA <=20 SENSITIVE Sensitive     * 80,000 COLONIES/mL SERRATIA MARCESCENS  Blood culture (routine x 2)     Status: None (Preliminary result)   Collection Time: 07/26/18  2:50 PM  Result Value Ref Range Status   Specimen Description BLOOD RIGHT ANTECUBITAL  Final   Special Requests   Final    BOTTLES DRAWN AEROBIC AND ANAEROBIC Blood Culture adequate volume   Culture   Final    NO GROWTH < 24 HOURS Performed at [facility], [address] [place], [place] [address]    Report Status PENDING  Incomplete  Blood culture (routine x 2)     Status: None (Preliminary result)   Collection Time: 07/26/18  3:15 PM  Result Value Ref Range Status   Specimen Description BLOOD RIGHT WRIST  Final   Special Requests   Final    BOTTLES DRAWN AEROBIC AND ANAEROBIC Blood Culture adequate volume   Culture   Final    NO GROWTH < 24 HOURS Performed at [facility], [place] [address] [place], [place] [address]    Report Status PENDING  Incomplete    FURTHER DISCHARGE INSTRUCTIONS:  Get Medicines reviewed and adjusted: Please take all your medications with you for your next visit with your Primary MD  Laboratory/radiological data: Please request your Primary MD to go over all hospital tests and procedure/radiological results at the follow up, please ask your Primary MD to get all [facility]  records sent to his/her office.  In some cases, they will be blood work, cultures and biopsy results pending at the time of your discharge. Please request that your primary care M.D. goes through all the records of your hospital data and follows up on these results.  Also Note the following: If you experience worsening of your admission symptoms, develop shortness of breath, life threatening emergency, suicidal or homicidal thoughts you must seek medical attention immediately by calling 911 or calling your MD immediately  if symptoms less severe.  You must read complete  instructions/literature along with all the possible adverse reactions/side effects for all the Medicines you take and that have been prescribed to you. Take any new Medicines after you have completely understood and accpet all the possible adverse reactions/side effects.   Do not drive when taking Pain medications or sleeping medications (Benzodaizepines)  Do not take more than prescribed Pain, Sleep and Anxiety Medications. It is not advisable to combine anxiety,sleep and pain medications without talking with your primary care practitioner  Special Instructions: If you have smoked or chewed Tobacco  in the last 2 yrs please stop smoking, stop any regular Alcohol  and or any Recreational drug use.  Wear Seat belts while driving.  Please note: You were cared for by a hospitalist during your hospital stay. Once you are discharged, your primary care physician will handle any further medical issues. Please note that NO REFILLS for any discharge medications will be authorized once you are discharged, as it is imperative that you return to your primary care physician (or establish a relationship with a primary care physician if you do not have one) for your post hospital discharge needs so that they can reassess your need for medications and monitor your lab values.  Total Time spent coordinating discharge including counseling, education and face to face time equals 35 minutes.  SignedOren Binet 07/28/2018 9:40 AM

## 2018-07-28 NOTE — Progress Notes (Signed)
Nsg Discharge Note  Admit Date:  07/26/2018 Discharge date: 07/28/2018   [name] to be D/C'd Nursing Home per MD order.  AVS completed.  Copy for chart, and copy for patient signed, and dated. Patient/caregiver able to verbalize understanding.  Discharge Medication: Allergies as of 07/28/2018      Reactions   Brilinta [ticagrelor] Shortness Of Breath, Other (See Comments)   Changed to Plavix due to SOB   Clindamycin/lincomycin Anaphylaxis, Other (See Comments)   Whole body skin peeling, blisters also   Doxycycline Anaphylaxis   Penicillins Rash, Other (See Comments)   Has patient had a PCN reaction causing immediate rash, facial/tongue/throat swelling, SOB or lightheadedness with hypotension: Yes Has patient had a PCN reaction causing severe rash involving mucus membranes or skin necrosis: No Has patient had a PCN reaction that required hospitalization: No Has patient had a PCN reaction occurring within the last 10 years: No If all of the above answers are "NO", then may proceed with Cephalosporin use.   Broccoli [brassica Oleracea Italica]    Negatively affects colostomy   Sulfa Antibiotics Other (See Comments)   "Allergic," per MAR   Tape Rash   Skin is sensitive; please use an alternative to tape      Medication List    STOP taking these medications   omeprazole 20 MG capsule Commonly known as:  PRILOSEC Replaced by:  pantoprazole 40 MG tablet     TAKE these medications   acetaminophen 325 MG tablet Commonly known as:  TYLENOL Take 2 tablets (650 mg total) by mouth every 6 (six) hours as needed for mild pain, moderate pain or headache.   amiodarone 200 MG tablet Commonly known as:  PACERONE Take 2 tablets (400 mg total) by mouth as directed. BEGINNING ON 05/13/18 DECREASE TO 200 MG DAILY What changed:    how much to take  when to take this  additional instructions   apixaban 2.5 MG Tabs tablet Commonly known as:  ELIQUIS Take 1 tablet (2.5 mg total) by  mouth 2 (two) times daily. Start taking on:  07/29/2018   atorvastatin 40 MG tablet Commonly known as:  LIPITOR Take 1 tablet (40 mg total) by mouth daily at 6 PM.   calcium carbonate 600 MG Tabs tablet Commonly known as:  OS-CAL Take 600 mg by mouth daily with breakfast.   docusate sodium 100 MG capsule Commonly known as:  COLACE Take 100 mg by mouth 2 (two) times daily.   feeding supplement (PRO-STAT SUGAR FREE 64) Liqd Take 30 mLs by mouth daily.   furosemide 40 MG tablet Commonly known as:  LASIX Take 1.5 tablets (60 mg total) by mouth 2 (two) times daily.   glimepiride 1 MG tablet Commonly known as:  AMARYL Take 1 mg by mouth 2 (two) times daily with a meal.   MOISTURE BARRIER Oint Apply 1 application topically 3 (three) times daily. To buttocks/sacrum   multivitamin with minerals Tabs tablet Take 1 tablet by mouth daily.   pantoprazole 40 MG tablet Commonly known as:  PROTONIX Take 1 tablet (40 mg total) by mouth 2 (two) times daily. Replaces:  omeprazole 20 MG capsule   ONGLYZA 2.5 MG Tabs tablet Generic drug:  saxagliptin HCl Take 2.5 mg by mouth daily.   saxagliptin HCl 5 MG Tabs tablet Commonly known as:  ONGLYZA Take 0.5 tablets (2.5 mg total) by mouth daily.       Discharge Assessment: Vitals:   07/28/18 0810 07/28/18 0820  BP: 120/61 134/63  Pulse: 61 69  Resp: 13 20  Temp: 97.6 F (36.4 C)   SpO2: 100% 100%   Skin clean, dry and intact without evidence of skin break down, no evidence of skin tears noted. IV catheter discontinued intact. Site without signs and symptoms of complications - no redness or edema noted at insertion site, patient denies c/o pain - only slight tenderness at site.  Dressing with slight pressure applied.  D/c Instructions-Education: Discharge instructions given to patient/family with verbalized understanding. D/c education completed with patient/family including follow up instructions, medication list, d/c activities  limitations if indicated, with other d/c instructions as indicated by MD - patient able to verbalize understanding, all questions fully answered. Patient instructed to return to ED, call 911, or call MD for any changes in condition.  Patient escorted via WC, and D/C to [facility] with sister.  [place] N Danai Gotto, RN 07/28/2018 11:53 AM

## 2018-07-28 NOTE — Progress Notes (Signed)
Report Given to Prescott Gum, RN at [facility]

## 2018-07-28 NOTE — Op Note (Addendum)
[facility] Patient Name: [name] [name] Procedure Date : 07/28/2018 MRN: [id number] Attending MD: Thornton Park MD, MD Date of Birth: 02/11/1933 CSN: [id number] Age: 84 Admit Type: Inpatient Procedure:                Upper GI endoscopy Indications:              Recent gastrointestinal bleeding Providers:                Thornton Park MD, MD, Vista Lawman, RN, Laverda Sorenson, Technician, Rhae Lerner, CRNA Referring MD:              Medicines:                See the Anesthesia note for documentation of the                            administered medications Complications:            No immediate complications. Estimated Blood Loss:     Estimated blood loss: none. Procedure:                Pre-Anesthesia Assessment:                           - Prior to the procedure, a History and Physical                            was performed, and patient medications and                            allergies were reviewed. The patient's tolerance of                            previous anesthesia was also reviewed. The risks                            and benefits of the procedure and the sedation                            options and risks were discussed with the patient.                            All questions were answered, and informed consent                            was obtained. Prior Anticoagulants: The patient has                            taken Eliquis (apixaban), last dose was 2 days                            prior to procedure. ASA Grade Assessment: III - A  patient with severe systemic disease. After                            reviewing the risks and benefits, the patient was                            deemed in satisfactory condition to undergo the                            procedure.                           After obtaining informed consent, the endoscope was                            passed under direct  vision. Throughout the                            procedure, the patient's blood pressure, pulse, and                            oxygen saturations were monitored continuously. The                            [id number]-H190 ([id number]) Olympus adult EGD was introduced                            through the mouth, and advanced to the third part                            of duodenum. The upper GI endoscopy was                            accomplished without difficulty. The patient                            tolerated the procedure well. While the patient was                            still sedated, his ostomy bag was removed and the                            cutaneous site of the ostomy evaluated. Scope In: Scope Out: Findings:      The esophagus was normal.      One non-bleeding cratered gastric ulcer with no stigmata of bleeding was       found in the gastric antrum. The lesion was 4 mm in largest dimension.       Biopsies were taken with a cold forceps from the antrum, body, and       fundus for Helicobacter pylori testing. No blood present. The       surrounding mucosa is extremely friable.      Diffuse mildly erythematous mucosa without active bleeding and with no       stigmata of bleeding was found in the duodenal bulb.  No blood present.      The exam was otherwise without abnormality.      The ostomy bag was removed and the skin carefully evaluated. There were       three areas of recent bleeding noted around the cutaneous portion of his       ostomy. No blood was seen. These were successfully treated with silver       nitrate sticks. Impression:               - Normal esophagus.                           - Non-bleeding gastric ulcer with no stigmata of                            bleeding. Biopsied. This is the likely source of                            his anemia.                           - Mild erythematous duodenopathy.                           - The examination was otherwise  normal.                           - Bleeding vessel around the ostomy site was                            identified and successfully treated. Recommendation:           - Await pathology results.                           - Resume regular diet.                           - May resume Eliquis tomorrow if clinically                            appropriate. (The patient tells me his cardiologist                            was planning to discontinue it all together soon)                           - PPI BID x 2 months.                           - Avoid all NSAIDs.                           - Wound care/Ostomy team consultation as an                            outpatient if he has additional ostomy site  bleeding.                           - Repeat upper endoscopy in 2 months to check                            healing.                           - No additional inpatient GI evaluation planned. He                            may go home from a GI perseptive with outpatient                            follow-up with Dr. Henrene Pastor. Procedure Code(s):        --- Professional ---                           [phone], Esophagogastroduodenoscopy, flexible,                            transoral; with biopsy, single or multiple Diagnosis Code(s):        --- Professional ---                           K25.9, Gastric ulcer, unspecified as acute or                            chronic, without hemorrhage or perforation                           K31.89, Other diseases of stomach and duodenum                           K92.2, Gastrointestinal hemorrhage, unspecified CPT copyright 2018 American Medical Association. All rights reserved. The codes documented in this report are preliminary and upon coder review may  be revised to meet current compliance requirements. Thornton Park MD, MD 07/28/2018 8:19:52 AM This report has been signed electronically. Number of Addenda: 0

## 2018-07-28 NOTE — Anesthesia Postprocedure Evaluation (Signed)
Anesthesia Post Note  Patient: [name] [name]  Procedure(s) Performed: ESOPHAGOGASTRODUODENOSCOPY (EGD) WITH PROPOFOL (N/A ) BIOPSY     Patient location during evaluation: PACU Anesthesia Type: MAC Level of consciousness: awake and alert Pain management: pain level controlled Vital Signs Assessment: post-procedure vital signs reviewed and stable Respiratory status: spontaneous breathing, nonlabored ventilation and respiratory function stable Cardiovascular status: stable and blood pressure returned to baseline Postop Assessment: no apparent nausea or vomiting Anesthetic complications: no    Last Vitals:  Vitals:   07/28/18 0810 07/28/18 0820  BP: 120/61 134/63  Pulse: 61 69  Resp: 13 20  Temp: 36.4 C   SpO2: 100% 100%    Last Pain:  Vitals:   07/28/18 0820  TempSrc:   PainSc: 0-No pain                 Verlie Hellenbrand,W. EDMOND

## 2018-07-28 NOTE — Anesthesia Preprocedure Evaluation (Addendum)
Anesthesia Evaluation  Patient identified by MRN, date of birth, ID band Patient awake    Reviewed: Allergy & Precautions, H&P , NPO status , Patient's Chart, lab work & pertinent test results  Airway Mallampati: III  TM Distance: >3 FB Neck ROM: Full    Dental no notable dental hx. (+) Teeth Intact, Dental Advisory Given   Pulmonary neg pulmonary ROS,    Pulmonary exam normal breath sounds clear to auscultation       Cardiovascular hypertension, + CAD, + Cardiac Stents and +CHF  + dysrhythmias Atrial Fibrillation  Rhythm:Regular Rate:Normal     Neuro/Psych negative neurological ROS  negative psych ROS   GI/Hepatic Neg liver ROS, PUD, GERD  Medicated,  Endo/Other  diabetes, Type 2, Oral Hypoglycemic Agents  Renal/GU Renal InsufficiencyRenal disease  negative genitourinary   Musculoskeletal  (+) Arthritis , Osteoarthritis,    Abdominal   Peds  Hematology  (+) Blood dyscrasia, anemia ,   Anesthesia Other Findings   Reproductive/Obstetrics negative OB ROS                            Anesthesia Physical Anesthesia Plan  ASA: III  Anesthesia Plan: MAC   Post-op Pain Management:    Induction: Intravenous  PONV Risk Score and Plan: 1 and Propofol infusion  Airway Management Planned: Nasal Cannula  Additional Equipment:   Intra-op Plan:   Post-operative Plan:   Informed Consent: I have reviewed the patients History and Physical, chart, labs and discussed the procedure including the risks, benefits and alternatives for the proposed anesthesia with the patient or authorized representative who has indicated his/her understanding and acceptance.   Dental advisory given  Plan Discussed with: CRNA  Anesthesia Plan Comments:         Anesthesia Quick Evaluation

## 2018-07-28 NOTE — NC FL2 (Signed)
[place] LEVEL OF CARE SCREENING TOOL     IDENTIFICATION  Patient Name: [name] [name] Birthdate: 29-Jun-1933 Sex: male Admission Date (Current Location): 07/26/2018  [facility] and [place] Number:  Herbalist and Address:  [facility] [place], [place] [address] [place], [place] [address]      Provider Number: [id number]  Attending Physician Name and Address:  Jonetta Osgood, MD  Relative Name and Phone Number:  [name], sister, [phone]    Current Level of Care: Hospital Recommended Level of Care: [place] Prior Approval Number:    Date Approved/Denied:   PASRR Number:    Discharge Plan: Other (Comment)(ALF)    Current Diagnoses: Patient Active Problem List   Diagnosis Date Noted  . Acute gastric ulcer with hemorrhage   . Symptomatic anemia 07/26/2018  . Chronic combined systolic and diastolic CHF (congestive heart failure) ([place]) 04/10/2018  . Pelvic fracture ([place]) 03/28/2018  . Severe protein-calorie malnutrition ([place])   . Diabetic polyneuropathy associated with type 2 diabetes mellitus ([place])   . DCM (dilated cardiomyopathy) ([place]) 02/27/2018  . Pressure injury of skin 02/20/2018  . Hyperkalemia 02/19/2018  . Elevated troponin 02/19/2018  . GERD (gastroesophageal reflux disease) 02/19/2018  . Type II diabetes mellitus with renal manifestations ([place]) 02/19/2018  . HTN (hypertension) 02/19/2018  . AKI (acute kidney injury) ([place])   . Anaphylactic syndrome   . Drug-induced skin rash   . Acute metabolic encephalopathy 93/81/0175  . Allergic reaction caused by a drug 12/05/2017  . Acute renal failure superimposed on stage 3 chronic kidney disease ([place]) 12/05/2017  . Metabolic acidosis 08/10/8526  . Hardware complicating wound infection ([place]) 11/30/2017  . Trimalleolar fracture of ankle, closed, left, sequela   . Fall 10/24/2017  . Malleolar fracture, left, closed, initial encounter 10/24/2017  . Fibula fracture  10/24/2017  . Diabetes mellitus with diabetic nephropathy without long-term current use of insulin ([place]) 01/05/2017  . CKD (chronic kidney disease), stage III ([place])   . Persistent atrial fibrillation   . Bleeding gastrointestinal   . Dyslipidemia 10/14/2014  . CAD (coronary artery disease) 07/11/2014  . Ulcerative colitis ([place])     Orientation RESPIRATION BLADDER Height & Weight     Self, Time, Situation, Place  Normal Continent Weight: 226 lb (102.5 kg) Height:  5' 10"  (177.8 cm)  BEHAVIORAL SYMPTOMS/MOOD NEUROLOGICAL BOWEL NUTRITION STATUS      Continent (see DC summary)  AMBULATORY STATUS COMMUNICATION OF NEEDS Skin   Limited Assist Verbally Normal                       Personal Care Assistance Level of Assistance  Bathing, Feeding, Dressing Bathing Assistance: Limited assistance Feeding assistance: Independent Dressing Assistance: Limited assistance     Functional Limitations Info  Sight, Hearing, Speech Sight Info: Adequate Hearing Info: Adequate Speech Info: Adequate    SPECIAL CARE FACTORS FREQUENCY  PT (By licensed PT), OT (By licensed OT)     PT Frequency: home health PT 5x/week OT Frequency: home health 3x/week            Contractures Contractures Info: Not present    Additional Factors Info  Code Status, Allergies, Insulin Sliding Scale Code Status Info: Full Allergies Info: Allergies:  Brilinta Ticagrelor, Clindamycin/lincomycin, Doxycycline, Penicillins, Broccoli Brassica Oleracea Italica, Sulfa Antibiotics, Tape   Insulin Sliding Scale Info: Novolog       Current Medications (07/28/2018):  This is the current hospital active medication list  Current Facility-Administered Medications  Medication Dose Route Frequency Provider Last Rate Last Dose  . 0.9 %  sodium chloride infusion (Manually program via Guardrails IV Fluids)   Intravenous Once Thornton Park, MD      . amiodarone (PACERONE) tablet 200 mg  200 mg Oral Daily Thornton Park, MD   200 mg at 07/28/18 0949  . atorvastatin (LIPITOR) tablet 40 mg  40 mg Oral q1800 Thornton Park, MD   40 mg at 07/27/18 1710  . calcium carbonate (OS-CAL - dosed in mg of elemental calcium) tablet 1,250 mg  1,250 mg Oral Q breakfast Thornton Park, MD   1,250 mg at 07/28/18 0950  . docusate sodium (COLACE) capsule 100 mg  100 mg Oral BID Thornton Park, MD   100 mg at 07/28/18 0951  . feeding supplement (PRO-STAT SUGAR FREE 64) liquid 30 mL  30 mL Oral Daily Thornton Park, MD   30 mL at 07/28/18 0954  . furosemide (LASIX) tablet 60 mg  60 mg Oral BID Thornton Park, MD   60 mg at 07/28/18 0950  . insulin aspart (novoLOG) injection 0-5 Units  0-5 Units Subcutaneous QHS Thornton Park, MD      . insulin aspart (novoLOG) injection 0-9 Units  0-9 Units Subcutaneous TID WC Thornton Park, MD   3 Units at 07/27/18 1725  . multivitamin with minerals tablet 1 tablet  1 tablet Oral Daily Thornton Park, MD   1 tablet at 07/28/18 0950  . pantoprazole (PROTONIX) EC tablet 40 mg  40 mg Oral BID Thornton Park, MD   40 mg at 07/28/18 0949  . Zinc Oxide (TRIPLE PASTE) 12.8 % ointment 1 application  1 application Topical TID Thornton Park, MD   1 application at 36/46/80 0954     Discharge Medications: TAKE these medications   acetaminophen 325 MG tablet Commonly known as:  TYLENOL Take 2 tablets (650 mg total) by mouth every 6 (six) hours as needed for mild pain, moderate pain or headache.   amiodarone 200 MG tablet Commonly known as:  PACERONE Take 2 tablets (400 mg total) by mouth as directed. BEGINNING ON 05/13/18 DECREASE TO 200 MG DAILY What changed:    how much to take  when to take this  additional instructions   apixaban 2.5 MG Tabs tablet Commonly known as:  ELIQUIS Take 1 tablet (2.5 mg total) by mouth 2 (two) times daily. Start taking on:  07/29/2018   atorvastatin 40 MG tablet Commonly known as:  LIPITOR Take 1 tablet (40 mg total)  by mouth daily at 6 PM.   calcium carbonate 600 MG Tabs tablet Commonly known as:  OS-CAL Take 600 mg by mouth daily with breakfast.   docusate sodium 100 MG capsule Commonly known as:  COLACE Take 100 mg by mouth 2 (two) times daily.   feeding supplement (PRO-STAT SUGAR FREE 64) Liqd Take 30 mLs by mouth daily.   furosemide 40 MG tablet Commonly known as:  LASIX Take 1.5 tablets (60 mg total) by mouth 2 (two) times daily.   glimepiride 1 MG tablet Commonly known as:  AMARYL Take 1 mg by mouth 2 (two) times daily with a meal.   MOISTURE BARRIER Oint Apply 1 application topically 3 (three) times daily. To buttocks/sacrum   multivitamin with minerals Tabs tablet Take 1 tablet by mouth daily.   pantoprazole 40 MG tablet Commonly known as:  PROTONIX Take 1 tablet (40 mg total) by mouth 2 (two) times daily. Replaces:  omeprazole 20 MG  capsule   ONGLYZA 2.5 MG Tabs tablet Generic drug:  saxagliptin HCl Take 2.5 mg by mouth daily.   saxagliptin HCl 5 MG Tabs tablet Commonly known as:  ONGLYZA Take 0.5 tablets (2.5 mg total) by mouth daily.     Relevant Imaging Results:  Relevant Lab Results:   Additional Information SS#: [place]  [place], [place]

## 2018-07-28 NOTE — Transfer of Care (Signed)
Immediate Anesthesia Transfer of Care Note  Patient: [name] [name]  Procedure(s) Performed: ESOPHAGOGASTRODUODENOSCOPY (EGD) WITH PROPOFOL (N/A ) BIOPSY  Patient Location: Endoscopy Unit  Anesthesia Type:MAC  Level of Consciousness: awake, alert  and oriented  Airway & Oxygen Therapy: Patient Spontanous Breathing and Patient connected to nasal cannula oxygen  Post-op Assessment: Report given to RN, Post -op Vital signs reviewed and stable and Patient moving all extremities  Post vital signs: Reviewed and stable  Last Vitals:  Vitals Value Taken Time  BP    Temp    Pulse 64 07/28/2018  8:10 AM  Resp 13 07/28/2018  8:10 AM  SpO2 96 % 07/28/2018  8:10 AM  Vitals shown include unvalidated device data.  Last Pain:  Vitals:   07/28/18 0715  TempSrc: Oral  PainSc: 0-No pain         Complications: No apparent anesthesia complications

## 2018-07-31 LAB — CULTURE, BLOOD (ROUTINE X 2)
Culture: NO GROWTH
Culture: NO GROWTH
Special Requests: ADEQUATE
Special Requests: ADEQUATE

## 2018-08-01 ENCOUNTER — Other Ambulatory Visit: Payer: Self-pay

## 2018-08-01 DIAGNOSIS — M79643 Pain in unspecified hand: Secondary | ICD-10-CM | POA: Diagnosis not present

## 2018-08-01 DIAGNOSIS — I509 Heart failure, unspecified: Secondary | ICD-10-CM | POA: Diagnosis not present

## 2018-08-01 DIAGNOSIS — D649 Anemia, unspecified: Secondary | ICD-10-CM

## 2018-08-01 DIAGNOSIS — D519 Vitamin B12 deficiency anemia, unspecified: Secondary | ICD-10-CM

## 2018-08-01 DIAGNOSIS — N184 Chronic kidney disease, stage 4 (severe): Secondary | ICD-10-CM | POA: Diagnosis not present

## 2018-08-01 DIAGNOSIS — K2971 Gastritis, unspecified, with bleeding: Secondary | ICD-10-CM

## 2018-08-01 DIAGNOSIS — K5190 Ulcerative colitis, unspecified, without complications: Secondary | ICD-10-CM | POA: Diagnosis not present

## 2018-08-01 DIAGNOSIS — K922 Gastrointestinal hemorrhage, unspecified: Secondary | ICD-10-CM | POA: Diagnosis not present

## 2018-08-01 DIAGNOSIS — M7989 Other specified soft tissue disorders: Secondary | ICD-10-CM | POA: Diagnosis not present

## 2018-08-01 DIAGNOSIS — E0821 Diabetes mellitus due to underlying condition with diabetic nephropathy: Secondary | ICD-10-CM

## 2018-08-01 DIAGNOSIS — R609 Edema, unspecified: Secondary | ICD-10-CM | POA: Diagnosis not present

## 2018-08-02 ENCOUNTER — Telehealth: Payer: Self-pay | Admitting: Gastroenterology

## 2018-08-02 DIAGNOSIS — Z8711 Personal history of peptic ulcer disease: Secondary | ICD-10-CM | POA: Diagnosis not present

## 2018-08-02 DIAGNOSIS — I4819 Other persistent atrial fibrillation: Secondary | ICD-10-CM | POA: Diagnosis not present

## 2018-08-02 DIAGNOSIS — I2510 Atherosclerotic heart disease of native coronary artery without angina pectoris: Secondary | ICD-10-CM | POA: Diagnosis not present

## 2018-08-02 DIAGNOSIS — I272 Pulmonary hypertension, unspecified: Secondary | ICD-10-CM | POA: Diagnosis not present

## 2018-08-02 DIAGNOSIS — Z8781 Personal history of (healed) traumatic fracture: Secondary | ICD-10-CM | POA: Diagnosis not present

## 2018-08-02 DIAGNOSIS — E1122 Type 2 diabetes mellitus with diabetic chronic kidney disease: Secondary | ICD-10-CM | POA: Diagnosis not present

## 2018-08-02 DIAGNOSIS — N183 Chronic kidney disease, stage 3 (moderate): Secondary | ICD-10-CM | POA: Diagnosis not present

## 2018-08-02 DIAGNOSIS — E43 Unspecified severe protein-calorie malnutrition: Secondary | ICD-10-CM | POA: Diagnosis not present

## 2018-08-02 DIAGNOSIS — Z9049 Acquired absence of other specified parts of digestive tract: Secondary | ICD-10-CM | POA: Diagnosis not present

## 2018-08-02 DIAGNOSIS — Z933 Colostomy status: Secondary | ICD-10-CM | POA: Diagnosis not present

## 2018-08-02 DIAGNOSIS — I5042 Chronic combined systolic (congestive) and diastolic (congestive) heart failure: Secondary | ICD-10-CM | POA: Diagnosis not present

## 2018-08-02 DIAGNOSIS — I130 Hypertensive heart and chronic kidney disease with heart failure and stage 1 through stage 4 chronic kidney disease, or unspecified chronic kidney disease: Secondary | ICD-10-CM | POA: Diagnosis not present

## 2018-08-02 DIAGNOSIS — Z7901 Long term (current) use of anticoagulants: Secondary | ICD-10-CM | POA: Diagnosis not present

## 2018-08-02 DIAGNOSIS — D631 Anemia in chronic kidney disease: Secondary | ICD-10-CM | POA: Diagnosis not present

## 2018-08-02 NOTE — Telephone Encounter (Signed)
Spoke to patient, relayed test results and Dr. Payton Emerald recommendations. He understands to come to lab for further testing and expect a letter in the mail. He will need this information to arrange a ride to our lab.

## 2018-08-02 NOTE — Telephone Encounter (Signed)
Patient returning nurse's call regarding lab order

## 2018-08-07 DIAGNOSIS — N183 Chronic kidney disease, stage 3 (moderate): Secondary | ICD-10-CM | POA: Diagnosis not present

## 2018-08-07 DIAGNOSIS — I5042 Chronic combined systolic (congestive) and diastolic (congestive) heart failure: Secondary | ICD-10-CM | POA: Diagnosis not present

## 2018-08-07 DIAGNOSIS — I2510 Atherosclerotic heart disease of native coronary artery without angina pectoris: Secondary | ICD-10-CM | POA: Diagnosis not present

## 2018-08-07 DIAGNOSIS — E1122 Type 2 diabetes mellitus with diabetic chronic kidney disease: Secondary | ICD-10-CM | POA: Diagnosis not present

## 2018-08-07 DIAGNOSIS — I130 Hypertensive heart and chronic kidney disease with heart failure and stage 1 through stage 4 chronic kidney disease, or unspecified chronic kidney disease: Secondary | ICD-10-CM | POA: Diagnosis not present

## 2018-08-07 DIAGNOSIS — D631 Anemia in chronic kidney disease: Secondary | ICD-10-CM | POA: Diagnosis not present

## 2018-08-08 ENCOUNTER — Ambulatory Visit (INDEPENDENT_AMBULATORY_CARE_PROVIDER_SITE_OTHER): Payer: Medicare Other | Admitting: Physician Assistant

## 2018-08-08 ENCOUNTER — Encounter: Payer: Self-pay | Admitting: Physician Assistant

## 2018-08-08 VITALS — BP 108/50 | HR 83 | Ht 70.0 in | Wt 226.8 lb

## 2018-08-08 DIAGNOSIS — I10 Essential (primary) hypertension: Secondary | ICD-10-CM

## 2018-08-08 DIAGNOSIS — I5042 Chronic combined systolic (congestive) and diastolic (congestive) heart failure: Secondary | ICD-10-CM | POA: Diagnosis not present

## 2018-08-08 DIAGNOSIS — N183 Chronic kidney disease, stage 3 unspecified: Secondary | ICD-10-CM

## 2018-08-08 DIAGNOSIS — I2510 Atherosclerotic heart disease of native coronary artery without angina pectoris: Secondary | ICD-10-CM | POA: Diagnosis not present

## 2018-08-08 DIAGNOSIS — I4819 Other persistent atrial fibrillation: Secondary | ICD-10-CM

## 2018-08-08 DIAGNOSIS — K2971 Gastritis, unspecified, with bleeding: Secondary | ICD-10-CM | POA: Diagnosis not present

## 2018-08-08 NOTE — Progress Notes (Signed)
Cardiology Office Note    Date:  08/08/2018   ID:  [name] [name], DOB [birth year], MRN [id number]  PCP:  [name], MD  Cardiologist: [name], MD EPS: None  Chief Complaint  Patient presents with  . Hospitalization Follow-up    History of Present Illness:  [name] [name] is a 84 y.o. male with history of CAD status post DES to the RI and known chronic occlusion of the LAD, PAF, atrial flutter status post ablation diastolic CHF, CKD, hypertension, DM.  Patient has previously not been anticoagulated due to a history of GI bleed.  02/2018 he was bradycardic with profound hyperkalemia.  Heart rate recovered with reversal of hyperkalemia but then he went into atrial fib with RVR while amiodarone and beta-blocker on hold.  He was placed on Eliquis and underwent TEE guided DCCV.  EF 35 to 40%.  Follow-up echo 02/2018 EF 40 to 45%.  CHA2DS2-VASc equals 6. last saw [name] 07/16/2018 and was in normal sinus rhythm.  Continued to have lower extremity edema.  No ACE inhibitor secondary to hyperkalemia.  He will not wear compression stockings.  CBC was checked and hemoglobin came back at 7.7.  It was found that he was on Eliquis 5 mg twice daily rather than 2.5 mg 2 times daily.  Last hemoglobin 8.8 and potassium 4.3 creatinine 1.88 07/28/2018.  Endoscopy showed nonbleeding gastric ulcer likely source of bleed.  See below for details.  Patient comes in today accompanied by his wifeUpset that he was getting the wrong Eliquis dose.  They are very upset with the facility he is staying at.  Had blood work by Dr. Delfina Redwood last week and his hemoglobin was up to 9.0.  He did have swelling and he was given extra Lasix for 3 days.  Creatinine last week was 2.0.  Weight is down today and breathing is stable.  He does have chronic dyspnea on exertion while doing rehab.  No further noticeable bleeding.    Past Medical History:  Diagnosis Date  . Allergic reaction caused by a drug 11/2017  . Anemia     . Atrial flutter ([place])    a. s/p RFCA of counterclockwise cavo-tricuspid isthmus dependent atrial flutter 2009 by Dr. Rayann Heman.  Marland Kitchen CAD (coronary artery disease) 06/2014   a. 06/2014: abnl nuc. Cath: Totally occluded LAD with faint collaterals (not a candidate for CTO PCI), 90% ramus s/p DES, 50-70% OM2.  . Chronic diastolic CHF (congestive heart failure) ([place])    a. 2D Echo 10/23/13: EF 50-55%, basal mid-inf HK, grade 2 DD, mild MR, mod dilated LA, no sig change from prior.  . CKD (chronic kidney disease), stage III ([place])   . Colostomy in place [facility])   . Complication of anesthesia    " during my kidney stone surgery my heart went out of rhythm  . Compression fracture of L1 lumbar vertebra ([name])   . DCM (dilated cardiomyopathy) ([place]) 02/27/2018   EF 35-40% by TEE 02/2018 felt tachycardia mediated  . Diabetes ([place])    TYPE 2   . Dyslipidemia   . Dyspnea   . Frequent PVCs   . GERD (gastroesophageal reflux disease)   . History of blood transfusion   . History of kidney stones   . History of peptic ulcer disease   . HTN (hypertension)   . Hx of acute renal failure 01/02/10-3/24-11   due to hypovolemic shock, gastroenteritis and dehydration,hospitalized . Did requre a few days of dialysis. Cr at discharge  was 1.8  . Kidney disease   . Kidney stone may 2009   Right hydronephrosis, S/P stone removal  . Lower back pain   . Macular degeneration   . OA (osteoarthritis) of hip   . Obesity   . Osteopenia   . PAF (paroxysmal atrial fibrillation) ([name])    not on long term anticoagulation due to history of heme positive stools and anemia  . Shingles    episode  . Small bowel obstruction, partial ([place]) 2009   Episode  . Ulcerative colitis ([place])    a. s/p total colectomy remotely.    Past Surgical History:  Procedure Laterality Date  . BACK SURGERY    . BIOPSY  07/28/2018   Procedure: BIOPSY;  Surgeon: Thornton Park, MD;  Location: [place];  Service: Gastroenterology;;  . CARDIAC  CATHETERIZATION  06/19/2014  . CARDIOVERSION N/A 02/23/2018   Procedure: CARDIOVERSION;  Surgeon: Dorothy Spark, MD;  Location[facility] ENDOSCOPY;  Service: Cardiovascular;  Laterality: N/A;  . COLON RESECTION    . COLOSTOMY    . CORONARY ANGIOPLASTY    . CORONARY STENT PLACEMENT  06/19/2014   DES       dr Martinique  . ESOPHAGOGASTRODUODENOSCOPY (EGD) WITH PROPOFOL N/A 07/28/2018   Procedure: ESOPHAGOGASTRODUODENOSCOPY (EGD) WITH PROPOFOL;  Surgeon: Thornton Park, MD;  Location: [place];  Service: Gastroenterology;  Laterality: N/A;  . HARDWARE REMOVAL Left 12/01/2017   Procedure: HARDWARE REMOVAL LEFT ANKLE;  Surgeon: Newt Minion, MD;  Location: [place];  Service: Orthopedics;  Laterality: Left;  . HERNIA REPAIR    . LEFT AND RIGHT HEART CATHETERIZATION WITH CORONARY ANGIOGRAM N/A 06/19/2014   Procedure: LEFT AND RIGHT HEART CATHETERIZATION WITH CORONARY ANGIOGRAM;  Surgeon: Peter M Martinique, MD;  Location: [facility] CATH LAB;  Service: Cardiovascular;  Laterality: N/A;  . ORIF ANKLE FRACTURE Left 10/25/2017   Procedure: OPEN REDUCTION INTERNAL FIXATION (ORIF) LEFT ANKLE FRACTURE;  Surgeon: Newt Minion, MD;  Location: [place];  Service: Orthopedics;  Laterality: Left;  . TEE WITHOUT CARDIOVERSION N/A 02/23/2018   Procedure: TRANSESOPHAGEAL ECHOCARDIOGRAM (TEE);  Surgeon: Dorothy Spark, MD;  Location[facility] END[facility];  Service: Cardiovascular;  Laterality: N/A;  . TONSILLECTOMY      Current Medications: Current Meds  Medication Sig  . acetaminophen (TYLENOL) 325 MG tablet Take 2 tablets (650 mg total) by mouth every 6 (six) hours as needed for mild pain, moderate pain or headache.  . Amino Acids-Protein Hydrolys (FEEDING SUPPLEMENT, PRO-STAT SUGAR FREE 64,) LIQD Take 30 mLs by mouth daily.  Marland Kitchen amiodarone (PACERONE) 200 MG tablet Take 2 tablets (400 mg total) by mouth as directed. BEGINNING ON 05/13/18 DECREASE TO 200 MG DAILY (Patient taking differently: Take 200 mg by mouth daily. )  . apixaban  (ELIQUIS) 2.5 MG TABS tablet Take 1 tablet (2.5 mg total) by mouth 2 (two) times daily.  Marland Kitchen atorvastatin (LIPITOR) 40 MG tablet Take 1 tablet (40 mg total) by mouth daily at 6 PM.  . calcium carbonate (OS-CAL) 600 MG TABS tablet Take 600 mg by mouth daily with breakfast.  . Dermatological Products, Misc. (MOISTURE BARRIER) OINT Apply 1 application topically 3 (three) times daily. To buttocks/sacrum  . docusate sodium (COLACE) 100 MG capsule Take 100 mg by mouth 2 (two) times daily.  . furosemide (LASIX) 40 MG tablet Take 1.5 tablets (60 mg total) by mouth 2 (two) times daily.  Marland Kitchen glimepiride (AMARYL) 1 MG tablet Take 1 mg by mouth 2 (two) times daily with a meal.   .  Multiple Vitamin (MULTIVITAMIN WITH MINERALS) TABS tablet Take 1 tablet by mouth daily.  . pantoprazole (PROTONIX) 40 MG tablet Take 1 tablet (40 mg total) by mouth 2 (two) times daily.  . saxagliptin HCl (ONGLYZA) 2.5 MG TABS tablet Take 2.5 mg by mouth daily.     Allergies:   Brilinta [ticagrelor]; Clindamycin/lincomycin; Doxycycline; Penicillins; Broccoli [brassica oleracea italica]; Sulfa antibiotics; and Tape   Social History   Socioeconomic History  . Marital status: Widowed    Spouse name: Not on file  . Number of children: Not on file  . Years of education: Not on file  . Highest education level: Not on file  Occupational History  . Not on file  Social Needs  . Financial resource strain: Not on file  . Food insecurity:    Worry: Not on file    Inability: Not on file  . Transportation needs:    Medical: Not on file    Non-medical: Not on file  Tobacco Use  . Smoking status: Never Smoker  . Smokeless tobacco: Never Used  Substance and Sexual Activity  . Alcohol use: No  . Drug use: No  . Sexual activity: Not on file  Lifestyle  . Physical activity:    Days per week: Not on file    Minutes per session: Not on file  . Stress: Not on file  Relationships  . Social connections:    Talks on phone: Not on file      Gets together: Not on file    Attends religious service: Not on file    Active member of club or organization: Not on file    Attends meetings of clubs or organizations: Not on file    Relationship status: Not on file  Other Topics Concern  . Not on file  Social History Narrative  . Not on file     Family History:  The patient's family history includes Colon cancer in his mother.   ROS:   Please see the history of present illness.    ROS All other systems reviewed and are negative.   PHYSICAL EXAM:   VS:  BP (!) 108/50 (BP Location: Left Arm, Patient Position: Sitting, Cuff Size: Large)   Pulse 83   Ht 5' 10"  (1.778 m)   Wt 226 lb 12.8 oz (102.9 kg)   SpO2 100% Comment: at rest  BMI 32.54 kg/m   Physical Exam  GEN: Obese, in no acute distress   Neck: no JVD, carotid bruits, or masses Cardiac:RRR; 1/6 systolic murmur in the left sternal border Respiratory: Decreased breath sounds but clear to auscultation bilaterally, normal work of breathing GI: soft, nontender, nondistended, + BS Ext: Trace of edema bilaterally without cyanosis, clubbing, Good distal pulses bilaterally Neuro:  Alert and Oriented x 3 Psych: euthymic mood, full affect  Wt Readings from Last 3 Encounters:  08/08/18 226 lb 12.8 oz (102.9 kg)  07/28/18 226 lb (102.5 kg)  07/16/18 231 lb 6.4 oz (105 kg)      Studies/Labs Reviewed:   EKG:  EKG is not ordered today.  Recent Labs: 07/19/2018: NT-Pro BNP 1,351 07/26/2018: ALT 16; B Natriuretic Peptide 188.6; Magnesium 1.8; TSH 1.345 07/28/2018: BUN 32; Creatinine, Ser 1.88; Hemoglobin 8.8; Platelets 295; Potassium 4.3; Sodium 138   Lipid Panel    Component Value Date/Time   CHOL 112 03/27/2018 0547   TRIG 119 03/27/2018 0547   HDL 36 (L) 03/27/2018 0547   CHOLHDL 3.1 03/27/2018 0547   VLDL 24 03/27/2018 0547  [place] 52 03/27/2018 0547    Additional studies/ records that were reviewed today include:  Endoscopy 07/28/2018 Normal esophagus. -  Non-bleeding gastric ulcer with no stigmata of bleeding. Biopsied. This is the likely source of his anemia. - Mild erythematous duodenopathy. - The examination was otherwise normal. - Bleeding vessel around the ostomy site was identified and successfully treated. Impression: Await pathology results. - Resume regular diet. - May resume Eliquis tomorrow if clinically appropriate. (The patient tells me his cardiologist was planning to discontinue it all together soon) - PPI BID x 2 months. - Avoid all NSAIDs. - Wound care/Ostomy team consultation as an outpatient if he has additional ostomy site bleeding. - Repeat upper endoscopy in 2 months to check healing. - No additional inpatient GI evaluation planned. He may go home from a GI perseptive with outpatient follow-up with Dr. Henrene Pastor.  ASSESSMENT:    1. Persistent atrial fibrillation   2. Coronary artery disease involving native coronary artery of native heart without angina pectoris   3. Chronic combined systolic and diastolic CHF (congestive heart failure) ([place])   4. Essential hypertension   5. Gastrointestinal hemorrhage associated with gastritis, unspecified gastritis type   6. CKD (chronic kidney disease), stage III ([name])      PLAN:  In order of problems listed above:   Persistent atrial fibrillation status post TEE guided DCCV 02/2018.  Chads vas score equals 6.  Because of age and creatinine he is on lower dose Eliquis 2.5 mg twice daily.  Recent GI bleed while on higher dose that was not lowered by the facility he is staying in.  Hemoglobin up to 9.6 last week.  Continue amiodarone 200 mg once a day  CAD status post DES to the RI with known occlusion of the LAD.  Stable without angina.  Chronic combined systolic and diastolic CHF most recent EF 40 to 45% 02/2018.  Patient did have extra fluid on board last week and was given increase Lasix for 3 days by PCP.  Weight is down today.  No heart failure on exam.  Continue current  dose Lasix.  Creatinine checked last week was 2.0 .  Follow-up with Dr. Radford Pax in 2 months.  essential hypertension blood pressure well controlled  GI bleed secondary to nonbleeding gastric ulcer on endoscopy.  They cleared him to be back on Eliquis.  CKD stage III creatinine 2.0 last week   Medication Adjustments/Labs and Tests Ordered: Current medicines are reviewed at length with the patient today.  Concerns regarding medicines are outlined above.  Medication changes, Labs and Tests ordered today are listed in the Patient Instructions below. Patient Instructions   Medication Instructions:  Your physician recommends that you continue on your current medications as directed. Please refer to the Current Medication list given to you today.  If you need a refill on your cardiac medications before your next appointment, please call your pharmacy.   Lab work: None ordered If you have labs (blood work) drawn today and your tests are completely normal, you will receive your results only by: Marland Kitchen MyChart Message (if you have MyChart) OR . A paper copy in the mail If you have any lab test that is abnormal or we need to change your treatment, we will call you to review the results.  Testing/Procedures: None ordered  Follow-Up: Follow up with Dr. Radford Pax on 11/08/18 at 11:40 AM.   Any Other Special Instructions Will Be Listed Below (If Applicable).  1. Weigh yourself daily 2. Wear compression stockings  3. 2 GRAM Sodium Diet  Two Gram Sodium Diet 2000 mg  What is Sodium? Sodium is a mineral found naturally in many foods. The most significant source of sodium in the diet is table salt, which is about 40% sodium.  Processed, convenience, and preserved foods also contain a large amount of sodium.  The body needs only 500 mg of sodium daily to function,  A normal diet provides more than enough sodium even if you do not use salt.  Why Limit Sodium? A build up of sodium in the body can cause  thirst, increased blood pressure, shortness of breath, and water retention.  Decreasing sodium in the diet can reduce edema and risk of heart attack or stroke associated with high blood pressure.  Keep in mind that there are many other factors involved in these health problems.  Heredity, obesity, lack of exercise, cigarette smoking, stress and what you eat all play a role.  General Guidelines:  Do not add salt at the table or in cooking.  One teaspoon of salt contains over 2 grams of sodium.  Read food labels  Avoid processed and convenience foods  Ask your dietitian before eating any foods not dicussed in the menu planning guidelines  Consult your physician if you wish to use a salt substitute or a sodium containing medication such as antacids.  Limit milk and milk products to 16 oz (2 cups) per day.  Shopping Hints:  READ LABELS!! "Dietetic" does not necessarily mean low sodium.  Salt and other sodium ingredients are often added to foods during processing.   Menu Planning Guidelines Food Group Choose More Often Avoid  Beverages (see also the milk group All fruit juices, low-sodium, salt-free vegetables juices, low-sodium carbonated beverages Regular vegetable or tomato juices, commercially softened water used for drinking or cooking  Breads and Cereals Enriched white, wheat, rye and pumpernickel bread, hard rolls and dinner rolls; muffins, cornbread and waffles; most dry cereals, cooked cereal without added salt; unsalted crackers and breadsticks; low sodium or homemade bread crumbs Bread, rolls and crackers with salted tops; quick breads; instant hot cereals; pancakes; commercial bread stuffing; self-rising flower and biscuit mixes; regular bread crumbs or cracker crumbs  Desserts and Sweets Desserts and sweets mad with mild should be within allowance Instant pudding mixes and cake mixes  Fats Butter or margarine; vegetable oils; unsalted salad dressings, regular salad dressings limited  to 1 Tbs; light, sour and heavy cream Regular salad dressings containing bacon fat, bacon bits, and salt pork; snack dips made with instant soup mixes or processed cheese; salted nuts  Fruits Most fresh, frozen and canned fruits Fruits processed with salt or sodium-containing ingredient (some dried fruits are processed with sodium sulfites        Vegetables Fresh, frozen vegetables and low- sodium canned vegetables Regular canned vegetables, sauerkraut, pickled vegetables, and others prepared in brine; frozen vegetables in sauces; vegetables seasoned with ham, bacon or salt pork  Condiments, Sauces, Miscellaneous  Salt substitute with physician's approval; pepper, herbs, spices; vinegar, lemon or lime juice; hot pepper sauce; garlic powder, onion powder, low sodium soy sauce (1 Tbs.); low sodium condiments (ketchup, chili sauce, mustard) in limited amounts (1 tsp.) fresh ground horseradish; unsalted tortilla chips, pretzels, potato chips, popcorn, salsa (1/4 cup) Any seasoning made with salt including garlic salt, celery salt, onion salt, and seasoned salt; sea salt, rock salt, kosher salt; meat tenderizers; monosodium glutamate; mustard, regular soy sauce, barbecue, sauce, chili sauce, teriyaki sauce, steak sauce, Worcestershire sauce, and  most flavored vinegars; canned gravy and mixes; regular condiments; salted snack foods, olives, picles, relish, horseradish sauce, catsup   Food preparation: Try these seasonings Meats:    Pork Sage, onion Serve with applesauce  Chicken Poultry seasoning, thyme, parsley Serve with cranberry sauce  Lamb Curry powder, rosemary, garlic, thyme Serve with mint sauce or jelly  Veal Marjoram, basil Serve with current jelly, cranberry sauce  Beef Pepper, bay leaf Serve with dry mustard, unsalted chive butter  Fish [place] leaf, dill Serve with unsalted lemon butter, unsalted parsley butter  Vegetables:    Asparagus Lemon juice   Broccoli Lemon juice   Carrots Mustard  dressing parsley, mint, nutmeg, glazed with unsalted butter and sugar   Green beans Marjoram, lemon juice, nutmeg,dill seed   Tomatoes Basil, marjoram, onion   Spice /blend for [employer]" 4 tsp ground thyme 1 tsp ground sage 3 tsp ground rosemary 4 tsp ground marjoram   Test your knowledge 1. A product that says "Salt Free" may still contain sodium. True or False 2. Garlic Powder and Hot Pepper Sauce an be used as alternative seasonings.True or False 3. Processed foods have more sodium than fresh foods.  True or False 4. Canned Vegetables have less sodium than froze True or False  WAYS TO DECREASE YOUR SODIUM INTAKE 1. Avoid the use of added salt in cooking and at the table.  Table salt (and other prepared seasonings which contain salt) is probably one of the greatest sources of sodium in the diet.  Unsalted foods can gain flavor from the sweet, sour, and butter taste sensations of herbs and spices.  Instead of using salt for seasoning, try the following seasonings with the foods listed.  Remember: how you use them to enhance natural food flavors is limited only by your creativity... Allspice-Meat, fish, eggs, fruit, peas, red and yellow vegetables Almond Extract-Fruit baked goods Anise Seed-Sweet breads, fruit, carrots, beets, cottage cheese, cookies (tastes like licorice) Basil-Meat, fish, eggs, vegetables, rice, vegetables salads, soups, sauces Bay Leaf-Meat, fish, stews, poultry Burnet-Salad, vegetables (cucumber-like flavor) Caraway Seed-Bread, cookies, cottage cheese, meat, vegetables, cheese, rice Cardamon-Baked goods, fruit, soups Celery Powder or seed-Salads, salad dressings, sauces, meatloaf, soup, bread.Do not use  celery salt Chervil-Meats, salads, fish, eggs, vegetables, cottage cheese (parsley-like flavor) Chili Power-Meatloaf, chicken cheese, corn, eggplant, egg dishes Chives-Salads cottage cheese, egg dishes, soups, vegetables, sauces Cilantro-Salsa,  casseroles Cinnamon-Baked goods, fruit, pork, lamb, chicken, carrots Cloves-Fruit, baked goods, fish, pot roast, green beans, beets, carrots Coriander-Pastry, cookies, meat, salads, cheese (lemon-orange flavor) Cumin-Meatloaf, fish,cheese, eggs, cabbage,fruit pie (caraway flavor) [employer], fruit, eggs, fish, poultry, cottage cheese, vegetables Dill Seed-Meat, cottage cheese, poultry, vegetables, fish, salads, bread Fennel Seed-Bread, cookies, apples, pork, eggs, fish, beets, cabbage, cheese, Licorice-like flavor Garlic-(buds or powder) Salads, meat, poultry, fish, bread, butter, vegetables, potatoes.Do not  use garlic salt Ginger-Fruit, vegetables, baked goods, meat, fish, poultry Horseradish Root-Meet, vegetables, butter Lemon Juice or Extract-Vegetables, fruit, tea, baked goods, fish salads Mace-Baked goods fruit, vegetables, fish, poultry (taste like nutmeg) Maple Extract-Syrups Marjoram-Meat, chicken, fish, vegetables, breads, green salads (taste like Sage) Mint-Tea, lamb, sherbet, vegetables, desserts, carrots, cabbage Mustard, Dry or Seed-Cheese, eggs, meats, vegetables, poultry Nutmeg-Baked goods, fruit, chicken, eggs, vegetables, desserts Onion Powder-Meat, fish, poultry, vegetables, cheese, eggs, bread, rice salads (Do not use   Onion salt) Orange Extract-Desserts, baked goods Oregano-Pasta, eggs, cheese, onions, pork, lamb, fish, chicken, vegetables, green salads Paprika-Meat, fish, poultry, eggs, cheese, vegetables Parsley Flakes-Butter, vegetables, meat fish, poultry, eggs, bread, salads (certain forms may  Contain sodium Pepper-Meat fish, poultry, vegetables, eggs Peppermint Extract-Desserts, baked goods Poppy Seed-Eggs, bread, cheese, fruit dressings, baked goods, noodles, vegetables, cottage  [employer], poultry, meat, fish, cauliflower, turnips,eggs bread Saffron-Rice, bread, veal, chicken, fish, eggs Sage-Meat, fish,  poultry, onions, eggplant, tomateos, pork, stews Savory-Eggs, salads, poultry, meat, rice, vegetables, soups, pork Tarragon-Meat, poultry, fish, eggs, butter, vegetables (licorice-like flavor)  Thyme-Meat, poultry, fish, eggs, vegetables, (clover-like flavor), sauces, soups Tumeric-Salads, butter, eggs, fish, rice, vegetables (saffron-like flavor) Vanilla Extract-Baked goods, candy Vinegar-Salads, vegetables, meat marinades Walnut Extract-baked goods, candy  2. Choose your Foods Wisely   The following is a list of foods to avoid which are high in sodium:  Meats-Avoid all smoked, canned, salt cured, dried and kosher meat and fish as well as Anchovies   [employer] meats:Bologna, Liverwurst, Pastrami Canned meat or fish  Marinated herring Caviar    Pepperoni Corned Beef   Pizza Dried chipped beef  Salami Frozen breaded fish or meat Salt pork Frankfurters or hot dogs  Sardines Gefilte fish   Sausage Ham (boiled ham, Proscuitto Smoked butt    spiced ham)   Spam      TV Dinners Vegetables Canned vegetables (Regular) Relish Canned mushrooms  Sauerkraut Olives    Tomato juice Pickles  Bakery and Dessert Products Canned puddings  Cream pies Cheesecake   Decorated cakes Cookies  Beverages/Juices Tomato juice, regular  Gatorade   V-8 vegetable juice, regular  Breads and Cereals Biscuit mixes   Salted potato chips, corn chips, pretzels Bread stuffing mixes  Salted crackers and rolls Pancake and waffle mixes Self-rising flour  Seasonings Accent    Meat sauces Barbecue sauce  Meat tenderizer Catsup    Monosodium glutamate (MSG) Celery salt   Onion salt Chili sauce   Prepared mustard Garlic salt   Salt, seasoned salt, sea salt Gravy mixes   Soy sauce Horseradish   Steak sauce Ketchup   Tartar sauce Lite salt    Teriyaki sauce Marinade mixes   Worcestershire sauce  Others Baking powder   Cocoa and cocoa mixes Baking soda   Commercial casserole  mixes Candy-caramels, chocolate  Dehydrated soups    Bars, fudge,nougats  Instant rice and pasta mixes Canned broth or soup  Maraschino cherries Cheese, aged and processed cheese and cheese spreads  Learning Assessment Quiz  Indicated T (for True) or F (for False) for each of the following statements:  1. _____ Fresh fruits and vegetables and unprocessed grains are generally low in sodium 2. _____ Water may contain a considerable amount of sodium, depending on the source 3. _____ You can always tell if a food is high in sodium by tasting it 4. _____ Certain laxatives my be high in sodium and should be avoided unless prescribed   by a physician or pharmacist 5. _____ Salt substitutes may be used freely by anyone on a sodium restricted diet 6. _____ Sodium is present in table salt, food additives and as a natural component of   most foods 7. _____ Table salt is approximately 90% sodium 8. _____ Limiting sodium intake may help prevent excess fluid accumulation in the body 9. _____ On a sodium-restricted diet, seasonings such as bouillon soy sauce, and    cooking wine should be used in place of table salt 10. _____ On an ingredient list, a product which lists monosodium glutamate as the first   ingredient is an appropriate food to include on a low sodium diet  Circle the best answer(s)  to the following statements (Hint: there may be more than one correct answer)  11. On a low-sodium diet, some acceptable snack items are:    A. Olives  F. Bean dip   K. Grapefruit juice    B. Salted Pretzels G. Commercial Popcorn   L. Canned peaches    C. Carrot Sticks  H. Bouillon   M. Unsalted nuts   D. [place] fries  I. Peanut butter crackers N. Salami   E. Sweet pickles J. Tomato Juice   O. Pizza  12.  Seasonings that may be used freely on a reduced - sodium diet include   A. Lemon wedges F.Monosodium glutamate K. Celery seed    B.Soysauce   G. Pepper   L. Mustard powder   C. Sea salt  H.  Cooking wine  M. Onion flakes   D. Vinegar  E. Prepared horseradish N. Salsa   E. Sage   J. Worcestershire sauce  O. Chutney     Signed, [name], PA-C  08/08/2018 2:39 PM    [facility] [address], [place], [place]  [address] Phone: [phone]; Fax: [phone]

## 2018-08-08 NOTE — Patient Instructions (Addendum)
Medication Instructions:  Your physician recommends that you continue on your current medications as directed. Please refer to the Current Medication list given to you today.  If you need a refill on your cardiac medications before your next appointment, please call your pharmacy.   Lab work: None ordered If you have labs (blood work) drawn today and your tests are completely normal, you will receive your results only by: [facility] MyChart Message (if you have MyChart) OR . A paper copy in the mail If you have any lab test that is abnormal or we need to change your treatment, we will call you to review the results.  Testing/Procedures: None ordered  Follow-Up: Follow up with Dr. Radford Pax on 11/08/18 at 11:40 AM.   Any Other Special Instructions Will Be Listed Below (If Applicable).  1. Weigh yourself daily 2. Wear compression stockings 3. 2 GRAM Sodium Diet  Two Gram Sodium Diet 2000 mg  What is Sodium? Sodium is a mineral found naturally in many foods. The most significant source of sodium in the diet is table salt, which is about 40% sodium.  Processed, convenience, and preserved foods also contain a large amount of sodium.  The body needs only 500 mg of sodium daily to function,  A normal diet provides more than enough sodium even if you do not use salt.  Why Limit Sodium? A build up of sodium in the body can cause thirst, increased blood pressure, shortness of breath, and water retention.  Decreasing sodium in the diet can reduce edema and risk of heart attack or stroke associated with high blood pressure.  Keep in mind that there are many other factors involved in these health problems.  Heredity, obesity, lack of exercise, cigarette smoking, stress and what you eat all play a role.  General Guidelines:  Do not add salt at the table or in cooking.  One teaspoon of salt contains over 2 grams of sodium.  Read food labels  Avoid processed and convenience foods  Ask your dietitian before  eating any foods not dicussed in the menu planning guidelines  Consult your physician if you wish to use a salt substitute or a sodium containing medication such as antacids.  Limit milk and milk products to 16 oz (2 cups) per day.  Shopping Hints:  READ LABELS!! "Dietetic" does not necessarily mean low sodium.  Salt and other sodium ingredients are often added to foods during processing.   Menu Planning Guidelines Food Group Choose More Often Avoid  Beverages (see also the milk group All fruit juices, low-sodium, salt-free vegetables juices, low-sodium carbonated beverages Regular vegetable or tomato juices, commercially softened water used for drinking or cooking  Breads and Cereals Enriched white, wheat, rye and pumpernickel bread, hard rolls and dinner rolls; muffins, cornbread and waffles; most dry cereals, cooked cereal without added salt; unsalted crackers and breadsticks; low sodium or homemade bread crumbs Bread, rolls and crackers with salted tops; quick breads; instant hot cereals; pancakes; commercial bread stuffing; self-rising flower and biscuit mixes; regular bread crumbs or cracker crumbs  Desserts and Sweets Desserts and sweets mad with mild should be within allowance Instant pudding mixes and cake mixes  Fats Butter or margarine; vegetable oils; unsalted salad dressings, regular salad dressings limited to 1 Tbs; light, sour and heavy cream Regular salad dressings containing bacon fat, bacon bits, and salt pork; snack dips made with instant soup mixes or processed cheese; salted nuts  Fruits Most fresh, frozen and canned fruits Fruits processed with salt  or sodium-containing ingredient (some dried fruits are processed with sodium sulfites        Vegetables Fresh, frozen vegetables and low- sodium canned vegetables Regular canned vegetables, sauerkraut, pickled vegetables, and others prepared in brine; frozen vegetables in sauces; vegetables seasoned with ham, bacon or salt  pork  Condiments, Sauces, Miscellaneous  Salt substitute with physician's approval; pepper, herbs, spices; vinegar, lemon or lime juice; hot pepper sauce; garlic powder, onion powder, low sodium soy sauce (1 Tbs.); low sodium condiments (ketchup, chili sauce, mustard) in limited amounts (1 tsp.) fresh ground horseradish; unsalted tortilla chips, pretzels, potato chips, popcorn, salsa (1/4 cup) Any seasoning made with salt including garlic salt, celery salt, onion salt, and seasoned salt; sea salt, rock salt, kosher salt; meat tenderizers; monosodium glutamate; mustard, regular soy sauce, barbecue, sauce, chili sauce, teriyaki sauce, steak sauce, Worcestershire sauce, and most flavored vinegars; canned gravy and mixes; regular condiments; salted snack foods, olives, picles, relish, horseradish sauce, catsup   Food preparation: Try these seasonings Meats:    Pork Sage, onion Serve with applesauce  Chicken Poultry seasoning, thyme, parsley Serve with cranberry sauce  Lamb Curry powder, rosemary, garlic, thyme Serve with mint sauce or jelly  Veal Marjoram, basil Serve with current jelly, cranberry sauce  Beef Pepper, bay leaf Serve with dry mustard, unsalted chive butter  Fish [place] leaf, dill Serve with unsalted lemon butter, unsalted parsley butter  Vegetables:    Asparagus Lemon juice   Broccoli Lemon juice   Carrots Mustard dressing parsley, mint, nutmeg, glazed with unsalted butter and sugar   Green beans Marjoram, lemon juice, nutmeg,dill seed   Tomatoes Basil, marjoram, onion   Spice /blend for [employer]" 4 tsp ground thyme 1 tsp ground sage 3 tsp ground rosemary 4 tsp ground marjoram   Test your knowledge 1. A product that says "Salt Free" may still contain sodium. True or False 2. Garlic Powder and Hot Pepper Sauce an be used as alternative seasonings.True or False 3. Processed foods have more sodium than fresh foods.  True or False 4. Canned Vegetables have less sodium than froze  True or False  WAYS TO DECREASE YOUR SODIUM INTAKE 1. Avoid the use of added salt in cooking and at the table.  Table salt (and other prepared seasonings which contain salt) is probably one of the greatest sources of sodium in the diet.  Unsalted foods can gain flavor from the sweet, sour, and butter taste sensations of herbs and spices.  Instead of using salt for seasoning, try the following seasonings with the foods listed.  Remember: how you use them to enhance natural food flavors is limited only by your creativity... Allspice-Meat, fish, eggs, fruit, peas, red and yellow vegetables Almond Extract-Fruit baked goods Anise Seed-Sweet breads, fruit, carrots, beets, cottage cheese, cookies (tastes like licorice) Basil-Meat, fish, eggs, vegetables, rice, vegetables salads, soups, sauces Bay Leaf-Meat, fish, stews, poultry Burnet-Salad, vegetables (cucumber-like flavor) Caraway Seed-Bread, cookies, cottage cheese, meat, vegetables, cheese, rice Cardamon-Baked goods, fruit, soups Celery Powder or seed-Salads, salad dressings, sauces, meatloaf, soup, bread.Do not use  celery salt Chervil-Meats, salads, fish, eggs, vegetables, cottage cheese (parsley-like flavor) Chili Power-Meatloaf, chicken cheese, corn, eggplant, egg dishes Chives-Salads cottage cheese, egg dishes, soups, vegetables, sauces Cilantro-Salsa, casseroles Cinnamon-Baked goods, fruit, pork, lamb, chicken, carrots Cloves-Fruit, baked goods, fish, pot roast, green beans, beets, carrots Coriander-Pastry, cookies, meat, salads, cheese (lemon-orange flavor) Cumin-Meatloaf, fish,cheese, eggs, cabbage,fruit pie (caraway flavor) [employer], fruit, eggs, fish, poultry, cottage cheese, vegetables Dill Seed-Meat, cottage cheese, poultry, vegetables,  fish, salads, bread Fennel Seed-Bread, cookies, apples, pork, eggs, fish, beets, cabbage, cheese, Licorice-like flavor Garlic-(buds or powder) Salads, meat, poultry, fish, bread, butter,  vegetables, potatoes.Do not  use garlic salt Ginger-Fruit, vegetables, baked goods, meat, fish, poultry Horseradish Root-Meet, vegetables, butter Lemon Juice or Extract-Vegetables, fruit, tea, baked goods, fish salads Mace-Baked goods fruit, vegetables, fish, poultry (taste like nutmeg) Maple Extract-Syrups Marjoram-Meat, chicken, fish, vegetables, breads, green salads (taste like Sage) Mint-Tea, lamb, sherbet, vegetables, desserts, carrots, cabbage Mustard, Dry or Seed-Cheese, eggs, meats, vegetables, poultry Nutmeg-Baked goods, fruit, chicken, eggs, vegetables, desserts Onion Powder-Meat, fish, poultry, vegetables, cheese, eggs, bread, rice salads (Do not use   Onion salt) Orange Extract-Desserts, baked goods Oregano-Pasta, eggs, cheese, onions, pork, lamb, fish, chicken, vegetables, green salads Paprika-Meat, fish, poultry, eggs, cheese, vegetables Parsley Flakes-Butter, vegetables, meat fish, poultry, eggs, bread, salads (certain forms may   Contain sodium Pepper-Meat fish, poultry, vegetables, eggs Peppermint Extract-Desserts, baked goods Poppy Seed-Eggs, bread, cheese, fruit dressings, baked goods, noodles, vegetables, cottage  [employer], poultry, meat, fish, cauliflower, turnips,eggs bread Saffron-Rice, bread, veal, chicken, fish, eggs Sage-Meat, fish, poultry, onions, eggplant, tomateos, pork, stews Savory-Eggs, salads, poultry, meat, rice, vegetables, soups, pork Tarragon-Meat, poultry, fish, eggs, butter, vegetables (licorice-like flavor)  Thyme-Meat, poultry, fish, eggs, vegetables, (clover-like flavor), sauces, soups Tumeric-Salads, butter, eggs, fish, rice, vegetables (saffron-like flavor) Vanilla Extract-Baked goods, candy Vinegar-Salads, vegetables, meat marinades Walnut Extract-baked goods, candy  2. Choose your Foods Wisely   The following is a list of foods to avoid which are high in sodium:  Meats-Avoid all smoked,  canned, salt cured, dried and kosher meat and fish as well as Anchovies   [employer] meats:Bologna, Liverwurst, Pastrami Canned meat or fish  Marinated herring Caviar    Pepperoni Corned Beef   Pizza Dried chipped beef  Salami Frozen breaded fish or meat Salt pork Frankfurters or hot dogs  Sardines Gefilte fish   Sausage Ham (boiled ham, Proscuitto Smoked butt    spiced ham)   Spam      TV Dinners Vegetables Canned vegetables (Regular) Relish Canned mushrooms  Sauerkraut Olives    Tomato juice Pickles  Bakery and Dessert Products Canned puddings  Cream pies Cheesecake   Decorated cakes Cookies  Beverages/Juices Tomato juice, regular  Gatorade   V-8 vegetable juice, regular  Breads and Cereals Biscuit mixes   Salted potato chips, corn chips, pretzels Bread stuffing mixes  Salted crackers and rolls Pancake and waffle mixes Self-rising flour  Seasonings Accent    Meat sauces Barbecue sauce  Meat tenderizer Catsup    Monosodium glutamate (MSG) Celery salt   Onion salt Chili sauce   Prepared mustard Garlic salt   Salt, seasoned salt, sea salt Gravy mixes   Soy sauce Horseradish   Steak sauce Ketchup   Tartar sauce Lite salt    Teriyaki sauce Marinade mixes   Worcestershire sauce  Others Baking powder   Cocoa and cocoa mixes Baking soda   Commercial casserole mixes Candy-caramels, chocolate  Dehydrated soups    Bars, fudge,nougats  Instant rice and pasta mixes Canned broth or soup  Maraschino cherries Cheese, aged and processed cheese and cheese spreads  Learning Assessment Quiz  Indicated T (for True) or F (for False) for each of the following statements:  1. _____ Fresh fruits and vegetables and unprocessed grains are generally low in sodium 2. _____ Water may contain a considerable amount of sodium, depending on the source 3. _____ You can always tell if a  food is high in sodium by tasting it 4. _____ Certain laxatives my be high in sodium and  should be avoided unless prescribed   by a physician or pharmacist 5. _____ Salt substitutes may be used freely by anyone on a sodium restricted diet 6. _____ Sodium is present in table salt, food additives and as a natural component of   most foods 7. _____ Table salt is approximately 90% sodium 8. _____ Limiting sodium intake may help prevent excess fluid accumulation in the body 9. _____ On a sodium-restricted diet, seasonings such as bouillon soy sauce, and    cooking wine should be used in place of table salt 10. _____ On an ingredient list, a product which lists monosodium glutamate as the first   ingredient is an appropriate food to include on a low sodium diet  Circle the best answer(s) to the following statements (Hint: there may be more than one correct answer)  11. On a low-sodium diet, some acceptable snack items are:    A. Olives  F. Bean dip   K. Grapefruit juice    B. Salted Pretzels G. Commercial Popcorn   L. Canned peaches    C. Carrot Sticks  H. Bouillon   M. Unsalted nuts   D. [place] fries  I. Peanut butter crackers N. Salami   E. Sweet pickles J. Tomato Juice   O. Pizza  12.  Seasonings that may be used freely on a reduced - sodium diet include   A. Lemon wedges F.Monosodium glutamate K. Celery seed    B.Soysauce   G. Pepper   L. Mustard powder   C. Sea salt  H. Cooking wine  M. Onion flakes   D. Vinegar  E. Prepared horseradish N. Salsa   E. Sage   J. Worcestershire sauce  O. Chutney

## 2018-08-10 ENCOUNTER — Other Ambulatory Visit: Payer: Medicare Other

## 2018-08-10 DIAGNOSIS — I5042 Chronic combined systolic (congestive) and diastolic (congestive) heart failure: Secondary | ICD-10-CM | POA: Diagnosis not present

## 2018-08-10 DIAGNOSIS — K2971 Gastritis, unspecified, with bleeding: Secondary | ICD-10-CM

## 2018-08-10 DIAGNOSIS — D519 Vitamin B12 deficiency anemia, unspecified: Secondary | ICD-10-CM

## 2018-08-10 DIAGNOSIS — E1122 Type 2 diabetes mellitus with diabetic chronic kidney disease: Secondary | ICD-10-CM | POA: Diagnosis not present

## 2018-08-10 DIAGNOSIS — E0821 Diabetes mellitus due to underlying condition with diabetic nephropathy: Secondary | ICD-10-CM

## 2018-08-10 DIAGNOSIS — I130 Hypertensive heart and chronic kidney disease with heart failure and stage 1 through stage 4 chronic kidney disease, or unspecified chronic kidney disease: Secondary | ICD-10-CM | POA: Diagnosis not present

## 2018-08-10 DIAGNOSIS — N183 Chronic kidney disease, stage 3 (moderate): Secondary | ICD-10-CM | POA: Diagnosis not present

## 2018-08-10 DIAGNOSIS — D649 Anemia, unspecified: Secondary | ICD-10-CM | POA: Diagnosis not present

## 2018-08-10 DIAGNOSIS — D631 Anemia in chronic kidney disease: Secondary | ICD-10-CM | POA: Diagnosis not present

## 2018-08-10 DIAGNOSIS — I2510 Atherosclerotic heart disease of native coronary artery without angina pectoris: Secondary | ICD-10-CM | POA: Diagnosis not present

## 2018-08-13 ENCOUNTER — Ambulatory Visit (INDEPENDENT_AMBULATORY_CARE_PROVIDER_SITE_OTHER): Payer: Medicare Other | Admitting: Gastroenterology

## 2018-08-13 ENCOUNTER — Telehealth: Payer: Self-pay | Admitting: Gastroenterology

## 2018-08-13 ENCOUNTER — Encounter: Payer: Self-pay | Admitting: Gastroenterology

## 2018-08-13 VITALS — BP 120/70 | HR 78 | Ht 70.0 in | Wt 221.0 lb

## 2018-08-13 DIAGNOSIS — Z800 Family history of malignant neoplasm of digestive organs: Secondary | ICD-10-CM | POA: Diagnosis not present

## 2018-08-13 DIAGNOSIS — N183 Chronic kidney disease, stage 3 (moderate): Secondary | ICD-10-CM | POA: Diagnosis not present

## 2018-08-13 DIAGNOSIS — K253 Acute gastric ulcer without hemorrhage or perforation: Secondary | ICD-10-CM

## 2018-08-13 DIAGNOSIS — E538 Deficiency of other specified B group vitamins: Secondary | ICD-10-CM | POA: Insufficient documentation

## 2018-08-13 DIAGNOSIS — I130 Hypertensive heart and chronic kidney disease with heart failure and stage 1 through stage 4 chronic kidney disease, or unspecified chronic kidney disease: Secondary | ICD-10-CM | POA: Diagnosis not present

## 2018-08-13 DIAGNOSIS — E1122 Type 2 diabetes mellitus with diabetic chronic kidney disease: Secondary | ICD-10-CM | POA: Diagnosis not present

## 2018-08-13 DIAGNOSIS — D631 Anemia in chronic kidney disease: Secondary | ICD-10-CM | POA: Diagnosis not present

## 2018-08-13 DIAGNOSIS — I2510 Atherosclerotic heart disease of native coronary artery without angina pectoris: Secondary | ICD-10-CM | POA: Diagnosis not present

## 2018-08-13 DIAGNOSIS — I5042 Chronic combined systolic (congestive) and diastolic (congestive) heart failure: Secondary | ICD-10-CM | POA: Diagnosis not present

## 2018-08-13 LAB — METHYLMALONIC ACID, SERUM: Methylmalonic Acid, Quant: 387 nmol/L — ABNORMAL HIGH (ref 87–318)

## 2018-08-13 LAB — INTRINSIC FACTOR ANTIBODIES: Intrinsic Factor: NEGATIVE

## 2018-08-13 LAB — HOMOCYSTEINE: Homocysteine: 20.6 umol/L — ABNORMAL HIGH (ref <11.4)

## 2018-08-13 NOTE — Telephone Encounter (Signed)
Naomi from [facility] calling asking for name of protein supplement that Dr. Tarri Glenn wants pt to take. She stated that it needs to be written down or pharmacy will not refill it. Pls fax it to [phone].

## 2018-08-13 NOTE — Telephone Encounter (Signed)
Amino Acids-Protein Hydrolys (FEEDING SUPPLEMENT, PRO-STAT SUGAR FREE 64,) LIQD [[id number]]   It is already on his MAR, as it was ordered on discharge from the hospital. He may have a refill based on that prior prescription. Thank you.

## 2018-08-13 NOTE — Telephone Encounter (Signed)
Please advise, thank you.

## 2018-08-13 NOTE — Progress Notes (Signed)
Referring Provider: Thornton Park, MD Primary Care Physician:  [name], MD  Chief Complaint: hospital follow-up  IMPRESSION:  Gastric ulcer On EGD 07/28/2018    - H pylori negative    - biopsies showed: gastric oxyntic mucosa with parietal cell hyperplasia, mild chronic gastritis    - intrinsic factor negative, methylmalonic acid 387 B12 deficiency    - methylmalonic acid elevated at 387, homocysteine elevated 20.6 Recent bleeding at the ostomy    - treated with silver nitrate 06/2018 Family history of colon cancer (mother at age 25 and sister at age 33)  Clinically improved. Not interested in repeat EGD recommended during his recent hospitalization to follow-up on GU.   Recent labs confirm B12 deficiency. Will start B12 replacement.   PLAN: Fasting gastrin level (patient would like to have it done during his appointment Dr. Lysle Rubens tomorrow) Hemoglobin check with labs tomorrow [id number] mcg daily Consider repeat EGD in the future Return in 4-5 months, return earlier if needed Continue pantoprazole once daily indefiniately RTC in 3-4 months   HPI: [name] [name] is a 84 y.o. male returns in hospital follow-up. His sister accompanies him to this appointment. The history is obtained through the patient, review of his electronic health record, and his sister.  Currently living at a nursing facility.  He is currently living in a nursing facility. Recent GI records from his hospitalization are summarized in the problem list above.   No more abdominal pain. No further bleeding except for one episode in the ostomy bag. He is not interested in trying alternative appliances because he appreciates the bigger, tinted bag. Bowel habits are at baseline.  There is no dysphagia, odynophagia, regurgitation,  heartburn, nausea, abdominal pain, change in bowel habits, melena, hematochezia, or bright red blood per rectum. There is no anorexia or recent change in weight.    Labs  07/28/18 show a hemoglobin 8.8. Additional labs show methylmalonic acid elevated at 387, homocysteine elevated 20.6  Past Medical History:  Diagnosis Date  . Allergic reaction caused by a drug 11/2017  . Anemia   . Atrial flutter ([place])    a. s/p RFCA of counterclockwise cavo-tricuspid isthmus dependent atrial flutter 2009 by Dr. Rayann Heman.  Marland Kitchen CAD (coronary artery disease) 06/2014   a. 06/2014: abnl nuc. Cath: Totally occluded LAD with faint collaterals (not a candidate for CTO PCI), 90% ramus s/p DES, 50-70% OM2.  . Chronic diastolic CHF (congestive heart failure) ([place])    a. 2D Echo 10/23/13: EF 50-55%, basal mid-inf HK, grade 2 DD, mild MR, mod dilated LA, no sig change from prior.  . CKD (chronic kidney disease), stage III ([place])   . Colostomy in place [facility])   . Complication of anesthesia    " during my kidney stone surgery my heart went out of rhythm  . Compression fracture of L1 lumbar vertebra ([name])   . DCM (dilated cardiomyopathy) ([place]) 02/27/2018   EF 35-40% by TEE 02/2018 felt tachycardia mediated  . Diabetes ([place])    TYPE 2   . Dyslipidemia   . Dyspnea   . Frequent PVCs   . GERD (gastroesophageal reflux disease)   . History of blood transfusion   . History of kidney stones   . History of peptic ulcer disease   . HTN (hypertension)   . Hx of acute renal failure 01/02/10-3/24-11   due to hypovolemic shock, gastroenteritis and dehydration,hospitalized . Did requre a few days of dialysis. Cr at discharge was 1.8  . Kidney disease   .  Kidney stone may 2009   Right hydronephrosis, S/P stone removal  . Lower back pain   . Macular degeneration   . OA (osteoarthritis) of hip   . Obesity   . Osteopenia   . PAF (paroxysmal atrial fibrillation) ([name])    not on long term anticoagulation due to history of heme positive stools and anemia  . Shingles    episode  . Small bowel obstruction, partial ([place]) 2009   Episode  . Ulcerative colitis ([place])    a. s/p total colectomy remotely.     Past Surgical History:  Procedure Laterality Date  . BACK SURGERY    . BIOPSY  07/28/2018   Procedure: BIOPSY;  Surgeon: Thornton Park, MD;  Location: [facility];  Service: Gastroenterology;;  . CARDIAC CATHETERIZATION  06/19/2014  . CARDIOVERSION N/A 02/23/2018   Procedure: CARDIOVERSION;  Surgeon: Dorothy Spark, MD;  Location: [facility] ENDOSCOPY;  Service: Cardiovascular;  Laterality: N/A;  . COLON RESECTION    . COLOSTOMY    . CORONARY ANGIOPLASTY    . CORONARY STENT PLACEMENT  06/19/2014   DES       dr Martinique  . ESOPHAGOGASTRODUODENOSCOPY (EGD) WITH PROPOFOL N/A 07/28/2018   Procedure: ESOPHAGOGASTRODUODENOSCOPY (EGD) WITH PROPOFOL;  Surgeon: Thornton Park, MD;  Location: [facility];  Service: Gastroenterology;  Laterality: N/A;  . HARDWARE REMOVAL Left 12/01/2017   Procedure: HARDWARE REMOVAL LEFT ANKLE;  Surgeon: Newt Minion, MD;  Location: [facility];  Service: Orthopedics;  Laterality: Left;  . HERNIA REPAIR    . LEFT AND RIGHT HEART CATHETERIZATION WITH CORONARY ANGIOGRAM N/A 06/19/2014   Procedure: LEFT AND RIGHT HEART CATHETERIZATION WITH CORONARY ANGIOGRAM;  Surgeon: Peter M Martinique, MD;  Location: [facility] CATH LAB;  Service: Cardiovascular;  Laterality: N/A;  . ORIF ANKLE FRACTURE Left 10/25/2017   Procedure: OPEN REDUCTION INTERNAL FIXATION (ORIF) LEFT ANKLE FRACTURE;  Surgeon: Newt Minion, MD;  Location: [facility];  Service: Orthopedics;  Laterality: Left;  . TEE WITHOUT CARDIOVERSION N/A 02/23/2018   Procedure: TRANSESOPHAGEAL ECHOCARDIOGRAM (TEE);  Surgeon: Dorothy Spark, MD;  Location: [facility] ENDOSCOPY;  Service: Cardiovascular;  Laterality: N/A;  . TONSILLECTOMY      Prior to Admission medications   Medication Sig Start Date End Date Taking? Authorizing Provider  acetaminophen (TYLENOL) 325 MG tablet Take 2 tablets (650 mg total) by mouth every 6 (six) hours as needed for mild pain, moderate pain or headache. 03/29/18   Hongalgi, Lenis Dickinson, MD  Amino Acids-Protein  Hydrolys (FEEDING SUPPLEMENT, PRO-STAT SUGAR FREE 64,) LIQD Take 30 mLs by mouth daily.    [provider]  amiodarone (PACERONE) 200 MG tablet Take 2 tablets (400 mg total) by mouth as directed. BEGINNING ON 05/13/18 DECREASE TO 200 MG DAILY Patient taking differently: Take 200 mg by mouth daily.  04/24/18   [name] T, PA-C  apixaban (ELIQUIS) 2.5 MG TABS tablet Take 1 tablet (2.5 mg total) by mouth 2 (two) times daily. 07/29/18   Ghimire, Henreitta Leber, MD  atorvastatin (LIPITOR) 40 MG tablet Take 1 tablet (40 mg total) by mouth daily at 6 PM. 03/29/18   Hongalgi, Lenis Dickinson, MD  calcium carbonate (OS-CAL) 600 MG TABS tablet Take 600 mg by mouth daily with breakfast.    [provider]  Dermatological Products, Misc. (MOISTURE BARRIER) OINT Apply 1 application topically 3 (three) times daily. To buttocks/sacrum    [provider]  docusate sodium (COLACE) 100 MG capsule Take 100 mg by mouth 2 (two) times daily.  [provider]  furosemide (LASIX) 40 MG tablet Take 1.5 tablets (60 mg total) by mouth 2 (two) times daily. 07/17/18 10/15/18  Lyda Jester M, PA-C  glimepiride (AMARYL) 1 MG tablet Take 1 mg by mouth 2 (two) times daily with a meal.     [provider]  Multiple Vitamin (MULTIVITAMIN WITH MINERALS) TABS tablet Take 1 tablet by mouth daily.    [provider]  pantoprazole (PROTONIX) 40 MG tablet Take 1 tablet (40 mg total) by mouth 2 (two) times daily. 07/28/18   Ghimire, Henreitta Leber, MD  saxagliptin HCl (ONGLYZA) 2.5 MG TABS tablet Take 2.5 mg by mouth daily.    [provider]    Current Outpatient Medications  Medication Sig Dispense Refill  . acetaminophen (TYLENOL) 325 MG tablet Take 2 tablets (650 mg total) by mouth every 6 (six) hours as needed for mild pain, moderate pain or headache.    . Amino Acids-Protein Hydrolys (FEEDING SUPPLEMENT, PRO-STAT SUGAR FREE 64,) LIQD Take 30 mLs by mouth daily.    Marland Kitchen amiodarone  (PACERONE) 200 MG tablet Take 2 tablets (400 mg total) by mouth as directed. BEGINNING ON 05/13/18 DECREASE TO 200 MG DAILY (Patient taking differently: Take 200 mg by mouth daily. )    . apixaban (ELIQUIS) 2.5 MG TABS tablet Take 1 tablet (2.5 mg total) by mouth 2 (two) times daily. 60 tablet   . atorvastatin (LIPITOR) 40 MG tablet Take 1 tablet (40 mg total) by mouth daily at 6 PM. 30 tablet 0  . calcium carbonate (OS-CAL) 600 MG TABS tablet Take 600 mg by mouth daily with breakfast.    . Dermatological Products, Misc. (MOISTURE BARRIER) OINT Apply 1 application topically 3 (three) times daily. To buttocks/sacrum    . docusate sodium (COLACE) 100 MG capsule Take 100 mg by mouth 2 (two) times daily.    . furosemide (LASIX) 40 MG tablet Take 1.5 tablets (60 mg total) by mouth 2 (two) times daily. 90 tablet 3  . glimepiride (AMARYL) 1 MG tablet Take 1 mg by mouth 2 (two) times daily with a meal.     . Multiple Vitamin (MULTIVITAMIN WITH MINERALS) TABS tablet Take 1 tablet by mouth daily.    . pantoprazole (PROTONIX) 40 MG tablet Take 1 tablet (40 mg total) by mouth 2 (two) times daily. 60 tablet 0  . saxagliptin HCl (ONGLYZA) 2.5 MG TABS tablet Take 2.5 mg by mouth daily.     No current facility-administered medications for this visit.     Allergies as of 08/13/2018 - Review Complete 08/08/2018  Allergen Reaction Noted  . Brilinta [ticagrelor] Shortness Of Breath and Other (See Comments) 11/05/2014  . Clindamycin/lincomycin Anaphylaxis and Other (See Comments) 12/11/2017  . Doxycycline Anaphylaxis 12/05/2017  . Penicillins Rash and Other (See Comments) 10/02/2013  . Broccoli [brassica oleracea italica]  41/28/7867  . Sulfa antibiotics Other (See Comments) 10/02/2013  . Tape Rash 07/26/2018    Family History  Problem Relation Age of Onset  . Colon cancer Mother     Social History   Socioeconomic History  . Marital status: Widowed    Spouse name: Not on file  . Number of children: Not  on file  . Years of education: Not on file  . Highest education level: Not on file  Occupational History  . Not on file  Social Needs  . Financial resource strain: Not on file  . Food insecurity:    Worry: Not on file    Inability: Not  on file  . Transportation needs:    Medical: Not on file    Non-medical: Not on file  Tobacco Use  . Smoking status: Never Smoker  . Smokeless tobacco: Never Used  Substance and Sexual Activity  . Alcohol use: No  . Drug use: No  . Sexual activity: Not on file  Lifestyle  . Physical activity:    Days per week: Not on file    Minutes per session: Not on file  . Stress: Not on file  Relationships  . Social connections:    Talks on phone: Not on file    Gets together: Not on file    Attends religious service: Not on file    Active member of club or organization: Not on file    Attends meetings of clubs or organizations: Not on file    Relationship status: Not on file  . Intimate partner violence:    Fear of current or ex partner: Not on file    Emotionally abused: Not on file    Physically abused: Not on file    Forced sexual activity: Not on file  Other Topics Concern  . Not on file  Social History Narrative  . Not on file    Review of Systems: 12 system ROS is negative except as noted above.   Physical Exam: Vital signs were reviewed. General:   Alert, well-nourished, pleasant and cooperative in NAD. Appears his stated age.  Head:  Normocephalic and atraumatic. Eyes:  Sclera clear, no icterus.   Conjunctiva pink. Mouth:  No deformity or lesions.   Neck:  Supple; no thyromegaly. Lungs:  Clear throughout to auscultation.   No wheezes.  Heart:  Regular rate and rhythm; no murmurs Abdomen:  Soft, nontender, normal bowel sounds. Ostomy present. No rebound or guarding. No hepatosplenomegaly Rectal:  Deferred  Msk:  Symmetrical without gross deformities. Extremities:  No gross deformities or edema. Neurologic:  Alert and  oriented  x4;  grossly nonfocal Skin:  No rash or bruise. Psych:  Alert and cooperative. Normal mood and affect.   Quinnlyn Hearns L. Tarri Glenn Md, MPH [facility] 08/13/2018, 8:24 AM

## 2018-08-13 NOTE — Patient Instructions (Addendum)
Have your lab work done tomorrow at Dr. Sherilyn Cooter office. Remember to be fasting, nothing after midnight.   Return to clinic in 4-5 months to see Dr. Tarri Glenn, call back for an appointment. Consider repeat EGD in the future.   Continue Pantoprazole once daily.

## 2018-08-13 NOTE — Telephone Encounter (Signed)
Faxed information to [facility].

## 2018-08-14 ENCOUNTER — Telehealth: Payer: Self-pay | Admitting: Gastroenterology

## 2018-08-14 ENCOUNTER — Other Ambulatory Visit: Payer: Self-pay

## 2018-08-14 DIAGNOSIS — K279 Peptic ulcer, site unspecified, unspecified as acute or chronic, without hemorrhage or perforation: Secondary | ICD-10-CM | POA: Diagnosis not present

## 2018-08-14 DIAGNOSIS — K259 Gastric ulcer, unspecified as acute or chronic, without hemorrhage or perforation: Secondary | ICD-10-CM | POA: Diagnosis not present

## 2018-08-14 DIAGNOSIS — D649 Anemia, unspecified: Secondary | ICD-10-CM | POA: Diagnosis not present

## 2018-08-14 MED ORDER — VITAMIN B-12 1000 MCG PO TABS: 1000.0000 ug | ORAL_TABLET | Freq: Every day | ORAL | 3 refills | Status: DC

## 2018-08-15 DIAGNOSIS — E1122 Type 2 diabetes mellitus with diabetic chronic kidney disease: Secondary | ICD-10-CM | POA: Diagnosis not present

## 2018-08-15 DIAGNOSIS — I130 Hypertensive heart and chronic kidney disease with heart failure and stage 1 through stage 4 chronic kidney disease, or unspecified chronic kidney disease: Secondary | ICD-10-CM | POA: Diagnosis not present

## 2018-08-15 DIAGNOSIS — I5042 Chronic combined systolic (congestive) and diastolic (congestive) heart failure: Secondary | ICD-10-CM | POA: Diagnosis not present

## 2018-08-15 DIAGNOSIS — N183 Chronic kidney disease, stage 3 (moderate): Secondary | ICD-10-CM | POA: Diagnosis not present

## 2018-08-15 DIAGNOSIS — D631 Anemia in chronic kidney disease: Secondary | ICD-10-CM | POA: Diagnosis not present

## 2018-08-15 DIAGNOSIS — I2510 Atherosclerotic heart disease of native coronary artery without angina pectoris: Secondary | ICD-10-CM | POA: Diagnosis not present

## 2018-08-17 DIAGNOSIS — F4323 Adjustment disorder with mixed anxiety and depressed mood: Secondary | ICD-10-CM | POA: Diagnosis not present

## 2018-08-21 ENCOUNTER — Other Ambulatory Visit: Payer: Self-pay | Admitting: Gastroenterology

## 2018-08-21 DIAGNOSIS — I2510 Atherosclerotic heart disease of native coronary artery without angina pectoris: Secondary | ICD-10-CM | POA: Diagnosis not present

## 2018-08-21 DIAGNOSIS — D631 Anemia in chronic kidney disease: Secondary | ICD-10-CM | POA: Diagnosis not present

## 2018-08-21 DIAGNOSIS — N183 Chronic kidney disease, stage 3 (moderate): Secondary | ICD-10-CM | POA: Diagnosis not present

## 2018-08-21 DIAGNOSIS — I5042 Chronic combined systolic (congestive) and diastolic (congestive) heart failure: Secondary | ICD-10-CM | POA: Diagnosis not present

## 2018-08-21 DIAGNOSIS — E1122 Type 2 diabetes mellitus with diabetic chronic kidney disease: Secondary | ICD-10-CM | POA: Diagnosis not present

## 2018-08-21 DIAGNOSIS — I130 Hypertensive heart and chronic kidney disease with heart failure and stage 1 through stage 4 chronic kidney disease, or unspecified chronic kidney disease: Secondary | ICD-10-CM | POA: Diagnosis not present

## 2018-08-21 NOTE — Telephone Encounter (Signed)
Naomi from [facility], where patient resides; calling to state patient had abnormal labs with Dr.Hussain's office and want Dr.Beaver to be notified. [facility] did not have the lab results but states we can get them from Dr.Hussain's office is need be. FYI

## 2018-08-22 DIAGNOSIS — B351 Tinea unguium: Secondary | ICD-10-CM | POA: Diagnosis not present

## 2018-08-22 DIAGNOSIS — M79671 Pain in right foot: Secondary | ICD-10-CM | POA: Diagnosis not present

## 2018-08-22 DIAGNOSIS — M79672 Pain in left foot: Secondary | ICD-10-CM | POA: Diagnosis not present

## 2018-08-22 DIAGNOSIS — Q845 Enlarged and hypertrophic nails: Secondary | ICD-10-CM | POA: Diagnosis not present

## 2018-08-22 DIAGNOSIS — L603 Nail dystrophy: Secondary | ICD-10-CM | POA: Diagnosis not present

## 2018-08-22 DIAGNOSIS — I739 Peripheral vascular disease, unspecified: Secondary | ICD-10-CM | POA: Diagnosis not present

## 2018-08-22 DIAGNOSIS — E1159 Type 2 diabetes mellitus with other circulatory complications: Secondary | ICD-10-CM | POA: Diagnosis not present

## 2018-08-22 NOTE — Telephone Encounter (Signed)
Called [facility] and left a message for Delcie Roch with our fax number to have recent labs sent to Dr. Payton Emerald.

## 2018-08-23 DIAGNOSIS — I5042 Chronic combined systolic (congestive) and diastolic (congestive) heart failure: Secondary | ICD-10-CM | POA: Diagnosis not present

## 2018-08-23 DIAGNOSIS — I2510 Atherosclerotic heart disease of native coronary artery without angina pectoris: Secondary | ICD-10-CM | POA: Diagnosis not present

## 2018-08-23 DIAGNOSIS — D631 Anemia in chronic kidney disease: Secondary | ICD-10-CM | POA: Diagnosis not present

## 2018-08-23 DIAGNOSIS — N183 Chronic kidney disease, stage 3 (moderate): Secondary | ICD-10-CM | POA: Diagnosis not present

## 2018-08-23 DIAGNOSIS — E1122 Type 2 diabetes mellitus with diabetic chronic kidney disease: Secondary | ICD-10-CM | POA: Diagnosis not present

## 2018-08-23 DIAGNOSIS — I130 Hypertensive heart and chronic kidney disease with heart failure and stage 1 through stage 4 chronic kidney disease, or unspecified chronic kidney disease: Secondary | ICD-10-CM | POA: Diagnosis not present

## 2018-08-27 DIAGNOSIS — I130 Hypertensive heart and chronic kidney disease with heart failure and stage 1 through stage 4 chronic kidney disease, or unspecified chronic kidney disease: Secondary | ICD-10-CM | POA: Diagnosis not present

## 2018-08-27 DIAGNOSIS — I5042 Chronic combined systolic (congestive) and diastolic (congestive) heart failure: Secondary | ICD-10-CM | POA: Diagnosis not present

## 2018-08-27 DIAGNOSIS — E1122 Type 2 diabetes mellitus with diabetic chronic kidney disease: Secondary | ICD-10-CM | POA: Diagnosis not present

## 2018-08-27 DIAGNOSIS — I2510 Atherosclerotic heart disease of native coronary artery without angina pectoris: Secondary | ICD-10-CM | POA: Diagnosis not present

## 2018-08-27 DIAGNOSIS — N183 Chronic kidney disease, stage 3 (moderate): Secondary | ICD-10-CM | POA: Diagnosis not present

## 2018-08-27 DIAGNOSIS — D631 Anemia in chronic kidney disease: Secondary | ICD-10-CM | POA: Diagnosis not present

## 2018-08-28 ENCOUNTER — Telehealth: Payer: Self-pay

## 2018-08-28 NOTE — Telephone Encounter (Signed)
-----   Message from Thornton Park, MD sent at 08/21/2018  3:02 PM EST ----- Almyra Free,  Please call the patient. I have reviewed the recent blood test that he had drawn. His gastrin level is very high. This should be reassessed off of his PPI. I recommend that he completes 10 weeks of the PPI as previously prescribed, followed by a PPI taper. Then, we should plan a repeat EGD with additional gastric biopsies and pH testing as well as a repeat fastin gastrin level. I know that he didn't want to have another EGD but, given these results, he needs one. I had previously introduced the idea that this may be necessary. Please schedule.   Thank you.

## 2018-08-28 NOTE — Telephone Encounter (Signed)
Patient resides at [facility], will fax this information over to their facility.

## 2018-08-30 DIAGNOSIS — E1122 Type 2 diabetes mellitus with diabetic chronic kidney disease: Secondary | ICD-10-CM | POA: Diagnosis not present

## 2018-08-30 DIAGNOSIS — I2510 Atherosclerotic heart disease of native coronary artery without angina pectoris: Secondary | ICD-10-CM | POA: Diagnosis not present

## 2018-08-30 DIAGNOSIS — I130 Hypertensive heart and chronic kidney disease with heart failure and stage 1 through stage 4 chronic kidney disease, or unspecified chronic kidney disease: Secondary | ICD-10-CM | POA: Diagnosis not present

## 2018-08-30 DIAGNOSIS — D631 Anemia in chronic kidney disease: Secondary | ICD-10-CM | POA: Diagnosis not present

## 2018-08-30 DIAGNOSIS — N183 Chronic kidney disease, stage 3 (moderate): Secondary | ICD-10-CM | POA: Diagnosis not present

## 2018-08-30 DIAGNOSIS — I5042 Chronic combined systolic (congestive) and diastolic (congestive) heart failure: Secondary | ICD-10-CM | POA: Diagnosis not present

## 2018-09-03 ENCOUNTER — Telehealth: Payer: Self-pay

## 2018-09-03 DIAGNOSIS — I2510 Atherosclerotic heart disease of native coronary artery without angina pectoris: Secondary | ICD-10-CM | POA: Diagnosis not present

## 2018-09-03 DIAGNOSIS — N183 Chronic kidney disease, stage 3 (moderate): Secondary | ICD-10-CM | POA: Diagnosis not present

## 2018-09-03 DIAGNOSIS — E1122 Type 2 diabetes mellitus with diabetic chronic kidney disease: Secondary | ICD-10-CM | POA: Diagnosis not present

## 2018-09-03 DIAGNOSIS — I130 Hypertensive heart and chronic kidney disease with heart failure and stage 1 through stage 4 chronic kidney disease, or unspecified chronic kidney disease: Secondary | ICD-10-CM | POA: Diagnosis not present

## 2018-09-03 DIAGNOSIS — I5042 Chronic combined systolic (congestive) and diastolic (congestive) heart failure: Secondary | ICD-10-CM | POA: Diagnosis not present

## 2018-09-03 DIAGNOSIS — D631 Anemia in chronic kidney disease: Secondary | ICD-10-CM | POA: Diagnosis not present

## 2018-09-03 NOTE — Telephone Encounter (Signed)
-----   Message from Thornton Park, MD sent at 08/31/2018  8:06 PM EST ----- Please place him in recall for an office visit in 10 weeks. Thanks!  ----- Message ----- From: Wyatt Haste, RN Sent: 08/31/2018   4:20 PM EST To: Thornton Park, MD  Hi, You had sent me a note on this patient re: high gastrin level, continue PPI for 10 weeks and then repeat EGD with gastric biopsies and pH testing. My concern is he resides in a skilled nursing home so am only able to fax orders or sometimes talk to the nurse there. I don't want him falling through the cracks, especially if he didn't really want another EGD. Wondering if you want me to put a recall out there to schedule a follow up office visit for him in January?  You might want to put a reminder to yourself in the system to follow up on him as I do not know who will be helping you in the future :( Almyra Free

## 2018-09-03 NOTE — Telephone Encounter (Signed)
Recall placed for 10 week office follow up.

## 2018-09-08 DIAGNOSIS — I2510 Atherosclerotic heart disease of native coronary artery without angina pectoris: Secondary | ICD-10-CM | POA: Diagnosis not present

## 2018-09-08 DIAGNOSIS — I5042 Chronic combined systolic (congestive) and diastolic (congestive) heart failure: Secondary | ICD-10-CM | POA: Diagnosis not present

## 2018-09-08 DIAGNOSIS — N183 Chronic kidney disease, stage 3 (moderate): Secondary | ICD-10-CM | POA: Diagnosis not present

## 2018-09-08 DIAGNOSIS — D631 Anemia in chronic kidney disease: Secondary | ICD-10-CM | POA: Diagnosis not present

## 2018-09-08 DIAGNOSIS — E1122 Type 2 diabetes mellitus with diabetic chronic kidney disease: Secondary | ICD-10-CM | POA: Diagnosis not present

## 2018-09-08 DIAGNOSIS — I130 Hypertensive heart and chronic kidney disease with heart failure and stage 1 through stage 4 chronic kidney disease, or unspecified chronic kidney disease: Secondary | ICD-10-CM | POA: Diagnosis not present

## 2018-09-10 DIAGNOSIS — E1122 Type 2 diabetes mellitus with diabetic chronic kidney disease: Secondary | ICD-10-CM | POA: Diagnosis not present

## 2018-09-10 DIAGNOSIS — N183 Chronic kidney disease, stage 3 (moderate): Secondary | ICD-10-CM | POA: Diagnosis not present

## 2018-09-10 DIAGNOSIS — I5042 Chronic combined systolic (congestive) and diastolic (congestive) heart failure: Secondary | ICD-10-CM | POA: Diagnosis not present

## 2018-09-10 DIAGNOSIS — I130 Hypertensive heart and chronic kidney disease with heart failure and stage 1 through stage 4 chronic kidney disease, or unspecified chronic kidney disease: Secondary | ICD-10-CM | POA: Diagnosis not present

## 2018-09-10 DIAGNOSIS — I2510 Atherosclerotic heart disease of native coronary artery without angina pectoris: Secondary | ICD-10-CM | POA: Diagnosis not present

## 2018-09-10 DIAGNOSIS — D631 Anemia in chronic kidney disease: Secondary | ICD-10-CM | POA: Diagnosis not present

## 2018-09-12 DIAGNOSIS — I5042 Chronic combined systolic (congestive) and diastolic (congestive) heart failure: Secondary | ICD-10-CM | POA: Diagnosis not present

## 2018-09-12 DIAGNOSIS — N183 Chronic kidney disease, stage 3 (moderate): Secondary | ICD-10-CM | POA: Diagnosis not present

## 2018-09-12 DIAGNOSIS — I130 Hypertensive heart and chronic kidney disease with heart failure and stage 1 through stage 4 chronic kidney disease, or unspecified chronic kidney disease: Secondary | ICD-10-CM | POA: Diagnosis not present

## 2018-09-12 DIAGNOSIS — E1122 Type 2 diabetes mellitus with diabetic chronic kidney disease: Secondary | ICD-10-CM | POA: Diagnosis not present

## 2018-09-12 DIAGNOSIS — D631 Anemia in chronic kidney disease: Secondary | ICD-10-CM | POA: Diagnosis not present

## 2018-09-12 DIAGNOSIS — I2510 Atherosclerotic heart disease of native coronary artery without angina pectoris: Secondary | ICD-10-CM | POA: Diagnosis not present

## 2018-09-18 DIAGNOSIS — N183 Chronic kidney disease, stage 3 (moderate): Secondary | ICD-10-CM | POA: Diagnosis not present

## 2018-09-18 DIAGNOSIS — I2510 Atherosclerotic heart disease of native coronary artery without angina pectoris: Secondary | ICD-10-CM | POA: Diagnosis not present

## 2018-09-18 DIAGNOSIS — D631 Anemia in chronic kidney disease: Secondary | ICD-10-CM | POA: Diagnosis not present

## 2018-09-18 DIAGNOSIS — I5042 Chronic combined systolic (congestive) and diastolic (congestive) heart failure: Secondary | ICD-10-CM | POA: Diagnosis not present

## 2018-09-18 DIAGNOSIS — E1122 Type 2 diabetes mellitus with diabetic chronic kidney disease: Secondary | ICD-10-CM | POA: Diagnosis not present

## 2018-09-18 DIAGNOSIS — I130 Hypertensive heart and chronic kidney disease with heart failure and stage 1 through stage 4 chronic kidney disease, or unspecified chronic kidney disease: Secondary | ICD-10-CM | POA: Diagnosis not present

## 2018-09-21 DIAGNOSIS — I130 Hypertensive heart and chronic kidney disease with heart failure and stage 1 through stage 4 chronic kidney disease, or unspecified chronic kidney disease: Secondary | ICD-10-CM | POA: Diagnosis not present

## 2018-09-21 DIAGNOSIS — N183 Chronic kidney disease, stage 3 (moderate): Secondary | ICD-10-CM | POA: Diagnosis not present

## 2018-09-21 DIAGNOSIS — E1122 Type 2 diabetes mellitus with diabetic chronic kidney disease: Secondary | ICD-10-CM | POA: Diagnosis not present

## 2018-09-21 DIAGNOSIS — D631 Anemia in chronic kidney disease: Secondary | ICD-10-CM | POA: Diagnosis not present

## 2018-09-21 DIAGNOSIS — I2510 Atherosclerotic heart disease of native coronary artery without angina pectoris: Secondary | ICD-10-CM | POA: Diagnosis not present

## 2018-09-21 DIAGNOSIS — I5042 Chronic combined systolic (congestive) and diastolic (congestive) heart failure: Secondary | ICD-10-CM | POA: Diagnosis not present

## 2018-09-24 DIAGNOSIS — I2510 Atherosclerotic heart disease of native coronary artery without angina pectoris: Secondary | ICD-10-CM | POA: Diagnosis not present

## 2018-09-24 DIAGNOSIS — N183 Chronic kidney disease, stage 3 (moderate): Secondary | ICD-10-CM | POA: Diagnosis not present

## 2018-09-24 DIAGNOSIS — I5042 Chronic combined systolic (congestive) and diastolic (congestive) heart failure: Secondary | ICD-10-CM | POA: Diagnosis not present

## 2018-09-24 DIAGNOSIS — E1122 Type 2 diabetes mellitus with diabetic chronic kidney disease: Secondary | ICD-10-CM | POA: Diagnosis not present

## 2018-09-24 DIAGNOSIS — D631 Anemia in chronic kidney disease: Secondary | ICD-10-CM | POA: Diagnosis not present

## 2018-09-24 DIAGNOSIS — I130 Hypertensive heart and chronic kidney disease with heart failure and stage 1 through stage 4 chronic kidney disease, or unspecified chronic kidney disease: Secondary | ICD-10-CM | POA: Diagnosis not present

## 2018-09-26 DIAGNOSIS — N183 Chronic kidney disease, stage 3 (moderate): Secondary | ICD-10-CM | POA: Diagnosis not present

## 2018-09-26 DIAGNOSIS — I130 Hypertensive heart and chronic kidney disease with heart failure and stage 1 through stage 4 chronic kidney disease, or unspecified chronic kidney disease: Secondary | ICD-10-CM | POA: Diagnosis not present

## 2018-09-26 DIAGNOSIS — D631 Anemia in chronic kidney disease: Secondary | ICD-10-CM | POA: Diagnosis not present

## 2018-09-26 DIAGNOSIS — I2510 Atherosclerotic heart disease of native coronary artery without angina pectoris: Secondary | ICD-10-CM | POA: Diagnosis not present

## 2018-09-26 DIAGNOSIS — I5042 Chronic combined systolic (congestive) and diastolic (congestive) heart failure: Secondary | ICD-10-CM | POA: Diagnosis not present

## 2018-09-26 DIAGNOSIS — E1122 Type 2 diabetes mellitus with diabetic chronic kidney disease: Secondary | ICD-10-CM | POA: Diagnosis not present

## 2018-10-25 DIAGNOSIS — E1122 Type 2 diabetes mellitus with diabetic chronic kidney disease: Secondary | ICD-10-CM | POA: Diagnosis not present

## 2018-10-25 DIAGNOSIS — I4891 Unspecified atrial fibrillation: Secondary | ICD-10-CM | POA: Diagnosis not present

## 2018-10-25 DIAGNOSIS — I700 Atherosclerosis of aorta: Secondary | ICD-10-CM | POA: Diagnosis not present

## 2018-10-25 DIAGNOSIS — I10 Essential (primary) hypertension: Secondary | ICD-10-CM | POA: Diagnosis not present

## 2018-10-25 DIAGNOSIS — E1165 Type 2 diabetes mellitus with hyperglycemia: Secondary | ICD-10-CM | POA: Diagnosis not present

## 2018-10-25 DIAGNOSIS — R269 Unspecified abnormalities of gait and mobility: Secondary | ICD-10-CM | POA: Diagnosis not present

## 2018-10-25 DIAGNOSIS — I5030 Unspecified diastolic (congestive) heart failure: Secondary | ICD-10-CM | POA: Diagnosis not present

## 2018-10-25 DIAGNOSIS — E11319 Type 2 diabetes mellitus with unspecified diabetic retinopathy without macular edema: Secondary | ICD-10-CM | POA: Diagnosis not present

## 2018-10-25 DIAGNOSIS — K5190 Ulcerative colitis, unspecified, without complications: Secondary | ICD-10-CM | POA: Diagnosis not present

## 2018-10-25 DIAGNOSIS — N183 Chronic kidney disease, stage 3 (moderate): Secondary | ICD-10-CM | POA: Diagnosis not present

## 2018-10-25 DIAGNOSIS — Z433 Encounter for attention to colostomy: Secondary | ICD-10-CM | POA: Diagnosis not present

## 2018-10-25 DIAGNOSIS — Z1389 Encounter for screening for other disorder: Secondary | ICD-10-CM | POA: Diagnosis not present

## 2018-10-31 ENCOUNTER — Ambulatory Visit: Payer: Medicare Other | Admitting: Gastroenterology

## 2018-10-31 NOTE — Progress Notes (Deleted)
Referring Provider: [name], MD Primary Care Physician:  [name], MD  Chief Complaint: Gastric ulcer  IMPRESSION:  Gastric ulcer on EGD 07/28/2018    - H pylori negative    - biopsies showed: gastric oxyntic mucosa with parietal cell hyperplasia, mild chronic gastritis    - intrinsic factor negative, methylmalonic acid 387 B12 deficiency    - methylmalonic acid elevated at 387, homocysteine elevated 20.6 Recent bleeding at the ostomy    - treated with silver nitrate 06/2018 Family history of colon cancer (mother at age 15 and sister at age 9)  Clinically improved. Not interested in repeat EGD recommended during his recent hospitalization to follow-up on GU.   Recent labs confirm B12 deficiency. Will start B12 replacement.   PLAN: Fasting gastrin level (patient would like to have it done during his appointment Dr. Lysle Rubens tomorrow) Hemoglobin check with labs tomorrow [id number] mcg daily Consider repeat EGD in the future Return in 4-5 months, return earlier if needed Continue pantoprazole once daily indefiniately RTC in 3-4 months   HPI: [name] [name] is a 85 y.o. male returns in scheduled follow-up. His sister accompanies him to this appointment. The interval history is obtained through the patient, review of his electronic health record, and his sister.  He is currently living in a nursing facility.  The time of his last visit he was started on vitamin B12 replacement.  A fasting gastrin level was ordered but the patient requested having it done during his appointment with Dr. Deforest Hoyles.  Those results are not yet available to me.  He was asked to continue on pantoprazole once daily indefinitely.    No more abdominal pain. No further bleeding except for one episode in the ostomy bag. He is not interested in trying alternative appliances because he appreciates the bigger, tinted bag. Bowel habits are at baseline.  There is no dysphagia, odynophagia, regurgitation,   heartburn, nausea, abdominal pain, change in bowel habits, melena, hematochezia, or bright red blood per rectum. There is no anorexia or recent change in weight.    Labs 07/28/18 show a hemoglobin 8.8. Additional labs show methylmalonic acid elevated at 387, homocysteine elevated 20.6  Past Medical History:  Diagnosis Date  . Allergic reaction caused by a drug 11/2017  . Anemia   . Atrial flutter ([place])    a. s/p RFCA of counterclockwise cavo-tricuspid isthmus dependent atrial flutter 2009 by Dr. Rayann Heman.  Marland Kitchen CAD (coronary artery disease) 06/2014   a. 06/2014: abnl nuc. Cath: Totally occluded LAD with faint collaterals (not a candidate for CTO PCI), 90% ramus s/p DES, 50-70% OM2.  . Chronic diastolic CHF (congestive heart failure) ([place])    a. 2D Echo 10/23/13: EF 50-55%, basal mid-inf HK, grade 2 DD, mild MR, mod dilated LA, no sig change from prior.  . CKD (chronic kidney disease), stage III ([place])   . Colostomy in place [facility])   . Complication of anesthesia    " during my kidney stone surgery my heart went out of rhythm  . Compression fracture of L1 lumbar vertebra ([name])   . DCM (dilated cardiomyopathy) ([place]) 02/27/2018   EF 35-40% by TEE 02/2018 felt tachycardia mediated  . Diabetes ([place])    TYPE 2   . Dyslipidemia   . Dyspnea   . Frequent PVCs   . GERD (gastroesophageal reflux disease)   . History of blood transfusion   . History of kidney stones   . History of peptic ulcer disease   . HTN (  hypertension)   . Hx of acute renal failure 01/02/10-3/24-11   due to hypovolemic shock, gastroenteritis and dehydration,hospitalized . Did requre a few days of dialysis. Cr at discharge was 1.8  . Kidney disease   . Kidney stone may 2009   Right hydronephrosis, S/P stone removal  . Lower back pain   . Macular degeneration   . OA (osteoarthritis) of hip   . Obesity   . Osteopenia   . PAF (paroxysmal atrial fibrillation) ([name])    not on long term anticoagulation due to history of heme positive  stools and anemia  . Shingles    episode  . Small bowel obstruction, partial ([place]) 2009   Episode  . Ulcerative colitis ([place])    a. s/p total colectomy remotely.    Past Surgical History:  Procedure Laterality Date  . BACK SURGERY    . BIOPSY  07/28/2018   Procedure: BIOPSY;  Surgeon: Thornton Park, MD;  Location: [place];  Service: Gastroenterology;;  . CARDIAC CATHETERIZATION  06/19/2014  . CARDIOVERSION N/A 02/23/2018   Procedure: CARDIOVERSION;  Surgeon: Dorothy Spark, MD;  Location: [facility] ENDOSCOPY;  Service: Cardiovascular;  Laterality: N/A;  . COLON RESECTION    . COLOSTOMY    . CORONARY ANGIOPLASTY    . CORONARY STENT PLACEMENT  06/19/2014   DES       dr Martinique  . ESOPHAGOGASTRODUODENOSCOPY (EGD) WITH PROPOFOL N/A 07/28/2018   Procedure: ESOPHAGOGASTRODUODENOSCOPY (EGD) WITH PROPOFOL;  Surgeon: Thornton Park, MD;  Location: [place];  Service: Gastroenterology;  Laterality: N/A;  . HARDWARE REMOVAL Left 12/01/2017   Procedure: HARDWARE REMOVAL LEFT ANKLE;  Surgeon: Newt Minion, MD;  Location: [place];  Service: Orthopedics;  Laterality: Left;  . HERNIA REPAIR    . LEFT AND RIGHT HEART CATHETERIZATION WITH CORONARY ANGIOGRAM N/A 06/19/2014   Procedure: LEFT AND RIGHT HEART CATHETERIZATION WITH CORONARY ANGIOGRAM;  Surgeon: Peter M Martinique, MD;  Location: [facility] CATH LAB;  Service: Cardiovascular;  Laterality: N/A;  . ORIF ANKLE FRACTURE Left 10/25/2017   Procedure: OPEN REDUCTION INTERNAL FIXATION (ORIF) LEFT ANKLE FRACTURE;  Surgeon: Newt Minion, MD;  Location: [place];  Service: Orthopedics;  Laterality: Left;  . TEE WITHOUT CARDIOVERSION N/A 02/23/2018   Procedure: TRANSESOPHAGEAL ECHOCARDIOGRAM (TEE);  Surgeon: Dorothy Spark, MD;  Location: [facility] ENDOSCOPY;  Service: Cardiovascular;  Laterality: N/A;  . TONSILLECTOMY      Prior to Admission medications   Medication Sig Start Date End Date Taking? Authorizing Provider  acetaminophen (TYLENOL) 325 MG tablet  Take 2 tablets (650 mg total) by mouth every 6 (six) hours as needed for mild pain, moderate pain or headache. 03/29/18   Hongalgi, Lenis Dickinson, MD  Amino Acids-Protein Hydrolys (FEEDING SUPPLEMENT, PRO-STAT SUGAR FREE 64,) LIQD Take 30 mLs by mouth daily.    [provider]  amiodarone (PACERONE) 200 MG tablet Take 2 tablets (400 mg total) by mouth as directed. BEGINNING ON 05/13/18 DECREASE TO 200 MG DAILY Patient taking differently: Take 200 mg by mouth daily.  04/24/18   [name] T, PA-C  apixaban (ELIQUIS) 2.5 MG TABS tablet Take 1 tablet (2.5 mg total) by mouth 2 (two) times daily. 07/29/18   Ghimire, Henreitta Leber, MD  atorvastatin (LIPITOR) 40 MG tablet Take 1 tablet (40 mg total) by mouth daily at 6 PM. 03/29/18   Hongalgi, Lenis Dickinson, MD  calcium carbonate (OS-CAL) 600 MG TABS tablet Take 600 mg by mouth daily with breakfast.    [provider]  Dermatological  Products, Misc. (MOISTURE BARRIER) OINT Apply 1 application topically 3 (three) times daily. To buttocks/sacrum    [provider]  docusate sodium (COLACE) 100 MG capsule Take 100 mg by mouth 2 (two) times daily.    [provider]  furosemide (LASIX) 40 MG tablet Take 1.5 tablets (60 mg total) by mouth 2 (two) times daily. 07/17/18 10/15/18  Lyda Jester M, PA-C  glimepiride (AMARYL) 1 MG tablet Take 1 mg by mouth 2 (two) times daily with a meal.     [provider]  Multiple Vitamin (MULTIVITAMIN WITH MINERALS) TABS tablet Take 1 tablet by mouth daily.    [provider]  pantoprazole (PROTONIX) 40 MG tablet Take 1 tablet (40 mg total) by mouth 2 (two) times daily. 07/28/18   Ghimire, Henreitta Leber, MD  saxagliptin HCl (ONGLYZA) 2.5 MG TABS tablet Take 2.5 mg by mouth daily.    [provider]    Current Outpatient Medications  Medication Sig Dispense Refill  . acetaminophen (TYLENOL) 325 MG tablet Take 2 tablets (650 mg total) by mouth every 6 (six) hours as needed for mild  pain, moderate pain or headache.    . Amino Acids-Protein Hydrolys (FEEDING SUPPLEMENT, PRO-STAT SUGAR FREE 64,) LIQD Take 30 mLs by mouth 3 (three) times daily with meals.     Marland Kitchen amiodarone (PACERONE) 200 MG tablet Take 200 mg by mouth daily.    Marland Kitchen apixaban (ELIQUIS) 2.5 MG TABS tablet Take 1 tablet (2.5 mg total) by mouth 2 (two) times daily. 60 tablet   . atorvastatin (LIPITOR) 40 MG tablet Take 1 tablet (40 mg total) by mouth daily at 6 PM. 30 tablet 0  . calcium carbonate (OS-CAL) 600 MG TABS tablet Take 600 mg by mouth daily with breakfast.    . Dermatological Products, Misc. (MOISTURE BARRIER) OINT Apply 1 application topically 3 (three) times daily. To buttocks/sacrum    . docusate sodium (COLACE) 100 MG capsule Take 100 mg by mouth 2 (two) times daily.    . furosemide (LASIX) 40 MG tablet Take 1.5 tablets (60 mg total) by mouth 2 (two) times daily. 90 tablet 3  . glimepiride (AMARYL) 1 MG tablet Take 1 mg by mouth 2 (two) times daily with a meal.     . Multiple Vitamin (MULTIVITAMIN WITH MINERALS) TABS tablet Take 1 tablet by mouth daily.    . pantoprazole (PROTONIX) 40 MG tablet Take 1 tablet (40 mg total) by mouth 2 (two) times daily. 60 tablet 0  . saxagliptin HCl (ONGLYZA) 2.5 MG TABS tablet Take 2.5 mg by mouth daily.    . vitamin B-12 (CYANOCOBALAMIN) 1000 MCG tablet Take 1 tablet (1,000 mcg total) by mouth daily. 30 tablet 3   No current facility-administered medications for this visit.     Allergies as of 10/31/2018 - Review Complete 08/13/2018  Allergen Reaction Noted  . Brilinta [ticagrelor] Shortness Of Breath and Other (See Comments) 11/05/2014  . Clindamycin/lincomycin Anaphylaxis and Other (See Comments) 12/11/2017  . Doxycycline Anaphylaxis 12/05/2017  . Penicillins Rash and Other (See Comments) 10/02/2013  . Broccoli [brassica oleracea italica]  98/92/1194  . Sulfa antibiotics Other (See Comments) 10/02/2013  . Tape Rash 07/26/2018    Family History  Problem  Relation Age of Onset  . Colon cancer Mother     Social History   Socioeconomic History  . Marital status: Widowed    Spouse name: Not on file  . Number of children: Not on file  . Years of education: Not on  file  . Highest education level: Not on file  Occupational History  . Not on file  Social Needs  . Financial resource strain: Not on file  . Food insecurity:    Worry: Not on file    Inability: Not on file  . Transportation needs:    Medical: Not on file    Non-medical: Not on file  Tobacco Use  . Smoking status: Never Smoker  . Smokeless tobacco: Never Used  Substance and Sexual Activity  . Alcohol use: No  . Drug use: No  . Sexual activity: Not on file  Lifestyle  . Physical activity:    Days per week: Not on file    Minutes per session: Not on file  . Stress: Not on file  Relationships  . Social connections:    Talks on phone: Not on file    Gets together: Not on file    Attends religious service: Not on file    Active member of club or organization: Not on file    Attends meetings of clubs or organizations: Not on file    Relationship status: Not on file  . Intimate partner violence:    Fear of current or ex partner: Not on file    Emotionally abused: Not on file    Physically abused: Not on file    Forced sexual activity: Not on file  Other Topics Concern  . Not on file  Social History Narrative  . Not on file    Review of Systems: 12 system ROS is negative except as noted above.   Physical Exam: Vital signs were reviewed. General:   Alert, well-nourished, pleasant and cooperative in NAD. Appears his stated age.  Head:  Normocephalic and atraumatic. Eyes:  Sclera clear, no icterus.   Conjunctiva pink. Mouth:  No deformity or lesions.   Neck:  Supple; no thyromegaly. Lungs:  Clear throughout to auscultation.   No wheezes.  Heart:  Regular rate and rhythm; no murmurs Abdomen:  Soft, nontender, normal bowel sounds. Ostomy present. No rebound or  guarding. No hepatosplenomegaly Rectal:  Deferred  Msk:  Symmetrical without gross deformities. Extremities:  No gross deformities or edema. Neurologic:  Alert and  oriented x4;  grossly nonfocal Skin:  No rash or bruise. Psych:  Alert and cooperative. Normal mood and affect.   Graceanna Theissen L. Tarri Glenn Md, MPH [facility] 10/31/2018, 8:58 AM

## 2018-11-04 IMAGING — CR DG CHEST 2V
2 series · 2 of 2 positions shown · non-contrast
Comparison: February 19, 2018

CLINICAL DATA: Preoperative chest x-ray

EXAM:
CHEST - 2 VIEW

[chest lat]
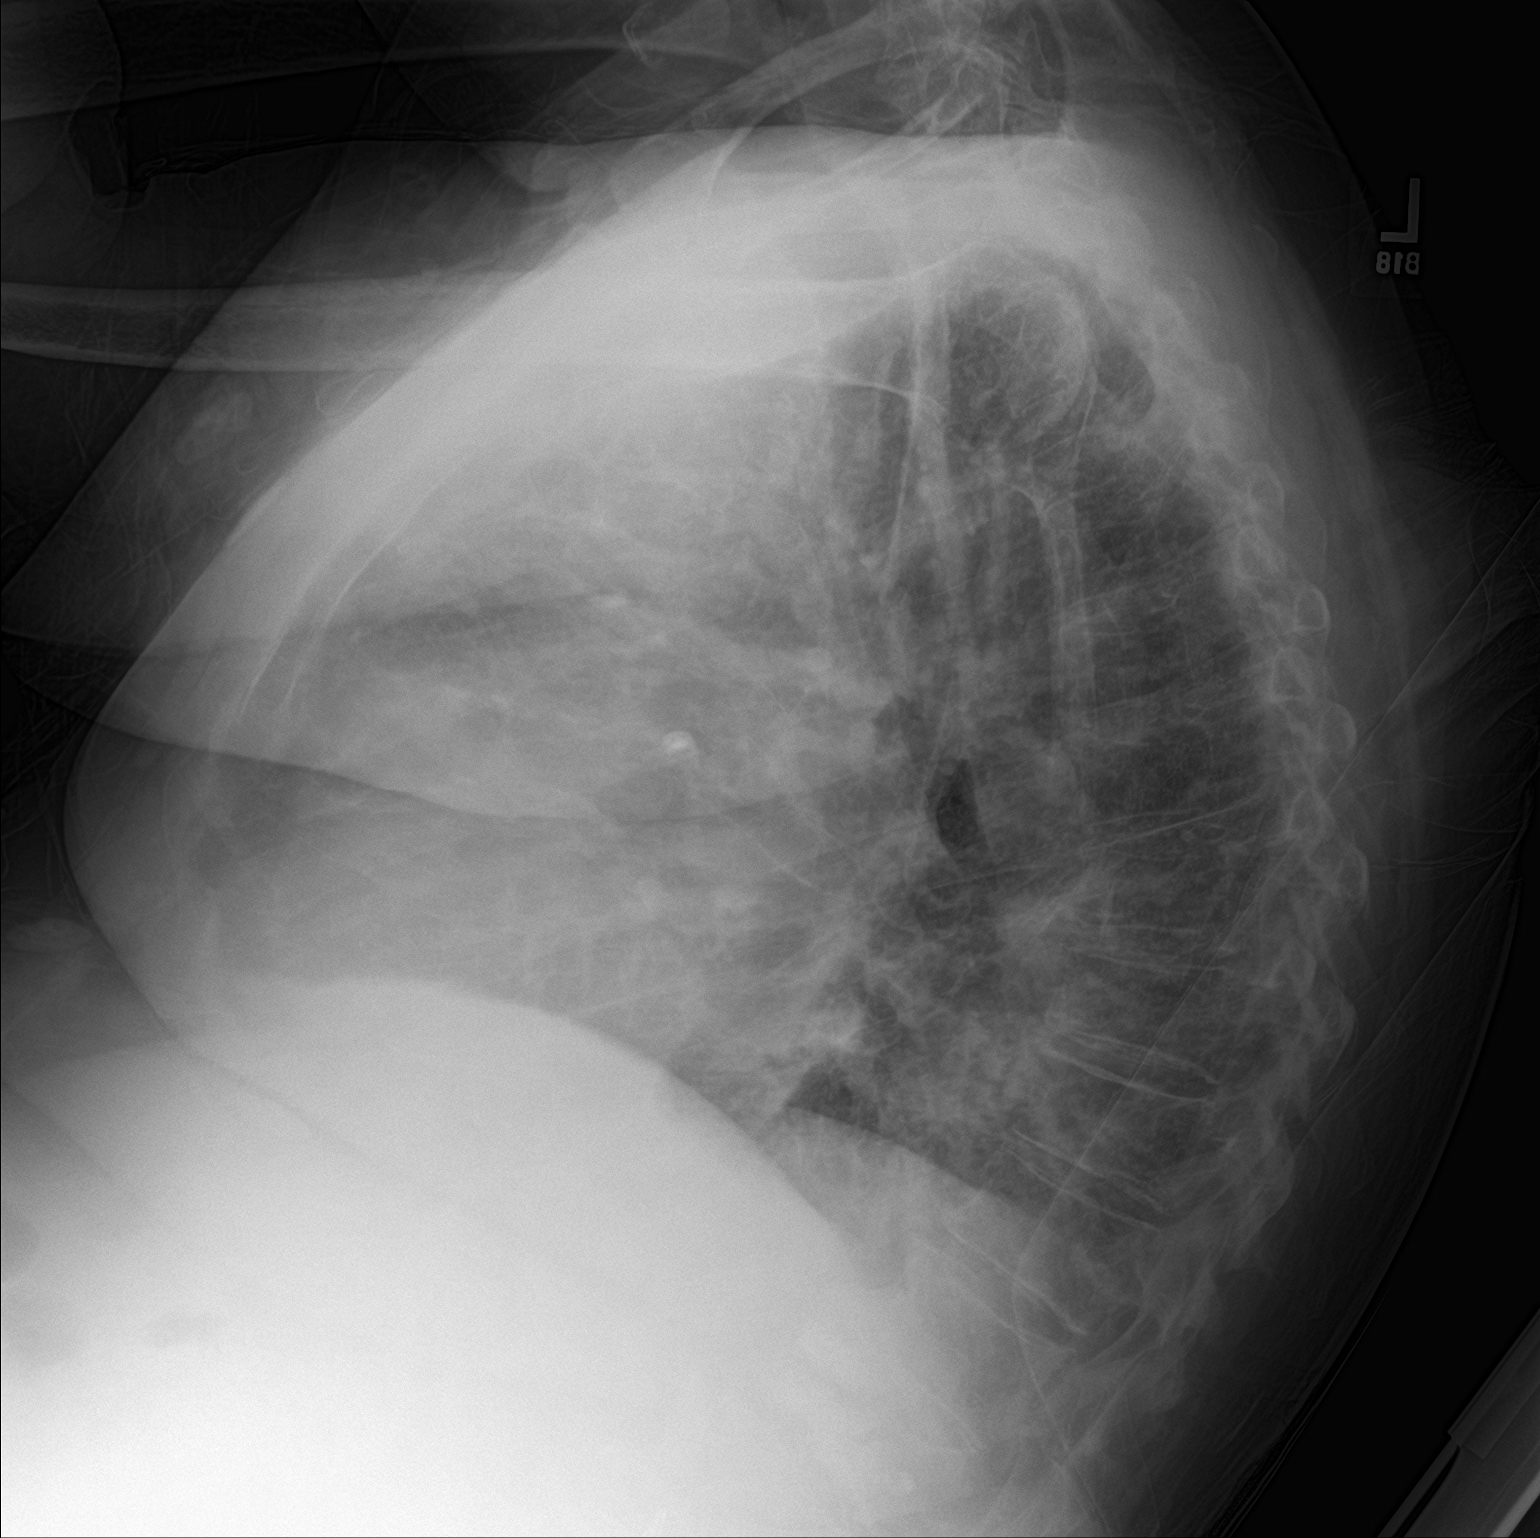

[chest ap]
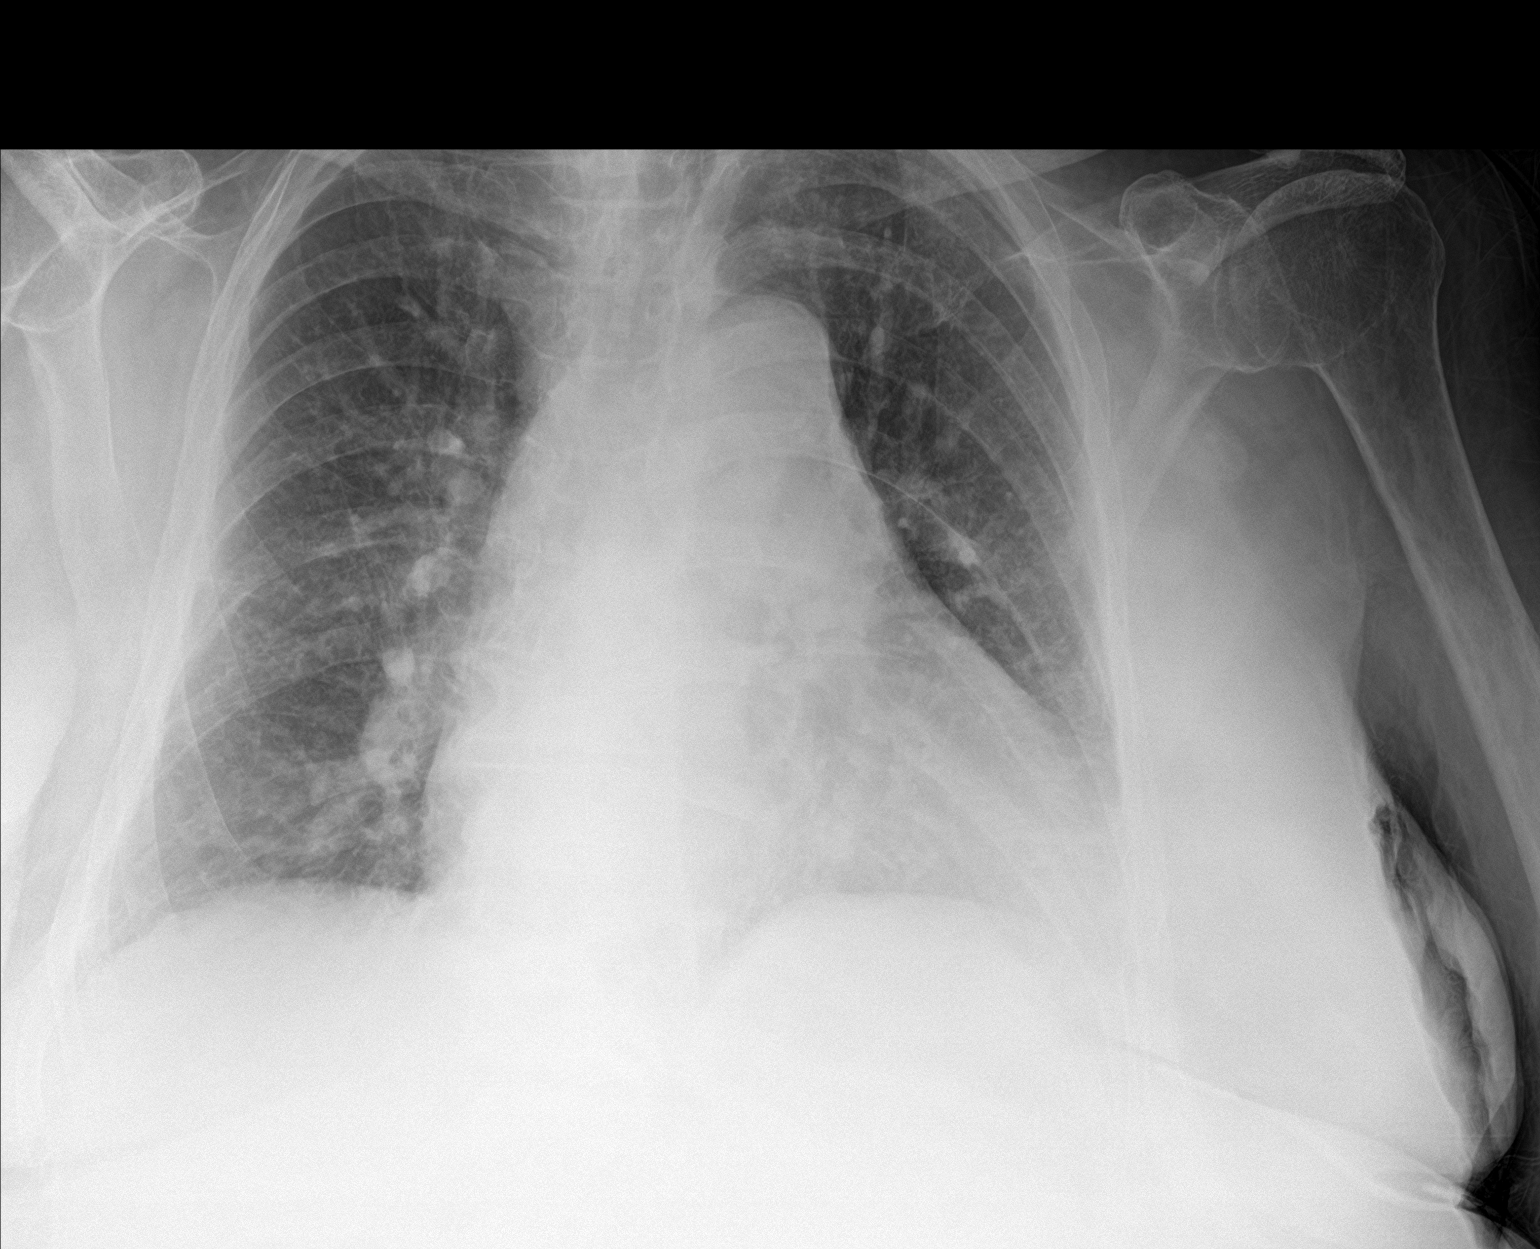

[2 of 2 positions shown; findings below may reference images not displayed]

FINDINGS: Stable cardiomegaly. Mild atelectasis in the left base. The hila,
mediastinum, lungs, and pleura are otherwise normal. A compression
fracture of a lower thoracic vertebral body is stable.
IMPRESSION: No acute abnormality.

## 2018-11-08 ENCOUNTER — Encounter: Payer: Self-pay | Admitting: Cardiology

## 2018-11-08 ENCOUNTER — Ambulatory Visit (INDEPENDENT_AMBULATORY_CARE_PROVIDER_SITE_OTHER): Payer: Medicare Other | Admitting: Cardiology

## 2018-11-08 VITALS — BP 122/68 | HR 83 | Ht 70.0 in | Wt 232.0 lb

## 2018-11-08 DIAGNOSIS — E785 Hyperlipidemia, unspecified: Secondary | ICD-10-CM | POA: Diagnosis not present

## 2018-11-08 DIAGNOSIS — I2510 Atherosclerotic heart disease of native coronary artery without angina pectoris: Secondary | ICD-10-CM

## 2018-11-08 DIAGNOSIS — I10 Essential (primary) hypertension: Secondary | ICD-10-CM

## 2018-11-08 DIAGNOSIS — I5042 Chronic combined systolic (congestive) and diastolic (congestive) heart failure: Secondary | ICD-10-CM

## 2018-11-08 DIAGNOSIS — I420 Dilated cardiomyopathy: Secondary | ICD-10-CM | POA: Diagnosis not present

## 2018-11-08 DIAGNOSIS — I4819 Other persistent atrial fibrillation: Secondary | ICD-10-CM

## 2018-11-08 NOTE — Progress Notes (Signed)
Cardiology Office Note:    Date:  11/08/2018   ID:  [name] [name], DOB April 02, 1933, MRN [id number]  PCP:  [name], MD  Cardiologist:  [name], MD    Referring MD: [name], MD   Chief Complaint  Patient presents with  . Coronary Artery Disease  . Atrial Fibrillation  . Congestive Heart Failure    History of Present Illness:    [name] [name] is a 85 y.o. male with a hx of CAD status post DES to the RI and known chronic occlusion of the LAD, PAF, atrial flutter status post ablation diastolic CHF, CKD, hypertension, DM.  Patient has previously not been anticoagulated due to a history of GI bleed.  02/2018 he was bradycardic with profound hyperkalemia.  Heart rate recovered with reversal of hyperkalemia but then he went into atrial fib with RVR while amiodarone and beta-blocker on hold.  He was placed on Eliquis and underwent TEE guided DCCV.  EF 35 to 40%.  Follow-up echo 02/2018 EF 40 to 45%.  CHA2DS2-VASc equals 6. last saw [name] 07/16/2018 and was in normal sinus rhythm.  Continued to have lower extremity edema.  No ACE inhibitor secondary to hyperkalemia.  He will not wear compression stockings.  CBC was checked and hemoglobin came back at 7.7.  It was found that he was on Eliquis 5 mg twice daily rather than 2.5 mg 2 times daily.  Last hemoglobin 8.8 and potassium 4.3 creatinine 1.88 07/28/2018.  Endoscopy showed nonbleeding gastric ulcer likely source of bleed.  Past Medical History:  Diagnosis Date  . Allergic reaction caused by a drug 11/2017  . Anemia   . Atrial flutter ([place])    a. s/p RFCA of counterclockwise cavo-tricuspid isthmus dependent atrial flutter 2009 by Dr. Rayann Heman.  Marland Kitchen CAD (coronary artery disease) 06/2014   a. 06/2014: abnl nuc. Cath: Totally occluded LAD with faint collaterals (not a candidate for CTO PCI), 90% ramus s/p DES, 50-70% OM2.  . Chronic diastolic CHF (congestive heart failure) ([place])    a. 2D Echo 10/23/13: EF 50-55%, basal mid-inf  HK, grade 2 DD, mild MR, mod dilated LA, no sig change from prior.  . CKD (chronic kidney disease), stage III ([place])   . Colostomy in place [facility])   . Complication of anesthesia    " during my kidney stone surgery my heart went out of rhythm  . Compression fracture of L1 lumbar vertebra ([name])   . DCM (dilated cardiomyopathy) ([place]) 02/27/2018   EF 35-40% by TEE 02/2018 felt tachycardia mediated  . Diabetes ([place])    TYPE 2   . Dyslipidemia   . Dyspnea   . Frequent PVCs   . GERD (gastroesophageal reflux disease)   . History of blood transfusion   . History of kidney stones   . History of peptic ulcer disease   . HTN (hypertension)   . Hx of acute renal failure 01/02/10-3/24-11   due to hypovolemic shock, gastroenteritis and dehydration,hospitalized . Did requre a few days of dialysis. Cr at discharge was 1.8  . Kidney disease   . Kidney stone may 2009   Right hydronephrosis, S/P stone removal  . Lower back pain   . Macular degeneration   . OA (osteoarthritis) of hip   . Obesity   . Osteopenia   . PAF (paroxysmal atrial fibrillation) ([name])    not on long term anticoagulation due to history of heme positive stools and anemia  . Shingles    episode  .  Small bowel obstruction, partial ([place]) 2009   Episode  . Ulcerative colitis ([place])    a. s/p total colectomy remotely.    Past Surgical History:  Procedure Laterality Date  . BACK SURGERY    . BIOPSY  07/28/2018   Procedure: BIOPSY;  Surgeon: Thornton Park, MD;  Location: [place];  Service: Gastroenterology;;  . CARDIAC CATHETERIZATION  06/19/2014  . CARDIOVERSION N/A 02/23/2018   Procedure: CARDIOVERSION;  Surgeon: Dorothy Spark, MD;  Location: [facility] ENDOSCOPY;  Service: Cardiovascular;  Laterality: N/A;  . COLON RESECTION    . COLOSTOMY    . CORONARY ANGIOPLASTY    . CORONARY STENT PLACEMENT  06/19/2014   DES       dr Martinique  . ESOPHAGOGASTRODUODENOSCOPY (EGD) WITH PROPOFOL N/A 07/28/2018   Procedure:  ESOPHAGOGASTRODUODENOSCOPY (EGD) WITH PROPOFOL;  Surgeon: Thornton Park, MD;  Location: [place];  Service: Gastroenterology;  Laterality: N/A;  . HARDWARE REMOVAL Left 12/01/2017   Procedure: HARDWARE REMOVAL LEFT ANKLE;  Surgeon: Newt Minion, MD;  Location: [place];  Service: Orthopedics;  Laterality: Left;  . HERNIA REPAIR    . LEFT AND RIGHT HEART CATHETERIZATION WITH CORONARY ANGIOGRAM N/A 06/19/2014   Procedure: LEFT AND RIGHT HEART CATHETERIZATION WITH CORONARY ANGIOGRAM;  Surgeon: Peter M Martinique, MD;  Location: [facility] CATH LAB;  Service: Cardiovascular;  Laterality: N/A;  . ORIF ANKLE FRACTURE Left 10/25/2017   Procedure: OPEN REDUCTION INTERNAL FIXATION (ORIF) LEFT ANKLE FRACTURE;  Surgeon: Newt Minion, MD;  Location: [place];  Service: Orthopedics;  Laterality: Left;  . TEE WITHOUT CARDIOVERSION N/A 02/23/2018   Procedure: TRANSESOPHAGEAL ECHOCARDIOGRAM (TEE);  Surgeon: Dorothy Spark, MD;  Location: [facility] ENDOSCOPY;  Service: Cardiovascular;  Laterality: N/A;  . TONSILLECTOMY      Current Medications: Current Meds  Medication Sig  . acetaminophen (TYLENOL) 325 MG tablet Take 2 tablets (650 mg total) by mouth every 6 (six) hours as needed for mild pain, moderate pain or headache.  Marland Kitchen amiodarone (PACERONE) 200 MG tablet Take 200 mg by mouth daily.  Marland Kitchen apixaban (ELIQUIS) 2.5 MG TABS tablet Take 1 tablet (2.5 mg total) by mouth 2 (two) times daily.  Marland Kitchen atorvastatin (LIPITOR) 40 MG tablet Take 1 tablet (40 mg total) by mouth daily at 6 PM.  . calcium carbonate (OS-CAL) 600 MG TABS tablet Take 600 mg by mouth daily with breakfast.  . Dermatological Products, Misc. (MOISTURE BARRIER) OINT Apply 1 application topically 3 (three) times daily. To buttocks/sacrum  . docusate sodium (COLACE) 100 MG capsule Take 100 mg by mouth 2 (two) times daily.  . furosemide (LASIX) 40 MG tablet Take 1.5 tablets (60 mg total) by mouth 2 (two) times daily.  Marland Kitchen glimepiride (AMARYL) 1 MG tablet Take 1 mg by mouth  2 (two) times daily with a meal.   . Multiple Vitamin (MULTIVITAMIN WITH MINERALS) TABS tablet Take 1 tablet by mouth daily.  Marland Kitchen NYAMYC powder   . pantoprazole (PROTONIX) 40 MG tablet Take 1 tablet (40 mg total) by mouth 2 (two) times daily.  . saxagliptin HCl (ONGLYZA) 2.5 MG TABS tablet Take 2.5 mg by mouth daily.  Nelva Nay SOLOSTAR 300 UNIT/ML SOPN   . vitamin B-12 (CYANOCOBALAMIN) 1000 MCG tablet Take 1 tablet (1,000 mcg total) by mouth daily.     Allergies:   Brilinta [ticagrelor]; Clindamycin/lincomycin; Doxycycline; Penicillins; Broccoli [brassica oleracea italica]; Sulfa antibiotics; and Tape   Social History   Socioeconomic History  . Marital status: Widowed    Spouse name: Not on  file  . Number of children: Not on file  . Years of education: Not on file  . Highest education level: Not on file  Occupational History  . Not on file  Social Needs  . Financial resource strain: Not on file  . Food insecurity:    Worry: Not on file    Inability: Not on file  . Transportation needs:    Medical: Not on file    Non-medical: Not on file  Tobacco Use  . Smoking status: Never Smoker  . Smokeless tobacco: Never Used  Substance and Sexual Activity  . Alcohol use: No  . Drug use: No  . Sexual activity: Not on file  Lifestyle  . Physical activity:    Days per week: Not on file    Minutes per session: Not on file  . Stress: Not on file  Relationships  . Social connections:    Talks on phone: Not on file    Gets together: Not on file    Attends religious service: Not on file    Active member of club or organization: Not on file    Attends meetings of clubs or organizations: Not on file    Relationship status: Not on file  Other Topics Concern  . Not on file  Social History Narrative  . Not on file     Family History: The patient's family history includes Colon cancer in his mother.  ROS:   Please see the history of present illness.    ROS  All other systems reviewed  and negative.   EKGs/Labs/Other Studies Reviewed:    The following studies were reviewed today: none  EKG:  EKG is not ordered today.    Recent Labs: 07/19/2018: NT-Pro BNP 1,351 07/26/2018: ALT 16; B Natriuretic Peptide 188.6; Magnesium 1.8; TSH 1.345 07/28/2018: BUN 32; Creatinine, Ser 1.88; Hemoglobin 8.8; Platelets 295; Potassium 4.3; Sodium 138   Recent Lipid Panel    Component Value Date/Time   CHOL 112 03/27/2018 0547   TRIG 119 03/27/2018 0547   HDL 36 (L) 03/27/2018 0547   CHOLHDL 3.1 03/27/2018 0547   VLDL 24 03/27/2018 0547   LDLCALC 52 03/27/2018 0547    Physical Exam:    VS:  BP 122/68   Pulse 83   Ht 5' 10"  (1.778 m)   Wt 232 lb (105.2 kg)   SpO2 97%   BMI 33.29 kg/m     Wt Readings from Last 3 Encounters:  11/08/18 232 lb (105.2 kg)  08/13/18 221 lb (100.2 kg)  08/08/18 226 lb 12.8 oz (102.9 kg)     GEN:  Well nourished, well developed in no acute distress HEENT: Normal NECK: No JVD; No carotid bruits LYMPHATICS: No lymphadenopathy CARDIAC: RRR, no murmurs, rubs, gallops RESPIRATORY:  Clear to auscultation without rales, wheezing or rhonchi  ABDOMEN: Soft, non-tender, non-distended MUSCULOSKELETAL: trace pedal edema; No deformity  SKIN: Warm and dry NEUROLOGIC:  Alert and oriented x 3 PSYCHIATRIC:  Normal affect   ASSESSMENT:    1. Coronary artery disease involving native coronary artery of native heart without angina pectoris   2. Persistent atrial fibrillation   3. Essential hypertension   4. DCM (dilated cardiomyopathy) ([place])   5. Chronic combined systolic and diastolic CHF (congestive heart failure) ([place])   6. Dyslipidemia    PLAN:    In order of problems listed above:  1.  ASCAD - status post DES to the RI with known occlusion of the LAD.  He does not have  any anginal symptoms.  He will continue on medical therapy with statin.  He is not on aspirin due to apixaban.  2.  Persistent atrial fibrillation -status post TEE guided DCCV  on 02/2018.  He remains in normal sinus rhythm on exam.  He will continue on amiodarone 200 mg daily and apixaban 2.5 mg twice daily dosed for age greater than 80 and creatinine greater than 1.5.  His TSH and ALT are up-to-date.  EKG 07/16/2018 showed QTC at 493 ms.  He has not had any further problems with bleeding since his nonbleeding gastric ulcer was noted on endoscopy in the setting of anemia from his facility giving him a higher dose of apixaban and was prescribed.    3.  HTN -BP is well controlled on exam today.  He is currently not on anti-antihypertensive medications.  4.  DCM -EF was 40 to 45% on echo May 2019.  5.  Chronic combined systolic/diastolic CHF - he actually appears euvolemic on exam today.  He hahas chronic lower extremity edema which is very stable.  His weight is up 6 pounds from September but his lungs are clear.  He has chronic shortness of breath which is very stable.  I am not going to change his diuretics and he will continue on Lasix 60 mg twice daily.  Creatinine was stable at 1.92 potassium 4.1 on 10/25/2018.   Medication Adjustments/Labs and Tests Ordered: Current medicines are reviewed at length with the patient today.  Concerns regarding medicines are outlined above.  No orders of the defined types were placed in this encounter.  No orders of the defined types were placed in this encounter.   Signed, [name], MD  11/08/2018 12:51 PM    [place]

## 2018-11-08 NOTE — Patient Instructions (Signed)
Medication Instructions:  Your physician recommends that you continue on your current medications as directed. Please refer to the Current Medication list given to you today.  If you need a refill on your cardiac medications before your next appointment, please call your pharmacy.   Lab work: None If you have labs (blood work) drawn today and your tests are completely normal, you will receive your results only by: Marland Kitchen MyChart Message (if you have MyChart) OR . A paper copy in the mail If you have any lab test that is abnormal or we need to change your treatment, we will call you to review the results.  Testing/Procedures: None  Follow-Up: At [facility], you and your health needs are our priority.  As part of our continuing mission to provide you with exceptional heart care, we have created designated Provider Care Teams.  These Care Teams include your primary Cardiologist (physician) and Advanced Practice Providers (APPs -  Physician Assistants and Nurse Practitioners) who all work together to provide you with the care you need, when you need it.  Your physician recommends that you schedule a follow-up appointment in: 6 months with the PA.    You will need a follow up appointment in 1 years.  Please call our office 2 months in advance to schedule this appointment.  You may see [name], MD or one of the following Advanced Practice Providers on your designated Care Team:   [place], PA-C [name], PA-C . [name], PA-C

## 2018-11-20 ENCOUNTER — Encounter: Payer: Self-pay | Admitting: Gastroenterology

## 2018-11-20 ENCOUNTER — Other Ambulatory Visit (INDEPENDENT_AMBULATORY_CARE_PROVIDER_SITE_OTHER): Payer: Medicare Other

## 2018-11-20 ENCOUNTER — Ambulatory Visit (INDEPENDENT_AMBULATORY_CARE_PROVIDER_SITE_OTHER): Payer: Medicare Other | Admitting: Gastroenterology

## 2018-11-20 VITALS — BP 110/60 | HR 84 | Ht 70.0 in | Wt 231.8 lb

## 2018-11-20 DIAGNOSIS — K253 Acute gastric ulcer without hemorrhage or perforation: Secondary | ICD-10-CM

## 2018-11-20 DIAGNOSIS — E538 Deficiency of other specified B group vitamins: Secondary | ICD-10-CM | POA: Diagnosis not present

## 2018-11-20 LAB — CBC WITH DIFFERENTIAL/PLATELET
Basophils Absolute: 0.1 10*3/uL (ref 0.0–0.1)
Basophils Relative: 1 % (ref 0.0–3.0)
Eosinophils Absolute: 0.3 10*3/uL (ref 0.0–0.7)
Eosinophils Relative: 2.9 % (ref 0.0–5.0)
HCT: 36.1 % — ABNORMAL LOW (ref 39.0–52.0)
Hemoglobin: 11.6 g/dL — ABNORMAL LOW (ref 13.0–17.0)
Lymphocytes Relative: 11.2 % — ABNORMAL LOW (ref 12.0–46.0)
Lymphs Abs: 1.3 10*3/uL (ref 0.7–4.0)
MCHC: 32.2 g/dL (ref 30.0–36.0)
MCV: 83.2 fl (ref 78.0–100.0)
Monocytes Absolute: 0.9 10*3/uL (ref 0.1–1.0)
Monocytes Relative: 8 % (ref 3.0–12.0)
Neutro Abs: 8.9 10*3/uL — ABNORMAL HIGH (ref 1.4–7.7)
Neutrophils Relative %: 76.9 % (ref 43.0–77.0)
Platelets: 306 10*3/uL (ref 150.0–400.0)
RBC: 4.34 Mil/uL (ref 4.22–5.81)
RDW: 16.6 % — ABNORMAL HIGH (ref 11.5–15.5)
WBC: 11.6 10*3/uL — ABNORMAL HIGH (ref 4.0–10.5)

## 2018-11-20 LAB — VITAMIN B12: Vitamin B-12: 1176 pg/mL — ABNORMAL HIGH (ref 211–911)

## 2018-11-20 NOTE — Progress Notes (Signed)
Referring Provider: [name], MD Primary Care Physician:  [name], MD  Chief Complaint: routine follow-up  IMPRESSION:  Gastric ulcer On EGD 07/28/2018    - H pylori negative    - biopsies showed: gastric oxyntic mucosa with parietal cell hyperplasia, mild chronic gastritis    - intrinsic factor negative, methylmalonic acid 387 B12 deficiency    - methylmalonic acid elevated at 387, homocysteine elevated 20.6 Recent bleeding at the ostomy    - treated with silver nitrate 06/2018 Family history of colon cancer (mother at age 57 and sister at age 25)  Will obtain fasting gastrin on PPI, realizing that there are limitations to its interpretation. Plan repeat EGD to document healing of gastric ulcer.   PLAN: Fasting gastrin level CBC B12 level VitB121000 mcg daily Repeat EGD at the hospital at this convenience Continue pantoprazole once daily indefiniately   HPI: [name] [name] is a 85 y.o. male returns in scheduled follow-up. His sister, Joycelyn Schmid, accompanies him to this appointment. The interval history is obtained through the patient, review of his electronic health record, and his sister.  Currently living at a nursing facility.  No more abdominal pain. No further bleeding except for intermittent bleeding around the ostomy when he changes his appliance. He is not interested in trying alternative appliances because he appreciates the bigger, tinted bag. Bowel habits are at baseline.  There is no dysphagia, odynophagia, regurgitation,  heartburn, nausea, abdominal pain, change in bowel habits, melena, hematochezia, or bright red blood per rectum. There is no anorexia or recent change in weight.    Last labs  From 07/28/18 show a hemoglobin 8.8. Additional labs show methylmalonic acid elevated at 387, homocysteine elevated 20.6. Labs from 08/10/18: intrinsic factor negative, homocysteine 20.6, MMA 387. He did not have the gastrin level drawn at his last visit as ordered.     Past Medical History:  Diagnosis Date  . Allergic reaction caused by a drug 11/2017  . Anemia   . Atrial flutter ([place])    a. s/p RFCA of counterclockwise cavo-tricuspid isthmus dependent atrial flutter 2009 by Dr. Rayann Heman.  Marland Kitchen CAD (coronary artery disease) 06/2014   a. 06/2014: abnl nuc. Cath: Totally occluded LAD with faint collaterals (not a candidate for CTO PCI), 90% ramus s/p DES, 50-70% OM2.  . Chronic diastolic CHF (congestive heart failure) ([place])    a. 2D Echo 10/23/13: EF 50-55%, basal mid-inf HK, grade 2 DD, mild MR, mod dilated LA, no sig change from prior.  . CKD (chronic kidney disease), stage III ([place])   . Colostomy in place [facility])   . Complication of anesthesia    " during my kidney stone surgery my heart went out of rhythm  . Compression fracture of L1 lumbar vertebra ([name])   . DCM (dilated cardiomyopathy) ([place]) 02/27/2018   EF 35-40% by TEE 02/2018 felt tachycardia mediated  . Diabetes ([place])    TYPE 2   . Dyslipidemia   . Dyspnea   . Frequent PVCs   . Gastric ulcer   . GERD (gastroesophageal reflux disease)   . History of blood transfusion   . History of kidney stones   . History of peptic ulcer disease   . HTN (hypertension)   . Hx of acute renal failure 01/02/10-3/24-11   due to hypovolemic shock, gastroenteritis and dehydration,hospitalized . Did requre a few days of dialysis. Cr at discharge was 1.8  . Kidney disease   . Kidney stone may 2009   Right hydronephrosis, S/P  stone removal  . Lower back pain   . Macular degeneration   . OA (osteoarthritis) of hip   . Obesity   . Osteopenia   . PAF (paroxysmal atrial fibrillation) ([name])    not on long term anticoagulation due to history of heme positive stools and anemia  . Shingles    episode  . Small bowel obstruction, partial ([place]) 2009   Episode  . Ulcerative colitis ([place])    a. s/p total colectomy remotely.    Past Surgical History:  Procedure Laterality Date  . BACK SURGERY    . BIOPSY  07/28/2018    Procedure: BIOPSY;  Surgeon: Thornton Park, MD;  Location: [place];  Service: Gastroenterology;;  . CARDIAC CATHETERIZATION  06/19/2014  . CARDIOVERSION N/A 02/23/2018   Procedure: CARDIOVERSION;  Surgeon: Dorothy Spark, MD;  [facility] ENDOSCOPY;  Service: Cardiovascular;  Laterality: N/A;  . COLON RESECTION    . COLOSTOMY    . CORONARY ANGIOPLASTY    . CORONARY STENT PLACEMENT  06/19/2014   DES       dr Martinique  . ESOPHAGOGASTRODUODENOSCOPY (EGD) WITH PROPOFOL N/A 07/28/2018   Procedure: ESOPHAGOGASTRODUODENOSCOPY (EGD) WITH PROPOFOL;  Surgeon: Thornton Park, MD;  Location: [place];  Service: Gastroenterology;  Laterality: N/A;  . HARDWARE REMOVAL Left 12/01/2017   Procedure: HARDWARE REMOVAL LEFT ANKLE;  Surgeon: Newt Minion, MD;  Location: [place];  Service: Orthopedics;  Laterality: Left;  . HERNIA REPAIR    . LEFT AND RIGHT HEART CATHETERIZATION WITH CORONARY ANGIOGRAM N/A 06/19/2014   Procedure: LEFT AND RIGHT HEART CATHETERIZATION WITH CORONARY ANGIOGRAM;  Surgeon: Peter M Martinique, MD;  Location: [facility] CATH LAB;  Service: Cardiovascular;  Laterality: N/A;  . ORIF ANKLE FRACTURE Left 10/25/2017   Procedure: OPEN REDUCTION INTERNAL FIXATION (ORIF) LEFT ANKLE FRACTURE;  Surgeon: Newt Minion, MD;  Location: [place];  Service: Orthopedics;  Laterality: Left;  . TEE WITHOUT CARDIOVERSION N/A 02/23/2018   Procedure: TRANSESOPHAGEAL ECHOCARDIOGRAM (TEE);  Surgeon: Dorothy Spark, MD;  Location: [facility] ENDOSCOPY;  Service: Cardiovascular;  Laterality: N/A;  . TONSILLECTOMY      Prior to Admission medications   Medication Sig Start Date End Date Taking? Authorizing Provider  acetaminophen (TYLENOL) 325 MG tablet Take 2 tablets (650 mg total) by mouth every 6 (six) hours as needed for mild pain, moderate pain or headache. 03/29/18   Hongalgi, Lenis Dickinson, MD  Amino Acids-Protein Hydrolys (FEEDING SUPPLEMENT, PRO-STAT SUGAR FREE 64,) LIQD Take 30 mLs by mouth daily.    [provider]  amiodarone (PACERONE) 200 MG tablet Take 2 tablets (400 mg total) by mouth as directed. BEGINNING ON 05/13/18 DECREASE TO 200 MG DAILY Patient taking differently: Take 200 mg by mouth daily.  04/24/18   [name] T, PA-C  apixaban (ELIQUIS) 2.5 MG TABS tablet Take 1 tablet (2.5 mg total) by mouth 2 (two) times daily. 07/29/18   Ghimire, Henreitta Leber, MD  atorvastatin (LIPITOR) 40 MG tablet Take 1 tablet (40 mg total) by mouth daily at 6 PM. 03/29/18   Hongalgi, Lenis Dickinson, MD  calcium carbonate (OS-CAL) 600 MG TABS tablet Take 600 mg by mouth daily with breakfast.    [provider]  Dermatological Products, Misc. (MOISTURE BARRIER) OINT Apply 1 application topically 3 (three) times daily. To buttocks/sacrum    [provider]  docusate sodium (COLACE) 100 MG capsule Take 100 mg by mouth 2 (two) times daily.    [provider]  furosemide (LASIX) 40 MG  tablet Take 1.5 tablets (60 mg total) by mouth 2 (two) times daily. 07/17/18 10/15/18  Lyda Jester M, PA-C  glimepiride (AMARYL) 1 MG tablet Take 1 mg by mouth 2 (two) times daily with a meal.     [provider]  Multiple Vitamin (MULTIVITAMIN WITH MINERALS) TABS tablet Take 1 tablet by mouth daily.    [provider]  pantoprazole (PROTONIX) 40 MG tablet Take 1 tablet (40 mg total) by mouth 2 (two) times daily. 07/28/18   Ghimire, Henreitta Leber, MD  saxagliptin HCl (ONGLYZA) 2.5 MG TABS tablet Take 2.5 mg by mouth daily.    [provider]    Current Outpatient Medications  Medication Sig Dispense Refill  . acetaminophen (TYLENOL) 325 MG tablet Take 2 tablets (650 mg total) by mouth every 6 (six) hours as needed for mild pain, moderate pain or headache.    Marland Kitchen amiodarone (PACERONE) 200 MG tablet Take 200 mg by mouth daily.    Marland Kitchen apixaban (ELIQUIS) 2.5 MG TABS tablet Take 1 tablet (2.5 mg total) by mouth 2 (two) times daily. 60 tablet   . atorvastatin (LIPITOR) 40 MG tablet Take 1  tablet (40 mg total) by mouth daily at 6 PM. 30 tablet 0  . calcium carbonate (OS-CAL) 600 MG TABS tablet Take 600 mg by mouth daily with breakfast.    . Dermatological Products, Misc. (MOISTURE BARRIER) OINT Apply 1 application topically 3 (three) times daily. To buttocks/sacrum    . docusate sodium (COLACE) 100 MG capsule Take 100 mg by mouth 2 (two) times daily.    . furosemide (LASIX) 40 MG tablet Take 1.5 tablets (60 mg total) by mouth 2 (two) times daily. 90 tablet 3  . glimepiride (AMARYL) 1 MG tablet Take 1 mg by mouth 2 (two) times daily with a meal.     . Multiple Vitamin (MULTIVITAMIN WITH MINERALS) TABS tablet Take 1 tablet by mouth daily.    Marland Kitchen NYAMYC powder     . pantoprazole (PROTONIX) 40 MG tablet Take 1 tablet (40 mg total) by mouth 2 (two) times daily. 60 tablet 0  . saxagliptin HCl (ONGLYZA) 2.5 MG TABS tablet Take 2.5 mg by mouth daily.    Nelva Nay SOLOSTAR 300 UNIT/ML SOPN Inject 10 Units into the skin daily.     . vitamin B-12 (CYANOCOBALAMIN) 1000 MCG tablet Take 1 tablet (1,000 mcg total) by mouth daily. 30 tablet 3   No current facility-administered medications for this visit.     Allergies as of 11/20/2018 - Review Complete 11/20/2018  Allergen Reaction Noted  . Brilinta [ticagrelor] Shortness Of Breath and Other (See Comments) 11/05/2014  . Clindamycin/lincomycin Anaphylaxis and Other (See Comments) 12/11/2017  . Doxycycline Anaphylaxis 12/05/2017  . Penicillins Rash and Other (See Comments) 10/02/2013  . Broccoli [brassica oleracea italica]  01/75/1025  . Sulfa antibiotics Other (See Comments) 10/02/2013  . Tape Rash 07/26/2018    Family History  Problem Relation Age of Onset  . Colon cancer Mother   . Esophageal cancer Neg Hx   . Stomach cancer Neg Hx   . Pancreatic cancer Neg Hx   . Liver disease Neg Hx     Social History   Socioeconomic History  . Marital status: Widowed    Spouse name: Not on file  . Number of children: Not on file  . Years of  education: Not on file  . Highest education level: Not on file  Occupational History  . Not on file  Social Needs  .  Financial resource strain: Not on file  . Food insecurity:    Worry: Not on file    Inability: Not on file  . Transportation needs:    Medical: Not on file    Non-medical: Not on file  Tobacco Use  . Smoking status: Never Smoker  . Smokeless tobacco: Never Used  Substance and Sexual Activity  . Alcohol use: No  . Drug use: No  . Sexual activity: Not on file  Lifestyle  . Physical activity:    Days per week: Not on file    Minutes per session: Not on file  . Stress: Not on file  Relationships  . Social connections:    Talks on phone: Not on file    Gets together: Not on file    Attends religious service: Not on file    Active member of club or organization: Not on file    Attends meetings of clubs or organizations: Not on file    Relationship status: Not on file  . Intimate partner violence:    Fear of current or ex partner: Not on file    Emotionally abused: Not on file    Physically abused: Not on file    Forced sexual activity: Not on file  Other Topics Concern  . Not on file  Social History Narrative  . Not on file    Physical Exam: Vital signs were reviewed. General:   Alert, well-nourished, pleasant and cooperative in NAD. Appears his stated age. Examined in a wheelchair.  Head:  Normocephalic and atraumatic. Eyes:  Sclera clear, no icterus.   Conjunctiva pink. Mouth:  No deformity or lesions.   Neck:  Supple; no thyromegaly. Lungs:  Clear throughout to auscultation.   No wheezes. Heart:  Regular rate and rhythm; no murmurs Abdomen:  Soft, nontender, normal bowel sounds. Ostomy present. No rebound or guarding. No hepatosplenomegaly Neurologic:  Alert and  oriented x4;  grossly nonfocal Skin:  No rash or bruise. Psych:  Alert and cooperative. Normal mood and affect.   Alessandria Henken L. Tarri Glenn Md, MPH [facility] 12/13/2018, 8:53  PM

## 2018-11-22 DIAGNOSIS — E1340 Other specified diabetes mellitus with diabetic neuropathy, unspecified: Secondary | ICD-10-CM | POA: Diagnosis not present

## 2018-11-22 DIAGNOSIS — Z7984 Long term (current) use of oral hypoglycemic drugs: Secondary | ICD-10-CM | POA: Diagnosis not present

## 2018-11-22 DIAGNOSIS — R269 Unspecified abnormalities of gait and mobility: Secondary | ICD-10-CM | POA: Diagnosis not present

## 2018-11-22 DIAGNOSIS — I872 Venous insufficiency (chronic) (peripheral): Secondary | ICD-10-CM | POA: Diagnosis not present

## 2018-11-22 DIAGNOSIS — B372 Candidiasis of skin and nail: Secondary | ICD-10-CM | POA: Diagnosis not present

## 2018-11-23 LAB — GASTRIN: Gastrin: 2369 pg/mL — ABNORMAL HIGH (ref <=100)

## 2018-12-13 ENCOUNTER — Encounter: Payer: Self-pay | Admitting: Gastroenterology

## 2019-03-14 DIAGNOSIS — E1122 Type 2 diabetes mellitus with diabetic chronic kidney disease: Secondary | ICD-10-CM | POA: Diagnosis not present

## 2019-03-14 DIAGNOSIS — Z794 Long term (current) use of insulin: Secondary | ICD-10-CM | POA: Diagnosis not present

## 2019-03-28 DIAGNOSIS — Z20828 Contact with and (suspected) exposure to other viral communicable diseases: Secondary | ICD-10-CM | POA: Diagnosis not present

## 2019-04-02 DIAGNOSIS — Z794 Long term (current) use of insulin: Secondary | ICD-10-CM | POA: Diagnosis not present

## 2019-04-02 DIAGNOSIS — I5030 Unspecified diastolic (congestive) heart failure: Secondary | ICD-10-CM | POA: Diagnosis not present

## 2019-04-02 DIAGNOSIS — K5190 Ulcerative colitis, unspecified, without complications: Secondary | ICD-10-CM | POA: Diagnosis not present

## 2019-04-02 DIAGNOSIS — I700 Atherosclerosis of aorta: Secondary | ICD-10-CM | POA: Diagnosis not present

## 2019-04-02 DIAGNOSIS — I4891 Unspecified atrial fibrillation: Secondary | ICD-10-CM | POA: Diagnosis not present

## 2019-04-02 DIAGNOSIS — I872 Venous insufficiency (chronic) (peripheral): Secondary | ICD-10-CM | POA: Diagnosis not present

## 2019-04-02 DIAGNOSIS — E782 Mixed hyperlipidemia: Secondary | ICD-10-CM | POA: Diagnosis not present

## 2019-04-02 DIAGNOSIS — E1122 Type 2 diabetes mellitus with diabetic chronic kidney disease: Secondary | ICD-10-CM | POA: Diagnosis not present

## 2019-04-02 DIAGNOSIS — N183 Chronic kidney disease, stage 3 (moderate): Secondary | ICD-10-CM | POA: Diagnosis not present

## 2019-04-02 DIAGNOSIS — I10 Essential (primary) hypertension: Secondary | ICD-10-CM | POA: Diagnosis not present

## 2019-04-02 DIAGNOSIS — M8580 Other specified disorders of bone density and structure, unspecified site: Secondary | ICD-10-CM | POA: Diagnosis not present

## 2019-06-10 DIAGNOSIS — I480 Paroxysmal atrial fibrillation: Secondary | ICD-10-CM | POA: Insufficient documentation

## 2019-06-10 NOTE — Progress Notes (Signed)
Cardiology Office Note    Date:  06/11/2019   ID:  [name] [name], DOB [birth year], MRN [id number]  PCP:  [name], MD  Cardiologist: [name], MD EPS: None  No chief complaint on file.   History of Present Illness:  [name] [name] is a 85 y.o. male with a hx of CAD status post DES to the RI and known chronic occlusion of the LAD 2015, PAF previously not anticoagulated because of GI bleed, atrial flutter status post ablation, chronic diastolic CHF, CKD, hypertension, DM.   02/2018 he was bradycardic felt secondary to hyperkalemia.  Heart rate recovered with reversal of this but then he went into A. fib with RVR while amiodarone and beta-blockers were on hold.  He was placed on Eliquis and underwent TEE guided DCCV.  EF was 35 to 40%.  Follow-up echo 02/2018 EF 40 to 45%.  CHA2DS2-VASc equals 6.  Patient had drop in his hemoglobin to 7.7 when he was taking Eliquis 5 mg twice daily rather than 2.5 mg twice daily.  Endo showed non-bleeding gastric ulcer likely source of bleed.  No ACE inhibitor because of hyperkalemia.  Patient refuses compression stockings.  Patient last saw Dr. Radford Pax 11/08/2018 at which time he was stable.  Patient comes in from [place] and says it's been tough since covid19. Says they aren't giving him his meds on time. Sunday evening they forgot to give his meds and he called and they never gave it according to the patient. They marked that they gave it to him but he's very insistent that they didn't.He says half the staff isn't wearing masks at work. He has complained to the higher up administrators.  Occassional shortness of breath but denies chest pain, palpitations, dizziness or presyncope.    Past Medical History:  Diagnosis Date  . Allergic reaction caused by a drug 11/2017  . Anemia   . Atrial flutter (No Name)    a. s/p RFCA of counterclockwise cavo-tricuspid isthmus dependent atrial flutter 2009 by Dr. Rayann Heman.  Marland Kitchen CAD (coronary artery disease) 06/2014   a.  06/2014: abnl nuc. Cath: Totally occluded LAD with faint collaterals (not a candidate for CTO PCI), 90% ramus s/p DES, 50-70% OM2.  . Chronic diastolic CHF (congestive heart failure) ([place])    a. 2D Echo 10/23/13: EF 50-55%, basal mid-inf HK, grade 2 DD, mild MR, mod dilated LA, no sig change from prior.  . CKD (chronic kidney disease), stage III ([place])   . Colostomy in place [facility])   . Complication of anesthesia    " during my kidney stone surgery my heart went out of rhythm  . Compression fracture of L1 lumbar vertebra ([name])   . DCM (dilated cardiomyopathy) ([place]) 02/27/2018   EF 35-40% by TEE 02/2018 felt tachycardia mediated  . Diabetes ([place])    TYPE 2   . Dyslipidemia   . Dyspnea   . Frequent PVCs   . Gastric ulcer   . GERD (gastroesophageal reflux disease)   . History of blood transfusion   . History of kidney stones   . History of peptic ulcer disease   . HTN (hypertension)   . Hx of acute renal failure 01/02/10-3/24-11   due to hypovolemic shock, gastroenteritis and dehydration,hospitalized . Did requre a few days of dialysis. Cr at discharge was 1.8  . Kidney disease   . Kidney stone may 2009   Right hydronephrosis, S/P stone removal  . Lower back pain   . Macular degeneration   .  OA (osteoarthritis) of hip   . Obesity   . Osteopenia   . PAF (paroxysmal atrial fibrillation) ([name])    not on long term anticoagulation due to history of heme positive stools and anemia  . Shingles    episode  . Small bowel obstruction, partial ([place]) 2009   Episode  . Ulcerative colitis ([place])    a. s/p total colectomy remotely.    Past Surgical History:  Procedure Laterality Date  . BACK SURGERY    . BIOPSY  07/28/2018   Procedure: BIOPSY;  Surgeon: Thornton Park, MD;  Location: [place];  Service: Gastroenterology;;  . CARDIAC CATHETERIZATION  06/19/2014  . CARDIOVERSION N/A 02/23/2018   Procedure: CARDIOVERSION;  Surgeon: Dorothy Spark, MD;  Location: [facility] ENDOSCOPY;  Service:  Cardiovascular;  Laterality: N/A;  . COLON RESECTION    . COLOSTOMY    . CORONARY ANGIOPLASTY    . CORONARY STENT PLACEMENT  06/19/2014   DES       dr Martinique  . ESOPHAGOGASTRODUODENOSCOPY (EGD) WITH PROPOFOL N/A 07/28/2018   Procedure: ESOPHAGOGASTRODUODENOSCOPY (EGD) WITH PROPOFOL;  Surgeon: Thornton Park, MD;  Location: [place];  Service: Gastroenterology;  Laterality: N/A;  . HARDWARE REMOVAL Left 12/01/2017   Procedure: HARDWARE REMOVAL LEFT ANKLE;  Surgeon: Newt Minion, MD;  Location: [place];  Service: Orthopedics;  Laterality: Left;  . HERNIA REPAIR    . LEFT AND RIGHT HEART CATHETERIZATION WITH CORONARY ANGIOGRAM N/A 06/19/2014   Procedure: LEFT AND RIGHT HEART CATHETERIZATION WITH CORONARY ANGIOGRAM;  Surgeon: Peter M Martinique, MD;  Location: [facility] CATH LAB;  Service: Cardiovascular;  Laterality: N/A;  . ORIF ANKLE FRACTURE Left 10/25/2017   Procedure: OPEN REDUCTION INTERNAL FIXATION (ORIF) LEFT ANKLE FRACTURE;  Surgeon: Newt Minion, MD;  Location: [place];  Service: Orthopedics;  Laterality: Left;  . TEE WITHOUT CARDIOVERSION N/A 02/23/2018   Procedure: TRANSESOPHAGEAL ECHOCARDIOGRAM (TEE);  Surgeon: Dorothy Spark, MD;  Location: [facility] ENDOSCOPY;  Service: Cardiovascular;  Laterality: N/A;  . TONSILLECTOMY      Current Medications: Current Meds  Medication Sig  . acetaminophen (TYLENOL) 325 MG tablet Take 2 tablets (650 mg total) by mouth every 6 (six) hours as needed for mild pain, moderate pain or headache.  Marland Kitchen amiodarone (PACERONE) 200 MG tablet Take 200 mg by mouth daily.  Marland Kitchen apixaban (ELIQUIS) 2.5 MG TABS tablet Take 1 tablet (2.5 mg total) by mouth 2 (two) times daily.  Marland Kitchen atorvastatin (LIPITOR) 40 MG tablet Take 1 tablet (40 mg total) by mouth daily at 6 PM.  . calcium carbonate (OS-CAL) 600 MG TABS tablet Take 600 mg by mouth daily with breakfast.  . Dermatological Products, Misc. (MOISTURE BARRIER) OINT Apply 1 application topically 3 (three) times daily. To  buttocks/sacrum  . docusate sodium (COLACE) 100 MG capsule Take 100 mg by mouth 2 (two) times daily.  . furosemide (LASIX) 40 MG tablet Take 1.5 tablets (60 mg total) by mouth 2 (two) times daily.  Marland Kitchen glimepiride (AMARYL) 1 MG tablet Take 1 mg by mouth 2 (two) times daily with a meal.   . Multiple Vitamin (MULTIVITAMIN WITH MINERALS) TABS tablet Take 1 tablet by mouth daily.  Marland Kitchen NYAMYC powder   . pantoprazole (PROTONIX) 40 MG tablet Take 1 tablet (40 mg total) by mouth 2 (two) times daily.  . saxagliptin HCl (ONGLYZA) 2.5 MG TABS tablet Take 2.5 mg by mouth daily.  Nelva Nay SOLOSTAR 300 UNIT/ML SOPN Inject 10 Units into the skin daily.   . vitamin B-12 (  CYANOCOBALAMIN) 1000 MCG tablet Take 1 tablet (1,000 mcg total) by mouth daily.     Allergies:   Brilinta [ticagrelor], Clindamycin/lincomycin, Doxycycline, Penicillins, Broccoli [brassica oleracea italica], Sulfa antibiotics, and Tape   Social History   Socioeconomic History  . Marital status: Widowed    Spouse name: Not on file  . Number of children: Not on file  . Years of education: Not on file  . Highest education level: Not on file  Occupational History  . Not on file  Social Needs  . Financial resource strain: Not on file  . Food insecurity    Worry: Not on file    Inability: Not on file  . Transportation needs    Medical: Not on file    Non-medical: Not on file  Tobacco Use  . Smoking status: Never Smoker  . Smokeless tobacco: Never Used  Substance and Sexual Activity  . Alcohol use: No  . Drug use: No  . Sexual activity: Not on file  Lifestyle  . Physical activity    Days per week: Not on file    Minutes per session: Not on file  . Stress: Not on file  Relationships  . Social Herbalist on phone: Not on file    Gets together: Not on file    Attends religious service: Not on file    Active member of club or organization: Not on file    Attends meetings of clubs or organizations: Not on file     Relationship status: Not on file  Other Topics Concern  . Not on file  Social History Narrative  . Not on file     Family History:  The patient's family history includes Colon cancer in his mother.   ROS:   Please see the history of present illness.    ROS All other systems reviewed and are negative.   PHYSICAL EXAM:   VS:  BP 114/60   Pulse 91   Ht 5' 10"  (1.778 m)   Wt 236 lb (107 kg)   SpO2 99%   BMI 33.86 kg/m   Physical Exam  GEN: Obese, in no acute distress  Neck: no JVD, carotid bruits, or masses Cardiac:RRR; no murmurs, rubs, or gallops  Respiratory:  clear to auscultation bilaterally, normal work of breathing GI: soft, nontender, nondistended, + BS Ext: plus 1 bilateral leg edema.without cyanosis, clubbing,   decreased distal pulses bilaterally Neuro:  Alert and Oriented x 3 Psych: euthymic mood, full affect  Wt Readings from Last 3 Encounters:  06/11/19 236 lb (107 kg)  11/20/18 231 lb 12.8 oz (105.1 kg)  11/08/18 232 lb (105.2 kg)      Studies/Labs Reviewed:   EKG:  EKG is  ordered today.  The ekg ordered today demonstrates normal sinus rhythm with LVH and left anterior fascicular block unchanged from 06/11/2019  Recent Labs: 07/19/2018: NT-Pro BNP 1,351 07/26/2018: ALT 16; B Natriuretic Peptide 188.6; Magnesium 1.8; TSH 1.345 07/28/2018: BUN 32; Creatinine, Ser 1.88; Potassium 4.3; Sodium 138 11/20/2018: Hemoglobin 11.6; Platelets 306.0   Lipid Panel    Component Value Date/Time   CHOL 112 03/27/2018 0547   TRIG 119 03/27/2018 0547   HDL 36 (L) 03/27/2018 0547   CHOLHDL 3.1 03/27/2018 0547   VLDL 24 03/27/2018 0547   LDLCALC 52 03/27/2018 0547    Additional studies/ records that were reviewed today include:  2D echo 5/29/2019Study Conclusions   - Left ventricle: The cavity size was normal. There was mild  concentric hypertrophy. Systolic function was mildly to   moderately reduced. The estimated ejection fraction was in the   range of 40% to  45%. Diffuse hypokinesis. Doppler parameters are   consistent with abnormal left ventricular relaxation (grade 1   diastolic dysfunction). Doppler parameters are consistent with   indeterminate ventricular filling pressure. - Aortic valve: Transvalvular velocity was within the normal range.   There was no stenosis. There was trivial regurgitation.   Regurgitation pressure half-time: 793 ms. - Mitral valve: Transvalvular velocity was within the normal range.   There was no evidence for stenosis. - Left atrium: The atrium was severely dilated. - Right ventricle: The cavity size was normal. Wall thickness was   normal. Systolic function was normal. - Right atrium: The atrium was moderately dilated. - Tricuspid valve: There was mild regurgitation. - Pulmonary arteries: Systolic pressure was mildly increased. PA   peak pressure: 44 mm Hg (S). - Global longitudinal strain -12.5% (abnormal).   Catheterization 2015 Final Conclusions:   1. 2 vessel obstructive CAD. Chronic total occlusion of the LAD with minimal collaterals. Severe disease in a large ramus intermediate. 2. Good LV function 3. Normal right heart and LV filling pressures. 4. Successful stenting of the ramus intermediate with DES.   Recommendations: DAPT for one year. Treat other residual disease medically. He is not a candidate for CTO PCI. Anticipate DC in am.     [name], [place] 06/19/2014, 3:32 PM            Electronically signed by Martinique, Peter M, MD at 06/19/2014  3:49 PM     ASSESSMENT:    1. Coronary artery disease involving native coronary artery of native heart without angina pectoris   2. Paroxysmal atrial fibrillation ([name])   3. DCM (dilated cardiomyopathy) ([place])   4. Chronic combined systolic and diastolic CHF (congestive heart failure) ([place])   5. Essential hypertension      PLAN:  In order of problems listed above:  CAD status post DES to the RA with known occlusion of the LAD-no angina   Persistent atrial fibrillation's test post TEE guided DCCV 02/2018 on amiodarone and apixaban 2.5 mg twice daily because of age and creatinine greater than 1.5.-having blood work by [employer]. Will ask them to check CBC and CMET TSH and send to [place]. Fu with Dr. Radford Pax in 6 months.  Dilated cardiomyopathy ejection fraction 40 to 45% on echo 02/2018  Chronic combined systolic and diastolic CHF-chronic lower extremity edema-compensated  Essential hypertension BP controlled  Medication Adjustments/Labs and Tests Ordered: Current medicines are reviewed at length with the patient today.  Concerns regarding medicines are outlined above.  Medication changes, Labs and Tests ordered today are listed in the Patient Instructions below. Patient Instructions  Medication Instructions:  Your physician recommends that you continue on your current medications as directed. Please refer to the Current Medication list given to you today.  If you need a refill on your cardiac medications before your next appointment, please call your pharmacy.   Lab work: None ordered  If you have labs (blood work) drawn today and your tests are completely normal, you will receive your results only by: Marland Kitchen MyChart Message (if you have MyChart) OR . A paper copy in the mail If you have any lab test that is abnormal or we need to change your treatment, we will call you to review the results.  Testing/Procedures: None ordered  Follow-Up: At [facility], you and your health needs are our  priority.  As part of our continuing mission to provide you with exceptional heart care, we have created designated Provider Care Teams.  These Care Teams include your primary Cardiologist (physician) and Advanced Practice Providers (APPs -  Physician Assistants and Nurse Practitioners) who all work together to provide you with the care you need, when you need it. You will need a follow up appointment in 6 months.  Please call our office 2  months in advance to schedule this appointment.  You may see [name], MD or one of the following Advanced Practice Providers on your designated Care Team:   [place], PA-C [name], PA-C . [name], PA-C  Any Other Special Instructions Will Be Listed Below (If Applicable).       [name] Boast, PA-C  06/11/2019 12:57 PM    [facility] [place], [place], [place]  [address] Phone: [phone]; Fax: [phone]

## 2019-06-11 ENCOUNTER — Other Ambulatory Visit: Payer: Self-pay

## 2019-06-11 ENCOUNTER — Ambulatory Visit (INDEPENDENT_AMBULATORY_CARE_PROVIDER_SITE_OTHER): Payer: Medicare Other | Admitting: Physician Assistant

## 2019-06-11 ENCOUNTER — Encounter: Payer: Self-pay | Admitting: Physician Assistant

## 2019-06-11 VITALS — BP 114/60 | HR 91 | Ht 70.0 in | Wt 236.0 lb

## 2019-06-11 DIAGNOSIS — I2510 Atherosclerotic heart disease of native coronary artery without angina pectoris: Secondary | ICD-10-CM | POA: Diagnosis not present

## 2019-06-11 DIAGNOSIS — I5042 Chronic combined systolic (congestive) and diastolic (congestive) heart failure: Secondary | ICD-10-CM | POA: Diagnosis not present

## 2019-06-11 DIAGNOSIS — I420 Dilated cardiomyopathy: Secondary | ICD-10-CM | POA: Diagnosis not present

## 2019-06-11 DIAGNOSIS — I480 Paroxysmal atrial fibrillation: Secondary | ICD-10-CM | POA: Diagnosis not present

## 2019-06-11 DIAGNOSIS — I10 Essential (primary) hypertension: Secondary | ICD-10-CM | POA: Diagnosis not present

## 2019-06-11 NOTE — Patient Instructions (Signed)
Medication Instructions:  Your physician recommends that you continue on your current medications as directed. Please refer to the Current Medication list given to you today.  If you need a refill on your cardiac medications before your next appointment, please call your pharmacy.   Lab work: None ordered  If you have labs (blood work) drawn today and your tests are completely normal, you will receive your results only by: Marland Kitchen MyChart Message (if you have MyChart) OR . A paper copy in the mail If you have any lab test that is abnormal or we need to change your treatment, we will call you to review the results.  Testing/Procedures: None ordered  Follow-Up: At [name], you and your health needs are our priority.  As part of our continuing mission to provide you with exceptional heart care, we have created designated Provider Care Teams.  These Care Teams include your primary Cardiologist (physician) and Advanced Practice Providers (APPs -  Physician Assistants and Nurse Practitioners) who all work together to provide you with the care you need, when you need it. You will need a follow up appointment in 6 months.  Please call our office 2 months in advance to schedule this appointment.  You may see [name], MD or one of the following Advanced Practice Providers on your designated Care Team:   [place], PA-C [name], PA-C . [name], PA-C  Any Other Special Instructions Will Be Listed Below (If Applicable).

## 2019-07-29 DIAGNOSIS — I5030 Unspecified diastolic (congestive) heart failure: Secondary | ICD-10-CM | POA: Diagnosis not present

## 2019-07-29 DIAGNOSIS — Z1389 Encounter for screening for other disorder: Secondary | ICD-10-CM | POA: Diagnosis not present

## 2019-07-29 DIAGNOSIS — I700 Atherosclerosis of aorta: Secondary | ICD-10-CM | POA: Diagnosis not present

## 2019-07-29 DIAGNOSIS — I10 Essential (primary) hypertension: Secondary | ICD-10-CM | POA: Diagnosis not present

## 2019-07-29 DIAGNOSIS — Z0000 Encounter for general adult medical examination without abnormal findings: Secondary | ICD-10-CM | POA: Diagnosis not present

## 2019-07-29 DIAGNOSIS — I2510 Atherosclerotic heart disease of native coronary artery without angina pectoris: Secondary | ICD-10-CM | POA: Diagnosis not present

## 2019-07-29 DIAGNOSIS — I872 Venous insufficiency (chronic) (peripheral): Secondary | ICD-10-CM | POA: Diagnosis not present

## 2019-07-29 DIAGNOSIS — E1122 Type 2 diabetes mellitus with diabetic chronic kidney disease: Secondary | ICD-10-CM | POA: Diagnosis not present

## 2019-07-29 DIAGNOSIS — I4891 Unspecified atrial fibrillation: Secondary | ICD-10-CM | POA: Diagnosis not present

## 2019-07-29 DIAGNOSIS — Z23 Encounter for immunization: Secondary | ICD-10-CM | POA: Diagnosis not present

## 2019-07-29 DIAGNOSIS — K219 Gastro-esophageal reflux disease without esophagitis: Secondary | ICD-10-CM | POA: Diagnosis not present

## 2019-07-29 DIAGNOSIS — E782 Mixed hyperlipidemia: Secondary | ICD-10-CM | POA: Diagnosis not present

## 2019-07-29 DIAGNOSIS — K5190 Ulcerative colitis, unspecified, without complications: Secondary | ICD-10-CM | POA: Diagnosis not present

## 2019-07-29 DIAGNOSIS — N1831 Chronic kidney disease, stage 3a: Secondary | ICD-10-CM | POA: Diagnosis not present

## 2019-07-29 DIAGNOSIS — D649 Anemia, unspecified: Secondary | ICD-10-CM | POA: Diagnosis not present

## 2019-08-09 ENCOUNTER — Other Ambulatory Visit: Payer: Self-pay | Admitting: Gastroenterology

## 2019-08-09 DIAGNOSIS — Z800 Family history of malignant neoplasm of digestive organs: Secondary | ICD-10-CM

## 2019-08-09 DIAGNOSIS — K253 Acute gastric ulcer without hemorrhage or perforation: Secondary | ICD-10-CM

## 2019-09-05 DIAGNOSIS — H35043 Retinal micro-aneurysms, unspecified, bilateral: Secondary | ICD-10-CM | POA: Diagnosis not present

## 2019-09-05 DIAGNOSIS — H353132 Nonexudative age-related macular degeneration, bilateral, intermediate dry stage: Secondary | ICD-10-CM | POA: Diagnosis not present

## 2019-09-05 DIAGNOSIS — H43813 Vitreous degeneration, bilateral: Secondary | ICD-10-CM | POA: Diagnosis not present

## 2019-09-05 DIAGNOSIS — H26491 Other secondary cataract, right eye: Secondary | ICD-10-CM | POA: Diagnosis not present

## 2019-09-16 DIAGNOSIS — R262 Difficulty in walking, not elsewhere classified: Secondary | ICD-10-CM | POA: Diagnosis not present

## 2019-09-16 DIAGNOSIS — R278 Other lack of coordination: Secondary | ICD-10-CM | POA: Diagnosis not present

## 2019-09-16 DIAGNOSIS — M6281 Muscle weakness (generalized): Secondary | ICD-10-CM | POA: Diagnosis not present

## 2019-09-19 DIAGNOSIS — Z20828 Contact with and (suspected) exposure to other viral communicable diseases: Secondary | ICD-10-CM | POA: Diagnosis not present

## 2019-09-19 DIAGNOSIS — R262 Difficulty in walking, not elsewhere classified: Secondary | ICD-10-CM | POA: Diagnosis not present

## 2019-09-19 DIAGNOSIS — M6281 Muscle weakness (generalized): Secondary | ICD-10-CM | POA: Diagnosis not present

## 2019-09-19 DIAGNOSIS — R278 Other lack of coordination: Secondary | ICD-10-CM | POA: Diagnosis not present

## 2019-09-20 DIAGNOSIS — R278 Other lack of coordination: Secondary | ICD-10-CM | POA: Diagnosis not present

## 2019-09-20 DIAGNOSIS — M6281 Muscle weakness (generalized): Secondary | ICD-10-CM | POA: Diagnosis not present

## 2019-09-20 DIAGNOSIS — R262 Difficulty in walking, not elsewhere classified: Secondary | ICD-10-CM | POA: Diagnosis not present

## 2019-09-24 DIAGNOSIS — Z20828 Contact with and (suspected) exposure to other viral communicable diseases: Secondary | ICD-10-CM | POA: Diagnosis not present

## 2019-09-24 DIAGNOSIS — M6281 Muscle weakness (generalized): Secondary | ICD-10-CM | POA: Diagnosis not present

## 2019-09-24 DIAGNOSIS — R262 Difficulty in walking, not elsewhere classified: Secondary | ICD-10-CM | POA: Diagnosis not present

## 2019-09-24 DIAGNOSIS — R278 Other lack of coordination: Secondary | ICD-10-CM | POA: Diagnosis not present

## 2019-09-25 DIAGNOSIS — R262 Difficulty in walking, not elsewhere classified: Secondary | ICD-10-CM | POA: Diagnosis not present

## 2019-09-25 DIAGNOSIS — R278 Other lack of coordination: Secondary | ICD-10-CM | POA: Diagnosis not present

## 2019-09-25 DIAGNOSIS — M6281 Muscle weakness (generalized): Secondary | ICD-10-CM | POA: Diagnosis not present

## 2019-09-27 DIAGNOSIS — R278 Other lack of coordination: Secondary | ICD-10-CM | POA: Diagnosis not present

## 2019-09-27 DIAGNOSIS — M6281 Muscle weakness (generalized): Secondary | ICD-10-CM | POA: Diagnosis not present

## 2019-09-27 DIAGNOSIS — R262 Difficulty in walking, not elsewhere classified: Secondary | ICD-10-CM | POA: Diagnosis not present

## 2019-09-30 DIAGNOSIS — M6281 Muscle weakness (generalized): Secondary | ICD-10-CM | POA: Diagnosis not present

## 2019-09-30 DIAGNOSIS — R262 Difficulty in walking, not elsewhere classified: Secondary | ICD-10-CM | POA: Diagnosis not present

## 2019-09-30 DIAGNOSIS — R278 Other lack of coordination: Secondary | ICD-10-CM | POA: Diagnosis not present

## 2019-10-01 DIAGNOSIS — R278 Other lack of coordination: Secondary | ICD-10-CM | POA: Diagnosis not present

## 2019-10-01 DIAGNOSIS — R262 Difficulty in walking, not elsewhere classified: Secondary | ICD-10-CM | POA: Diagnosis not present

## 2019-10-01 DIAGNOSIS — M6281 Muscle weakness (generalized): Secondary | ICD-10-CM | POA: Diagnosis not present

## 2019-10-03 DIAGNOSIS — R262 Difficulty in walking, not elsewhere classified: Secondary | ICD-10-CM | POA: Diagnosis not present

## 2019-10-03 DIAGNOSIS — R278 Other lack of coordination: Secondary | ICD-10-CM | POA: Diagnosis not present

## 2019-10-03 DIAGNOSIS — M6281 Muscle weakness (generalized): Secondary | ICD-10-CM | POA: Diagnosis not present

## 2019-10-07 DIAGNOSIS — R262 Difficulty in walking, not elsewhere classified: Secondary | ICD-10-CM | POA: Diagnosis not present

## 2019-10-07 DIAGNOSIS — M6281 Muscle weakness (generalized): Secondary | ICD-10-CM | POA: Diagnosis not present

## 2019-10-07 DIAGNOSIS — R278 Other lack of coordination: Secondary | ICD-10-CM | POA: Diagnosis not present

## 2019-10-08 DIAGNOSIS — M6281 Muscle weakness (generalized): Secondary | ICD-10-CM | POA: Diagnosis not present

## 2019-10-08 DIAGNOSIS — R278 Other lack of coordination: Secondary | ICD-10-CM | POA: Diagnosis not present

## 2019-10-08 DIAGNOSIS — R262 Difficulty in walking, not elsewhere classified: Secondary | ICD-10-CM | POA: Diagnosis not present

## 2019-10-10 DIAGNOSIS — R278 Other lack of coordination: Secondary | ICD-10-CM | POA: Diagnosis not present

## 2019-10-10 DIAGNOSIS — M6281 Muscle weakness (generalized): Secondary | ICD-10-CM | POA: Diagnosis not present

## 2019-10-10 DIAGNOSIS — R262 Difficulty in walking, not elsewhere classified: Secondary | ICD-10-CM | POA: Diagnosis not present

## 2019-10-14 DIAGNOSIS — M6281 Muscle weakness (generalized): Secondary | ICD-10-CM | POA: Diagnosis not present

## 2019-10-14 DIAGNOSIS — R278 Other lack of coordination: Secondary | ICD-10-CM | POA: Diagnosis not present

## 2019-10-14 DIAGNOSIS — R262 Difficulty in walking, not elsewhere classified: Secondary | ICD-10-CM | POA: Diagnosis not present

## 2019-10-21 DIAGNOSIS — M6281 Muscle weakness (generalized): Secondary | ICD-10-CM | POA: Diagnosis not present

## 2019-10-21 DIAGNOSIS — R278 Other lack of coordination: Secondary | ICD-10-CM | POA: Diagnosis not present

## 2019-10-21 DIAGNOSIS — R293 Abnormal posture: Secondary | ICD-10-CM | POA: Diagnosis not present

## 2019-10-22 DIAGNOSIS — Z20828 Contact with and (suspected) exposure to other viral communicable diseases: Secondary | ICD-10-CM | POA: Diagnosis not present

## 2019-10-24 DIAGNOSIS — R293 Abnormal posture: Secondary | ICD-10-CM | POA: Diagnosis not present

## 2019-10-24 DIAGNOSIS — M6281 Muscle weakness (generalized): Secondary | ICD-10-CM | POA: Diagnosis not present

## 2019-10-24 DIAGNOSIS — R278 Other lack of coordination: Secondary | ICD-10-CM | POA: Diagnosis not present

## 2019-10-28 DIAGNOSIS — R278 Other lack of coordination: Secondary | ICD-10-CM | POA: Diagnosis not present

## 2019-10-28 DIAGNOSIS — R293 Abnormal posture: Secondary | ICD-10-CM | POA: Diagnosis not present

## 2019-10-28 DIAGNOSIS — M6281 Muscle weakness (generalized): Secondary | ICD-10-CM | POA: Diagnosis not present

## 2019-10-29 DIAGNOSIS — Z20828 Contact with and (suspected) exposure to other viral communicable diseases: Secondary | ICD-10-CM | POA: Diagnosis not present

## 2019-10-30 DIAGNOSIS — R278 Other lack of coordination: Secondary | ICD-10-CM | POA: Diagnosis not present

## 2019-10-30 DIAGNOSIS — R293 Abnormal posture: Secondary | ICD-10-CM | POA: Diagnosis not present

## 2019-10-30 DIAGNOSIS — M6281 Muscle weakness (generalized): Secondary | ICD-10-CM | POA: Diagnosis not present

## 2019-11-01 DIAGNOSIS — M6281 Muscle weakness (generalized): Secondary | ICD-10-CM | POA: Diagnosis not present

## 2019-11-01 DIAGNOSIS — R293 Abnormal posture: Secondary | ICD-10-CM | POA: Diagnosis not present

## 2019-11-01 DIAGNOSIS — R278 Other lack of coordination: Secondary | ICD-10-CM | POA: Diagnosis not present

## 2019-11-04 DIAGNOSIS — R293 Abnormal posture: Secondary | ICD-10-CM | POA: Diagnosis not present

## 2019-11-04 DIAGNOSIS — M6281 Muscle weakness (generalized): Secondary | ICD-10-CM | POA: Diagnosis not present

## 2019-11-04 DIAGNOSIS — R278 Other lack of coordination: Secondary | ICD-10-CM | POA: Diagnosis not present

## 2019-11-05 DIAGNOSIS — R293 Abnormal posture: Secondary | ICD-10-CM | POA: Diagnosis not present

## 2019-11-05 DIAGNOSIS — Z20828 Contact with and (suspected) exposure to other viral communicable diseases: Secondary | ICD-10-CM | POA: Diagnosis not present

## 2019-11-05 DIAGNOSIS — R278 Other lack of coordination: Secondary | ICD-10-CM | POA: Diagnosis not present

## 2019-11-05 DIAGNOSIS — M6281 Muscle weakness (generalized): Secondary | ICD-10-CM | POA: Diagnosis not present

## 2019-11-07 DIAGNOSIS — M6281 Muscle weakness (generalized): Secondary | ICD-10-CM | POA: Diagnosis not present

## 2019-11-07 DIAGNOSIS — R293 Abnormal posture: Secondary | ICD-10-CM | POA: Diagnosis not present

## 2019-11-07 DIAGNOSIS — R278 Other lack of coordination: Secondary | ICD-10-CM | POA: Diagnosis not present

## 2019-11-12 DIAGNOSIS — Z20828 Contact with and (suspected) exposure to other viral communicable diseases: Secondary | ICD-10-CM | POA: Diagnosis not present

## 2019-11-12 DIAGNOSIS — R293 Abnormal posture: Secondary | ICD-10-CM | POA: Diagnosis not present

## 2019-11-12 DIAGNOSIS — M6281 Muscle weakness (generalized): Secondary | ICD-10-CM | POA: Diagnosis not present

## 2019-11-12 DIAGNOSIS — R278 Other lack of coordination: Secondary | ICD-10-CM | POA: Diagnosis not present

## 2019-11-14 DIAGNOSIS — R278 Other lack of coordination: Secondary | ICD-10-CM | POA: Diagnosis not present

## 2019-11-14 DIAGNOSIS — M6281 Muscle weakness (generalized): Secondary | ICD-10-CM | POA: Diagnosis not present

## 2019-11-14 DIAGNOSIS — R293 Abnormal posture: Secondary | ICD-10-CM | POA: Diagnosis not present

## 2019-11-18 DIAGNOSIS — M6281 Muscle weakness (generalized): Secondary | ICD-10-CM | POA: Diagnosis not present

## 2019-11-18 DIAGNOSIS — R293 Abnormal posture: Secondary | ICD-10-CM | POA: Diagnosis not present

## 2019-11-18 DIAGNOSIS — R278 Other lack of coordination: Secondary | ICD-10-CM | POA: Diagnosis not present

## 2019-11-19 DIAGNOSIS — Z20828 Contact with and (suspected) exposure to other viral communicable diseases: Secondary | ICD-10-CM | POA: Diagnosis not present

## 2019-11-20 DIAGNOSIS — M6281 Muscle weakness (generalized): Secondary | ICD-10-CM | POA: Diagnosis not present

## 2019-11-20 DIAGNOSIS — R293 Abnormal posture: Secondary | ICD-10-CM | POA: Diagnosis not present

## 2019-11-20 DIAGNOSIS — R278 Other lack of coordination: Secondary | ICD-10-CM | POA: Diagnosis not present

## 2019-11-20 NOTE — Progress Notes (Signed)
Cardiology Office Note:    Date:  11/21/2019   ID:  [name] [name], DOB 12-Oct-1933, MRN [id number]  PCP:  [name], MD  Cardiologist:  [name], MD    Referring MD: [name], MD   Chief Complaint  Patient presents with  . Coronary Artery Disease  . Atrial Fibrillation  . Cardiomyopathy  . Congestive Heart Failure  . Hypertension    History of Present Illness:    [name] [name] is a 86 y.o. male with a hx of CAD status post DES to the RI and known chronic occlusion of the LAD, PAF, atrial flutter status post ablation diastolic CHF, CKD, hypertension, DM.  Patient has previously not been anticoagulated due to a history of GI bleed. 02/2018 he was bradycardic with profound hyperkalemia. Heart rate recovered with reversal of hyperkalemia but then he went into atrial fib with RVR while amiodarone and beta-blocker on hold. He was placed on Eliquis and underwent TEE guided DCCV. EF 35 to 40%. Follow-up echo 02/2018 EF 40 to 45%. CHA2DS2-VASc equals 6.last saw [name] 07/16/2018 and was in normal sinus rhythm. He has chronic LE edema.    He is here today for followup and is doing well.  He denies any chest pain or pressure, SOB, DOE, PND, orthopnea, LE edema, dizziness, palpitations or syncope. He is compliant with his meds and is tolerating meds with no SE.  Marland Kitchen     Past Medical History:  Diagnosis Date  . Allergic reaction caused by a drug 11/2017  . Anemia   . Atrial flutter ([place])    a. s/p RFCA of counterclockwise cavo-tricuspid isthmus dependent atrial flutter 2009 by Dr. Rayann Heman.  Marland Kitchen CAD (coronary artery disease) 06/2014   a. 06/2014: abnl nuc. Cath: Totally occluded LAD with faint collaterals (not a candidate for CTO PCI), 90% ramus s/p DES, 50-70% OM2.  . Chronic diastolic CHF (congestive heart failure) ([place])    a. 2D Echo 10/23/13: EF 50-55%, basal mid-inf HK, grade 2 DD, mild MR, mod dilated LA, no sig change from prior.  . CKD (chronic kidney disease), stage  III   . Colostomy in place [facility])   . Complication of anesthesia    " during my kidney stone surgery my heart went out of rhythm  . Compression fracture of L1 lumbar vertebra ([name])   . DCM (dilated cardiomyopathy) ([place]) 02/27/2018   EF 35-40% by TEE 02/2018 felt tachycardia mediated  . Diabetes ([place])    TYPE 2   . Dyslipidemia   . Dyspnea   . Frequent PVCs   . Gastric ulcer   . GERD (gastroesophageal reflux disease)   . History of blood transfusion   . History of kidney stones   . History of peptic ulcer disease   . HTN (hypertension)   . Hx of acute renal failure 01/02/10-3/24-11   due to hypovolemic shock, gastroenteritis and dehydration,hospitalized . Did requre a few days of dialysis. Cr at discharge was 1.8  . Kidney disease   . Kidney stone may 2009   Right hydronephrosis, S/P stone removal  . Lower back pain   . Macular degeneration   . OA (osteoarthritis) of hip   . Obesity   . Osteopenia   . PAF (paroxysmal atrial fibrillation) ([name])    not on long term anticoagulation due to history of heme positive stools and anemia  . Shingles    episode  . Small bowel obstruction, partial ([place]) 2009   Episode  . Ulcerative colitis (  [place])    a. s/p total colectomy remotely.    Past Surgical History:  Procedure Laterality Date  . BACK SURGERY    . BIOPSY  07/28/2018   Procedure: BIOPSY;  Surgeon: Thornton Park, MD;  Location: [place];  Service: Gastroenterology;;  . CARDIAC CATHETERIZATION  06/19/2014  . CARDIOVERSION N/A 02/23/2018   Procedure: CARDIOVERSION;  Surgeon: Dorothy Spark, MD;  Location: [facility] ENDOSCOPY;  Service: Cardiovascular;  Laterality: N/A;  . COLON RESECTION    . COLOSTOMY    . CORONARY ANGIOPLASTY    . CORONARY STENT PLACEMENT  06/19/2014   DES       dr Martinique  . ESOPHAGOGASTRODUODENOSCOPY (EGD) WITH PROPOFOL N/A 07/28/2018   Procedure: ESOPHAGOGASTRODUODENOSCOPY (EGD) WITH PROPOFOL;  Surgeon: Thornton Park, MD;  Location: [place];   Service: Gastroenterology;  Laterality: N/A;  . HARDWARE REMOVAL Left 12/01/2017   Procedure: HARDWARE REMOVAL LEFT ANKLE;  Surgeon: Newt Minion, MD;  Location: [place];  Service: Orthopedics;  Laterality: Left;  . HERNIA REPAIR    . LEFT AND RIGHT HEART CATHETERIZATION WITH CORONARY ANGIOGRAM N/A 06/19/2014   Procedure: LEFT AND RIGHT HEART CATHETERIZATION WITH CORONARY ANGIOGRAM;  Surgeon: Peter M Martinique, MD;  Location: [facility] CATH LAB;  Service: Cardiovascular;  Laterality: N/A;  . ORIF ANKLE FRACTURE Left 10/25/2017   Procedure: OPEN REDUCTION INTERNAL FIXATION (ORIF) LEFT ANKLE FRACTURE;  Surgeon: Newt Minion, MD;  Location: [place];  Service: Orthopedics;  Laterality: Left;  . TEE WITHOUT CARDIOVERSION N/A 02/23/2018   Procedure: TRANSESOPHAGEAL ECHOCARDIOGRAM (TEE);  Surgeon: Dorothy Spark, MD;  Location: [facility];  Service: Cardiovascular;  Laterality: N/A;  . TONSILLECTOMY      Current Medications: Current Meds  Medication Sig  . acetaminophen (TYLENOL) 325 MG tablet Take 2 tablets (650 mg total) by mouth every 6 (six) hours as needed for mild pain, moderate pain or headache.  Marland Kitchen amiodarone (PACERONE) 200 MG tablet Take 200 mg by mouth daily.  Marland Kitchen apixaban (ELIQUIS) 2.5 MG TABS tablet Take 1 tablet (2.5 mg total) by mouth 2 (two) times daily.  Marland Kitchen atorvastatin (LIPITOR) 40 MG tablet Take 1 tablet (40 mg total) by mouth daily at 6 PM.  . Dermatological Products, Misc. (MOISTURE BARRIER) OINT Apply 1 application topically 3 (three) times daily. To buttocks/sacrum  . docusate sodium (COLACE) 100 MG capsule Take 100 mg by mouth 2 (two) times daily.  . furosemide (LASIX) 40 MG tablet Take 1.5 tablets (60 mg total) by mouth 2 (two) times daily.  Marland Kitchen glimepiride (AMARYL) 1 MG tablet Take 1 mg by mouth 2 (two) times daily with a meal.   . Multiple Vitamin (MULTIVITAMIN WITH MINERALS) TABS tablet Take 1 tablet by mouth daily.  Marland Kitchen NYAMYC powder   . pantoprazole (PROTONIX) 40 MG tablet Take 1 tablet  (40 mg total) by mouth 2 (two) times daily.  . saxagliptin HCl (ONGLYZA) 2.5 MG TABS tablet Take 2.5 mg by mouth daily.  Nelva Nay SOLOSTAR 300 UNIT/ML SOPN Inject 10 Units into the skin daily.      Allergies:   Brilinta [ticagrelor], Clindamycin/lincomycin, Doxycycline, Penicillins, Broccoli [brassica oleracea], Sulfa antibiotics, and Tape   Social History   Socioeconomic History  . Marital status: Widowed    Spouse name: Not on file  . Number of children: Not on file  . Years of education: Not on file  . Highest education level: Not on file  Occupational History  . Not on file  Tobacco Use  . Smoking status: Never  Smoker  . Smokeless tobacco: Never Used  Substance and Sexual Activity  . Alcohol use: No  . Drug use: No  . Sexual activity: Not on file  Other Topics Concern  . Not on file  Social History Narrative  . Not on file   Social Determinants of Health   Financial Resource Strain:   . Difficulty of Paying Living Expenses: Not on file  Food Insecurity:   . Worried About Charity fundraiser in the Last Year: Not on file  . Ran Out of Food in the Last Year: Not on file  Transportation Needs:   . Lack of Transportation (Medical): Not on file  . Lack of Transportation (Non-Medical): Not on file  Physical Activity:   . Days of Exercise per Week: Not on file  . Minutes of Exercise per Session: Not on file  Stress:   . Feeling of Stress : Not on file  Social Connections:   . Frequency of Communication with Friends and Family: Not on file  . Frequency of Social Gatherings with Friends and Family: Not on file  . Attends Religious Services: Not on file  . Active Member of Clubs or Organizations: Not on file  . Attends Archivist Meetings: Not on file  . Marital Status: Not on file     Family History: The patient's family history includes Colon cancer in his mother. There is no history of Esophageal cancer, Stomach cancer, Pancreatic cancer, or Liver  disease.  ROS:   Please see the history of present illness.    ROS  All other systems reviewed and negative.   EKGs/Labs/Other Studies Reviewed:    The following studies were reviewed today: Labs, EKG  EKG:  EKG is  ordered today.  The ekg ordered today demonstrates NSR with PACs  Recent Labs: No results found for requested labs within last 8760 hours.   Recent Lipid Panel    Component Value Date/Time   CHOL 112 03/27/2018 0547   TRIG 119 03/27/2018 0547   HDL 36 (L) 03/27/2018 0547   CHOLHDL 3.1 03/27/2018 0547   VLDL 24 03/27/2018 0547   LDLCALC 52 03/27/2018 0547    Physical Exam:    VS:  BP 136/68   Pulse 80   Ht 5' 10"  (1.778 m)   Wt 242 lb (109.8 kg)   BMI 34.72 kg/m     Wt Readings from Last 3 Encounters:  11/21/19 242 lb (109.8 kg)  06/11/19 236 lb (107 kg)  11/20/18 231 lb 12.8 oz (105.1 kg)     GEN:  Well nourished, well developed in no acute distress HEENT: Normal NECK: No JVD; No carotid bruits LYMPHATICS: No lymphadenopathy CARDIAC: RRR, no murmurs, rubs, gallops RESPIRATORY:  Clear to auscultation without rales, wheezing or rhonchi  ABDOMEN: Soft, non-tender, non-distended MUSCULOSKELETAL:  No edema; No deformity  SKIN: Warm and dry NEUROLOGIC:  Alert and oriented x 3 PSYCHIATRIC:  Normal affect   ASSESSMENT:    1. Coronary artery disease involving native coronary artery of native heart without angina pectoris   2. Persistent atrial fibrillation ([place])   3. DCM (dilated cardiomyopathy) ([place])   4. Chronic combined systolic and diastolic CHF (congestive heart failure) ([place])   5. Essential hypertension   6. Dyslipidemia    PLAN:    In order of problems listed above:  1.  ASCAD -status post DES to the RA with known occlusion of the LAD -he denies any anginal sx -continue statin -no ASA due to  DOAC  2.  Persistent atrial fibrillation -status post TEE guided DCCV on 02/2018 -denies any palpitations -he is maintaining NSR -continue  Apixaban 2.68m BID for age > 852and Cr.1.5 -continue Amio 2035mdaily -[place] [facility].  DCM -EF was 40-45% on echo in May 2019  4.  Chronic combined systolic/diastolic CHF - He appears euvolemic on exam -his weight is stable -LE edema is under good control -continue Lasix 6023mID -check CMET from [facility].  HTN -BP controlled -currently not on antihypertensive meds  6.  HLD -LDL goal < 70 -continue Atorvastatin 48m42mily -[facility]  [place] Adjust[place]/Labs and Tests Ordered: Current medicines are reviewed at length with the patient today.  Concerns regarding medicines are outlined above.  Orders Placed This Encounter  Procedures  . EKG 12-Lead   No orders of the defined types were placed in this encounter.   Signed, TracFransico Him  11/21/2019 1:18 PM    [place]

## 2019-11-21 ENCOUNTER — Encounter: Payer: Self-pay | Admitting: Cardiology

## 2019-11-21 ENCOUNTER — Other Ambulatory Visit: Payer: Self-pay

## 2019-11-21 ENCOUNTER — Ambulatory Visit (INDEPENDENT_AMBULATORY_CARE_PROVIDER_SITE_OTHER): Payer: Medicare Other | Admitting: Cardiology

## 2019-11-21 VITALS — BP 136/68 | HR 80 | Ht 70.0 in | Wt 242.0 lb

## 2019-11-21 DIAGNOSIS — I2510 Atherosclerotic heart disease of native coronary artery without angina pectoris: Secondary | ICD-10-CM | POA: Diagnosis not present

## 2019-11-21 DIAGNOSIS — N183 Chronic kidney disease, stage 3 unspecified: Secondary | ICD-10-CM | POA: Diagnosis not present

## 2019-11-21 DIAGNOSIS — D649 Anemia, unspecified: Secondary | ICD-10-CM | POA: Diagnosis not present

## 2019-11-21 DIAGNOSIS — N400 Enlarged prostate without lower urinary tract symptoms: Secondary | ICD-10-CM | POA: Diagnosis not present

## 2019-11-21 DIAGNOSIS — I509 Heart failure, unspecified: Secondary | ICD-10-CM | POA: Diagnosis not present

## 2019-11-21 DIAGNOSIS — I4819 Other persistent atrial fibrillation: Secondary | ICD-10-CM | POA: Diagnosis not present

## 2019-11-21 DIAGNOSIS — N184 Chronic kidney disease, stage 4 (severe): Secondary | ICD-10-CM | POA: Diagnosis not present

## 2019-11-21 DIAGNOSIS — E785 Hyperlipidemia, unspecified: Secondary | ICD-10-CM | POA: Diagnosis not present

## 2019-11-21 DIAGNOSIS — E782 Mixed hyperlipidemia: Secondary | ICD-10-CM | POA: Diagnosis not present

## 2019-11-21 DIAGNOSIS — R278 Other lack of coordination: Secondary | ICD-10-CM | POA: Diagnosis not present

## 2019-11-21 DIAGNOSIS — R293 Abnormal posture: Secondary | ICD-10-CM | POA: Diagnosis not present

## 2019-11-21 DIAGNOSIS — E11319 Type 2 diabetes mellitus with unspecified diabetic retinopathy without macular edema: Secondary | ICD-10-CM | POA: Diagnosis not present

## 2019-11-21 DIAGNOSIS — I10 Essential (primary) hypertension: Secondary | ICD-10-CM | POA: Diagnosis not present

## 2019-11-21 DIAGNOSIS — I5042 Chronic combined systolic (congestive) and diastolic (congestive) heart failure: Secondary | ICD-10-CM

## 2019-11-21 DIAGNOSIS — I420 Dilated cardiomyopathy: Secondary | ICD-10-CM

## 2019-11-21 DIAGNOSIS — E1122 Type 2 diabetes mellitus with diabetic chronic kidney disease: Secondary | ICD-10-CM | POA: Diagnosis not present

## 2019-11-21 DIAGNOSIS — M6281 Muscle weakness (generalized): Secondary | ICD-10-CM | POA: Diagnosis not present

## 2019-11-21 NOTE — Patient Instructions (Signed)

## 2019-11-22 DIAGNOSIS — R278 Other lack of coordination: Secondary | ICD-10-CM | POA: Diagnosis not present

## 2019-11-22 DIAGNOSIS — M6281 Muscle weakness (generalized): Secondary | ICD-10-CM | POA: Diagnosis not present

## 2019-11-22 DIAGNOSIS — R293 Abnormal posture: Secondary | ICD-10-CM | POA: Diagnosis not present

## 2019-11-27 DIAGNOSIS — M6281 Muscle weakness (generalized): Secondary | ICD-10-CM | POA: Diagnosis not present

## 2019-11-27 DIAGNOSIS — R278 Other lack of coordination: Secondary | ICD-10-CM | POA: Diagnosis not present

## 2019-11-27 DIAGNOSIS — R293 Abnormal posture: Secondary | ICD-10-CM | POA: Diagnosis not present

## 2019-11-28 DIAGNOSIS — M6281 Muscle weakness (generalized): Secondary | ICD-10-CM | POA: Diagnosis not present

## 2019-11-28 DIAGNOSIS — R278 Other lack of coordination: Secondary | ICD-10-CM | POA: Diagnosis not present

## 2019-11-28 DIAGNOSIS — R293 Abnormal posture: Secondary | ICD-10-CM | POA: Diagnosis not present

## 2019-11-29 DIAGNOSIS — R278 Other lack of coordination: Secondary | ICD-10-CM | POA: Diagnosis not present

## 2019-11-29 DIAGNOSIS — R293 Abnormal posture: Secondary | ICD-10-CM | POA: Diagnosis not present

## 2019-11-29 DIAGNOSIS — M6281 Muscle weakness (generalized): Secondary | ICD-10-CM | POA: Diagnosis not present

## 2019-12-03 DIAGNOSIS — R293 Abnormal posture: Secondary | ICD-10-CM | POA: Diagnosis not present

## 2019-12-03 DIAGNOSIS — M6281 Muscle weakness (generalized): Secondary | ICD-10-CM | POA: Diagnosis not present

## 2019-12-03 DIAGNOSIS — R278 Other lack of coordination: Secondary | ICD-10-CM | POA: Diagnosis not present

## 2019-12-07 DIAGNOSIS — R278 Other lack of coordination: Secondary | ICD-10-CM | POA: Diagnosis not present

## 2019-12-07 DIAGNOSIS — M6281 Muscle weakness (generalized): Secondary | ICD-10-CM | POA: Diagnosis not present

## 2019-12-07 DIAGNOSIS — R293 Abnormal posture: Secondary | ICD-10-CM | POA: Diagnosis not present

## 2019-12-09 DIAGNOSIS — R278 Other lack of coordination: Secondary | ICD-10-CM | POA: Diagnosis not present

## 2019-12-09 DIAGNOSIS — R293 Abnormal posture: Secondary | ICD-10-CM | POA: Diagnosis not present

## 2019-12-09 DIAGNOSIS — M6281 Muscle weakness (generalized): Secondary | ICD-10-CM | POA: Diagnosis not present

## 2019-12-11 DIAGNOSIS — R293 Abnormal posture: Secondary | ICD-10-CM | POA: Diagnosis not present

## 2019-12-11 DIAGNOSIS — M6281 Muscle weakness (generalized): Secondary | ICD-10-CM | POA: Diagnosis not present

## 2019-12-11 DIAGNOSIS — R278 Other lack of coordination: Secondary | ICD-10-CM | POA: Diagnosis not present

## 2019-12-12 DIAGNOSIS — R278 Other lack of coordination: Secondary | ICD-10-CM | POA: Diagnosis not present

## 2019-12-12 DIAGNOSIS — M6281 Muscle weakness (generalized): Secondary | ICD-10-CM | POA: Diagnosis not present

## 2019-12-12 DIAGNOSIS — R293 Abnormal posture: Secondary | ICD-10-CM | POA: Diagnosis not present

## 2019-12-18 DIAGNOSIS — M6281 Muscle weakness (generalized): Secondary | ICD-10-CM | POA: Diagnosis not present

## 2019-12-18 DIAGNOSIS — R278 Other lack of coordination: Secondary | ICD-10-CM | POA: Diagnosis not present

## 2019-12-18 DIAGNOSIS — R293 Abnormal posture: Secondary | ICD-10-CM | POA: Diagnosis not present

## 2019-12-19 DIAGNOSIS — M6281 Muscle weakness (generalized): Secondary | ICD-10-CM | POA: Diagnosis not present

## 2019-12-19 DIAGNOSIS — R293 Abnormal posture: Secondary | ICD-10-CM | POA: Diagnosis not present

## 2019-12-19 DIAGNOSIS — R278 Other lack of coordination: Secondary | ICD-10-CM | POA: Diagnosis not present

## 2019-12-20 DIAGNOSIS — N183 Chronic kidney disease, stage 3 unspecified: Secondary | ICD-10-CM | POA: Diagnosis not present

## 2019-12-20 DIAGNOSIS — I509 Heart failure, unspecified: Secondary | ICD-10-CM | POA: Diagnosis not present

## 2019-12-20 DIAGNOSIS — I10 Essential (primary) hypertension: Secondary | ICD-10-CM | POA: Diagnosis not present

## 2019-12-20 DIAGNOSIS — M810 Age-related osteoporosis without current pathological fracture: Secondary | ICD-10-CM | POA: Diagnosis not present

## 2019-12-20 DIAGNOSIS — N400 Enlarged prostate without lower urinary tract symptoms: Secondary | ICD-10-CM | POA: Diagnosis not present

## 2019-12-20 DIAGNOSIS — E1165 Type 2 diabetes mellitus with hyperglycemia: Secondary | ICD-10-CM | POA: Diagnosis not present

## 2019-12-20 DIAGNOSIS — I2510 Atherosclerotic heart disease of native coronary artery without angina pectoris: Secondary | ICD-10-CM | POA: Diagnosis not present

## 2019-12-20 DIAGNOSIS — E1122 Type 2 diabetes mellitus with diabetic chronic kidney disease: Secondary | ICD-10-CM | POA: Diagnosis not present

## 2019-12-20 DIAGNOSIS — N184 Chronic kidney disease, stage 4 (severe): Secondary | ICD-10-CM | POA: Diagnosis not present

## 2019-12-20 DIAGNOSIS — D649 Anemia, unspecified: Secondary | ICD-10-CM | POA: Diagnosis not present

## 2019-12-20 DIAGNOSIS — M8580 Other specified disorders of bone density and structure, unspecified site: Secondary | ICD-10-CM | POA: Diagnosis not present

## 2019-12-20 DIAGNOSIS — E782 Mixed hyperlipidemia: Secondary | ICD-10-CM | POA: Diagnosis not present

## 2019-12-23 DIAGNOSIS — R278 Other lack of coordination: Secondary | ICD-10-CM | POA: Diagnosis not present

## 2019-12-23 DIAGNOSIS — M6281 Muscle weakness (generalized): Secondary | ICD-10-CM | POA: Diagnosis not present

## 2019-12-23 DIAGNOSIS — R293 Abnormal posture: Secondary | ICD-10-CM | POA: Diagnosis not present

## 2020-01-28 DIAGNOSIS — K279 Peptic ulcer, site unspecified, unspecified as acute or chronic, without hemorrhage or perforation: Secondary | ICD-10-CM | POA: Diagnosis not present

## 2020-01-28 DIAGNOSIS — E11319 Type 2 diabetes mellitus with unspecified diabetic retinopathy without macular edema: Secondary | ICD-10-CM | POA: Diagnosis not present

## 2020-01-28 DIAGNOSIS — I2510 Atherosclerotic heart disease of native coronary artery without angina pectoris: Secondary | ICD-10-CM | POA: Diagnosis not present

## 2020-01-28 DIAGNOSIS — K219 Gastro-esophageal reflux disease without esophagitis: Secondary | ICD-10-CM | POA: Diagnosis not present

## 2020-01-28 DIAGNOSIS — E782 Mixed hyperlipidemia: Secondary | ICD-10-CM | POA: Diagnosis not present

## 2020-01-28 DIAGNOSIS — I4891 Unspecified atrial fibrillation: Secondary | ICD-10-CM | POA: Diagnosis not present

## 2020-01-28 DIAGNOSIS — I5030 Unspecified diastolic (congestive) heart failure: Secondary | ICD-10-CM | POA: Diagnosis not present

## 2020-01-28 DIAGNOSIS — E1122 Type 2 diabetes mellitus with diabetic chronic kidney disease: Secondary | ICD-10-CM | POA: Diagnosis not present

## 2020-01-28 DIAGNOSIS — I872 Venous insufficiency (chronic) (peripheral): Secondary | ICD-10-CM | POA: Diagnosis not present

## 2020-01-28 DIAGNOSIS — I10 Essential (primary) hypertension: Secondary | ICD-10-CM | POA: Diagnosis not present

## 2020-01-28 DIAGNOSIS — N183 Chronic kidney disease, stage 3 unspecified: Secondary | ICD-10-CM | POA: Diagnosis not present

## 2020-01-28 DIAGNOSIS — I700 Atherosclerosis of aorta: Secondary | ICD-10-CM | POA: Diagnosis not present

## 2020-02-18 ENCOUNTER — Telehealth: Payer: Self-pay | Admitting: Cardiology

## 2020-02-18 NOTE — Telephone Encounter (Signed)
New Message  1. What dental office are you calling from? [facility] (they set up patient's dentist appt, Helene Kelp states that she will find out the dentist office name, phone number, and fax number before calling back)   2. What is your office phone number?    3. What is your fax number?   4. What type of procedure is the patient having performed? Extractions   5. What date is procedure scheduled or is the patient there now? TBD (if the patient is at the dentist's office question goes to their cardiologist if he/she is in the office.  If not, question should go to the DOD).   6. What is your question (ex. Antibiotics prior to procedure, holding medication-we need to know how long dentist wants pt to hold med)? Holding Medication (blood thinner)

## 2020-02-20 NOTE — Telephone Encounter (Signed)
Spoke with Helene Kelp at [place] and informed me for the type of clearance th patient needs which is listed below. Helene Kelp also needs for our office to fax over the recommendations to [facility] as well as the Dentist office.   [place] attnHelene Kelp Phone #: [phone]  Fax: [phone]

## 2020-02-20 NOTE — Telephone Encounter (Signed)
   Primary Cardiologist: [name], MD  Chart reviewed as part of pre-operative protocol coverage. Simple dental extractions are considered low risk procedures per guidelines and generally do not require any specific cardiac clearance. It is also generally accepted that for simple extractions and dental cleanings, there is no need to interrupt blood thinner therapy.   SBE prophylaxis is not required for the patient.  I will route this recommendation to the requesting party via Epic fax function and remove from pre-op pool.  Please call with questions.  Reino Bellis, NP 02/20/2020, 5:06 PM

## 2020-02-20 NOTE — Telephone Encounter (Signed)
   [facility] Pre-operative Risk Assessment    Request for surgical clearance:  1. What type of surgery is being performed? 1 TOOTH EXTRACTION  2. When is this surgery scheduled? TBD   3. What type of clearance is required (medical clearance vs. Pharmacy clearance to hold med vs. Both)? PHARMACY  4. Are there any medications that need to be held prior to surgery and how long? ELIQUIS   5. Practice name and name of physician performing surgery? [facility]; DR. Lavella Hammock  6. What is your office phone number: [phone]   7.   What is your office fax number [phone]   8.   Anesthesia type (None, local, MAC, general) ? NONE LISTED   Jacinta Shoe 02/20/2020, 3:43 PM  _________________________________________________________________   (provider comments below)

## 2020-04-02 ENCOUNTER — Other Ambulatory Visit: Payer: Self-pay

## 2020-04-02 ENCOUNTER — Encounter (INDEPENDENT_AMBULATORY_CARE_PROVIDER_SITE_OTHER): Payer: Self-pay | Admitting: Ophthalmology

## 2020-04-02 ENCOUNTER — Ambulatory Visit (INDEPENDENT_AMBULATORY_CARE_PROVIDER_SITE_OTHER): Payer: Medicare Other | Admitting: Ophthalmology

## 2020-04-02 DIAGNOSIS — H353132 Nonexudative age-related macular degeneration, bilateral, intermediate dry stage: Secondary | ICD-10-CM | POA: Diagnosis not present

## 2020-04-02 DIAGNOSIS — H353122 Nonexudative age-related macular degeneration, left eye, intermediate dry stage: Secondary | ICD-10-CM | POA: Insufficient documentation

## 2020-04-02 NOTE — Progress Notes (Signed)
04/02/2020     CHIEF COMPLAINT Patient presents for Retina Follow Up   HISTORY OF PRESENT ILLNESS: [name] [name] is a 86 y.o. male who presents to the clinic today for:   HPI    Retina Follow Up    Patient presents with  Dry AMD.  In both eyes.  Duration of 6 months.  Since onset it is stable.          Comments    6 month follow up - OCT OU Patient denies change in vision and overall has no complaints.        Last edited by Gerda Diss on 04/02/2020  1:05 PM. (History)      Referring physician: [name], MD [employer] [address] [place],  [place] [address]  HISTORICAL INFORMATION:   Selected notes from the MEDICAL RECORD NUMBER    Lab Results  Component Value Date   HGBA1C 6.7 (H) 03/27/2018     CURRENT MEDICATIONS: No current outpatient medications on file. (Ophthalmic Drugs)   No current facility-administered medications for this visit. (Ophthalmic Drugs)   Current Outpatient Medications (Other)  Medication Sig  . acetaminophen (TYLENOL) 325 MG tablet Take 2 tablets (650 mg total) by mouth every 6 (six) hours as needed for mild pain, moderate pain or headache.  Marland Kitchen amiodarone (PACERONE) 200 MG tablet Take 200 mg by mouth daily.  Marland Kitchen apixaban (ELIQUIS) 2.5 MG TABS tablet Take 1 tablet (2.5 mg total) by mouth 2 (two) times daily.  Marland Kitchen atorvastatin (LIPITOR) 40 MG tablet Take 1 tablet (40 mg total) by mouth daily at 6 PM.  . Dermatological Products, Misc. (MOISTURE BARRIER) OINT Apply 1 application topically 3 (three) times daily. To buttocks/sacrum  . docusate sodium (COLACE) 100 MG capsule Take 100 mg by mouth 2 (two) times daily.  . furosemide (LASIX) 40 MG tablet Take 1.5 tablets (60 mg total) by mouth 2 (two) times daily.  Marland Kitchen glimepiride (AMARYL) 1 MG tablet Take 1 mg by mouth 2 (two) times daily with a meal.   . Multiple Vitamin (MULTIVITAMIN WITH MINERALS) TABS tablet Take 1 tablet by mouth daily.  Marland Kitchen NYAMYC powder   . pantoprazole (PROTONIX) 40 MG  tablet Take 1 tablet (40 mg total) by mouth 2 (two) times daily.  . saxagliptin HCl (ONGLYZA) 2.5 MG TABS tablet Take 2.5 mg by mouth daily.  Nelva Nay SOLOSTAR 300 UNIT/ML SOPN Inject 10 Units into the skin daily.    No current facility-administered medications for this visit. (Other)      REVIEW OF SYSTEMS:    ALLERGIES Allergies  Allergen Reactions  . Brilinta [Ticagrelor] Shortness Of Breath and Other (See Comments)    Changed to Plavix due to SOB  . Clindamycin/Lincomycin Anaphylaxis and Other (See Comments)    Whole body skin peeling, blisters also  . Doxycycline Anaphylaxis  . Penicillins Rash and Other (See Comments)    Has patient had a PCN reaction causing immediate rash, facial/tongue/throat swelling, SOB or lightheadedness with hypotension: Yes Has patient had a PCN reaction causing severe rash involving mucus membranes or skin necrosis: No Has patient had a PCN reaction that required hospitalization: No Has patient had a PCN reaction occurring within the last 10 years: No If all of the above answers are "NO", then may proceed with Cephalosporin use.   Eual Fines [Brassica Oleracea]     Negatively affects colostomy  . Sulfa Antibiotics Other (See Comments)    "Allergic," per MAR  . Tape Rash  Skin is sensitive; please use an alternative to tape    PAST MEDICAL HISTORY Past Medical History:  Diagnosis Date  . Allergic reaction caused by a drug 11/2017  . Anemia   . Atrial flutter ([place])    a. s/p RFCA of counterclockwise cavo-tricuspid isthmus dependent atrial flutter 2009 by Dr. Rayann Heman.  Marland Kitchen CAD (coronary artery disease) 06/2014   a. 06/2014: abnl nuc. Cath: Totally occluded LAD with faint collaterals (not a candidate for CTO PCI), 90% ramus s/p DES, 50-70% OM2.  . Chronic diastolic CHF (congestive heart failure) ([place])    a. 2D Echo 10/23/13: EF 50-55%, basal mid-inf HK, grade 2 DD, mild MR, mod dilated LA, no sig change from prior.  . CKD (chronic kidney  disease), stage III   . Colostomy in place [facility])   . Complication of anesthesia    " during my kidney stone surgery my heart went out of rhythm  . Compression fracture of L1 lumbar vertebra ([name])   . DCM (dilated cardiomyopathy) ([place]) 02/27/2018   EF 35-40% by TEE 02/2018 felt tachycardia mediated  . Diabetes ([place])    TYPE 2   . Dyslipidemia   . Dyspnea   . Frequent PVCs   . Gastric ulcer   . GERD (gastroesophageal reflux disease)   . History of blood transfusion   . History of kidney stones   . History of peptic ulcer disease   . HTN (hypertension)   . Hx of acute renal failure 01/02/10-3/24-11   due to hypovolemic shock, gastroenteritis and dehydration,hospitalized . Did requre a few days of dialysis. Cr at discharge was 1.8  . Kidney disease   . Kidney stone may 2009   Right hydronephrosis, S/P stone removal  . Lower back pain   . Macular degeneration   . OA (osteoarthritis) of hip   . Obesity   . Osteopenia   . PAF (paroxysmal atrial fibrillation) ([name])    not on long term anticoagulation due to history of heme positive stools and anemia  . Shingles    episode  . Small bowel obstruction, partial ([place]) 2009   Episode  . Ulcerative colitis ([place])    a. s/p total colectomy remotely.   Past Surgical History:  Procedure Laterality Date  . BACK SURGERY    . BIOPSY  07/28/2018   Procedure: BIOPSY;  Surgeon: Thornton Park, MD;  Location: [place];  Service: Gastroenterology;;  . CARDIAC CATHETERIZATION  06/19/2014  . CARDIOVERSION N/A 02/23/2018   Procedure: CARDIOVERSION;  Surgeon: Dorothy Spark, MD;  Location: [facility] ENDOSCOPY;  Service: Cardiovascular;  Laterality: N/A;  . COLON RESECTION    . COLOSTOMY    . CORONARY ANGIOPLASTY    . CORONARY STENT PLACEMENT  06/19/2014   DES       dr Martinique  . ESOPHAGOGASTRODUODENOSCOPY (EGD) WITH PROPOFOL N/A 07/28/2018   Procedure: ESOPHAGOGASTRODUODENOSCOPY (EGD) WITH PROPOFOL;  Surgeon: Thornton Park, MD;  Location: [place];  Service: Gastroenterology;  Laterality: N/A;  . HARDWARE REMOVAL Left 12/01/2017   Procedure: HARDWARE REMOVAL LEFT ANKLE;  Surgeon: Newt Minion, MD;  Location: [place];  Service: Orthopedics;  Laterality: Left;  . HERNIA REPAIR    . LEFT AND RIGHT HEART CATHETERIZATION WITH CORONARY ANGIOGRAM N/A 06/19/2014   Procedure: LEFT AND RIGHT HEART CATHETERIZATION WITH CORONARY ANGIOGRAM;  Surgeon: Peter M Martinique, MD;  Location: [facility] CATH LAB;  Service: Cardiovascular;  Laterality: N/A;  . ORIF ANKLE FRACTURE Left 10/25/2017   Procedure: OPEN REDUCTION INTERNAL FIXATION (ORIF)  LEFT ANKLE FRACTURE;  Surgeon: Newt Minion, MD;  Location: [place];  Service: Orthopedics;  Laterality: Left;  . TEE WITHOUT CARDIOVERSION N/A 02/23/2018   Procedure: TRANSESOPHAGEAL ECHOCARDIOGRAM (TEE);  Surgeon: Dorothy Spark, MD;  Location: [facility] ENDOSCOPY;  Service: Cardiovascular;  Laterality: N/A;  . TONSILLECTOMY      FAMILY HISTORY Family History  Problem Relation Age of Onset  . Colon cancer Mother   . Esophageal cancer Neg Hx   . Stomach cancer Neg Hx   . Pancreatic cancer Neg Hx   . Liver disease Neg Hx     SOCIAL HISTORY Social History   Tobacco Use  . Smoking status: Never Smoker  . Smokeless tobacco: Never Used  Vaping Use  . Vaping Use: Never used  Substance Use Topics  . Alcohol use: No  . Drug use: No         OPHTHALMIC EXAM:  Base Eye Exam    Visual Acuity (Snellen - Linear)      Right Left   Dist [place] 20/50+2 20/40-2   Dist ph [place] NI        Tonometry (Tonopen, 1:11 PM)      Right Left   Pressure 16 12       Pupils      Pupils Dark Light Shape React APD   Right PERRL 4 3 Round Brisk None   Left PERRL 4 3 Round Brisk None       Visual Fields (Counting fingers)   Poor cooperation and understanding       Extraocular Movement      Right Left    Full Full       Neuro/Psych    Oriented x3: Yes   Mood/Affect: Normal       Dilation    Both eyes: 1.0% Mydriacyl,  2.5% Phenylephrine @ 1:11 PM        Slit Lamp and Fundus Exam    External Exam      Right Left   External Normal Normal       Slit Lamp Exam      Right Left   Lids/Lashes Normal Normal   Conjunctiva/Sclera White and quiet White and quiet   Cornea Clear Clear   Anterior Chamber Deep and quiet Deep and quiet   Iris Round and reactive Round and reactive   Lens Posterior chamber intraocular lens Posterior chamber intraocular lens   Anterior Vitreous Normal Normal       Fundus Exam      Right Left   Posterior Vitreous Posterior vitreous detachment Posterior vitreous detachment   Disc Normal Normal   C/D Ratio 0.4 0.4   Macula Geographic atrophy, Retinal pigment epithelial mottling, no membrane, no exudates, no hemorrhage Geographic atrophy, Retinal pigment epithelial mottling, no membrane, no exudates, no hemorrhage   Vessels Normal Normal   Periphery Normal Normal          IMAGING AND PROCEDURES  Imaging and Procedures for 04/02/20  OCT, Retina - OU - Both Eyes       Right Eye Quality was good. Scan locations included subfoveal. Central Foveal Thickness: 190. Progression has been stable. Findings include central retinal atrophy, outer retinal atrophy, inner retinal atrophy.   Left Eye Quality was good. Scan locations included subfoveal. Central Foveal Thickness: 252. Progression has been stable. Findings include abnormal foveal contour, central retinal atrophy, outer retinal atrophy, inner retinal atrophy.  ASSESSMENT/PLAN:  Intermediate stage nonexudative age-related macular degeneration of both eyes The nature of age--related macular degeneration was discussed with the patient as well as the distinction between dry and wet types. Checking an Amsler Grid daily with advice to return immediately should a distortion develop, was given to the patient. The patient 's smoking status now and in the past was determined and advice based on the AREDS study  was provided regarding the consumption of antioxidant supplements. AREDS 2 vitamin formulation was recommended. Consumption of dark leafy vegetables and fresh fruits of various colors was recommended. Treatment modalities for wet macular degeneration particularly the use of intravitreal injections of anti-blood vessel growth factors was discussed with the patient. Avastin, Lucentis, and Eylea are the available options. On occasion, therapy includes the use of photodynamic therapy and thermal laser. Stressed to the patient do not rub eyes. All patient questions were answered.      ICD-10-CM   1. Intermediate stage nonexudative age-related macular degeneration of both eyes  H35.3132 OCT, Retina - OU - Both Eyes    1.  OU, stable OU with dry AMD.  No signs of CN VM OU.  Excellent visual acuity post recent YAG capsulotomy OD  2.  3.  Ophthalmic Meds Ordered this visit:  No orders of the defined types were placed in this encounter.      Return in about 9 months (around 12/31/2020) for COLOR FP, DILATE OU.  There are no Patient Instructions on file for this visit.   Explained the diagnoses, plan, and follow up with the patient and they expressed understanding.  Patient expressed understanding of the importance of proper follow up care.   Clent Demark Dick Hark M.D. Diseases & Surgery of the Retina and Vitreous [facility] [place] 04/02/20     Abbreviations: M myopia (nearsighted); A astigmatism; H hyperopia (farsighted); P presbyopia; Mrx spectacle prescription;  CTL contact lenses; OD right eye; OS left eye; OU both eyes  XT exotropia; ET esotropia; PEK punctate epithelial keratitis; PEE punctate epithelial erosions; DES dry eye syndrome; MGD meibomian gland dysfunction; ATs artificial tears; PFAT's preservative free artificial tears; [place] nuclear sclerotic cataract; PSC posterior subcapsular cataract; ERM epi-retinal membrane; PVD posterior vitreous detachment; RD retinal detachment; DM  diabetes mellitus; DR diabetic retinopathy; NPDR non-proliferative diabetic retinopathy; PDR proliferative diabetic retinopathy; CSME clinically significant macular edema; DME diabetic macular edema; dbh dot blot hemorrhages; CWS cotton wool spot; POAG primary open angle glaucoma; C/D cup-to-disc ratio; HVF humphrey visual field; GVF goldmann visual field; OCT optical coherence tomography; IOP intraocular pressure; BRVO Branch retinal vein occlusion; CRVO central retinal vein occlusion; CRAO central retinal artery occlusion; BRAO branch retinal artery occlusion; RT retinal tear; SB scleral buckle; PPV pars plana vitrectomy; VH Vitreous hemorrhage; PRP panretinal laser photocoagulation; IVK intravitreal kenalog; VMT vitreomacular traction; MH Macular hole;  NVD neovascularization of the disc; NVE neovascularization elsewhere; AREDS age related eye disease study; ARMD age related macular degeneration; POAG primary open angle glaucoma; EBMD epithelial/anterior basement membrane dystrophy; ACIOL anterior chamber intraocular lens; IOL intraocular lens; PCIOL posterior chamber intraocular lens; Phaco/IOL phacoemulsification with intraocular lens placement; [place] photorefractive keratectomy; LASIK laser assisted in situ keratomileusis; HTN hypertension; DM diabetes mellitus; COPD chronic obstructive pulmonary disease

## 2020-04-02 NOTE — Assessment & Plan Note (Signed)
The nature of age--related macular degeneration was discussed with the patient as well as the distinction between dry and wet types. Checking an Amsler Grid daily with advice to return immediately should a distortion develop, was given to the patient. The patient 's smoking status now and in the past was determined and advice based on the AREDS study was provided regarding the consumption of antioxidant supplements. AREDS 2 vitamin formulation was recommended. Consumption of dark leafy vegetables and fresh fruits of various colors was recommended. Treatment modalities for wet macular degeneration particularly the use of intravitreal injections of anti-blood vessel growth factors was discussed with the patient. Avastin, Lucentis, and Eylea are the available options. On occasion, therapy includes the use of photodynamic therapy and thermal laser. Stressed to the patient do not rub eyes. All patient questions were answered.

## 2020-04-16 DIAGNOSIS — M25511 Pain in right shoulder: Secondary | ICD-10-CM | POA: Diagnosis not present

## 2020-04-16 DIAGNOSIS — M6281 Muscle weakness (generalized): Secondary | ICD-10-CM | POA: Diagnosis not present

## 2020-04-17 DIAGNOSIS — M6281 Muscle weakness (generalized): Secondary | ICD-10-CM | POA: Diagnosis not present

## 2020-04-17 DIAGNOSIS — M25511 Pain in right shoulder: Secondary | ICD-10-CM | POA: Diagnosis not present

## 2020-04-21 DIAGNOSIS — M25511 Pain in right shoulder: Secondary | ICD-10-CM | POA: Diagnosis not present

## 2020-04-21 DIAGNOSIS — M6281 Muscle weakness (generalized): Secondary | ICD-10-CM | POA: Diagnosis not present

## 2020-04-23 DIAGNOSIS — M25511 Pain in right shoulder: Secondary | ICD-10-CM | POA: Diagnosis not present

## 2020-04-23 DIAGNOSIS — M6281 Muscle weakness (generalized): Secondary | ICD-10-CM | POA: Diagnosis not present

## 2020-04-24 DIAGNOSIS — M25511 Pain in right shoulder: Secondary | ICD-10-CM | POA: Diagnosis not present

## 2020-04-24 DIAGNOSIS — M6281 Muscle weakness (generalized): Secondary | ICD-10-CM | POA: Diagnosis not present

## 2020-04-29 DIAGNOSIS — M25511 Pain in right shoulder: Secondary | ICD-10-CM | POA: Diagnosis not present

## 2020-04-29 DIAGNOSIS — M6281 Muscle weakness (generalized): Secondary | ICD-10-CM | POA: Diagnosis not present

## 2020-04-30 DIAGNOSIS — M6281 Muscle weakness (generalized): Secondary | ICD-10-CM | POA: Diagnosis not present

## 2020-04-30 DIAGNOSIS — M25511 Pain in right shoulder: Secondary | ICD-10-CM | POA: Diagnosis not present

## 2020-05-01 DIAGNOSIS — M25511 Pain in right shoulder: Secondary | ICD-10-CM | POA: Diagnosis not present

## 2020-05-01 DIAGNOSIS — M6281 Muscle weakness (generalized): Secondary | ICD-10-CM | POA: Diagnosis not present

## 2020-05-03 DIAGNOSIS — N184 Chronic kidney disease, stage 4 (severe): Secondary | ICD-10-CM | POA: Diagnosis not present

## 2020-05-03 DIAGNOSIS — I4891 Unspecified atrial fibrillation: Secondary | ICD-10-CM | POA: Diagnosis not present

## 2020-05-03 DIAGNOSIS — E1122 Type 2 diabetes mellitus with diabetic chronic kidney disease: Secondary | ICD-10-CM | POA: Diagnosis not present

## 2020-05-03 DIAGNOSIS — I5030 Unspecified diastolic (congestive) heart failure: Secondary | ICD-10-CM | POA: Diagnosis not present

## 2020-05-03 DIAGNOSIS — I2510 Atherosclerotic heart disease of native coronary artery without angina pectoris: Secondary | ICD-10-CM | POA: Diagnosis not present

## 2020-05-03 DIAGNOSIS — E1165 Type 2 diabetes mellitus with hyperglycemia: Secondary | ICD-10-CM | POA: Diagnosis not present

## 2020-05-03 DIAGNOSIS — N400 Enlarged prostate without lower urinary tract symptoms: Secondary | ICD-10-CM | POA: Diagnosis not present

## 2020-05-03 DIAGNOSIS — E1340 Other specified diabetes mellitus with diabetic neuropathy, unspecified: Secondary | ICD-10-CM | POA: Diagnosis not present

## 2020-05-03 DIAGNOSIS — E782 Mixed hyperlipidemia: Secondary | ICD-10-CM | POA: Diagnosis not present

## 2020-05-03 DIAGNOSIS — I10 Essential (primary) hypertension: Secondary | ICD-10-CM | POA: Diagnosis not present

## 2020-05-03 DIAGNOSIS — M810 Age-related osteoporosis without current pathological fracture: Secondary | ICD-10-CM | POA: Diagnosis not present

## 2020-05-03 DIAGNOSIS — D649 Anemia, unspecified: Secondary | ICD-10-CM | POA: Diagnosis not present

## 2020-05-04 DIAGNOSIS — M25511 Pain in right shoulder: Secondary | ICD-10-CM | POA: Diagnosis not present

## 2020-05-04 DIAGNOSIS — M6281 Muscle weakness (generalized): Secondary | ICD-10-CM | POA: Diagnosis not present

## 2020-07-30 DIAGNOSIS — E1122 Type 2 diabetes mellitus with diabetic chronic kidney disease: Secondary | ICD-10-CM | POA: Diagnosis not present

## 2020-07-30 DIAGNOSIS — N183 Chronic kidney disease, stage 3 unspecified: Secondary | ICD-10-CM | POA: Diagnosis not present

## 2020-07-30 DIAGNOSIS — K5190 Ulcerative colitis, unspecified, without complications: Secondary | ICD-10-CM | POA: Diagnosis not present

## 2020-07-30 DIAGNOSIS — I5030 Unspecified diastolic (congestive) heart failure: Secondary | ICD-10-CM | POA: Diagnosis not present

## 2020-07-30 DIAGNOSIS — Z1389 Encounter for screening for other disorder: Secondary | ICD-10-CM | POA: Diagnosis not present

## 2020-07-30 DIAGNOSIS — Z0000 Encounter for general adult medical examination without abnormal findings: Secondary | ICD-10-CM | POA: Diagnosis not present

## 2020-07-30 DIAGNOSIS — K279 Peptic ulcer, site unspecified, unspecified as acute or chronic, without hemorrhage or perforation: Secondary | ICD-10-CM | POA: Diagnosis not present

## 2020-07-30 DIAGNOSIS — E782 Mixed hyperlipidemia: Secondary | ICD-10-CM | POA: Diagnosis not present

## 2020-07-30 DIAGNOSIS — K219 Gastro-esophageal reflux disease without esophagitis: Secondary | ICD-10-CM | POA: Diagnosis not present

## 2020-07-30 DIAGNOSIS — Z433 Encounter for attention to colostomy: Secondary | ICD-10-CM | POA: Diagnosis not present

## 2020-07-30 DIAGNOSIS — I4891 Unspecified atrial fibrillation: Secondary | ICD-10-CM | POA: Diagnosis not present

## 2020-07-30 DIAGNOSIS — E11319 Type 2 diabetes mellitus with unspecified diabetic retinopathy without macular edema: Secondary | ICD-10-CM | POA: Diagnosis not present

## 2020-07-30 DIAGNOSIS — I700 Atherosclerosis of aorta: Secondary | ICD-10-CM | POA: Diagnosis not present

## 2020-08-27 DIAGNOSIS — Z23 Encounter for immunization: Secondary | ICD-10-CM | POA: Diagnosis not present

## 2020-09-29 DIAGNOSIS — I509 Heart failure, unspecified: Secondary | ICD-10-CM | POA: Diagnosis not present

## 2020-09-29 DIAGNOSIS — E1122 Type 2 diabetes mellitus with diabetic chronic kidney disease: Secondary | ICD-10-CM | POA: Diagnosis not present

## 2020-09-29 DIAGNOSIS — N184 Chronic kidney disease, stage 4 (severe): Secondary | ICD-10-CM | POA: Diagnosis not present

## 2020-09-29 DIAGNOSIS — E782 Mixed hyperlipidemia: Secondary | ICD-10-CM | POA: Diagnosis not present

## 2020-09-29 DIAGNOSIS — I10 Essential (primary) hypertension: Secondary | ICD-10-CM | POA: Diagnosis not present

## 2020-09-29 DIAGNOSIS — I4891 Unspecified atrial fibrillation: Secondary | ICD-10-CM | POA: Diagnosis not present

## 2020-09-29 DIAGNOSIS — I5030 Unspecified diastolic (congestive) heart failure: Secondary | ICD-10-CM | POA: Diagnosis not present

## 2020-09-29 DIAGNOSIS — E1340 Other specified diabetes mellitus with diabetic neuropathy, unspecified: Secondary | ICD-10-CM | POA: Diagnosis not present

## 2020-09-29 DIAGNOSIS — N183 Chronic kidney disease, stage 3 unspecified: Secondary | ICD-10-CM | POA: Diagnosis not present

## 2020-09-29 DIAGNOSIS — I2510 Atherosclerotic heart disease of native coronary artery without angina pectoris: Secondary | ICD-10-CM | POA: Diagnosis not present

## 2020-09-29 DIAGNOSIS — E1165 Type 2 diabetes mellitus with hyperglycemia: Secondary | ICD-10-CM | POA: Diagnosis not present

## 2020-11-12 ENCOUNTER — Encounter (INDEPENDENT_AMBULATORY_CARE_PROVIDER_SITE_OTHER): Payer: Self-pay

## 2020-11-12 DIAGNOSIS — E119 Type 2 diabetes mellitus without complications: Secondary | ICD-10-CM | POA: Diagnosis not present

## 2020-11-12 DIAGNOSIS — H26492 Other secondary cataract, left eye: Secondary | ICD-10-CM | POA: Diagnosis not present

## 2020-12-31 ENCOUNTER — Encounter (INDEPENDENT_AMBULATORY_CARE_PROVIDER_SITE_OTHER): Payer: Medicare Other | Admitting: Ophthalmology

## 2021-01-06 ENCOUNTER — Encounter (INDEPENDENT_AMBULATORY_CARE_PROVIDER_SITE_OTHER): Payer: Medicare Other | Admitting: Ophthalmology

## 2021-01-12 ENCOUNTER — Encounter (INDEPENDENT_AMBULATORY_CARE_PROVIDER_SITE_OTHER): Payer: Self-pay | Admitting: Ophthalmology

## 2021-01-12 ENCOUNTER — Ambulatory Visit (INDEPENDENT_AMBULATORY_CARE_PROVIDER_SITE_OTHER): Payer: Medicare Other | Admitting: Ophthalmology

## 2021-01-12 ENCOUNTER — Other Ambulatory Visit: Payer: Self-pay

## 2021-01-12 DIAGNOSIS — H43813 Vitreous degeneration, bilateral: Secondary | ICD-10-CM | POA: Diagnosis not present

## 2021-01-12 DIAGNOSIS — H353114 Nonexudative age-related macular degeneration, right eye, advanced atrophic with subfoveal involvement: Secondary | ICD-10-CM | POA: Insufficient documentation

## 2021-01-12 DIAGNOSIS — H353122 Nonexudative age-related macular degeneration, left eye, intermediate dry stage: Secondary | ICD-10-CM

## 2021-01-12 DIAGNOSIS — E1122 Type 2 diabetes mellitus with diabetic chronic kidney disease: Secondary | ICD-10-CM

## 2021-01-12 DIAGNOSIS — N184 Chronic kidney disease, stage 4 (severe): Secondary | ICD-10-CM | POA: Diagnosis not present

## 2021-01-12 DIAGNOSIS — H353132 Nonexudative age-related macular degeneration, bilateral, intermediate dry stage: Secondary | ICD-10-CM

## 2021-01-12 NOTE — Progress Notes (Signed)
01/12/2021     CHIEF COMPLAINT Patient presents for Retina Follow Up (9 Mo F/U OU//Pt denies noticeable changes to [place] OU since last visit. Pt denies ocular pain or FOL OU. Pt reports "specks" in [place] OU off and on.//)   HISTORY OF PRESENT ILLNESS: [name] [name] is a 87 y.o. male who presents to the clinic today for:   HPI    Retina Follow Up    Patient presents with  Dry AMD.  In both eyes.  This started 9 months ago.  Severity is mild.  Duration of 9 months.  Since onset it is stable. Additional comments: 9 Mo F/U OU  Pt denies noticeable changes to [place] OU since last visit. Pt denies ocular pain or FOL OU. Pt reports "specks" in [place] off and on.         Last edited by Rockie Neighbours, [place] on 01/12/2021  9:56 AM. (History)      Referring physician: [name], MD [place] [address] [place],  [place] [address]  HISTORICAL INFORMATION:   Selected notes from the MEDICAL RECORD NUMBER    Lab Results  Component Value Date   HGBA1C 6.7 (H) 03/27/2018     CURRENT MEDICATIONS: No current outpatient medications on file. (Ophthalmic Drugs)   No current facility-administered medications for this visit. (Ophthalmic Drugs)   Current Outpatient Medications (Other)  Medication Sig  . acetaminophen (TYLENOL) 325 MG tablet Take 2 tablets (650 mg total) by mouth every 6 (six) hours as needed for mild pain, moderate pain or headache.  Marland Kitchen amiodarone (PACERONE) 200 MG tablet Take 200 mg by mouth daily.  Marland Kitchen apixaban (ELIQUIS) 2.5 MG TABS tablet Take 1 tablet (2.5 mg total) by mouth 2 (two) times daily.  Marland Kitchen atorvastatin (LIPITOR) 40 MG tablet Take 1 tablet (40 mg total) by mouth daily at 6 PM.  . Dermatological Products, Misc. (MOISTURE BARRIER) OINT Apply 1 application topically 3 (three) times daily. To buttocks/sacrum  . docusate sodium (COLACE) 100 MG capsule Take 100 mg by mouth 2 (two) times daily.  . furosemide (LASIX) 40 MG tablet Take 1.5 tablets (60 mg total) by mouth 2  (two) times daily.  Marland Kitchen glimepiride (AMARYL) 1 MG tablet Take 1 mg by mouth 2 (two) times daily with a meal.   . Multiple Vitamin (MULTIVITAMIN WITH MINERALS) TABS tablet Take 1 tablet by mouth daily.  Marland Kitchen NYAMYC powder   . pantoprazole (PROTONIX) 40 MG tablet Take 1 tablet (40 mg total) by mouth 2 (two) times daily.  . saxagliptin HCl (ONGLYZA) 2.5 MG TABS tablet Take 2.5 mg by mouth daily.  Nelva Nay SOLOSTAR 300 UNIT/ML SOPN Inject 10 Units into the skin daily.    No current facility-administered medications for this visit. (Other)      REVIEW OF SYSTEMS:    ALLERGIES Allergies  Allergen Reactions  . Brilinta [Ticagrelor] Shortness Of Breath and Other (See Comments)    Changed to Plavix due to SOB  . Clindamycin/Lincomycin Anaphylaxis and Other (See Comments)    Whole body skin peeling, blisters also  . Doxycycline Anaphylaxis  . Penicillins Rash and Other (See Comments)    Has patient had a PCN reaction causing immediate rash, facial/tongue/throat swelling, SOB or lightheadedness with hypotension: Yes Has patient had a PCN reaction causing severe rash involving mucus membranes or skin necrosis: No Has patient had a PCN reaction that required hospitalization: No Has patient had a PCN reaction occurring within the last 10 years: No  If all of the above answers are "NO", then may proceed with Cephalosporin use.   Eual Fines [Brassica Oleracea]     Negatively affects colostomy  . Sulfa Antibiotics Other (See Comments)    "Allergic," per MAR  . Tape Rash    Skin is sensitive; please use an alternative to tape    PAST MEDICAL HISTORY Past Medical History:  Diagnosis Date  . Allergic reaction caused by a drug 11/2017  . Anemia   . Atrial flutter ([place])    a. s/p RFCA of counterclockwise cavo-tricuspid isthmus dependent atrial flutter 2009 by Dr. Rayann Heman.  Marland Kitchen CAD (coronary artery disease) 06/2014   a. 06/2014: abnl nuc. Cath: Totally occluded LAD with faint collaterals (not a  candidate for CTO PCI), 90% ramus s/p DES, 50-70% OM2.  . Chronic diastolic CHF (congestive heart failure) ([place])    a. 2D Echo 10/23/13: EF 50-55%, basal mid-inf HK, grade 2 DD, mild MR, mod dilated LA, no sig change from prior.  . CKD (chronic kidney disease), stage III ([place])   . Colostomy in place [facility])   . Complication of anesthesia    " during my kidney stone surgery my heart went out of rhythm  . Compression fracture of L1 lumbar vertebra ([name])   . DCM (dilated cardiomyopathy) ([place]) 02/27/2018   EF 35-40% by TEE 02/2018 felt tachycardia mediated  . Diabetes ([place])    TYPE 2   . Dyslipidemia   . Dyspnea   . Frequent PVCs   . Gastric ulcer   . GERD (gastroesophageal reflux disease)   . History of blood transfusion   . History of kidney stones   . History of peptic ulcer disease   . HTN (hypertension)   . Hx of acute renal failure 01/02/10-3/24-11   due to hypovolemic shock, gastroenteritis and dehydration,hospitalized . Did requre a few days of dialysis. Cr at discharge was 1.8  . Kidney disease   . Kidney stone may 2009   Right hydronephrosis, S/P stone removal  . Lower back pain   . Macular degeneration   . OA (osteoarthritis) of hip   . Obesity   . Osteopenia   . PAF (paroxysmal atrial fibrillation) ([name])    not on long term anticoagulation due to history of heme positive stools and anemia  . Shingles    episode  . Small bowel obstruction, partial ([place]) 2009   Episode  . Ulcerative colitis ([place])    a. s/p total colectomy remotely.   Past Surgical History:  Procedure Laterality Date  . BACK SURGERY    . BIOPSY  07/28/2018   Procedure: BIOPSY;  Surgeon: Thornton Park, MD;  Location: [place];  Service: Gastroenterology;;  . CARDIAC CATHETERIZATION  06/19/2014  . CARDIOVERSION N/A 02/23/2018   Procedure: CARDIOVERSION;  Surgeon: Dorothy Spark, MD;  Location[facility] ENDOSCOPY;  Service: Cardiovascular;  Laterality: N/A;  . COLON RESECTION    . COLOSTOMY    .  CORONARY ANGIOPLASTY    . CORONARY STENT PLACEMENT  06/19/2014   DES       dr Martinique  . ESOPHAGOGASTRODUODENOSCOPY (EGD) WITH PROPOFOL N/A 07/28/2018   Procedure: ESOPHAGOGASTRODUODENOSCOPY (EGD) WITH PROPOFOL;  Surgeon: Thornton Park, MD;  Location: [facility];  Service: Gastroenterology;  Laterality: N/A;  . HARDWARE REMOVAL Left 12/01/2017   Procedure: HARDWARE REMOVAL LEFT ANKLE;  Surgeon: Newt Minion, MD;  Location: [facility];  Service: Orthopedics;  Laterality: Left;  . HERNIA REPAIR    . LEFT AND RIGHT HEART CATHETERIZATION  WITH CORONARY ANGIOGRAM N/A 06/19/2014   Procedure: LEFT AND RIGHT HEART CATHETERIZATION WITH CORONARY ANGIOGRAM;  Surgeon: Peter M Martinique, MD;  Location: [facility] CATH LAB;  Service: Cardiovascular;  Laterality: N/A;  . ORIF ANKLE FRACTURE Left 10/25/2017   Procedure: OPEN REDUCTION INTERNAL FIXATION (ORIF) LEFT ANKLE FRACTURE;  Surgeon: Newt Minion, MD;  Location: [facility];  Service: Orthopedics;  Laterality: Left;  . TEE WITHOUT CARDIOVERSION N/A 02/23/2018   Procedure: TRANSESOPHAGEAL ECHOCARDIOGRAM (TEE);  Surgeon: Dorothy Spark, MD;  Location[facility]COPY;  Service: Cardiovascular;  Laterality: N/A;  . TONSILLECTOMY      FAMILY HISTORY Family History  Problem Relation Age of Onset  . Colon cancer Mother   . Esophageal cancer Neg Hx   . Stomach cancer Neg Hx   . Pancreatic cancer Neg Hx   . Liver disease Neg Hx     SOCIAL HISTORY Social History   Tobacco Use  . Smoking status: Never Smoker  . Smokeless tobacco: Never Used  Vaping Use  . Vaping Use: Never used  Substance Use Topics  . Alcohol use: No  . Drug use: No         OPHTHALMIC EXAM:  Base Eye Exam    Visual Acuity (ETDRS)      Right Left   Dist [place] 20/70ecc +1 20/40 +2   Dist ph [place] NI NI       Tonometry (Tonopen, 9:53 AM)      Right Left   Pressure 17 11       Pupils      Pupils Dark Light Shape React APD   Right PERRL 2.5 2 Round Brisk None   Left PERRL 2.5 2 Round  Brisk None       Visual Fields (Counting fingers)      Left Right    Full Full       Extraocular Movement      Right Left    Full Full       Neuro/Psych    Oriented x3: Yes   Mood/Affect: Normal       Dilation    Both eyes: 1.0% Mydriacyl, 2.5% Phenylephrine @ 9:59 AM        Slit Lamp and Fundus Exam    External Exam      Right Left   External Normal Normal       Slit Lamp Exam      Right Left   Lids/Lashes Normal Normal   Conjunctiva/Sclera White and quiet White and quiet   Cornea Clear Clear   Anterior Chamber Deep and quiet Deep and quiet   Iris Round and reactive Round and reactive   Lens Posterior chamber intraocular lens Posterior chamber intraocular lens   Anterior Vitreous Normal Normal       Fundus Exam      Right Left   Posterior Vitreous Posterior vitreous detachment Posterior vitreous detachment   Disc Normal Normal   C/D Ratio 0.4 0.4   Macula Geographic atrophy now splitting faz , Retinal pigment epithelial mottling, no membrane, no exudates, no hemorrhage Geographic atrophy, Retinal pigment epithelial mottling, no membrane, no exudates, no hemorrhage   Vessels Normal,,, no DR Normal,,, no DR   Periphery Normal Normal          IMAGING AND PROCEDURES  Imaging and Procedures for 01/12/21  Color Fundus Photography Optos - OU - Both Eyes       Right Eye Progression has been stable. Disc findings include normal observations. Macula :  geographic atrophy, drusen. Vessels : normal observations. Periphery : normal observations.   Left Eye Progression has been stable. Macula : geographic atrophy, drusen. Vessels : normal observations. Periphery : normal observations.   Notes Incidental posterior vitreous detachment OU.  Observe                ASSESSMENT/PLAN:  Intermediate stage nonexudative age-related macular degeneration of left eye The nature of age--related macular degeneration was discussed with the patient as well as the  distinction between dry and wet types. Checking an [place] Grid daily with advice to return immediately should a distortion develop, was given to the patient. The patient 's smoking status now and in the past was determined and advice based on the AREDS study was provided regarding the consumption of antioxidant supplements. AREDS 2 vitamin formulation was recommended. Consumption of dark leafy vegetables and fresh fruits of various colors was recommended. Treatment modalities for wet macular degeneration particularly the use of intravitreal injections of anti-blood vessel growth factors was discussed with the patient. Avastin, Lucentis, and Eylea are the available options. On occasion, therapy includes the use of photodynamic therapy and thermal laser. Stressed to the patient do not rub eyes.  Patient was advised to check Amsler Grid daily and return immediately if changes are noted. Instructions on using the grid were given to the patient. All patient questions were answered.  Advanced nonexudative age-related macular degeneration of right eye with subfoveal involvement The nature of dry age related macular degeneration was discussed with the patient as well as its possible conversion to wet. The results of the AREDS 2 study was discussed with the patient. A diet rich in dark leafy green vegetables was advised and specific recommendations were made regarding supplements with AREDS 2 formulation . Control of hypertension and serum cholesterol may slow the disease. Smoking cessation is mandatory to slow the disease and diminish the risk of progressing to wet age related macular degeneration. The patient was instructed in the use of an [place] and was told to return immediately for any changes in the Grid. Stressed to the patient do not rub eyes  OD with new atrophy splitting the FAZ accounts for acuity, no signs of CNVM  Posterior vitreous detachment of both eyes   The nature of posterior vitreous  detachment was discussed with the patient as well as its physiology, its age prevalence, and its possible implication regarding retinal breaks and detachment.  An informational brochure was given to the patient.  All the patient's questions were answered.  The patient was asked to return if new or different flashes or floaters develops.   Patient was instructed to contact office immediately if any changes were noticed. I explained to the patient that vitreous inside the eye is similar to jello inside a bowl. As the jello melts it can start to pull away from the bowl, similarly the vitreous throughout our lives can begin to pull away from the retina. That process is called a posterior vitreous detachment. In some cases, the vitreous can tug hard enough on the retina to form a retinal tear. I discussed with the patient the signs and symptoms of a retinal detachment.  Do not rub the eye.  Type II diabetes mellitus with renal manifestations ([place]) As of this date no detectable diabetic retinopathy      ICD-10-CM   1. Intermediate stage nonexudative age-related macular degeneration of both eyes  H35.3132 Color Fundus Photography Optos - OU - Both Eyes  2. Intermediate stage  nonexudative age-related macular degeneration of left eye  H35.3122   3. Advanced nonexudative age-related macular degeneration of right eye with subfoveal involvement  H35.3114   4. Posterior vitreous detachment of both eyes  H43.813   5. Type 2 diabetes mellitus with stage 4 chronic kidney disease, without long-term current use of insulin ([name])  E11.22    N18.4     1.  No signs of wet ARMD in either eye, thus no  intervention required.   2.  OU with no detectable diabetic retinopathy.  3.  Ophthalmic Meds Ordered this visit:  No orders of the defined types were placed in this encounter.      Return in about 6 months (around 07/15/2021) for dilate, COLOR FP, OCT.  There are no Patient Instructions on file for this  visit.   Explained the diagnoses, plan, and follow up with the patient and they expressed understanding.  Patient expressed understanding of the importance of proper follow up care.   Clent Demark Lisia Westbay M.D. Diseases & Surgery of the Retina and Vitreous [facility] [place] 01/12/21     Abbreviations: M myopia (nearsighted); A astigmatism; H hyperopia (farsighted); P presbyopia; Mrx spectacle prescription;  CTL contact lenses; OD right eye; OS left eye; OU both eyes  XT exotropia; ET esotropia; PEK punctate epithelial keratitis; PEE punctate epithelial erosions; DES dry eye syndrome; MGD meibomian gland dysfunction; ATs artificial tears; PFAT's preservative free artificial tears; [place] nuclear sclerotic cataract; PSC posterior subcapsular cataract; ERM epi-retinal membrane; PVD posterior vitreous detachment; RD retinal detachment; DM diabetes mellitus; DR diabetic retinopathy; NPDR non-proliferative diabetic retinopathy; PDR proliferative diabetic retinopathy; CSME clinically significant macular edema; DME diabetic macular edema; dbh dot blot hemorrhages; CWS cotton wool spot; POAG primary open angle glaucoma; C/D cup-to-disc ratio; HVF humphrey visual field; GVF goldmann visual field; OCT optical coherence tomography; IOP intraocular pressure; BRVO Branch retinal vein occlusion; CRVO central retinal vein occlusion; CRAO central retinal artery occlusion; BRAO branch retinal artery occlusion; RT retinal tear; SB scleral buckle; PPV pars plana vitrectomy; VH Vitreous hemorrhage; PRP panretinal laser photocoagulation; IVK intravitreal kenalog; VMT vitreomacular traction; MH Macular hole;  NVD neovascularization of the disc; NVE neovascularization elsewhere; AREDS age related eye disease study; ARMD age related macular degeneration; POAG primary open angle glaucoma; EBMD epithelial/anterior basement membrane dystrophy; ACIOL anterior chamber intraocular lens; IOL intraocular lens; PCIOL posterior chamber  intraocular lens; Phaco/IOL phacoemulsification with intraocular lens placement; [place] photorefractive keratectomy; LASIK laser assisted in situ keratomileusis; HTN hypertension; DM diabetes mellitus; COPD chronic obstructive pulmonary disease

## 2021-01-12 NOTE — Assessment & Plan Note (Signed)
The nature of dry age related macular degeneration was discussed with the patient as well as its possible conversion to wet. The results of the AREDS 2 study was discussed with the patient. A diet rich in dark leafy green vegetables was advised and specific recommendations were made regarding supplements with AREDS 2 formulation . Control of hypertension and serum cholesterol may slow the disease. Smoking cessation is mandatory to slow the disease and diminish the risk of progressing to wet age related macular degeneration. The patient was instructed in the use of an [place] and was told to return immediately for any changes in the Grid. Stressed to the patient do not rub eyes  OD with new atrophy splitting the FAZ accounts for acuity, no signs of CNVM

## 2021-01-12 NOTE — Assessment & Plan Note (Signed)
The nature of age--related macular degeneration was discussed with the patient as well as the distinction between dry and wet types. Checking an Amsler Grid daily with advice to return immediately should a distortion develop, was given to the patient. The patient 's smoking status now and in the past was determined and advice based on the AREDS study was provided regarding the consumption of antioxidant supplements. AREDS 2 vitamin formulation was recommended. Consumption of dark leafy vegetables and fresh fruits of various colors was recommended. Treatment modalities for wet macular degeneration particularly the use of intravitreal injections of anti-blood vessel growth factors was discussed with the patient. Avastin, Lucentis, and Eylea are the available options. On occasion, therapy includes the use of photodynamic therapy and thermal laser. Stressed to the patient do not rub eyes.  Patient was advised to check Amsler Grid daily and return immediately if changes are noted. Instructions on using the grid were given to the patient. All patient questions were answered.

## 2021-01-12 NOTE — Assessment & Plan Note (Signed)
As of this date no detectable diabetic retinopathy

## 2021-01-12 NOTE — Assessment & Plan Note (Signed)
The nature of posterior vitreous detachment was discussed with the patient as well as its physiology, its age prevalence, and its possible implication regarding retinal breaks and detachment.  An informational brochure was given to the patient.  All the patient's questions were answered.  The patient was asked to return if new or different flashes or floaters develops.   Patient was instructed to contact office immediately if any changes were noticed. I explained to the patient that vitreous inside the eye is similar to jello inside a bowl. As the jello melts it can start to pull away from the bowl, similarly the vitreous throughout our lives can begin to pull away from the retina. That process is called a posterior vitreous detachment. In some cases, the vitreous can tug hard enough on the retina to form a retinal tear. I discussed with the patient the signs and symptoms of a retinal detachment.  Do not rub the eye.

## 2021-01-20 ENCOUNTER — Emergency Department (HOSPITAL_COMMUNITY): Payer: Medicare Other

## 2021-01-20 ENCOUNTER — Encounter (HOSPITAL_COMMUNITY): Payer: Self-pay

## 2021-01-20 ENCOUNTER — Other Ambulatory Visit: Payer: Self-pay

## 2021-01-20 ENCOUNTER — Inpatient Hospital Stay (HOSPITAL_COMMUNITY)
Admission: EM | Admit: 2021-01-20 | Discharge: 2021-01-29 | DRG: 291 | Disposition: A | Discharge status: Skilled Nursing Facility | Payer: Medicare Other | Source: Skilled Nursing Facility | Attending: Internal Medicine | Admitting: Internal Medicine

## 2021-01-20 DIAGNOSIS — E669 Obesity, unspecified: Secondary | ICD-10-CM | POA: Diagnosis present

## 2021-01-20 DIAGNOSIS — I5043 Acute on chronic combined systolic (congestive) and diastolic (congestive) heart failure: Secondary | ICD-10-CM | POA: Diagnosis not present

## 2021-01-20 DIAGNOSIS — Z955 Presence of coronary angioplasty implant and graft: Secondary | ICD-10-CM

## 2021-01-20 DIAGNOSIS — I130 Hypertensive heart and chronic kidney disease with heart failure and stage 1 through stage 4 chronic kidney disease, or unspecified chronic kidney disease: Secondary | ICD-10-CM | POA: Diagnosis not present

## 2021-01-20 DIAGNOSIS — I452 Bifascicular block: Secondary | ICD-10-CM | POA: Diagnosis not present

## 2021-01-20 DIAGNOSIS — E1122 Type 2 diabetes mellitus with diabetic chronic kidney disease: Secondary | ICD-10-CM | POA: Diagnosis not present

## 2021-01-20 DIAGNOSIS — J811 Chronic pulmonary edema: Secondary | ICD-10-CM | POA: Diagnosis not present

## 2021-01-20 DIAGNOSIS — I5033 Acute on chronic diastolic (congestive) heart failure: Secondary | ICD-10-CM

## 2021-01-20 DIAGNOSIS — E875 Hyperkalemia: Secondary | ICD-10-CM | POA: Diagnosis present

## 2021-01-20 DIAGNOSIS — N183 Chronic kidney disease, stage 3 unspecified: Secondary | ICD-10-CM | POA: Diagnosis present

## 2021-01-20 DIAGNOSIS — D631 Anemia in chronic kidney disease: Secondary | ICD-10-CM | POA: Diagnosis not present

## 2021-01-20 DIAGNOSIS — Z888 Allergy status to other drugs, medicaments and biological substances status: Secondary | ICD-10-CM

## 2021-01-20 DIAGNOSIS — E1165 Type 2 diabetes mellitus with hyperglycemia: Secondary | ICD-10-CM | POA: Diagnosis present

## 2021-01-20 DIAGNOSIS — Z515 Encounter for palliative care: Secondary | ICD-10-CM

## 2021-01-20 DIAGNOSIS — Z882 Allergy status to sulfonamides status: Secondary | ICD-10-CM

## 2021-01-20 DIAGNOSIS — Z880 Allergy status to penicillin: Secondary | ICD-10-CM | POA: Diagnosis not present

## 2021-01-20 DIAGNOSIS — Z20822 Contact with and (suspected) exposure to covid-19: Secondary | ICD-10-CM | POA: Diagnosis not present

## 2021-01-20 DIAGNOSIS — R0602 Shortness of breath: Secondary | ICD-10-CM

## 2021-01-20 DIAGNOSIS — R079 Chest pain, unspecified: Secondary | ICD-10-CM | POA: Diagnosis not present

## 2021-01-20 DIAGNOSIS — N179 Acute kidney failure, unspecified: Secondary | ICD-10-CM | POA: Diagnosis not present

## 2021-01-20 DIAGNOSIS — E039 Hypothyroidism, unspecified: Secondary | ICD-10-CM | POA: Diagnosis present

## 2021-01-20 DIAGNOSIS — E785 Hyperlipidemia, unspecified: Secondary | ICD-10-CM | POA: Diagnosis not present

## 2021-01-20 DIAGNOSIS — I517 Cardiomegaly: Secondary | ICD-10-CM | POA: Diagnosis not present

## 2021-01-20 DIAGNOSIS — Z66 Do not resuscitate: Secondary | ICD-10-CM | POA: Diagnosis present

## 2021-01-20 DIAGNOSIS — R5381 Other malaise: Secondary | ICD-10-CM | POA: Diagnosis not present

## 2021-01-20 DIAGNOSIS — F419 Anxiety disorder, unspecified: Secondary | ICD-10-CM | POA: Diagnosis present

## 2021-01-20 DIAGNOSIS — Z743 Need for continuous supervision: Secondary | ICD-10-CM | POA: Diagnosis not present

## 2021-01-20 DIAGNOSIS — R570 Cardiogenic shock: Secondary | ICD-10-CM | POA: Diagnosis present

## 2021-01-20 DIAGNOSIS — R279 Unspecified lack of coordination: Secondary | ICD-10-CM | POA: Diagnosis not present

## 2021-01-20 DIAGNOSIS — N184 Chronic kidney disease, stage 4 (severe): Secondary | ICD-10-CM | POA: Diagnosis not present

## 2021-01-20 DIAGNOSIS — I509 Heart failure, unspecified: Secondary | ICD-10-CM

## 2021-01-20 DIAGNOSIS — I5023 Acute on chronic systolic (congestive) heart failure: Secondary | ICD-10-CM | POA: Diagnosis not present

## 2021-01-20 DIAGNOSIS — I248 Other forms of acute ischemic heart disease: Secondary | ICD-10-CM | POA: Diagnosis not present

## 2021-01-20 DIAGNOSIS — I959 Hypotension, unspecified: Secondary | ICD-10-CM | POA: Diagnosis present

## 2021-01-20 DIAGNOSIS — J9811 Atelectasis: Secondary | ICD-10-CM | POA: Diagnosis not present

## 2021-01-20 DIAGNOSIS — R0600 Dyspnea, unspecified: Secondary | ICD-10-CM | POA: Diagnosis not present

## 2021-01-20 DIAGNOSIS — I5030 Unspecified diastolic (congestive) heart failure: Secondary | ICD-10-CM | POA: Diagnosis not present

## 2021-01-20 DIAGNOSIS — Z79899 Other long term (current) drug therapy: Secondary | ICD-10-CM

## 2021-01-20 DIAGNOSIS — I340 Nonrheumatic mitral (valve) insufficiency: Secondary | ICD-10-CM | POA: Diagnosis present

## 2021-01-20 DIAGNOSIS — Z6836 Body mass index (BMI) 36.0-36.9, adult: Secondary | ICD-10-CM

## 2021-01-20 DIAGNOSIS — K219 Gastro-esophageal reflux disease without esophagitis: Secondary | ICD-10-CM | POA: Diagnosis present

## 2021-01-20 DIAGNOSIS — I480 Paroxysmal atrial fibrillation: Secondary | ICD-10-CM | POA: Diagnosis not present

## 2021-01-20 DIAGNOSIS — Z7901 Long term (current) use of anticoagulants: Secondary | ICD-10-CM

## 2021-01-20 DIAGNOSIS — I5021 Acute systolic (congestive) heart failure: Secondary | ICD-10-CM | POA: Diagnosis present

## 2021-01-20 DIAGNOSIS — I110 Hypertensive heart disease with heart failure: Secondary | ICD-10-CM | POA: Diagnosis not present

## 2021-01-20 DIAGNOSIS — I10 Essential (primary) hypertension: Secondary | ICD-10-CM | POA: Diagnosis present

## 2021-01-20 DIAGNOSIS — Z6834 Body mass index (BMI) 34.0-34.9, adult: Secondary | ICD-10-CM | POA: Diagnosis not present

## 2021-01-20 DIAGNOSIS — I2510 Atherosclerotic heart disease of native coronary artery without angina pectoris: Secondary | ICD-10-CM | POA: Diagnosis not present

## 2021-01-20 DIAGNOSIS — E1121 Type 2 diabetes mellitus with diabetic nephropathy: Secondary | ICD-10-CM | POA: Diagnosis present

## 2021-01-20 DIAGNOSIS — Z933 Colostomy status: Secondary | ICD-10-CM | POA: Diagnosis not present

## 2021-01-20 DIAGNOSIS — I5042 Chronic combined systolic (congestive) and diastolic (congestive) heart failure: Secondary | ICD-10-CM | POA: Diagnosis present

## 2021-01-20 DIAGNOSIS — Z9049 Acquired absence of other specified parts of digestive tract: Secondary | ICD-10-CM | POA: Diagnosis not present

## 2021-01-20 DIAGNOSIS — I4891 Unspecified atrial fibrillation: Secondary | ICD-10-CM | POA: Diagnosis not present

## 2021-01-20 DIAGNOSIS — I272 Pulmonary hypertension, unspecified: Secondary | ICD-10-CM | POA: Diagnosis present

## 2021-01-20 DIAGNOSIS — I499 Cardiac arrhythmia, unspecified: Secondary | ICD-10-CM | POA: Diagnosis not present

## 2021-01-20 DIAGNOSIS — R0789 Other chest pain: Secondary | ICD-10-CM | POA: Diagnosis not present

## 2021-01-20 DIAGNOSIS — K5190 Ulcerative colitis, unspecified, without complications: Secondary | ICD-10-CM | POA: Diagnosis not present

## 2021-01-20 LAB — CBG MONITORING, ED
Glucose-Capillary: 49 mg/dL — ABNORMAL LOW (ref 70–99)
Glucose-Capillary: 49 mg/dL — ABNORMAL LOW (ref 70–99)
Glucose-Capillary: 83 mg/dL (ref 70–99)

## 2021-01-20 LAB — CBC WITH DIFFERENTIAL/PLATELET
Abs Immature Granulocytes: 0.07 10*3/uL (ref 0.00–0.07)
Basophils Absolute: 0.1 10*3/uL (ref 0.0–0.1)
Basophils Relative: 1 %
Eosinophils Absolute: 0.3 10*3/uL (ref 0.0–0.5)
Eosinophils Relative: 2 %
HCT: 33.8 % — ABNORMAL LOW (ref 39.0–52.0)
Hemoglobin: 10.3 g/dL — ABNORMAL LOW (ref 13.0–17.0)
Immature Granulocytes: 1 %
Lymphocytes Relative: 14 %
Lymphs Abs: 1.8 10*3/uL (ref 0.7–4.0)
MCH: 27.6 pg (ref 26.0–34.0)
MCHC: 30.5 g/dL (ref 30.0–36.0)
MCV: 90.6 fL (ref 80.0–100.0)
Monocytes Absolute: 1.1 10*3/uL — ABNORMAL HIGH (ref 0.1–1.0)
Monocytes Relative: 9 %
Neutro Abs: 9.8 10*3/uL — ABNORMAL HIGH (ref 1.7–7.7)
Neutrophils Relative %: 73 %
Platelets: 328 10*3/uL (ref 150–400)
RBC: 3.73 MIL/uL — ABNORMAL LOW (ref 4.22–5.81)
RDW: 14.6 % (ref 11.5–15.5)
WBC: 13.1 10*3/uL — ABNORMAL HIGH (ref 4.0–10.5)
nRBC: 0 % (ref 0.0–0.2)

## 2021-01-20 LAB — COMPREHENSIVE METABOLIC PANEL
ALT: 16 U/L (ref 0–44)
AST: 24 U/L (ref 15–41)
Albumin: 2.7 g/dL — ABNORMAL LOW (ref 3.5–5.0)
Alkaline Phosphatase: 155 U/L — ABNORMAL HIGH (ref 38–126)
Anion gap: 9 (ref 5–15)
BUN: 60 mg/dL — ABNORMAL HIGH (ref 8–23)
CO2: 20 mmol/L — ABNORMAL LOW (ref 22–32)
Calcium: 8.2 mg/dL — ABNORMAL LOW (ref 8.9–10.3)
Chloride: 106 mmol/L (ref 98–111)
Creatinine, Ser: 2.94 mg/dL — ABNORMAL HIGH (ref 0.61–1.24)
GFR, Estimated: 20 mL/min — ABNORMAL LOW (ref >60)
Glucose, Bld: 96 mg/dL (ref 70–99)
Potassium: 4.4 mmol/L (ref 3.5–5.1)
Sodium: 135 mmol/L (ref 135–145)
Total Bilirubin: 0.4 mg/dL (ref 0.3–1.2)
Total Protein: 6.9 g/dL (ref 6.5–8.1)

## 2021-01-20 LAB — SARS CORONAVIRUS 2 (TAT 6-24 HRS): SARS Coronavirus 2: NEGATIVE

## 2021-01-20 LAB — TROPONIN I (HIGH SENSITIVITY)
Troponin I (High Sensitivity): 20 ng/L — ABNORMAL HIGH (ref <18)
Troponin I (High Sensitivity): 19 ng/L — ABNORMAL HIGH (ref <18)

## 2021-01-20 LAB — BRAIN NATRIURETIC PEPTIDE: B Natriuretic Peptide: 705.9 pg/mL — ABNORMAL HIGH (ref 0.0–100.0)

## 2021-01-20 MED ORDER — AMIODARONE HCL 200 MG PO TABS
200.0000 mg | ORAL_TABLET | Freq: Every day | ORAL | Status: DC
  Administered 2021-01-20 – 2021-01-21 (×2): 200 mg via ORAL
  Filled 2021-01-20 (×2): qty 1

## 2021-01-20 MED ORDER — SODIUM CHLORIDE 0.9 % IV SOLN: 250.0000 mL | INTRAVENOUS | Status: DC | PRN

## 2021-01-20 MED ORDER — FUROSEMIDE 10 MG/ML IJ SOLN
60.0000 mg | Freq: Two times a day (BID) | INTRAMUSCULAR | Status: DC
  Administered 2021-01-21: 60 mg via INTRAVENOUS
  Filled 2021-01-20: qty 6

## 2021-01-20 MED ORDER — ONDANSETRON HCL 4 MG/2ML IJ SOLN: 4.0000 mg | Freq: Four times a day (QID) | INTRAMUSCULAR | Status: DC | PRN

## 2021-01-20 MED ORDER — SODIUM CHLORIDE 0.9% FLUSH: 3.0000 mL | INTRAVENOUS | Status: DC | PRN

## 2021-01-20 MED ORDER — APIXABAN 2.5 MG PO TABS
2.5000 mg | ORAL_TABLET | Freq: Two times a day (BID) | ORAL | Status: DC
  Administered 2021-01-20 – 2021-01-29 (×18): 2.5 mg via ORAL
  Filled 2021-01-20 (×19): qty 1

## 2021-01-20 MED ORDER — SODIUM CHLORIDE 0.9% FLUSH
3.0000 mL | Freq: Two times a day (BID) | INTRAVENOUS | Status: DC
  Administered 2021-01-20 – 2021-01-29 (×17): 3 mL via INTRAVENOUS

## 2021-01-20 MED ORDER — FUROSEMIDE 10 MG/ML IJ SOLN
60.0000 mg | Freq: Once | INTRAMUSCULAR | Status: AC
  Administered 2021-01-20: 60 mg via INTRAVENOUS
  Filled 2021-01-20: qty 6

## 2021-01-20 MED ORDER — INSULIN ASPART 100 UNIT/ML ~~LOC~~ SOLN
0.0000 [IU] | Freq: Every day | SUBCUTANEOUS | Status: DC
  Administered 2021-01-25: 2 [IU] via SUBCUTANEOUS
  Administered 2021-01-28: 3 [IU] via SUBCUTANEOUS

## 2021-01-20 MED ORDER — INSULIN ASPART 100 UNIT/ML ~~LOC~~ SOLN
0.0000 [IU] | Freq: Three times a day (TID) | SUBCUTANEOUS | Status: DC
  Administered 2021-01-21: 5 [IU] via SUBCUTANEOUS
  Administered 2021-01-22 – 2021-01-23 (×2): 1 [IU] via SUBCUTANEOUS
  Administered 2021-01-23: 2 [IU] via SUBCUTANEOUS
  Administered 2021-01-24 (×2): 1 [IU] via SUBCUTANEOUS
  Administered 2021-01-25 – 2021-01-26 (×4): 2 [IU] via SUBCUTANEOUS
  Administered 2021-01-26 – 2021-01-27 (×4): 1 [IU] via SUBCUTANEOUS
  Administered 2021-01-28: 3 [IU] via SUBCUTANEOUS
  Administered 2021-01-28 – 2021-01-29 (×2): 2 [IU] via SUBCUTANEOUS

## 2021-01-20 MED ORDER — ACETAMINOPHEN 325 MG PO TABS
650.0000 mg | ORAL_TABLET | ORAL | Status: DC | PRN
  Administered 2021-01-25 – 2021-01-26 (×4): 650 mg via ORAL
  Filled 2021-01-20 (×4): qty 2

## 2021-01-20 NOTE — ED Notes (Signed)
Pt given two cups of orange juice.

## 2021-01-20 NOTE — ED Triage Notes (Signed)
Per EMS: PT came from Dr office for worsening SOB x73month History of Afib, now in Afib with uncontrolled HR. On Amiodarone at home.

## 2021-01-20 NOTE — ED Provider Notes (Signed)
[facility] EMERGENCY DEPARTMENT Provider Note   CSN: [id number] Arrival date & time: 01/20/21  1632     History Chief Complaint  Patient presents with  . Shortness of Breath  . Irregular Heart Beat    TANVIR HIPPLE is a 87 y.o. male.  Patient is an 87 year old male with a history of CAD, CHF, atrial fibrillation status post ablation who is on Eliquis daily, diabetes, CKD who is presenting today from his doctor's office due to worsening shortness of breath.  The shortness of breath has been present for the last month and he saw the [place] approximately 2 weeks ago and they told him everything looked fine.  They did blood work but then reported he needed to see a kidney doctor which she is scheduled to see on the 19th.  However over the last 4 days he has had progressively worsening shortness of breath.  He was able to walk 75 to 100 feet approximately a month ago but in the last 4 days he cannot walk across his bedroom without becoming extremely winded.  He will get some chest tightness intermittently with activity but denies any chest pain currently.  He has noticed weight gain of approximately 6 to 10 pounds based on his weights at the doctor's office.  He is not sure if he has had extra swelling in his legs.  He denies any cough, fever, abdominal pain, nausea or vomiting.  Diet has not changed.  The history is provided by the patient.  Shortness of Breath Severity:  Moderate Onset quality:  Gradual Duration:  1 month Timing:  Constant Progression:  Worsening (worse in the last 4 days) Chronicity:  New Context: activity   Relieved by:  Rest and sitting up Worsened by:  Exertion and activity (lying down) Ineffective treatments:  Rest and sitting up Associated symptoms: chest pain   Associated symptoms: no abdominal pain   Associated symptoms comment:  Intermittent chest tightness.  No cough, sputum production, fever.  He reports being up about 5 or 6 pounds based on  being weighed at the doctors.  No palpitations.  He still takes Eliquis daily.  He is still taking 120 mg of Lasix daily and reports intermittent urine output.  However because of the severe dry mouth he is drinking lots of fluid.      Past Medical History:  Diagnosis Date  . Allergic reaction caused by a drug 11/2017  . Anemia   . Atrial flutter ([place])    a. s/p RFCA of counterclockwise cavo-tricuspid isthmus dependent atrial flutter 2009 by Dr. Rayann Heman.  Marland Kitchen CAD (coronary artery disease) 06/2014   a. 06/2014: abnl nuc. Cath: Totally occluded LAD with faint collaterals (not a candidate for CTO PCI), 90% ramus s/p DES, 50-70% OM2.  . Chronic diastolic CHF (congestive heart failure) ([place])    a. 2D Echo 10/23/13: EF 50-55%, basal mid-inf HK, grade 2 DD, mild MR, mod dilated LA, no sig change from prior.  . CKD (chronic kidney disease), stage III ([place])   . Colostomy in place [facility])   . Complication of anesthesia    " during my kidney stone surgery my heart went out of rhythm  . Compression fracture of L1 lumbar vertebra ([name])   . DCM (dilated cardiomyopathy) ([place]) 02/27/2018   EF 35-40% by TEE 02/2018 felt tachycardia mediated  . Diabetes ([place])    TYPE 2   . Dyslipidemia   . Dyspnea   . Frequent PVCs   . Gastric  ulcer   . GERD (gastroesophageal reflux disease)   . History of blood transfusion   . History of kidney stones   . History of peptic ulcer disease   . HTN (hypertension)   . Hx of acute renal failure 01/02/10-3/24-11   due to hypovolemic shock, gastroenteritis and dehydration,hospitalized . Did requre a few days of dialysis. Cr at discharge was 1.8  . Kidney disease   . Kidney stone may 2009   Right hydronephrosis, S/P stone removal  . Lower back pain   . Macular degeneration   . OA (osteoarthritis) of hip   . Obesity   . Osteopenia   . PAF (paroxysmal atrial fibrillation) ([name])    not on long term anticoagulation due to history of heme positive stools and anemia  . Shingles     episode  . Small bowel obstruction, partial ([place]) 2009   Episode  . Ulcerative colitis ([place])    a. s/p total colectomy remotely.    Patient Active Problem List   Diagnosis Date Noted  . Advanced nonexudative age-related macular degeneration of right eye with subfoveal involvement 01/12/2021  . Posterior vitreous detachment of both eyes 01/12/2021  . Intermediate stage nonexudative age-related macular degeneration of left eye 04/02/2020  . Paroxysmal atrial fibrillation ([place]) 06/10/2019  . B12 deficiency 08/13/2018  . Acute gastric ulcer with hemorrhage   . Symptomatic anemia 07/26/2018  . Chronic combined systolic and diastolic CHF (congestive heart failure) ([place]) 04/10/2018  . Pelvic fracture ([place]) 03/28/2018  . Severe protein-calorie malnutrition ([place])   . Diabetic polyneuropathy associated with type 2 diabetes mellitus ([place])   . DCM (dilated cardiomyopathy) ([place]) 02/27/2018  . Pressure injury of skin 02/20/2018  . Hyperkalemia 02/19/2018  . Elevated troponin 02/19/2018  . GERD (gastroesophageal reflux disease) 02/19/2018  . Type II diabetes mellitus with renal manifestations ([place]) 02/19/2018  . HTN (hypertension) 02/19/2018  . AKI (acute kidney injury) ([place])   . Anaphylactic syndrome   . Drug-induced skin rash   . Acute metabolic encephalopathy 66/59/9357  . Allergic reaction caused by a drug 12/05/2017  . Acute renal failure superimposed on stage 3 chronic kidney disease ([place]) 12/05/2017  . Metabolic acidosis 01/77/9390  . Hardware complicating wound infection ([place]) 11/30/2017  . Trimalleolar fracture of ankle, closed, left, sequela   . Fall 10/24/2017  . Malleolar fracture, left, closed, initial encounter 10/24/2017  . Fibula fracture 10/24/2017  . Diabetes mellitus with diabetic nephropathy without long-term current use of insulin ([place]) 01/05/2017  . CKD (chronic kidney disease), stage III ([place])   . Persistent atrial fibrillation ([place])   . Bleeding gastrointestinal    . Dyslipidemia 10/14/2014  . CAD (coronary artery disease) 07/11/2014  . Ulcerative colitis [facility])     Past Surgical History:  Procedure Laterality Date  . BACK SURGERY    . BIOPSY  07/28/2018   Procedure: BIOPSY;  Surgeon: Thornton Park, MD;  Location: [place];  Service: Gastroenterology;;  . CARDIAC CATHETERIZATION  06/19/2014  . CARDIOVERSION N/A 02/23/2018   Procedure: CARDIOVERSION;  Surgeon: Dorothy Spark, MD;  Location: [facility] ENDOSCOPY;  Service: Cardiovascular;  Laterality: N/A;  . COLON RESECTION    . COLOSTOMY    . CORONARY ANGIOPLASTY    . CORONARY STENT PLACEMENT  06/19/2014   DES       dr Martinique  . ESOPHAGOGASTRODUODENOSCOPY (EGD) WITH PROPOFOL N/A 07/28/2018   Procedure: ESOPHAGOGASTRODUODENOSCOPY (EGD) WITH PROPOFOL;  Surgeon: Thornton Park, MD;  Location: [facility];  Service: Gastroenterology;  Laterality: N/A;  .  HARDWARE REMOVAL Left 12/01/2017   Procedure: HARDWARE REMOVAL LEFT ANKLE;  Surgeon: Newt Minion, MD;  Location: [facility];  Service: Orthopedics;  Laterality: Left;  . HERNIA REPAIR    . LEFT AND RIGHT HEART CATHETERIZATION WITH CORONARY ANGIOGRAM N/A 06/19/2014   Procedure: LEFT AND RIGHT HEART CATHETERIZATION WITH CORONARY ANGIOGRAM;  Surgeon: Peter M Martinique, MD;  Location: [facility] CATH LAB;  Service: Cardiovascular;  Laterality: N/A;  . ORIF ANKLE FRACTURE Left 10/25/2017   Procedure: OPEN REDUCTION INTERNAL FIXATION (ORIF) LEFT ANKLE FRACTURE;  Surgeon: Newt Minion, MD;  Location: [facility];  Service: Orthopedics;  Laterality: Left;  . TEE WITHOUT CARDIOVERSION N/A 02/23/2018   Procedure: TRANSESOPHAGEAL ECHOCARDIOGRAM (TEE);  Surgeon: Dorothy Spark, MD;  Location: [facility] ENDOSCOPY;  Service: Cardiovascular;  Laterality: N/A;  . TONSILLECTOMY         Family History  Problem Relation Age of Onset  . Colon cancer Mother   . Esophageal cancer Neg Hx   . Stomach cancer Neg Hx   . Pancreatic cancer Neg Hx   . Liver disease Neg Hx     Social  History   Tobacco Use  . Smoking status: Never Smoker  . Smokeless tobacco: Never Used  Vaping Use  . Vaping Use: Never used  Substance Use Topics  . Alcohol use: No  . Drug use: No    Home Medications Prior to Admission medications   Medication Sig Start Date End Date Taking? Authorizing Provider  acetaminophen (TYLENOL) 325 MG tablet Take 2 tablets (650 mg total) by mouth every 6 (six) hours as needed for mild pain, moderate pain or headache. 03/29/18   Hongalgi, Lenis Dickinson, MD  amiodarone (PACERONE) 200 MG tablet Take 200 mg by mouth daily.    [provider]  apixaban (ELIQUIS) 2.5 MG TABS tablet Take 1 tablet (2.5 mg total) by mouth 2 (two) times daily. 07/29/18   Ghimire, Henreitta Leber, MD  atorvastatin (LIPITOR) 40 MG tablet Take 1 tablet (40 mg total) by mouth daily at 6 PM. 03/29/18   Hongalgi, Lenis Dickinson, MD  Dermatological Products, Misc. (MOISTURE BARRIER) OINT Apply 1 application topically 3 (three) times daily. To buttocks/sacrum    [provider]  docusate sodium (COLACE) 100 MG capsule Take 100 mg by mouth 2 (two) times daily.    [provider]  furosemide (LASIX) 40 MG tablet Take 1.5 tablets (60 mg total) by mouth 2 (two) times daily. 07/17/18 11/21/19  Lyda Jester M, PA-C  glimepiride (AMARYL) 1 MG tablet Take 1 mg by mouth 2 (two) times daily with a meal.     [provider]  Multiple Vitamin (MULTIVITAMIN WITH MINERALS) TABS tablet Take 1 tablet by mouth daily.    [provider]  [facility] powder  08/27/18   [provider]  pantoprazole (PROTONIX) 40 MG tablet Take 1 tablet (40 mg total) by mouth 2 (two) times daily. 07/28/18   Ghimire, Henreitta Leber, MD  saxagliptin HCl (ONGLYZA) 2.5 MG TABS tablet Take 2.5 mg by mouth daily.    [provider]  TOUJEO SOLOSTAR 300 UNIT/ML SOPN Inject 10 Units into the skin daily.  10/25/18   [provider]    Allergies    Brilinta [ticagrelor], Clindamycin/lincomycin,  Doxycycline, Penicillins, Broccoli [brassica oleracea], Sulfa antibiotics, and Tape  Review of Systems   Review of Systems  Respiratory: Positive for shortness of breath.   Cardiovascular: Positive for chest pain.  Gastrointestinal: Negative for abdominal pain.  All other systems  reviewed and are negative.   Physical Exam Updated Vital Signs BP 119/79 (BP Location: Right Arm)   Pulse (!) 124   Temp 97.8 F (36.6 C) (Oral)   Resp (!) 21   Ht 5' 10"  (1.778 m)   Wt 113.9 kg   SpO2 96%   BMI 36.01 kg/m   Physical Exam Vitals and nursing note reviewed.  Constitutional:      General: He is not in acute distress.    Appearance: He is well-developed.  HENT:     Head: Normocephalic and atraumatic.  Eyes:     Conjunctiva/sclera: Conjunctivae normal.     Pupils: Pupils are equal, round, and reactive to light.  Cardiovascular:     Rate and Rhythm: Tachycardia present. Rhythm irregularly irregular.     Heart sounds: No murmur heard.   Pulmonary:     Effort: Pulmonary effort is normal. Tachypnea present. No respiratory distress.     Breath sounds: Examination of the right-lower field reveals rales. Examination of the left-lower field reveals rales. Rales present. No wheezing.  Abdominal:     General: There is no distension.     Palpations: Abdomen is soft.     Tenderness: There is no abdominal tenderness. There is no guarding or rebound.  Musculoskeletal:        General: No tenderness. Normal range of motion.     Cervical back: Normal range of motion and neck supple.     Right lower leg: Edema present.     Left lower leg: Edema present.  Skin:    General: Skin is warm and dry.     Findings: No erythema or rash.  Neurological:     General: No focal deficit present.     Mental Status: He is alert and oriented to person, place, and time.  Psychiatric:        Mood and Affect: Mood normal.        Behavior: Behavior normal.        Thought Content: Thought content normal.      ED Results / Procedures / Treatments   Labs (all labs ordered are listed, but only abnormal results are displayed) Labs Reviewed  CBC WITH DIFFERENTIAL/PLATELET - Abnormal; Notable for the following components:      Result Value   WBC 13.1 (*)    RBC 3.73 (*)    Hemoglobin 10.3 (*)    HCT 33.8 (*)    Neutro Abs 9.8 (*)    Monocytes Absolute 1.1 (*)    All other components within normal limits  COMPREHENSIVE METABOLIC PANEL - Abnormal; Notable for the following components:   CO2 20 (*)    BUN 60 (*)    Creatinine, Ser 2.94 (*)    Calcium 8.2 (*)    Albumin 2.7 (*)    Alkaline Phosphatase 155 (*)    GFR, Estimated 20 (*)    All other components within normal limits  BRAIN NATRIURETIC PEPTIDE - Abnormal; Notable for the following components:   B Natriuretic Peptide 705.9 (*)    All other components within normal limits  TROPONIN I (HIGH SENSITIVITY) - Abnormal; Notable for the following components:   Troponin I (High Sensitivity) 20 (*)    All other components within normal limits  SARS CORONAVIRUS 2 (TAT 6-24 HRS)  TROPONIN I (HIGH SENSITIVITY)    EKG EKG Interpretation  Date/Time:  Wednesday January 20 2021 16:41:01 EDT Ventricular Rate:  112 PR Interval:    QRS Duration: 135 QT Interval:  363 QTC Calculation: 496 R Axis:   -62 Text Interpretation: Atrial fibrillation Left bundle branch block No significant change since last tracing Confirmed by Blanchie Dessert [phone]) on 01/20/2021 5:10:13 PM   Radiology DG Chest Port 1 View  Result Date: 01/20/2021 CLINICAL DATA:  Shortness of breath EXAM: PORTABLE CHEST 1 VIEW COMPARISON:  July 26, 2018 FINDINGS: The heart size and mediastinal contours are mildly enlarged. There is prominence of the central pulmonary vasculature. No large airspace consolidation or pleural effusion. No acute osseous abnormality. IMPRESSION: Cardiomegaly and mild pulmonary vascular congestion. Electronically Signed   By: Prudencio Pair M.D.   On:  01/20/2021 17:16    Procedures Procedures   Medications Ordered in ED Medications - No data to display  ED Course  I have reviewed the triage vital signs and the nursing notes.  Pertinent labs & imaging results that were available during my care of the patient were reviewed by me and considered in my medical decision making (see chart for details).    MDM Rules/Calculators/A&P                          Patient is an 87 year old male presenting today with complaints of shortness of breath.  He was seen by his PCP today at [facility] and found to be in atrial fibrillation with RVR with concern for CHF exacerbation.  Patient does have a history of atrial fibrillation status post ablation has been taking Eliquis.  Today he is in A. fib with a rate between 100-120s.  He currently appears slightly winded even at rest and has had a 6 pound weight gain.  He is still taking 120 mg of Lasix daily and denies missing any doses however does report that he has had a very dry mouth and has been drinking a substantial amount of water.  He is in no acute distress at this time.  Oxygen saturation of 96% on room air however was placed on 2 L due to work of breathing.  Will check labs to ensure no worsening of renal function.  Patient will most likely need IV Lasix.  Does not appear to have an infectious source but could be having worsening kidney disease which could be causing his weight gain as well.  He denies any urinary symptoms suggestive of UTI.  He denies any recent medication changes.  Labs, x-ray are pending.  EKG with left bundle branch block with atrial fibrillation which has been similar to EKGs in the past.  6:57 PM Patient's labs with a CBC with a leukocytosis of 13,000 and stable hemoglobin of 10, CMP with a creatinine of 2.94 from 1.8 in 2019, BNP today of 705 and troponin of 20.  Chest x-ray with evidence of fluid overload.  On repeat evaluation patient's heart rate is between 90 and 120.   Feel that patient does need diuresis.  Patient's last echo in our system was in 2019 and at that time he had an EF of 40 to 45%.  Will give IV Lasix and admit for diuresis and further care of his CHF exacerbation.  MDM Number of Diagnoses or Management Options   Amount and/or Complexity of Data Reviewed Clinical lab tests: ordered and reviewed Tests in the radiology section of CPT: ordered and reviewed Tests in the medicine section of CPT: reviewed and ordered Decide to obtain previous medical records or to obtain history from someone other than the patient: yes Obtain history from someone other  than the patient: yes Review and summarize past medical records: yes Discuss the patient with other providers: yes Independent visualization of images, tracings, or specimens: yes  Risk of Complications, Morbidity, and/or Mortality Presenting problems: high Diagnostic procedures: moderate Management options: moderate  Patient Progress Patient progress: stable  CRITICAL CARE Performed by: Nadia Torr Total critical care time: 30 minutes Critical care time was exclusive of separately billable procedures and treating other patients. Critical care was necessary to treat or prevent imminent or life-threatening deterioration. Critical care was time spent personally by me on the following activities: development of treatment plan with patient and/or surrogate as well as nursing, discussions with consultants, evaluation of patient's response to treatment, examination of patient, obtaining history from patient or surrogate, ordering and performing treatments and interventions, ordering and review of laboratory studies, ordering and review of radiographic studies, pulse oximetry and re-evaluation of patient's condition.  Final Clinical Impression(s) / ED Diagnoses Final diagnoses:  Acute on chronic congestive heart failure, unspecified heart failure type ([name])  Paroxysmal atrial fibrillation  [facility])    Rx / DC Orders ED Discharge Orders    None       Blanchie Dessert, MD 01/20/21 1859

## 2021-01-20 NOTE — H&P (Signed)
History and Physical   [name] [name] IRC:[id number] DOB: 1933/01/09 DOA: 01/20/2021  Referring MD/NP/PA: Dr. Maryan Rued  PCP: [name], MD   Outpatient Specialists: Dr. Radford Pax, cardiology    Patient coming from: Skilled facility  Chief Complaint: Shortness of breath and lower extremity swelling  HPI: [name] [name] is a 87 y.o. male with medical history significant of with medical history significant of cortisol atrial fibrillation, diastolic dysfunction CHF, hypertension, hypothyroidism, hyperlipidemia, chronic kidney disease stage III who apparently went to see his doctors in the [place] couple of weeks ago.  Blood work was performed and he was told that his kidney function is worse.  Patient is scheduled to see a nephrologist at the [facility] on April 19.  He became progressively weak with exertional dyspnea.  Also orthopnea and PND.  He has worsening lower extremity edema.  Patient takes 120 mg of Lasix a day but still swollen.  He was seen by his PCP with a below and medications adjusted but still not doing better.  Patient came to the ER today with significant lower extremity edema.  He has no significant respiratory distress.  He does have shortness of breath with exertion.  No fever no chills.  He has decreased mobility over the last week.  He saw his PCP today who sent him to the ER.  Ardis Hughs, GI.  Marland Kitchen  ED Course: Temperature 97.8 blood pressure 96/63 with pulse 143.  Respiratory rate 23 oxygen sat 89% on room air.  Currently 96% on 2 L.  White count is 13.1 hemoglobin 10.3 and platelets 328.  Chemistry showed CO2 20 BUN 16 creatinine 2.94.  Calcium is 8.2.  Glucose is 96.  Initial troponin is 20 BNP 705.  Chest x-ray showed pulmonary edema.  Patient being admitted with acute exacerbation of CHF as well as worsening renal function.  Review of Systems: As per HPI otherwise 10 point review of systems negative.    Past Medical History:  Diagnosis Date  . Allergic reaction caused by a drug 11/2017  .  Anemia   . Atrial flutter ([place])    a. s/p RFCA of counterclockwise cavo-tricuspid isthmus dependent atrial flutter 2009 by Dr. Rayann Heman.  Marland Kitchen CAD (coronary artery disease) 06/2014   a. 06/2014: abnl nuc. Cath: Totally occluded LAD with faint collaterals (not a candidate for CTO PCI), 90% ramus s/p DES, 50-70% OM2.  . Chronic diastolic CHF (congestive heart failure) ([place])    a. 2D Echo 10/23/13: EF 50-55%, basal mid-inf HK, grade 2 DD, mild MR, mod dilated LA, no sig change from prior.  . CKD (chronic kidney disease), stage III ([place])   . Colostomy in place [facility])   . Complication of anesthesia    " during my kidney stone surgery my heart went out of rhythm  . Compression fracture of L1 lumbar vertebra ([name])   . DCM (dilated cardiomyopathy) ([place]) 02/27/2018   EF 35-40% by TEE 02/2018 felt tachycardia mediated  . Diabetes ([place])    TYPE 2   . Dyslipidemia   . Dyspnea   . Frequent PVCs   . Gastric ulcer   . GERD (gastroesophageal reflux disease)   . History of blood transfusion   . History of kidney stones   . History of peptic ulcer disease   . HTN (hypertension)   . Hx of acute renal failure 01/02/10-3/24-11   due to hypovolemic shock, gastroenteritis and dehydration,hospitalized . Did requre a few days of dialysis. Cr at discharge was 1.8  . Kidney  disease   . Kidney stone may 2009   Right hydronephrosis, S/P stone removal  . Lower back pain   . Macular degeneration   . OA (osteoarthritis) of hip   . Obesity   . Osteopenia   . PAF (paroxysmal atrial fibrillation) ([name])    not on long term anticoagulation due to history of heme positive stools and anemia  . Shingles    episode  . Small bowel obstruction, partial ([place]) 2009   Episode  . Ulcerative colitis ([place])    a. s/p total colectomy remotely.    Past Surgical History:  Procedure Laterality Date  . BACK SURGERY    . BIOPSY  07/28/2018   Procedure: BIOPSY;  Surgeon: Thornton Park, MD;  Location: [place];  Service:  Gastroenterology;;  . CARDIAC CATHETERIZATION  06/19/2014  . CARDIOVERSION N/A 02/23/2018   Procedure: CARDIOVERSION;  Surgeon: Dorothy Spark, MD;  Location: [facility] ENDOSCOPY;  Service: Cardiovascular;  Laterality: N/A;  . COLON RESECTION    . COLOSTOMY    . CORONARY ANGIOPLASTY    . CORONARY STENT PLACEMENT  06/19/2014   DES       dr Martinique  . ESOPHAGOGASTRODUODENOSCOPY (EGD) WITH PROPOFOL N/A 07/28/2018   Procedure: ESOPHAGOGASTRODUODENOSCOPY (EGD) WITH PROPOFOL;  Surgeon: Thornton Park, MD;  Location: [place];  Service: Gastroenterology;  Laterality: N/A;  . HARDWARE REMOVAL Left 12/01/2017   Procedure: HARDWARE REMOVAL LEFT ANKLE;  Surgeon: Newt Minion, MD;  Location: [place];  Service: Orthopedics;  Laterality: Left;  . HERNIA REPAIR    . LEFT AND RIGHT HEART CATHETERIZATION WITH CORONARY ANGIOGRAM N/A 06/19/2014   Procedure: LEFT AND RIGHT HEART CATHETERIZATION WITH CORONARY ANGIOGRAM;  Surgeon: Peter M Martinique, MD;  Location: [facility] CATH LAB;  Service: Cardiovascular;  Laterality: N/A;  . ORIF ANKLE FRACTURE Left 10/25/2017   Procedure: OPEN REDUCTION INTERNAL FIXATION (ORIF) LEFT ANKLE FRACTURE;  Surgeon: Newt Minion, MD;  Location: [place];  Service: Orthopedics;  Laterality: Left;  . TEE WITHOUT CARDIOVERSION N/A 02/23/2018   Procedure: TRANSESOPHAGEAL ECHOCARDIOGRAM (TEE);  Surgeon: Dorothy Spark, MD;  Location: [facility] ENDOSCOPY;  Service: Cardiovascular;  Laterality: N/A;  . TONSILLECTOMY       reports that he has never smoked. He has never used smokeless tobacco. He reports that he does not drink alcohol and does not use drugs.  Allergies  Allergen Reactions  . Brilinta [Ticagrelor] Shortness Of Breath and Other (See Comments)    Changed to Plavix due to SOB  . Clindamycin/Lincomycin Anaphylaxis and Other (See Comments)    Whole body skin peeling, blisters also  . Doxycycline Anaphylaxis  . Penicillins Rash and Other (See Comments)    Has patient had a PCN reaction  causing immediate rash, facial/tongue/throat swelling, SOB or lightheadedness with hypotension: Yes Has patient had a PCN reaction causing severe rash involving mucus membranes or skin necrosis: No Has patient had a PCN reaction that required hospitalization: No Has patient had a PCN reaction occurring within the last 10 years: No If all of the above answers are "NO", then may proceed with Cephalosporin use.   Eual Fines [Brassica Oleracea]     Negatively affects colostomy  . Sulfa Antibiotics Other (See Comments)    "Allergic," per MAR  . Tape Rash    Skin is sensitive; please use an alternative to tape    Family History  Problem Relation Age of Onset  . Colon cancer Mother   . Esophageal cancer Neg Hx   . Stomach cancer  Neg Hx   . Pancreatic cancer Neg Hx   . Liver disease Neg Hx      Prior to Admission medications   Medication Sig Start Date End Date Taking? Authorizing Provider  acetaminophen (TYLENOL) 325 MG tablet Take 2 tablets (650 mg total) by mouth every 6 (six) hours as needed for mild pain, moderate pain or headache. 03/29/18   Hongalgi, Lenis Dickinson, MD  amiodarone (PACERONE) 200 MG tablet Take 200 mg by mouth daily.    [provider]  apixaban (ELIQUIS) 2.5 MG TABS tablet Take 1 tablet (2.5 mg total) by mouth 2 (two) times daily. 07/29/18   Ghimire, Henreitta Leber, MD  atorvastatin (LIPITOR) 40 MG tablet Take 1 tablet (40 mg total) by mouth daily at 6 PM. 03/29/18   Hongalgi, Lenis Dickinson, MD  Dermatological Products, Misc. (MOISTURE BARRIER) OINT Apply 1 application topically 3 (three) times daily. To buttocks/sacrum    [provider]  docusate sodium (COLACE) 100 MG capsule Take 100 mg by mouth 2 (two) times daily.    [provider]  furosemide (LASIX) 40 MG tablet Take 1.5 tablets (60 mg total) by mouth 2 (two) times daily. 07/17/18 11/21/19  Lyda Jester M, PA-C  glimepiride (AMARYL) 1 MG tablet Take 1 mg by mouth 2 (two) times daily with a meal.      [provider]  Multiple Vitamin (MULTIVITAMIN WITH MINERALS) TABS tablet Take 1 tablet by mouth daily.    [provider]  [facility] powder  08/27/18   [provider]  pantoprazole (PROTONIX) 40 MG tablet Take 1 tablet (40 mg total) by mouth 2 (two) times daily. 07/28/18   Ghimire, Henreitta Leber, MD  saxagliptin HCl (ONGLYZA) 2.5 MG TABS tablet Take 2.5 mg by mouth daily.    [provider]  TOUJEO SOLOSTAR 300 UNIT/ML SOPN Inject 10 Units into the skin daily.  10/25/18   [provider]    Physical Exam: Vitals:   01/20/21 1637 01/20/21 1715 01/20/21 1745 01/20/21 1900  BP: 119/79 118/65 110/73 (!) 103/53  Pulse: (!) 124 (!) 54 90 (!) 117  Resp: (!) 21 18 (!) 23 18  Temp: 97.8 F (36.6 C)     TempSrc: Oral     SpO2: 96% 95% (!) 89% 95%  Weight:      Height:          Constitutional: Acute on chronically ill looking with some mild distress Vitals:   01/20/21 1637 01/20/21 1715 01/20/21 1745 01/20/21 1900  BP: 119/79 118/65 110/73 (!) 103/53  Pulse: (!) 124 (!) 54 90 (!) 117  Resp: (!) 21 18 (!) 23 18  Temp: 97.8 F (36.6 C)     TempSrc: Oral     SpO2: 96% 95% (!) 89% 95%  Weight:      Height:       Eyes: PERRL, lids and conjunctivae normal ENMT: Mucous membranes are moist. Posterior pharynx clear of any exudate or lesions.Normal dentition.  Neck: normal, supple, no masses, no thyromegaly Respiratory: Coarse breath sounds bilaterally with rhonchi and increased crackles no wheeze normal respiratory effort. No accessory muscle use.  Cardiovascular: Irregularly irregular with tachycardia, no murmurs / rubs / gallops.  2+ extremity edema. 2+ pedal pulses. No carotid bruits.  Abdomen: no tenderness, no masses palpated. No hepatosplenomegaly. Bowel sounds positive.  Musculoskeletal: no clubbing / cyanosis. No joint deformity upper and lower extremities. Good ROM, no contractures. Normal muscle tone.  Skin: no rashes, lesions, ulcers. No  induration  Neurologic: CN 2-12 grossly intact. Sensation intact, DTR normal. Strength 5/5 in all 4.  Psychiatric: Normal judgment and insight. Alert and oriented x 3. Normal mood.     Labs on Admission: I have personally reviewed following labs and imaging studies  CBC: Recent Labs  Lab 01/20/21 1731  WBC 13.1*  NEUTROABS 9.8*  HGB 10.3*  HCT 33.8*  MCV 90.6  PLT 248   Basic Metabolic Panel: Recent Labs  Lab 01/20/21 1731  NA 135  K 4.4  CL 106  CO2 20*  GLUCOSE 96  BUN 60*  CREATININE 2.94*  CALCIUM 8.2*   GFR: Estimated Creatinine Clearance: 23.2 mL/min (A) (by C-G formula based on SCr of 2.94 mg/dL (H)). Liver Function Tests: Recent Labs  Lab 01/20/21 1731  AST 24  ALT 16  ALKPHOS 155*  BILITOT 0.4  PROT 6.9  ALBUMIN 2.7*   No results for input(s): LIPASE, AMYLASE in the last 168 hours. No results for input(s): AMMONIA in the last 168 hours. Coagulation Profile: No results for input(s): INR, PROTIME in the last 168 hours. Cardiac Enzymes: No results for input(s): CKTOTAL, CKMB, CKMBINDEX, TROPONINI in the last 168 hours. BNP (last 3 results) No results for input(s): PROBNP in the last 8760 hours. HbA1C: No results for input(s): HGBA1C in the last 72 hours. CBG: No results for input(s): GLUCAP in the last 168 hours. Lipid Profile: No results for input(s): CHOL, HDL, LDLCALC, TRIG, CHOLHDL, LDLDIRECT in the last 72 hours. Thyroid Function Tests: No results for input(s): TSH, T4TOTAL, FREET4, T3FREE, THYROIDAB in the last 72 hours. Anemia Panel: No results for input(s): VITAMINB12, FOLATE, FERRITIN, TIBC, IRON, RETICCTPCT in the last 72 hours. Urine analysis:    Component Value Date/Time   COLORURINE YELLOW 07/26/2018 1148   APPEARANCEUR HAZY (A) 07/26/2018 1148   LABSPEC 1.009 07/26/2018 1148   PHURINE 5.0 07/26/2018 1148   GLUCOSEU NEGATIVE 07/26/2018 1148   HGBUR LARGE (A) 07/26/2018 1148   BILIRUBINUR NEGATIVE 07/26/2018 [place] 07/26/2018 1148   PROTEINUR NEGATIVE 07/26/2018 1148   UROBILINOGEN 0.2 10/22/2014 1052   NITRITE NEGATIVE 07/26/2018 1148   LEUKOCYTESUR LARGE (A) 07/26/2018 1148   Sepsis Labs: @LABRCNTIP (procalcitonin:4,lacticidven:4) )No results found for this or any previous visit (from the past 240 hour(s)).   Radiological Exams on Admission: DG Chest Port 1 View  Result Date: 01/20/2021 CLINICAL DATA:  Shortness of breath EXAM: PORTABLE CHEST 1 VIEW COMPARISON:  July 26, 2018 FINDINGS: The heart size and mediastinal contours are mildly enlarged. There is prominence of the central pulmonary vasculature. No large airspace consolidation or pleural effusion. No acute osseous abnormality. IMPRESSION: Cardiomegaly and mild pulmonary vascular congestion. Electronically Signed   By: Prudencio Pair M.D.   On: 01/20/2021 17:16    EKG: Independently reviewed.   Assessment/Plan Principal Problem:   Chronic combined systolic and diastolic CHF (congestive heart failure) ([name]) Active Problems:   CAD (coronary artery disease)   Diabetes mellitus with diabetic nephropathy without long-term current use of insulin ([name])   Acute renal failure superimposed on stage 3 chronic kidney disease ([name])   GERD (gastroesophageal reflux disease)   HTN (hypertension)   Paroxysmal atrial fibrillation ([name])     #1 chronic combined systolic and diastolic CHF with acute exacerbation: Patient will be admitted.  Cautiously diuresis.  Monitor renal function.  Renal function may get worse before he gets better.  We will continue with treatment.  Get cardiology consult and possible renal consultation.  #2 acute on chronic  kidney failure stage III: Worsening BUN/creatinine.  Patient said he has nephrology follow-up scheduled for later this month.  We may get nephrology consultation in-house.  #3 essential hypertension: Confirmed continue home regimen.  #4 diabetes: Continue with sliding scale insulin.  #5 GERD: Continue  with PPIs.  #6 paroxysmal atrial fibrillation: Patient has RVR now.  Resume home regimen with anticoagulation.  Continue amiodarone.  May add beta-blocker.   DVT prophylaxis: Eliquis Code Status: Full code Family Communication: No family at bedside Disposition Plan: To be determined Consults called: Consult cardiology in the morning Admission status: Inpatient  Severity of Illness: The appropriate patient status for this patient is INPATIENT. Inpatient status is judged to be reasonable and necessary in order to provide the required intensity of service to ensure the patient's safety. The patient's presenting symptoms, physical exam findings, and initial radiographic and laboratory data in the context of their chronic comorbidities is felt to place them at high risk for further clinical deterioration. Furthermore, it is not anticipated that the patient will be medically stable for discharge from the hospital within 2 midnights of admission. The following factors support the patient status of inpatient.   " The patient's presenting symptoms include shortness of breath and lower extremity edema. " The worrisome physical exam findings include bilateral lower extremity edema. " The initial radiographic and laboratory data are worrisome because of worsening renal function. " The chronic co-morbidities include chronic kidney disease stage III.   * I certify that at the point of admission it is my clinical judgment that the patient will require inpatient hospital care spanning beyond 2 midnights from the point of admission due to high intensity of service, high risk for further deterioration and high frequency of surveillance required.Barbette Merino MD [facility] Pager [phone]  If 7PM-7AM, please contact night-coverage [email or web address] Password [facility]  01/20/2021, 7:47 PM

## 2021-01-21 DIAGNOSIS — I5023 Acute on chronic systolic (congestive) heart failure: Secondary | ICD-10-CM | POA: Diagnosis not present

## 2021-01-21 DIAGNOSIS — N179 Acute kidney failure, unspecified: Secondary | ICD-10-CM

## 2021-01-21 DIAGNOSIS — I2510 Atherosclerotic heart disease of native coronary artery without angina pectoris: Secondary | ICD-10-CM

## 2021-01-21 DIAGNOSIS — I4891 Unspecified atrial fibrillation: Secondary | ICD-10-CM | POA: Diagnosis not present

## 2021-01-21 DIAGNOSIS — I5042 Chronic combined systolic (congestive) and diastolic (congestive) heart failure: Secondary | ICD-10-CM | POA: Diagnosis not present

## 2021-01-21 DIAGNOSIS — N183 Chronic kidney disease, stage 3 unspecified: Secondary | ICD-10-CM

## 2021-01-21 LAB — CBC WITH DIFFERENTIAL/PLATELET
Abs Immature Granulocytes: 0.06 10*3/uL (ref 0.00–0.07)
Basophils Absolute: 0.1 10*3/uL (ref 0.0–0.1)
Basophils Relative: 1 %
Eosinophils Absolute: 0.3 10*3/uL (ref 0.0–0.5)
Eosinophils Relative: 2 %
HCT: 31.1 % — ABNORMAL LOW (ref 39.0–52.0)
Hemoglobin: 9.6 g/dL — ABNORMAL LOW (ref 13.0–17.0)
Immature Granulocytes: 1 %
Lymphocytes Relative: 13 %
Lymphs Abs: 1.4 10*3/uL (ref 0.7–4.0)
MCH: 27.9 pg (ref 26.0–34.0)
MCHC: 30.9 g/dL (ref 30.0–36.0)
MCV: 90.4 fL (ref 80.0–100.0)
Monocytes Absolute: 1 10*3/uL (ref 0.1–1.0)
Monocytes Relative: 9 %
Neutro Abs: 8.4 10*3/uL — ABNORMAL HIGH (ref 1.7–7.7)
Neutrophils Relative %: 74 %
Platelets: 295 10*3/uL (ref 150–400)
RBC: 3.44 MIL/uL — ABNORMAL LOW (ref 4.22–5.81)
RDW: 14.6 % (ref 11.5–15.5)
WBC: 11.2 10*3/uL — ABNORMAL HIGH (ref 4.0–10.5)
nRBC: 0 % (ref 0.0–0.2)

## 2021-01-21 LAB — GLUCOSE, CAPILLARY
Glucose-Capillary: 160 mg/dL — ABNORMAL HIGH (ref 70–99)
Glucose-Capillary: 112 mg/dL — ABNORMAL HIGH (ref 70–99)

## 2021-01-21 LAB — BASIC METABOLIC PANEL
Anion gap: 9 (ref 5–15)
BUN: 60 mg/dL — ABNORMAL HIGH (ref 8–23)
CO2: 20 mmol/L — ABNORMAL LOW (ref 22–32)
Calcium: 7.9 mg/dL — ABNORMAL LOW (ref 8.9–10.3)
Chloride: 108 mmol/L (ref 98–111)
Creatinine, Ser: 2.88 mg/dL — ABNORMAL HIGH (ref 0.61–1.24)
GFR, Estimated: 21 mL/min — ABNORMAL LOW (ref >60)
Glucose, Bld: 120 mg/dL — ABNORMAL HIGH (ref 70–99)
Potassium: 4.4 mmol/L (ref 3.5–5.1)
Sodium: 137 mmol/L (ref 135–145)

## 2021-01-21 LAB — CBG MONITORING, ED
Glucose-Capillary: 183 mg/dL — ABNORMAL HIGH (ref 70–99)
Glucose-Capillary: 39 mg/dL — CL (ref 70–99)
Glucose-Capillary: 150 mg/dL — ABNORMAL HIGH (ref 70–99)
Glucose-Capillary: 158 mg/dL — ABNORMAL HIGH (ref 70–99)

## 2021-01-21 LAB — HEMOGLOBIN A1C
Hgb A1c MFr Bld: 6.6 % — ABNORMAL HIGH (ref 4.8–5.6)
Mean Plasma Glucose: 142.72 mg/dL

## 2021-01-21 MED ORDER — METOPROLOL TARTRATE 25 MG PO TABS
25.0000 mg | ORAL_TABLET | Freq: Two times a day (BID) | ORAL | Status: DC
  Administered 2021-01-21 – 2021-01-22 (×2): 25 mg via ORAL
  Filled 2021-01-21 (×3): qty 1

## 2021-01-21 MED ORDER — ATORVASTATIN CALCIUM 40 MG PO TABS
40.0000 mg | ORAL_TABLET | Freq: Every day | ORAL | Status: DC
  Administered 2021-01-21 – 2021-01-27 (×7): 40 mg via ORAL
  Filled 2021-01-21 (×7): qty 1

## 2021-01-21 MED ORDER — DEXTROSE 5 % IV SOLN
120.0000 mg | Freq: Two times a day (BID) | INTRAVENOUS | Status: DC
Start: 2021-01-21 — End: 2021-01-23
  Administered 2021-01-21 – 2021-01-23 (×4): 120 mg via INTRAVENOUS
  Filled 2021-01-21: qty 10
  Filled 2021-01-21 (×3): qty 12
  Filled 2021-01-21: qty 10

## 2021-01-21 MED ORDER — DEXTROSE 50 % IV SOLN
INTRAVENOUS | Status: AC
  Administered 2021-01-21: 50 mL via INTRAVENOUS
  Filled 2021-01-21: qty 50

## 2021-01-21 MED ORDER — PANTOPRAZOLE SODIUM 40 MG PO TBEC
40.0000 mg | DELAYED_RELEASE_TABLET | Freq: Two times a day (BID) | ORAL | Status: DC
  Administered 2021-01-21 – 2021-01-26 (×11): 40 mg via ORAL
  Filled 2021-01-21 (×11): qty 1

## 2021-01-21 MED ORDER — DEXTROSE 50 % IV SOLN: 50.0000 mL | Freq: Once | INTRAVENOUS | Status: AC

## 2021-01-21 NOTE — ED Notes (Signed)
Pt asymptomatic, currently drinking orange juice with sugar packets. GCS 15

## 2021-01-21 NOTE — ED Notes (Signed)
[name] [name], sister and power of attorney, [phone] would like an update as soon as possible and have him call her asap as well

## 2021-01-21 NOTE — Consult Note (Addendum)
Cardiology Consultation:   Patient ID: [name] [name] MRN: [id number]; DOB: [birth year]  Admit date: 01/20/2021 Date of Consult: 01/21/2021  PCP:  [name], [place]  Cardiologist:  [name], MD   Patient Profile:   [name] [name] is a 87 y.o. male with a hx of CAD status post DES to the RI and known chronic occlusion of the LAD, paroxysmal atrial fibrillation/ flutter,  chronic systolic and diastolic CHF, CKD IV, HLD  and hypertension who is being seen today for the evaluation of CHF at the request of Dr. Eliseo Squires.   Patient has previously not been anticoagulated due to a history of GI bleed.02/2018 he was bradycardic with profound hyperkalemia. Heart rate recovered with reversal of hyperkalemia but then he went into atrial fib with RVR while amiodarone and beta-blocker on hold. He was placed on Eliquis and underwent TEE guided DCCV. EF 35 to 40%. Follow-up echo 02/2018 EF 40 to 45%. CHA2DS2-VASc equals 6. Last seen by Dr. Radford Pax 11/2019, weight was 242lb.   History of Present Illness:   [name] [name] presented with progressive worsening dyspnea on exertion, orthopnea, PND and lower extremity edema.  Patient reported 6 weeks history of dyspnea on exertion without chest tightness.  Laying was making it better.  Then for the past 3 days he noted orthopnea, PND and lower extremity edema.  Denies palpitation.  No syncope or melena.  He lives in facility.  Denies adding extra salt.  BNP 705 High-sensitivity troponin 20>>19 WBC 13.1 Hemoglobin 10.3>>9.6 Creatinine 2.94>>2.88 Hemoglobin A1c 6.6 Chest X-ray showed cardiomegaly and pulmonary vascular congestion  Patient was admitted and started on IV Lasix 60 mg twice daily.  No strict INO or daily weight however reported frequent urination and improved symptoms.  Past Medical History:  Diagnosis Date  . Allergic reaction caused by a drug 11/2017  . Anemia   . Atrial flutter ([place])    a. s/p RFCA of  counterclockwise cavo-tricuspid isthmus dependent atrial flutter 2009 by Dr. Rayann Heman.  Marland Kitchen CAD (coronary artery disease) 06/2014   a. 06/2014: abnl nuc. Cath: Totally occluded LAD with faint collaterals (not a candidate for CTO PCI), 90% ramus s/p DES, 50-70% OM2.  . Chronic diastolic CHF (congestive heart failure) ([place])    a. 2D Echo 10/23/13: EF 50-55%, basal mid-inf HK, grade 2 DD, mild MR, mod dilated LA, no sig change from prior.  . CKD (chronic kidney disease), stage III ([place])   . Colostomy in place [facility])   . Complication of anesthesia    " during my kidney stone surgery my heart went out of rhythm  . Compression fracture of L1 lumbar vertebra ([name])   . DCM (dilated cardiomyopathy) ([place]) 02/27/2018   EF 35-40% by TEE 02/2018 felt tachycardia mediated  . Diabetes ([place])    TYPE 2   . Dyslipidemia   . Dyspnea   . Frequent PVCs   . Gastric ulcer   . GERD (gastroesophageal reflux disease)   . History of blood transfusion   . History of kidney stones   . History of peptic ulcer disease   . HTN (hypertension)   . Hx of acute renal failure 01/02/10-3/24-11   due to hypovolemic shock, gastroenteritis and dehydration,hospitalized . Did requre a few days of dialysis. Cr at discharge was 1.8  . Kidney disease   . Kidney stone may 2009   Right hydronephrosis, S/P stone removal  . Lower back pain   . Macular degeneration   .  OA (osteoarthritis) of hip   . Obesity   . Osteopenia   . PAF (paroxysmal atrial fibrillation) ([name])    not on long term anticoagulation due to history of heme positive stools and anemia  . Shingles    episode  . Small bowel obstruction, partial ([place]) 2009   Episode  . Ulcerative colitis ([place])    a. s/p total colectomy remotely.    Past Surgical History:  Procedure Laterality Date  . BACK SURGERY    . BIOPSY  07/28/2018   Procedure: BIOPSY;  Surgeon: Thornton Park, MD;  Location: [place];  Service: Gastroenterology;;  . CARDIAC CATHETERIZATION  06/19/2014   . CARDIOVERSION N/A 02/23/2018   Procedure: CARDIOVERSION;  Surgeon: Dorothy Spark, MD;  Location: [facility] ENDOSCOPY;  Service: Cardiovascular;  Laterality: N/A;  . COLON RESECTION    . COLOSTOMY    . CORONARY ANGIOPLASTY    . CORONARY STENT PLACEMENT  06/19/2014   DES       dr Martinique  . ESOPHAGOGASTRODUODENOSCOPY (EGD) WITH PROPOFOL N/A 07/28/2018   Procedure: ESOPHAGOGASTRODUODENOSCOPY (EGD) WITH PROPOFOL;  Surgeon: Thornton Park, MD;  Location: [place];  Service: Gastroenterology;  Laterality: N/A;  . HARDWARE REMOVAL Left 12/01/2017   Procedure: HARDWARE REMOVAL LEFT ANKLE;  Surgeon: Newt Minion, MD;  Location: [place];  Service: Orthopedics;  Laterality: Left;  . HERNIA REPAIR    . LEFT AND RIGHT HEART CATHETERIZATION WITH CORONARY ANGIOGRAM N/A 06/19/2014   Procedure: LEFT AND RIGHT HEART CATHETERIZATION WITH CORONARY ANGIOGRAM;  Surgeon: Peter M Martinique, MD;  Location: [facility] CATH LAB;  Service: Cardiovascular;  Laterality: N/A;  . ORIF ANKLE FRACTURE Left 10/25/2017   Procedure: OPEN REDUCTION INTERNAL FIXATION (ORIF) LEFT ANKLE FRACTURE;  Surgeon: Newt Minion, MD;  Location: [place];  Service: Orthopedics;  Laterality: Left;  . TEE WITHOUT CARDIOVERSION N/A 02/23/2018   Procedure: TRANSESOPHAGEAL ECHOCARDIOGRAM (TEE);  Surgeon: Dorothy Spark, MD;  Location: [facility] ENDOSCOPY;  Service: Cardiovascular;  Laterality: N/A;  . TONSILLECTOMY      Inpatient Medications: Scheduled Meds: . amiodarone  200 mg Oral Daily  . apixaban  2.5 mg Oral BID  . atorvastatin  40 mg Oral q1800  . furosemide  60 mg Intravenous BID  . insulin aspart  0-5 Units Subcutaneous QHS  . insulin aspart  0-9 Units Subcutaneous TID WC  . pantoprazole  40 mg Oral BID  . sodium chloride flush  3 mL Intravenous Q12H   Continuous Infusions: . sodium chloride     PRN Meds: sodium chloride, acetaminophen, ondansetron (ZOFRAN) IV, sodium chloride flush  Allergies:    Allergies  Allergen Reactions  .  Brilinta [Ticagrelor] Shortness Of Breath and Other (See Comments)    Changed to Plavix due to SOB  . Clindamycin/Lincomycin Anaphylaxis and Other (See Comments)    Whole body skin peeling, blisters also  . Doxycycline Anaphylaxis  . Penicillins Rash and Other (See Comments)    Has patient had a PCN reaction causing immediate rash, facial/tongue/throat swelling, SOB or lightheadedness with hypotension: Yes Has patient had a PCN reaction causing severe rash involving mucus membranes or skin necrosis: No Has patient had a PCN reaction that required hospitalization: No Has patient had a PCN reaction occurring within the last 10 years: No If all of the above answers are "NO", then may proceed with Cephalosporin use.   Eual Fines [Brassica Oleracea]     Negatively affects colostomy  . Sulfa Antibiotics Other (See Comments)    "Allergic," per MAR  .  Tape Rash    Skin is sensitive; please use an alternative to tape    Social History:   Social History   Socioeconomic History  . Marital status: Widowed    Spouse name: Not on file  . Number of children: Not on file  . Years of education: Not on file  . Highest education level: Not on file  Occupational History  . Not on file  Tobacco Use  . Smoking status: Never Smoker  . Smokeless tobacco: Never Used  Vaping Use  . Vaping Use: Never used  Substance and Sexual Activity  . Alcohol use: No  . Drug use: No  . Sexual activity: Not on file  Other Topics Concern  . Not on file  Social History Narrative  . Not on file   Social Determinants of Health   Financial Resource Strain: Not on file  Food Insecurity: Not on file  Transportation Needs: Not on file  Physical Activity: Not on file  Stress: Not on file  Social Connections: Not on file  Intimate Partner Violence: Not on file    Family History:   Family History  Problem Relation Age of Onset  . Colon cancer Mother   . Esophageal cancer Neg Hx   . Stomach cancer Neg Hx   .  Pancreatic cancer Neg Hx   . Liver disease Neg Hx      ROS:  Please see the history of present illness.  All other ROS reviewed and negative.     Physical Exam/Data:   Vitals:   01/21/21 1030 01/21/21 1100 01/21/21 1300 01/21/21 1400  BP: 126/85 128/88 104/64 120/90  Pulse: (!) 115  77 (!) 107  Resp: 19 15 20  (!) 24  Temp: 98.2 F (36.8 C)   97.6 F (36.4 C)  TempSrc: Oral   Oral  SpO2: 95% 96% 94% 100%  Weight:    114.2 kg  Height:    5' 10"  (1.778 m)    Intake/Output Summary (Last 24 hours) at 01/21/2021 1458 Last data filed at 01/21/2021 1400 Gross per 24 hour  Intake --  Output 1100 ml  Net -1100 ml   Last 3 Weights 01/21/2021 01/20/2021 11/21/2019  Weight (lbs) 251 lb 12.3 oz 251 lb 242 lb  Weight (kg) 114.2 kg 113.853 kg 109.77 kg     Body mass index is 36.12 kg/m.  General:  Well nourished, well developed, in no acute distress HEENT: normal Lymph: no adenopathy Neck: no JVD Endocrine:  No thryomegaly Vascular: No carotid bruits; FA pulses 2+ bilaterally without bruits  Cardiac:  normal S1, S2; regular tachycardic; no murmur  Lungs:  clear to auscultation bilaterally, no wheezing, rhonchi or rales  Abd: soft, nontender, no hepatomegaly  Ext: 2-3+ BL LE  edema Musculoskeletal:  No deformities, BUE and BLE strength normal and equal Skin: warm and dry  Neuro:  CNs 2-12 intact, no focal abnormalities noted Psych:  Normal affect   EKG:  The EKG was personally reviewed and demonstrates: Atrial fibrillation at rate of 112 bpm, chronic left bundle branch block Telemetry:  Telemetry was personally reviewed and demonstrates: Atrial fibrillation at rate of 110 220 bpm  Relevant CV Studies:  Echo 02/2018 Study Conclusions   - Left ventricle: The cavity size was normal. There was mild  concentric hypertrophy. Systolic function was mildly to  moderately reduced. The estimated ejection fraction was in the  range of 40% to 45%. Diffuse hypokinesis. Doppler parameters  are  consistent with abnormal left ventricular  relaxation (grade 1  diastolic dysfunction). Doppler parameters are consistent with  indeterminate ventricular filling pressure.  - Aortic valve: Transvalvular velocity was within the normal range.  There was no stenosis. There was trivial regurgitation.  Regurgitation pressure half-time: 793 ms.  - Mitral valve: Transvalvular velocity was within the normal range.  There was no evidence for stenosis.  - Left atrium: The atrium was severely dilated.  - Right ventricle: The cavity size was normal. Wall thickness was  normal. Systolic function was normal.  - Right atrium: The atrium was moderately dilated.  - Tricuspid valve: There was mild regurgitation.  - Pulmonary arteries: Systolic pressure was mildly increased. PA  peak pressure: 44 mm Hg (S).  - Global longitudinal strain -12.5% (abnormal).    Cath 06/2014 Procedural Findings: Hemodynamics RA 7/6 mean 4 mm Hg RV 41/5 mm Hg PA 41/16 mean 27 mm Hg  PCWP 14/17 mean 11 mm Hg LV 152/11 mm Hg AO 150/66 mean 96 mm Hg  Oxygen saturations: PA 69% AO 95%  Cardiac Output (Fick) 6.9 L/min  Cardiac Index (Fick) 2.9 L/min/meter squared.            Coronary angiography: Coronary dominance: right  Left mainstem: Short, normal.  Left anterior descending (LAD): The LAD is occluded after a septal perforator. There are minimal right to left collaterals  There is a large ramus intermediate vessel that extends to the apex. There is an 80-90% proximal stenosis with a long segment of disease.   Left circumflex (LCx): The LCx gives off 3 OM branches. The first OM is very small in caliber and without obstructive disease. The second OM is small in caliber with 50-70% disease proximally. There third OM has 30% stenosis.  Right coronary artery (RCA): The RCA is a large dominant vessel. There is a 40% stenosis in the mid vessel.   Left ventriculography: Left ventricular  systolic function is normal, LVEF is estimated at 50-55%, there is no significant mitral regurgitation.   At the end of the diagnostic procedure we decided to proceed with PCI of the ramus intermediate vessel. The patient was anticoagulated with IV heparin. Brilinta 180 mg was given orally. Once a therapeutic ACT was obtained a 6 Fr XBLAD 3.5 guide was used to access the left coronary artery. A prowater wire was used to cross the lesion. The lesion was predilated with a 2.5 mm balloon. The lesion was then stented with a 3.0 x 38 mm Promus stent. The stent was post dilated with a 3.25 mm noncompliant balloon. Following PCI, there was 0% residual stenosis and TIMI 3 flow. Angiography confirmed an excellent result. The patient tolerated the procedure well without complications. He was transferred to the post procedure area for further monitoring.  Vessel: Ramus intermediate: proximal Percent stenosis pre- 80-90% TIMI flow pre- 3 Stent : 3.0 x 38 mm Promus Percent stenosis post- 0%. TIMI flow post- 3   Final Conclusions:   1. 2 vessel obstructive CAD. Chronic total occlusion of the LAD with minimal collaterals. Severe disease in a large ramus intermediate. 2. Good LV function 3. Normal right heart and LV filling pressures. 4. Successful stenting of the ramus intermediate with DES.  Recommendations: DAPT for one year. Treat other residual disease medically. He is not a candidate for CTO PCI. Anticipate DC in am.   Laboratory Data:  High Sensitivity Troponin:   Recent Labs  Lab 01/20/21 1731 01/20/21 1926  TROPONINIHS 20* 19*     Chemistry Recent Labs  Lab 01/20/21  1731 01/21/21 0234  NA 135 137  K 4.4 4.4  CL 106 108  CO2 20* 20*  GLUCOSE 96 120*  BUN 60* 60*  CREATININE 2.94* 2.88*  CALCIUM 8.2* 7.9*  GFRNONAA 20* 21*  ANIONGAP 9 9    Recent Labs  Lab 01/20/21 1731  PROT 6.9  ALBUMIN 2.7*  AST 24  ALT 16  ALKPHOS 155*  BILITOT 0.4   Hematology Recent Labs   Lab 01/20/21 1731 01/21/21 0234  WBC 13.1* 11.2*  RBC 3.73* 3.44*  HGB 10.3* 9.6*  HCT 33.8* 31.1*  MCV 90.6 90.4  MCH 27.6 27.9  MCHC 30.5 30.9  RDW 14.6 14.6  PLT 328 295   BNP Recent Labs  Lab 01/20/21 1731  BNP 705.9*    Radiology/Studies:  DG Chest Port 1 View  Result Date: 01/20/2021 CLINICAL DATA:  Shortness of breath EXAM: PORTABLE CHEST 1 VIEW COMPARISON:  July 26, 2018 FINDINGS: The heart size and mediastinal contours are mildly enlarged. There is prominence of the central pulmonary vasculature. No large airspace consolidation or pleural effusion. No acute osseous abnormality. IMPRESSION: Cardiomegaly and mild pulmonary vascular congestion. Electronically Signed   By: Prudencio Pair M.D.   On: 01/20/2021 17:16    Assessment and Plan:   1. Acute on chronic combined CHF -BNP 705.  Chest x-ray with cardiomegaly and mild pulmonary vascular congestion. -His symptoms could be due to atrial fibrillation -No strict INO or daily weight reported so far but patient endorses improved symptoms and frequent urination -On IV Lasix 60 mg twice daily>>increase to IV lasix 149m BID -Pending repeat  echocardiogram -He is not sure if he is getting high salt diet at facility - Add metoprolol 254mBID >> uptitate and then consolidate to Toprol  - Add Imdur/Hydralazine when stable   2.  Atrial fibrillation/flutter at rapid ventricular rate -Patient denies palpitation and does not remember prior symptoms with A. Fib -He is noted in A. fib with RVR while admitted at rate of 110 to 120 bpm -He is on maintenance dose of amiodarone at 200 mg daily>> will stop (failed)  -On Eliquis 2.5 mg twice daily (reports compliance) -May consider cardioversion -Add BB for rate control   3. CAD status post DES to the RI and known chronic occlusion of the LAD by cath in 2015 -Patient denies chest tightness or heaviness -Not on aspirin due to need of anticoagulation -Continue Lipitor 40 mg  daily  4.  Chronic kidney disease stage III/IV -Unknown recent baseline.  He is followed by nephrology. -Creatinine is improving with diuresis 2.94>>2.88 -Avoid nephrotoxic agent  5.  Hypertension -Blood pressure stable without being on antihypertensive  Risk Assessment/Risk Scores:   New York Heart Association (NYHA) Functional Class NYHA Class III  CHA2DS2-VASc Score = 6  This indicates a 9.7% annual risk of stroke. The patient's score is based upon: CHF History: Yes HTN History: Yes Diabetes History: Yes Stroke History: No Vascular Disease History: Yes Age Score: 2 Gender Score: 0  For questions or updates, please contact [facility] consult [email or web address] for contact info under    SiJarrett SohoPAU[place]4/04/2021 2:58 PM

## 2021-01-21 NOTE — Progress Notes (Addendum)
Progress Note    [name] [name]  [name] [name] DOB: [birth year]  DOA: 01/20/2021 PCP: [name], MD    Brief Narrative:     Medical records reviewed and are as summarized below:  [name] [name] is an 87 y.o. male with medical history significant of with medical history significant of atrial fibrillation, diastolic dysfunction CHF, hypertension, hypothyroidism, hyperlipidemia, chronic kidney disease stage III.Marland Kitchen  He has become progressively weak with exertional dyspnea.  Also orthopnea and PND.  He has worsening lower extremity edema.  Patient takes 120 mg of Lasix a day but still swollen.  He was seen by his PCP and medications adjusted but still not doing better.    Assessment/Plan:   Principal Problem:   Chronic combined systolic and diastolic CHF (congestive heart failure) ([name]) Active Problems:   CAD (coronary artery disease)   Diabetes mellitus with diabetic nephropathy without long-term current use of insulin ([name])   Acute renal failure superimposed on stage 3 chronic kidney disease ([name])   GERD (gastroesophageal reflux disease)   HTN (hypertension)   Paroxysmal atrial fibrillation ([name])   chronic combined systolic and diastolic CHF with acute exacerbation:  -IV lasix -Monitor renal function.  -cards consult -repeat echo ordered in ER and is pending -wt up from 2020 about 30 lbs  moderate pulmonary HTN  -repeat echo  acute on chronic kidney failure stage IIIb:  -Patient said he has nephrology follow-up scheduled for later this month --so far improving with diuresis  essential hypertension:  -continue home regimen.   diabetes:  -Continue with sliding scale insulin. -hold insulin/PO meds  GERD:  -Continue with PPIs.  paroxysmal atrial fibrillation:  -Patient has RVR now.   -Resume home regimen with anticoagulation.   -Continue amiodarone.    obesity Body mass index is 36.01 kg/m.   Family Communication/Anticipated D/C date and plan/Code Status    DVT prophylaxis: eliquis Code Status: Full Code.  Family Communication: called sister, Joycelyn Schmid Disposition Plan: Status is: Inpatient  Remains inpatient appropriate because:Inpatient level of care appropriate due to severity of illness   Dispo: The patient is from: Home              Anticipated d/c is to: Home              Patient currently is not medically stable to d/c.   Difficult to place patient No         Medical Consultants:    cards     Subjective:   Still very sob and uncomfortable in the bed  Objective:    Vitals:   01/21/21 0420 01/21/21 0651 01/21/21 0700 01/21/21 1030  BP: 128/86 97/69 99/60  126/85  Pulse: (!) 108 89 (!) 55 (!) 115  Resp: 20  16 19   Temp: 98.2 F (36.8 C)   98.2 F (36.8 C)  TempSrc: Oral   Oral  SpO2: 98% 96% 95% 95%  Weight:      Height:        Intake/Output Summary (Last 24 hours) at 01/21/2021 1048 Last data filed at 01/21/2021 0105 Gross per 24 hour  Intake --  Output 600 ml  Net -600 ml   Filed Weights   01/20/21 1635  Weight: 113.9 kg    Exam:  General: Appearance:    Obese male in no acute distress     Lungs:     Diminished, no wheezing  Heart:    Tachycardic. Irregularly irregular rhythm.   MS:   All extremities are  intact. +LE Edema  Neurologic:   Awake, alert, oriented x 3. No apparent focal neurological           defect.     Data Reviewed:   I have personally reviewed following labs and imaging studies:  Labs: Labs show the following:   Basic Metabolic Panel: Recent Labs  Lab 01/20/21 1731 01/21/21 0234  NA 135 137  K 4.4 4.4  CL 106 108  CO2 20* 20*  GLUCOSE 96 120*  BUN 60* 60*  CREATININE 2.94* 2.88*  CALCIUM 8.2* 7.9*   GFR Estimated Creatinine Clearance: 23.7 mL/min (A) (by C-G formula based on SCr of 2.88 mg/dL (H)). Liver Function Tests: Recent Labs  Lab 01/20/21 1731  AST 24  ALT 16  ALKPHOS 155*  BILITOT 0.4  PROT 6.9  ALBUMIN 2.7*   No results for input(s):  LIPASE, AMYLASE in the last 168 hours. No results for input(s): AMMONIA in the last 168 hours. Coagulation profile No results for input(s): INR, PROTIME in the last 168 hours.  CBC: Recent Labs  Lab 01/20/21 1731 01/21/21 0234  WBC 13.1* 11.2*  NEUTROABS 9.8* 8.4*  HGB 10.3* 9.6*  HCT 33.8* 31.1*  MCV 90.6 90.4  PLT 328 295   Cardiac Enzymes: No results for input(s): CKTOTAL, CKMB, CKMBINDEX, TROPONINI in the last 168 hours. BNP (last 3 results) No results for input(s): PROBNP in the last 8760 hours. CBG: Recent Labs  Lab 01/20/21 2125 01/20/21 2152 01/21/21 0630 01/21/21 0658 01/21/21 0757  GLUCAP 49* 83 39* 183* 150*   D-Dimer: No results for input(s): DDIMER in the last 72 hours. Hgb A1c: Recent Labs    01/21/21 0234  HGBA1C 6.6*   Lipid Profile: No results for input(s): CHOL, HDL, LDLCALC, TRIG, CHOLHDL, LDLDIRECT in the last 72 hours. Thyroid function studies: No results for input(s): TSH, T4TOTAL, T3FREE, THYROIDAB in the last 72 hours.  Invalid input(s): FREET3 Anemia work up: No results for input(s): VITAMINB12, FOLATE, FERRITIN, TIBC, IRON, RETICCTPCT in the last 72 hours. Sepsis Labs: Recent Labs  Lab 01/20/21 1731 01/21/21 0234  WBC 13.1* 11.2*    Microbiology Recent Results (from the past 240 hour(s))  SARS CORONAVIRUS 2 (TAT 6-24 HRS) Nasopharyngeal Nasopharyngeal Swab     Status: None   Collection Time: 01/20/21  4:35 PM   Specimen: Nasopharyngeal Swab  Result Value Ref Range Status   SARS Coronavirus 2 NEGATIVE NEGATIVE Final    Comment: (NOTE) SARS-CoV-2 target nucleic acids are NOT DETECTED.  The SARS-CoV-2 RNA is generally detectable in upper and lower respiratory specimens during the acute phase of infection. Negative results do not preclude SARS-CoV-2 infection, do not rule out co-infections with other pathogens, and should not be used as the sole basis for treatment or other patient management decisions. Negative results must  be combined with clinical observations, patient history, and epidemiological information. The expected result is Negative.  Fact Sheet for Patients: SugarRoll.be  Fact Sheet for Healthcare Providers: https://www.woods-mathews.com/  This test is not yet approved or cleared by the [place] FDA and  has been authorized for detection and/or diagnosis of SARS-CoV-2 by FDA under an Emergency Use Authorization (EUA). This EUA will remain  in effect (meaning this test can be used) for the duration of the COVID-19 declaration under Se ction 564(b)(1) of the Act, 21 U.S.C. section 360bbb-3(b)(1), unless the authorization is terminated or revoked sooner.  Performed at [facility], [place] [address] [place], [place] [address]     Procedures  and diagnostic studies:  DG Chest Port 1 View  Result Date: 01/20/2021 CLINICAL DATA:  Shortness of breath EXAM: PORTABLE CHEST 1 VIEW COMPARISON:  July 26, 2018 FINDINGS: The heart size and mediastinal contours are mildly enlarged. There is prominence of the central pulmonary vasculature. No large airspace consolidation or pleural effusion. No acute osseous abnormality. IMPRESSION: Cardiomegaly and mild pulmonary vascular congestion. Electronically Signed   By: Prudencio Pair M.D.   On: 01/20/2021 17:16    Medications:   . amiodarone  200 mg Oral Daily  . apixaban  2.5 mg Oral BID  . atorvastatin  40 mg Oral q1800  . furosemide  60 mg Intravenous BID  . insulin aspart  0-5 Units Subcutaneous QHS  . insulin aspart  0-9 Units Subcutaneous TID WC  . pantoprazole  40 mg Oral BID  . sodium chloride flush  3 mL Intravenous Q12H   Continuous Infusions: . sodium chloride       LOS: 1 day   Geradine Girt  [facility]   How to contact the [facility] Attending or Consulting provider [place] or covering provider during after hours [place], for this patient?  1. Check the care team in [facility] and look for  a) attending/consulting TRH provider listed and b) the [facility] team listed 2. Log into [email or web address] and use [place]'s universal password to access. If you do not have the password, please contact the hospital operator. 3. Locate the [facility] provider you are looking for under [facility] and page to a number that you can be directly reached. 4. If you still have difficulty reaching the provider, please page the [facility] (Director on Call) for the Hospitalists listed on amion for assistance.  01/21/2021, 10:48 AM

## 2021-01-21 NOTE — Plan of Care (Signed)

## 2021-01-21 NOTE — ED Notes (Signed)
Provided pt with his phone, told him to call his sister for update.

## 2021-01-22 ENCOUNTER — Inpatient Hospital Stay (HOSPITAL_COMMUNITY): Payer: Medicare Other

## 2021-01-22 DIAGNOSIS — I5021 Acute systolic (congestive) heart failure: Secondary | ICD-10-CM

## 2021-01-22 DIAGNOSIS — I5023 Acute on chronic systolic (congestive) heart failure: Secondary | ICD-10-CM | POA: Diagnosis not present

## 2021-01-22 DIAGNOSIS — I4891 Unspecified atrial fibrillation: Secondary | ICD-10-CM | POA: Diagnosis not present

## 2021-01-22 DIAGNOSIS — I2510 Atherosclerotic heart disease of native coronary artery without angina pectoris: Secondary | ICD-10-CM | POA: Diagnosis not present

## 2021-01-22 DIAGNOSIS — I5042 Chronic combined systolic (congestive) and diastolic (congestive) heart failure: Secondary | ICD-10-CM | POA: Diagnosis not present

## 2021-01-22 DIAGNOSIS — N179 Acute kidney failure, unspecified: Secondary | ICD-10-CM | POA: Diagnosis not present

## 2021-01-22 LAB — ECHOCARDIOGRAM COMPLETE
AR max vel: 2.7 cm2
AV Area VTI: 2.4 cm2
AV Area mean vel: 2.57 cm2
AV Mean grad: 3 mmHg
AV Peak grad: 5.4 mmHg
Ao pk vel: 1.16 m/s
Area-P 1/2: 5.65 cm2
Calc EF: 19.4 %
Height: 70 in
MV M vel: 3.89 m/s
MV Peak grad: 60.5 mmHg
MV VTI: 1.98 cm2
Radius: 0.5 cm
S' Lateral: 4.4 cm
Single Plane A2C EF: 35.3 %
Single Plane A4C EF: 11.9 %
Weight: 3971.81 oz

## 2021-01-22 LAB — BASIC METABOLIC PANEL
Anion gap: 8 (ref 5–15)
BUN: 58 mg/dL — ABNORMAL HIGH (ref 8–23)
CO2: 23 mmol/L (ref 22–32)
Calcium: 8 mg/dL — ABNORMAL LOW (ref 8.9–10.3)
Chloride: 105 mmol/L (ref 98–111)
Creatinine, Ser: 2.75 mg/dL — ABNORMAL HIGH (ref 0.61–1.24)
GFR, Estimated: 22 mL/min — ABNORMAL LOW (ref >60)
Glucose, Bld: 139 mg/dL — ABNORMAL HIGH (ref 70–99)
Potassium: 3.8 mmol/L (ref 3.5–5.1)
Sodium: 136 mmol/L (ref 135–145)

## 2021-01-22 LAB — GLUCOSE, CAPILLARY
Glucose-Capillary: 79 mg/dL (ref 70–99)
Glucose-Capillary: 114 mg/dL — ABNORMAL HIGH (ref 70–99)
Glucose-Capillary: 140 mg/dL — ABNORMAL HIGH (ref 70–99)
Glucose-Capillary: 150 mg/dL — ABNORMAL HIGH (ref 70–99)

## 2021-01-22 MED ORDER — PERFLUTREN LIPID MICROSPHERE
1.0000 mL | INTRAVENOUS | Status: AC | PRN
  Administered 2021-01-22: 2 mL via INTRAVENOUS
  Filled 2021-01-22: qty 10

## 2021-01-22 MED ORDER — POTASSIUM CHLORIDE CRYS ER 20 MEQ PO TBCR
30.0000 meq | EXTENDED_RELEASE_TABLET | Freq: Two times a day (BID) | ORAL | Status: DC
  Administered 2021-01-22 – 2021-01-23 (×3): 30 meq via ORAL
  Filled 2021-01-22 (×3): qty 1

## 2021-01-22 MED ORDER — METOPROLOL TARTRATE 12.5 MG HALF TABLET
12.5000 mg | ORAL_TABLET | Freq: Two times a day (BID) | ORAL | Status: DC
  Filled 2021-01-22 (×2): qty 1

## 2021-01-22 MED ORDER — MIDODRINE HCL 5 MG PO TABS
10.0000 mg | ORAL_TABLET | Freq: Three times a day (TID) | ORAL | Status: DC
  Administered 2021-01-22 – 2021-01-28 (×17): 10 mg via ORAL
  Filled 2021-01-22 (×17): qty 2

## 2021-01-22 NOTE — Consult Note (Signed)
[place] Nurse ostomy follow up Patient receiving care in [place]. Stoma type/location: RUQ ileostomy Stomal assessment/size: deferred, supplies must be obtained. Peristomal assessment: deferred Treatment options for stomal/peristomal skin: Patient uses paste and a pouch with a clip and a very small skin barrier and pouch. I explained we don't have paste and will substitute barrier rings, and get the closest 2 piece pouching system to what he uses.   Output: thin effluent in opaque pouch  Ostomy pouching: 2pc.  Education provided: none. Patient's ostomy created in 1985 per patient. Keep the following ostomy supplies at the bedside: Pouch, Kellie Simmering #234 and skin barrier, Kellie Simmering #644.  If these are unavailable from [employer], use Pouch, Kellie Simmering [phone] and skin barrier Kellie Simmering #2.  Also, barrier rings Kellie Simmering [phone] Monitor the wound area(s) for worsening of condition such as: Signs/symptoms of infection,  Increase in size,  Development of or worsening of odor, Development of pain, or increased pain at the affected locations.  Notify the medical team if any of these develop.  Thank you for the consult.  Discussed plan of care with the patient and bedside nurse.  [place] nurse will not follow at this time.  Please re-consult the [place] team if needed.  Val Riles, RN, MSN, CWOCN, CNS-BC, pager [phone]

## 2021-01-22 NOTE — Plan of Care (Signed)

## 2021-01-22 NOTE — NC FL2 (Signed)
[place] LEVEL OF CARE SCREENING TOOL     IDENTIFICATION  Patient Name: [name] [name] Birthdate: 27-Feb-1933 Sex: male Admission Date (Current Location): 01/20/2021  [facility] and [place] Number:  Herbalist and Address:  The [place], [place] [address], [place], [place] [address]      Provider Number: [id number]  Attending Physician Name and Address:  Kayleen Memos, DO  Relative Name and Phone Number:  Azucena Freed (Sister)  [phone]    Current Level of Care: Hospital Recommended Level of Care: [place] Prior Approval Number:    Date Approved/Denied:   PASRR Number:    Discharge Plan: Other (Comment) (ALF)    Current Diagnoses: Patient Active Problem List   Diagnosis Date Noted  . CHF (congestive heart failure), NYHA class I, acute on chronic, diastolic ([place]) 82/99/3716  . Advanced nonexudative age-related macular degeneration of right eye with subfoveal involvement 01/12/2021  . Posterior vitreous detachment of both eyes 01/12/2021  . Intermediate stage nonexudative age-related macular degeneration of left eye 04/02/2020  . Paroxysmal atrial fibrillation ([place]) 06/10/2019  . B12 deficiency 08/13/2018  . Acute gastric ulcer with hemorrhage   . Symptomatic anemia 07/26/2018  . Chronic combined systolic and diastolic CHF (congestive heart failure) ([place]) 04/10/2018  . Pelvic fracture ([place]) 03/28/2018  . Severe protein-calorie malnutrition ([place])   . Diabetic polyneuropathy associated with type 2 diabetes mellitus ([place])   . DCM (dilated cardiomyopathy) ([place]) 02/27/2018  . Pressure injury of skin 02/20/2018  . Hyperkalemia 02/19/2018  . Elevated troponin 02/19/2018  . GERD (gastroesophageal reflux disease) 02/19/2018  . Type II diabetes mellitus with renal manifestations ([place]) 02/19/2018  . HTN (hypertension) 02/19/2018  . AKI (acute kidney injury) ([place])   . Anaphylactic syndrome   . Drug-induced skin rash    . Acute metabolic encephalopathy 96/78/9381  . Allergic reaction caused by a drug 12/05/2017  . Acute renal failure superimposed on stage 3 chronic kidney disease ([place]) 12/05/2017  . Metabolic acidosis 01/75/1025  . Hardware complicating wound infection ([place]) 11/30/2017  . Trimalleolar fracture of ankle, closed, left, sequela   . Fall 10/24/2017  . Malleolar fracture, left, closed, initial encounter 10/24/2017  . Fibula fracture 10/24/2017  . Diabetes mellitus with diabetic nephropathy without long-term current use of insulin ([place]) 01/05/2017  . CKD (chronic kidney disease), stage III ([place])   . Persistent atrial fibrillation ([place])   . Bleeding gastrointestinal   . Dyslipidemia 10/14/2014  . CAD (coronary artery disease) 07/11/2014  . Ulcerative colitis ([place])     Orientation RESPIRATION BLADDER Height & Weight     Self,Time,Situation,Place  Normal External catheter,Incontinent Weight: 248 lb 3.8 oz (112.6 kg) Height:  5' 10"  (177.8 cm)  BEHAVIORAL SYMPTOMS/MOOD NEUROLOGICAL BOWEL NUTRITION STATUS      Continent Diet (See DC Summary)  AMBULATORY STATUS COMMUNICATION OF NEEDS Skin   Limited Assist Verbally Normal                       Personal Care Assistance Level of Assistance  Bathing,Dressing,Feeding Bathing Assistance: Maximum assistance Feeding assistance: Independent Dressing Assistance: Independent     Functional Limitations Info  Sight,Hearing,Speech Sight Info: Impaired Hearing Info: Impaired Speech Info: Adequate    SPECIAL CARE FACTORS FREQUENCY  PT (By licensed PT),OT (By licensed OT)     PT Frequency: 3x a week OT Frequency: 3x a week            Contractures Contractures Info: Not  present    Additional Factors Info  Code Status,Allergies Code Status Info: DNR Allergies Info: Brilinta (Ticagrelor)   Clindamycin/lincomycin   Doxycycline   Penicillins   Broccoli (Brassica Oleracea)   Sulfa Antibiotics   Tape           Current Medications  (01/22/2021):  This is the current hospital active medication list Current Facility-Administered Medications  Medication Dose Route Frequency Provider Last Rate Last Admin  . 0.9 %  sodium chloride infusion  250 mL Intravenous PRN Elwyn Reach, MD      . acetaminophen (TYLENOL) tablet 650 mg  650 mg Oral Q4H PRN Elwyn Reach, MD      . apixaban (ELIQUIS) tablet 2.5 mg  2.5 mg Oral BID Gala Romney L, MD   2.5 mg at 01/22/21 0821  . atorvastatin (LIPITOR) tablet 40 mg  40 mg Oral q1800 Vann, Jessica U, DO   40 mg at 01/21/21 1638  . furosemide (LASIX) 120 mg in dextrose 5 % 50 mL IVPB  120 mg Intravenous BID Bhagat, Bhavinkumar, PA 62 mL/hr at 01/22/21 0825 120 mg at 01/22/21 0825  . insulin aspart (novoLOG) injection 0-5 Units  0-5 Units Subcutaneous QHS Garba, Mohammad L, MD      . insulin aspart (novoLOG) injection 0-9 Units  0-9 Units Subcutaneous TID WC Elwyn Reach, MD   5 Units at 01/21/21 1638  . metoprolol tartrate (LOPRESSOR) tablet 25 mg  25 mg Oral BID Bhagat, Bhavinkumar, PA   25 mg at 01/22/21 0821  . ondansetron (ZOFRAN) injection 4 mg  4 mg Intravenous Q6H PRN Gala Romney L, MD      . pantoprazole (PROTONIX) EC tablet 40 mg  40 mg Oral BID Eulogio Bear U, DO   40 mg at 01/22/21 2761  . potassium chloride (KLOR-CON) CR tablet 30 mEq  30 mEq Oral BID Bhagat, Bhavinkumar, PA   30 mEq at 01/22/21 0942  . sodium chloride flush (NS) 0.9 % injection 3 mL  3 mL Intravenous Q12H Gala Romney L, MD   3 mL at 01/22/21 0825  . sodium chloride flush (NS) 0.9 % injection 3 mL  3 mL Intravenous PRN Elwyn Reach, MD         Discharge Medications: Please see discharge summary for a list of discharge medications.  Relevant Imaging Results:  Relevant Lab Results:   Additional Information SS#: [id number], [place]

## 2021-01-22 NOTE — Progress Notes (Signed)
  Echocardiogram 2D Echocardiogram with definity has been performed.  Darlina Sicilian M 01/22/2021, 1:31 PM

## 2021-01-22 NOTE — TOC Initial Note (Addendum)
Transition of Care [facility]) - Initial/Assessment Note    Patient Details  Name: [name] [name] MRN: [id number] Date of Birth: Aug 27, 1933  Transition of Care [facility]) CM/SW Contact:    Tresa Endo Phone Number: 01/22/2021, 4:00 PM  Clinical Narrative:                 3:45pm Pt is from [facility], per pt note pt can return to facility. CSW spoke with pt about dc plan he stated he will get one of his sisters to pick him up his sister [name] agreed, they prefer pt not to take PTAR unless its medically needed. FL2 complete and faxed to [facility], CSW contacted [facility] admissions/no answer VM left. CSW will follow up prior to DC.  Expected Discharge Plan: Assisted Living Barriers to Discharge: Continued Medical Work up   Patient Goals and CMS Choice Patient states their goals for this hospitalization and ongoing recovery are:: Return to ALF      Expected Discharge Plan and Services Expected Discharge Plan: Assisted Living In-house Referral: Clinical Social Work Discharge Planning Services: Other - See comment (CSW) Post Acute Care Choice:  (ALF) Living arrangements for the past 2 months: [place]                                      Prior Living Arrangements/Services Living arrangements for the past 2 months: [place] Lives with:: Facility Resident Patient language and need for interpreter reviewed:: No        Need for Family Participation in Patient Care: No (Comment) Care giver support system in place?: Yes (comment) ([facility])   Criminal Activity/Legal Involvement Pertinent to Current Situation/Hospitalization: No - Comment as needed  Activities of Daily Living Home Assistive Devices/Equipment: Environmental consultant (specify type),Wheelchair ADL Screening (condition at time of admission) Patient's cognitive ability adequate to safely complete daily activities?: Yes Is the patient deaf or have difficulty hearing?:  No Does the patient have difficulty seeing, even when wearing glasses/contacts?: No Does the patient have difficulty concentrating, remembering, or making decisions?: No Patient able to express need for assistance with ADLs?: Yes Does the patient have difficulty dressing or bathing?: Yes Independently performs ADLs?: Yes (appropriate for developmental age) Does the patient have difficulty walking or climbing stairs?: Yes Weakness of Legs: Both Weakness of Arms/Hands: None  Permission Sought/Granted                  Emotional Assessment Appearance:: Appears stated age Attitude/Demeanor/Rapport: Unable to Assess Affect (typically observed): Unable to Assess Orientation: : Oriented to Self,Oriented to Place,Oriented to  Time,Oriented to Situation Alcohol / Substance Use: Not Applicable Psych Involvement: No (comment)  Admission diagnosis:  Paroxysmal atrial fibrillation ([name]) [I48.0] CHF (congestive heart failure), NYHA class I, acute on chronic, diastolic ([name]) [W09.81] Acute on chronic congestive heart failure, unspecified heart failure type [facility]) [I50.9] Patient Active Problem List   Diagnosis Date Noted  . CHF (congestive heart failure), NYHA class I, acute on chronic, diastolic ([place]) 19/14/7829  . Advanced nonexudative age-related macular degeneration of right eye with subfoveal involvement 01/12/2021  . Posterior vitreous detachment of both eyes 01/12/2021  . Intermediate stage nonexudative age-related macular degeneration of left eye 04/02/2020  . Paroxysmal atrial fibrillation ([place]) 06/10/2019  . B12 deficiency 08/13/2018  . Acute gastric ulcer with hemorrhage   . Symptomatic anemia 07/26/2018  . Chronic combined  systolic and diastolic CHF (congestive heart failure) ([place]) 04/10/2018  . Pelvic fracture ([place]) 03/28/2018  . Severe protein-calorie malnutrition ([place])   . Diabetic polyneuropathy associated with type 2 diabetes mellitus ([place])   . DCM (dilated cardiomyopathy)  ([place]) 02/27/2018  . Pressure injury of skin 02/20/2018  . Hyperkalemia 02/19/2018  . Elevated troponin 02/19/2018  . GERD (gastroesophageal reflux disease) 02/19/2018  . Type II diabetes mellitus with renal manifestations ([place]) 02/19/2018  . HTN (hypertension) 02/19/2018  . AKI (acute kidney injury) ([place])   . Anaphylactic syndrome   . Drug-induced skin rash   . Acute metabolic encephalopathy 84/72/0721  . Allergic reaction caused by a drug 12/05/2017  . Acute renal failure superimposed on stage 3 chronic kidney disease ([place]) 12/05/2017  . Metabolic acidosis 82/88/3374  . Hardware complicating wound infection ([place]) 11/30/2017  . Trimalleolar fracture of ankle, closed, left, sequela   . Fall 10/24/2017  . Malleolar fracture, left, closed, initial encounter 10/24/2017  . Fibula fracture 10/24/2017  . Diabetes mellitus with diabetic nephropathy without long-term current use of insulin ([place]) 01/05/2017  . CKD (chronic kidney disease), stage III ([place])   . Persistent atrial fibrillation ([place])   . Bleeding gastrointestinal   . Dyslipidemia 10/14/2014  . CAD (coronary artery disease) 07/11/2014  . Ulcerative colitis ([place])    PCP:  [name], MD Pharmacy:   [facility], [address] [place] [address] Phone: [phone] Fax: [phone]     Social Determinants of Health (SDOH) Interventions    Readmission Risk Interventions No flowsheet data found.

## 2021-01-22 NOTE — Progress Notes (Addendum)
Progress Note  Patient Name: [name] [name] Date of Encounter: 01/22/2021  [facility] Cardiologist: [name], MD   Subjective   Breathing improving. No chest pain or palpitations.   Inpatient Medications    Scheduled Meds: . apixaban  2.5 mg Oral BID  . atorvastatin  40 mg Oral q1800  . insulin aspart  0-5 Units Subcutaneous QHS  . insulin aspart  0-9 Units Subcutaneous TID WC  . metoprolol tartrate  25 mg Oral BID  . pantoprazole  40 mg Oral BID  . sodium chloride flush  3 mL Intravenous Q12H   Continuous Infusions: . sodium chloride    . furosemide 120 mg (01/21/21 1721)   PRN Meds: sodium chloride, acetaminophen, ondansetron (ZOFRAN) IV, sodium chloride flush   Vital Signs    Vitals:   01/21/21 2333 01/22/21 0535 01/22/21 0600 01/22/21 0732  BP: (!) 81/47 103/71  107/70  Pulse: 77 85  95  Resp: 18 18  18   Temp: (!) 97.4 F (36.3 C) 98.2 F (36.8 C)  98 F (36.7 C)  TempSrc:      SpO2: 97% 93%  100%  Weight:   112.6 kg   Height:        Intake/Output Summary (Last 24 hours) at 01/22/2021 0817 Last data filed at 01/22/2021 0802 Gross per 24 hour  Intake 417 ml  Output 1750 ml  Net -1333 ml   Last 3 Weights 01/22/2021 01/21/2021 01/20/2021  Weight (lbs) 248 lb 3.8 oz 251 lb 12.3 oz 251 lb  Weight (kg) 112.6 kg 114.2 kg 113.853 kg      Telemetry    Afib at rate of 100s - Personally Reviewed  ECG    N/A  Physical Exam   GEN: Obese male in no acute distress.   Neck: + JVD Cardiac: RRR, no murmurs, rubs, or gallops.  Respiratory: Clear to auscultation bilaterally. GI: Soft, nontender, non-distended  KN:LZJQB edema; No deformity. Neuro:  Nonfocal  Psych: Normal affect   Labs    High Sensitivity Troponin:   Recent Labs  Lab 01/20/21 1731 01/20/21 1926  TROPONINIHS 20* 19*      Chemistry Recent Labs  Lab 01/20/21 1731 01/21/21 0234 01/22/21 0150  NA 135 137 136  K 4.4 4.4 3.8  CL 106 108 105  CO2 20* 20* 23  GLUCOSE 96 120* 139*   BUN 60* 60* 58*  CREATININE 2.94* 2.88* 2.75*  CALCIUM 8.2* 7.9* 8.0*  PROT 6.9  --   --   ALBUMIN 2.7*  --   --   AST 24  --   --   ALT 16  --   --   ALKPHOS 155*  --   --   BILITOT 0.4  --   --   GFRNONAA 20* 21* 22*  ANIONGAP 9 9 8      Hematology Recent Labs  Lab 01/20/21 1731 01/21/21 0234  WBC 13.1* 11.2*  RBC 3.73* 3.44*  HGB 10.3* 9.6*  HCT 33.8* 31.1*  MCV 90.6 90.4  MCH 27.6 27.9  MCHC 30.5 30.9  RDW 14.6 14.6  PLT 328 295    BNP Recent Labs  Lab 01/20/21 1731  BNP 705.9*     Radiology    DG Chest Port 1 View  Result Date: 01/20/2021 CLINICAL DATA:  Shortness of breath EXAM: PORTABLE CHEST 1 VIEW COMPARISON:  July 26, 2018 FINDINGS: The heart size and mediastinal contours are mildly enlarged. There is prominence of the central pulmonary vasculature. No large  airspace consolidation or pleural effusion. No acute osseous abnormality. IMPRESSION: Cardiomegaly and mild pulmonary vascular congestion. Electronically Signed   By: Prudencio Pair M.D.   On: 01/20/2021 17:16    Cardiac Studies   Pending echo this admission   Patient Profile     87 y.o. male with a hx of CAD status post DES to the RI and CTO of the LAD, paroxysmal atrial fibrillation/ flutter,  chronic systolic and diastolic CHF, CKD IV, HLD  and hypertension who is being seen for the evaluation of CHF and AFib RVR.   Assessment & Plan   1. Acute on chronic combined CHF -BNP 705.  Chest x-ray with cardiomegaly and mild pulmonary vascular congestion. -His symptoms could be due to atrial fibrillation -No strict INO or daily weight reported ER but patient endorsed frequent urination -Lasix increased to IV lasix 172m BID. - Net I & O almost 2 L. Weight down 3 lb (251>>>248lb).  -Pending repeat  echocardiogram - Added metoprolol 243mBID >> uptitate if needed and then consolidate to Toprol prior to discharge  - Add Imdur/Hydralazine when stable (wait for echo)  2.  Atrial  fibrillation/flutter at rapid ventricular rate -Patient denies palpitation and does not remember prior symptoms with A. Fib -He is noted to be in  A. fib with RVR while admitted at rate of 110 to 120 bpm -He was on maintenance dose of amiodarone at 200 mg daily>> will stop (failed)  -On Eliquis 2.5 mg twice daily (reports compliance). Age and renal appropriate.  -Added BB for rate control (prior hx of bradycardia when hyperkalemic) >> up titrate as needed. HR now improved to 100s with diuresis. Dr. O'Audie Boxecommended rate control strategies.   3. CAD status post DES to the RI and known chronic occlusion of the LAD by cath in 2015 -Patient denies chest tightness or heaviness -Not on aspirin due to need of anticoagulation -Continue Lipitor 40 mg daily - Added BB this admission   4.  Chronic kidney disease stage III/IV -Unknown recent baseline.  [facility] -Creatinine is improving with diuresis 2.94>>2.88>>2.75 -Avoid nephrotoxic agent  5.  Hypertension -Blood pressure stable without being on antihypertensive - BB added as above   For questions or updates, please contact [facility] consult [email or web address] for contact info under        Signed, Leanor KailPA  01/22/2021, 8:17 AM

## 2021-01-22 NOTE — Evaluation (Signed)
Physical Therapy Evaluation Patient Details Name: [name] [name] MRN: [id number] DOB: Nov 04, 1932 Today's Date: 01/22/2021   History of Present Illness  Patient is a 87 y/o male who presents on 01/21/21 with dyspnea and bilateral lower extremity edema.  Work-up revealed acute on chronic combined diastolic and systolic CHF complicated by A-fib with RVR. CXR-pulmonary edema. PMH includes colectomy, colostomy, paroxysmal A-fib, HTN, DM, dilated cardiomyopathy with EF 35-40%, CKD, CAD.  Clinical Impression  Patient presents with dyspnea at rest, worsened with exertion, decreased cardiopulmonary endurance, impaired balance and impaired mobility s/p above. Pt is from [facility] and reports minimal ambulation in room with use of RW otherwise uses power w/c to get to dining hall and to get to bathroom due to poor balance. Does his own dressing but gets assist getting in/out of the shower. Today, pt tolerated bed mobility, transfers and gait training with Min guard assist and use of RW for support. Noted to have 2/4 DOE. HR in A-fib, up to 132 bpm max with activity. Sp02 remained in high 90s on RA. Encouraged activity and walking over the weekend. Will follow acutely to maximize independence and mobility prior to return home.    Follow Up Recommendations No PT follow up;Supervision - Intermittent (pending progress)    Equipment Recommendations  None recommended by PT    Recommendations for Other Services       Precautions / Restrictions Precautions Precautions: Fall Restrictions Weight Bearing Restrictions: No      Mobility  Bed Mobility Overal bed mobility: Modified Independent;Needs Assistance Bed Mobility: Supine to Sit;Sit to Supine     Supine to sit: Min guard;HOB elevated Sit to supine: Supervision;HOB elevated   General bed mobility comments: Heavy use of rail to get to EOB with increased effort.    Transfers Overall transfer level: Needs assistance Equipment used: Rolling  walker (2 wheeled) Transfers: Sit to/from Stand Sit to Stand: Min guard         General transfer comment: Min guard for safety. Stood from [employer], from chair x1.  Ambulation/Gait Ambulation/Gait assistance: Min guard Gait Distance (Feet): 95 Feet Assistive device: Rolling walker (2 wheeled) Gait Pattern/deviations: Step-through pattern;Decreased stride length;Trunk flexed   Gait velocity interpretation: 1.31 - 2.62 ft/sec, indicative of limited community ambulator General Gait Details: Fast gait speed with use of RW for support; 2/4 DOE. HR up to 132 bpm A-fib, Sp02 99% on RA.  Stairs            Wheelchair Mobility    Modified Rankin (Stroke Patients Only)       Balance Overall balance assessment: Needs assistance Sitting-balance support: Feet supported;No upper extremity supported Sitting balance-[name]: Good     Standing balance support: During functional activity Standing balance-[name]: Poor Standing balance comment: Requires UE support                             Pertinent Vitals/Pain Pain Assessment: No/denies pain    Home Living Family/patient expects to be discharged to:: Assisted living               Home Equipment: Walker - 2 wheels;Walker - 4 wheels;Tub bench;Bedside commode;Wheelchair - power      Prior Function Level of Independence: Needs assistance   Gait / Transfers Assistance Needed: Uses RW for walking within room; uses electric w/c for walking to dining hall. Reports no falls.  ADL's / Homemaking Assistance Needed: Dresses self, gets assist for  getting into/out of shower.        Hand Dominance   Dominant Hand: Right    Extremity/Trunk Assessment   Upper Extremity Assessment Upper Extremity Assessment: Defer to OT evaluation    Lower Extremity Assessment Lower Extremity Assessment: Overall WFL for tasks assessed       Communication   Communication: No difficulties  Cognition Arousal/Alertness:  Awake/alert Behavior During Therapy: WFL for tasks assessed/performed Overall Cognitive Status: Within Functional Limits for tasks assessed                                        General Comments General comments (skin integrity, edema, etc.): HR in A-fib, up to 132 bpm with activity. Sp02 remained in high 90s on RA.    Exercises     Assessment/Plan    PT Assessment Patient needs continued PT services  PT Problem List Decreased mobility;Decreased balance;Decreased activity tolerance;Cardiopulmonary status limiting activity       PT Treatment Interventions Therapeutic exercise;Patient/family education;Therapeutic activities;Functional mobility training    PT Goals (Current goals can be found in the Care Plan section)  Acute Rehab PT Goals Patient Stated Goal: to be able to breathe and go home PT Goal Formulation: With patient Time For Goal Achievement: 02/05/21 Potential to Achieve Goals: Good    Frequency Min 3X/week   Barriers to discharge Decreased caregiver support      Co-evaluation               AM-PAC PT "6 Clicks" Mobility  Outcome Measure Help needed turning from your back to your side while in a flat bed without using bedrails?: A Little Help needed moving from lying on your back to sitting on the side of a flat bed without using bedrails?: A Little Help needed moving to and from a bed to a chair (including a wheelchair)?: A Little Help needed standing up from a chair using your arms (e.g., wheelchair or bedside chair)?: A Little Help needed to walk in hospital room?: A Little Help needed climbing 3-5 steps with a railing? : A Little 6 Click Score: 18    End of Session Equipment Utilized During Treatment: Gait belt Activity Tolerance: Patient tolerated treatment well Patient left: in bed;with call bell/phone within reach;with bed alarm set Nurse Communication: Mobility status PT Visit Diagnosis: Unsteadiness on feet (R26.81);Difficulty  in walking, not elsewhere classified (R26.2)    Time: 1411-1430 PT Time Calculation (min) (ACUTE ONLY): 19 min   Charges:   PT Evaluation $PT Eval Moderate Complexity: 1 Mod          [name], PT, DPT Acute Rehabilitation Services Pager [phone] Office [place] 01/22/2021, 3:16 PM

## 2021-01-22 NOTE — Progress Notes (Signed)
PROGRESS NOTE  [name] RITCHEY WNI:[id number] DOB: [birth year] DOA: 01/20/2021 PCP: [name], MD  HPI/Recap of past 24 hours: [name] [name] is an 87 y.o. male with medical history significant of coronary artery disease, paroxysmal atrial fibrillation on Eliquis, chronic combined diastolic and systolic CHF EF 40 to 38%, CKD 4, hypertension, hypothyroidism, hyperlipidemia, post right upper quadrant ileostomy, who presented to the ED from SNF due to worsening dyspnea and bilateral lower extremity edema.  Work-up revealed acute on chronic combined diastolic and systolic CHF.  Admitted TRH.  Cardiology following, ongoing IV diuresing.  Hospital course complicated by A. fib with RVR.  Seen by cardiology recommended to stop amiodarone and to continue p.o. Lopressor as well as Eliquis for primary CVA prevention.  01/22/21: Patient was seen and examined at his bedside.  He denies having any chest pain however he admits to persistent dyspnea with minimal exertion.  Assessment/Plan: Principal Problem:   Chronic combined systolic and diastolic CHF (congestive heart failure) ([name]) Active Problems:   CAD (coronary artery disease)   Diabetes mellitus with diabetic nephropathy without long-term current use of insulin ([name])   Acute renal failure superimposed on stage 3 chronic kidney disease ([name])   GERD (gastroesophageal reflux disease)   HTN (hypertension)   Paroxysmal atrial fibrillation ([name])  Acute on chronic combined diastolic and systolic CHF Last 2D echo done on 03/14/2018 revealed LVEF 40 to 45% with grade 1 diastolic dysfunction Repeated 2D echo ordered, pending, follow results. Continue strict I's and O's and daily weight Continue p.o. Lopressor 25 mg twice daily as recommended by cardiology. Continue IV diuresing Lasix 120 mg twice daily, monitor renal function and electrolytes, replete electrolytes as indicated.  Paroxysmal A. fib with RVR Rate controlled on p.o. Lopressor 25 mg twice  daily Continue Eliquis for primary CVA prevention Continue to monitor on telemetry Amiodarone has been DC'd by cardiology.  CKD 4 Appears to be at his baseline creatinine 2.7 with GFR of 22 Presented with creatinine of 2.9 with GFR of 20 Avoid nephrotoxic agents Monitor urine output Repeat renal panel in the morning.  Leukocytosis suspect reactive in the setting of acute illness Presented with WBC 13 point 1K WBC is downtrending with no antibiotics Afebrile, non toxic appearing Continue to monitor off antibiotics.  Hyperlipidemia Continue home Lipitor 40 mg daily.  Type 2 diabetes with hyperglycemia Hemoglobin A1c 6.6 on 01/22/2019 Continue insulin sliding scale.  GERD Continue home PPI twice daily.    Code Status: DNR  Family Communication: None at bedside.  Disposition Plan: Likely will discharge to SNF, patient came from SNF   Consultants:  Cardiology  Procedures:  None  Antimicrobials:  None.  DVT prophylaxis: Eliquis  Status is: Inpatient    Dispo: The patient is from: SNF              Anticipated d/c is to: SNF on 05/26/2021 or when cardiology signs off.               Patient currently not stable for discharge due to ongoing IV diuresing.    Difficult to place patient: Not applicable.        Objective: Vitals:   01/21/21 2333 01/22/21 0535 01/22/21 0600 01/22/21 0732  BP: (!) 81/47 103/71  107/70  Pulse: 77 85  95  Resp: 18 18  18   Temp: (!) 97.4 F (36.3 C) 98.2 F (36.8 C)  98 F (36.7 C)  TempSrc:      SpO2: 97% 93%  100%  Weight:   112.6 kg   Height:        Intake/Output Summary (Last 24 hours) at 01/22/2021 1018 Last data filed at 01/22/2021 5427 Gross per 24 hour  Intake 420 ml  Output 1950 ml  Net -1530 ml   Filed Weights   01/20/21 1635 01/21/21 1400 01/22/21 0600  Weight: 113.9 kg 114.2 kg 112.6 kg    Exam:  . General: 87 y.o. year-old male well developed well nourished in no acute distress.  Alert and oriented  x3. . Cardiovascular: Irregular rate and rhythm with no rubs or gallops.  No thyromegaly or JVD noted.   Marland Kitchen Respiratory: Mild rales at bases no wheezing noted.  Good inspiratory effort.   . Abdomen: Soft nontender nondistended with normal bowel sounds x4 quadrants. . Musculoskeletal: 1+ pitting edema in lower extremities bilaterally.  . Skin: No ulcerative lesions noted or rashes, . Psychiatry: Mood is appropriate for condition and setting   Data Reviewed: CBC: Recent Labs  Lab 01/20/21 1731 01/21/21 0234  WBC 13.1* 11.2*  NEUTROABS 9.8* 8.4*  HGB 10.3* 9.6*  HCT 33.8* 31.1*  MCV 90.6 90.4  PLT 328 062   Basic Metabolic Panel: Recent Labs  Lab 01/20/21 1731 01/21/21 0234 01/22/21 0150  NA 135 137 136  K 4.4 4.4 3.8  CL 106 108 105  CO2 20* 20* 23  GLUCOSE 96 120* 139*  BUN 60* 60* 58*  CREATININE 2.94* 2.88* 2.75*  CALCIUM 8.2* 7.9* 8.0*   GFR: Estimated Creatinine Clearance: 24.7 mL/min (A) (by C-G formula based on SCr of 2.75 mg/dL (H)). Liver Function Tests: Recent Labs  Lab 01/20/21 1731  AST 24  ALT 16  ALKPHOS 155*  BILITOT 0.4  PROT 6.9  ALBUMIN 2.7*   No results for input(s): LIPASE, AMYLASE in the last 168 hours. No results for input(s): AMMONIA in the last 168 hours. Coagulation Profile: No results for input(s): INR, PROTIME in the last 168 hours. Cardiac Enzymes: No results for input(s): CKTOTAL, CKMB, CKMBINDEX, TROPONINI in the last 168 hours. BNP (last 3 results) No results for input(s): PROBNP in the last 8760 hours. HbA1C: Recent Labs    01/21/21 0234  HGBA1C 6.6*   CBG: Recent Labs  Lab 01/21/21 0757 01/21/21 1149 01/21/21 1626 01/21/21 2118 01/22/21 0538  GLUCAP 150* 158* 160* 112* 79   Lipid Profile: No results for input(s): CHOL, HDL, LDLCALC, TRIG, CHOLHDL, LDLDIRECT in the last 72 hours. Thyroid Function Tests: No results for input(s): TSH, T4TOTAL, FREET4, T3FREE, THYROIDAB in the last 72 hours. Anemia Panel: No  results for input(s): VITAMINB12, FOLATE, FERRITIN, TIBC, IRON, RETICCTPCT in the last 72 hours. Urine analysis:    Component Value Date/Time   COLORURINE YELLOW 07/26/2018 1148   APPEARANCEUR HAZY (A) 07/26/2018 1148   LABSPEC 1.009 07/26/2018 1148   PHURINE 5.0 07/26/2018 1148   GLUCOSEU NEGATIVE 07/26/2018 1148   HGBUR LARGE (A) 07/26/2018 1148   BILIRUBINUR NEGATIVE 07/26/2018 [place] 07/26/2018 1148   PROTEINUR NEGATIVE 07/26/2018 1148   UROBILINOGEN 0.2 10/22/2014 1052   NITRITE NEGATIVE 07/26/2018 1148   LEUKOCYTESUR LARGE (A) 07/26/2018 1148   Sepsis Labs: @LABRCNTIP (procalcitonin:4,lacticidven:4)  ) Recent Results (from the past 240 hour(s))  SARS CORONAVIRUS 2 (TAT 6-24 HRS) Nasopharyngeal Nasopharyngeal Swab     Status: None   Collection Time: 01/20/21  4:35 PM   Specimen: Nasopharyngeal Swab  Result Value Ref Range Status   SARS Coronavirus 2 NEGATIVE NEGATIVE Final    Comment: (NOTE) SARS-CoV-2  target nucleic acids are NOT DETECTED.  The SARS-CoV-2 RNA is generally detectable in upper and lower respiratory specimens during the acute phase of infection. Negative results do not preclude SARS-CoV-2 infection, do not rule out co-infections with other pathogens, and should not be used as the sole basis for treatment or other patient management decisions. Negative results must be combined with clinical observations, patient history, and epidemiological information. The expected result is Negative.  Fact Sheet for Patients: SugarRoll.be  Fact Sheet for Healthcare Providers: https://www.woods-mathews.com/  This test is not yet approved or cleared by the [place] FDA and  has been authorized for detection and/or diagnosis of SARS-CoV-2 by FDA under an Emergency Use Authorization (EUA). This EUA will remain  in effect (meaning this test can be used) for the duration of the COVID-19 declaration under Se  ction 564(b)(1) of the Act, 21 U.S.C. section 360bbb-3(b)(1), unless the authorization is terminated or revoked sooner.  Performed at [facility], [place] [address] [place], [place] [address]       Studies: No results found.  Scheduled Meds: . apixaban  2.5 mg Oral BID  . atorvastatin  40 mg Oral q1800  . insulin aspart  0-5 Units Subcutaneous QHS  . insulin aspart  0-9 Units Subcutaneous TID WC  . metoprolol tartrate  25 mg Oral BID  . pantoprazole  40 mg Oral BID  . potassium chloride  30 mEq Oral BID  . sodium chloride flush  3 mL Intravenous Q12H    Continuous Infusions: . sodium chloride    . furosemide 120 mg (01/22/21 0825)     LOS: 2 days     Kayleen Memos, MD [facility] Pager [phone]  If 7PM-7AM, please contact night-coverage [email or web address] Password [facility] 01/22/2021, 10:18 AM

## 2021-01-23 ENCOUNTER — Inpatient Hospital Stay (HOSPITAL_COMMUNITY): Payer: Medicare Other

## 2021-01-23 DIAGNOSIS — I5042 Chronic combined systolic (congestive) and diastolic (congestive) heart failure: Secondary | ICD-10-CM | POA: Diagnosis not present

## 2021-01-23 LAB — BASIC METABOLIC PANEL
Anion gap: 10 (ref 5–15)
Anion gap: 9 (ref 5–15)
BUN: 72 mg/dL — ABNORMAL HIGH (ref 8–23)
BUN: 71 mg/dL — ABNORMAL HIGH (ref 8–23)
CO2: 22 mmol/L (ref 22–32)
CO2: 22 mmol/L (ref 22–32)
Calcium: 8.1 mg/dL — ABNORMAL LOW (ref 8.9–10.3)
Calcium: 8.2 mg/dL — ABNORMAL LOW (ref 8.9–10.3)
Chloride: 103 mmol/L (ref 98–111)
Chloride: 102 mmol/L (ref 98–111)
Creatinine, Ser: 3.3 mg/dL — ABNORMAL HIGH (ref 0.61–1.24)
Creatinine, Ser: 3.33 mg/dL — ABNORMAL HIGH (ref 0.61–1.24)
GFR, Estimated: 18 mL/min — ABNORMAL LOW (ref >60)
GFR, Estimated: 17 mL/min — ABNORMAL LOW (ref >60)
Glucose, Bld: 111 mg/dL — ABNORMAL HIGH (ref 70–99)
Glucose, Bld: 227 mg/dL — ABNORMAL HIGH (ref 70–99)
Potassium: 4.7 mmol/L (ref 3.5–5.1)
Potassium: 5 mmol/L (ref 3.5–5.1)
Sodium: 135 mmol/L (ref 135–145)
Sodium: 133 mmol/L — ABNORMAL LOW (ref 135–145)

## 2021-01-23 LAB — GLUCOSE, CAPILLARY
Glucose-Capillary: 109 mg/dL — ABNORMAL HIGH (ref 70–99)
Glucose-Capillary: 148 mg/dL — ABNORMAL HIGH (ref 70–99)
Glucose-Capillary: 157 mg/dL — ABNORMAL HIGH (ref 70–99)
Glucose-Capillary: 137 mg/dL — ABNORMAL HIGH (ref 70–99)

## 2021-01-23 LAB — SARS CORONAVIRUS 2 (TAT 6-24 HRS): SARS Coronavirus 2: NEGATIVE

## 2021-01-23 LAB — TROPONIN I (HIGH SENSITIVITY): Troponin I (High Sensitivity): 22 ng/L — ABNORMAL HIGH (ref <18)

## 2021-01-23 MED ORDER — SODIUM ZIRCONIUM CYCLOSILICATE 5 G PO PACK
5.0000 g | PACK | Freq: Once | ORAL | Status: AC
  Administered 2021-01-23: 5 g via ORAL
  Filled 2021-01-23: qty 1

## 2021-01-23 MED ORDER — ALBUMIN HUMAN 5 % IV SOLN
25.0000 g | Freq: Four times a day (QID) | INTRAVENOUS | Status: AC
  Administered 2021-01-23 – 2021-01-24 (×2): 25 g via INTRAVENOUS
  Filled 2021-01-23 (×2): qty 500

## 2021-01-23 MED ORDER — ALBUMIN HUMAN 5 % IV SOLN
25.0000 g | Freq: Four times a day (QID) | INTRAVENOUS | Status: DC
  Filled 2021-01-23 (×2): qty 500

## 2021-01-23 MED ORDER — LACTATED RINGERS IV BOLUS
500.0000 mL | Freq: Once | INTRAVENOUS | Status: AC
  Administered 2021-01-23: 500 mL via INTRAVENOUS

## 2021-01-23 NOTE — Progress Notes (Addendum)
PROGRESS NOTE  HARLAN ERVINE QJJ:[id number] DOB: 05-11-1933 DOA: 01/20/2021 PCP: [name], MD  HPI/Recap of past 24 hours: [name] [name] is an 87 y.o. male with medical history significant of coronary artery disease, paroxysmal atrial fibrillation on Eliquis, chronic combined diastolic and systolic CHF EF 40 to 48%, CKD 4, hypertension, hypothyroidism, hyperlipidemia, post right upper quadrant ileostomy, who presented to the ED from SNF due to worsening dyspnea and bilateral lower extremity edema.  Work-up revealed acute on chronic combined diastolic and systolic CHF.  Admitted TRH.  Cardiology following, ongoing IV diuresing.  Hospital course complicated by A. fib with RVR.  Seen by cardiology recommended to stop amiodarone and to continue p.o. Lopressor as well as Eliquis for primary CVA prevention.  01/23/21: Seen and examined at bedside.  Off oxygen supplementation.  Breathing is improved.  Hypotensive yesterday late in the day requiring midodrine 10 mg 3 times daily.  IV Lasix has been held by cardiology.  Assessment/Plan: Principal Problem:   Chronic combined systolic and diastolic CHF (congestive heart failure) ([name]) Active Problems:   CAD (coronary artery disease)   Diabetes mellitus with diabetic nephropathy without long-term current use of insulin ([name])   Acute renal failure superimposed on stage 3 chronic kidney disease ([name])   GERD (gastroesophageal reflux disease)   HTN (hypertension)   Paroxysmal atrial fibrillation ([name])  Acute on chronic combined diastolic and systolic CHF Last 2D echo done on 03/14/2018 revealed LVEF 40 to 45% with grade 1 diastolic dysfunction Repeated 2D echo done on 01/22/2021 revealed LVEF 20 to 25%, left ventricle demonstrate global hypokinesis.  Moderate mitral valve regurgitation. Beta-blocker on hold, hypotensive despite midodrine 10 mg 3 times daily. IV diuretics held by cardiology. Continue strict I's and O's and daily weight Net I&O -1.3  L Management per cardiology.  Elevated troponin, suspect demand ischemia Troponin peaked at 22 12 EKG ordered on 01/23/2021. Management per cardiology Continue to monitor on telemetry  Hypotension on midodrine Continue midodrine 10 mg 3 times daily Add IV albumin 25 g every 6 hours x2 doses. Maintain MAP greater than 65. Closely monitor vital signs.  Mild hyperkalemia in the setting of AKI on CKD 4 Serum potassium 5.0 Give a dose of Lokelma Repeat renal panel in the morning.  Paroxysmal A. fib with RVR Currently rate controlled Beta-blocker on hold due to soft blood pressures Continue Eliquis for primary CVA prevention Continue to monitor on telemetry Amiodarone has been DC'd by cardiology.  AKI on CKD 4 suspect cardiorenal Baseline creatinine appears to be 2.7 with GFR of 22. Creatinine uptrending 3.33 with GFR of 17 Closely monitor urine output Last urine output 875 cc recorded in the last 24 hours. Avoid nephrotoxic agents and hypotension Repeat renal panel in the morning If no improvement of renal function after IV albumin will consult nephrology.  Leukocytosis suspect reactive in the setting of acute illness Presented with WBC 13 point 1K, downtrended to 11 point 2K. Normal antibiotics. Afebrile, non toxic appearing Continue to monitor off antibiotics.  Hyperlipidemia Continue home Lipitor 40 mg daily.  Type 2 diabetes with hyperglycemia Hemoglobin A1c 6.6 on 01/21/2021 Continue insulin sliding scale.  GERD Continue home PPI twice daily.    Code Status: DNR  Family Communication: None at bedside.  Disposition Plan: Likely will discharge to ALF, patient came from ALF   Consultants:  Cardiology  Nephrology.  Procedures:  None  Antimicrobials:  None.  DVT prophylaxis: Eliquis  Status is: Inpatient    Dispo: The patient  is from: ALF              Anticipated d/c is to: ALF on 01/25/2021 or when cardiology and nephrology sign off.                Patient currently not stable for discharge due to hypotension, worsening renal function.   Difficult to place patient: Not applicable.        Objective: Vitals:   01/23/21 0338 01/23/21 0636 01/23/21 0921 01/23/21 1148  BP: (!) 86/61 95/63 (!) 91/52 (!) 87/56  Pulse: 84 81 72 (!) 57  Resp: 20 19 18 18   Temp: (!) 97.5 F (36.4 C) (!) 97.5 F (36.4 C) 98.1 F (36.7 C) (!) 97.5 F (36.4 C)  TempSrc: Oral Oral Oral Oral  SpO2: 95% 97% 96% 96%  Weight: 112.5 kg     Height:        Intake/Output Summary (Last 24 hours) at 01/23/2021 1738 Last data filed at 01/23/2021 1247 Gross per 24 hour  Intake 1754.9 ml  Output 1025 ml  Net 729.9 ml   Filed Weights   01/21/21 1400 01/22/21 0600 01/23/21 0338  Weight: 114.2 kg 112.6 kg 112.5 kg    Exam:  . General: 87 y.o. year-old male frail-appearing in no acute distress.  He is alert oriented x3.   . Cardiovascular: Irregular rate and rhythm no rubs or gallops.   Marland Kitchen Respiratory: Clear to auscultation no wheezes or rales.  Poor inspiratory effort.   . Abdomen: Soft obese bowel sounds present.  Nontender.  . Musculoskeletal: Trace lower extremity edema bilaterally.   . Skin: No ulcerative lesions noted. Marland Kitchen Psychiatry: Mood is appropriate for condition and setting.   Data Reviewed: CBC: Recent Labs  Lab 01/20/21 1731 01/21/21 0234  WBC 13.1* 11.2*  NEUTROABS 9.8* 8.4*  HGB 10.3* 9.6*  HCT 33.8* 31.1*  MCV 90.6 90.4  PLT 328 836   Basic Metabolic Panel: Recent Labs  Lab 01/20/21 1731 01/21/21 0234 01/22/21 0150 01/23/21 0332 01/23/21 0943  NA 135 137 136 135 133*  K 4.4 4.4 3.8 4.7 5.0  CL 106 108 105 103 102  CO2 20* 20* 23 22 22   GLUCOSE 96 120* 139* 111* 227*  BUN 60* 60* 58* 72* 71*  CREATININE 2.94* 2.88* 2.75* 3.30* 3.33*  CALCIUM 8.2* 7.9* 8.0* 8.1* 8.2*   GFR: Estimated Creatinine Clearance: 20.4 mL/min (A) (by C-G formula based on SCr of 3.33 mg/dL (H)). Liver Function Tests: Recent Labs  Lab  01/20/21 1731  AST 24  ALT 16  ALKPHOS 155*  BILITOT 0.4  PROT 6.9  ALBUMIN 2.7*   No results for input(s): LIPASE, AMYLASE in the last 168 hours. No results for input(s): AMMONIA in the last 168 hours. Coagulation Profile: No results for input(s): INR, PROTIME in the last 168 hours. Cardiac Enzymes: No results for input(s): CKTOTAL, CKMB, CKMBINDEX, TROPONINI in the last 168 hours. BNP (last 3 results) No results for input(s): PROBNP in the last 8760 hours. HbA1C: Recent Labs    01/21/21 0234  HGBA1C 6.6*   CBG: Recent Labs  Lab 01/22/21 1608 01/22/21 2105 01/23/21 0605 01/23/21 1101 01/23/21 1540  GLUCAP 140* 150* 109* 148* 157*   Lipid Profile: No results for input(s): CHOL, HDL, LDLCALC, TRIG, CHOLHDL, LDLDIRECT in the last 72 hours. Thyroid Function Tests: No results for input(s): TSH, T4TOTAL, FREET4, T3FREE, THYROIDAB in the last 72 hours. Anemia Panel: No results for input(s): VITAMINB12, FOLATE, FERRITIN, TIBC, IRON, RETICCTPCT in the last  72 hours. Urine analysis:    Component Value Date/Time   COLORURINE YELLOW 07/26/2018 1148   APPEARANCEUR HAZY (A) 07/26/2018 1148   LABSPEC 1.009 07/26/2018 1148   PHURINE 5.0 07/26/2018 1148   GLUCOSEU NEGATIVE 07/26/2018 1148   HGBUR LARGE (A) 07/26/2018 1148   BILIRUBINUR NEGATIVE 07/26/2018 [place] 07/26/2018 1148   PROTEINUR NEGATIVE 07/26/2018 1148   UROBILINOGEN 0.2 10/22/2014 1052   NITRITE NEGATIVE 07/26/2018 1148   LEUKOCYTESUR LARGE (A) 07/26/2018 1148   Sepsis Labs: @LABRCNTIP (procalcitonin:4,lacticidven:4)  ) Recent Results (from the past 240 hour(s))  SARS CORONAVIRUS 2 (TAT 6-24 HRS) Nasopharyngeal Nasopharyngeal Swab     Status: None   Collection Time: 01/20/21  4:35 PM   Specimen: Nasopharyngeal Swab  Result Value Ref Range Status   SARS Coronavirus 2 NEGATIVE NEGATIVE Final    Comment: (NOTE) SARS-CoV-2 target nucleic acids are NOT DETECTED.  The SARS-CoV-2 RNA is  generally detectable in upper and lower respiratory specimens during the acute phase of infection. Negative results do not preclude SARS-CoV-2 infection, do not rule out co-infections with other pathogens, and should not be used as the sole basis for treatment or other patient management decisions. Negative results must be combined with clinical observations, patient history, and epidemiological information. The expected result is Negative.  Fact Sheet for Patients: SugarRoll.be  Fact Sheet for Healthcare Providers: https://www.woods-mathews.com/  This test is not yet approved or cleared by the [place] FDA and  has been authorized for detection and/or diagnosis of SARS-CoV-2 by FDA under an Emergency Use Authorization (EUA). This EUA will remain  in effect (meaning this test can be used) for the duration of the COVID-19 declaration under Se ction 564(b)(1) of the Act, 21 U.S.C. section 360bbb-3(b)(1), unless the authorization is terminated or revoked sooner.  Performed at [facility], [place] [address], [place], [place] [address]   SARS CORONAVIRUS 2 (TAT 6-24 HRS) Nasopharyngeal Nasopharyngeal Swab     Status: None   Collection Time: 01/23/21  4:42 AM   Specimen: Nasopharyngeal Swab  Result Value Ref Range Status   SARS Coronavirus 2 NEGATIVE NEGATIVE Final    Comment: (NOTE) SARS-CoV-2 target nucleic acids are NOT DETECTED.  The SARS-CoV-2 RNA is generally detectable in upper and lower respiratory specimens during the acute phase of infection. Negative results do not preclude SARS-CoV-2 infection, do not rule out co-infections with other pathogens, and should not be used as the sole basis for treatment or other patient management decisions. Negative results must be combined with clinical observations, patient history, and epidemiological information. The expected result is Negative.  Fact Sheet for  Patients: SugarRoll.be  Fact Sheet for Healthcare Providers: https://www.woods-mathews.com/  This test is not yet approved or cleared by the [place] FDA and  has been authorized for detection and/or diagnosis of SARS-CoV-2 by FDA under an Emergency Use Authorization (EUA). This EUA will remain  in effect (meaning this test can be used) for the duration of the COVID-19 declaration under Se ction 564(b)(1) of the Act, 21 U.S.C. section 360bbb-3(b)(1), unless the authorization is terminated or revoked sooner.  Performed at [facility], [place] [address] [place], [place] [address]       Studies: DG Chest 1 View  Result Date: 01/23/2021 CLINICAL DATA:  87 year old male with history of shortness of breath. EXAM: CHEST  1 VIEW COMPARISON:  Chest x-ray 01/21/2019. FINDINGS: Lung volumes are normal. Study is under penetrated, limiting the diagnostic sensitivity and specificity of the examination.  With these limitations in mind, there are bibasilar opacities which are favored to be technique related or potentially from underlying subsegmental atelectasis, however, airspace consolidation is difficult to entirely exclude, particularly in the left lower lobe. No pleural effusions. No pneumothorax. No suspicious appearing pulmonary nodules or masses are noted. Pulmonary vasculature is engorged, without frank pulmonary edema. Moderate cardiomegaly, similar to the prior study. Upper mediastinal contours are within normal limits. Aortic atherosclerosis. IMPRESSION: 1. Cardiomegaly with pulmonary venous congestion, but no frank pulmonary edema. 2. Probable subsegmental atelectasis in the lung bases. 3. Aortic atherosclerosis. Electronically Signed   By: Vinnie Langton M.D.   On: 01/23/2021 06:12    Scheduled Meds: . apixaban  2.5 mg Oral BID  . atorvastatin  40 mg Oral q1800  . insulin aspart  0-5 Units Subcutaneous QHS  . insulin aspart  0-9 Units  Subcutaneous TID WC  . metoprolol tartrate  12.5 mg Oral BID  . midodrine  10 mg Oral TID WC  . pantoprazole  40 mg Oral BID  . sodium chloride flush  3 mL Intravenous Q12H    Continuous Infusions: . sodium chloride       LOS: 3 days     Kayleen Memos, MD [facility] Pager [phone]  If 7PM-7AM, please contact night-coverage [email or web address] Password [facility] 01/23/2021, 5:38 PM

## 2021-01-23 NOTE — TOC Progression Note (Addendum)
Transition of Care [facility]) - Progression Note    Patient Details  Name: [name] [name] MRN: [id number] Date of Birth: 08-08-1933  Transition of [facility]) CM/SW Contact  Ina Homes, [place] Phone Number: 01/23/2021, 9:52 AM  Clinical Narrative:     SW spoke with Helene Kelp ([facility] [phone]) reports did not receive FL2, requested re-faxed to: [phone]. SW re-faxed at 9:40am  Update 1005am  SW received call from [place] ([facility]) reports she received FL2 but requesting d/c summary to check for any med changes as facility pharmacy not open on weekend.   SW requested d/c summary from Dr. Nevada Crane   Expected Discharge Plan: Assisted Living Barriers to Discharge: Continued Medical Work up  Expected Discharge Plan and Services Expected Discharge Plan: Assisted Living In-house Referral: Clinical Social Work Discharge Planning Services: Other - See comment (CSW) Post Acute Care Choice:  (ALF) Living arrangements for the past 2 months: Assisted Living Facility                                       Social Determinants of Health (SDOH) Interventions    Readmission Risk Interventions No flowsheet data found.

## 2021-01-23 NOTE — Progress Notes (Signed)
Progress Note  Patient Name: [name] [name] Date of Encounter: 01/23/2021  [facility] Cardiologist: [name], MD   Subjective   Improved up eating breakfast   Inpatient Medications    Scheduled Meds: . apixaban  2.5 mg Oral BID  . atorvastatin  40 mg Oral q1800  . insulin aspart  0-5 Units Subcutaneous QHS  . insulin aspart  0-9 Units Subcutaneous TID WC  . metoprolol tartrate  12.5 mg Oral BID  . midodrine  10 mg Oral TID WC  . pantoprazole  40 mg Oral BID  . potassium chloride  30 mEq Oral BID  . sodium chloride flush  3 mL Intravenous Q12H   Continuous Infusions: . sodium chloride    . furosemide 120 mg (01/23/21 0812)   PRN Meds: sodium chloride, acetaminophen, ondansetron (ZOFRAN) IV, sodium chloride flush   Vital Signs    Vitals:   01/22/21 1713 01/22/21 1959 01/23/21 0338 01/23/21 0636  BP: 107/80 (!) 86/62 (!) 86/61 95/63  Pulse:  60 84 81  Resp:  19 20 19   Temp:  98 F (36.7 C) (!) 97.5 F (36.4 C) (!) 97.5 F (36.4 C)  TempSrc:  Oral Oral Oral  SpO2: 98% 97% 95% 97%  Weight:   112.5 kg   Height:        Intake/Output Summary (Last 24 hours) at 01/23/2021 0818 Last data filed at 01/23/2021 0600 Gross per 24 hour  Intake 1377.9 ml  Output 1275 ml  Net 102.9 ml   Last 3 Weights 01/23/2021 01/22/2021 01/21/2021  Weight (lbs) 248 lb 248 lb 3.8 oz 251 lb 12.3 oz  Weight (kg) 112.492 kg 112.6 kg 114.2 kg      Telemetry    Afib at rate of 100s - Personally Reviewed 01/23/2021   ECG    N/A  Physical Exam   Obese No distress Basilar crackles Abdomen soft Distant heart sounds Plus one LE edema   Labs    High Sensitivity Troponin:   Recent Labs  Lab 01/20/21 1731 01/20/21 1926 01/23/21 0332  TROPONINIHS 20* 19* 22*      Chemistry Recent Labs  Lab 01/20/21 1731 01/21/21 0234 01/22/21 0150 01/23/21 0332  NA 135 137 136 135  K 4.4 4.4 3.8 4.7  CL 106 108 105 103  CO2 20* 20* 23 22  GLUCOSE 96 120* 139* 111*  BUN 60* 60* 58*  72*  CREATININE 2.94* 2.88* 2.75* 3.30*  CALCIUM 8.2* 7.9* 8.0* 8.1*  PROT 6.9  --   --   --   ALBUMIN 2.7*  --   --   --   AST 24  --   --   --   ALT 16  --   --   --   ALKPHOS 155*  --   --   --   BILITOT 0.4  --   --   --   GFRNONAA 20* 21* 22* 18*  ANIONGAP 9 9 8 10      Hematology Recent Labs  Lab 01/20/21 1731 01/21/21 0234  WBC 13.1* 11.2*  RBC 3.73* 3.44*  HGB 10.3* 9.6*  HCT 33.8* 31.1*  MCV 90.6 90.4  MCH 27.6 27.9  MCHC 30.5 30.9  RDW 14.6 14.6  PLT 328 295    BNP Recent Labs  Lab 01/20/21 1731  BNP 705.9*     Radiology    DG Chest 1 View  Result Date: 01/23/2021 CLINICAL DATA:  87 year old male with history of shortness of breath. EXAM:  CHEST  1 VIEW COMPARISON:  Chest x-ray 01/21/2019. FINDINGS: Lung volumes are normal. Study is under penetrated, limiting the diagnostic sensitivity and specificity of the examination. With these limitations in mind, there are bibasilar opacities which are favored to be technique related or potentially from underlying subsegmental atelectasis, however, airspace consolidation is difficult to entirely exclude, particularly in the left lower lobe. No pleural effusions. No pneumothorax. No suspicious appearing pulmonary nodules or masses are noted. Pulmonary vasculature is engorged, without frank pulmonary edema. Moderate cardiomegaly, similar to the prior study. Upper mediastinal contours are within normal limits. Aortic atherosclerosis. IMPRESSION: 1. Cardiomegaly with pulmonary venous congestion, but no frank pulmonary edema. 2. Probable subsegmental atelectasis in the lung bases. 3. Aortic atherosclerosis. Electronically Signed   By: Vinnie Langton M.D.   On: 01/23/2021 06:12   ECHOCARDIOGRAM COMPLETE  Result Date: 01/22/2021    ECHOCARDIOGRAM REPORT   Patient Name:   [name] [name] Date of Exam: 01/22/2021 Medical Rec #:  [id number]     Height:       70.0 in Accession #:    [id number]    Weight:       248.2 lb Date of Birth:   04/23/1933     BSA:          2.288 m Patient Age:    87 years      BP:           108/73 mmHg Patient Gender: M             HR:           87 bpm. Exam Location:  Inpatient Procedure: 2D Echo, Cardiac Doppler, Color Doppler and Intracardiac            Opacification Agent Indications:    CHF-Acute Systolic J09.32  History:        Patient has prior history of Echocardiogram examinations, most                 recent 03/14/2018. CAD, Arrythmias:Atrial Fibrillation; Risk                 Factors:Hypertension and Diabetes. Chronic kidney disease.  Sonographer:    Darlina Sicilian RDCS Referring Phys: [place]  1. Left ventricular ejection fraction, by estimation, is 20 to 25%. The left ventricle has severely decreased function. The left ventricle demonstrates global hypokinesis. There is mild left ventricular hypertrophy. Left ventricular diastolic parameters  are indeterminate.  2. Right ventricular systolic function is moderately reduced. The right ventricular size is mildly enlarged.  3. The mitral valve is normal in structure. Moderate mitral valve regurgitation.  4. The aortic valve is tricuspid. Aortic valve regurgitation is trivial. Mild to moderate aortic valve sclerosis/calcification is present, without any evidence of aortic stenosis. FINDINGS  Left Ventricle: Left ventricular ejection fraction, by estimation, is 20 to 25%. The left ventricle has severely decreased function. The left ventricle demonstrates global hypokinesis. Definity contrast agent was given IV to delineate the left ventricular endocardial borders. The left ventricular internal cavity size was normal in size. There is mild left ventricular hypertrophy. Left ventricular diastolic parameters are indeterminate. Right Ventricle: The right ventricular size is mildly enlarged. Right vetricular wall thickness was not well visualized. Right ventricular systolic function is moderately reduced. Left Atrium: Left atrial size was normal  in size. Right Atrium: Right atrial size was normal in size. Pericardium: There is no evidence of pericardial effusion. Mitral Valve: The mitral valve is normal in structure. Moderate  mitral valve regurgitation. MV peak gradient, 5.3 mmHg. The mean mitral valve gradient is 3.0 mmHg. Tricuspid Valve: The tricuspid valve is normal in structure. Tricuspid valve regurgitation is mild. Aortic Valve: The aortic valve is tricuspid. Aortic valve regurgitation is trivial. Mild to moderate aortic valve sclerosis/calcification is present, without any evidence of aortic stenosis. Aortic valve mean gradient measures 3.0 mmHg. Aortic valve peak  gradient measures 5.4 mmHg. Aortic valve area, by VTI measures 2.40 cm. Pulmonic Valve: The pulmonic valve was not well visualized. Pulmonic valve regurgitation is mild. Aorta: The aortic root and ascending aorta are structurally normal, with no evidence of dilitation. IAS/Shunts: The interatrial septum was not well visualized.  LEFT VENTRICLE PLAX 2D LVIDd:         4.70 cm LVIDs:         4.40 cm LV PW:         1.10 cm LV IVS:        1.10 cm LVOT diam:     2.30 cm LV SV:         52 LV SV Index:   23 LVOT Area:     4.15 cm  LV Volumes (MOD) LV vol d, MOD A2C: 224.0 ml LV vol d, MOD A4C: 193.0 ml LV vol s, MOD A2C: 145.0 ml LV vol s, MOD A4C: 170.0 ml LV SV MOD A2C:     79.0 ml LV SV MOD A4C:     193.0 ml LV SV MOD BP:      40.9 ml RIGHT VENTRICLE RV S prime:     9.90 cm/s TAPSE (M-mode): 1.5 cm LEFT ATRIUM             Index       RIGHT ATRIUM           Index LA diam:        4.50 cm 1.97 cm/m  RA Area:     21.20 cm LA Vol (A2C):   61.4 ml 26.83 ml/m RA Volume:   65.00 ml  28.41 ml/m LA Vol (A4C):   55.6 ml 24.30 ml/m LA Biplane Vol: 59.5 ml 26.00 ml/m  AORTIC VALVE AV Area (Vmax):    2.70 cm AV Area (Vmean):   2.57 cm AV Area (VTI):     2.40 cm AV Vmax:           116.00 cm/s AV Vmean:          80.100 cm/s AV VTI:            0.216 m AV Peak Grad:      5.4 mmHg AV Mean Grad:       3.0 mmHg LVOT Vmax:         75.50 cm/s LVOT Vmean:        49.600 cm/s LVOT VTI:          0.125 m LVOT/AV VTI ratio: 0.58  AORTA Ao Root diam: 3.40 cm Ao Asc diam:  3.20 cm MITRAL VALVE                 TRICUSPID VALVE MV Area (PHT): 5.65 cm      TV Peak grad:   24.0 mmHg MV Area VTI:   1.98 cm MV Peak grad:  5.3 mmHg      TR Peak grad:   25.8 mmHg MV Mean grad:  3.0 mmHg      TR Vmax:        254.00 cm/s MV Vmax:  1.15 m/s MV Vmean:      87.6 cm/s     SHUNTS MV Decel Time: 134 msec      Systemic VTI:  0.12 m MR Peak grad:    60.5 mmHg   Systemic Diam: 2.30 cm MR Mean grad:    40.5 mmHg MR Vmax:         389.00 cm/s MR Vmean:        301.0 cm/s MR PISA:         1.57 cm MR PISA Eff ROA: 14 mm MR PISA Radius:  0.50 cm MV E velocity: 97.87 cm/s Oswaldo Milian MD Electronically signed by Oswaldo Milian MD Signature Date/Time: 01/22/2021/5:27:24 PM    Final     Cardiac Studies   Pending echo this admission   Patient Profile     87 y.o. male with a hx of CAD status post DES to the RI and CTO of the LAD, paroxysmal atrial fibrillation/ flutter,  chronic systolic and diastolic CHF, CKD IV, HLD  and hypertension who is being seen for the evaluation of CHF and AFib RVR. TTE wit EF 20-25%  With moderate MR  Assessment & Plan   1. Acute on chronic combined CHF -BNP 705.  CXR this am improved no frank edema increasingly azotemic need to hold diuretics TTE wit EF 20-25% If Cr does not improve with holding lasix may need to think about milrinone although given age and DNR not ideal  2.  Atrial fibrillation/flutter at rapid ventricular rate - on low dose eliquis and beta blocker amiodarone d/c by Dr Marisue Ivan   3. CAD status post DES to the RI and known chronic occlusion of the LAD by cath in 2015 -no angina ASA held due to anticoagulation continue statin and beta blocker   4.  Chronic kidney disease stage III/IV -Cr up to 3.3 hold diuretics   5.  Hypertension - BP low on midodrine   For  questions or updates, please contact [place] Please consult [email or web address] for contact info under        Signed, Jenkins Rouge, MD  01/23/2021, 8:18 AM

## 2021-01-23 NOTE — Consult Note (Addendum)
Renal Service Consult Note [facility]  [name] [name] 01/23/2021 [name] Blazing, MD Requesting Physician: Dr Loletha Grayer. Hall  Reason for Consult: Renal failure HPI: The patient is a 87 y.o. year-old w/ hx of ulcerative colitis, PAF, hx AKI, CKD 3, hx PUD , hx kidney stones, DM2, chronic diast CHF and systolic CHF, CAD sp PCI presented on 4/06 w/ SOB x 1 month. Hx afib w/ uncont HR. +orthopnea, PND, swelling in the legs despite high dose po lasix at home. In ED BP was 96/63, 96% on 2L.  Creat 2.88.  Pt admitted for acute exac of CHF and worsening renal function. Started on IV lasix 7m bid then 120 mg bid. Cardiology consulted. Creat increased to 3.30 on 4/09. Asked to see for renal failure.   Pt seen in room. [place] Wife died 162yrs ago. Usually walks to exercise prior to this health issue. Uses a walker. Balance is "poor".   Past hx of DOE 5-6 yrs ago fixed by "opening up my main heart artery".   Last admit was 2-3 yrs ago per pt, had broken leg which required 2 surgeries.   Has PCP he sees about every 6 mo.  He is DNR. Had his heart shocked once in the past.   Has seen what sounds like urologist in the past for "infection" [facility].  No prior renal notes in system.   No SOB, has purewick catheter. 650 cc uop yest. Net I/O since admit is negative 2300 cc. Getting IV lasix 120 bid.   ROS  denies CP  no joint pain   no HA  no blurry vision  no rash  no diarrhea  no nausea/ vomiting  no dysuria  no difficulty voiding  no change in urine color    Past Medical History  Past Medical History:  Diagnosis Date  . Allergic reaction caused by a drug 11/2017  . Anemia   . Atrial flutter ([place]    a. s/p RFCA of counterclockwise cavo-tricuspid isthmus dependent atrial flutter 2009 by Dr. ARayann Heman  .Marland KitchenCAD (coronary artery disease) 06/2014   a. 06/2014: abnl nuc. Cath: Totally occluded LAD with faint collaterals (not a candidate  for CTO PCI), 90% ramus s/p DES, 50-70% OM2.  . Chronic diastolic CHF (congestive heart failure) ([place]    a. 2D Echo 10/23/13: EF 50-55%, basal mid-inf HK, grade 2 DD, mild MR, mod dilated LA, no sig change from prior.  . CKD (chronic kidney disease), stage III ([place]   . Colostomy in place ([facility]   . Complication of anesthesia    " during my kidney stone surgery my heart went out of rhythm  . Compression fracture of L1 lumbar vertebra ([name])   . DCM (dilated cardiomyopathy) ([place] 02/27/2018   EF 35-40% by TEE 02/2018 felt tachycardia mediated  . Diabetes ([place]    TYPE 2   . Dyslipidemia   . Dyspnea   . Frequent PVCs   . Gastric ulcer   . GERD (gastroesophageal reflux disease)   . History of blood transfusion   . History of kidney stones   . History of peptic ulcer disease   . HTN (hypertension)   . Hx of acute renal failure 01/02/10-3/24-11   due to hypovolemic shock, gastroenteritis and dehydration,hospitalized . Did requre a few days of dialysis. Cr at discharge was 1.8  . Kidney disease   . Kidney stone may 2009  Right hydronephrosis, S/P stone removal  . Lower back pain   . Macular degeneration   . OA (osteoarthritis) of hip   . Obesity   . Osteopenia   . PAF (paroxysmal atrial fibrillation) ([name])    not on long term anticoagulation due to history of heme positive stools and anemia  . Shingles    episode  . Small bowel obstruction, partial ([place]) 2009   Episode  . Ulcerative colitis ([place])    a. s/p total colectomy remotely.   Past Surgical History  Past Surgical History:  Procedure Laterality Date  . BACK SURGERY    . BIOPSY  07/28/2018   Procedure: BIOPSY;  Surgeon: Thornton Park, MD;  Location: [place];  Service: Gastroenterology;;  . CARDIAC CATHETERIZATION  06/19/2014  . CARDIOVERSION N/A 02/23/2018   Procedure: CARDIOVERSION;  Surgeon: Dorothy Spark, MD;  Location: [facility] ENDOSCOPY;  Service: Cardiovascular;  Laterality: N/A;  . COLON RESECTION    .  COLOSTOMY    . CORONARY ANGIOPLASTY    . CORONARY STENT PLACEMENT  06/19/2014   DES       dr Martinique  . ESOPHAGOGASTRODUODENOSCOPY (EGD) WITH PROPOFOL N/A 07/28/2018   Procedure: ESOPHAGOGASTRODUODENOSCOPY (EGD) WITH PROPOFOL;  Surgeon: Thornton Park, MD;  Location: [place];  Service: Gastroenterology;  Laterality: N/A;  . HARDWARE REMOVAL Left 12/01/2017   Procedure: HARDWARE REMOVAL LEFT ANKLE;  Surgeon: Newt Minion, MD;  Location: [place];  Service: Orthopedics;  Laterality: Left;  . HERNIA REPAIR    . LEFT AND RIGHT HEART CATHETERIZATION WITH CORONARY ANGIOGRAM N/A 06/19/2014   Procedure: LEFT AND RIGHT HEART CATHETERIZATION WITH CORONARY ANGIOGRAM;  Surgeon: Peter M Martinique, MD;  Location[facility] C[facility] LAB;  Service: Cardiovascular;  Laterality: N/A;  . ORIF ANKLE FRACTURE Left 10/25/2017   Procedure: OPEN REDUCTION INTERNAL FIXATION (ORIF) LEFT ANKLE FRACTURE;  Surgeon: Newt Minion, MD;  Location: [place];  Service: Orthopedics;  Laterality: Left;  . TEE WITHOUT CARDIOVERSION N/A 02/23/2018   Procedure: TRANSESOPHAGEAL ECHOCARDIOGRAM (TEE);  Surgeon: Dorothy Spark, MD;  Location: [facility] ENDOSCOPY;  Service: Cardiovascular;  Laterality: N/A;  . TONSILLECTOMY     Family History  Family History  Problem Relation Age of Onset  . Colon cancer Mother   . Esophageal cancer Neg Hx   . Stomach cancer Neg Hx   . Pancreatic cancer Neg Hx   . Liver disease Neg Hx    Social History  reports that he has never smoked. He has never used smokeless tobacco. He reports that he does not drink alcohol and does not use drugs. Allergies  Allergies  Allergen Reactions  . Brilinta [Ticagrelor] Shortness Of Breath and Other (See Comments)    Changed to Plavix due to SOB  . Clindamycin/Lincomycin Anaphylaxis and Other (See Comments)    Whole body skin peeling, blisters also  . Doxycycline Anaphylaxis  . Penicillins Rash and Other (See Comments)    Has patient had a PCN reaction causing immediate rash,  facial/tongue/throat swelling, SOB or lightheadedness with hypotension: Yes Has patient had a PCN reaction causing severe rash involving mucus membranes or skin necrosis: No Has patient had a PCN reaction that required hospitalization: No Has patient had a PCN reaction occurring within the last 10 years: No If all of the above answers are "NO", then may proceed with Cephalosporin use.   Eual Fines [Brassica Oleracea]     Negatively affects colostomy  . Sulfa Antibiotics Other (See Comments)    "Allergic," per MAR  .  Tape Rash    Skin is sensitive; please use an alternative to tape   Home medications Prior to Admission medications   Medication Sig Start Date End Date Taking? Authorizing Provider  Alogliptin Benzoate 12.5 MG TABS Take 6.25 mg by mouth daily. 12/28/20  Yes [provider]  amiodarone (PACERONE) 200 MG tablet Take 200 mg by mouth daily.   Yes [provider]  atorvastatin (LIPITOR) 40 MG tablet Take 1 tablet (40 mg total) by mouth daily at 6 PM. 03/29/18  Yes Hongalgi, Lenis Dickinson, MD  ELIQUIS 5 MG TABS tablet Take 2.5 mg by mouth 2 (two) times daily. 01/20/21  Yes [provider]  FEROSUL 325 (65 Fe) MG tablet Take 325 mg by mouth daily. On Monday, Wednesday and Friday 01/08/21  Yes [provider]  furosemide (LASIX) 20 MG tablet Take 60 mg by mouth 2 (two) times daily. 01/08/21  Yes [provider]  glimepiride (AMARYL) 2 MG tablet Take 1 mg by mouth 2 (two) times daily. 01/13/21  Yes [provider]  pantoprazole (PROTONIX) 40 MG tablet Take 1 tablet (40 mg total) by mouth 2 (two) times daily. 07/28/18  Yes Ghimire, Henreitta Leber, MD  TOUJEO SOLOSTAR 300 UNIT/ML SOPN Inject 40 Units into the skin every morning. 10/25/18  Yes [provider]  Vitamin D, Cholecalciferol, 10 MCG (400 UNIT) TABS Take 2 tablets by mouth daily. 01/08/21  Yes [provider]  acetaminophen (TYLENOL) 325 MG tablet Take 2 tablets (650 mg total)  by mouth every 6 (six) hours as needed for mild pain, moderate pain or headache. 03/29/18   Vernell Leep D, MD     Vitals:   01/23/21 0636 01/23/21 0921 01/23/21 1148 01/23/21 1935  BP: 95/63 (!) 91/52 (!) 87/56 (!) 144/76  Pulse: 81 72 (!) 57 61  Resp: 19 18 18 19   Temp: (!) 97.5 F (36.4 C) 98.1 F (36.7 C) (!) 97.5 F (36.4 C) 97.9 F (36.6 C)  TempSrc: Oral Oral Oral Oral  SpO2: 97% 96% 96% 100%  Weight:      Height:       Exam Gen alert, no distress No rash, cyanosis or gangrene Sclera anicteric, throat clear  No jvd or bruits Chest clear bilat to bases, no rales/ wheezing RRR no MRG Abd soft ntnd no mass or ascites +bs obese GU normal w/ purewick cath draining clear yellowish urine MS no joint effusions or deformity Ext mild 1+ pitting hip edema, no pretib or other edema Neuro is alert, Ox 3 , nf, gen weakness and deconditioned         Date    Creat  eGFR   2009    1.33- 1.74   2011   9.5 >> 1.89  AKI episode   2015- 16  1.33- 1.77   2017- 18  1.60- 1.87 32- 40 ml/min, IIIb   Jan - June 2019 1.26- 2.38   July - Oct 2019 1.60- 2.10 27- 24m/min, IIIb   01/20/21  2.94  20    01/21/21  2.88   01/22/21  2.75   01/23/21  3.30  17     Home meds:  - amiodarone 200 qd/ lipitor 40/ eliquis 5 bid/ lasix 60 bid  - alogliptin 6.25 qd/ amaryl 153mbid/ toujeo insulin 40u qam  - protonix 40 bid   - prn's/ vitamins/ supplements      UA pend     UNa pend, UCr pen      CXR  4/06 - IMPRESSION: Cardiomegaly and mild pulmonary vascular congestion.     CXR 4/09 - IMPRESSION: Cardiomegaly with pulmonary venous congestion, but no frank pulmonary edema. Probable subsegmental atelectasis in the lung bases. Aortic atherosclerosis.      BP 90/50- 120/90  HR afib 49- 107   RR 18-20  Temp 98  RA 97%        Na 133  K 5.0  CO2 22  BUN 71  Creat 3.33  eGFR 17         Alb 2.7  LFT's okay  BNP 706         WBC 11K hb 9.6  plt 295    Assessment/ Plan: 1. Renal failure - b/l creatinine  not clear, last labs in system were from 2019 1.6- 2.10. Creat here 2.7- 3.3. Pt told he has CKD 4. Suspect this is his baseline 2.5- 3.0 range, CKD 4.  No vol excess on exam today. CXR no edema. Lasix dc'd yest, creat down today. Appears to be improving now. Poor HD candidate given debility and comorbidities, recommend conservative care overall. Have d/w patient. Cont supportive care.  2. Acute chronic combined CHF - per cardiology. Lasix off now.  3. Atrial fib - getting eliquis, BB and amio 4. CAD hx of stent to LAD (2015) 5. HTN - BP's soft, on midodrine. Not on any BP lowering meds at home.  6. DNR      Kelly Splinter  MD 01/24/2021, 9:48 AM  Recent Labs  Lab 01/20/21 1731 01/21/21 0234  WBC 13.1* 11.2*  HGB 10.3* 9.6*   Recent Labs  Lab 01/23/21 0332 01/23/21 0943  K 4.7 5.0  BUN 72* 71*  CREATININE 3.30* 3.33*  CALCIUM 8.1* 8.2*

## 2021-01-24 DIAGNOSIS — I5042 Chronic combined systolic (congestive) and diastolic (congestive) heart failure: Secondary | ICD-10-CM | POA: Diagnosis not present

## 2021-01-24 LAB — CBC WITH DIFFERENTIAL/PLATELET
Abs Immature Granulocytes: 0.06 10*3/uL (ref 0.00–0.07)
Basophils Absolute: 0.1 10*3/uL (ref 0.0–0.1)
Basophils Relative: 0 %
Eosinophils Absolute: 0.4 10*3/uL (ref 0.0–0.5)
Eosinophils Relative: 3 %
HCT: 28.2 % — ABNORMAL LOW (ref 39.0–52.0)
Hemoglobin: 8.8 g/dL — ABNORMAL LOW (ref 13.0–17.0)
Immature Granulocytes: 1 %
Lymphocytes Relative: 12 %
Lymphs Abs: 1.4 10*3/uL (ref 0.7–4.0)
MCH: 27.8 pg (ref 26.0–34.0)
MCHC: 31.2 g/dL (ref 30.0–36.0)
MCV: 89 fL (ref 80.0–100.0)
Monocytes Absolute: 1.1 10*3/uL — ABNORMAL HIGH (ref 0.1–1.0)
Monocytes Relative: 9 %
Neutro Abs: 8.4 10*3/uL — ABNORMAL HIGH (ref 1.7–7.7)
Neutrophils Relative %: 75 %
Platelets: 303 10*3/uL (ref 150–400)
RBC: 3.17 MIL/uL — ABNORMAL LOW (ref 4.22–5.81)
RDW: 14.7 % (ref 11.5–15.5)
WBC: 11.3 10*3/uL — ABNORMAL HIGH (ref 4.0–10.5)
nRBC: 0 % (ref 0.0–0.2)

## 2021-01-24 LAB — BASIC METABOLIC PANEL
Anion gap: 7 (ref 5–15)
BUN: 79 mg/dL — ABNORMAL HIGH (ref 8–23)
CO2: 23 mmol/L (ref 22–32)
Calcium: 7.9 mg/dL — ABNORMAL LOW (ref 8.9–10.3)
Chloride: 106 mmol/L (ref 98–111)
Creatinine, Ser: 3.11 mg/dL — ABNORMAL HIGH (ref 0.61–1.24)
GFR, Estimated: 19 mL/min — ABNORMAL LOW (ref >60)
Glucose, Bld: 122 mg/dL — ABNORMAL HIGH (ref 70–99)
Potassium: 4.4 mmol/L (ref 3.5–5.1)
Sodium: 136 mmol/L (ref 135–145)

## 2021-01-24 LAB — GLUCOSE, CAPILLARY
Glucose-Capillary: 127 mg/dL — ABNORMAL HIGH (ref 70–99)
Glucose-Capillary: 148 mg/dL — ABNORMAL HIGH (ref 70–99)
Glucose-Capillary: 115 mg/dL — ABNORMAL HIGH (ref 70–99)
Glucose-Capillary: 153 mg/dL — ABNORMAL HIGH (ref 70–99)

## 2021-01-24 LAB — PHOSPHORUS: Phosphorus: 4.5 mg/dL (ref 2.5–4.6)

## 2021-01-24 LAB — OCCULT BLOOD X 1 CARD TO LAB, STOOL: Fecal Occult Bld: NEGATIVE

## 2021-01-24 LAB — MAGNESIUM: Magnesium: 1.9 mg/dL (ref 1.7–2.4)

## 2021-01-24 MED ORDER — MILRINONE LACTATE IN DEXTROSE 20-5 MG/100ML-% IV SOLN
0.1250 ug/kg/min | INTRAVENOUS | Status: DC
  Administered 2021-01-24: 0.125 ug/kg/min via INTRAVENOUS
  Filled 2021-01-24 (×2): qty 100

## 2021-01-24 NOTE — Progress Notes (Addendum)
Progress Note  Patient Name: [name] [name] Date of Encounter: 01/24/2021  [facility] Cardiologist: [name], MD   Subjective   Eating breakfast Discussed worsening renal function   Inpatient Medications    Scheduled Meds: . apixaban  2.5 mg Oral BID  . atorvastatin  40 mg Oral q1800  . insulin aspart  0-5 Units Subcutaneous QHS  . insulin aspart  0-9 Units Subcutaneous TID WC  . midodrine  10 mg Oral TID WC  . pantoprazole  40 mg Oral BID  . sodium chloride flush  3 mL Intravenous Q12H   Continuous Infusions: . sodium chloride     PRN Meds: sodium chloride, acetaminophen, ondansetron (ZOFRAN) IV, sodium chloride flush   Vital Signs    Vitals:   01/23/21 0921 01/23/21 1148 01/23/21 1935 01/24/21 0342  BP: (!) 91/52 (!) 87/56 (!) 144/76 (!) 98/53  Pulse: 72 (!) 57 61 (!) 54  Resp: 18 18 19 20   Temp: 98.1 F (36.7 C) (!) 97.5 F (36.4 C) 97.9 F (36.6 C) 97.6 F (36.4 C)  TempSrc: Oral Oral Oral Oral  SpO2: 96% 96% 100% 97%  Weight:    112.6 kg  Height:        Intake/Output Summary (Last 24 hours) at 01/24/2021 0831 Last data filed at 01/24/2021 0345 Gross per 24 hour  Intake 1080 ml  Output 1550 ml  Net -470 ml   Last 3 Weights 01/24/2021 01/23/2021 01/22/2021  Weight (lbs) 248 lb 3.8 oz 248 lb 248 lb 3.8 oz  Weight (kg) 112.6 kg 112.492 kg 112.6 kg      Telemetry    Afib at rate of 100s - Personally Reviewed 01/24/2021   ECG    N/A  Physical Exam   Obese No distress Basilar crackles Abdomen soft Distant heart sounds Plus one LE edema   Labs    High Sensitivity Troponin:   Recent Labs  Lab 01/20/21 1731 01/20/21 1926 01/23/21 0332  TROPONINIHS 20* 19* 22*      Chemistry Recent Labs  Lab 01/20/21 1731 01/21/21 0234 01/23/21 0332 01/23/21 0943 01/24/21 0218  NA 135   < > 135 133* 136  K 4.4   < > 4.7 5.0 4.4  CL 106   < > 103 102 106  CO2 20*   < > 22 22 23   GLUCOSE 96   < > 111* 227* 122*  BUN 60*   < > 72* 71* 79*   CREATININE 2.94*   < > 3.30* 3.33* 3.11*  CALCIUM 8.2*   < > 8.1* 8.2* 7.9*  PROT 6.9  --   --   --   --   ALBUMIN 2.7*  --   --   --   --   AST 24  --   --   --   --   ALT 16  --   --   --   --   ALKPHOS 155*  --   --   --   --   BILITOT 0.4  --   --   --   --   GFRNONAA 20*   < > 18* 17* 19*  ANIONGAP 9   < > 10 9 7    < > = values in this interval not displayed.     Hematology Recent Labs  Lab 01/20/21 1731 01/21/21 0234 01/24/21 0218  WBC 13.1* 11.2* 11.3*  RBC 3.73* 3.44* 3.17*  HGB 10.3* 9.6* 8.8*  HCT 33.8* 31.1* 28.2*  MCV 90.6 90.4 89.0  MCH 27.6 27.9 27.8  MCHC 30.5 30.9 31.2  RDW 14.6 14.6 14.7  PLT 328 295 303    BNP Recent Labs  Lab 01/20/21 1731  BNP 705.9*     Radiology    DG Chest 1 View  Result Date: 01/23/2021 CLINICAL DATA:  87 year old male with history of shortness of breath. EXAM: CHEST  1 VIEW COMPARISON:  Chest x-ray 01/21/2019. FINDINGS: Lung volumes are normal. Study is under penetrated, limiting the diagnostic sensitivity and specificity of the examination. With these limitations in mind, there are bibasilar opacities which are favored to be technique related or potentially from underlying subsegmental atelectasis, however, airspace consolidation is difficult to entirely exclude, particularly in the left lower lobe. No pleural effusions. No pneumothorax. No suspicious appearing pulmonary nodules or masses are noted. Pulmonary vasculature is engorged, without frank pulmonary edema. Moderate cardiomegaly, similar to the prior study. Upper mediastinal contours are within normal limits. Aortic atherosclerosis. IMPRESSION: 1. Cardiomegaly with pulmonary venous congestion, but no frank pulmonary edema. 2. Probable subsegmental atelectasis in the lung bases. 3. Aortic atherosclerosis. Electronically Signed   By: Vinnie Langton M.D.   On: 01/23/2021 06:12   ECHOCARDIOGRAM COMPLETE  Result Date: 01/22/2021    ECHOCARDIOGRAM REPORT   Patient Name:    [name] Date of Exam: 01/22/2021 Medical Rec #:  [id number]     Height:       70.0 in Accession #:    [id number]    Weight:       248.2 lb Date of Birth:  [birth year]     BSA:          2.288 m Patient Age:    40 years      BP:           108/73 mmHg Patient Gender: M             HR:           87 bpm. Exam Location:  Inpatient Procedure: 2D Echo, Cardiac Doppler, Color Doppler and Intracardiac            Opacification Agent Indications:    CHF-Acute Systolic X72.62  History:        Patient has prior history of Echocardiogram examinations, most                 recent 03/14/2018. CAD, Arrythmias:Atrial Fibrillation; Risk                 Factors:Hypertension and Diabetes. Chronic kidney disease.  Sonographer:    Darlina Sicilian RDCS Referring Phys: [place]  1. Left ventricular ejection fraction, by estimation, is 20 to 25%. The left ventricle has severely decreased function. The left ventricle demonstrates global hypokinesis. There is mild left ventricular hypertrophy. Left ventricular diastolic parameters  are indeterminate.  2. Right ventricular systolic function is moderately reduced. The right ventricular size is mildly enlarged.  3. The mitral valve is normal in structure. Moderate mitral valve regurgitation.  4. The aortic valve is tricuspid. Aortic valve regurgitation is trivial. Mild to moderate aortic valve sclerosis/calcification is present, without any evidence of aortic stenosis. FINDINGS  Left Ventricle: Left ventricular ejection fraction, by estimation, is 20 to 25%. The left ventricle has severely decreased function. The left ventricle demonstrates global hypokinesis. Definity contrast agent was given IV to delineate the left ventricular endocardial borders. The left ventricular internal cavity size was normal in size. There is mild left ventricular hypertrophy. Left ventricular  diastolic parameters are indeterminate. Right Ventricle: The right ventricular size is mildly enlarged.  Right vetricular wall thickness was not well visualized. Right ventricular systolic function is moderately reduced. Left Atrium: Left atrial size was normal in size. Right Atrium: Right atrial size was normal in size. Pericardium: There is no evidence of pericardial effusion. Mitral Valve: The mitral valve is normal in structure. Moderate mitral valve regurgitation. MV peak gradient, 5.3 mmHg. The mean mitral valve gradient is 3.0 mmHg. Tricuspid Valve: The tricuspid valve is normal in structure. Tricuspid valve regurgitation is mild. Aortic Valve: The aortic valve is tricuspid. Aortic valve regurgitation is trivial. Mild to moderate aortic valve sclerosis/calcification is present, without any evidence of aortic stenosis. Aortic valve mean gradient measures 3.0 mmHg. Aortic valve peak  gradient measures 5.4 mmHg. Aortic valve area, by VTI measures 2.40 cm. Pulmonic Valve: The pulmonic valve was not well visualized. Pulmonic valve regurgitation is mild. Aorta: The aortic root and ascending aorta are structurally normal, with no evidence of dilitation. IAS/Shunts: The interatrial septum was not well visualized.  LEFT VENTRICLE PLAX 2D LVIDd:         4.70 cm LVIDs:         4.40 cm LV PW:         1.10 cm LV IVS:        1.10 cm LVOT diam:     2.30 cm LV SV:         52 LV SV Index:   23 LVOT Area:     4.15 cm  LV Volumes (MOD) LV vol d, MOD A2C: 224.0 ml LV vol d, MOD A4C: 193.0 ml LV vol s, MOD A2C: 145.0 ml LV vol s, MOD A4C: 170.0 ml LV SV MOD A2C:     79.0 ml LV SV MOD A4C:     193.0 ml LV SV MOD BP:      40.9 ml RIGHT VENTRICLE RV S prime:     9.90 cm/s TAPSE (M-mode): 1.5 cm LEFT ATRIUM             Index       RIGHT ATRIUM           Index LA diam:        4.50 cm 1.97 cm/m  RA Area:     21.20 cm LA Vol (A2C):   61.4 ml 26.83 ml/m RA Volume:   65.00 ml  28.41 ml/m LA Vol (A4C):   55.6 ml 24.30 ml/m LA Biplane Vol: 59.5 ml 26.00 ml/m  AORTIC VALVE AV Area (Vmax):    2.70 cm AV Area (Vmean):   2.57 cm AV Area  (VTI):     2.40 cm AV Vmax:           116.00 cm/s AV Vmean:          80.100 cm/s AV VTI:            0.216 m AV Peak Grad:      5.4 mmHg AV Mean Grad:      3.0 mmHg LVOT Vmax:         75.50 cm/s LVOT Vmean:        49.600 cm/s LVOT VTI:          0.125 m LVOT/AV VTI ratio: 0.58  AORTA Ao Root diam: 3.40 cm Ao Asc diam:  3.20 cm MITRAL VALVE                 TRICUSPID VALVE MV Area (PHT): 5.65 cm  TV Peak grad:   24.0 mmHg MV Area VTI:   1.98 cm MV Peak grad:  5.3 mmHg      TR Peak grad:   25.8 mmHg MV Mean grad:  3.0 mmHg      TR Vmax:        254.00 cm/s MV Vmax:       1.15 m/s MV Vmean:      87.6 cm/s     SHUNTS MV Decel Time: 134 msec      Systemic VTI:  0.12 m MR Peak grad:    60.5 mmHg   Systemic Diam: 2.30 cm MR Mean grad:    40.5 mmHg MR Vmax:         389.00 cm/s MR Vmean:        301.0 cm/s MR PISA:         1.57 cm MR PISA Eff ROA: 14 mm MR PISA Radius:  0.50 cm MV E velocity: 97.87 cm/s Oswaldo Milian MD Electronically signed by Oswaldo Milian MD Signature Date/Time: 01/22/2021/5:27:24 PM    Final     Cardiac Studies   Pending echo this admission   Patient Profile     87 y.o. male with a hx of CAD status post DES to the RI and CTO of the LAD, paroxysmal atrial fibrillation/ flutter,  chronic systolic and diastolic CHF, CKD IV, HLD  and hypertension who is being seen for the evaluation of CHF and AFib RVR. TTE wit EF 20-25%  With moderate MR  Assessment & Plan   1. Acute on chronic combined CHF -BNP 705.  CXR 01/23/21  improved no frank edema TTE wit EF 20-25% Increasing azotemia requiring holding diuretics Despite age and DNR feel short course of milrinone Empirically without checking MVO2 and using CVP line in order BP as high as 696 systolic yesterday Currently 295 systolic to my exam will have to watch on milrinone If Cr worsens on milrinone can consider low dose dobutamine   2.  Atrial fibrillation/flutter at rapid ventricular rate - on low dose eliquis and beta blocker  amiodarone d/c by Dr Marisue Ivan   3. CAD status post DES to the RI and known chronic occlusion of the LAD by cath in 2015 -no angina ASA held due to anticoagulation continue statin and beta blocker   4.  Chronic kidney disease stage III/IV -Cr up to 3.1 hold diuretics see above regarding milrinone   5.  Hypertension - BP low on midodrine   For questions or updates, please contact [place] Please consult [email or web address] for contact info under        Signed, Jenkins Rouge, MD  01/24/2021, 8:31 AM

## 2021-01-24 NOTE — Progress Notes (Signed)
PROGRESS NOTE  [name] [name] ZHG:[id number] DOB: Dec 28, 1932 DOA: 01/20/2021 PCP: [name], MD  HPI/Recap of past 24 hours: [name] [name] is an 87 y.o. male with medical history significant of coronary artery disease, paroxysmal atrial fibrillation on Eliquis, chronic combined diastolic and systolic CHF EF 40 to 19%, CKD 4, hypertension, hypothyroidism, hyperlipidemia, post right upper quadrant ileostomy, who presented to the ED from ALF due to worsening dyspnea and bilateral lower extremity edema.  Work-up revealed acute on chronic combined diastolic and systolic CHF.  Admitted by [facility].  Seen by cardiology.  Repeated 2D echo revealed worsening combined diastolic and systolic CHF with LVEF of 20 to 25% from 40 to 45% previously.  Initially diuresed however became hypotensive requiring midodrine to maintain MAP greater than 65.  IV diuresing was held by cardiology.  Oral antihypertensive have also been held.  Was started on milrinone drip on 01/24/2021.  Appreciate cardiology's assistance.  01/24/21: Patient was seen and examined at his bedside.  He reports his breathing is better this morning and denies any chest pain.  Assessment/Plan: Principal Problem:   Chronic combined systolic and diastolic CHF (congestive heart failure) ([name]) Active Problems:   CAD (coronary artery disease)   Diabetes mellitus with diabetic nephropathy without long-term current use of insulin ([name])   Acute renal failure superimposed on stage 3 chronic kidney disease ([name])   GERD (gastroesophageal reflux disease)   HTN (hypertension)   Paroxysmal atrial fibrillation ([name])  Acute on chronic combined diastolic and systolic CHF Last 2D echo done on 03/14/2018 revealed LVEF 40 to 45% with grade 1 diastolic dysfunction Repeated 2D echo done on 01/22/2021 revealed LVEF 20 to 25%, left ventricle demonstrate global hypokinesis.  Moderate mitral valve regurgitation. Beta-blocker on hold, hypotensive despite midodrine 10 mg 3  times daily. IV diuretics held by cardiology. Continue strict I's and O's and daily weight Net I&O -2.0 L Currently on milrinone drip started on 01/24/2021. Management per cardiology.  Elevated troponin, suspect demand ischemia Troponin peaked at 22 on 01/23/2021 Currently denies any anginal symptoms. 12 EKG ordered on 01/23/2021. Management per cardiology Continue to monitor on telemetry  Hypotension on midodrine Continue midodrine 10 mg 3 times daily Has received IV albumin 25 g every 6 hours x2 doses on 01/23/2021 Continue to maintain MAP greater than 65. Continue to closely monitor vital signs.  Resolved post treatment with Lokelma 5 mg x 1 dose, mild hyperkalemia 5.0, in the setting of AKI on CKD 4 Serum potassium 5.0 Has received a dose of Lokelma on 01/23/2021. Serum potassium 4.4.  Paroxysmal A. fib with RVR Currently rate controlled Beta-blocker and amiodarone on hold due to soft blood pressures to avoid hypotension. Continue Eliquis for primary CVA prevention  AKI on CKD 4 suspect cardiorenal Baseline creatinine appears to be 2.7 with GFR of 22. Creatinine 3.11 from 3.33 with GFR of 17 Continue to closely monitor urine output Avoid nephrotoxic agents and hypotension Appreciate nephrology's assistance.  Leukocytosis suspect reactive in the setting of acute illness Presented with WBC 13 point 1K, downtrended to 11 point 2K. Not on antibiotics. Afebrile, non toxic appearing Continue to monitor off antibiotics.  Hyperlipidemia Continue home Lipitor 40 mg daily.  Type 2 diabetes with hyperglycemia Hemoglobin A1c 6.6 on 01/21/2021 Continue insulin sliding scale.  GERD Continue home PPI twice daily.    Code Status: DNR  Family Communication: Updated his sister via phone with his permission.  Disposition Plan: Likely will discharge to ALF, patient came from ALF  Consultants:  Cardiology  Nephrology.  Procedures:  None  Antimicrobials:  None.  DVT  prophylaxis: Eliquis  Status is: Inpatient    Dispo: The patient is from: ALF              Anticipated d/c is to: ALF on 01/25/2021 or when cardiology and nephrology sign off.               Patient currently not stable for discharge due to hypotension, worsening renal function.   Difficult to place patient: Not applicable.        Objective: Vitals:   01/23/21 1935 01/24/21 0342 01/24/21 0856 01/24/21 1106  BP: (!) 144/76 (!) 98/53 125/76 127/69  Pulse: 61 (!) 54    Resp: 19 20 16 18   Temp: 97.9 F (36.6 C) 97.6 F (36.4 C) 97.6 F (36.4 C) 97.6 F (36.4 C)  TempSrc: Oral Oral Oral Oral  SpO2: 100% 97% 100% 97%  Weight:  112.6 kg    Height:        Intake/Output Summary (Last 24 hours) at 01/24/2021 1434 Last data filed at 01/24/2021 1258 Gross per 24 hour  Intake 960 ml  Output 1725 ml  Net -765 ml   Filed Weights   01/22/21 0600 01/23/21 0338 01/24/21 0342  Weight: 112.6 kg 112.5 kg 112.6 kg    Exam:  . General: 87 y.o. year-old male chronically ill-appearing in no acute distress.  He is alert and oriented x3.   . Cardiovascular: Irregular rate and rhythm no rubs or gallops. Marland Kitchen Respiratory: Clear to auscultation no wheezes or rales.   . Abdomen: Soft obese nontender.  Ileostomy bag in place.  . Musculoskeletal: Trace lower extremity edema bilaterally.   . Skin: No ulcerative lesions noted. Marland Kitchen Psychiatry: Mood is appropriate for condition and setting.   Data Reviewed: CBC: Recent Labs  Lab 01/20/21 1731 01/21/21 0234 01/24/21 0218  WBC 13.1* 11.2* 11.3*  NEUTROABS 9.8* 8.4* 8.4*  HGB 10.3* 9.6* 8.8*  HCT 33.8* 31.1* 28.2*  MCV 90.6 90.4 89.0  PLT 328 295 735   Basic Metabolic Panel: Recent Labs  Lab 01/21/21 0234 01/22/21 0150 01/23/21 0332 01/23/21 0943 01/24/21 0218  NA 137 136 135 133* 136  K 4.4 3.8 4.7 5.0 4.4  CL 108 105 103 102 106  CO2 20* 23 22 22 23   GLUCOSE 120* 139* 111* 227* 122*  BUN 60* 58* 72* 71* 79*  CREATININE 2.88*  2.75* 3.30* 3.33* 3.11*  CALCIUM 7.9* 8.0* 8.1* 8.2* 7.9*  MG  --   --   --   --  1.9  PHOS  --   --   --   --  4.5   GFR: Estimated Creatinine Clearance: 21.8 mL/min (A) (by C-G formula based on SCr of 3.11 mg/dL (H)). Liver Function Tests: Recent Labs  Lab 01/20/21 1731  AST 24  ALT 16  ALKPHOS 155*  BILITOT 0.4  PROT 6.9  ALBUMIN 2.7*   No results for input(s): LIPASE, AMYLASE in the last 168 hours. No results for input(s): AMMONIA in the last 168 hours. Coagulation Profile: No results for input(s): INR, PROTIME in the last 168 hours. Cardiac Enzymes: No results for input(s): CKTOTAL, CKMB, CKMBINDEX, TROPONINI in the last 168 hours. BNP (last 3 results) No results for input(s): PROBNP in the last 8760 hours. HbA1C: No results for input(s): HGBA1C in the last 72 hours. CBG: Recent Labs  Lab 01/23/21 1101 01/23/21 1540 01/23/21 2115 01/24/21 0620 01/24/21 1137  GLUCAP  148* 157* 137* 127* 148*   Lipid Profile: No results for input(s): CHOL, HDL, LDLCALC, TRIG, CHOLHDL, LDLDIRECT in the last 72 hours. Thyroid Function Tests: No results for input(s): TSH, T4TOTAL, FREET4, T3FREE, THYROIDAB in the last 72 hours. Anemia Panel: No results for input(s): VITAMINB12, FOLATE, FERRITIN, TIBC, IRON, RETICCTPCT in the last 72 hours. Urine analysis:    Component Value Date/Time   COLORURINE YELLOW 07/26/2018 1148   APPEARANCEUR HAZY (A) 07/26/2018 1148   LABSPEC 1.009 07/26/2018 1148   PHURINE 5.0 07/26/2018 1148   GLUCOSEU NEGATIVE 07/26/2018 1148   HGBUR LARGE (A) 07/26/2018 1148   BILIRUBINUR NEGATIVE 07/26/2018 [place] 07/26/2018 1148   PROTEINUR NEGATIVE 07/26/2018 1148   UROBILINOGEN 0.2 10/22/2014 1052   NITRITE NEGATIVE 07/26/2018 1148   LEUKOCYTESUR LARGE (A) 07/26/2018 1148   Sepsis Labs: @LABRCNTIP (procalcitonin:4,lacticidven:4)  ) Recent Results (from the past 240 hour(s))  SARS CORONAVIRUS 2 (TAT 6-24 HRS) Nasopharyngeal  Nasopharyngeal Swab     Status: None   Collection Time: 01/20/21  4:35 PM   Specimen: Nasopharyngeal Swab  Result Value Ref Range Status   SARS Coronavirus 2 NEGATIVE NEGATIVE Final    Comment: (NOTE) SARS-CoV-2 target nucleic acids are NOT DETECTED.  The SARS-CoV-2 RNA is generally detectable in upper and lower respiratory specimens during the acute phase of infection. Negative results do not preclude SARS-CoV-2 infection, do not rule out co-infections with other pathogens, and should not be used as the sole basis for treatment or other patient management decisions. Negative results must be combined with clinical observations, patient history, and epidemiological information. The expected result is Negative.  Fact Sheet for Patients: SugarRoll.be  Fact Sheet for Healthcare Providers: https://www.woods-mathews.com/  This test is not yet approved or cleared by the [place] FDA and  has been authorized for detection and/or diagnosis of SARS-CoV-2 by FDA under an Emergency Use Authorization (EUA). This EUA will remain  in effect (meaning this test can be used) for the duration of the COVID-19 declaration under Se ction 564(b)(1) of the Act, 21 U.S.C. section 360bbb-3(b)(1), unless the authorization is terminated or revoked sooner.  Performed at [facility], [place] [address] [place], [place] [address]   SARS CORONAVIRUS 2 (TAT 6-24 HRS) Nasopharyngeal Nasopharyngeal Swab     Status: None   Collection Time: 01/23/21  4:42 AM   Specimen: Nasopharyngeal Swab  Result Value Ref Range Status   SARS Coronavirus 2 NEGATIVE NEGATIVE Final    Comment: (NOTE) SARS-CoV-2 target nucleic acids are NOT DETECTED.  The SARS-CoV-2 RNA is generally detectable in upper and lower respiratory specimens during the acute phase of infection. Negative results do not preclude SARS-CoV-2 infection, do not rule out co-infections with other pathogens, and  should not be used as the sole basis for treatment or other patient management decisions. Negative results must be combined with clinical observations, patient history, and epidemiological information. The expected result is Negative.  Fact Sheet for Patients: SugarRoll.be  Fact Sheet for Healthcare Providers: https://www.woods-mathews.com/  This test is not yet approved or cleared by the [place] FDA and  has been authorized for detection and/or diagnosis of SARS-CoV-2 by FDA under an Emergency Use Authorization (EUA). This EUA will remain  in effect (meaning this test can be used) for the duration of the COVID-19 declaration under Se ction 564(b)(1) of the Act, 21 U.S.C. section 360bbb-3(b)(1), unless the authorization is terminated or revoked sooner.  Performed at [facility], [place],  [place] [address]       Studies: No results found.  Scheduled Meds: . apixaban  2.5 mg Oral BID  . atorvastatin  40 mg Oral q1800  . insulin aspart  0-5 Units Subcutaneous QHS  . insulin aspart  0-9 Units Subcutaneous TID WC  . midodrine  10 mg Oral TID WC  . pantoprazole  40 mg Oral BID  . sodium chloride flush  3 mL Intravenous Q12H    Continuous Infusions: . sodium chloride    . milrinone 0.125 mcg/kg/min (01/24/21 1139)     LOS: 4 days     Kayleen Memos, MD [facility] Pager [phone]  If 7PM-7AM, please contact night-coverage [email or web address] Password [facility] 01/24/2021, 2:34 PM

## 2021-01-24 NOTE — Plan of Care (Signed)
  Problem: Education: Goal: Knowledge of General Education information will improve Description: Including pain rating scale, medication(s)/side effects and non-pharmacologic comfort measures Outcome: Progressing   Problem: Health Behavior/Discharge Planning: Goal: Ability to manage health-related needs will improve Outcome: Progressing   Problem: Clinical Measurements: Goal: Will remain free from infection Outcome: Progressing Goal: Cardiovascular complication will be avoided Outcome: Progressing   Problem: Activity: Goal: Risk for activity intolerance will decrease Outcome: Progressing

## 2021-01-24 NOTE — Plan of Care (Signed)

## 2021-01-24 NOTE — Evaluation (Signed)
Occupational Therapy Evaluation Patient Details Name: [name] [name] MRN: [id number] DOB: 03/18/1933 Today's Date: 01/24/2021    History of Present Illness Patient is a 87 y/o male who presents on 01/21/21 with dyspnea and bilateral lower extremity edema.  Work-up revealed acute on chronic combined diastolic and systolic CHF complicated by A-fib with RVR. CXR-pulmonary edema. PMH includes colectomy, colostomy, paroxysmal A-fib, HTN, DM, dilated cardiomyopathy with EF 35-40%, CKD, CAD.   Clinical Impression   Pt PTA: Pt from ALF and reports near independence with ADL and mobility with scooter. Pt ambulates a little with RW and is very careful to have something behind him when standing at the sink incase he were to fall backwards. Pt set-upA for ADL and assist for OOB ADL. Pt ambulatory in room with RW with minguardA.  pt sitting EOB for LB ADL and standing at sink briefly, but unable to last too long d/t fatigue so pt sat EOB for set-upA for grooming to conserve energy.  85 BPM for max HR; O2 >90% on RA. Pt denies need for further OT skilled services and adamant about it. Energy conservation strategy education initiated and pt familiar with techniques.  OT signing off. Thank you.    Follow Up Recommendations  No OT follow up    Equipment Recommendations  None recommended by OT    Recommendations for Other Services       Precautions / Restrictions Precautions Precautions: Fall Restrictions Weight Bearing Restrictions: No      Mobility Bed Mobility Overal bed mobility: Modified Independent Bed Mobility: Supine to Sit;Sit to Supine     Supine to sit: Modified independent (Device/Increase time) Sit to supine: Modified independent (Device/Increase time)   General bed mobility comments: no physical assist; use of rails    Transfers Overall transfer level: Needs assistance Equipment used: Rolling walker (2 wheeled) Transfers: Sit to/from Stand Sit to Stand: Modified independent  (Device/Increase time)         General transfer comment: no physical assist    Balance Overall balance assessment: Needs assistance Sitting-balance support: Feet supported;No upper extremity supported Sitting balance-[name]: Good     Standing balance support: During functional activity Standing balance-[name]: Poor Standing balance comment: Pt requires RW and does not stand long 2/2 dyspnea and poor endurance.                           ADL either performed or assessed with clinical judgement   ADL Overall ADL's : At baseline;Modified independent                                       General ADL Comments: pt sitting EOB for LB ADL and standing at sink briefly, but unable to last too long d/t fatigue so pt sat EOB for set-upA for grooming to conserve energy.     Vision Baseline Vision/History: No visual deficits Patient Visual Report: No change from baseline Vision Assessment?: No apparent visual deficits     Perception     Praxis      Pertinent Vitals/Pain Pain Assessment: No/denies pain     Hand Dominance Right   Extremity/Trunk Assessment Upper Extremity Assessment Upper Extremity Assessment: Generalized weakness;RUE deficits/detail;LUE deficits/detail RUE Deficits / Details: previous rotator cuff injuries limiting shoulder elevation and flex <60* LUE Deficits / Details: previous rotator cuff injuries limiting shoulder elevation and flex <60*  Lower Extremity Assessment Lower Extremity Assessment: Defer to PT evaluation;Generalized weakness   Cervical / Trunk Assessment Cervical / Trunk Assessment: Other exceptions Cervical / Trunk Exceptions: large body habitus   Communication Communication Communication: No difficulties   Cognition Arousal/Alertness: Awake/alert Behavior During Therapy: WFL for tasks assessed/performed Overall Cognitive Status: Within Functional Limits for tasks assessed                                  General Comments: Pt reporting frustration  "they do the same thing everytime I come here. I can walk and I stop when I get tired, but I know you have a job to do."   General Comments  85 BPM for max HR; O2 >90% on RA.    Exercises     Shoulder Instructions      Home Living Family/patient expects to be discharged to:: Assisted living                             Home Equipment: Gilford Rile - 2 wheels;Walker - 4 wheels;Tub bench;Bedside commode;Wheelchair - power          Prior Functioning/Environment Level of Independence: Needs assistance  Gait / Transfers Assistance Needed: Uses RW for walking within room; uses electric w/c for walking to dining hall. Reports no falls. ADL's / Homemaking Assistance Needed: Dresses self, gets assist for getting into/out of shower.            OT Problem List: Decreased activity tolerance      OT Treatment/Interventions:      OT Goals(Current goals can be found in the care plan section) Acute Rehab OT Goals Patient Stated Goal: to go home OT Goal Formulation: All assessment and education complete, DC therapy Potential to Achieve Goals: Good  OT Frequency:     Barriers to D/C:            Co-evaluation              AM-PAC OT "6 Clicks" Daily Activity     Outcome Measure Help from another person eating meals?: None Help from another person taking care of personal grooming?: A Little Help from another person toileting, which includes using toliet, bedpan, or urinal?: A Little Help from another person bathing (including washing, rinsing, drying)?: A Little Help from another person to put on and taking off regular upper body clothing?: A Little Help from another person to put on and taking off regular lower body clothing?: A Little 6 Click Score: 19   End of Session Equipment Utilized During Treatment: Rolling walker Nurse Communication: Mobility status  Activity Tolerance: Patient tolerated treatment  well Patient left: in bed;with call bell/phone within reach;with bed alarm set  OT Visit Diagnosis: Unsteadiness on feet (R26.81);Muscle weakness (generalized) (M62.81)                Time: 8830-1415 OT Time Calculation (min): 19 min Charges:  OT General Charges $OT Visit: 1 Visit OT Evaluation $OT Eval Moderate Complexity: 1 Mod  Jefferey Pica, OTR/L Acute Rehabilitation Services Pager: [phone] Office: [phone]   Kineta Fudala C 01/24/2021, 3:05 PM

## 2021-01-25 DIAGNOSIS — I5042 Chronic combined systolic (congestive) and diastolic (congestive) heart failure: Secondary | ICD-10-CM | POA: Diagnosis not present

## 2021-01-25 DIAGNOSIS — I5043 Acute on chronic combined systolic (congestive) and diastolic (congestive) heart failure: Secondary | ICD-10-CM

## 2021-01-25 DIAGNOSIS — N179 Acute kidney failure, unspecified: Secondary | ICD-10-CM | POA: Diagnosis not present

## 2021-01-25 DIAGNOSIS — I4891 Unspecified atrial fibrillation: Secondary | ICD-10-CM | POA: Diagnosis not present

## 2021-01-25 LAB — LACTIC ACID, PLASMA: Lactic Acid, Venous: 2.2 mmol/L (ref 0.5–1.9)

## 2021-01-25 LAB — GLUCOSE, CAPILLARY
Glucose-Capillary: 154 mg/dL — ABNORMAL HIGH (ref 70–99)
Glucose-Capillary: 172 mg/dL — ABNORMAL HIGH (ref 70–99)
Glucose-Capillary: 167 mg/dL — ABNORMAL HIGH (ref 70–99)
Glucose-Capillary: 209 mg/dL — ABNORMAL HIGH (ref 70–99)

## 2021-01-25 LAB — BASIC METABOLIC PANEL
Anion gap: 8 (ref 5–15)
BUN: 75 mg/dL — ABNORMAL HIGH (ref 8–23)
CO2: 20 mmol/L — ABNORMAL LOW (ref 22–32)
Calcium: 8.1 mg/dL — ABNORMAL LOW (ref 8.9–10.3)
Chloride: 108 mmol/L (ref 98–111)
Creatinine, Ser: 2.92 mg/dL — ABNORMAL HIGH (ref 0.61–1.24)
GFR, Estimated: 20 mL/min — ABNORMAL LOW (ref >60)
Glucose, Bld: 153 mg/dL — ABNORMAL HIGH (ref 70–99)
Potassium: 4.4 mmol/L (ref 3.5–5.1)
Sodium: 136 mmol/L (ref 135–145)

## 2021-01-25 MED ORDER — AMIODARONE HCL IN DEXTROSE 360-4.14 MG/200ML-% IV SOLN
30.0000 mg/h | INTRAVENOUS | Status: DC
  Administered 2021-01-25 – 2021-01-26 (×3): 30 mg/h via INTRAVENOUS
  Filled 2021-01-25 (×3): qty 200

## 2021-01-25 MED ORDER — AMIODARONE LOAD VIA INFUSION
150.0000 mg | Freq: Once | INTRAVENOUS | Status: AC
  Administered 2021-01-25: 150 mg via INTRAVENOUS
  Filled 2021-01-25: qty 83.34

## 2021-01-25 MED ORDER — AMIODARONE HCL IN DEXTROSE 360-4.14 MG/200ML-% IV SOLN
60.0000 mg/h | INTRAVENOUS | Status: DC
  Administered 2021-01-25 (×2): 60 mg/h via INTRAVENOUS
  Filled 2021-01-25 (×2): qty 200

## 2021-01-25 NOTE — Progress Notes (Addendum)
Progress Note  Patient Name: [name] [name] Date of Encounter: 01/25/2021  [facility] Cardiologist: [name], MD   Subjective   Denies any chest pain, reports dyspnea  Inpatient Medications    Scheduled Meds: . apixaban  2.5 mg Oral BID  . atorvastatin  40 mg Oral q1800  . insulin aspart  0-5 Units Subcutaneous QHS  . insulin aspart  0-9 Units Subcutaneous TID WC  . midodrine  10 mg Oral TID WC  . pantoprazole  40 mg Oral BID  . sodium chloride flush  3 mL Intravenous Q12H   Continuous Infusions: . sodium chloride    . milrinone 0.125 mcg/kg/min (01/24/21 1139)   PRN Meds: sodium chloride, acetaminophen, ondansetron (ZOFRAN) IV, sodium chloride flush   Vital Signs    Vitals:   01/24/21 2013 01/25/21 0135 01/25/21 0425 01/25/21 0427  BP: (!) 97/59 101/76 125/72   Pulse: 65 88 91   Resp: 16 16 20    Temp: 98 F (36.7 C) 98.4 F (36.9 C) 98.3 F (36.8 C)   TempSrc: Oral Oral Oral   SpO2: 100% 95% 95%   Weight:    112.8 kg  Height:        Intake/Output Summary (Last 24 hours) at 01/25/2021 0921 Last data filed at 01/25/2021 0800 Gross per 24 hour  Intake 973.78 ml  Output 1076 ml  Net -102.22 ml   Last 3 Weights 01/25/2021 01/24/2021 01/23/2021  Weight (lbs) 248 lb 9.6 oz 248 lb 3.8 oz 248 lb  Weight (kg) 112.764 kg 112.6 kg 112.492 kg      Telemetry    NSR 60s last night now in AF with 100-120s - Personally Reviewed  ECG    No new ECG- Personally Reviewed  Physical Exam   GEN: No acute distress.   Neck: + JVD Cardiac: irregular, tachycardic, no murmurs, rubs, or gallops.  Respiratory: Bibasilar crackles GI: Soft, nontender, non-distended  MS: Trace edema; No deformity. Neuro:  Nonfocal  Psych: Normal affect   Labs    High Sensitivity Troponin:   Recent Labs  Lab 01/20/21 1731 01/20/21 1926 01/23/21 0332  TROPONINIHS 20* 19* 22*      Chemistry Recent Labs  Lab 01/20/21 1731 01/21/21 0234 01/23/21 0943 01/24/21 0218  01/25/21 0402  NA 135   < > 133* 136 136  K 4.4   < > 5.0 4.4 4.4  CL 106   < > 102 106 108  CO2 20*   < > 22 23 20*  GLUCOSE 96   < > 227* 122* 153*  BUN 60*   < > 71* 79* 75*  CREATININE 2.94*   < > 3.33* 3.11* 2.92*  CALCIUM 8.2*   < > 8.2* 7.9* 8.1*  PROT 6.9  --   --   --   --   ALBUMIN 2.7*  --   --   --   --   AST 24  --   --   --   --   ALT 16  --   --   --   --   ALKPHOS 155*  --   --   --   --   BILITOT 0.4  --   --   --   --   GFRNONAA 20*   < > 17* 19* 20*  ANIONGAP 9   < > 9 7 8    < > = values in this interval not displayed.     Hematology Recent Labs  Lab  01/20/21 1731 01/21/21 0234 01/24/21 0218  WBC 13.1* 11.2* 11.3*  RBC 3.73* 3.44* 3.17*  HGB 10.3* 9.6* 8.8*  HCT 33.8* 31.1* 28.2*  MCV 90.6 90.4 89.0  MCH 27.6 27.9 27.8  MCHC 30.5 30.9 31.2  RDW 14.6 14.6 14.7  PLT 328 295 303    BNP Recent Labs  Lab 01/20/21 1731  BNP 705.9*     DDimer No results for input(s): DDIMER in the last 168 hours.   Radiology    No results found.  Cardiac Studies   Echo 01/22/21: 1. Left ventricular ejection fraction, by estimation, is 20 to 25%. The  left ventricle has severely decreased function. The left ventricle  demonstrates global hypokinesis. There is mild left ventricular  hypertrophy. Left ventricular diastolic parameters  are indeterminate.  2. Right ventricular systolic function is moderately reduced. The right  ventricular size is mildly enlarged.  3. The mitral valve is normal in structure. Moderate mitral valve  regurgitation.  4. The aortic valve is tricuspid. Aortic valve regurgitation is trivial.  Mild to moderate aortic valve sclerosis/calcification is present, without  any evidence of aortic stenosis.   Patient Profile     87 y.o. male with a hx of CAD status post DES to the RI and CTO of the LAD,paroxysmal atrial fibrillation/flutter, chronic systolic anddiastolic CHF, CKD IV, HLD and hypertensionwho is being seen for the  evaluation ofCHF and AFib RVR. TTE wit EF 20-25%  With moderate MR   Assessment & Plan    Acute on chronic systolic heart failure, EF 20-25%: Admitted with volume overload and AKI.  BNP elevated.  Chest x-ray with pulmonary edema.  Echo with EF 20-25% (previously 40-45%. -Initially diuresed with IV Lasix, had worsening kidney function.  Lasix held.  Creatinine appears to be improving. -Started on empiric milrinone yesterday.   Felt worse with milrinone and refused.  I think reasonable to hold off on milrinone as creatinine improving with diuresis.  He is warm and mentating appropriately.  Will check lactate. -Given his CKD stage IV and poor functional status, he is not a candidate for invasive work-up of his cardiomyopathy. -Can fold in GDMT as BP tolerates.   Atrial fibrillation with RVR : Back in A. fib.  Suspect this is being driven by his congestive heart failure.  -Amiodarone was stopped on admission.  He is going in and out of AF, will restart amio to try and maintain sinus rhythm -Currently not on AV nodal blockers. -No plans for procedures.  Continue Eliquis 2.5 mg twice daily.  This is an appropriate dose for his age and kidney function.    CAD : Cath in 2015 with CTO of the LAD.  Status post drug-eluting stent to the ramus.  -Troponins are flat negative.  No symptoms of angina.    Acute on chronic CKD: Now CKD stage IV.  Avoid nephrotoxic agents.  -Closely monitor his kidney function while he is in-house.   For questions or updates, please contact [place] Please consult [email or web address] for contact info under        Signed, Donato Heinz, MD  01/25/2021, 9:21 AM

## 2021-01-25 NOTE — Progress Notes (Signed)
[facility] Progress Note    Assessment/ Plan:   87 year old male with CAD, CKD4 , CHF EF 40-45%, paroxysmal atrial fibrillation on Eliquis and amiodarone at home, HTN, UC, HTN, HLD, post right upper quadrant ileostomy who presented to the ED from [facility] due to worsening of SOB, orthopnea and LE edema and admitted for CHF exacerbation. He required agressive diuresis. His kidney function got worse>lasix stopped and nephrology consulted.   1. Worsening of kidney function: AKI on CKD, likely secondary to diuresis.   Cr today 2.92 from 3.11 yesterday<3.33<  I/O: net neg 342 / 24 with good UOP (1065m) He has some mild bibasilar crackles and elevated JVP but SOB improved and he is in RA. Will keep holding lasix today and  recheck renal function tomorrow. Will likely need to resume his lasix in the next 24-48hrs.  Would be nice to have a outpt clinic baseline (1[facility]9 [facility] as he's likely to have had progression.  [facility] he will need to f/u with his nephrologist upon dc  Filed Weights   01/23/21 0338 01/24/21 0342 01/25/21 0427  Weight: 112.5 kg 112.6 kg 112.8 kg     BMP Latest Ref Rng & Units 01/25/2021 01/24/2021 01/23/2021  Glucose 70 - 99 mg/dL 153(H) 122(H) 227(H)  BUN 8 - 23 mg/dL 75(H) 79(H) 71(H)  Creatinine 0.61 - 1.24 mg/dL 2.92(H) 3.11(H) 3.33(H)  BUN/Creat Ratio 10 - 24 - - -  Sodium 135 - 145 mmol/L 136 136 133(L)  Potassium 3.5 - 5.1 mmol/L 4.4 4.4 5.0  Chloride 98 - 111 mmol/L 108 106 102  CO2 22 - 32 mmol/L 20(L) 23 22  Calcium 8.9 - 10.3 mg/dL 8.1(L) 7.9(L) 8.2(L)    2. CHF exacerbation: HF with moderately reduced EF: On Po Lasix 60 mg BBID at home. Received IV lasix here for CHF exacerbation that held due to worsening of kidney function as above.  SOB is better. He is on RA. He has mild to moderate hyper volumia on exam (JVP is up till mid-upper neck and has mild bibasilar crackle but no LEE.) Dr.  SGardiner Rhyme cardiologist saw the patient as well while I was in the room. The plan will be holding diuretic today to help with kidney function. Milrinone stopped per Dr. SGardiner Rhymeas patient seems to have good perfusion.  -Holding lasix today -Per cardiology: -LA ordered -Not on ACE I, ARB given worsening of kidney function -Not on BB  3. [facility]: [facility]. [facility]. Asymptomatic. BP stable. On Amiodarone and Eliquis at home. -Per cardiology. Resumed Amiodarone (that has stopped last week) -On Eliquis 2.5 mg BID -Cardiac monitoring  4. HLD: on statin 5. Hx of HTN: Has had soft BP here. On Midodrine 10 mg PO TID Subjective:   Patient was seen and evaluated at bedside this morning rounds. He went to Afib last night. He denies any worsening of palpitation. No CP. He states that he was able to walk in hallway but had a little bit of SOB although his SOB is better comparing to when he came to the hospital. No complaint.    Objective:   BP 125/72 (BP Location: Left Arm)   Pulse 91   Temp 98.3 F (36.8 C) (Oral)   Resp 20   Ht 5' 10"  (1.778 m)   Wt 112.8 kg   SpO2 95%   BMI 35.67 kg/m   Intake/Output Summary (  Last 24 hours) at 01/25/2021 0943 Last data filed at 01/25/2021 0800 Gross per 24 hour  Intake 973.78 ml  Output 1076 ml  Net -102.22 ml   Weight change: 0.164 kg  Physical Exam: Gen: In no acute distress RVU:YEBXIDHWY rhythm. No murmur. JVP elevated up to mid-upper neck Resp: Mild bibasilar crackles, in RA. Nl work of breathing SHU:OHFG, non tender to palpation Ext: Warm, no LEE  Imaging: No results found.  Labs: BMET Recent Labs  Lab 01/20/21 1731 01/21/21 0234 01/22/21 0150 01/23/21 0332 01/23/21 0943 01/24/21 0218 01/25/21 0402  NA 135 137 136 135 133* 136 136  K 4.4 4.4 3.8 4.7 5.0 4.4 4.4  CL 106 108 105 103 102 106 108  CO2 20* 20* 23 22 22 23  20*  GLUCOSE 96 120* 139* 111* 227* 122* 153*  BUN 60* 60* 58* 72* 71* 79*  75*  CREATININE 2.94* 2.88* 2.75* 3.30* 3.33* 3.11* 2.92*  CALCIUM 8.2* 7.9* 8.0* 8.1* 8.2* 7.9* 8.1*  PHOS  --   --   --   --   --  4.5  --    CBC Recent Labs  Lab 01/20/21 1731 01/21/21 0234 01/24/21 0218  WBC 13.1* 11.2* 11.3*  NEUTROABS 9.8* 8.4* 8.4*  HGB 10.3* 9.6* 8.8*  HCT 33.8* 31.1* 28.2*  MCV 90.6 90.4 89.0  PLT 328 295 303    Medications:    . apixaban  2.5 mg Oral BID  . atorvastatin  40 mg Oral q1800  . insulin aspart  0-5 Units Subcutaneous QHS  . insulin aspart  0-9 Units Subcutaneous TID WC  . midodrine  10 mg Oral TID WC  . pantoprazole  40 mg Oral BID  . sodium chloride flush  3 mL Intravenous Q12H    Signed: Dewayne Hatch, MD 01/25/2021, 9:43 AM

## 2021-01-25 NOTE — Progress Notes (Signed)
PROGRESS NOTE  [name] [name] TUU:[id number] DOB: [birth year] DOA: 01/20/2021 PCP: [name], MD  HPI/Recap of past 24 hours: [name] [name] is an 87 y.o. male with medical history significant of coronary artery disease, paroxysmal atrial fibrillation on Eliquis, chronic combined diastolic and systolic CHF EF 40 to 79%, CKD 4, hypertension, hypothyroidism, hyperlipidemia, post right upper quadrant ileostomy, who presented to the ED from ALF due to worsening dyspnea and bilateral lower extremity edema.  Work-up revealed acute on chronic combined diastolic and systolic CHF.  Admitted by [facility].  Seen by cardiology.  Repeated 2D echo revealed worsening combined diastolic and systolic CHF with LVEF of 20 to 25% from 40 to 45% previously.  Initially diuresed however became hypotensive requiring midodrine to maintain MAP greater than 65.  IV diuresing was held by cardiology.  Oral antihypertensive have also been held.  Was started on milrinone drip on 01/24/2021.  Appreciate cardiology's assistance.  01/25/21: Patient was seen and examined at his bedside this morning.  He states milrinone made him feel worse.  Amiodarone drip started by cardiology.   Assessment/Plan: Principal Problem:   Chronic combined systolic and diastolic CHF (congestive heart failure) ([name]) Active Problems:   CAD (coronary artery disease)   Diabetes mellitus with diabetic nephropathy without long-term current use of insulin ([name])   Acute renal failure superimposed on stage 3 chronic kidney disease ([name])   GERD (gastroesophageal reflux disease)   HTN (hypertension)   Paroxysmal atrial fibrillation ([name])  Acute on chronic combined diastolic and systolic CHF Last 2D echo done on 03/14/2018 revealed LVEF 40 to 45% with grade 1 diastolic dysfunction Repeated 2D echo done on 01/22/2021 revealed LVEF 20 to 25%, left ventricle demonstrate global hypokinesis.  Moderate mitral valve regurgitation. Beta-blocker on hold, hypotensive despite  midodrine 10 mg 3 times daily. IV diuretics held by cardiology. Continue strict I's and O's and daily weight Net I&O -2.5 L Was started on palliative milrinone on 01/24/2021, patient stated made him feel worse, declined further doses. Amiodarone drip started on 01/25/2021 by cardiology for A. fib. Management per cardiology.  Paroxysmal A. fib with RVR Started on amiodarone drip by cardiology. Continue to monitor vital signs Continue to monitor on telemetry. Continue Eliquis for primary CVA prevention  AKI on CKD 4 suspect cardiorenal Baseline creatinine appears to be 2.7 with GFR of 22. Creatinine downtrending 2.92 off diuretics. Creatinine previously 3.11 from 3.33 Continue to closely monitor urine output Continue to avoid nephrotoxic agents and hypotension Appreciate nephrology's assistance.  Elevated troponin, suspect demand ischemia Troponin peaked at 22 on 01/23/2021 Currently denies any anginal symptoms. 12 EKG ordered on 01/23/2021. Management per cardiology Continue to monitor on telemetry  Hypotension on midodrine Continue midodrine 10 mg 3 times daily Has received IV albumin 25 g every 6 hours x2 doses on 01/23/2021 Continue to maintain MAP greater than 65. Continue to closely monitor vital signs.  Resolved post treatment with Lokelma 5 mg x 1 dose, mild hyperkalemia 5.0, in the setting of AKI on CKD 4 Serum potassium 4.4. Has received a dose of Lokelma on 01/23/2021.  Leukocytosis suspect reactive in the setting of acute illness Presented with WBC 13 point 1K, downtrended to 11 point 2K. Not on antibiotics. Afebrile, non toxic appearing Continue to monitor off antibiotics.  Hyperlipidemia Continue home Lipitor 40 mg daily.  Type 2 diabetes with hyperglycemia Hemoglobin A1c 6.6 on 01/21/2021 Continue insulin sliding scale.  GERD Continue home PPI twice daily.    Code Status: DNR  Family  Communication: Updated his sister via phone with his  permission.  Disposition Plan: Likely will discharge to ALF, patient came from ALF   Consultants:  Cardiology  Nephrology.  Palliative care team.  Procedures:  None  Antimicrobials:  None.  DVT prophylaxis: Eliquis  Status is: Inpatient    Dispo: The patient is from: ALF              Anticipated d/c is to: ALF on 01/26/2021 or when cardiology and nephrology sign off.               Patient currently not stable for discharge due to hypotension, worsening renal function.   Difficult to place patient: Not applicable.        Objective: Vitals:   01/25/21 0135 01/25/21 0425 01/25/21 0427 01/25/21 1146  BP: 101/76 125/72  (!) 127/112  Pulse: 88 91  84  Resp: 16 20  20   Temp: 98.4 F (36.9 C) 98.3 F (36.8 C)  98.4 F (36.9 C)  TempSrc: Oral Oral  Oral  SpO2: 95% 95%  97%  Weight:   112.8 kg   Height:        Intake/Output Summary (Last 24 hours) at 01/25/2021 1742 Last data filed at 01/25/2021 1704 Gross per 24 hour  Intake 720 ml  Output 1200 ml  Net -480 ml   Filed Weights   01/23/21 0338 01/24/21 0342 01/25/21 0427  Weight: 112.5 kg 112.6 kg 112.8 kg    Exam:  . General: 87 y.o. year-old male chronically ill-appearing no acute distress.  He is alert oriented x3.   . Cardiovascular: Irregular rate and rhythm no rubs or gallops.  Marland Kitchen Respiratory: Clear to auscultation no wheezes or rales.   . Abdomen: Soft obese nontender normal bowel sounds present.  Ileostomy bag in place.  . Musculoskeletal: Trace lower extremity edema bilaterally. . Skin: No ulcerative lesions noted.   Marland Kitchen Psychiatry: Mood is irritable.   Data Reviewed: CBC: Recent Labs  Lab 01/20/21 1731 01/21/21 0234 01/24/21 0218  WBC 13.1* 11.2* 11.3*  NEUTROABS 9.8* 8.4* 8.4*  HGB 10.3* 9.6* 8.8*  HCT 33.8* 31.1* 28.2*  MCV 90.6 90.4 89.0  PLT 328 295 754   Basic Metabolic Panel: Recent Labs  Lab 01/22/21 0150 01/23/21 0332 01/23/21 0943 01/24/21 0218 01/25/21 0402  NA 136  135 133* 136 136  K 3.8 4.7 5.0 4.4 4.4  CL 105 103 102 106 108  CO2 23 22 22 23  20*  GLUCOSE 139* 111* 227* 122* 153*  BUN 58* 72* 71* 79* 75*  CREATININE 2.75* 3.30* 3.33* 3.11* 2.92*  CALCIUM 8.0* 8.1* 8.2* 7.9* 8.1*  MG  --   --   --  1.9  --   PHOS  --   --   --  4.5  --    GFR: Estimated Creatinine Clearance: 23.3 mL/min (A) (by C-G formula based on SCr of 2.92 mg/dL (H)). Liver Function Tests: Recent Labs  Lab 01/20/21 1731  AST 24  ALT 16  ALKPHOS 155*  BILITOT 0.4  PROT 6.9  ALBUMIN 2.7*   No results for input(s): LIPASE, AMYLASE in the last 168 hours. No results for input(s): AMMONIA in the last 168 hours. Coagulation Profile: No results for input(s): INR, PROTIME in the last 168 hours. Cardiac Enzymes: No results for input(s): CKTOTAL, CKMB, CKMBINDEX, TROPONINI in the last 168 hours. BNP (last 3 results) No results for input(s): PROBNP in the last 8760 hours. HbA1C: No results for input(s): HGBA1C in  the last 72 hours. CBG: Recent Labs  Lab 01/24/21 1601 01/24/21 2105 01/25/21 0648 01/25/21 1140 01/25/21 1603  GLUCAP 115* 153* 154* 172* 167*   Lipid Profile: No results for input(s): CHOL, HDL, LDLCALC, TRIG, CHOLHDL, LDLDIRECT in the last 72 hours. Thyroid Function Tests: No results for input(s): TSH, T4TOTAL, FREET4, T3FREE, THYROIDAB in the last 72 hours. Anemia Panel: No results for input(s): VITAMINB12, FOLATE, FERRITIN, TIBC, IRON, RETICCTPCT in the last 72 hours. Urine analysis:    Component Value Date/Time   COLORURINE YELLOW 07/26/2018 1148   APPEARANCEUR HAZY (A) 07/26/2018 1148   LABSPEC 1.009 07/26/2018 1148   PHURINE 5.0 07/26/2018 1148   GLUCOSEU NEGATIVE 07/26/2018 1148   HGBUR LARGE (A) 07/26/2018 1148   BILIRUBINUR NEGATIVE 07/26/2018 [place] 07/26/2018 1148   PROTEINUR NEGATIVE 07/26/2018 1148   UROBILINOGEN 0.2 10/22/2014 1052   NITRITE NEGATIVE 07/26/2018 1148   LEUKOCYTESUR LARGE (A) 07/26/2018 1148    Sepsis Labs: @LABRCNTIP (procalcitonin:4,lacticidven:4)  ) Recent Results (from the past 240 hour(s))  SARS CORONAVIRUS 2 (TAT 6-24 HRS) Nasopharyngeal Nasopharyngeal Swab     Status: None   Collection Time: 01/20/21  4:35 PM   Specimen: Nasopharyngeal Swab  Result Value Ref Range Status   SARS Coronavirus 2 NEGATIVE NEGATIVE Final    Comment: (NOTE) SARS-CoV-2 target nucleic acids are NOT DETECTED.  The SARS-CoV-2 RNA is generally detectable in upper and lower respiratory specimens during the acute phase of infection. Negative results do not preclude SARS-CoV-2 infection, do not rule out co-infections with other pathogens, and should not be used as the sole basis for treatment or other patient management decisions. Negative results must be combined with clinical observations, patient history, and epidemiological information. The expected result is Negative.  Fact Sheet for Patients: SugarRoll.be  Fact Sheet for Healthcare Providers: https://www.woods-mathews.com/  This test is not yet approved or cleared by the [place] FDA and  has been authorized for detection and/or diagnosis of SARS-CoV-2 by FDA under an Emergency Use Authorization (EUA). This EUA will remain  in effect (meaning this test can be used) for the duration of the COVID-19 declaration under Se ction 564(b)(1) of the Act, 21 U.S.C. section 360bbb-3(b)(1), unless the authorization is terminated or revoked sooner.  Performed at [facility], [place] [address] [place], [place] [address]   SARS CORONAVIRUS 2 (TAT 6-24 HRS) Nasopharyngeal Nasopharyngeal Swab     Status: None   Collection Time: 01/23/21  4:42 AM   Specimen: Nasopharyngeal Swab  Result Value Ref Range Status   SARS Coronavirus 2 NEGATIVE NEGATIVE Final    Comment: (NOTE) SARS-CoV-2 target nucleic acids are NOT DETECTED.  The SARS-CoV-2 RNA is generally detectable in upper and lower respiratory  specimens during the acute phase of infection. Negative results do not preclude SARS-CoV-2 infection, do not rule out co-infections with other pathogens, and should not be used as the sole basis for treatment or other patient management decisions. Negative results must be combined with clinical observations, patient history, and epidemiological information. The expected result is Negative.  Fact Sheet for Patients: SugarRoll.be  Fact Sheet for Healthcare Providers: https://www.woods-mathews.com/  This test is not yet approved or cleared by the [place] FDA and  has been authorized for detection and/or diagnosis of SARS-CoV-2 by FDA under an Emergency Use Authorization (EUA). This EUA will remain  in effect (meaning this test can be used) for the duration of the COVID-19 declaration under Se ction 564(b)(1) of the Act, 21 U.S.C. section  360bbb-3(b)(1), unless the authorization is terminated or revoked sooner.  Performed at [facility], [place] [address], [place], [place] [address]       Studies: No results found.  Scheduled Meds: . apixaban  2.5 mg Oral BID  . atorvastatin  40 mg Oral q1800  . insulin aspart  0-5 Units Subcutaneous QHS  . insulin aspart  0-9 Units Subcutaneous TID WC  . midodrine  10 mg Oral TID WC  . pantoprazole  40 mg Oral BID  . sodium chloride flush  3 mL Intravenous Q12H    Continuous Infusions: . sodium chloride    . amiodarone       LOS: 5 days     Kayleen Memos, MD [facility] Pager [phone]  If 7PM-7AM, please contact night-coverage [email or web address] Password [facility] 01/25/2021, 5:42 PM

## 2021-01-25 NOTE — Care Management Important Message (Signed)
Important Message  Patient Details  Name: [name] [name] MRN: [id number] Date of Birth: 02/12/1933   Medicare Important Message Given:  Yes     Shelda Altes 01/25/2021, 9:03 AM

## 2021-01-25 NOTE — Progress Notes (Signed)
Patient refuse milrinone this am, patient stated the medication make him feel worse instead of getting better, HR is in 100-125 Afib denies pain but c/o SOB Card PA notified no new order given at this time will continue to monitor the patient.

## 2021-01-25 NOTE — Progress Notes (Signed)
Physical Therapy Treatment Patient Details Name: [name] [name] MRN: [id number] DOB: [birth year] Today's Date: 01/25/2021    History of Present Illness Patient is a 87 y/o male who presents on 01/21/21 with dyspnea and bilateral lower extremity edema.  Work-up revealed acute on chronic combined diastolic and systolic CHF complicated by A-fib with RVR. CXR-pulmonary edema. PMH includes colectomy, colostomy, paroxysmal A-fib, HTN, DM, dilated cardiomyopathy with EF 35-40%, CKD, CAD.    PT Comments    Patient progressing slowly towards PT goals. Reports SOB is worsened this AM after getting a certain medication yesterday.Verbally frustrated. Improved ambulation distance slightly with Min guard assist and use of RW for support. 2-3/4 DOE noted with activity. HR 90s-156 bpm max A-fib, 02>95% on RA. Swelling seems improved in LEs. Asking if he can go home if there is nothing they can do for me.Encouraged increasing activity and walking to increase LE strength/endurance. Will follow.   Follow Up Recommendations  No PT follow up;Supervision - Intermittent     Equipment Recommendations  None recommended by PT    Recommendations for Other Services       Precautions / Restrictions Precautions Precautions: Fall;Other (comment) Precaution Comments: watch HR Restrictions Weight Bearing Restrictions: No    Mobility  Bed Mobility               General bed mobility comments: Sitting EOB upon PT arrival.    Transfers Overall transfer level: Needs assistance Equipment used: Rolling walker (2 wheeled) Transfers: Sit to/from Stand Sit to Stand: Modified independent (Device/Increase time)         General transfer comment: Stood from EOB without difficulty, transferred to chair post ambulation.  Ambulation/Gait Ambulation/Gait assistance: Min guard Gait Distance (Feet): 100 Feet Assistive device: Rolling walker (2 wheeled) Gait Pattern/deviations: Step-through pattern;Decreased stride  length;Trunk flexed   Gait velocity interpretation: 1.31 - 2.62 ft/sec, indicative of limited community ambulator General Gait Details: Slow, mildly unsteady gait with RW for support; 2-3/4 DOE. HR up to 156 bpm A-fib with 50 +PVCs, Sp02 95% on RA.   Stairs             Wheelchair Mobility    Modified Rankin (Stroke Patients Only)       Balance Overall balance assessment: Needs assistance Sitting-balance support: Feet supported;No upper extremity supported Sitting balance-[name]: Good     Standing balance support: During functional activity Standing balance-[name]: Poor Standing balance comment: Pt requires RW and does not stand long 2/2 dyspnea and poor endurance.                            Cognition Arousal/Alertness: Awake/alert Behavior During Therapy: WFL for tasks assessed/performed Overall Cognitive Status: Within Functional Limits for tasks assessed                                 General Comments: Frustrated about having more trouble breathing this morning reporting poor communication between MDs.      Exercises      General Comments General comments (skin integrity, edema, etc.): HR 90s-156 bpm A-fib, 02>95% on RA.      Pertinent Vitals/Pain Pain Assessment: No/denies pain    Home Living                      Prior Function            PT Goals (  current goals can now be found in the care plan section) Progress towards PT goals: Progressing toward goals    Frequency    Min 3X/week      PT Plan Current plan remains appropriate    Co-evaluation              AM-PAC PT "6 Clicks" Mobility   Outcome Measure  Help needed turning from your back to your side while in a flat bed without using bedrails?: A Little Help needed moving from lying on your back to sitting on the side of a flat bed without using bedrails?: A Little Help needed moving to and from a bed to a chair (including a  wheelchair)?: A Little Help needed standing up from a chair using your arms (e.g., wheelchair or bedside chair)?: A Little Help needed to walk in hospital room?: A Little Help needed climbing 3-5 steps with a railing? : A Little 6 Click Score: 18    End of Session Equipment Utilized During Treatment: Gait belt Activity Tolerance: Patient tolerated treatment well;Treatment limited secondary to medical complications (Comment) (dyspnea) Patient left: in chair;with call bell/phone within reach;with chair alarm set Nurse Communication: Mobility status PT Visit Diagnosis: Unsteadiness on feet (R26.81);Difficulty in walking, not elsewhere classified (R26.2)     Time: 7034-0352 PT Time Calculation (min) (ACUTE ONLY): 22 min  Charges:  $Therapeutic Exercise: 8-22 mins                     [name] [name], PT, DPT Acute Rehabilitation Services Pager [phone] Office [phone]       [name] [name] 01/25/2021, 11:25 AM

## 2021-01-25 NOTE — Progress Notes (Signed)
Heart Failure Nurse Navigator Progress Note  Heart Failure Navigator Progress Note  Assessed for Heart & Vascular TOC clinic readiness.  Unfortunately at this time the patient does not meet criteria due to increasing SCr and nephrology consult. Will continue to follow hospitalization to see if renal function improves.   Navigator available for reassessment of patient.   Pricilla Holm, RN, BSN Heart Failure Nurse Navigator [phone]

## 2021-01-25 NOTE — Progress Notes (Addendum)
  Amiodarone Drug - Drug Interaction Consult Note  Recommendations: 1. Consider restarting glimepiride (if restarting) at lower dose with regular glucose monitoring for the next several months. 2. Maintain potassium levels if restarting and while taking Lasix (diuretics). 3. Other considerations:  Ondansetron: Additive QT interval prolongation may occur during coadministration. This medication is ordered prn and has not been given up to this point. Atorvastatin: counsel patients to report any muscle pain or weakness immediately. PTA medications with interactions not currently ordered: Lasix: increased risk of cardiotoxicity if hypokalemia occurs. Glimepiride:  increased risk of hypoglycemia. Patient's glucose levels should be monitored closely when initiating amiodarone therapy.  4. Review new medications for amiodarone interactions.    Thank you for allowing [place] to participate in this patients care. Jens Som, PharmD 01/25/2021 10:31 AM  Please check [email or web address] for unit-specific pharmacy phone numbers. ---------------------------------------------------------------------------------   Amiodarone is metabolized by the cytochrome P450 system and therefore has the potential to cause many drug interactions. Amiodarone has an average plasma half-life of 50 days (range 20 to 100 days).   There is potential for drug interactions to occur several weeks or months after stopping treatment and the onset of drug interactions may be slow after initiating amiodarone.   [x]  Statins: Increased risk of myopathy. Simvastatin- restrict dose to 2m daily. Other statins: counsel patients to report any muscle pain or weakness immediately.  []  Anticoagulants: Amiodarone can increase anticoagulant effect. Consider warfarin dose reduction. Patients should be monitored closely and the dose of anticoagulant altered accordingly, remembering that amiodarone levels take several weeks to  stabilize.  []  Antiepileptics: Amiodarone can increase plasma concentration of phenytoin, the dose should be reduced. Note that small changes in phenytoin dose can result in large changes in levels. Monitor patient and counsel on signs of toxicity.  []  Beta blockers: increased risk of bradycardia, AV block and myocardial depression. Sotalol - avoid concomitant use.  []   Calcium channel blockers (diltiazem and verapamil): increased risk of bradycardia, AV block and myocardial depression.  []   Cyclosporine: Amiodarone increases levels of cyclosporine. Reduced dose of cyclosporine is recommended.  []  Digoxin dose should be halved when amiodarone is started.  [x]  Diuretics: increased risk of cardiotoxicity if hypokalemia occurs.  [x]  Oral hypoglycemic agents (glyburide, glipizide, glimepiride): increased risk of hypoglycemia. Patient's glucose levels should be monitored closely when initiating amiodarone therapy.   [x]  Drugs that prolong the QT interval:  Torsades de pointes risk may be increased with concurrent use - avoid if possible.  Monitor QTc, also keep magnesium/potassium WNL if concurrent therapy can't be avoided. .Marland KitchenAntibiotics: e.g. fluoroquinolones, erythromycin. . Antiarrhythmics: e.g. quinidine, procainamide, disopyramide, sotalol. . Antipsychotics: e.g. phenothiazines, haloperidol.  . Lithium, tricyclic antidepressants, and methadone.  []   Thyroid products: Amiodarone may diminish the therapeutic effect of Thyroid Products. Monitor the effectiveness of thyroid products, such as clinical signs and symptoms of hypothyroidism and/or serum thyroid stimulating hormone (TSH) concentrations, when starting or stopping amiodarone.    .Marland Kitchen

## 2021-01-26 ENCOUNTER — Inpatient Hospital Stay (HOSPITAL_COMMUNITY): Payer: Medicare Other

## 2021-01-26 DIAGNOSIS — I5042 Chronic combined systolic (congestive) and diastolic (congestive) heart failure: Secondary | ICD-10-CM | POA: Diagnosis not present

## 2021-01-26 DIAGNOSIS — I4891 Unspecified atrial fibrillation: Secondary | ICD-10-CM | POA: Diagnosis not present

## 2021-01-26 DIAGNOSIS — N179 Acute kidney failure, unspecified: Secondary | ICD-10-CM | POA: Diagnosis not present

## 2021-01-26 DIAGNOSIS — I5043 Acute on chronic combined systolic (congestive) and diastolic (congestive) heart failure: Secondary | ICD-10-CM | POA: Diagnosis not present

## 2021-01-26 LAB — RENAL FUNCTION PANEL
Albumin: 2.7 g/dL — ABNORMAL LOW (ref 3.5–5.0)
Anion gap: 9 (ref 5–15)
BUN: 74 mg/dL — ABNORMAL HIGH (ref 8–23)
CO2: 21 mmol/L — ABNORMAL LOW (ref 22–32)
Calcium: 8.4 mg/dL — ABNORMAL LOW (ref 8.9–10.3)
Chloride: 106 mmol/L (ref 98–111)
Creatinine, Ser: 2.88 mg/dL — ABNORMAL HIGH (ref 0.61–1.24)
GFR, Estimated: 21 mL/min — ABNORMAL LOW (ref >60)
Glucose, Bld: 154 mg/dL — ABNORMAL HIGH (ref 70–99)
Phosphorus: 3.8 mg/dL (ref 2.5–4.6)
Potassium: 4.4 mmol/L (ref 3.5–5.1)
Sodium: 136 mmol/L (ref 135–145)

## 2021-01-26 LAB — GLUCOSE, CAPILLARY
Glucose-Capillary: 149 mg/dL — ABNORMAL HIGH (ref 70–99)
Glucose-Capillary: 167 mg/dL — ABNORMAL HIGH (ref 70–99)
Glucose-Capillary: 197 mg/dL — ABNORMAL HIGH (ref 70–99)
Glucose-Capillary: 138 mg/dL — ABNORMAL HIGH (ref 70–99)
Glucose-Capillary: 167 mg/dL — ABNORMAL HIGH (ref 70–99)

## 2021-01-26 LAB — LACTIC ACID, PLASMA: Lactic Acid, Venous: 1.4 mmol/L (ref 0.5–1.9)

## 2021-01-26 LAB — MAGNESIUM: Magnesium: 2 mg/dL (ref 1.7–2.4)

## 2021-01-26 MED ORDER — LORAZEPAM 2 MG/ML IJ SOLN
1.0000 mg | Freq: Once | INTRAMUSCULAR | Status: AC
  Administered 2021-01-26: 1 mg via INTRAVENOUS
  Filled 2021-01-26: qty 1

## 2021-01-26 MED ORDER — CAMPHOR-MENTHOL 0.5-0.5 % EX LOTN
TOPICAL_LOTION | CUTANEOUS | Status: DC | PRN
  Filled 2021-01-26: qty 222

## 2021-01-26 MED ORDER — FUROSEMIDE 10 MG/ML IJ SOLN
120.0000 mg | Freq: Two times a day (BID) | INTRAVENOUS | Status: DC
  Administered 2021-01-26 – 2021-01-28 (×5): 120 mg via INTRAVENOUS
  Filled 2021-01-26 (×3): qty 12
  Filled 2021-01-26: qty 2
  Filled 2021-01-26: qty 10
  Filled 2021-01-26 (×2): qty 12

## 2021-01-26 MED ORDER — FUROSEMIDE 10 MG/ML IJ SOLN
120.0000 mg | Freq: Once | INTRAVENOUS | Status: AC
  Administered 2021-01-26: 120 mg via INTRAVENOUS
  Filled 2021-01-26: qty 10

## 2021-01-26 MED ORDER — HYDROXYZINE HCL 10 MG PO TABS
10.0000 mg | ORAL_TABLET | Freq: Three times a day (TID) | ORAL | Status: DC | PRN
Start: 2021-01-26 — End: 2021-01-26
  Administered 2021-01-26: 10 mg via ORAL
  Filled 2021-01-26: qty 1

## 2021-01-26 NOTE — Progress Notes (Addendum)
[facility] Progress Note    Assessment/ Plan:   87 year old male with CAD, CKD4 , CHF EF 40-45%, paroxysmal atrial fibrillation on Eliquis and amiodarone at home, HTN, UC, HTN, HLD, post right upper quadrant ileostomy who presented to the ED from [facility] due to worsening of SOB, orthopnea and LE edema and admitted for CHF exacerbation. He required agressive diuresis. His kidney function got worse>lasix stopped and nephrology consulted. [facility] baseline (1.6-1.9 in 07/2018 here at [facility]) as he's likely to have had progression. Needs to f/u with his nephrologist upon at the [place].  1. Worsening of kidney function: AKI on CKD, likely secondary to diuresis.   Cr today 2.8 from 2.92 (yesterday)< 3.11<3.33< I/O: net + 353 / 24 with lower UOP (546m) than yesterday.  Cr gradually trending down; we had been holding diuretics hoping for renal function improvement but at this time with his worsening dyspnea and e/o volume overload with dyspnea as well agree with aggressive diuresis.  -Fluid restriction -Avoid nephrotoxics  -palliative care consulted as patient is more toward palliative now.  2. CHF exacerbation: HF with moderately reduced EF: On Po Lasix 60 mg BBID at home. Received IV lasix here for CHF exacerbation that held due to worsening of kidney function as above.  SOB is better. He is on RA. He has mild to moderate hyper volumia on exam (JVP is up till mid-upper neck and has mild bibasilar crackle but no LEE.) Dr. SGardiner Rhyme cardiologist saw the patient as well while I was in the room. The plan will be holding diuretic today to help with kidney function. Milrinone stopped per Dr. SGardiner Rhymeas patient seems to have good perfusion.  -Restarting diuretic due to worsening of volume overload  w IV lasix 120 mg once per cardiology  -Per cardiology: -LA ordered yesterday and was elevated>improved -Not on ACE I, ARB given worsening of kidney function -Not on BB -palliative care  consulted as patient is more toward palliative now.  3. PAF: Patient is currently on Afib. HR is elevated at 100-120. BP was soft this AM but now better at 120s/70s. Asymptomatic. BP stable. On Amiodarone and Eliquis at home. -Amiodarone was resumed yesterday. Was on Sinuse for a while and again is in Afib. -Per cardiology -On Eliquis 2.5 mg BID -Cardiac monitoring  4. HLD: on statin 5. Hx of HTN: Has had soft BP here. On Midodrine 10 mg PO TID Subjective:   Patient was seen and evaluated at bedside this morning rounds. He reports shortness of breath. No chest pain. No other complaint. He states that he does not want more aggressive treatment or some procedure and wants to be more comfortable.    Objective:   BP (!) 94/54 (BP Location: Left Wrist)   Pulse 74   Temp (!) 97.4 F (36.3 C) (Oral)   Resp 18   Ht 5' 10"  (1.778 m)   Wt 113.3 kg   SpO2 98%   BMI 35.84 kg/m   Intake/Output Summary (Last 24 hours) at 01/26/2021 0840 Last data filed at 01/26/2021 08115Gross per 24 hour  Intake 943.13 ml  Output 650 ml  Net 293.13 ml   Weight change: 0.544 kg  Physical Exam: Gen: Uncomfortable due to SOB CVS: Irregular rhythm. No murmur. JVD  Resp: Has crackles in lower and mid chest bilateraly.  Abd: soft. Non tender to palpation Ext: Warm. No LEE  Imaging: No results found.  Labs: BMET Recent Labs  Lab 01/21/21 0234 01/22/21 0150 01/23/21 0332 01/23/21  7588 01/24/21 0218 01/25/21 0402 01/26/21 0235  NA 137 136 135 133* 136 136 136  K 4.4 3.8 4.7 5.0 4.4 4.4 4.4  CL 108 105 103 102 106 108 106  CO2 20* 23 22 22 23  20* 21*  GLUCOSE 120* 139* 111* 227* 122* 153* 154*  BUN 60* 58* 72* 71* 79* 75* 74*  CREATININE 2.88* 2.75* 3.30* 3.33* 3.11* 2.92* 2.88*  CALCIUM 7.9* 8.0* 8.1* 8.2* 7.9* 8.1* 8.4*  PHOS  --   --   --   --  4.5  --  3.8   CBC Recent Labs  Lab 01/20/21 1731 01/21/21 0234 01/24/21 0218  WBC 13.1* 11.2* 11.3*  NEUTROABS 9.8* 8.4* 8.4*  HGB  10.3* 9.6* 8.8*  HCT 33.8* 31.1* 28.2*  MCV 90.6 90.4 89.0  PLT 328 295 303    Medications:    . apixaban  2.5 mg Oral BID  . atorvastatin  40 mg Oral q1800  . insulin aspart  0-5 Units Subcutaneous QHS  . insulin aspart  0-9 Units Subcutaneous TID WC  . midodrine  10 mg Oral TID WC  . pantoprazole  40 mg Oral BID  . sodium chloride flush  3 mL Intravenous Q12H    Signed: Dewayne Hatch, MD 01/26/2021, 8:40 AM

## 2021-01-26 NOTE — Progress Notes (Signed)
Progress Note  Patient Name: [name] [name] Date of Encounter: 01/26/2021  [facility] Cardiologist: [name], MD   Subjective   Reports worsening dyspnea  Inpatient Medications    Scheduled Meds: . apixaban  2.5 mg Oral BID  . atorvastatin  40 mg Oral q1800  . insulin aspart  0-5 Units Subcutaneous QHS  . insulin aspart  0-9 Units Subcutaneous TID WC  . midodrine  10 mg Oral TID WC  . pantoprazole  40 mg Oral BID  . sodium chloride flush  3 mL Intravenous Q12H   Continuous Infusions: . sodium chloride    . amiodarone 30 mg/hr (01/26/21 0802)   PRN Meds: sodium chloride, acetaminophen, ondansetron (ZOFRAN) IV, sodium chloride flush   Vital Signs    Vitals:   01/25/21 1934 01/26/21 0051 01/26/21 0355 01/26/21 0432  BP: 122/67 99/69  (!) 94/54  Pulse: (!) 106 74    Resp: 18 20  18   Temp: 97.7 F (36.5 C) 98.1 F (36.7 C)  (!) 97.4 F (36.3 C)  TempSrc: Oral Oral  Oral  SpO2: 100% 98%  98%  Weight:   113.3 kg   Height:        Intake/Output Summary (Last 24 hours) at 01/26/2021 0905 Last data filed at 01/26/2021 8469 Gross per 24 hour  Intake 943.13 ml  Output 650 ml  Net 293.13 ml   Last 3 Weights 01/26/2021 01/25/2021 01/24/2021  Weight (lbs) 249 lb 12.8 oz 248 lb 9.6 oz 248 lb 3.8 oz  Weight (kg) 113.309 kg 112.764 kg 112.6 kg      Telemetry    NSR 60s last night now in AF with 100-120s - Personally Reviewed  ECG    No new ECG- Personally Reviewed  Physical Exam   GEN: No acute distress.   Neck: + JVD Cardiac: irregular, tachycardic, no murmurs, rubs, or gallops.  Respiratory: Bibasilar crackles GI: Soft, nontender, non-distended  MS: Trace edema; No deformity. Neuro:  Nonfocal  Psych: Normal affect   Labs    High Sensitivity Troponin:   Recent Labs  Lab 01/20/21 1731 01/20/21 1926 01/23/21 0332  TROPONINIHS 20* 19* 22*      Chemistry Recent Labs  Lab 01/20/21 1731 01/21/21 0234 01/24/21 0218 01/25/21 0402 01/26/21 0235   NA 135   < > 136 136 136  K 4.4   < > 4.4 4.4 4.4  CL 106   < > 106 108 106  CO2 20*   < > 23 20* 21*  GLUCOSE 96   < > 122* 153* 154*  BUN 60*   < > 79* 75* 74*  CREATININE 2.94*   < > 3.11* 2.92* 2.88*  CALCIUM 8.2*   < > 7.9* 8.1* 8.4*  PROT 6.9  --   --   --   --   ALBUMIN 2.7*  --   --   --  2.7*  AST 24  --   --   --   --   ALT 16  --   --   --   --   ALKPHOS 155*  --   --   --   --   BILITOT 0.4  --   --   --   --   GFRNONAA 20*   < > 19* 20* 21*  ANIONGAP 9   < > 7 8 9    < > = values in this interval not displayed.     Hematology Recent Labs  Lab 01/20/21  1731 01/21/21 0234 01/24/21 0218  WBC 13.1* 11.2* 11.3*  RBC 3.73* 3.44* 3.17*  HGB 10.3* 9.6* 8.8*  HCT 33.8* 31.1* 28.2*  MCV 90.6 90.4 89.0  MCH 27.6 27.9 27.8  MCHC 30.5 30.9 31.2  RDW 14.6 14.6 14.7  PLT 328 295 303    BNP Recent Labs  Lab 01/20/21 1731  BNP 705.9*     DDimer No results for input(s): DDIMER in the last 168 hours.   Radiology    No results found.  Cardiac Studies   Echo 01/22/21: 1. Left ventricular ejection fraction, by estimation, is 20 to 25%. The  left ventricle has severely decreased function. The left ventricle  demonstrates global hypokinesis. There is mild left ventricular  hypertrophy. Left ventricular diastolic parameters  are indeterminate.  2. Right ventricular systolic function is moderately reduced. The right  ventricular size is mildly enlarged.  3. The mitral valve is normal in structure. Moderate mitral valve  regurgitation.  4. The aortic valve is tricuspid. Aortic valve regurgitation is trivial.  Mild to moderate aortic valve sclerosis/calcification is present, without  any evidence of aortic stenosis.   Patient Profile     87 y.o. male with a hx of CAD status post DES to the RI and CTO of the LAD,paroxysmal atrial fibrillation/flutter, chronic systolic anddiastolic CHF, CKD IV, HLD and hypertensionwho is being seen for the evaluation ofCHF  and AFib RVR. TTE wit EF 20-25%  With moderate MR   Assessment & Plan    Acute on chronic systolic heart failure, EF 20-25%: Admitted with volume overload and AKI.  BNP elevated.  Chest x-ray with pulmonary edema.  Echo with EF 20-25% (previously 40-45%. -Initially diuresed with IV Lasix, had worsening kidney function.  Lasix held.  Creatinine appears to be improving.   -Started on empiric milrinone on 4/10 but reports felt worse with this and refused further milrinone.   -Given his CKD stage IV and poor functional status, he is not a candidate for invasive work-up of his cardiomyopathy -Cr improved with holding diuresis but now with worsening dyspnea.  Worsening hypervolemia on exam.  Discussed that if pursuing aggressive care, would recommend RHC and likely initiation of inotropes and aggressive diuresis.  But did discuss that prognosis is poor given his age, severe heart failure, CKD IV.  Discussed this with patient and his sister.  He declines any procedures, reports that he would like to focus on comfort measures.  Recommend palliative care consult for hospice evaluation.  Will diurese with lasix 120 mg this morning   Atrial fibrillation with RVR : Has been in and out of A. fib.  Suspect this is being driven by his congestive heart failure.  -Amiodarone was stopped on admission.  He is going in and out of AF, restarted amio to try and maintain sinus rhythm -Currently not on AV nodal blockers. -No plans for procedures.  Continue Eliquis 2.5 mg twice daily.  This is an appropriate dose for his age and kidney function.    CAD : Cath in 2015 with CTO of the LAD.  Status post drug-eluting stent to the ramus.  -Troponins are flat negative.  No symptoms of angina.    Acute on chronic CKD: Now CKD stage IV.  Avoid nephrotoxic agents.   For questions or updates, please contact [place] Please consult [email or web address] for contact info under        Signed, Donato Heinz, MD   01/26/2021, 9:05 AM

## 2021-01-26 NOTE — Progress Notes (Addendum)
Patient is more anxious this  am ativan given early this morning MD notified will continue to monitor. V/SS BP 109/77, HR 97 still afib.

## 2021-01-26 NOTE — Progress Notes (Addendum)
PROGRESS NOTE  [name] [name]:[id number] DOB: 05-09-1933 DOA: 01/20/2021 PCP: [name], MD  HPI/Recap of past 24 hours: GRAVES NIPP is an 87 y.o. male with medical history significant of coronary artery disease, paroxysmal atrial fibrillation on Eliquis, chronic combined diastolic and systolic CHF EF 40 to 44%, CKD 4, hypertension, hypothyroidism, hyperlipidemia, post right upper quadrant ileostomy, who presented to the ED from ALF due to worsening dyspnea and bilateral lower extremity edema.  Work-up revealed acute on chronic combined diastolic and systolic CHF.  Admitted by [facility].  Seen by cardiology.  Repeated 2D echo revealed worsening combined diastolic and systolic CHF with LVEF of 20 to 25% from 40 to 45% previously.  Initially diuresed however became hypotensive requiring midodrine to maintain MAP greater than 65 and his kidney function worsened.  IV diuresing was held by cardiology.  Oral antihypertensives were also held.  Was started on milrinone drip on 01/24/2021 but did not tolerate, stated that his symptoms were worse while on milrinone drip.  Due to going in and out of A. fib he was started on amiodarone drip on 01/25/2021.  01/26/21: Patient was seen and examined at bedside.  He reports dyspnea at rest.  Obtained a chest x-ray which showed pulmonary edema.   Assessment/Plan: Principal Problem:   Chronic combined systolic and diastolic CHF (congestive heart failure) ([name]) Active Problems:   CAD (coronary artery disease)   Diabetes mellitus with diabetic nephropathy without long-term current use of insulin ([name])   Acute renal failure superimposed on stage 3 chronic kidney disease ([name])   GERD (gastroesophageal reflux disease)   HTN (hypertension)   Paroxysmal atrial fibrillation ([name])  Acute on chronic combined diastolic and systolic CHF Last 2D echo done on 03/14/2018 revealed LVEF 40 to 45% with grade 1 diastolic dysfunction Repeated 2D echo done on 01/22/2021 revealed LVEF  20 to 25%, left ventricle demonstrate global hypokinesis.  Moderate mitral valve regurgitation. Beta-blocker on hold, due to hypotension.  Currently on midodrine 10 mg 3 times daily. IV Lasix 120 mg x 1 dose ordered by cardiology on 01/26/2021 due to dyspnea. Net I&O -2.5 L Was started on palliative milrinone on 01/24/2021, patient did not tolerate.  Amiodarone drip started on 01/25/2021 by cardiology for coming in and out of A. fib with RVR. Management per cardiology  Paroxysmal A. fib with RVR Started on amiodarone drip by cardiology on 01/25/2021 due to going in and out of A. fib with RVR.Marland Kitchen Continue Eliquis for primary CVA prevention Continue to monitor on telemetry.  Prolonged QTC Twelve-lead EKG done on 01/26/2021 showed QTC 530 Hold off QTC prolonging agents Hold off Protonix for now Hold off hydroxyzine Optimize potassium and magnesium level Serum magnesium 4.4. Obtain magnesium level and replace as needed  AKI on CKD 4 suspect cardiorenal Baseline creatinine appears to be 2.7 with GFR of 22. Creatinine downtrending 2.88 from 2.92 Creatinine previously 3.11 from 3.33 Continue to avoid nephrotoxic agents and hypotension Monitor urine output. Repeat renal panel in the morning  Elevated troponin, suspect demand ischemia Troponin peaked at 22 on 01/23/2021 As denies anginal symptoms. Management per cardiology.  Hypotension on midodrine Continue midodrine 10 mg 3 times daily Has received IV albumin 25 g every 6 hours x2 doses on 01/23/2021 Continue to maintain MAP greater than 65. Continue to closely monitor vital signs.  Resolved post treatment with Lokelma 5 mg x 1 dose, mild hyperkalemia 5.0, in the setting of AKI on CKD 4 Serum potassium 4.4. Has received a dose  of Lokelma on 01/23/2021.  Leukocytosis suspect reactive in the setting of acute illness Presented with WBC 13 point 1K, downtrended to 11 point 2K. Not on antibiotics. Afebrile, non toxic appearing Repeat CBC in  the morning  Hyperlipidemia Continue home Lipitor 40 mg daily.  Type 2 diabetes with hyperglycemia Hemoglobin A1c 6.6 on 01/21/2021 Continue insulin sliding scale.  GERD Continue home PPI twice daily.  Goals of care Palliative care team consulted to assist with establishing goals of care Currently DNR.  Situational anxiety Received 1 dose of Ativan earlier this morning prior to beginning of shift. Avoid benzodiazepines Obtain 12 lead EKG to assess QTC.  If no QTC prolongation can consider small dose of P.o. hydroxyzine as needed for anxiety. Twelve-lead EKG ordered on 01/26/2021.     Code Status: DNR  Family Communication: Updated his sister in person on 01/26/2021.  Disposition Plan: Likely will discharge to ALF, patient came from ALF   Consultants:  Cardiology  Nephrology.  Palliative care team.  Procedures:  None  Antimicrobials:  None.  DVT prophylaxis: Eliquis  Status is: Inpatient    Dispo: The patient is from: ALF              Anticipated d/c is to: ALF on 01/27/2021 or when cardiology and nephrology sign off.               Patient currently not stable for discharge due to hypotension, worsening renal function.   Difficult to place patient: Not applicable.        Objective: Vitals:   01/26/21 0432 01/26/21 0918 01/26/21 0921 01/26/21 1210  BP: (!) 94/54   (!) 140/94  Pulse:    (!) 113  Resp: 18     Temp: (!) 97.4 F (36.3 C)   97.8 F (36.6 C)  TempSrc: Oral   Oral  SpO2: 98% 93% 95% 96%  Weight:      Height:        Intake/Output Summary (Last 24 hours) at 01/26/2021 1625 Last data filed at 01/26/2021 1500 Gross per 24 hour  Intake 761.26 ml  Output 800 ml  Net -38.74 ml   Filed Weights   01/24/21 0342 01/25/21 0427 01/26/21 0355  Weight: 112.6 kg 112.8 kg 113.3 kg    Exam:  . General: 87 y.o. year-old male chronically ill-appearing no acute distress.  He is alert oriented x3.   . Cardiovascular: Irregular rate and  rhythm no rubs gallops. Marland Kitchen Respiratory: Mild rales at bases no wheezing noted.  Poor inspiratory effort.  . Abdomen: Obese nontender bowel sounds present.  Ileostomy bag in place.   . Musculoskeletal: Trace lower extremity edema bilaterally.   . Skin: No ulcerative lesions noted. Marland Kitchen Psychiatry: Mood is anxious.   Data Reviewed: CBC: Recent Labs  Lab 01/20/21 1731 01/21/21 0234 01/24/21 0218  WBC 13.1* 11.2* 11.3*  NEUTROABS 9.8* 8.4* 8.4*  HGB 10.3* 9.6* 8.8*  HCT 33.8* 31.1* 28.2*  MCV 90.6 90.4 89.0  PLT 328 295 116   Basic Metabolic Panel: Recent Labs  Lab 01/23/21 0332 01/23/21 0943 01/24/21 0218 01/25/21 0402 01/26/21 0235  NA 135 133* 136 136 136  K 4.7 5.0 4.4 4.4 4.4  CL 103 102 106 108 106  CO2 22 22 23  20* 21*  GLUCOSE 111* 227* 122* 153* 154*  BUN 72* 71* 79* 75* 74*  CREATININE 3.30* 3.33* 3.11* 2.92* 2.88*  CALCIUM 8.1* 8.2* 7.9* 8.1* 8.4*  MG  --   --  1.9  --   --  PHOS  --   --  4.5  --  3.8   GFR: Estimated Creatinine Clearance: 23.6 mL/min (A) (by C-G formula based on SCr of 2.88 mg/dL (H)). Liver Function Tests: Recent Labs  Lab 01/20/21 1731 01/26/21 0235  AST 24  --   ALT 16  --   ALKPHOS 155*  --   BILITOT 0.4  --   PROT 6.9  --   ALBUMIN 2.7* 2.7*   No results for input(s): LIPASE, AMYLASE in the last 168 hours. No results for input(s): AMMONIA in the last 168 hours. Coagulation Profile: No results for input(s): INR, PROTIME in the last 168 hours. Cardiac Enzymes: No results for input(s): CKTOTAL, CKMB, CKMBINDEX, TROPONINI in the last 168 hours. BNP (last 3 results) No results for input(s): PROBNP in the last 8760 hours. HbA1C: No results for input(s): HGBA1C in the last 72 hours. CBG: Recent Labs  Lab 01/25/21 2104 01/26/21 0558 01/26/21 0611 01/26/21 1114 01/26/21 1608  GLUCAP 209* 167* 149* 197* 138*   Lipid Profile: No results for input(s): CHOL, HDL, LDLCALC, TRIG, CHOLHDL, LDLDIRECT in the last 72 hours. Thyroid  Function Tests: No results for input(s): TSH, T4TOTAL, FREET4, T3FREE, THYROIDAB in the last 72 hours. Anemia Panel: No results for input(s): VITAMINB12, FOLATE, FERRITIN, TIBC, IRON, RETICCTPCT in the last 72 hours. Urine analysis:    Component Value Date/Time   COLORURINE YELLOW 07/26/2018 1148   APPEARANCEUR HAZY (A) 07/26/2018 1148   LABSPEC 1.009 07/26/2018 1148   PHURINE 5.0 07/26/2018 1148   GLUCOSEU NEGATIVE 07/26/2018 1148   HGBUR LARGE (A) 07/26/2018 1148   BILIRUBINUR NEGATIVE 07/26/2018 [place] 07/26/2018 1148   PROTEINUR NEGATIVE 07/26/2018 1148   UROBILINOGEN 0.2 10/22/2014 1052   NITRITE NEGATIVE 07/26/2018 1148   LEUKOCYTESUR LARGE (A) 07/26/2018 1148   Sepsis Labs: @LABRCNTIP (procalcitonin:4,lacticidven:4)  ) Recent Results (from the past 240 hour(s))  SARS CORONAVIRUS 2 (TAT 6-24 HRS) Nasopharyngeal Nasopharyngeal Swab     Status: None   Collection Time: 01/20/21  4:35 PM   Specimen: Nasopharyngeal Swab  Result Value Ref Range Status   SARS Coronavirus 2 NEGATIVE NEGATIVE Final    Comment: (NOTE) SARS-CoV-2 target nucleic acids are NOT DETECTED.  The SARS-CoV-2 RNA is generally detectable in upper and lower respiratory specimens during the acute phase of infection. Negative results do not preclude SARS-CoV-2 infection, do not rule out co-infections with other pathogens, and should not be used as the sole basis for treatment or other patient management decisions. Negative results must be combined with clinical observations, patient history, and epidemiological information. The expected result is Negative.  Fact Sheet for Patients: SugarRoll.be  Fact Sheet for Healthcare Providers: https://www.woods-mathews.com/  This test is not yet approved or cleared by the [place] FDA and  has been authorized for detection and/or diagnosis of SARS-CoV-2 by FDA under an Emergency Use Authorization  (EUA). This EUA will remain  in effect (meaning this test can be used) for the duration of the COVID-19 declaration under Se ction 564(b)(1) of the Act, 21 U.S.C. section 360bbb-3(b)(1), unless the authorization is terminated or revoked sooner.  Performed at [facility], [place] [address] [place], [place] [address]   SARS CORONAVIRUS 2 (TAT 6-24 HRS) Nasopharyngeal Nasopharyngeal Swab     Status: None   Collection Time: 01/23/21  4:42 AM   Specimen: Nasopharyngeal Swab  Result Value Ref Range Status   SARS Coronavirus 2 NEGATIVE NEGATIVE Final    Comment: (NOTE) SARS-CoV-2 target nucleic  acids are NOT DETECTED.  The SARS-CoV-2 RNA is generally detectable in upper and lower respiratory specimens during the acute phase of infection. Negative results do not preclude SARS-CoV-2 infection, do not rule out co-infections with other pathogens, and should not be used as the sole basis for treatment or other patient management decisions. Negative results must be combined with clinical observations, patient history, and epidemiological information. The expected result is Negative.  Fact Sheet for Patients: SugarRoll.be  Fact Sheet for Healthcare Providers: https://www.woods-mathews.com/  This test is not yet approved or cleared by the [place] FDA and  has been authorized for detection and/or diagnosis of SARS-CoV-2 by FDA under an Emergency Use Authorization (EUA). This EUA will remain  in effect (meaning this test can be used) for the duration of the COVID-19 declaration under Se ction 564(b)(1) of the Act, 21 U.S.C. section 360bbb-3(b)(1), unless the authorization is terminated or revoked sooner.  Performed at [facility], [place] [address] [place], [place] [address]       Studies: DG CHEST PORT 1 VIEW  Result Date: 01/26/2021 CLINICAL DATA:  Dyspnea EXAM: PORTABLE CHEST 1 VIEW COMPARISON:  01/23/2021 FINDINGS: Cardiac  shadow remains enlarged. Aortic calcifications are again noted. Vascular congestion is again seen slightly increased from the prior exam with mild interstitial edema. No sizable effusion is noted. No acute bony abnormality is seen. IMPRESSION: Increased vascular congestion with mild interstitial edema. Electronically Signed   By: Inez Catalina M.D.   On: 01/26/2021 11:06    Scheduled Meds: . apixaban  2.5 mg Oral BID  . atorvastatin  40 mg Oral q1800  . insulin aspart  0-5 Units Subcutaneous QHS  . insulin aspart  0-9 Units Subcutaneous TID WC  . midodrine  10 mg Oral TID WC  . sodium chloride flush  3 mL Intravenous Q12H    Continuous Infusions: . sodium chloride    . amiodarone 30 mg/hr (01/26/21 0802)     LOS: 6 days     Kayleen Memos, MD [facility] Pager [phone]  If 7PM-7AM, please contact night-coverage [email or web address] Password [facility] 01/26/2021, 4:25 PM

## 2021-01-27 DIAGNOSIS — I5042 Chronic combined systolic (congestive) and diastolic (congestive) heart failure: Secondary | ICD-10-CM | POA: Diagnosis not present

## 2021-01-27 DIAGNOSIS — I4891 Unspecified atrial fibrillation: Secondary | ICD-10-CM | POA: Diagnosis not present

## 2021-01-27 DIAGNOSIS — I5043 Acute on chronic combined systolic (congestive) and diastolic (congestive) heart failure: Secondary | ICD-10-CM | POA: Diagnosis not present

## 2021-01-27 LAB — CBC WITH DIFFERENTIAL/PLATELET
Abs Immature Granulocytes: 0.03 10*3/uL (ref 0.00–0.07)
Basophils Absolute: 0.1 10*3/uL (ref 0.0–0.1)
Basophils Relative: 1 %
Eosinophils Absolute: 0.4 10*3/uL (ref 0.0–0.5)
Eosinophils Relative: 4 %
HCT: 31.6 % — ABNORMAL LOW (ref 39.0–52.0)
Hemoglobin: 9.7 g/dL — ABNORMAL LOW (ref 13.0–17.0)
Immature Granulocytes: 0 %
Lymphocytes Relative: 15 %
Lymphs Abs: 1.4 10*3/uL (ref 0.7–4.0)
MCH: 27.4 pg (ref 26.0–34.0)
MCHC: 30.7 g/dL (ref 30.0–36.0)
MCV: 89.3 fL (ref 80.0–100.0)
Monocytes Absolute: 0.9 10*3/uL (ref 0.1–1.0)
Monocytes Relative: 9 %
Neutro Abs: 6.7 10*3/uL (ref 1.7–7.7)
Neutrophils Relative %: 71 %
Platelets: 318 10*3/uL (ref 150–400)
RBC: 3.54 MIL/uL — ABNORMAL LOW (ref 4.22–5.81)
RDW: 14.6 % (ref 11.5–15.5)
WBC: 9.4 10*3/uL (ref 4.0–10.5)
nRBC: 0 % (ref 0.0–0.2)

## 2021-01-27 LAB — BASIC METABOLIC PANEL
Anion gap: 8 (ref 5–15)
BUN: 71 mg/dL — ABNORMAL HIGH (ref 8–23)
CO2: 23 mmol/L (ref 22–32)
Calcium: 8.4 mg/dL — ABNORMAL LOW (ref 8.9–10.3)
Chloride: 107 mmol/L (ref 98–111)
Creatinine, Ser: 2.75 mg/dL — ABNORMAL HIGH (ref 0.61–1.24)
GFR, Estimated: 22 mL/min — ABNORMAL LOW (ref >60)
Glucose, Bld: 152 mg/dL — ABNORMAL HIGH (ref 70–99)
Potassium: 3.9 mmol/L (ref 3.5–5.1)
Sodium: 138 mmol/L (ref 135–145)

## 2021-01-27 LAB — BRAIN NATRIURETIC PEPTIDE: B Natriuretic Peptide: 1614.4 pg/mL — ABNORMAL HIGH (ref 0.0–100.0)

## 2021-01-27 LAB — PHOSPHORUS: Phosphorus: 4.3 mg/dL (ref 2.5–4.6)

## 2021-01-27 LAB — GLUCOSE, CAPILLARY
Glucose-Capillary: 150 mg/dL — ABNORMAL HIGH (ref 70–99)
Glucose-Capillary: 140 mg/dL — ABNORMAL HIGH (ref 70–99)
Glucose-Capillary: 162 mg/dL — ABNORMAL HIGH (ref 70–99)

## 2021-01-27 LAB — MAGNESIUM: Magnesium: 1.9 mg/dL (ref 1.7–2.4)

## 2021-01-27 MED ORDER — TRAZODONE HCL 50 MG PO TABS
50.0000 mg | ORAL_TABLET | Freq: Every day | ORAL | Status: DC
  Administered 2021-01-27 – 2021-01-28 (×2): 50 mg via ORAL
  Filled 2021-01-27 (×2): qty 1

## 2021-01-27 MED ORDER — AMIODARONE HCL 200 MG PO TABS
200.0000 mg | ORAL_TABLET | Freq: Two times a day (BID) | ORAL | Status: DC
  Administered 2021-01-27 – 2021-01-29 (×5): 200 mg via ORAL
  Filled 2021-01-27 (×5): qty 1

## 2021-01-27 MED ORDER — LORAZEPAM 2 MG/ML IJ SOLN
1.0000 mg | Freq: Once | INTRAMUSCULAR | Status: AC
  Administered 2021-01-27: 1 mg via INTRAVENOUS
  Filled 2021-01-27: qty 1

## 2021-01-27 MED ORDER — ALPRAZOLAM 0.5 MG PO TABS
0.5000 mg | ORAL_TABLET | Freq: Two times a day (BID) | ORAL | Status: DC | PRN
  Administered 2021-01-27 – 2021-01-29 (×3): 0.5 mg via ORAL
  Filled 2021-01-27 (×3): qty 1

## 2021-01-27 MED ORDER — DM-GUAIFENESIN ER 30-600 MG PO TB12: 1.0000 | ORAL_TABLET | Freq: Two times a day (BID) | ORAL | Status: DC | PRN

## 2021-01-27 MED ORDER — SENNOSIDES-DOCUSATE SODIUM 8.6-50 MG PO TABS: 1.0000 | ORAL_TABLET | Freq: Every evening | ORAL | Status: DC | PRN

## 2021-01-27 NOTE — Progress Notes (Signed)
Physical Therapy Treatment Patient Details Name: [name] [name] MRN: [id number] DOB: 06/26/1933 Today's Date: 01/27/2021    History of Present Illness Patient is a 87 y/o male who presents on 01/21/21 with dyspnea and bilateral lower extremity edema.  Work-up revealed acute on chronic combined diastolic and systolic CHF complicated by A-fib with RVR. CXR-pulmonary edema. PMH includes colectomy, colostomy, paroxysmal A-fib, HTN, DM, dilated cardiomyopathy with EF 35-40%, CKD, CAD.    PT Comments    Patient in better spirits this AM. Tolerated transfers and gait training with supervision-Min guard assist for balance/safety. Pt walking shorter distances each session likely due to deconditioning and debility from lack of mobility while being in the hospital. HR up to 129 bpm max with activity, 3/4 DOE. Sp02 remained in mid 90s on RA. Fatigues quickly. Recommend increasing activity as able and working with mobility tech to improve overall endurance, strength and mobility. If pt continues to not improve, will likely need HHPT. Will follow.   Follow Up Recommendations  No PT follow up;Supervision - Intermittent     Equipment Recommendations  None recommended by PT    Recommendations for Other Services       Precautions / Restrictions Precautions Precautions: Fall;Other (comment) Precaution Comments: watch HR Restrictions Weight Bearing Restrictions: No    Mobility  Bed Mobility Overal bed mobility: Needs Assistance Bed Mobility: Sit to Supine       Sit to supine: Min assist;HOB elevated   General bed mobility comments: Assist to bring LLE into bed to return to supine.    Transfers Overall transfer level: Needs assistance Equipment used: Rolling walker (2 wheeled) Transfers: Sit to/from [facility] Sit to Stand: Supervision Stand pivot transfers: Min guard       General transfer comment: Supervision for safety. Stood from Automotive engineer. Good demo of hand placement.  Transferred back to bed post mobility.  Ambulation/Gait Ambulation/Gait assistance: Min guard Gait Distance (Feet): 60 Feet Assistive device: Rolling walker (2 wheeled) Gait Pattern/deviations: Step-through pattern;Decreased stride length;Trunk flexed Gait velocity: decreased Gait velocity interpretation: <1.31 ft/sec, indicative of household ambulator General Gait Details: Slow, mildly unsteady gait with RW for support; 3/4 DOE. HR up to 129 bpm, Sp02 95% on RA. 1 standing rest break.   Stairs             Wheelchair Mobility    Modified Rankin (Stroke Patients Only)       Balance Overall balance assessment: Needs assistance Sitting-balance support: Feet supported;No upper extremity supported Sitting balance-[name]: Good     Standing balance support: During functional activity Standing balance-[name]: Poor                              Cognition Arousal/Alertness: Awake/alert Behavior During Therapy: WFL for tasks assessed/performed Overall Cognitive Status: Within Functional Limits for tasks assessed                                 General Comments: Seems to be in better spirits today.      Exercises      General Comments General comments (skin integrity, edema, etc.): HR up to 129 bpm max with activity, 3/4 DOE. Sp02 remained in mid 90s on RA.      Pertinent Vitals/Pain Pain Assessment: No/denies pain    Home Living  Prior Function            PT Goals (current goals can now be found in the care plan section) Progress towards PT goals: Progressing toward goals (slowly)    Frequency    Min 3X/week      PT Plan Current plan remains appropriate    Co-evaluation              AM-PAC PT "6 Clicks" Mobility   Outcome Measure  Help needed turning from your back to your side while in a flat bed without using bedrails?: A Little Help needed moving from lying on your back to  sitting on the side of a flat bed without using bedrails?: A Little Help needed moving to and from a bed to a chair (including a wheelchair)?: A Little Help needed standing up from a chair using your arms (e.g., wheelchair or bedside chair)?: A Little Help needed to walk in hospital room?: A Little Help needed climbing 3-5 steps with a railing? : A Little 6 Click Score: 18    End of Session Equipment Utilized During Treatment: Gait belt Activity Tolerance: Patient tolerated treatment well Patient left: in bed;with call bell/phone within reach;with bed alarm set Nurse Communication: Mobility status PT Visit Diagnosis: Unsteadiness on feet (R26.81);Difficulty in walking, not elsewhere classified (R26.2)     Time: 0943-1000 PT Time Calculation (min) (ACUTE ONLY): 17 min  Charges:  $Therapeutic Exercise: 8-22 mins                     [name], PT, DPT Acute Rehabilitation Services Pager [phone] Office [phone]       [name] 01/27/2021, 12:19 PM

## 2021-01-27 NOTE — Progress Notes (Addendum)
[facility] Progress Note    Assessment/ Plan:   87 year old male with CAD, CKD4 , CHF EF 40-45%, paroxysmal atrial fibrillation on Eliquis and amiodarone at home, HTN, UC, HTN, HLD, post right upper quadrant ileostomy who presented to the ED from [facility] due to worsening of SOB, orthopnea and LE edema and admitted for CHF exacerbation. He required agressive diuresis. His kidney function got worse>lasix stopped and nephrology consulted. [place] clinic baseline (1.6-1.9 in 07/2018 here at [facility]) as he's likely to have had progression. Needs to f/u with his nephrologist upon at the [place].  1. Worsening of kidney function: AKI on CKD, likely secondary to diuresis.   Cr today 2.75 (improved from 2.88 yesterday< 2.92< 3.11<3.33<   His kidney function slightly improved and tolerated diuresis yesterday and net negative 1800 yesterday after starting 120 mg IV lasix BID yesterday.  He lost 6 lb since admission.  Not aware of recent dry weight but and last documented Wt with euvolemic exam found in not by Dr. Radford Pax on February 2021. His wt was 242 lb (109.8 kg) then.  Continue aggressive diuresis and fortunately renal function remains stable or actually slowly improving.   Will sign off at this time; please reconsult as needed.  -Palliative care consulted as patient is more toward palliative now.  BMP Latest Ref Rng & Units 01/27/2021 01/26/2021 01/25/2021  Glucose 70 - 99 mg/dL 152(H) 154(H) 153(H)  BUN 8 - 23 mg/dL 71(H) 74(H) 75(H)  Creatinine 0.61 - 1.24 mg/dL 2.75(H) 2.88(H) 2.92(H)  BUN/Creat Ratio 10 - 24 - - -  Sodium 135 - 145 mmol/L 138 136 136  Potassium 3.5 - 5.1 mmol/L 3.9 4.4 4.4  Chloride 98 - 111 mmol/L 107 106 108  CO2 22 - 32 mmol/L 23 21(L) 20(L)  Calcium 8.9 - 10.3 mg/dL 8.4(L) 8.4(L) 8.1(L)   2. CHF exacerbation: HF with moderately reduced EF: On Po Lasix 60 mg BBID at home. Received IV lasix here for CHF exacerbation that held due to worsening of kidney function as  above.  Resumed Lasix yesterday due to volume overload and SOB and pulmonary copngestion.  -Can continue Lasix today and will reassess tomorrow -Not on ACE I, ARB given worsening of kidney function -Not on BB -Palliative care consulted as patient is more toward palliative now. Will f/u palliative care rec after [place] discussion w patient  3. PAF: Patient is currently on Afib. HR is elevated at 100-120. BP was soft this AM but now better at 120s/70s. Asymptomatic. BP stable. On Amiodarone and Eliquis at home. -Amiodarone was resumed yesterday. Was on Sinuse for a while and again is in Afib. -Per cardiology -On Eliquis 2.5 mg BID -Cardiac monitoring  4. HLD: on statin 5. Hx of HTN: Has had soft BP here. On Midodrine 10 mg PO TID Subjective:   Patient was seen and evaluated at bedside this morning rounds. He states that he is doing much better and his shortness of breath improved. No complaint.   Objective:   BP 106/74 (BP Location: Right Arm)   Pulse 93   Temp (!) 97.5 F (36.4 C) (Oral)   Resp 18   Ht 5' 10"  (1.778 m)   Wt 111.3 kg   SpO2 99%   BMI 35.20 kg/m   Intake/Output Summary (Last 24 hours) at 01/27/2021 0800 Last data filed at 01/27/2021 0600 Gross per 24 hour  Intake 994.58 ml  Output 2800 ml  Net -1805.42 ml   Weight change: -2.041 kg  Physical Exam:  Gen: Appears comfortable. In no acute distress CVS: Irregular rhythm, JVP improved. Resp: Has bibasilar crackles. Improved Abd: Soft, non tender to palpation Ext: Warm. No LEE  Imaging: DG CHEST PORT 1 VIEW  Result Date: 01/26/2021 CLINICAL DATA:  Dyspnea EXAM: PORTABLE CHEST 1 VIEW COMPARISON:  01/23/2021 FINDINGS: Cardiac shadow remains enlarged. Aortic calcifications are again noted. Vascular congestion is again seen slightly increased from the prior exam with mild interstitial edema. No sizable effusion is noted. No acute bony abnormality is seen. IMPRESSION: Increased vascular congestion with mild  interstitial edema. Electronically Signed   By: Inez Catalina M.D.   On: 01/26/2021 11:06    Labs: BMET Recent Labs  Lab 01/22/21 0150 01/23/21 0332 01/23/21 0943 01/24/21 0218 01/25/21 0402 01/26/21 0235 01/27/21 0532  NA 136 135 133* 136 136 136 138  K 3.8 4.7 5.0 4.4 4.4 4.4 3.9  CL 105 103 102 106 108 106 107  CO2 23 22 22 23  20* 21* 23  GLUCOSE 139* 111* 227* 122* 153* 154* 152*  BUN 58* 72* 71* 79* 75* 74* 71*  CREATININE 2.75* 3.30* 3.33* 3.11* 2.92* 2.88* 2.75*  CALCIUM 8.0* 8.1* 8.2* 7.9* 8.1* 8.4* 8.4*  PHOS  --   --   --  4.5  --  3.8 4.3   CBC Recent Labs  Lab 01/20/21 1731 01/21/21 0234 01/24/21 0218 01/27/21 0532  WBC 13.1* 11.2* 11.3* 9.4  NEUTROABS 9.8* 8.4* 8.4* 6.7  HGB 10.3* 9.6* 8.8* 9.7*  HCT 33.8* 31.1* 28.2* 31.6*  MCV 90.6 90.4 89.0 89.3  PLT 328 295 303 318    Medications:    . apixaban  2.5 mg Oral BID  . atorvastatin  40 mg Oral q1800  . insulin aspart  0-5 Units Subcutaneous QHS  . insulin aspart  0-9 Units Subcutaneous TID WC  . midodrine  10 mg Oral TID WC  . sodium chloride flush  3 mL Intravenous Q12H    Signed: Masoudi, Dorthula Rue, MD 01/27/2021, 8:00 AM   I have seen and examined this patient and agree with the plan of care. Note amended.  Dwana Melena, MD 01/27/2021, 2:47 PM

## 2021-01-27 NOTE — Progress Notes (Signed)
PROGRESS NOTE    [name] [name]  QJF:[id number] DOB: March 22, 1933 DOA: 01/20/2021 PCP: [name], MD   Brief Narrative 87-year with history of CAD, paroxysmal A. fib on Eliquis, CHF EF 40-45%, CKD stage IV, hypothyroidism, HTN, HLD, status post right upper quadrant ileostomy presented with worsening shortness of breath from ALF admitted for CHF exacerbation.  Cardiology team was consulted and started on IV diuretics.  Started on milrinone drip on 4/10 but did not tolerate this well.  Had frequent episodes of atrial fibrillation with RVR therefore started on amiodarone drip.  Assessment & Plan:   Principal Problem:   Chronic combined systolic and diastolic CHF (congestive heart failure) ([name]) Active Problems:   CAD (coronary artery disease)   Diabetes mellitus with diabetic nephropathy without long-term current use of insulin ([name])   Acute renal failure superimposed on stage 3 chronic kidney disease ([name])   GERD (gastroesophageal reflux disease)   HTN (hypertension)   Paroxysmal atrial fibrillation ([name])   Acute congestive heart failure with reduced ejection fraction, class III.  EF 20-25% CAD s/p PCI DES 2015 -Repeated 2D echo done on 01/22/2021 revealed LVEF 20 to 25%, left ventricle demonstrate global hypokinesis.  Moderate mitral valve regurgitation. -Did not tolerate milrinone well due to arrhythmia.  Not a candidate for beta-blocker and ARB/ACE inhibitor -Cardiology team following -Daily assessment for ongoing diuresis in light of renal function  Paroxysmal A. fib with RVR -Continue Eliquis. Amiodarine 251m bid PO, will attempt to dc his drip.   Prolonged QTC -We will repeat EKG.  Hold off on any offending agents  AKI on CKD 4 suspect cardiorenal Baseline creatinine 2.7.  Morning creatinine 2.75.  Closely monitor -Nephrology team following.  Elevated troponin, suspect demand ischemia -From demand ischemia  Leukocytosis suspect reactive in the setting of acute  illness -Resolved  Hyperlipidemia -Daily Lipitor  Type 2 diabetes with hyperglycemia Hemoglobin A1c 6.6 on 01/21/2021 -Sliding scale and Accu-Chek  GERD -Holding off on PPI due to prolonged QTC  Goals of care Palliative care team consulted to help establish goals of care in the setting of fluid overload, worsening renal function and other comorbidities  PT= no follow up needed.   DVT prophylaxis: Eliquis Code Status: DNR Family Communication:  Called AVicente Males Status is: Inpatient  Remains inpatient appropriate because:Inpatient level of care appropriate due to severity of illness. Awaiting renal function and vol levels to stabilize. On going palliative discussions.    Dispo:  Patient From: Assisted Living Facility  Planned Disposition: ALF  Medically stable for discharge: No         Subjective: Feels little better in terms of breathing.   Review of Systems Otherwise negative except as per HPI, including: General: Denies fever, chills, night sweats or unintended weight loss. Resp: Denies cough, wheezing, shortness of breath. Cardiac: Denies chest pain, palpitations, orthopnea, paroxysmal nocturnal dyspnea. GI: Denies abdominal pain, nausea, vomiting, diarrhea or constipation GU: Denies dysuria, frequency, hesitancy or incontinence MS: Denies muscle aches, joint pain or swelling Neuro: Denies headache, neurologic deficits (focal weakness, numbness, tingling), abnormal gait Psych: Denies anxiety, depression, SI/HI/AVH Skin: Denies new rashes or lesions ID: Denies sick contacts, exotic exposures, travel  Examination:  General exam: Appears calm and comfortable  Respiratory system: Irregularly irregular. bibasilar crackles.  Cardiovascular system: S1 & S2 heard, RRR. No JVD, murmurs, rubs, gallops or clicks.2+ b/l LE pitting edema.  Gastrointestinal system: Abdomen is nondistended, soft and nontender. No organomegaly or masses felt. Normal bowel sounds  heard. Central nervous system:  Alert and oriented. No focal neurological deficits. Extremities: Symmetric 5 x 5 power. Skin: No rashes, lesions or ulcers Psychiatry: Judgement and insight appear normal. Mood & affect appropriate.     Objective: Vitals:   01/26/21 1210 01/26/21 2051 01/27/21 0000 01/27/21 0350  BP: (!) 140/94 114/90 108/67 106/74  Pulse: (!) 113 100 (!) 103 93  Resp:  16  18  Temp: 97.8 F (36.6 C) (!) 97.5 F (36.4 C) 98 F (36.7 C) (!) 97.5 F (36.4 C)  TempSrc: Oral Oral Oral Oral  SpO2: 96% 98%  99%  Weight:    111.3 kg  Height:        Intake/Output Summary (Last 24 hours) at 01/27/2021 0805 Last data filed at 01/27/2021 0600 Gross per 24 hour  Intake 994.58 ml  Output 2800 ml  Net -1805.42 ml   Filed Weights   01/25/21 0427 01/26/21 0355 01/27/21 0350  Weight: 112.8 kg 113.3 kg 111.3 kg     Data Reviewed:   CBC: Recent Labs  Lab 01/20/21 1731 01/21/21 0234 01/24/21 0218 01/27/21 0532  WBC 13.1* 11.2* 11.3* 9.4  NEUTROABS 9.8* 8.4* 8.4* 6.7  HGB 10.3* 9.6* 8.8* 9.7*  HCT 33.8* 31.1* 28.2* 31.6*  MCV 90.6 90.4 89.0 89.3  PLT 328 295 303 219   Basic Metabolic Panel: Recent Labs  Lab 01/23/21 0943 01/24/21 0218 01/25/21 0402 01/26/21 0235 01/26/21 1646 01/27/21 0532  NA 133* 136 136 136  --  138  K 5.0 4.4 4.4 4.4  --  3.9  CL 102 106 108 106  --  107  CO2 22 23 20* 21*  --  23  GLUCOSE 227* 122* 153* 154*  --  152*  BUN 71* 79* 75* 74*  --  71*  CREATININE 3.33* 3.11* 2.92* 2.88*  --  2.75*  CALCIUM 8.2* 7.9* 8.1* 8.4*  --  8.4*  MG  --  1.9  --   --  2.0 1.9  PHOS  --  4.5  --  3.8  --  4.3   GFR: Estimated Creatinine Clearance: 24.5 mL/min (A) (by C-G formula based on SCr of 2.75 mg/dL (H)). Liver Function Tests: Recent Labs  Lab 01/20/21 1731 01/26/21 0235  AST 24  --   ALT 16  --   ALKPHOS 155*  --   BILITOT 0.4  --   PROT 6.9  --   ALBUMIN 2.7* 2.7*   No results for input(s): LIPASE, AMYLASE in the last 168  hours. No results for input(s): AMMONIA in the last 168 hours. Coagulation Profile: No results for input(s): INR, PROTIME in the last 168 hours. Cardiac Enzymes: No results for input(s): CKTOTAL, CKMB, CKMBINDEX, TROPONINI in the last 168 hours. BNP (last 3 results) No results for input(s): PROBNP in the last 8760 hours. HbA1C: No results for input(s): HGBA1C in the last 72 hours. CBG: Recent Labs  Lab 01/26/21 0611 01/26/21 1114 01/26/21 1608 01/26/21 2129 01/27/21 0557  GLUCAP 149* 197* 138* 167* 150*   Lipid Profile: No results for input(s): CHOL, HDL, LDLCALC, TRIG, CHOLHDL, LDLDIRECT in the last 72 hours. Thyroid Function Tests: No results for input(s): TSH, T4TOTAL, FREET4, T3FREE, THYROIDAB in the last 72 hours. Anemia Panel: No results for input(s): VITAMINB12, FOLATE, FERRITIN, TIBC, IRON, RETICCTPCT in the last 72 hours. Sepsis Labs: Recent Labs  Lab 01/25/21 1016 01/26/21 0235  LATICACIDVEN 2.2* 1.4    Recent Results (from the past 240 hour(s))  SARS CORONAVIRUS 2 (TAT 6-24 HRS) Nasopharyngeal Nasopharyngeal Swab  Status: None   Collection Time: 01/20/21  4:35 PM   Specimen: Nasopharyngeal Swab  Result Value Ref Range Status   SARS Coronavirus 2 NEGATIVE NEGATIVE Final    Comment: (NOTE) SARS-CoV-2 target nucleic acids are NOT DETECTED.  The SARS-CoV-2 RNA is generally detectable in upper and lower respiratory specimens during the acute phase of infection. Negative results do not preclude SARS-CoV-2 infection, do not rule out co-infections with other pathogens, and should not be used as the sole basis for treatment or other patient management decisions. Negative results must be combined with clinical observations, patient history, and epidemiological information. The expected result is Negative.  Fact Sheet for Patients: SugarRoll.be  Fact Sheet for Healthcare  Providers: https://www.woods-mathews.com/  This test is not yet approved or cleared by the [place] FDA and  has been authorized for detection and/or diagnosis of SARS-CoV-2 by FDA under an Emergency Use Authorization (EUA). This EUA will remain  in effect (meaning this test can be used) for the duration of the COVID-19 declaration under Se ction 564(b)(1) of the Act, 21 U.S.C. section 360bbb-3(b)(1), unless the authorization is terminated or revoked sooner.  Performed at [facility], [place] [address], [place], [place] [address]   SARS CORONAVIRUS 2 (TAT 6-24 HRS) Nasopharyngeal Nasopharyngeal Swab     Status: None   Collection Time: 01/23/21  4:42 AM   Specimen: Nasopharyngeal Swab  Result Value Ref Range Status   SARS Coronavirus 2 NEGATIVE NEGATIVE Final    Comment: (NOTE) SARS-CoV-2 target nucleic acids are NOT DETECTED.  The SARS-CoV-2 RNA is generally detectable in upper and lower respiratory specimens during the acute phase of infection. Negative results do not preclude SARS-CoV-2 infection, do not rule out co-infections with other pathogens, and should not be used as the sole basis for treatment or other patient management decisions. Negative results must be combined with clinical observations, patient history, and epidemiological information. The expected result is Negative.  Fact Sheet for Patients: SugarRoll.be  Fact Sheet for Healthcare Providers: https://www.woods-mathews.com/  This test is not yet approved or cleared by the [place] FDA and  has been authorized for detection and/or diagnosis of SARS-CoV-2 by FDA under an Emergency Use Authorization (EUA). This EUA will remain  in effect (meaning this test can be used) for the duration of the COVID-19 declaration under Se ction 564(b)(1) of the Act, 21 U.S.C. section 360bbb-3(b)(1), unless the authorization is terminated or revoked  sooner.  Performed at [facility], [place] [address] [place], [place] [address]          Radiology Studies: DG CHEST PORT 1 VIEW  Result Date: 01/26/2021 CLINICAL DATA:  Dyspnea EXAM: PORTABLE CHEST 1 VIEW COMPARISON:  01/23/2021 FINDINGS: Cardiac shadow remains enlarged. Aortic calcifications are again noted. Vascular congestion is again seen slightly increased from the prior exam with mild interstitial edema. No sizable effusion is noted. No acute bony abnormality is seen. IMPRESSION: Increased vascular congestion with mild interstitial edema. Electronically Signed   By: Inez Catalina M.D.   On: 01/26/2021 11:06        Scheduled Meds: . apixaban  2.5 mg Oral BID  . atorvastatin  40 mg Oral q1800  . insulin aspart  0-5 Units Subcutaneous QHS  . insulin aspart  0-9 Units Subcutaneous TID WC  . midodrine  10 mg Oral TID WC  . sodium chloride flush  3 mL Intravenous Q12H   Continuous Infusions: . sodium chloride    . amiodarone 30 mg/hr (01/26/21  2212)  . furosemide Stopped (01/26/21 1929)     LOS: 7 days   Time spent= 35 mins    Steele Ledonne Arsenio Loader, MD [facility]  If 7PM-7AM, please contact night-coverage  01/27/2021, 8:05 AM

## 2021-01-27 NOTE — Progress Notes (Addendum)
Progress Note  Patient Name: [name] [name] Date of Encounter: 01/27/2021  [facility] Cardiologist: [name], MD   Subjective   No acute overnight events. Somnolent this morning - will wake up to answer questions but falls asleep quickly. He notes his breathing is a little better today. However, he states he had episodes were it was difficult to breathe last night. He did respond well to IV Lasix yesterday. No chest pain or palpitations.   Refused to be on telemetry this morning and this was removed.  Inpatient Medications    Scheduled Meds: . apixaban  2.5 mg Oral BID  . atorvastatin  40 mg Oral q1800  . insulin aspart  0-5 Units Subcutaneous QHS  . insulin aspart  0-9 Units Subcutaneous TID WC  . midodrine  10 mg Oral TID WC  . sodium chloride flush  3 mL Intravenous Q12H   Continuous Infusions: . sodium chloride    . amiodarone 30 mg/hr (01/26/21 2212)  . furosemide 120 mg (01/27/21 0932)   PRN Meds: sodium chloride, acetaminophen, camphor-menthol, dextromethorphan-guaiFENesin, ondansetron (ZOFRAN) IV, senna-docusate, sodium chloride flush   Vital Signs    Vitals:   01/26/21 1210 01/26/21 2051 01/27/21 0000 01/27/21 0350  BP: (!) 140/94 114/90 108/67 106/74  Pulse: (!) 113 100 (!) 103 93  Resp:  16  18  Temp: 97.8 F (36.6 C) (!) 97.5 F (36.4 C) 98 F (36.7 C) (!) 97.5 F (36.4 C)  TempSrc: Oral Oral Oral Oral  SpO2: 96% 98%  99%  Weight:    111.3 kg  Height:        Intake/Output Summary (Last 24 hours) at 01/27/2021 0946 Last data filed at 01/27/2021 0600 Gross per 24 hour  Intake 574.58 ml  Output 2800 ml  Net -2225.42 ml   Last 3 Weights 01/27/2021 01/26/2021 01/25/2021  Weight (lbs) 245 lb 4.8 oz 249 lb 12.8 oz 248 lb 9.6 oz  Weight (kg) 111.267 kg 113.309 kg 112.764 kg      Telemetry    Patient refused telemetry this morning but before it was removed he was in atrial fibrillation with rates mostly in the 100's to 110's but as high as 140's  at times. - Personally Reviewed  ECG    No new ECG tracing today. - Personally Reviewed  Physical Exam   GEN: Obese Caucasian male in no acute distress.   Neck: Unable to assess JVD well due to patient's position. Cardiac: Borderline tachycardic with irregular rhythm. No murmurs, rubs, or gallops.  Respiratory: No increased work of breathing. Crackles in bilateral bases. GI: Soft, non-distended, and non-tender. MS: Mild lower extremity edema bilaterally. No deformity. Neuro:  No focal deficits. Psych: Somnolent but normal affect.  Labs    High Sensitivity Troponin:   Recent Labs  Lab 01/20/21 1731 01/20/21 1926 01/23/21 0332  TROPONINIHS 20* 19* 22*      Chemistry Recent Labs  Lab 01/20/21 1731 01/21/21 0234 01/25/21 0402 01/26/21 0235 01/27/21 0532  NA 135   < > 136 136 138  K 4.4   < > 4.4 4.4 3.9  CL 106   < > 108 106 107  CO2 20*   < > 20* 21* 23  GLUCOSE 96   < > 153* 154* 152*  BUN 60*   < > 75* 74* 71*  CREATININE 2.94*   < > 2.92* 2.88* 2.75*  CALCIUM 8.2*   < > 8.1* 8.4* 8.4*  PROT 6.9  --   --   --   --  ALBUMIN 2.7*  --   --  2.7*  --   AST 24  --   --   --   --   ALT 16  --   --   --   --   ALKPHOS 155*  --   --   --   --   BILITOT 0.4  --   --   --   --   GFRNONAA 20*   < > 20* 21* 22*  ANIONGAP 9   < > 8 9 8    < > = values in this interval not displayed.     Hematology Recent Labs  Lab 01/21/21 0234 01/24/21 0218 01/27/21 0532  WBC 11.2* 11.3* 9.4  RBC 3.44* 3.17* 3.54*  HGB 9.6* 8.8* 9.7*  HCT 31.1* 28.2* 31.6*  MCV 90.4 89.0 89.3  MCH 27.9 27.8 27.4  MCHC 30.9 31.2 30.7  RDW 14.6 14.7 14.6  PLT 295 303 318    BNP Recent Labs  Lab 01/20/21 1731 01/27/21 0532  BNP 705.9* 1,614.4*     DDimer No results for input(s): DDIMER in the last 168 hours.   Radiology    DG CHEST PORT 1 VIEW  Result Date: 01/26/2021 CLINICAL DATA:  Dyspnea EXAM: PORTABLE CHEST 1 VIEW COMPARISON:  01/23/2021 FINDINGS: Cardiac shadow remains  enlarged. Aortic calcifications are again noted. Vascular congestion is again seen slightly increased from the prior exam with mild interstitial edema. No sizable effusion is noted. No acute bony abnormality is seen. IMPRESSION: Increased vascular congestion with mild interstitial edema. Electronically Signed   By: Inez Catalina M.D.   On: 01/26/2021 11:06    Cardiac Studies   Echocardiogram 01/22/2021: Impressions: 1. Left ventricular ejection fraction, by estimation, is 20 to 25%. The  left ventricle has severely decreased function. The left ventricle  demonstrates global hypokinesis. There is mild left ventricular  hypertrophy. Left ventricular diastolic parameters  are indeterminate.  2. Right ventricular systolic function is moderately reduced. The right  ventricular size is mildly enlarged.  3. The mitral valve is normal in structure. Moderate mitral valve  regurgitation.  4. The aortic valve is tricuspid. Aortic valve regurgitation is trivial.  Mild to moderate aortic valve sclerosis/calcification is present, without  any evidence of aortic stenosis.  Patient Profile     87 y.o. male with a history of CAD s/p DES to [place] in 2015 and found to CTO of LAD at that time as well, chronic combined CHF with EF of 20-25% on Echo this admission (previously 40-45%), paroxysmal atrial fibrillation/flutter on Eliquis, hypertension, hyperlipidemia, hypothyroidism, CKD stage IV, and s/p right upper quadrant ileostomy who we are seeing for acute on chronic CHF and atrial fibrillation.  Assessment & Plan    Acute on Chronic Combined CHF - BNP initially 705.9 on admission and up to 1,614 today. - Repeat chest x-ray yesterday showed increased vascular congestion with mild interstitial edema.  - Echo showed LVEF of 20-25% with global hypokinesis. RV mildly enlarged with moderate systolic function. Moderate MR also noted.  - Initially diuresed with IV Lasix but this was stopped due to worsening kidney  function. However, Lasix was restarted yesterday due to worsening dyspnea and hypervolemia. He was started on empiric milrinone on 01/24/2021 but felt worse with this and has refused further milrinone. Nephrology now following. - Currently on IV Lasix 151m twice daily. Documented urinary output of 2.8 L and net negative 4.09 since admission. Weight 245 lbs today, down 4lbs from yesterday and 6 lbs  from admission. Creatinine slowly improving.  - Continue IV Lasix per Nephrology.  - No ACEi/ARB/ARNI or MRA given renal function.  - No beta-blocker given soft BP and decompensated CHF. - Continue to monitor daily weight, strict I/O's, and renal function. - Prognosis is poor given age, severe heart failure, and CKD stage IV. Dr. Gardiner Rhyme had discussion with patient and sister yesterday - patient has declined any procedures, including RHC, and would like to focus on comfort measures. Palliative Care has been consulted.  Atrial Fibrillation with RVR - Rates mostly in the low 100's to 110's but as high as the 140's at times. Felt to be secondary to acute CHF. - Will switch from IV Amiodarone to PO Amiodarone due to difficult with IV. Will start PO Amiodarone 289m twice daily. - Continue chronic anticoagulation with Eliquis 2.578mtwice daily (reduced dose given age and renal function).  - No plans for DCCV. Options for rate control limited - Digoxin not an option given renal function.  CAD Elevated Troponin - S/p DES to [place] in 2015. Also found to have CTO fo LAD at that time.  - High-sensitivity troponin minimally elevated and flat at 20 >> 19 >>22. - Not consistent with ACS. Likely secondary to acute CHF and atrial fibrillation. No plans for any ischemic evaluation.  Hypotension - BP stable.  - Received IV Albumin on 4.9. - Continue Midodrine 1072mhree times daily.   Hyperlipidemia - Continue Lipitor 34m33mily.  CKD Stage IV - Creatinine peaked at 3.33 on 01/23/2021 but has been slowly improving  the last several days. 2.75 today.  - Continue to avoid nephrotoxic agents. - Nephrology following.  Otherwise, per primary team: - Type 2 diabetes - GERD - Hyperkalemia: improved with Lokelma - Anxiety - Leukocytosis    For questions or updates, please contact [facility] consult [email or web address] for contact info under   Signed, CallDarreld Mclean-C  01/27/2021, 9:46 AM     Patient seen and examined.  Agree with above documentation.  On exam, patient is alert and oriented, irregular, tachycardic, no murmurs, bibasilar crackles, + JVD.  Net negative 1.8 L yesterday on IV Lasix 120 mg twice daily.  Creatinine mildly improved, 2.88 > 2.75.  BP stable, 117/81 this morning.  Symptomatically improved.  Recommend continuing Lasix 120 mg twice daily.  He is refusing telemetry but now agreeable.  Switch from IV to PO amio.  Palliative care consulted.  ChriDonato Heinz

## 2021-01-27 NOTE — Progress Notes (Signed)
Pt refused tel this AM, monitor removed, Charge nurse ,triad ,and central tele notified

## 2021-01-27 NOTE — Plan of Care (Signed)

## 2021-01-27 NOTE — Plan of Care (Signed)

## 2021-01-27 NOTE — Progress Notes (Signed)
PMT consult received.  Chart reviewed.  Discussed with Azucena Freed.  Patient makes his own decisions but [name] and her sister would like to be involved and help guide him.  [name] will be at the hospital at approximately 10:00 am on Thursday morning for a meeting with PMT.  Florentina Jenny, PA-C Palliative Medicine Office:  [phone]

## 2021-01-28 ENCOUNTER — Other Ambulatory Visit (HOSPITAL_COMMUNITY): Payer: Self-pay

## 2021-01-28 DIAGNOSIS — N184 Chronic kidney disease, stage 4 (severe): Secondary | ICD-10-CM

## 2021-01-28 DIAGNOSIS — R0602 Shortness of breath: Secondary | ICD-10-CM | POA: Diagnosis not present

## 2021-01-28 DIAGNOSIS — I5043 Acute on chronic combined systolic (congestive) and diastolic (congestive) heart failure: Secondary | ICD-10-CM | POA: Diagnosis not present

## 2021-01-28 DIAGNOSIS — N179 Acute kidney failure, unspecified: Secondary | ICD-10-CM

## 2021-01-28 DIAGNOSIS — Z515 Encounter for palliative care: Secondary | ICD-10-CM | POA: Diagnosis not present

## 2021-01-28 DIAGNOSIS — I4891 Unspecified atrial fibrillation: Secondary | ICD-10-CM | POA: Diagnosis not present

## 2021-01-28 DIAGNOSIS — I2510 Atherosclerotic heart disease of native coronary artery without angina pectoris: Secondary | ICD-10-CM | POA: Diagnosis not present

## 2021-01-28 DIAGNOSIS — I5042 Chronic combined systolic (congestive) and diastolic (congestive) heart failure: Secondary | ICD-10-CM | POA: Diagnosis not present

## 2021-01-28 LAB — CBC
HCT: 34 % — ABNORMAL LOW (ref 39.0–52.0)
Hemoglobin: 10.4 g/dL — ABNORMAL LOW (ref 13.0–17.0)
MCH: 26.5 pg (ref 26.0–34.0)
MCHC: 30.6 g/dL (ref 30.0–36.0)
MCV: 86.7 fL (ref 80.0–100.0)
Platelets: 325 10*3/uL (ref 150–400)
RBC: 3.92 MIL/uL — ABNORMAL LOW (ref 4.22–5.81)
RDW: 14.5 % (ref 11.5–15.5)
WBC: 10.9 10*3/uL — ABNORMAL HIGH (ref 4.0–10.5)
nRBC: 0 % (ref 0.0–0.2)

## 2021-01-28 LAB — BASIC METABOLIC PANEL
Anion gap: 12 (ref 5–15)
BUN: 69 mg/dL — ABNORMAL HIGH (ref 8–23)
CO2: 24 mmol/L (ref 22–32)
Calcium: 8.6 mg/dL — ABNORMAL LOW (ref 8.9–10.3)
Chloride: 102 mmol/L (ref 98–111)
Creatinine, Ser: 2.87 mg/dL — ABNORMAL HIGH (ref 0.61–1.24)
GFR, Estimated: 21 mL/min — ABNORMAL LOW (ref >60)
Glucose, Bld: 166 mg/dL — ABNORMAL HIGH (ref 70–99)
Potassium: 3.5 mmol/L (ref 3.5–5.1)
Sodium: 138 mmol/L (ref 135–145)

## 2021-01-28 LAB — RESP PANEL BY RT-PCR (FLU A&B, COVID) ARPGX2
Influenza A by PCR: NEGATIVE
Influenza B by PCR: NEGATIVE
SARS Coronavirus 2 by RT PCR: NEGATIVE

## 2021-01-28 LAB — MAGNESIUM: Magnesium: 1.8 mg/dL (ref 1.7–2.4)

## 2021-01-28 LAB — GLUCOSE, CAPILLARY
Glucose-Capillary: 154 mg/dL — ABNORMAL HIGH (ref 70–99)
Glucose-Capillary: 217 mg/dL — ABNORMAL HIGH (ref 70–99)
Glucose-Capillary: 114 mg/dL — ABNORMAL HIGH (ref 70–99)
Glucose-Capillary: 251 mg/dL — ABNORMAL HIGH (ref 70–99)

## 2021-01-28 MED ORDER — TORSEMIDE 20 MG PO TABS
60.0000 mg | ORAL_TABLET | Freq: Two times a day (BID) | ORAL | 0 refills | Status: AC
  Filled 2021-01-28: qty 180, 30d supply, fill #0

## 2021-01-28 MED ORDER — ALPRAZOLAM 0.5 MG PO TABS
0.5000 mg | ORAL_TABLET | Freq: Every day | ORAL | Status: DC
  Administered 2021-01-28: 0.5 mg via ORAL
  Filled 2021-01-28: qty 1

## 2021-01-28 MED ORDER — POTASSIUM CHLORIDE CRYS ER 20 MEQ PO TBCR
40.0000 meq | EXTENDED_RELEASE_TABLET | Freq: Once | ORAL | Status: AC
  Administered 2021-01-28: 40 meq via ORAL
  Filled 2021-01-28: qty 2

## 2021-01-28 MED ORDER — POTASSIUM CHLORIDE CRYS ER 20 MEQ PO TBCR
20.0000 meq | EXTENDED_RELEASE_TABLET | Freq: Two times a day (BID) | ORAL | 0 refills | Status: AC
  Filled 2021-01-28: qty 60, 30d supply, fill #0

## 2021-01-28 MED ORDER — HYDROMORPHONE HCL 2 MG PO TABS
1.0000 mg | ORAL_TABLET | ORAL | Status: DC | PRN
Start: 2021-01-28 — End: 2021-01-29

## 2021-01-28 NOTE — Consult Note (Signed)
   [facility] Inpatient Consult   01/28/2021  WATT GEILER Feb 18, 1933 [id number]   [place] [facility] [ACO] Patient: Medicare CMS DCE  Chart reviewed and with morning progression meeting reveals the patient is currently transitioning to [place].   Plan: Patient will have full case management services through Hospice and needs will be met at the hospice level of care.  Natividad Brood, RN BSN [facility] Liaison  [phone] business mobile phone Toll free office [phone]  Fax number: [phone] [email or web address]

## 2021-01-28 NOTE — TOC Progression Note (Signed)
Transition of Care [facility]) - Progression Note    Patient Details  Name: [name] [name] MRN: [id number] Date of Birth: 30-Dec-1932  Transition of [facility]) CM/SW Contact  Reece Agar, [place] Phone Number: 01/28/2021, 3:18 PM  Clinical Narrative:     3:00pm- Pt sister [name]) voiced concerns about pt arriving to facility too late, she expressed that she knows [facility]AR could be late and does not want to chance it.CSW will dc in the morning and follow up with pt sister. CSW requested covid for pt and let [facility] know he will be transported tomorrow.   Expected Discharge Plan: Assisted Living Barriers to Discharge: Continued Medical Work up  Expected Discharge Plan and Services Expected Discharge Plan: Assisted Living In-house Referral: Clinical Social Work Discharge Planning Services: Other - See comment (CSW) Post Acute Care Choice:  (ALF) Living arrangements for the past 2 months: [place] Expected Discharge Date: 01/28/21                                     Social Determinants of Health (SDOH) Interventions    Readmission Risk Interventions No flowsheet data found.

## 2021-01-28 NOTE — Progress Notes (Signed)
Heart Failure Navigator Progress Note  Assessed for Heart & Vascular TOC clinic readiness.  Unfortunately at this time the patient does not meet criteria due to discharging today with home hospice.   Navigator available for reassessment of patient.   Pricilla Holm, RN, BSN Heart Failure Nurse Navigator [phone]

## 2021-01-28 NOTE — Progress Notes (Addendum)
Progress Note  Patient Name: [name] [name] Date of Encounter: 01/28/2021  [facility] Cardiologist: [name], MD   Subjective   Reports dyspnea improved  Inpatient Medications    Scheduled Meds: . amiodarone  200 mg Oral BID  . apixaban  2.5 mg Oral BID  . atorvastatin  40 mg Oral q1800  . insulin aspart  0-5 Units Subcutaneous QHS  . insulin aspart  0-9 Units Subcutaneous TID WC  . midodrine  10 mg Oral TID WC  . sodium chloride flush  3 mL Intravenous Q12H  . traZODone  50 mg Oral QHS   Continuous Infusions: . sodium chloride    . furosemide 120 mg (01/28/21 0938)   PRN Meds: sodium chloride, acetaminophen, ALPRAZolam, camphor-menthol, dextromethorphan-guaiFENesin, ondansetron (ZOFRAN) IV, senna-docusate, sodium chloride flush   Vital Signs    Vitals:   01/27/21 2337 01/28/21 0326 01/28/21 0336 01/28/21 0806  BP: 105/82  (!) 98/57 116/87  Pulse: 73  80 (!) 103  Resp: 14  16 18   Temp: (!) 97.5 F (36.4 C)  98.2 F (36.8 C) 97.8 F (36.6 C)  TempSrc: Oral  Oral Oral  SpO2: 98%  100% 98%  Weight:  109.5 kg    Height:        Intake/Output Summary (Last 24 hours) at 01/28/2021 1019 Last data filed at 01/28/2021 0832 Gross per 24 hour  Intake 1059.53 ml  Output 2250 ml  Net -1190.47 ml   Last 3 Weights 01/28/2021 01/27/2021 01/26/2021  Weight (lbs) 241 lb 6.4 oz 245 lb 4.8 oz 249 lb 12.8 oz  Weight (kg) 109.498 kg 111.267 kg 113.309 kg      Telemetry    AF with rate 100-120s - Personally Reviewed  ECG    No new ECG- Personally Reviewed  Physical Exam   GEN: No acute distress.   Neck: + JVD Cardiac: irregular, tachycardic, no murmurs, rubs, or gallops.  Respiratory: Bibasilar crackles GI: Soft, nontender, non-distended  MS: Trace edema Neuro:  Nonfocal  Psych: Normal affect   Labs    High Sensitivity Troponin:   Recent Labs  Lab 01/20/21 1731 01/20/21 1926 01/23/21 0332  TROPONINIHS 20* 19* 22*      Chemistry Recent Labs  Lab  01/26/21 0235 01/27/21 0532 01/28/21 0405  NA 136 138 138  K 4.4 3.9 3.5  CL 106 107 102  CO2 21* 23 24  GLUCOSE 154* 152* 166*  BUN 74* 71* 69*  CREATININE 2.88* 2.75* 2.87*  CALCIUM 8.4* 8.4* 8.6*  ALBUMIN 2.7*  --   --   GFRNONAA 21* 22* 21*  ANIONGAP 9 8 12      Hematology Recent Labs  Lab 01/24/21 0218 01/27/21 0532 01/28/21 0405  WBC 11.3* 9.4 10.9*  RBC 3.17* 3.54* 3.92*  HGB 8.8* 9.7* 10.4*  HCT 28.2* 31.6* 34.0*  MCV 89.0 89.3 86.7  MCH 27.8 27.4 26.5  MCHC 31.2 30.7 30.6  RDW 14.7 14.6 14.5  PLT 303 318 325    BNP Recent Labs  Lab 01/27/21 0532  BNP 1,614.4*     DDimer No results for input(s): DDIMER in the last 168 hours.   Radiology    DG CHEST PORT 1 VIEW  Result Date: 01/26/2021 CLINICAL DATA:  Dyspnea EXAM: PORTABLE CHEST 1 VIEW COMPARISON:  01/23/2021 FINDINGS: Cardiac shadow remains enlarged. Aortic calcifications are again noted. Vascular congestion is again seen slightly increased from the prior exam with mild interstitial edema. No sizable effusion is noted. No acute bony abnormality  is seen. IMPRESSION: Increased vascular congestion with mild interstitial edema. Electronically Signed   By: Inez Catalina M.D.   On: 01/26/2021 11:06    Cardiac Studies   Echo 01/22/21: 1. Left ventricular ejection fraction, by estimation, is 20 to 25%. The  left ventricle has severely decreased function. The left ventricle  demonstrates global hypokinesis. There is mild left ventricular  hypertrophy. Left ventricular diastolic parameters  are indeterminate.  2. Right ventricular systolic function is moderately reduced. The right  ventricular size is mildly enlarged.  3. The mitral valve is normal in structure. Moderate mitral valve  regurgitation.  4. The aortic valve is tricuspid. Aortic valve regurgitation is trivial.  Mild to moderate aortic valve sclerosis/calcification is present, without  any evidence of aortic stenosis.   Patient Profile      87 y.o. male with a hx of CAD status post DES to the RI and CTO of the LAD,paroxysmal atrial fibrillation/flutter, chronic systolic anddiastolic CHF, CKD IV, HLD and hypertensionwho is being seen for the evaluation ofCHF and AFib RVR. TTE wit EF 20-25%  With moderate MR   Assessment & Plan    Acute on chronic systolic heart failure, EF 20-25%: Admitted with volume overload and AKI.  BNP elevated.  Chest x-ray with pulmonary edema.  Echo with EF 20-25% (previously 40-45%. -Initially diuresed with IV Lasix, had worsening kidney function.  Lasix held.  Creatinine appears to be improving.   -Started on empiric milrinone on 4/10 but reports felt worse with this and refused further milrinone.   -Given his CKD stage IV and poor functional status, he is not a candidate for invasive work-up of his cardiomyopathy -Responding to IV lasix 120 mg BID and Cr stable.  Would continue.  However patient is adamant about going home today.  Palliative consulted, family meeting planned for this morning  Addendum: patient adamant about discharge today, plan for home with hospice.  Can switch to PO torsemide 60 mg BID   Atrial fibrillation with RVR : Has been in and out of A. fib.  Suspect this is being driven by his congestive heart failure.  -Amiodarone was stopped on admission.  He is going in and out of AF, restarted amio to try and maintain sinus rhythm -Currently not on AV nodal blockers. -No plans for procedures.  Continue Eliquis 2.5 mg twice daily.  This is an appropriate dose for his age and kidney function.  -Continue amiodarone 200 mg BID, can decrease to once daily on discharge   CAD : Cath in 2015 with CTO of the LAD.  Status post drug-eluting stent to the ramus.  -Troponins are flat negative.  No symptoms of angina.    Acute on chronic CKD: Now CKD stage IV.  Avoid nephrotoxic agents.   For questions or updates, please contact Center Please consult [email or web address] for contact info  under        Signed, Donato Heinz, MD  01/28/2021, 10:19 AM

## 2021-01-28 NOTE — Consult Note (Signed)
Consultation Note Date: 01/28/2021   Patient Name: [name] [name]  DOB: [birth year]  MRN: [id number]  Age / Sex: 87 y.o., male  PCP: [name], MD Referring Physician: Damita Lack, MD  Reason for Consultation: Establishing goals of care  HPI/Patient Profile: 87 y.o. male  with past medical history of systolic heart failure, ulcerative colitis s/p colectomy and colostomy, CKD 4, who was admitted on 01/20/2021 with cardiogenic shock.   He was found to have both diastolic and systolic HF as well as atrial fibrillation with RVR.  Patient was diuresed and developed worsening kidney failure.  MD cut back on the diuresis and the patient very quickly became SOB.  Echo shows a decreased EF of 20 - 25%, global hypokinesis and mitral valve regurgitation.  His BNP has been climbing rather than decreasing and his physical therapy evaluations are worsening despite medical treatment.  Clinical Assessment and Goals of Care:  I have reviewed medical records including EPIC notes, labs and imaging, received report from the care team, examined the patient and met at bedside with  to discuss diagnosis prognosis, GOC, EOL wishes, disposition and options.  I introduced Palliative Medicine as specialized medical care for people living with serious illness. It focuses on providing relief from the symptoms and stress of a serious illness.   We discussed a brief life review of the patient. He owned and operated a Conservation officer, historic buildings.  He worked hard his entire life.  His wife became ill with primary biliary cirrhosis.  He cared for her for over 16 years.  He currently lives at [facility] and likes it there very much.    As far as functional and nutritional status his is eating some and is able to walk short distances but quickly becomes short of breath and tachycardic  We discussed his current illness and what  it means in the larger context of his on-going co-morbidities.  Natural disease trajectory and expectations at EOL were discussed.  I attempted to elicit values and goals of care important to the patient.  [name] understands that his prognosis is limited.  His priority is to be comfortable.  Not short of breath or anxious.   It also is very important to him that he make his own decisions and be at home in his own environment.  The difference between aggressive medical intervention and comfort care was considered in light of the patient's goals of care.  A MOST form was completed and [name] made the following selections:   Cardiopulmonary Resuscitation: Do Not Attempt Resuscitation (DNR/No CPR)  Medical Interventions: Comfort Measures: Keep clean, warm, and dry. Use medication by any route, positioning, wound care, and other measures to relieve pain and suffering. Use oxygen, suction and manual treatment of airway obstruction as needed for comfort. Do not transfer to the hospital unless comfort needs cannot be met in current location.  Antibiotics: Determine use of limitation of antibiotics when infection occurs  IV Fluids: No IV fluids (provide other measures to ensure  comfort)  Feeding Tube: No feeding tube   Mr. [name] is very uncomfortable here in the hospital.  His HCPOA [name] wants to reserve the right for him to return to the hospital if he can not be kept comfortable at [facility].   Hospice and Palliative Care services outpatient were explained and offered.  Questions and concerns were addressed.  The family was encouraged to call with questions or concerns.    Primary Decision Maker:  PATIENT    SUMMARY OF RECOMMENDATIONS     DNR  MOST completed.   Return to [facility] with good regimen of medications to be comfortable including diuretics, anti-anxiety meds and medications for shortness of breath.  [facility] has been contacted to provide Hosp[facility] services at [facility]  Patient will need a new mattress thru Hospice at ALF.  Xanax for Anxiety  Dilaudid (given renal failure) for dyspnea  Trazodone and Xanax for insomnia  Fan on face for SOB.  Continue Senna Will need a strong diuretic regimen and appropriate potassium supplementation. Code Status/Advance Care Planning:  DNR   Symptom Management:   As above.  Additional Recommendations (Limitations, Scope, Preferences):  Full Comfort Care with hospice to follow at ALF.    Palliative Prophylaxis:   Frequent assessment for dyspnea.  Psycho-social/Spiritual:   Desire for further Chaplaincy support: not discussed.  Christian  Prognosis:  Weeks.    Discharge Planning: Home with Hospice      Primary Diagnoses: Present on Admission: . Acute renal failure superimposed on stage 3 chronic kidney disease ([place]) . Chronic combined systolic and diastolic CHF (congestive heart failure) ([place]) . CAD (coronary artery disease) . Diabetes mellitus with diabetic nephropathy without long-term current use of insulin ([place]) . GERD (gastroesophageal reflux disease) . Paroxysmal atrial fibrillation ([name]) . HTN (hypertension)   I have reviewed the medical record, interviewed the patient and family, and examined the patient. The following aspects are pertinent.  Past Medical History:  Diagnosis Date  . Allergic reaction caused by a drug 11/2017  . Anemia   . Atrial flutter ([place])    a. s/p RFCA of counterclockwise cavo-tricuspid isthmus dependent atrial flutter 2009 by Dr. Rayann Heman.  Marland Kitchen CAD (coronary artery disease) 06/2014   a. 06/2014: abnl nuc. Cath: Totally occluded LAD with faint collaterals (not a candidate for CTO PCI), 90% ramus s/p DES, 50-70% OM2.  . Chronic diastolic CHF (congestive heart failure) ([place])    a. 2D Echo 10/23/13: EF 50-55%, basal mid-inf HK, grade 2 DD, mild MR, mod dilated LA, no sig change from prior.  . CKD (chronic kidney disease), stage III ([place])   . Colostomy in place  [facility])   . Complication of anesthesia    " during my kidney stone surgery my heart went out of rhythm  . Compression fracture of L1 lumbar vertebra ([name])   . DCM (dilated cardiomyopathy) ([place]) 02/27/2018   EF 35-40% by TEE 02/2018 felt tachycardia mediated  . Diabetes ([place])    TYPE 2   . Dyslipidemia   . Dyspnea   . Frequent PVCs   . Gastric ulcer   . GERD (gastroesophageal reflux disease)   . History of blood transfusion   . History of kidney stones   . History of peptic ulcer disease   . HTN (hypertension)   . Hx of acute renal failure 01/02/10-3/24-11   due to hypovolemic shock, gastroenteritis and dehydration,hospitalized . Did requre a few days of dialysis. Cr at discharge was 1.8  . Kidney  disease   . Kidney stone may 2009   Right hydronephrosis, S/P stone removal  . Lower back pain   . Macular degeneration   . OA (osteoarthritis) of hip   . Obesity   . Osteopenia   . PAF (paroxysmal atrial fibrillation) ([name])    not on long term anticoagulation due to history of heme positive stools and anemia  . Shingles    episode  . Small bowel obstruction, partial ([place]) 2009   Episode  . Ulcerative colitis ([place])    a. s/p total colectomy remotely.   Social History   Socioeconomic History  . Marital status: Widowed    Spouse name: Not on file  . Number of children: Not on file  . Years of education: Not on file  . Highest education level: Not on file  Occupational History  . Not on file  Tobacco Use  . Smoking status: Never Smoker  . Smokeless tobacco: Never Used  Vaping Use  . Vaping Use: Never used  Substance and Sexual Activity  . Alcohol use: No  . Drug use: No  . Sexual activity: Not on file  Other Topics Concern  . Not on file  Social History Narrative  . Not on file   Social Determinants of Health   Financial Resource Strain: Not on file  Food Insecurity: Not on file  Transportation Needs: Not on file  Physical Activity: Not on file  Stress: Not on file   Social Connections: Not on file   Family History  Problem Relation Age of Onset  . Colon cancer Mother   . Esophageal cancer Neg Hx   . Stomach cancer Neg Hx   . Pancreatic cancer Neg Hx   . Liver disease Neg Hx     Allergies  Allergen Reactions  . Brilinta [Ticagrelor] Shortness Of Breath and Other (See Comments)    Changed to Plavix due to SOB  . Clindamycin/Lincomycin Anaphylaxis and Other (See Comments)    Whole body skin peeling, blisters also  . Doxycycline Anaphylaxis  . Penicillins Rash and Other (See Comments)    Has patient had a PCN reaction causing immediate rash, facial/tongue/throat swelling, SOB or lightheadedness with hypotension: Yes Has patient had a PCN reaction causing severe rash involving mucus membranes or skin necrosis: No Has patient had a PCN reaction that required hospitalization: No Has patient had a PCN reaction occurring within the last 10 years: No If all of the above answers are "NO", then may proceed with Cephalosporin use.   Eual Fines [Brassica Oleracea]     Negatively affects colostomy  . Sulfa Antibiotics Other (See Comments)    "Allergic," per MAR  . Tape Rash    Skin is sensitive; please use an alternative to tape    Review of Systems   Physical Exam   Vital Signs: BP 116/87 (BP Location: Left Arm)   Pulse (!) 103   Temp 97.8 F (36.6 C) (Oral)   Resp 18   Ht 5' 10"  (1.778 m)   Wt 109.5 kg   SpO2 98%   BMI 34.64 kg/m  Pain Scale: 0-10   Pain Score: 0-No pain   SpO2: SpO2: 98 % O2 Device:SpO2: 98 % O2 Flow Rate: .O2 Flow Rate (L/min): 1 L/min    Palliative Assessment/Data: 40%     Time In: 9:00 Time Out: 10:30 Time Total: 90 Visit consisted of counseling and education dealing with the complex and emotionally intense issues surrounding the need for palliative care and symptom  management in the setting of serious and potentially life-threatening illness. Greater than 50%  of this time was spent counseling and  coordinating care related to the above assessment and plan.  Signed by: Florentina Jenny, PA-C Palliative Medicine  Please contact Palliative Medicine Team phone at [phone] for questions and concerns.  For individual provider: See Shea Evans

## 2021-01-28 NOTE — Progress Notes (Signed)
   01/28/21 1225  Mobility  Activity Refused mobility (Declined d/t just getting back in bed)

## 2021-01-28 NOTE — Progress Notes (Signed)
Manufacturing engineer [facility])  Received request from [facility] for hospice services at [facility] after discharge.  Chart and pt information have been reviewed by [facility] physician.  Hospice eligibility confirmed.  [place] spoke with pt at bedside to initiate education related to hospice philosophy and services and to answer any questions at this time.  Mr. Schoffstall verbalized understanding of information given.  Per discussion the plan is to discharge home today by [facility]AR.  Pease send signed and completed DNR home with pt/family.  Please provide prescriptions at discharge as needed to ensure ongoing symptom management.    DME needs discussed.  NO DME needs at this time.  Pt would like bed changed out at some point but does not need to be done prior to discharge.    [facility] information and contact numbers given to paitent.  Above information shared with Leroy Kennedy, [facility] Manager.  Please call with any questions or concerns.  Thank you for the opportunity to participate in this pt's care.  Domenic Moras, BSN, RN [employer] [phone] (24h on call)

## 2021-01-28 NOTE — Discharge Instructions (Signed)

## 2021-01-28 NOTE — Discharge Summary (Addendum)
Physician Discharge Summary  [name] [name] AGT:[id number] DOB: Feb 05, 1933 DOA: 01/20/2021  PCP: [name], MD  Admit date: 01/20/2021 Discharge date: 01/29/2021  Admitted From: ALF Disposition: ALF with hospice  Recommendations for Outpatient Follow-up:  1. Follow up with PCP in 1-2 weeks 2. Please obtain BMP/CBC in one week your next doctors visit.  3. Amiodarone 200 mg daily 4. Lasix discontinued.  Torsemide 60 mg twice daily.  Potassium 20 M EQ twice daily 5. Follow-up patient cardiology 6. Toprol XL 74m po daily   Discharge Condition: Stable CODE STATUS: DNR Diet recommendation: Heart healthy  Brief/Interim Summary: 87-year with history of CAD, paroxysmal A. fib on Eliquis, CHF EF 40-45%, CKD stage IV, hypothyroidism, HTN, HLD, status post right upper quadrant ileostomy presented with worsening shortness of breath from ALF admitted for CHF exacerbation.  Cardiology team was consulted and started on IV diuretics.  Started on milrinone drip on 4/10 but did not tolerate this well.  Had frequent episodes of atrial fibrillation with RVR therefore started on amiodarone drip.  This was transitioned to p.o. amiodarone.  Seen by nephrology, who advised continuing diuresis as appropriate.  Patient was also seen by palliative care service due to multiple ongoing recurrent medical issues.  It was determined patient will be transitioned to ALF with hospice care.  Patient does not want to stay in the hospital anymore and would like to be discharged today. Family met with palliative care team.  Case discussed with cardiology, palliative care and nephrology team.  Acute congestive heart failure with reduced ejection fraction, class II.  EF 20-25% CAD s/p PCI DES 2015 -Repeated 2D echo done on 01/22/2021 revealed LVEF 20 to 25%, left ventricle demonstrate global hypokinesis. Moderate mitral valve regurgitation. -Did not tolerate milrinone well due to arrhythmia.  Not a candidate for beta-blocker and  ARB/ACE inhibitor -Symptomatically patient is feeling better.  Will transition diuretics to torsemide 60 mg twice daily with potassium supplements as suggested by cardiology team.  Paroxysmal A. fib with RVR -Continue Eliquis.  Transition patient to daily amiodarone 200 mg. Add Toprol 218mXL daily   AKI on CKD 4 suspect cardiorenal, currently stable Baseline creatinine 2.7.  Morning creatinine 2.87.  Closely monitor -Can follow-up outpatient.  Elevated troponin, suspect demand ischemia -From demand ischemia  Leukocytosis suspect reactive in the setting of acute illness -Resolved  Hyperlipidemia -Daily Lipitor  Type 2 diabetes with hyperglycemia Hemoglobin A1c6.6 on 01/21/2021 Resume home regimen  GERD Resume home regimen  Goals of care Patient seen by palliative care team, transition patient to ALF with hospice care.  Body mass index is 34.16 kg/m.         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  He wants to leave the hospital today with hospice care.  Does not want to stay in the hospital anymore.  Overall states he feels the best he could feel.  Discharge Exam: Vitals:  01/29/21 0140 01/29/21 0623  BP: 124/89 107/61  Pulse: 90 (!) 47  Resp: 14 16  Temp: 97.8 F (36.6 C)   SpO2: 99% 98%   Vitals:   01/28/21 2220 01/29/21 0140 01/29/21  0615 01/29/21 0623  BP: 90/67 124/89  107/61  Pulse: 94 90  (!) 47  Resp: 16 14  16   Temp: 97.8 F (36.6 C) 97.8 F (36.6 C)    TempSrc: Oral Oral    SpO2: 98% 99%  98%  Weight:   108 kg   Height:        General: Pt is alert, awake, not in acute distress Cardiovascular: RRR, S1/S2 +, no rubs, no gallops Respiratory: CTA bilaterally, no wheezing, no rhonchi Abdominal: Soft, NT, ND, bowel sounds + Extremities: no edema, no cyanosis  Discharge Instructions   Allergies as of 01/29/2021      Reactions   Brilinta [ticagrelor] Shortness Of Breath, Other (See Comments)   Changed to Plavix due to SOB   Clindamycin/lincomycin Anaphylaxis, Other (See Comments)   Whole body skin peeling, blisters also   Doxycycline Anaphylaxis   Penicillins Rash, Other (See Comments)   Has patient had a PCN reaction causing immediate rash, facial/tongue/throat swelling, SOB or lightheadedness with hypotension: Yes Has patient had a PCN reaction causing severe rash involving mucus membranes or skin necrosis: No Has patient had a PCN reaction that required hospitalization: No Has patient had a PCN reaction occurring within the last 10 years: No If all of the above answers are "NO", then may proceed with Cephalosporin use.   Broccoli [brassica Oleracea]    Negatively affects colostomy   Sulfa Antibiotics Other (See Comments)   "Allergic," per MAR   Tape Rash   Skin is sensitive; please use an alternative to tape      Medication List    STOP taking these medications   furosemide 20 MG tablet Commonly known as: LASIX     TAKE these medications   acetaminophen 325 MG tablet Commonly known as: TYLENOL Take 2 tablets (650 mg total) by mouth every 6 (six) hours as needed for mild pain, moderate pain or headache.   Alogliptin Benzoate 12.5 MG Tabs Take 6.25 mg by mouth daily.   amiodarone 200 MG tablet Commonly known as: PACERONE Take 200 mg by mouth daily.   atorvastatin 40 MG tablet Commonly  known as: LIPITOR Take 1 tablet (40 mg total) by mouth daily at 6 PM.   Eliquis 5 MG Tabs tablet Generic drug: apixaban Take 2.5 mg by mouth 2 (two) times daily.   FeroSul 325 (65 FE) MG tablet Generic drug: ferrous sulfate Take 325 mg by mouth daily. On Monday, Wednesday and Friday   glimepiride 2 MG tablet Commonly known as: AMARYL Take 1 mg by mouth 2 (two) times daily.   pantoprazole 40 MG tablet Commonly known as: PROTONIX Take 1 tablet (40 mg total) by mouth 2 (two) times daily.   potassium chloride SA 20 MEQ tablet Commonly known as: KLOR-CON Take 1 tablet (20 mEq total) by mouth 2 (two) times daily.   torsemide 20 MG tablet Commonly known as: Demadex Take 3 tablets (60 mg total) by mouth 2 (two) times daily.   Toujeo SoloStar 300 UNIT/ML Solostar Pen Generic drug: insulin glargine (1 Unit Dial) Inject 40 Units into the skin every morning.   Vitamin D (Cholecalciferol) 10 MCG (400 UNIT) Tabs Take 2 tablets by mouth daily.       Contact information for follow-up providers  Sueanne Margarita, MD. Go on 03/01/2021.   Specialty: Cardiology Why: @8 :20am for hospital follow up  Contact information: [address] [place] [address] [phone]        [name], MD. Daphane Shepherd on 02/05/2021.   Specialty: Internal Medicine Why: @9 :15am Contact information: [address] [place] [address] [phone]            Contact information for after-discharge care    Destination    [facility] .   Service: Group Home Contact information: [place] [phone]                 Allergies  Allergen Reactions  . Brilinta [Ticagrelor] Shortness Of Breath and Other (See Comments)    Changed to Plavix due to SOB  . Clindamycin/Lincomycin Anaphylaxis and Other (See Comments)    Whole body skin peeling, blisters also  . Doxycycline Anaphylaxis  . Penicillins Rash  and Other (See Comments)    Has patient had a PCN reaction causing immediate rash, facial/tongue/throat swelling, SOB or lightheadedness with hypotension: Yes Has patient had a PCN reaction causing severe rash involving mucus membranes or skin necrosis: No Has patient had a PCN reaction that required hospitalization: No Has patient had a PCN reaction occurring within the last 10 years: No If all of the above answers are "NO", then may proceed with Cephalosporin use.   Eual Fines [Brassica Oleracea]     Negatively affects colostomy  . Sulfa Antibiotics Other (See Comments)    "Allergic," per MAR  . Tape Rash    Skin is sensitive; please use an alternative to tape    You were cared for by a hospitalist during your hospital stay. If you have any questions about your discharge medications or the care you received while you were in the hospital after you are discharged, you can call the unit and asked to speak with the hospitalist on call if the hospitalist that took care of you is not available. Once you are discharged, your primary care physician will handle any further medical issues. Please note that no refills for any discharge medications will be authorized once you are discharged, as it is imperative that you return to your primary care physician (or establish a relationship with a primary care physician if you do not have one) for your aftercare needs so that they can reassess your need for medications and monitor your lab values.   Procedures/Studies: DG Chest 1 View  Result Date: 01/23/2021 CLINICAL DATA:  87 year old male with history of shortness of breath. EXAM: CHEST  1 VIEW COMPARISON:  Chest x-ray 01/21/2019. FINDINGS: Lung volumes are normal. Study is under penetrated, limiting the diagnostic sensitivity and specificity of the examination. With these limitations in mind, there are bibasilar opacities which are favored to be technique related or potentially from underlying subsegmental  atelectasis, however, airspace consolidation is difficult to entirely exclude, particularly in the left lower lobe. No pleural effusions. No pneumothorax. No suspicious appearing pulmonary nodules or masses are noted. Pulmonary vasculature is engorged, without frank pulmonary edema. Moderate cardiomegaly, similar to the prior study. Upper mediastinal contours are within normal limits. Aortic atherosclerosis. IMPRESSION: 1. Cardiomegaly with pulmonary venous congestion, but no frank pulmonary edema. 2. Probable subsegmental atelectasis in the lung bases. 3. Aortic atherosclerosis. Electronically Signed   By: Vinnie Langton M.D.   On: 01/23/2021 06:12   DG CHEST PORT 1 VIEW  Result Date: 01/26/2021 CLINICAL DATA:  Dyspnea EXAM: PORTABLE CHEST 1 VIEW COMPARISON:  01/23/2021 FINDINGS: Cardiac shadow remains enlarged. Aortic calcifications are again noted. Vascular congestion is again seen slightly increased from the prior exam with mild interstitial edema. No sizable effusion is noted. No acute bony abnormality is seen. IMPRESSION: Increased vascular congestion with mild interstitial edema. Electronically Signed   By: Inez Catalina M.D.   On: 01/26/2021 11:06   DG Chest Port 1 View  Result Date: 01/20/2021 CLINICAL DATA:  Shortness of breath EXAM: PORTABLE CHEST 1 VIEW COMPARISON:  July 26, 2018 FINDINGS: The heart size and mediastinal contours are mildly enlarged. There is prominence of the central pulmonary vasculature. No large airspace consolidation or pleural effusion. No acute osseous abnormality. IMPRESSION: Cardiomegaly and mild pulmonary vascular congestion. Electronically Signed   By: Prudencio Pair M.D.   On: 01/20/2021 17:16   ECHOCARDIOGRAM COMPLETE  Result Date: 01/22/2021    ECHOCARDIOGRAM REPORT   Patient Name:   [name] [name] Date of Exam: 01/22/2021 Medical Rec #:  [id number]     Height:       70.0 in Accession #:    [id number]    Weight:       248.2 lb Date of Birth:  06/26/1933     BSA:           2.288 m Patient Age:    47 years      BP:           108/73 mmHg Patient Gender: M             HR:           87 bpm. Exam Location:  Inpatient Procedure: 2D Echo, Cardiac Doppler, Color Doppler and Intracardiac            Opacification Agent Indications:    CHF-Acute Systolic K91.79  History:        Patient has prior history of Echocardiogram examinations, most                 recent 03/14/2018. CAD, Arrythmias:Atrial Fibrillation; Risk                 Factors:Hypertension and Diabetes. Chronic kidney disease.  Sonographer:    Darlina Sicilian RDCS Referring Phys: [place]  1. Left ventricular ejection fraction, by estimation, is 20 to 25%. The left ventricle has severely decreased function. The left ventricle demonstrates global hypokinesis. There is mild left ventricular hypertrophy. Left ventricular diastolic parameters  are indeterminate.  2. Right ventricular systolic function is moderately reduced. The right ventricular size is mildly enlarged.  3. The mitral valve is normal in structure. Moderate mitral valve regurgitation.  4. The aortic valve is tricuspid. Aortic valve regurgitation is trivial. Mild to moderate aortic valve sclerosis/calcification is present, without any evidence of aortic stenosis. FINDINGS  Left Ventricle: Left ventricular ejection fraction, by estimation, is 20 to 25%. The left ventricle has severely decreased function. The left ventricle demonstrates global hypokinesis. Definity contrast agent was given IV to delineate the left ventricular endocardial borders. The left ventricular internal cavity size was normal in size. There is mild left ventricular hypertrophy. Left ventricular diastolic parameters are indeterminate. Right Ventricle: The right ventricular size is mildly enlarged. Right vetricular wall thickness was not well visualized. Right ventricular systolic function is moderately reduced. Left Atrium: Left atrial size was normal in size. Right Atrium:  Right atrial size was normal in size. Pericardium: There is no evidence of  pericardial effusion. Mitral Valve: The mitral valve is normal in structure. Moderate mitral valve regurgitation. MV peak gradient, 5.3 mmHg. The mean mitral valve gradient is 3.0 mmHg. Tricuspid Valve: The tricuspid valve is normal in structure. Tricuspid valve regurgitation is mild. Aortic Valve: The aortic valve is tricuspid. Aortic valve regurgitation is trivial. Mild to moderate aortic valve sclerosis/calcification is present, without any evidence of aortic stenosis. Aortic valve mean gradient measures 3.0 mmHg. Aortic valve peak  gradient measures 5.4 mmHg. Aortic valve area, by VTI measures 2.40 cm. Pulmonic Valve: The pulmonic valve was not well visualized. Pulmonic valve regurgitation is mild. Aorta: The aortic root and ascending aorta are structurally normal, with no evidence of dilitation. IAS/Shunts: The interatrial septum was not well visualized.  LEFT VENTRICLE PLAX 2D LVIDd:         4.70 cm LVIDs:         4.40 cm LV PW:         1.10 cm LV IVS:        1.10 cm LVOT diam:     2.30 cm LV SV:         52 LV SV Index:   23 LVOT Area:     4.15 cm  LV Volumes (MOD) LV vol d, MOD A2C: 224.0 ml LV vol d, MOD A4C: 193.0 ml LV vol s, MOD A2C: 145.0 ml LV vol s, MOD A4C: 170.0 ml LV SV MOD A2C:     79.0 ml LV SV MOD A4C:     193.0 ml LV SV MOD BP:      40.9 ml RIGHT VENTRICLE RV S prime:     9.90 cm/s TAPSE (M-mode): 1.5 cm LEFT ATRIUM             Index       RIGHT ATRIUM           Index LA diam:        4.50 cm 1.97 cm/m  RA Area:     21.20 cm LA Vol (A2C):   61.4 ml 26.83 ml/m RA Volume:   65.00 ml  28.41 ml/m LA Vol (A4C):   55.6 ml 24.30 ml/m LA Biplane Vol: 59.5 ml 26.00 ml/m  AORTIC VALVE AV Area (Vmax):    2.70 cm AV Area (Vmean):   2.57 cm AV Area (VTI):     2.40 cm AV Vmax:           116.00 cm/s AV Vmean:          80.100 cm/s AV VTI:            0.216 m AV Peak Grad:      5.4 mmHg AV Mean Grad:      3.0 mmHg LVOT Vmax:          75.50 cm/s LVOT Vmean:        49.600 cm/s LVOT VTI:          0.125 m LVOT/AV VTI ratio: 0.58  AORTA Ao Root diam: 3.40 cm Ao Asc diam:  3.20 cm MITRAL VALVE                 TRICUSPID VALVE MV Area (PHT): 5.65 cm      TV Peak grad:   24.0 mmHg MV Area VTI:   1.98 cm MV Peak grad:  5.3 mmHg      TR Peak grad:   25.8 mmHg MV Mean grad:  3.0 mmHg      TR Vmax:  254.00 cm/s MV Vmax:       1.15 m/s MV Vmean:      87.6 cm/s     SHUNTS MV Decel Time: 134 msec      Systemic VTI:  0.12 m MR Peak grad:    60.5 mmHg   Systemic Diam: 2.30 cm MR Mean grad:    40.5 mmHg MR Vmax:         389.00 cm/s MR Vmean:        301.0 cm/s MR PISA:         1.57 cm MR PISA Eff ROA: 14 mm MR PISA Radius:  0.50 cm MV E velocity: 97.87 cm/s Oswaldo Milian MD Electronically signed by Oswaldo Milian MD Signature Date/Time: 01/22/2021/5:27:24 PM    Final    Color Fundus Photography Optos - OU - Both Eyes  Result Date: 01/12/2021 Right Eye Progression has been stable. Disc findings include normal observations. Macula : geographic atrophy, drusen. Vessels : normal observations. Periphery : normal observations. Left Eye Progression has been stable. Macula : geographic atrophy, drusen. Vessels : normal observations. Periphery : normal observations. Notes Incidental posterior vitreous detachment OU.  Observe    The results of significant diagnostics from this hospitalization (including imaging, microbiology, ancillary and laboratory) are listed below for reference.     Microbiology: Recent Results (from the past 240 hour(s))  SARS CORONAVIRUS 2 (TAT 6-24 HRS) Nasopharyngeal Nasopharyngeal Swab     Status: None   Collection Time: 01/20/21  4:35 PM   Specimen: Nasopharyngeal Swab  Result Value Ref Range Status   SARS Coronavirus 2 NEGATIVE NEGATIVE Final    Comment: (NOTE) SARS-CoV-2 target nucleic acids are NOT DETECTED.  The SARS-CoV-2 RNA is generally detectable in upper and lower respiratory specimens during  the acute phase of infection. Negative results do not preclude SARS-CoV-2 infection, do not rule out co-infections with other pathogens, and should not be used as the sole basis for treatment or other patient management decisions. Negative results must be combined with clinical observations, patient history, and epidemiological information. The expected result is Negative.  Fact Sheet for Patients: SugarRoll.be  Fact Sheet for Healthcare Providers: https://www.woods-mathews.com/  This test is not yet approved or cleared by the [place] FDA and  has been authorized for detection and/or diagnosis of SARS-CoV-2 by FDA under an Emergency Use Authorization (EUA). This EUA will remain  in effect (meaning this test can be used) for the duration of the COVID-19 declaration under Se ction 564(b)(1) of the Act, 21 U.S.C. section 360bbb-3(b)(1), unless the authorization is terminated or revoked sooner.  Performed at [facility], [place] [address] [place], [place] [address]   SARS CORONAVIRUS 2 (TAT 6-24 HRS) Nasopharyngeal Nasopharyngeal Swab     Status: None   Collection Time: 01/23/21  4:42 AM   Specimen: Nasopharyngeal Swab  Result Value Ref Range Status   SARS Coronavirus 2 NEGATIVE NEGATIVE Final    Comment: (NOTE) SARS-CoV-2 target nucleic acids are NOT DETECTED.  The SARS-CoV-2 RNA is generally detectable in upper and lower respiratory specimens during the acute phase of infection. Negative results do not preclude SARS-CoV-2 infection, do not rule out co-infections with other pathogens, and should not be used as the sole basis for treatment or other patient management decisions. Negative results must be combined with clinical observations, patient history, and epidemiological information. The expected result is Negative.  Fact Sheet for Patients: SugarRoll.be  Fact Sheet for Healthcare  Providers: https://www.woods-mathews.com/  This test is not yet  approved or cleared by the [place] and  has been authorized for detection and/or diagnosis of SARS-CoV-2 by FDA under an Emergency Use Authorization (EUA). This EUA will remain  in effect (meaning this test can be used) for the duration of the COVID-19 declaration under Se ction 564(b)(1) of the Act, 21 U.S.C. section 360bbb-3(b)(1), unless the authorization is terminated or revoked sooner.  Performed at [facility], [place] [address] [place], [place] [address]   Resp Panel by RT-PCR (Flu A&B, Covid) Nasopharyngeal Swab     Status: None   Collection Time: 01/28/21  5:38 PM   Specimen: Nasopharyngeal Swab; Nasopharyngeal(NP) swabs in vial transport medium  Result Value Ref Range Status   SARS Coronavirus 2 by RT PCR NEGATIVE NEGATIVE Final    Comment: (NOTE) SARS-CoV-2 target nucleic acids are NOT DETECTED.  The SARS-CoV-2 RNA is generally detectable in upper respiratory specimens during the acute phase of infection. The lowest concentration of SARS-CoV-2 viral copies this assay can detect is 138 copies/mL. A negative result does not preclude SARS-Cov-2 infection and should not be used as the sole basis for treatment or other patient management decisions. A negative result may occur with  improper specimen collection/handling, submission of specimen other than nasopharyngeal swab, presence of viral mutation(s) within the areas targeted by this assay, and inadequate number of viral copies(<138 copies/mL). A negative result must be combined with clinical observations, patient history, and epidemiological information. The expected result is Negative.  Fact Sheet for Patients:  EntrepreneurPulse.com.au  Fact Sheet for Healthcare Providers:  IncredibleEmployment.be  This test is no t yet approved or cleared by the [place] FDA and  has been authorized  for detection and/or diagnosis of SARS-CoV-2 by FDA under an Emergency Use Authorization (EUA). This EUA will remain  in effect (meaning this test can be used) for the duration of the COVID-19 declaration under Section 564(b)(1) of the Act, 21 U.S.C.section 360bbb-3(b)(1), unless the authorization is terminated  or revoked sooner.       Influenza A by PCR NEGATIVE NEGATIVE Final   Influenza B by PCR NEGATIVE NEGATIVE Final    Comment: (NOTE) The Xpert Xpress SARS-CoV-2/FLU/RSV plus assay is intended as an aid in the diagnosis of influenza from Nasopharyngeal swab specimens and should not be used as a sole basis for treatment. Nasal washings and aspirates are unacceptable for Xpert Xpress SARS-CoV-2/FLU/RSV testing.  Fact Sheet for Patients: EntrepreneurPulse.com.au  Fact Sheet for Healthcare Providers: IncredibleEmployment.be  This test is not yet approved or cleared by the [place] FDA and has been authorized for detection and/or diagnosis of SARS-CoV-2 by FDA under an Emergency Use Authorization (EUA). This EUA will remain in effect (meaning this test can be used) for the duration of the COVID-19 declaration under Section 564(b)(1) of the Act, 21 U.S.C. section 360bbb-3(b)(1), unless the authorization is terminated or revoked.  Performed at [facility], [place] [address], [place], [place] [address]      Labs: BNP (last 3 results) Recent Labs    01/20/21 1731 01/27/21 0532  BNP 705.9* 0,981.1*   Basic Metabolic Panel: Recent Labs  Lab 01/24/21 0218 01/25/21 0402 01/26/21 0235 01/26/21 1646 01/27/21 0532 01/28/21 0405 01/29/21 0341  NA 136 136 136  --  138 138 137  K 4.4 4.4 4.4  --  3.9 3.5 3.9  CL 106 108 106  --  107 102 102  CO2 23 20* 21*  --  23 24 25   GLUCOSE 122* 153*  154*  --  152* 166* 144*  BUN 79* 75* 74*  --  71* 69* 70*  CREATININE 3.11* 2.92* 2.88*  --  2.75* 2.87* 2.73*  CALCIUM 7.9* 8.1* 8.4*   --  8.4* 8.6* 8.4*  MG 1.9  --   --  2.0 1.9 1.8 1.7  PHOS 4.5  --  3.8  --  4.3  --   --    Liver Function Tests: Recent Labs  Lab 01/26/21 0235  ALBUMIN 2.7*   No results for input(s): LIPASE, AMYLASE in the last 168 hours. No results for input(s): AMMONIA in the last 168 hours. CBC: Recent Labs  Lab 01/24/21 0218 01/27/21 0532 01/28/21 0405 01/29/21 0341  WBC 11.3* 9.4 10.9* 9.8  NEUTROABS 8.4* 6.7  --   --   HGB 8.8* 9.7* 10.4* 10.3*  HCT 28.2* 31.6* 34.0* 33.5*  MCV 89.0 89.3 86.7 88.6  PLT 303 318 325 334   Cardiac Enzymes: No results for input(s): CKTOTAL, CKMB, CKMBINDEX, TROPONINI in the last 168 hours. BNP: Invalid input(s): POCBNP CBG: Recent Labs  Lab 01/28/21 0648 01/28/21 1123 01/28/21 1700 01/28/21 2221 01/29/21 0549  GLUCAP 154* 217* 114* 251* 162*   D-Dimer No results for input(s): DDIMER in the last 72 hours. Hgb A1c No results for input(s): HGBA1C in the last 72 hours. Lipid Profile No results for input(s): CHOL, HDL, LDLCALC, TRIG, CHOLHDL, LDLDIRECT in the last 72 hours. Thyroid function studies No results for input(s): TSH, T4TOTAL, T3FREE, THYROIDAB in the last 72 hours.  Invalid input(s): FREET3 Anemia work up No results for input(s): VITAMINB12, FOLATE, FERRITIN, TIBC, IRON, RETICCTPCT in the last 72 hours. Urinalysis    Component Value Date/Time   COLORURINE YELLOW 07/26/2018 1148   APPEARANCEUR HAZY (A) 07/26/2018 1148   LABSPEC 1.009 07/26/2018 1148   PHURINE 5.0 07/26/2018 1148   GLUCOSEU NEGATIVE 07/26/2018 1148   HGBUR LARGE (A) 07/26/2018 1148   BILIRUBINUR NEGATIVE 07/26/2018 1148   KETONESUR NEGATIVE 07/26/2018 1148   PROTEINUR NEGATIVE 07/26/2018 1148   UROBILINOGEN 0.2 10/22/2014 1052   NITRITE NEGATIVE 07/26/2018 1148   LEUKOCYTESUR LARGE (A) 07/26/2018 1148   Sepsis Labs Invalid input(s): PROCALCITONIN,  WBC,  LACTICIDVEN Microbiology Recent Results (from the past 240 hour(s))  SARS CORONAVIRUS 2 (TAT 6-24  HRS) Nasopharyngeal Nasopharyngeal Swab     Status: None   Collection Time: 01/20/21  4:35 PM   Specimen: Nasopharyngeal Swab  Result Value Ref Range Status   SARS Coronavirus 2 NEGATIVE NEGATIVE Final    Comment: (NOTE) SARS-CoV-2 target nucleic acids are NOT DETECTED.  The SARS-CoV-2 RNA is generally detectable in upper and lower respiratory specimens during the acute phase of infection. Negative results do not preclude SARS-CoV-2 infection, do not rule out co-infections with other pathogens, and should not be used as the sole basis for treatment or other patient management decisions. Negative results must be combined with clinical observations, patient history, and epidemiological information. The expected result is Negative.  Fact Sheet for Patients: SugarRoll.be  Fact Sheet for Healthcare Providers: https://www.woods-mathews.com/  This test is not yet approved or cleared by the [place] FDA and  has been authorized for detection and/or diagnosis of SARS-CoV-2 by FDA under an Emergency Use Authorization (EUA). This EUA will remain  in effect (meaning this test can be used) for the duration of the COVID-19 declaration under Se ction 564(b)(1) of the Act, 21 U.S.C. section 360bbb-3(b)(1), unless the authorization is terminated or revoked sooner.  Performed at [facility]  Lab, [address], [place], [place] [address]   SARS CORONAVIRUS 2 (TAT 6-24 HRS) Nasopharyngeal Nasopharyngeal Swab     Status: None   Collection Time: 01/23/21  4:42 AM   Specimen: Nasopharyngeal Swab  Result Value Ref Range Status   SARS Coronavirus 2 NEGATIVE NEGATIVE Final    Comment: (NOTE) SARS-CoV-2 target nucleic acids are NOT DETECTED.  The SARS-CoV-2 RNA is generally detectable in upper and lower respiratory specimens during the acute phase of infection. Negative results do not preclude SARS-CoV-2 infection, do not rule out co-infections with  other pathogens, and should not be used as the sole basis for treatment or other patient management decisions. Negative results must be combined with clinical observations, patient history, and epidemiological information. The expected result is Negative.  Fact Sheet for Patients: SugarRoll.be  Fact Sheet for Healthcare Providers: https://www.woods-mathews.com/  This test is not yet approved or cleared by the [place] FDA and  has been authorized for detection and/or diagnosis of SARS-CoV-2 by FDA under an Emergency Use Authorization (EUA). This EUA will remain  in effect (meaning this test can be used) for the duration of the COVID-19 declaration under Se ction 564(b)(1) of the Act, 21 U.S.C. section 360bbb-3(b)(1), unless the authorization is terminated or revoked sooner.  Performed at [facility], [place] [address] [place], [place] [address]   Resp Panel by RT-PCR (Flu A&B, Covid) Nasopharyngeal Swab     Status: None   Collection Time: 01/28/21  5:38 PM   Specimen: Nasopharyngeal Swab; Nasopharyngeal(NP) swabs in vial transport medium  Result Value Ref Range Status   SARS Coronavirus 2 by RT PCR NEGATIVE NEGATIVE Final    Comment: (NOTE) SARS-CoV-2 target nucleic acids are NOT DETECTED.  The SARS-CoV-2 RNA is generally detectable in upper respiratory specimens during the acute phase of infection. The lowest concentration of SARS-CoV-2 viral copies this assay can detect is 138 copies/mL. A negative result does not preclude SARS-Cov-2 infection and should not be used as the sole basis for treatment or other patient management decisions. A negative result may occur with  improper specimen collection/handling, submission of specimen other than nasopharyngeal swab, presence of viral mutation(s) within the areas targeted by this assay, and inadequate number of viral copies(<138 copies/mL). A negative result must be combined  with clinical observations, patient history, and epidemiological information. The expected result is Negative.  Fact Sheet for Patients:  EntrepreneurPulse.com.au  Fact Sheet for Healthcare Providers:  IncredibleEmployment.be  This test is no t yet approved or cleared by the [place] FDA and  has been authorized for detection and/or diagnosis of SARS-CoV-2 by FDA under an Emergency Use Authorization (EUA). This EUA will remain  in effect (meaning this test can be used) for the duration of the COVID-19 declaration under Section 564(b)(1) of the Act, 21 U.S.C.section 360bbb-3(b)(1), unless the authorization is terminated  or revoked sooner.       Influenza A by PCR NEGATIVE NEGATIVE Final   Influenza B by PCR NEGATIVE NEGATIVE Final    Comment: (NOTE) The Xpert Xpress SARS-CoV-2/FLU/RSV plus assay is intended as an aid in the diagnosis of influenza from Nasopharyngeal swab specimens and should not be used as a sole basis for treatment. Nasal washings and aspirates are unacceptable for Xpert Xpress SARS-CoV-2/FLU/RSV testing.  Fact Sheet for Patients: EntrepreneurPulse.com.au  Fact Sheet for Healthcare Providers: IncredibleEmployment.be  This test is not yet approved or cleared by the [place] FDA and has been authorized for detection and/or diagnosis of SARS-CoV-2  by FDA under an Emergency Use Authorization (EUA). This EUA will remain in effect (meaning this test can be used) for the duration of the COVID-19 declaration under Section 564(b)(1) of the Act, 21 U.S.C. section 360bbb-3(b)(1), unless the authorization is terminated or revoked.  Performed at [facility], [place] [address], [place], [place] [address]      Time coordinating discharge:  I have spent 35 minutes face to face with the patient and on the ward discussing the patients care, assessment, plan and disposition with other  care givers. >50% of the time was devoted counseling the patient about the risks and benefits of treatment/Discharge disposition and coordinating care.   SIGNED:   Damita Lack, MD  [facility] 01/29/2021, 10:41 AM   If 7PM-7AM, please contact night-coverage

## 2021-01-29 ENCOUNTER — Other Ambulatory Visit (HOSPITAL_COMMUNITY): Payer: Self-pay

## 2021-01-29 LAB — CBC
HCT: 33.5 % — ABNORMAL LOW (ref 39.0–52.0)
Hemoglobin: 10.3 g/dL — ABNORMAL LOW (ref 13.0–17.0)
MCH: 27.2 pg (ref 26.0–34.0)
MCHC: 30.7 g/dL (ref 30.0–36.0)
MCV: 88.6 fL (ref 80.0–100.0)
Platelets: 334 10*3/uL (ref 150–400)
RBC: 3.78 MIL/uL — ABNORMAL LOW (ref 4.22–5.81)
RDW: 14.5 % (ref 11.5–15.5)
WBC: 9.8 10*3/uL (ref 4.0–10.5)
nRBC: 0 % (ref 0.0–0.2)

## 2021-01-29 LAB — BASIC METABOLIC PANEL
Anion gap: 10 (ref 5–15)
BUN: 70 mg/dL — ABNORMAL HIGH (ref 8–23)
CO2: 25 mmol/L (ref 22–32)
Calcium: 8.4 mg/dL — ABNORMAL LOW (ref 8.9–10.3)
Chloride: 102 mmol/L (ref 98–111)
Creatinine, Ser: 2.73 mg/dL — ABNORMAL HIGH (ref 0.61–1.24)
GFR, Estimated: 22 mL/min — ABNORMAL LOW (ref >60)
Glucose, Bld: 144 mg/dL — ABNORMAL HIGH (ref 70–99)
Potassium: 3.9 mmol/L (ref 3.5–5.1)
Sodium: 137 mmol/L (ref 135–145)

## 2021-01-29 LAB — MAGNESIUM: Magnesium: 1.7 mg/dL (ref 1.7–2.4)

## 2021-01-29 LAB — GLUCOSE, CAPILLARY: Glucose-Capillary: 162 mg/dL — ABNORMAL HIGH (ref 70–99)

## 2021-01-29 MED ORDER — TORSEMIDE 20 MG PO TABS
60.0000 mg | ORAL_TABLET | Freq: Two times a day (BID) | ORAL | Status: DC
  Administered 2021-01-29: 60 mg via ORAL
  Filled 2021-01-29: qty 3

## 2021-01-29 MED ORDER — METOPROLOL SUCCINATE ER 25 MG PO TB24
25.0000 mg | ORAL_TABLET | Freq: Every day | ORAL | 0 refills | Status: AC
Start: 2021-01-29 — End: ?
  Filled 2021-01-29: qty 30, 30d supply, fill #0

## 2021-01-29 MED ORDER — MAGNESIUM OXIDE 400 (241.3 MG) MG PO TABS
800.0000 mg | ORAL_TABLET | Freq: Once | ORAL | Status: AC
  Administered 2021-01-29: 800 mg via ORAL
  Filled 2021-01-29: qty 2

## 2021-01-29 MED ORDER — METOPROLOL SUCCINATE ER 25 MG PO TB24: 25.0000 mg | ORAL_TABLET | Freq: Every day | ORAL | Status: DC

## 2021-01-29 NOTE — Care Management Important Message (Signed)
Important Message  Patient Details  Name: FORTUNATO NORDIN MRN: [id number] Date of Birth: [birth year]   Medicare Important Message Given:  Yes - Important Message mailed due to current National Emergency   Verbal consent obtained due to current National Emergency  Relationship to patient: Self Contact Name: Yousuf Call Date: 01/29/21  Time: 1107 Phone: [phone] Outcome: No Answer/Busy Important Message mailed to: Patient address on file     Delorse Lek 01/29/2021, 11:08 AM

## 2021-01-29 NOTE — TOC Progression Note (Signed)
Transition of Care [facility]) - Progression Note    Patient Details  Name: [name] [name] MRN: [id number] Date of Birth: 01-02-1933  [facility]) CM/SW Contact  Reece Agar, [place] Phone Number: 01/29/2021, 9:28 AM  Clinical Narrative:    9:25am- CSW contacted [facility] to follow up on DC for pt today, the facility has received the FL2, DC Summary, and Transfer note. Covid came back negative and they are ready for pt. CSW contacted pt sister [name] and called [facility]. Pt will be DC today.   Expected Discharge Plan: Assisted Living Barriers to Discharge: Continued Medical Work up  Expected Discharge Plan and Services Expected Discharge Plan: Assisted Living In-house Referral: Clinical Social Work Discharge Planning Services: Other - See comment (CSW) Post Acute Care Choice:  (ALF) Living arrangements for the past 2 months: [place] Expected Discharge Date: 01/29/21                                     Social Determinants of Health (SDOH) Interventions    Readmission Risk Interventions No flowsheet data found.

## 2021-01-29 NOTE — Progress Notes (Signed)
Discharge summary completed yesterday.  Patient seen and examined at bedside.  Patient is sitting up in the chair no complaints.  Medically stable to be discharged.  Discussed with cardiology team.  Toprol 25 mg XL added.  Please call with further questions as needed.  Gerlean Ren MD [facility]

## 2021-01-29 NOTE — Progress Notes (Signed)
Progress Note  Patient Name: [name] [name] Date of Encounter: 01/29/2021  [facility] Cardiologist: [name], MD   Subjective   Reports dyspnea improved  Inpatient Medications    Scheduled Meds: . ALPRAZolam  0.5 mg Oral QHS  . amiodarone  200 mg Oral BID  . apixaban  2.5 mg Oral BID  . insulin aspart  0-5 Units Subcutaneous QHS  . insulin aspart  0-9 Units Subcutaneous TID WC  . metoprolol succinate  25 mg Oral Daily  . sodium chloride flush  3 mL Intravenous Q12H  . torsemide  60 mg Oral BID  . traZODone  50 mg Oral QHS   Continuous Infusions: . sodium chloride     PRN Meds: sodium chloride, acetaminophen, ALPRAZolam, camphor-menthol, dextromethorphan-guaiFENesin, HYDROmorphone, ondansetron (ZOFRAN) IV, senna-docusate, sodium chloride flush   Vital Signs    Vitals:   01/28/21 2220 01/29/21 0140 01/29/21 0615 01/29/21 0623  BP: 90/67 124/89  107/61  Pulse: 94 90  (!) 47  Resp: 16 14  16   Temp: 97.8 F (36.6 C) 97.8 F (36.6 C)    TempSrc: Oral Oral    SpO2: 98% 99%  98%  Weight:   108 kg   Height:        Intake/Output Summary (Last 24 hours) at 01/29/2021 1102 Last data filed at 01/29/2021 0900 Gross per 24 hour  Intake 1020 ml  Output 2175 ml  Net -1155 ml   Last 3 Weights 01/29/2021 01/28/2021 01/27/2021  Weight (lbs) 238 lb 1.6 oz 241 lb 6.4 oz 245 lb 4.8 oz  Weight (kg) 108 kg 109.498 kg 111.267 kg      Telemetry    AF with rate 100-120s - Personally Reviewed  ECG    No new ECG- Personally Reviewed  Physical Exam   GEN: No acute distress.   Neck: + JVD Cardiac: irregular, tachycardic, no murmurs, rubs, or gallops.  Respiratory: Bibasilar crackles GI: Soft, nontender, non-distended  MS: Trace edema Neuro:  Nonfocal  Psych: Normal affect   Labs    High Sensitivity Troponin:   Recent Labs  Lab 01/20/21 1731 01/20/21 1926 01/23/21 0332  TROPONINIHS 20* 19* 22*      Chemistry Recent Labs  Lab 01/26/21 0235 01/27/21 0532  01/28/21 0405 01/29/21 0341  NA 136 138 138 137  K 4.4 3.9 3.5 3.9  CL 106 107 102 102  CO2 21* 23 24 25   GLUCOSE 154* 152* 166* 144*  BUN 74* 71* 69* 70*  CREATININE 2.88* 2.75* 2.87* 2.73*  CALCIUM 8.4* 8.4* 8.6* 8.4*  ALBUMIN 2.7*  --   --   --   GFRNONAA 21* 22* 21* 22*  ANIONGAP 9 8 12 10      Hematology Recent Labs  Lab 01/27/21 0532 01/28/21 0405 01/29/21 0341  WBC 9.4 10.9* 9.8  RBC 3.54* 3.92* 3.78*  HGB 9.7* 10.4* 10.3*  HCT 31.6* 34.0* 33.5*  MCV 89.3 86.7 88.6  MCH 27.4 26.5 27.2  MCHC 30.7 30.6 30.7  RDW 14.6 14.5 14.5  PLT 318 325 334    BNP Recent Labs  Lab 01/27/21 0532  BNP 1,614.4*     DDimer No results for input(s): DDIMER in the last 168 hours.   Radiology    No results found.  Cardiac Studies   Echo 01/22/21: 1. Left ventricular ejection fraction, by estimation, is 20 to 25%. The  left ventricle has severely decreased function. The left ventricle  demonstrates global hypokinesis. There is mild left ventricular  hypertrophy.  Left ventricular diastolic parameters  are indeterminate.  2. Right ventricular systolic function is moderately reduced. The right  ventricular size is mildly enlarged.  3. The mitral valve is normal in structure. Moderate mitral valve  regurgitation.  4. The aortic valve is tricuspid. Aortic valve regurgitation is trivial.  Mild to moderate aortic valve sclerosis/calcification is present, without  any evidence of aortic stenosis.   Patient Profile     87 y.o. male with a hx of CAD status post DES to the RI and CTO of the LAD,paroxysmal atrial fibrillation/flutter, chronic systolic anddiastolic CHF, CKD IV, HLD and hypertensionwho is being seen for the evaluation ofCHF and AFib RVR.   Assessment & Plan    Acute on chronic systolic heart failure, EF 20-25%: Admitted with volume overload and AKI.  BNP elevated.  Chest x-ray with pulmonary edema.  Echo with EF 20-25% (previously 40-45%. -Started on  empiric milrinone on 4/10 but reports felt worse with this and refused further milrinone.   -Given his CKD stage IV and poor functional status, he is not a candidate for invasive work-up of his cardiomyopathy -Responding to IV lasix 120 mg BID and Cr stable.  Would continue.  However patient is adamant about going home.  Palliative consulted, family meeting yesterday, plan to go back to his DNF with hospice.  Would discharge on torsemide 60 mg BID   Atrial fibrillation with RVR : Has had paroxysmal Afib  Suspect this is being driven by his congestive heart failure.  -Amiodarone was stopped on admission.  He is going in and out of AF, restarted amio to try and maintain sinus rhythm.  Can discharge on amio 200 mg daily -Continue Eliquis 2.5 mg twice daily.  This is an appropriate dose for his age and kidney function.  -Rates have been elevated, start toprol XL 25 mg daily   CAD : Cath in 2015 with CTO of the LAD.  Status post drug-eluting stent to the ramus.  -Troponins are flat negative.  No symptoms of angina.    Acute on chronic CKD: Now CKD stage IV.  Avoid nephrotoxic agents.   For questions or updates, please contact [place] Please consult [email or web address] for contact info under        Signed, Donato Heinz, MD  01/29/2021, 11:02 AM

## 2021-01-29 NOTE — TOC Transition Note (Signed)
Transition of Care [facility]) - CM/SW Discharge Note   Patient Details  Name: [name] [name] MRN: [id number] Date of Birth: 1933-01-04  Transition of Care [facility]) CM/SW Contact:  Tresa Endo Phone Number: 01/29/2021, 9:59 AM   Clinical Narrative:    Patient will DC to: [facility] Anticipated DC date: 01/29/2021 Family notified: Sister [name] Transport by: Corey Harold   Per MD patient ready for DC to [place] room 330. RN to call report prior to discharge [phone]). RN, patient, patient's family, and facility notified of DC. Discharge Summary and FL2 sent to facility. DC packet on chart. Ambulance transport requested for patient.   CSW will sign off for now as social work intervention is no longer needed. Please consult [place] again if new needs arise.     Final next level of care: Assisted Living Barriers to Discharge: Continued Medical Work up   Patient Goals and CMS Choice Patient states their goals for this hospitalization and ongoing recovery are:: Return to ALF      Discharge Placement                       Discharge Plan and Services In-house Referral: Clinical Social Work Discharge Planning Services: Other - See comment (CSW) Post Acute Care Choice:  (ALF)                               Social Determinants of Health (SDOH) Interventions     Readmission Risk Interventions No flowsheet data found.

## 2021-01-29 NOTE — Progress Notes (Signed)
  Mobility Specialist Criteria Algorithm Info.  Mobility Team: HOB elevated: Activity: Refused mobility (Declined d/t just getting back in bed) Range of motion: Active; All extremities Level of assistance: Minimal assist, patient does 75% or more Assistive device: Bedside Commode; Front wheel walker Minutes sitting in chair:  Minutes stood: 2 minutes Minutes ambulated: 2 minutes Distance ambulated (ft): 30 ft (15+15) Mobility response: Tolerated well Bed Position: Chair  Patient received in recliner chair requesting assistance to restroom. Declined further services from mobility stating he's getting discharged. Required minimal assist to boost from chair and ambulated in room to [facility] min A-min G. Patient was slightly SOB explaining that's his baseline. Tolerated ambulation well without complaint or incident.   01/29/2021 10:43 AM

## 2021-02-09 DIAGNOSIS — I5042 Chronic combined systolic (congestive) and diastolic (congestive) heart failure: Secondary | ICD-10-CM | POA: Diagnosis not present

## 2021-02-09 DIAGNOSIS — I5023 Acute on chronic systolic (congestive) heart failure: Secondary | ICD-10-CM | POA: Diagnosis not present

## 2021-02-09 DIAGNOSIS — N184 Chronic kidney disease, stage 4 (severe): Secondary | ICD-10-CM | POA: Diagnosis not present

## 2021-02-09 DIAGNOSIS — I4891 Unspecified atrial fibrillation: Secondary | ICD-10-CM | POA: Diagnosis not present

## 2021-02-12 DIAGNOSIS — Z20822 Contact with and (suspected) exposure to covid-19: Secondary | ICD-10-CM | POA: Diagnosis not present

## 2021-02-18 DIAGNOSIS — Z20822 Contact with and (suspected) exposure to covid-19: Secondary | ICD-10-CM | POA: Diagnosis not present

## 2021-02-24 DIAGNOSIS — Z20822 Contact with and (suspected) exposure to covid-19: Secondary | ICD-10-CM | POA: Diagnosis not present

## 2021-03-01 ENCOUNTER — Ambulatory Visit: Payer: Medicare Other | Admitting: Cardiology

## 2021-07-15 ENCOUNTER — Encounter (INDEPENDENT_AMBULATORY_CARE_PROVIDER_SITE_OTHER): Payer: Medicare Other | Admitting: Ophthalmology

## 2021-12-30 ENCOUNTER — Other Ambulatory Visit (HOSPITAL_COMMUNITY): Payer: Self-pay
